# Patient Record
Sex: Male | Born: 1935 | Race: White | Hispanic: No | Smoking: Never smoker
Health system: Southern US, Community
[De-identification: ages and names within clinical notes are randomized; demographics above are authoritative.]

## PROBLEM LIST (undated history)

## (undated) ENCOUNTER — Emergency Department (HOSPITAL_COMMUNITY): Discharge status: Against Medical Advice | Payer: Self-pay

## (undated) DIAGNOSIS — I10 Essential (primary) hypertension: Secondary | ICD-10-CM

## (undated) DIAGNOSIS — Z8619 Personal history of other infectious and parasitic diseases: Secondary | ICD-10-CM

## (undated) DIAGNOSIS — Z953 Presence of xenogenic heart valve: Secondary | ICD-10-CM

## (undated) DIAGNOSIS — K649 Unspecified hemorrhoids: Secondary | ICD-10-CM

## (undated) DIAGNOSIS — K219 Gastro-esophageal reflux disease without esophagitis: Secondary | ICD-10-CM

## (undated) DIAGNOSIS — N400 Enlarged prostate without lower urinary tract symptoms: Secondary | ICD-10-CM

## (undated) DIAGNOSIS — N183 Chronic kidney disease, stage 3 unspecified: Secondary | ICD-10-CM

## (undated) DIAGNOSIS — D649 Anemia, unspecified: Secondary | ICD-10-CM

## (undated) DIAGNOSIS — I371 Nonrheumatic pulmonary valve insufficiency: Secondary | ICD-10-CM

## (undated) DIAGNOSIS — I351 Nonrheumatic aortic (valve) insufficiency: Secondary | ICD-10-CM

## (undated) DIAGNOSIS — E785 Hyperlipidemia, unspecified: Secondary | ICD-10-CM

## (undated) DIAGNOSIS — I712 Thoracic aortic aneurysm, without rupture: Secondary | ICD-10-CM

## (undated) DIAGNOSIS — I7121 Aneurysm of the ascending aorta, without rupture: Secondary | ICD-10-CM

## (undated) DIAGNOSIS — I701 Atherosclerosis of renal artery: Secondary | ICD-10-CM

## (undated) DIAGNOSIS — Z951 Presence of aortocoronary bypass graft: Secondary | ICD-10-CM

## (undated) DIAGNOSIS — M129 Arthropathy, unspecified: Secondary | ICD-10-CM

## (undated) DIAGNOSIS — R600 Localized edema: Secondary | ICD-10-CM

## (undated) DIAGNOSIS — I340 Nonrheumatic mitral (valve) insufficiency: Secondary | ICD-10-CM

## (undated) DIAGNOSIS — K5792 Diverticulitis of intestine, part unspecified, without perforation or abscess without bleeding: Secondary | ICD-10-CM

## (undated) HISTORY — PX: SHOULDER SURGERY: SHX246

## (undated) HISTORY — PX: SHOULDER ARTHROSCOPY WITH ROTATOR CUFF REPAIR: SHX5685

## (undated) HISTORY — DX: Atherosclerosis of renal artery: I70.1

## (undated) HISTORY — DX: Aneurysm of the ascending aorta, without rupture: I71.21

## (undated) HISTORY — DX: Nonrheumatic mitral (valve) insufficiency: I34.0

## (undated) HISTORY — DX: Diverticulitis of intestine, part unspecified, without perforation or abscess without bleeding: K57.92

## (undated) HISTORY — DX: Chronic kidney disease, stage 3 (moderate): N18.3

## (undated) HISTORY — DX: Essential (primary) hypertension: I10

## (undated) HISTORY — DX: Chronic kidney disease, stage 3 unspecified: N18.30

## (undated) HISTORY — DX: Nonrheumatic aortic (valve) insufficiency: I35.1

## (undated) HISTORY — PX: ELBOW SURGERY: SHX618

## (undated) HISTORY — PX: OTHER SURGICAL HISTORY: SHX169

## (undated) HISTORY — DX: Personal history of other infectious and parasitic diseases: Z86.19

## (undated) HISTORY — DX: Gastro-esophageal reflux disease without esophagitis: K21.9

## (undated) HISTORY — DX: Localized edema: R60.0

## (undated) HISTORY — DX: Hyperlipidemia, unspecified: E78.5

## (undated) HISTORY — PX: CARDIAC CATHETERIZATION: SHX172

## (undated) HISTORY — DX: Anemia, unspecified: D64.9

## (undated) HISTORY — DX: Arthropathy, unspecified: M12.9

## (undated) HISTORY — DX: Thoracic aortic aneurysm, without rupture: I71.2

## (undated) HISTORY — DX: Nonrheumatic pulmonary valve insufficiency: I37.1

## (undated) HISTORY — DX: Benign prostatic hyperplasia without lower urinary tract symptoms: N40.0

## (undated) HISTORY — DX: Unspecified hemorrhoids: K64.9

---

## 2003-04-03 ENCOUNTER — Encounter: Admission: RE | Admit: 2003-04-03 | Discharge: 2003-04-03 | Payer: Self-pay | Admitting: Family Medicine

## 2003-08-28 ENCOUNTER — Encounter: Admission: RE | Admit: 2003-08-28 | Discharge: 2003-08-28 | Payer: Self-pay | Admitting: Family Medicine

## 2003-09-25 ENCOUNTER — Encounter: Admission: RE | Admit: 2003-09-25 | Discharge: 2003-09-25 | Payer: Self-pay | Admitting: Family Medicine

## 2004-08-26 ENCOUNTER — Ambulatory Visit: Payer: Self-pay | Admitting: Family Medicine

## 2005-08-23 ENCOUNTER — Ambulatory Visit: Payer: Self-pay | Admitting: Sports Medicine

## 2005-11-24 ENCOUNTER — Ambulatory Visit: Payer: Self-pay | Admitting: Family Medicine

## 2006-02-15 ENCOUNTER — Ambulatory Visit: Payer: Self-pay | Admitting: Family Medicine

## 2006-03-02 ENCOUNTER — Ambulatory Visit: Payer: Self-pay | Admitting: Sports Medicine

## 2006-06-06 ENCOUNTER — Ambulatory Visit: Payer: Self-pay | Admitting: Family Medicine

## 2006-10-25 ENCOUNTER — Ambulatory Visit: Payer: Self-pay | Admitting: Family Medicine

## 2006-11-01 ENCOUNTER — Ambulatory Visit: Payer: Self-pay | Admitting: Family Medicine

## 2006-12-30 ENCOUNTER — Ambulatory Visit: Payer: Self-pay | Admitting: Family Medicine

## 2007-02-02 DIAGNOSIS — I10 Essential (primary) hypertension: Secondary | ICD-10-CM | POA: Insufficient documentation

## 2007-02-07 ENCOUNTER — Telehealth: Payer: Self-pay | Admitting: *Deleted

## 2007-02-08 ENCOUNTER — Ambulatory Visit: Payer: Self-pay | Admitting: Family Medicine

## 2007-02-08 DIAGNOSIS — I10 Essential (primary) hypertension: Secondary | ICD-10-CM | POA: Insufficient documentation

## 2007-06-06 DIAGNOSIS — Z8619 Personal history of other infectious and parasitic diseases: Secondary | ICD-10-CM

## 2007-06-06 HISTORY — DX: Personal history of other infectious and parasitic diseases: Z86.19

## 2007-07-05 ENCOUNTER — Ambulatory Visit: Payer: Self-pay | Admitting: Gastroenterology

## 2008-09-20 ENCOUNTER — Encounter (INDEPENDENT_AMBULATORY_CARE_PROVIDER_SITE_OTHER): Payer: Self-pay | Admitting: *Deleted

## 2008-09-20 ENCOUNTER — Ambulatory Visit: Payer: Self-pay

## 2009-04-17 ENCOUNTER — Ambulatory Visit: Payer: Self-pay | Admitting: Unknown Physician Specialty

## 2009-07-18 ENCOUNTER — Ambulatory Visit: Payer: Self-pay | Admitting: Urology

## 2009-12-06 HISTORY — PX: COLONOSCOPY: SHX174

## 2010-06-17 ENCOUNTER — Ambulatory Visit: Payer: Self-pay | Admitting: Gastroenterology

## 2011-01-05 NOTE — Progress Notes (Signed)
Summary: RETURN CALL  Phone Note Call from Patient Call back at Work Phone [phone]   Reason for Call: Talk to Nurse Summary of Call: RETURNING [facility]'S CALL Initial call taken by: ERIN LEVAN,  February 07, 2007 4:08 PM

## 2011-01-05 NOTE — Assessment & Plan Note (Signed)
Summary: cough several weeks/ls   Vital Signs:  Patient Profile:   75 Years Old Male Weight:      167 pounds Temp:     96.7 degrees F Pulse rate:   67 / minute BP sitting:   170 / 86  Pt. in pain?   no  Vitals Entered By: [place] (February 08, 2007 10:36 AM)              Is Patient Diabetic? No   Acute Visit History:      The patient complains of cough, fever, musculoskeletal symptoms, nasal discharge, sinus problems, and sore throat.  These symptoms began 2 weeks ago.  He denies chest pain.  Other comments include: tried tylenol, rx cough med. unsure of name.        The cough interferes with his sleep.  The character of the cough is described as productive.        'Cold' or URI symptoms have been present with the sore throat.  There is no history of dysphagia, drooling, or recent exposure to strep.        He complains of ears being blocked and nasal congestion.           Past Medical History:    Reviewed history from 02/02/2007 and no changes required:       ? Supracondylar fracture as a child, hallux valgus, Right Rotator Cuff Repair-2003       Hypertension    Risk Factors:  Tobacco use:  never    Physical Exam  General:     Well-developed,well-nourished,in no acute distress; alert,appropriate and cooperative throughout examination Head:     Normocephalic and atraumatic without obvious abnormalities. No apparent alopecia or balding. Ears:     External ear exam shows no significant lesions or deformities.  Otoscopic examination reveals clear canals, tympanic membranes are intact bilaterally without bulging, retraction, inflammation or discharge. Hearing is grossly normal bilaterally. Nose:     no external deformity, no mucosal pallor, and nasal dischargemucosal pallor.   Mouth:     no exudates and pharyngeal erythema.   Neck:     No deformities, masses, or tenderness noted. Lungs:     Normal respiratory effort, chest expands symmetrically. Lungs  are clear to auscultation, no crackles or wheezes. Heart:     Normal rate and regular rhythm. S1 and S2 normal without gallop, murmur, click, rub or other extra sounds.    Impression & Recommendations:  Problem # 1:  INFLUENZA (ICD-487.1) pt likely with flu now haing lingering effects. since greater than 2 weeks will give supportive therapy. Abx for continued productive cough and cough suppressant Orders: [place]- Est Level  3 [id number])   Problem # 2:  HYPERTENSION (ICD-401.9) Assessment: Deteriorated poorly controlled last few visits.  Will add Norvasc 5mg .     Patient Instructions: 1)  Check your Blood Pressure regularly. If it is above: you should make an appointment. 2)  Get plenty of rest, drink lots of clear liquids, and use tylenol or Ibuprophen for fever and comfort. return in 7-10 days if you're not better:sooner if you're feeliong worse.

## 2011-01-05 NOTE — Progress Notes (Signed)
Summary: [employer] REQUEST  Phone Note Call from Patient Call back at Home Phone [phone] Call back at Work Phone [phone]   Reason for Call: Talk to Nurse Summary of Call: PT NEEDS [facility] Initial call taken by: ERIN LEVAN,  February 07, 2007 3:13 PM  Follow-up for Phone Call        Appt Scheduled 02/08/07 at 10:30

## 2011-01-05 NOTE — Miscellaneous (Signed)
Summary: Orders Update   Clinical Lists Changes  Orders: Added new Service order of No Charge Patient Arrived (NCPA0) (NCPA0) - Signed     Arrived the wrong pt, no charge

## 2011-09-30 ENCOUNTER — Ambulatory Visit: Payer: Self-pay | Admitting: Orthopedic Surgery

## 2014-06-20 DIAGNOSIS — I359 Nonrheumatic aortic valve disorder, unspecified: Secondary | ICD-10-CM | POA: Insufficient documentation

## 2016-10-26 ENCOUNTER — Ambulatory Visit (INDEPENDENT_AMBULATORY_CARE_PROVIDER_SITE_OTHER): Payer: Medicare Other | Admitting: Internal Medicine

## 2016-10-26 ENCOUNTER — Encounter: Payer: Self-pay | Admitting: Internal Medicine

## 2016-10-26 ENCOUNTER — Encounter (INDEPENDENT_AMBULATORY_CARE_PROVIDER_SITE_OTHER): Payer: Self-pay

## 2016-10-26 VITALS — BP 170/82 | HR 62 | Ht 67.0 in | Wt 182.8 lb

## 2016-10-26 DIAGNOSIS — I351 Nonrheumatic aortic (valve) insufficiency: Secondary | ICD-10-CM

## 2016-10-26 DIAGNOSIS — I38 Endocarditis, valve unspecified: Secondary | ICD-10-CM | POA: Diagnosis not present

## 2016-10-26 DIAGNOSIS — Z79899 Other long term (current) drug therapy: Secondary | ICD-10-CM | POA: Diagnosis not present

## 2016-10-26 DIAGNOSIS — I340 Nonrheumatic mitral (valve) insufficiency: Secondary | ICD-10-CM

## 2016-10-26 DIAGNOSIS — I10 Essential (primary) hypertension: Secondary | ICD-10-CM | POA: Diagnosis not present

## 2016-10-26 MED ORDER — ENALAPRIL MALEATE 20 MG PO TABS: 20.0000 mg | ORAL_TABLET | Freq: Two times a day (BID) | ORAL | 3 refills | Status: DC

## 2016-10-26 NOTE — Patient Instructions (Signed)
Medication Instructions:  Your physician has recommended you make the following change in your medication:  1- START taking your Enalapril 20mg  by mouth twice a day.   Labwork: Your physician recommends that you return for lab work in: [place] 1 WEEK ([place].)   Testing/Procedures: We will request echo results from your primary care physician.   Follow-Up: Your physician recommends that you schedule a follow-up appointment in: 3 MONTHS WITH DR END.   If you need a refill on your cardiac medications before your next appointment, please call your pharmacy.

## 2016-10-26 NOTE — Progress Notes (Signed)
New Outpatient Visit Date: 10/26/2016  Referring Provider: Vista Mink, FNP [facility]  Chief Complaint: Abnormal echocardiogram  HPI:  Rodney Wiley is a 80 y.o. year-old male with history of hypertension and aortic regurgitation followed by Dr. Ubaldo Glassing, who has been referred by Ms. Lavena Bullion for evaluation of aortic regurgitation.  The patient reports feeling well without chest pain, shortness of breath, palpitations, lightheadedness, orthopnea, and PND. The patient remain very active in and around his house.  He is able to mow his yard with a push mower without any difficulty, though he notes that he has slowed down gradually over the last 2-3 years. He has intermittent swelling of the left ankle when he has been on his feet for several hours.  He notes that he injured the left ankle many years ago and had issues with swelling for over a year after the injury.  He denies a history of DVT.  The patient was evaluated by Ms. Lavena Bullion this fall, at which time echo and carotid Dopplers were performed.  Echocardiogram was notable for LVH with LVEF 62%.  Some degree of mitral regurgitation was well as moderate to severe aortic insufficiency were noted (full report and images are not available).  He does not check his blood pressure regularly at home but reports being compliant with his antihypertensive regimen. He does not regularly check his blood pressure at home.  --------------------------------------------------------------------------------------------------  Cardiovascular History & Procedures: Cardiovascular Problems:  Aortic regurgitation  Mitral regurgitation  Risk Factors:  Hypertension, male gender, and age > 44  Cath/PCI:  None  CV Surgery:  None  EP Procedures and Devices:  None  Non-Invasive Evaluation(s):  Transthoracic echo (09/2016, [facility]): Full report not available.  Reportedly showed LVH with LVEF 41% and diastolic  dysfunction.  MR and moderate to severe AI present with sclerotic aortic valve.  Carotid Doppler (10/201&): Full report not available.  Reportedly showed 1-39% stenosis bilaterally.  Transthoracic echo (02/18/16, [facility]): Normal LV and RV size and function.  LVEF > 55%.  Moderate AI; mild MR and TR.  Recent CV Pertinent Labs: No results found for: CHOL, HDL, LDLCALC, LDLDIRECT, TRIG, CHOLHDL, INR, BNP, K, MG, BUN, CREATININE  --------------------------------------------------------------------------------------------------  Past Medical History:  Diagnosis Date  . Arthropathy   . Cardiac murmur   . Cardiomegaly   . Essential hypertension   . GERD (gastroesophageal reflux disease)   . Hemorrhoids   . Nonrheumatic aortic (valve) insufficiency   . Nonrheumatic mitral (valve) insufficiency   . Nonrheumatic pulmonary valve insufficiency     Past Surgical History:  Procedure Laterality Date  . ELBOW SURGERY    . hemorhoidectomy    . SHOULDER SURGERY      Outpatient Encounter Prescriptions as of 10/26/2016  Medication Sig  . amLODipine (NORVASC) 10 MG tablet Take 10 mg by mouth daily.   . Cholecalciferol (VITAMIN D3) 2000 units capsule Take 2,000 Units by mouth daily.   . enalapril (VASOTEC) 20 MG tablet Take 20 mg by mouth daily.  . ferrous sulfate 325 (65 FE) MG EC tablet Take 325 mg by mouth daily with breakfast.  . metoprolol succinate (TOPROL-XL) 100 MG 24 hr tablet Take 100 mg by mouth daily.   . naproxen (NAPRELAN) 500 MG 24 hr tablet Take 500 mg by mouth daily with breakfast.   . Omega-3 Fatty Acids (FISH OIL) 1200 MG CAPS Take by mouth daily.  . [DISCONTINUED] enalapril (VASOTEC) 20 MG tablet Take by mouth.   No facility-administered encounter  medications on file as of 10/26/2016.     Allergies: Patient has no known allergies.  Social History   Social History  . Marital status: Single    Spouse name: N/A  . Number of children: N/A  . Years of  education: N/A   Occupational History  . Not on file.   Social History Main Topics  . Smoking status: Never Smoker  . Smokeless tobacco: Never Used  . Alcohol use No  . Drug use: No  . Sexual activity: Not on file   Other Topics Concern  . Not on file   Social History Narrative  . No narrative on file    Family History  Problem Relation Age of Onset  . Hypertension Mother   . Heart attack Paternal Grandmother   . Stroke Paternal Grandfather   . Heart Problems Son   . Heart Problems Son     Review of Systems: A 12-system review of systems was performed and was negative except as noted in the HPI.  --------------------------------------------------------------------------------------------------  Physical Exam: BP (!) 146/74 (BP Location: Right Arm, Patient Position: Sitting, Cuff Size: Normal)   Pulse 62   Ht 5\' 7"  (1.702 m)   Wt 182 lb 12 oz (82.9 kg)   BMI 28.62 kg/m   Repeat BP 170/82  General:  Well-developed, well-nourished man, seated comfortably in the exam room. HEENT: No conjunctival pallor or scleral icterus.  Moist mucous membranes.  OP clear. Neck: Supple without lymphadenopathy, thyromegaly, JVD, or HJR.  No carotid bruit. Lungs: Normal work of breathing.  Clear to auscultation bilaterally without wheezes or crackles. Heart: Regular rate and rhythm with 2/6 holosystolic murmur loudest at the base and 2/6 early diastolic murmur loudest at the LUSB.  No rubs or gallops.  Non-displaced PMI. Abd: Bowel sounds present.  Soft, NT/ND without hepatosplenomegaly Ext: No lower extremity edema.  Radial, PT, and DP pulses are 2+ bilaterally Skin: warm and dry without rash Neuro: CNIII-XII intact.  Strength and fine-touch sensation intact in upper and lower extremities bilaterally. Psych: Normal mood and affect.  EKG:  Normal sinus rhythm with 1st degree AV block, left axis deviation, and poor R-wave progression.  Outside labs (09/24/16): TSH: 3.2  Lipid  panel: Total cholesterol 192, HDL 33, triglycerides 305, LDL 118  CMP: Sodium 140, potassium 4.1, chloride 106, CO2 23, BUN 23, creatinine 1.55, glucose 102, calcium 9.2, AST 32, ALT 38, alkaline phosphatase 44, total bilirubin 0.7, total protein 7.1, albumin 4.1  CBC White blood cell count 4.8, hemoglobin 13.4, hematocrit 39.0, platelets 157  --------------------------------------------------------------------------------------------------  ASSESSMENT AND PLAN: Valvular heart disease Patient has a history of mitral (mild) and aortic regurgitation (at least moderate), that has been followed for several years by Dr. Ubaldo Glassing.  Most recent echocardiogram last month performed at [facility] suggests potential progression of aortic regurgitation. However, formal report and images are not available for direct comparison. Mr. Boody appears euvolemic and well compensated at this time. We discussed the natural history of aortic and mitral regurgitation and the need for blood pressure control and periodic monitoring to evaluate for signs of developing left ventricular dysfunction/dilation. We will escalate his blood pressure regimen, as below, and request a copy of the report as well as the images from his recent echocardiogram at [facility]. If we are unable to obtain these, we may need to repeat an echocardiogram for the patient returns for follow-up.  Hypertension Blood pressure is mildly to moderately elevated today. In the setting of at  least moderate AI, it is important to maintain adequate blood pressure control. We will therefore increase enalapril from 20 mg daily to 20 mg twice a day. Recent outside labs were notable for some mild renal insufficiency; we will recheck a BMP today and in one week to ensure that his renal function and potassium have not worsened.  Follow-up: Return to clinic in 3 months.  Nelva Bush, MD 10/26/2016 10:58 AM

## 2016-10-27 ENCOUNTER — Other Ambulatory Visit: Payer: Self-pay | Admitting: *Deleted

## 2016-10-27 DIAGNOSIS — I340 Nonrheumatic mitral (valve) insufficiency: Secondary | ICD-10-CM | POA: Insufficient documentation

## 2016-10-27 DIAGNOSIS — I351 Nonrheumatic aortic (valve) insufficiency: Secondary | ICD-10-CM | POA: Insufficient documentation

## 2016-10-27 DIAGNOSIS — Z79899 Other long term (current) drug therapy: Secondary | ICD-10-CM

## 2016-10-27 DIAGNOSIS — R7989 Other specified abnormal findings of blood chemistry: Secondary | ICD-10-CM

## 2016-10-27 LAB — BASIC METABOLIC PANEL
BUN/Creatinine Ratio: 17 (ref 10–24)
BUN: 27 mg/dL (ref 8–27)
CO2: 21 mmol/L (ref 18–29)
Calcium: 9.2 mg/dL (ref 8.6–10.2)
Chloride: 102 mmol/L (ref 96–106)
Creatinine, Ser: 1.55 mg/dL — ABNORMAL HIGH (ref 0.76–1.27)
GFR calc Af Amer: 49 mL/min/{1.73_m2} — ABNORMAL LOW (ref >59)
GFR calc non Af Amer: 42 mL/min/{1.73_m2} — ABNORMAL LOW (ref >59)
Glucose: 108 mg/dL — ABNORMAL HIGH (ref 65–99)
Potassium: 4.1 mmol/L (ref 3.5–5.2)
Sodium: 140 mmol/L (ref 134–144)

## 2016-11-02 ENCOUNTER — Other Ambulatory Visit (INDEPENDENT_AMBULATORY_CARE_PROVIDER_SITE_OTHER): Payer: Medicare Other

## 2016-11-02 DIAGNOSIS — I10 Essential (primary) hypertension: Secondary | ICD-10-CM

## 2016-11-02 DIAGNOSIS — I351 Nonrheumatic aortic (valve) insufficiency: Secondary | ICD-10-CM | POA: Diagnosis not present

## 2016-11-03 LAB — BASIC METABOLIC PANEL
BUN/Creatinine Ratio: 21 (ref 10–24)
BUN: 33 mg/dL — ABNORMAL HIGH (ref 8–27)
CO2: 20 mmol/L (ref 18–29)
Calcium: 9 mg/dL (ref 8.6–10.2)
Chloride: 102 mmol/L (ref 96–106)
Creatinine, Ser: 1.54 mg/dL — ABNORMAL HIGH (ref 0.76–1.27)
GFR calc Af Amer: 49 mL/min/{1.73_m2} — ABNORMAL LOW (ref >59)
GFR calc non Af Amer: 43 mL/min/{1.73_m2} — ABNORMAL LOW (ref >59)
Glucose: 104 mg/dL — ABNORMAL HIGH (ref 65–99)
Potassium: 4.1 mmol/L (ref 3.5–5.2)
Sodium: 139 mmol/L (ref 134–144)

## 2016-11-15 ENCOUNTER — Encounter: Payer: Self-pay | Admitting: Internal Medicine

## 2016-11-16 ENCOUNTER — Telehealth: Payer: Self-pay | Admitting: *Deleted

## 2016-11-16 DIAGNOSIS — I351 Nonrheumatic aortic (valve) insufficiency: Secondary | ICD-10-CM

## 2016-11-16 DIAGNOSIS — I340 Nonrheumatic mitral (valve) insufficiency: Secondary | ICD-10-CM

## 2016-11-16 NOTE — Telephone Encounter (Signed)
Dr End reviewed records from Dr Malachy Mood Lindley's office.  We had requested CD copy of images of echocardiogram which their office was not able to provide or did not have.  Dr End would like patient to have a repeat echo at patient's earliest convenience.  Patient notified of this and agreeable to repeat echo. Call transferred to scheduler. Schedule for 12/13/16.

## 2016-12-14 ENCOUNTER — Other Ambulatory Visit: Payer: Self-pay

## 2016-12-14 ENCOUNTER — Ambulatory Visit (INDEPENDENT_AMBULATORY_CARE_PROVIDER_SITE_OTHER): Payer: Medicare HMO

## 2016-12-14 DIAGNOSIS — I351 Nonrheumatic aortic (valve) insufficiency: Secondary | ICD-10-CM

## 2016-12-14 DIAGNOSIS — I340 Nonrheumatic mitral (valve) insufficiency: Secondary | ICD-10-CM

## 2016-12-16 ENCOUNTER — Telehealth: Payer: Self-pay | Admitting: Internal Medicine

## 2016-12-16 DIAGNOSIS — I10 Essential (primary) hypertension: Secondary | ICD-10-CM

## 2016-12-16 DIAGNOSIS — N289 Disorder of kidney and ureter, unspecified: Secondary | ICD-10-CM

## 2016-12-16 NOTE — Telephone Encounter (Signed)
Echo shows mild to moderate AI but significant dilation of the ascending aorta (up to 4.9 cm above the sinotubular junction).  Patient reports feeling well.  We discussed further testing options and will obtain an MRA of the chest to further evaluate the aorta.  I prefer this over CTA due to his CKD.  I asked the patient to monitor his BP at home and to alert [place] if it is consistently above 140/90.  We will follow-up as planned in the office next month.  Nelva Bush, MD [facility] Pager: [phone]

## 2016-12-17 ENCOUNTER — Other Ambulatory Visit: Payer: Self-pay

## 2016-12-17 DIAGNOSIS — I712 Thoracic aortic aneurysm, without rupture, unspecified: Secondary | ICD-10-CM

## 2016-12-17 NOTE — Telephone Encounter (Signed)
Pt calling back to report his readings.  930 pm  150/85  830 am 165/80 Would also like to know if he needs to keep a record for over the weekend Please advise

## 2016-12-17 NOTE — Telephone Encounter (Signed)
S/w pt. He reports BPs listed below were taken 5 minutes after taking BP meds. Advised pt to check pressure 1 - 1.5 hours after medication to evaluate effectiveness. He will call Monday with today through Sunday readings. Pt agreeable to MRA of the chest January 22, arrival 1:45pm at [facility], Entrance A. He has someone that can go with him and understands to avoid a heavy meal that day.

## 2016-12-20 NOTE — Telephone Encounter (Signed)
Called to check on patient to ask about BP readings. Patient gave readings as follows: Friday 1/12 - 147/80  1/13 - 11 am- 139/69, 11 pm  160/85  1/14 - 10:15 am 149/89, 10 pm 200/90  1/15 - 182/91.  Patient denies h/a, blurred vision, chest pain, palpations, or dizziness. Patient stated "I really feel pretty good believe it or not." Patient stated he takes his metoprolol and amlodipine around 0730 daily. Takes his enalapril around 0730 and then at 6pm.  Will route to Dr End for advice.

## 2016-12-21 MED ORDER — CARVEDILOL 12.5 MG PO TABS: 12.5000 mg | ORAL_TABLET | Freq: Two times a day (BID) | ORAL | 3 refills | Status: DC

## 2016-12-21 NOTE — Telephone Encounter (Signed)
S/w patient. Gave him Dr Darnelle Bos advice. Patient verbalized understanding to stop metoprolol and start carvedilol 12.5 mg by mouth twice a day. He is aware someone from scheduling will call to schedule a renal ultrasound.  He will continue to monitor BP and call us if needed. Rx sent to patient's verified pharmacy.  Rx entered into EPIC x3 attempts and printed each time. [place] and they did not receive. Rx called in verbally to pharmacist.

## 2016-12-21 NOTE — Telephone Encounter (Signed)
Blood pressures are variable but overall still quite high. He is on maximum doses of amlodipine and enalapril already. Can we switch him from metoprolol to carvedilol 12.5 mg BID and also set him up for a renal artery Doppler to exclude renal artery stenosis (given his difficult to control BP and chronic kidney disease)? Thanks.  Gerald Stabs

## 2016-12-26 ENCOUNTER — Encounter: Payer: Self-pay | Admitting: Internal Medicine

## 2016-12-27 ENCOUNTER — Ambulatory Visit (HOSPITAL_COMMUNITY)
Admission: RE | Admit: 2016-12-27 | Discharge: 2016-12-27 | Disposition: A | Discharge status: Home / Self Care | Payer: Medicare HMO | Source: Ambulatory Visit | Attending: Internal Medicine | Admitting: Internal Medicine

## 2016-12-27 DIAGNOSIS — I712 Thoracic aortic aneurysm, without rupture, unspecified: Secondary | ICD-10-CM

## 2016-12-27 DIAGNOSIS — N189 Chronic kidney disease, unspecified: Secondary | ICD-10-CM | POA: Diagnosis not present

## 2016-12-28 ENCOUNTER — Telehealth: Payer: Self-pay | Admitting: Internal Medicine

## 2016-12-28 DIAGNOSIS — Z13220 Encounter for screening for lipoid disorders: Secondary | ICD-10-CM

## 2016-12-28 DIAGNOSIS — Z79899 Other long term (current) drug therapy: Secondary | ICD-10-CM

## 2016-12-28 NOTE — Telephone Encounter (Signed)
I spoke with Mr. Villalpando regarding the results of his thoracic MRA, which demonstrated moderate enlargement of the thoracic aorta (up to 4.9 cm above the sinotubular junction). He remains asymptomatic. He notes that his blood pressure over the last few days has been much improved ranging from 123-135/75-80. We have agreed to continue his current medications. He is scheduled to return for renal artery Doppler ultrasound on 01/13/17. We will also check a fasting lipid panel and a LT at that time in anticipation of adding statin therapy to hopefully help prevent progression of his TAA.  Nelva Bush, MD [facility] Pager: [phone]

## 2016-12-29 NOTE — Telephone Encounter (Signed)
S/w patient. He is aware to be fasting for blood work which we will draw here in the office immediately following his renal dopplers.

## 2017-01-13 ENCOUNTER — Telehealth: Payer: Self-pay | Admitting: *Deleted

## 2017-01-13 ENCOUNTER — Other Ambulatory Visit (INDEPENDENT_AMBULATORY_CARE_PROVIDER_SITE_OTHER): Payer: Medicare HMO | Admitting: *Deleted

## 2017-01-13 ENCOUNTER — Ambulatory Visit: Payer: Medicare HMO

## 2017-01-13 DIAGNOSIS — Z13220 Encounter for screening for lipoid disorders: Secondary | ICD-10-CM | POA: Diagnosis not present

## 2017-01-13 DIAGNOSIS — N289 Disorder of kidney and ureter, unspecified: Secondary | ICD-10-CM

## 2017-01-13 DIAGNOSIS — Z79899 Other long term (current) drug therapy: Secondary | ICD-10-CM | POA: Diagnosis not present

## 2017-01-13 DIAGNOSIS — I10 Essential (primary) hypertension: Secondary | ICD-10-CM

## 2017-01-13 NOTE — Telephone Encounter (Signed)
Patient was in the office this morning for lab work and renal ultrasound and brought by BP readings he recorded at home. Readings are as follows: 12/27/16 123/80 at 1030   135/75 at 8:30pm 12/29/16 146/76 at 1030am   142/76 at 11pm 12/30/16 126/70 at 0930am   162/72 at 8:30pm 12/31/16 121/78 at 11am   131/66 at 10pm 01/01/17 135/75 at 11am 01/02/17 125/65 at 10am   135/75 at 2030pm 01/03/17 138/78 at 10am 01/04/17 128/75 at 8:30pm 01/05/17 133/68 at 9:30am 01/06/17 127/72 at 10:30am 01/07/17 135/60 at 9:45am 01/08/17 136/76 at 10am 01/09/17 138/78 at 12pm 01/10/17 138/78 at 10am 01/11/17 128/60 at 07:30 am   S/w patient on the phone this afternoon. Advised that his readings were good readings. He does not have any complaints at this time to report. Let him know we will be in touch with results from testing today. Patient aware of next appt with Dr End on 01/26/17.

## 2017-01-14 LAB — LIPID PANEL
Chol/HDL Ratio: 6.2 ratio units — ABNORMAL HIGH (ref 0.0–5.0)
Cholesterol, Total: 186 mg/dL (ref 100–199)
HDL: 30 mg/dL — ABNORMAL LOW (ref >39)
LDL Calculated: 95 mg/dL (ref 0–99)
Triglycerides: 305 mg/dL — ABNORMAL HIGH (ref 0–149)
VLDL Cholesterol Cal: 61 mg/dL — ABNORMAL HIGH (ref 5–40)

## 2017-01-14 LAB — HEPATIC FUNCTION PANEL
ALT: 25 IU/L (ref 0–44)
AST: 19 IU/L (ref 0–40)
Albumin: 4.1 g/dL (ref 3.5–4.8)
Alkaline Phosphatase: 54 IU/L (ref 39–117)
Bilirubin Total: 0.7 mg/dL (ref 0.0–1.2)
Bilirubin, Direct: 0.19 mg/dL (ref 0.00–0.40)
Total Protein: 6.9 g/dL (ref 6.0–8.5)

## 2017-01-17 ENCOUNTER — Other Ambulatory Visit: Payer: Self-pay | Admitting: *Deleted

## 2017-01-17 MED ORDER — ATORVASTATIN CALCIUM 20 MG PO TABS: 20.0000 mg | ORAL_TABLET | Freq: Every day | ORAL | 3 refills | Status: DC

## 2017-01-26 ENCOUNTER — Encounter: Payer: Self-pay | Admitting: Internal Medicine

## 2017-01-26 ENCOUNTER — Ambulatory Visit (INDEPENDENT_AMBULATORY_CARE_PROVIDER_SITE_OTHER): Payer: Medicare HMO | Admitting: Internal Medicine

## 2017-01-26 VITALS — BP 130/62 | HR 58 | Ht 67.0 in | Wt 183.2 lb

## 2017-01-26 DIAGNOSIS — I701 Atherosclerosis of renal artery: Secondary | ICD-10-CM | POA: Diagnosis not present

## 2017-01-26 DIAGNOSIS — E785 Hyperlipidemia, unspecified: Secondary | ICD-10-CM | POA: Diagnosis not present

## 2017-01-26 DIAGNOSIS — I712 Thoracic aortic aneurysm, without rupture, unspecified: Secondary | ICD-10-CM

## 2017-01-26 DIAGNOSIS — Z79899 Other long term (current) drug therapy: Secondary | ICD-10-CM | POA: Diagnosis not present

## 2017-01-26 DIAGNOSIS — Z13220 Encounter for screening for lipoid disorders: Secondary | ICD-10-CM | POA: Diagnosis not present

## 2017-01-26 DIAGNOSIS — I10 Essential (primary) hypertension: Secondary | ICD-10-CM | POA: Diagnosis not present

## 2017-01-26 DIAGNOSIS — I351 Nonrheumatic aortic (valve) insufficiency: Secondary | ICD-10-CM

## 2017-01-26 MED ORDER — ASPIRIN EC 81 MG PO TBEC: 81.0000 mg | DELAYED_RELEASE_TABLET | Freq: Every day | ORAL | 3 refills | Status: DC

## 2017-01-26 MED ORDER — CARVEDILOL 25 MG PO TABS: 25.0000 mg | ORAL_TABLET | Freq: Two times a day (BID) | ORAL | 3 refills | Status: DC

## 2017-01-26 NOTE — Patient Instructions (Addendum)
Medication Instructions:  Your physician has recommended you make the following change in your medication:  1- STOP taking Metoprolol. 2- INCREASE Carvedilol to 25 mg by mouth twice a day. 3- START Aspirin 81 mg by mouth once a day.   Labwork: Your physician recommends that you return for lab work in: [place] ([place], [place]). - On or around March 09, 2017. - [facility] to the [facility]. You will check in at the front desk to the right as you walk into the atrium. Valet Parking is offered if needed. - YOU WILL NEED TO BE FASTING, DO NOT EAT OR DRINK AFTER MIDNIGHT THE MORNING OF THE LABS.   Testing/Procedures: none  Follow-Up: Your physician wants you to follow-up in: 4 MONTHS WITH DR END.  You will receive a reminder letter in the mail two months in advance. If you don't receive a letter, please call our office to schedule the follow-up appointment.   If you need a refill on your cardiac medications before your next appointment, please call your pharmacy.

## 2017-01-26 NOTE — Progress Notes (Signed)
Follow-up Outpatient Visit Date: 01/26/2017  Primary Care Provider: Vista Mink, FNP [place] [id number]  Chief Complaint: Follow-up TAA, aortic regurgitation, and HTN  HPI:  Rodney Wiley is a 81 y.o. year-old male with history of HTN, aortic regurgitation, thoracic aortic aneurysm, and chronic kidney disease who presents for follow-up of TAA, HTN, and AI. The patient reports feeling well, denying chest pain, shortness of breath, lightheadedness, and orthopnea. He noted mild dependent leg swelling, which has been stable. It has always been worse on the left due to a remote ankle injury. He was recently started on atorvastatin, which he is tolerating well. Review of his medications is notable for Rodney Wiley being on both carvedilol and metoprolol.  --------------------------------------------------------------------------------------------------  Cardiovascular History & Procedures: Cardiovascular Problems:  Thoracic aortic aneurysm  Aortic regurgitation  Mitral regurgitation  Renal artery stenosis  Risk Factors:  Hypertension, hyperlipidemia, male gender, and age > 71  Cath/PCI:  None  CV Surgery:  None  EP Procedures and Devices:  None  Non-Invasive Evaluation(s):  Renal artery Doppler (01/13/17): Aortic atherosclerosis without stenosis or aneurysm. Normal right renal artery. Mild to moderate left renal artery stenosis (1-59%). Normal/symmetric kidney size.  MRA chest (12/27/16): Aneurysmal disease of the ascending aorta, measuring 4.3 cm at the sinus of Valsalva and 4.9 cm above the sinotubular junction.  TTE (12/14/16): Mildly dilated LV with mild LVH. Normal contraction with LVEF 55-60%. Grade 1 diastolic dysfunction. Mild to moderate aortic regurgitation. Dilated aorta, measuring up to 4.9 cm above the sinotubular junction. Mild to moderate mitral regurgitation. Mild left atrial enlargement. Normal RV size and function.  Transthoracic echo  (09/2016, [place]): Full report not available.  Reportedly showed LVH with LVEF 65% and diastolic dysfunction.  MR and moderate to severe AI present with sclerotic aortic valve.  Carotid Doppler (10/201&): Full report not available.  Reportedly showed 1-39% stenosis bilaterally.  Transthoracic echo (02/18/16, [facility]): Normal LV and RV size and function.  LVEF > 55%.  Moderate AI; mild MR and TR.  Recent CV Pertinent Labs: Lab Results  Component Value Date   CHOL 186 01/13/2017   HDL 30 (L) 01/13/2017   LDLCALC 95 01/13/2017   TRIG 305 (H) 01/13/2017   CHOLHDL 6.2 (H) 01/13/2017   K 4.1 11/02/2016   BUN 33 (H) 11/02/2016   CREATININE 1.54 (H) 11/02/2016    Past medical and surgical history were reviewed and updated in EPIC.  Outpatient Encounter Prescriptions as of 01/26/2017  Medication Sig  . amLODipine (NORVASC) 10 MG tablet Take 10 mg by mouth daily.   [name] Kitchen atorvastatin (LIPITOR) 20 MG tablet Take 1 tablet (20 mg total) by mouth daily.  . carvedilol (COREG) 12.5 MG tablet Take 1 tablet (12.5 mg total) by mouth 2 (two) times daily.  . Cholecalciferol (VITAMIN D3) 2000 units capsule Take 2,000 Units by mouth daily.   . enalapril (VASOTEC) 20 MG tablet Take 1 tablet (20 mg total) by mouth 2 (two) times daily.  . ferrous sulfate 325 (65 FE) MG EC tablet Take 325 mg by mouth daily with breakfast.  . metoprolol succinate (TOPROL-XL) 100 MG 24 hr tablet Take 100 mg by mouth daily. Take with or immediately following a meal.  . naproxen (NAPRELAN) 500 MG 24 hr tablet Take 500 mg by mouth daily with breakfast.   . Omega-3 Fatty Acids (FISH OIL) 1200 MG CAPS Take by mouth daily.   No facility-administered encounter medications on file as of 01/26/2017.  Allergies: Patient has no known allergies.  Social History   Social History  . Marital status: Single    Spouse name: N/A  . Number of children: N/A  . Years of education: N/A   Occupational History  .  Not on file.   Social History Main Topics  . Smoking status: Never Smoker  . Smokeless tobacco: Never Used  . Alcohol use No  . Drug use: No  . Sexual activity: Not on file   Other Topics Concern  . Not on file   Social History Narrative  . No narrative on file    Family History  Problem Relation Age of Onset  . Hypertension Mother   . Leukemia Father   . Heart attack Paternal Grandmother   . Stroke Paternal Grandfather   . Heart Problems Son 59  . Heart Problems Son 58    Review of Systems: A 12-system review of systems was performed and was negative except as noted in the HPI.  --------------------------------------------------------------------------------------------------  Physical Exam: BP 130/62 (BP Location: Left Arm, Patient Position: Sitting, Cuff Size: Normal)   Pulse (!) 58   Ht 5\' 7"  (1.702 m)   Wt 183 lb 4 oz (83.1 kg)   BMI 28.70 kg/m   General:  Overweight, elderly man seated comfortably in the exam room. HEENT: No conjunctival pallor or scleral icterus.  Moist mucous membranes.  OP clear. Neck: Supple without lymphadenopathy, thyromegaly, JVD, or HJR.  No carotid bruit. Lungs: Normal work of breathing.  Clear to auscultation bilaterally without wheezes or crackles. Heart: Regular rate and rhythm with 2/6 holosystolic and 1/6 early diastolic murmurs. No rubs or gallops.  Non-displaced PMI. Abd: Bowel sounds present.  Soft, NT/ND without hepatosplenomegaly Ext: No lower extremity edema.  Radial, PT, and DP pulses are 2+ bilaterally. Skin: warm and dry without rash  No results found for: WBC, HGB, HCT, MCV, PLT  Lab Results  Component Value Date   NA 139 11/02/2016   K 4.1 11/02/2016   CL 102 11/02/2016   CO2 20 11/02/2016   BUN 33 (H) 11/02/2016   CREATININE 1.54 (H) 11/02/2016   GLUCOSE 104 (H) 11/02/2016   ALT 25 01/13/2017    Lab Results  Component Value Date   CHOL 186 01/13/2017   HDL 30 (L) 01/13/2017   LDLCALC 95 01/13/2017    TRIG 305 (H) 01/13/2017   CHOLHDL 6.2 (H) 01/13/2017    --------------------------------------------------------------------------------------------------  ASSESSMENT AND PLAN: Thoracic aortic aneurysm Thoracic aortic aneurysm noted by echo and confirmed by MRI, with ascending aorta measuring up to 4.9 cm. Patient is asymptomatic. We have agreed to close surveillance with aggressive blood pressure and lipid control. We will plan to repeat an MRI in late 06/2017 (6 months after previous study). If there is evidence of further enlargement, we will consider surgical referral to discuss repair.  Aortic regurgitation Mild to moderate AI noted by most recent echo. Exam notable for soft early diastolic murmur. The patient has chronic leg edema, which is stable. He otherwise appears euvolemic and well-compensated. We will continue with blood pressure control, as below.  Renal artery stenosis Mild to moderate stenosis of the left renal artery noted on recent Doppler. We will continue with medical management, as we would like to avoid IV contrast given history of CKD. I do not believe the his unilateral RAS would account for chronically elevated creatinine.  Hypertension Blood pressure under better control today. Review of medications today is notable for the patient being on two  beta-blockers. We discussed treatment options and have agreed to discontinue metoprolol and increase carvedilol to 25 mg BID.  Hyperlipidemia Recent lipid panel notable for LDL of 95. Given RAS, aortic atherosclerosis, and TAA, patient was recently started on moderate-intensity statin, which he is tolerating well. We will continue with atorvastatin 20 mg daily have repeat a lipid panel and ALT in about 6 weeks.  Follow-up: Return to clinic in 4 months  Nelva Bush, MD 01/26/2017 10:46 AM

## 2017-01-27 DIAGNOSIS — I701 Atherosclerosis of renal artery: Secondary | ICD-10-CM | POA: Insufficient documentation

## 2017-01-27 DIAGNOSIS — E785 Hyperlipidemia, unspecified: Secondary | ICD-10-CM | POA: Insufficient documentation

## 2017-01-27 DIAGNOSIS — I712 Thoracic aortic aneurysm, without rupture, unspecified: Secondary | ICD-10-CM | POA: Insufficient documentation

## 2017-02-21 ENCOUNTER — Encounter: Payer: Self-pay | Admitting: Internal Medicine

## 2017-02-22 ENCOUNTER — Telehealth: Payer: Self-pay | Admitting: *Deleted

## 2017-02-22 NOTE — Telephone Encounter (Signed)
Rodney Wiley  to [employer], MD       02/21/17 11:22 PM  I have had significant swelling in my feet, especially my left ankle and foot. I have never had this kind of swelling before. Elevation provided little or no relief. My blood pressure reading was 194/90. It has me concerned because these are new symptons. Do you think I need an office visit? I will take my readings again in the am. Thank you.   [name]

## 2017-02-22 NOTE — Telephone Encounter (Signed)
S/w patient.  We discussed avoiding salt intake. Patient stated he's been watching his salt intake for 20 years now and is very conscientious about avoiding salt as much as possible. He will continue to monitor BP and swelling and let us know if BP is consistently above 140/90 or leg swelling is worse than usual.

## 2017-02-22 NOTE — Telephone Encounter (Signed)
Received incoming call from patient. Last evening, patient took evening BP medications around 8 pm as usual.  Around 10:30pm his BP was 194/90.  He says the swelling in his feet and ankles was the most he had even had. Denies SOB, Chest pain, palpitations, dizziness last night or at all. This morning patient says his lower extremities are "almost normal" but still "puffy".  Routinely, his lower extremities will swell throughout the day and then go down at night. He elevates them in the evening and finds improvement. This morning BP 138/74 about 20 min after taking morning BP medications as listed on his med list. Advised for patient to monitor BP, HR, swelling, and additional symptoms and if he continues to see an increase in either to give [place] a call. Patient verbalized understanding.  Routing to Dr End for advice.

## 2017-02-22 NOTE — Telephone Encounter (Signed)
Given elevated blood pressure and increased swelling, I would be most concerned about increased salt intake. Please have [name] continue to check his BP and edema. If the BP is consistently above 140/90 or his leg swelling is worse than usual, he should let us know so that we can discuss medication adjustments. In the meantime, he should avoid salt as much as possible. Thanks.

## 2017-02-22 NOTE — Telephone Encounter (Signed)
Attempted to call patient. No answer. Left message for patient to call back to discuss email.

## 2017-03-03 ENCOUNTER — Ambulatory Visit (INDEPENDENT_AMBULATORY_CARE_PROVIDER_SITE_OTHER): Payer: Medicare HMO | Admitting: Internal Medicine

## 2017-03-03 ENCOUNTER — Encounter: Payer: Self-pay | Admitting: Internal Medicine

## 2017-03-03 VITALS — BP 144/72 | HR 56 | Ht 67.0 in | Wt 184.0 lb

## 2017-03-03 DIAGNOSIS — I712 Thoracic aortic aneurysm, without rupture, unspecified: Secondary | ICD-10-CM

## 2017-03-03 DIAGNOSIS — I340 Nonrheumatic mitral (valve) insufficiency: Secondary | ICD-10-CM

## 2017-03-03 DIAGNOSIS — M7989 Other specified soft tissue disorders: Secondary | ICD-10-CM | POA: Diagnosis not present

## 2017-03-03 DIAGNOSIS — I10 Essential (primary) hypertension: Secondary | ICD-10-CM | POA: Diagnosis not present

## 2017-03-03 DIAGNOSIS — I351 Nonrheumatic aortic (valve) insufficiency: Secondary | ICD-10-CM | POA: Diagnosis not present

## 2017-03-03 DIAGNOSIS — I701 Atherosclerosis of renal artery: Secondary | ICD-10-CM

## 2017-03-03 DIAGNOSIS — N183 Chronic kidney disease, stage 3 unspecified: Secondary | ICD-10-CM

## 2017-03-03 MED ORDER — FUROSEMIDE 40 MG PO TABS: 40.0000 mg | ORAL_TABLET | Freq: Every day | ORAL | 3 refills | Status: DC

## 2017-03-03 NOTE — Patient Instructions (Signed)
Medication Instructions:  Your physician has recommended you make the following change in your medication:  1- START Furosemide 40 mg (1 tablet) by mouth once a day.   Labwork: Your physician recommends that you return for lab work in: TODAY (BMP).   Testing/Procedures: none  Follow-Up: Your physician recommends that you schedule a follow-up appointment in: 2 WEEKS WITH DR END.   If you need a refill on your cardiac medications before your next appointment, please call your pharmacy.

## 2017-03-03 NOTE — Progress Notes (Addendum)
Follow-up Outpatient Visit Date: 03/03/2017  Primary Care Provider: Vista Mink, [place] [address]  Chief Complaint: Leg swelling  HPI:  Rodney Wiley is a 81 y.o. year-old male with history of HTN, aortic regurgitation, thoracic aortic aneurysm, and chronic kidney disease who presents for evaluation of leg swelling. I last saw him just over a month ago, at which time Rodney Wiley was doing well. However, about 3 weeks ago Rodney Wiley developed leg swelling while at the beach with his family. Rodney Wiley had been spending a lot of time working and was on his feet. Rodney Wiley also ate out several times, eating [employer] and fried seafood. Rodney Wiley typically follows a low-salt diet. Rodney Wiley has not changed any of his medications since that time. Rodney Wiley notes a gradual weight increase of about 7 pounds over the last several months. Rodney Wiley denies shortness of breath, orthopnea, PND, chest pain, palpitations, and lightheadedness. Rodney Wiley has been taking his medications as prescribed, including amlodipine. Home blood pressures have been upper normal to elevated ranging from 131-161/76-80. --------------------------------------------------------------------------------------------------  Cardiovascular History & Procedures: Cardiovascular Problems:  Thoracic aortic aneurysm  Aortic regurgitation  Mitral regurgitation  Renal artery stenosis  Risk Factors:  Hypertension, hyperlipidemia, male gender, and age > 51  Cath/PCI:  None  CV Surgery:  None  EP Procedures and Devices:  None  Non-Invasive Evaluation(s):  Renal artery Doppler (01/13/17): Aortic atherosclerosis without stenosis or aneurysm. Normal right renal artery. Mild to moderate left renal artery stenosis (1-59%). Normal/symmetric kidney size.  MRA chest (12/27/16): Aneurysmal disease of the ascending aorta, measuring 4.3 cm at the sinus of Valsalva and 4.9 cm above the sinotubular junction.  TTE (12/14/16): Mildly dilated LV with mild LVH.  Normal contraction with LVEF 55-60%. Grade 1 diastolic dysfunction. Mild to moderate aortic regurgitation. Dilated aorta, measuring up to 4.9 cm above the sinotubular junction. Mild to moderate mitral regurgitation. Mild left atrial enlargement. Normal RV size and function.  Transthoracic echo (09/2016, [place]): Full report not available.  Reportedly showed LVH with LVEF 66% and diastolic dysfunction.  MR and moderate to severe AI present with sclerotic aortic valve.  Carotid Doppler (10/201&): Full report not available.  Reportedly showed 1-39% stenosis bilaterally.  Transthoracic echo (02/18/16, [facility]): Normal LV and RV size and function.  LVEF > 55%.  Moderate AI; mild MR and TR.  Recent CV Pertinent Labs: Lab Results  Component Value Date   CHOL 186 01/13/2017   HDL 30 (L) 01/13/2017   LDLCALC 95 01/13/2017   TRIG 305 (H) 01/13/2017   CHOLHDL 6.2 (H) 01/13/2017   K 4.1 11/02/2016   BUN 33 (H) 11/02/2016   CREATININE 1.54 (H) 11/02/2016    Past medical and surgical history were reviewed and updated in EPIC.  Outpatient Encounter Prescriptions as of 03/03/2017  Medication Sig  . amLODipine (NORVASC) 10 MG tablet Take 10 mg by mouth daily.   [name] Kitchen aspirin EC 81 MG tablet Take 1 tablet (81 mg total) by mouth daily.  [name] Kitchen atorvastatin (LIPITOR) 20 MG tablet Take 1 tablet (20 mg total) by mouth daily.  . carvedilol (COREG) 25 MG tablet Take 1 tablet (25 mg total) by mouth 2 (two) times daily.  . Cholecalciferol (VITAMIN D3) 2000 units capsule Take 2,000 Units by mouth daily.   . enalapril (VASOTEC) 20 MG tablet Take 1 tablet (20 mg total) by mouth 2 (two) times daily.  . ferrous sulfate 325 (65 FE) MG EC tablet Take 325 mg by mouth daily with breakfast.  .  naproxen (NAPRELAN) 500 MG 24 hr tablet Take 500 mg by mouth daily with breakfast.   . Omega-3 Fatty Acids (FISH OIL) 1200 MG CAPS Take by mouth daily.   No facility-administered encounter medications on file  as of 03/03/2017.     Allergies: Patient has no known allergies.  Social History   Social History  . Marital status: Single    Spouse name: N/A  . Number of children: N/A  . Years of education: N/A   Occupational History  . Not on file.   Social History Main Topics  . Smoking status: Never Smoker  . Smokeless tobacco: Never Used  . Alcohol use No  . Drug use: No  . Sexual activity: Not on file   Other Topics Concern  . Not on file   Social History Narrative  . No narrative on file    Family History  Problem Relation Age of Onset  . Hypertension Mother   . Leukemia Father   . Heart attack Paternal Grandmother   . Stroke Paternal Grandfather   . Heart Problems Son 75  . Heart Problems Son 44    Review of Systems: A 12-system review of systems was performed and was negative except as noted in the HPI.  --------------------------------------------------------------------------------------------------  Physical Exam: BP (!) 144/72 (BP Location: Left Arm, Patient Position: Sitting, Cuff Size: Normal)   Pulse (!) 56   Ht 5\' 7"  (1.702 m)   Wt 184 lb (83.5 kg)   BMI 28.82 kg/m   General:  Overweight, elderly man seated comfortably in the exam room. Rodney Wiley is accompanied by his son. HEENT: No conjunctival pallor or scleral icterus.  Moist mucous membranes.  OP clear. Neck: Supple without lymphadenopathy, thyromegaly, JVD, or HJR. Lungs: Normal work of breathing.  Clear to auscultation bilaterally without wheezes or crackles. Heart: Regular rate and rhythm with 2/6 holosystolic and 1/6 early diastolic murmurs. No rubs or gallops.  Non-displaced PMI. Abd: Bowel sounds present.  Soft, NT/ND without hepatosplenomegaly Ext: 2+ pretibial edema to the mid calves.  Radial, PT, and DP pulses are 2+ bilaterally. Skin: warm and dry without rash  EKG: Sinus bradycardia with first-degree AV block (PR interval 214 ms). Poor R-wave progression in V1 through V3. Slight decrease in  heart rate since 10/26/16. Otherwise, no significant interval change (I have personally reviewed the tracings).  No results found for: WBC, HGB, HCT, MCV, PLT  Lab Results  Component Value Date   NA 139 11/02/2016   K 4.1 11/02/2016   CL 102 11/02/2016   CO2 20 11/02/2016   BUN 33 (H) 11/02/2016   CREATININE 1.54 (H) 11/02/2016   GLUCOSE 104 (H) 11/02/2016   ALT 25 01/13/2017    Lab Results  Component Value Date   CHOL 186 01/13/2017   HDL 30 (L) 01/13/2017   LDLCALC 95 01/13/2017   TRIG 305 (H) 01/13/2017   CHOLHDL 6.2 (H) 01/13/2017    --------------------------------------------------------------------------------------------------  ASSESSMENT AND PLAN: Lower extremity edema This has developed over the last 3 weeks. I suspect it is a combination of suboptimally controlled hypertension with hypertensive heart disease and aortic regurgitation, chronic kidney disease, increased salt intake, and amlodipine use. His weight is up 1 pound since our last visit. We have discussed further treatment options and have agreed to start furosemide 40 mg daily. Hopefully, we can avoid stopping amlodipine, as the patient's blood pressure has been somewhat difficult to control. We will check a basic metabolic panel today to ensure stable renal function.  Hypertension Blood pressure suboptimally controlled. As above, we will add furosemide 40 mg daily. Patient should continue the remainder of his home medications.  Renal artery stenosis and chronic kidney disease At least mild to moderate unilateral RAS was noted previously. If his blood pressure remains difficulty to control, we may need to evaluate this further, ideally with angiography. We will need to be careful, however, given his chronic kidney disease.  Aortic and mitral valve valve disease Given mild to moderate AI and MR, we need to be aggressive about controlling the patient's blood pressure and volume status. We will add furosemide,  as above.  Thoracic aortic aneurysm No symptoms. Plan for routine surveillance in a few months. We will continue with blood pressure control and statin therapy.  Follow-up: Return to clinic in 2 weeks.  Nelva Bush, MD 03/03/2017 2:20 PM

## 2017-03-04 LAB — BASIC METABOLIC PANEL
BUN/Creatinine Ratio: 20 (ref 10–24)
BUN: 28 mg/dL — ABNORMAL HIGH (ref 8–27)
CO2: 18 mmol/L (ref 18–29)
Calcium: 9.2 mg/dL (ref 8.6–10.2)
Chloride: 102 mmol/L (ref 96–106)
Creatinine, Ser: 1.42 mg/dL — ABNORMAL HIGH (ref 0.76–1.27)
GFR calc Af Amer: 54 mL/min/{1.73_m2} — ABNORMAL LOW (ref >59)
GFR calc non Af Amer: 47 mL/min/{1.73_m2} — ABNORMAL LOW (ref >59)
Glucose: 97 mg/dL (ref 65–99)
Potassium: 4.5 mmol/L (ref 3.5–5.2)
Sodium: 139 mmol/L (ref 134–144)

## 2017-03-11 ENCOUNTER — Telehealth: Payer: Self-pay | Admitting: Internal Medicine

## 2017-03-11 ENCOUNTER — Other Ambulatory Visit
Admission: RE | Admit: 2017-03-11 | Discharge: 2017-03-11 | Disposition: A | Discharge status: Home / Self Care | Payer: Medicare HMO | Source: Ambulatory Visit | Attending: Internal Medicine | Admitting: Internal Medicine

## 2017-03-11 DIAGNOSIS — I701 Atherosclerosis of renal artery: Secondary | ICD-10-CM | POA: Insufficient documentation

## 2017-03-11 DIAGNOSIS — E785 Hyperlipidemia, unspecified: Secondary | ICD-10-CM | POA: Diagnosis not present

## 2017-03-11 DIAGNOSIS — I10 Essential (primary) hypertension: Secondary | ICD-10-CM | POA: Insufficient documentation

## 2017-03-11 DIAGNOSIS — Z79899 Other long term (current) drug therapy: Secondary | ICD-10-CM

## 2017-03-11 LAB — COMPREHENSIVE METABOLIC PANEL
ALT: 19 U/L (ref 17–63)
AST: 24 U/L (ref 15–41)
Albumin: 4 g/dL (ref 3.5–5.0)
Alkaline Phosphatase: 56 U/L (ref 38–126)
Anion gap: 9 (ref 5–15)
BUN: 33 mg/dL — ABNORMAL HIGH (ref 6–20)
CO2: 26 mmol/L (ref 22–32)
Calcium: 8.8 mg/dL — ABNORMAL LOW (ref 8.9–10.3)
Chloride: 101 mmol/L (ref 101–111)
Creatinine, Ser: 1.84 mg/dL — ABNORMAL HIGH (ref 0.61–1.24)
GFR calc Af Amer: 39 mL/min — ABNORMAL LOW (ref >60)
GFR calc non Af Amer: 33 mL/min — ABNORMAL LOW (ref >60)
Glucose, Bld: 117 mg/dL — ABNORMAL HIGH (ref 65–99)
Potassium: 3.9 mmol/L (ref 3.5–5.1)
Sodium: 136 mmol/L (ref 135–145)
Total Bilirubin: 1.4 mg/dL — ABNORMAL HIGH (ref 0.3–1.2)
Total Protein: 7.3 g/dL (ref 6.5–8.1)

## 2017-03-11 LAB — LIPID PANEL
Cholesterol: 106 mg/dL (ref 0–200)
HDL: 28 mg/dL — ABNORMAL LOW (ref >40)
LDL Cholesterol: 39 mg/dL (ref 0–99)
Total CHOL/HDL Ratio: 3.8 RATIO
Triglycerides: 194 mg/dL — ABNORMAL HIGH (ref <150)
VLDL: 39 mg/dL (ref 0–40)

## 2017-03-11 NOTE — Telephone Encounter (Signed)
I spoke with Rodney Wiley regarding his labs from this morning. Creatinine noted to be up to 1.8 from 1.4 (baseline ~1.5). Leg swelling improved (almost gone this morning). Will cut furosemide back to 40 mg every other day and have patient increase water intake. He should continue to avoid salt. Will f/u as planned on 4/17.  Nelva Bush, MD [facility] Pager: [phone]

## 2017-03-22 ENCOUNTER — Ambulatory Visit (INDEPENDENT_AMBULATORY_CARE_PROVIDER_SITE_OTHER): Payer: Medicare HMO | Admitting: Internal Medicine

## 2017-03-22 ENCOUNTER — Encounter: Payer: Self-pay | Admitting: Internal Medicine

## 2017-03-22 VITALS — BP 152/68 | HR 59 | Ht 67.0 in | Wt 180.5 lb

## 2017-03-22 DIAGNOSIS — N1832 Chronic kidney disease, stage 3b: Secondary | ICD-10-CM | POA: Insufficient documentation

## 2017-03-22 DIAGNOSIS — I712 Thoracic aortic aneurysm, without rupture, unspecified: Secondary | ICD-10-CM

## 2017-03-22 DIAGNOSIS — N183 Chronic kidney disease, stage 3 unspecified: Secondary | ICD-10-CM

## 2017-03-22 DIAGNOSIS — I351 Nonrheumatic aortic (valve) insufficiency: Secondary | ICD-10-CM | POA: Diagnosis not present

## 2017-03-22 DIAGNOSIS — R600 Localized edema: Secondary | ICD-10-CM

## 2017-03-22 DIAGNOSIS — I10 Essential (primary) hypertension: Secondary | ICD-10-CM | POA: Diagnosis not present

## 2017-03-22 DIAGNOSIS — I701 Atherosclerosis of renal artery: Secondary | ICD-10-CM | POA: Diagnosis not present

## 2017-03-22 DIAGNOSIS — N189 Chronic kidney disease, unspecified: Secondary | ICD-10-CM

## 2017-03-22 DIAGNOSIS — I340 Nonrheumatic mitral (valve) insufficiency: Secondary | ICD-10-CM | POA: Diagnosis not present

## 2017-03-22 MED ORDER — AMLODIPINE BESYLATE 5 MG PO TABS: 5.0000 mg | ORAL_TABLET | Freq: Every day | ORAL | 3 refills | Status: DC

## 2017-03-22 MED ORDER — DOXAZOSIN MESYLATE 1 MG PO TABS: 1.0000 mg | ORAL_TABLET | Freq: Every day | ORAL | 3 refills | Status: DC

## 2017-03-22 NOTE — Progress Notes (Signed)
Follow-up Outpatient Visit Date: 03/22/2017  Primary Care Provider: Remi Haggard, [place] [address]  Chief Complaint: Leg swelling  HPI:  Rodney Wiley is a 81 y.o. year-old male with history of hypertension, aortic regurgitation, thoracic aortic aneurysm, and chronic kidney disease stage 3, who presents for evaluation of leg swelling. I last saw him on 03/03/17, at which time he noted considerable leg swelling. He had just returned from the beach, where he had eaten more salt than usual. We started furosemide, which the patient initially took daily and then began using every other day after a bump in his creatinine. He is now using it as needed, most recently 4 days ago. He notes that the swelling had completely resolved, though today there is mild puffiness about the ankles. He has not had chest pain, shortness of breath, palpitations, lightheadedness, or orthopnea. He remains on the remainder of his medications, including amlodipine, enalapril, and carvedilol. He reports his home blood pressures are typically 130s over 70s. He notes one brief episode of orthostatic lightheadedness while washing his car. Otherwise, he has not felt dizzy.  --------------------------------------------------------------------------------------------------  Cardiovascular History & Procedures: Cardiovascular Problems:  Thoracic aortic aneurysm  Aortic regurgitation  Mitral regurgitation  Renal artery stenosis  Risk Factors:  Hypertension, hyperlipidemia, male gender, and age > 80  Cath/PCI:  None  CV Surgery:  None  EP Procedures and Devices:  None  Non-Invasive Evaluation(s):  Renal artery Doppler (01/13/17): Aortic atherosclerosis without stenosis or aneurysm. Normal right renal artery. Mild to moderate left renal artery stenosis (1-59%). Normal/symmetric kidney size.  MRA chest (12/27/16): Aneurysmal disease of the ascending aorta, measuring 4.3 cm at the sinus  of Valsalva and 4.9 cm above the sinotubular junction.  TTE (12/14/16): Mildly dilated LV with mild LVH. Normal contraction with LVEF 55-60%. Grade 1 diastolic dysfunction. Mild to moderate aortic regurgitation. Dilated aorta, measuring up to 4.9 cm above the sinotubular junction. Mild to moderate mitral regurgitation. Mild left atrial enlargement. Normal RV size and function.  Transthoracic echo (09/2016, [facility]): Full report not available.  Reportedly showed LVH with LVEF 93% and diastolic dysfunction.  MR and moderate to severe AI present with sclerotic aortic valve.  Carotid Doppler (10/201&): Full report not available.  Reportedly showed 1-39% stenosis bilaterally.  Transthoracic echo (02/18/16, [facility]): Normal LV and RV size and function.  LVEF > 55%.  Moderate AI; mild MR and TR.  Recent CV Pertinent Labs: Lab Results  Component Value Date   CHOL 106 03/11/2017   CHOL 186 01/13/2017   HDL 28 (L) 03/11/2017   HDL 30 (L) 01/13/2017   LDLCALC 39 03/11/2017   LDLCALC 95 01/13/2017   TRIG 194 (H) 03/11/2017   CHOLHDL 3.8 03/11/2017   K 3.9 03/11/2017   BUN 33 (H) 03/11/2017   BUN 28 (H) 03/03/2017   CREATININE 1.84 (H) 03/11/2017    Past medical and surgical history were reviewed and updated in EPIC.  Outpatient Encounter Prescriptions as of 03/22/2017  Medication Sig  . amLODipine (NORVASC) 10 MG tablet Take 10 mg by mouth daily.   [name] Kitchen aspirin EC 81 MG tablet Take 1 tablet (81 mg total) by mouth daily.  [name] Kitchen atorvastatin (LIPITOR) 20 MG tablet Take 1 tablet (20 mg total) by mouth daily.  . carvedilol (COREG) 25 MG tablet Take 1 tablet (25 mg total) by mouth 2 (two) times daily.  . Cholecalciferol (VITAMIN D3) 2000 units capsule Take 2,000 Units by mouth daily.   [name] Kitchen  enalapril (VASOTEC) 20 MG tablet Take 1 tablet (20 mg total) by mouth 2 (two) times daily.  . ferrous sulfate 325 (65 FE) MG EC tablet Take 325 mg by mouth daily with breakfast.  . Omega-3  Fatty Acids (FISH OIL) 1200 MG CAPS Take by mouth daily.  . furosemide (LASIX) 40 MG tablet Take 1 tablet (40 mg total) by mouth daily. (Patient not taking: Reported on 03/22/2017)   No facility-administered encounter medications on file as of 03/22/2017.     Allergies: Patient has no known allergies.  Social History   Social History  . Marital status: Single    Spouse name: N/A  . Number of children: N/A  . Years of education: N/A   Occupational History  . Not on file.   Social History Main Topics  . Smoking status: Never Smoker  . Smokeless tobacco: Never Used  . Alcohol use No  . Drug use: No  . Sexual activity: Not on file   Other Topics Concern  . Not on file   Social History Narrative  . No narrative on file    Family History  Problem Relation Age of Onset  . Hypertension Mother   . Leukemia Father   . Heart attack Paternal Grandmother   . Stroke Paternal Grandfather   . Heart Problems Son 79  . Heart Problems Son 47    Review of Systems: A 12-system review of systems was performed and was negative except as noted in the HPI.  --------------------------------------------------------------------------------------------------  Physical Exam: BP (!) 152/68 (BP Location: Left Arm, Patient Position: Sitting, Cuff Size: Normal)   Pulse (!) 59   Ht 5\' 7"  (1.702 m)   Wt 180 lb 8 oz (81.9 kg)   BMI 28.27 kg/m   General:  Overweight, elderly man seated comfortably in the exam room. HEENT: No conjunctival pallor or scleral icterus.  Moist mucous membranes.  OP clear. Neck: Supple without lymphadenopathy, thyromegaly, JVD, or HJR. Lungs: Normal work of breathing.  Clear to auscultation bilaterally without wheezes or crackles. Heart: Regular rate and rhythm with 2/6 holosystolic Murmur loudest at the left lower sternal border. No rubs or gallops.  Non-displaced PMI. Abd: Bowel sounds present.  Soft, NT/ND without hepatosplenomegaly Ext: 1+ Ankle edema bilaterally,  improved from her last visit.  Radial, PT, and DP pulses are 2+ bilaterally. Skin: warm and dry without rash  No results found for: WBC, HGB, HCT, MCV, PLT  Lab Results  Component Value Date   NA 136 03/11/2017   K 3.9 03/11/2017   CL 101 03/11/2017   CO2 26 03/11/2017   BUN 33 (H) 03/11/2017   CREATININE 1.84 (H) 03/11/2017   GLUCOSE 117 (H) 03/11/2017   ALT 19 03/11/2017    Lab Results  Component Value Date   CHOL 106 03/11/2017   HDL 28 (L) 03/11/2017   LDLCALC 39 03/11/2017   TRIG 194 (H) 03/11/2017   CHOLHDL 3.8 03/11/2017    --------------------------------------------------------------------------------------------------  ASSESSMENT AND PLAN: Lower extremity edema Improved from our last visit. I suspect this is likely a combination of increased salt intake prior to our last visit and effects from amlodipine. We have agreed to decrease amlodipine to 5 mg daily. In its place, I will also add doxazosin 1 mg daily at bedtime (though edema may still be possible with this agent). If his renal function has improved to baseline, we could also consider spironolactone with close monitoring in the future. Mr. Ziomek can continue to use furosemide as needed for  weight gain or swelling. We will recheck a basic metabolic panel today due to acute on chronic kidney disease noted on our last check about 2 weeks ago.  Hypertension Blood pressure is modestly elevated today, though his home readings have been reasonable (130s over 70s). As above, we will decrease amlodipine and add doxazosin.  Renal artery stenosis and chronic kidney disease This was noted on renal artery Doppler, with mild to moderate disease involving the left renal artery. We discussed the potential influence of this on hypertension. We have agreed to continue with medical therapy and will defer further investigation with angiography at this time. Mr. Cappiello may benefit from nephrology consultation in the  future.  Aortic and mitral valve valve disease The patient appears euvolemic and well compensated with exception of mild ankle edema, improved from our last visit. We will continue with blood pressure control, as outlined above.  Thoracic aortic aneurysm The patient is asymptomatic today. We will plan for repeat cross-sectional imaging in about 3 months.  Follow-up: Return to clinic in 6 weeks.  Nelva Bush, MD 03/22/2017 8:32 PM

## 2017-03-22 NOTE — Patient Instructions (Addendum)
Medication Instructions:  Your physician has recommended you make the following change in your medication:   1- DECREASE Amlodipine to 5 mg by mouth once a day.  You may take 1/2 tablet of your Amlodipine 10 mg tablet to equal 5 mg until you pick up your new prescription.  2- START Doxazosin 1mg   (1 tablet) by mouth once a day at bedtime.   Labwork: Your physician recommends that you return for lab work in: TODAY (BMP).   Testing/Procedures: none  Follow-Up: Your physician recommends that you schedule a follow-up appointment in: [place].   If you need a refill on your cardiac medications before your next appointment, please call your pharmacy.

## 2017-03-23 LAB — BASIC METABOLIC PANEL
BUN/Creatinine Ratio: 23 (ref 10–24)
BUN: 27 mg/dL (ref 8–27)
CO2: 18 mmol/L (ref 18–29)
Calcium: 9.4 mg/dL (ref 8.6–10.2)
Chloride: 101 mmol/L (ref 96–106)
Creatinine, Ser: 1.19 mg/dL (ref 0.76–1.27)
GFR calc Af Amer: 67 mL/min/{1.73_m2} (ref >59)
GFR calc non Af Amer: 58 mL/min/{1.73_m2} — ABNORMAL LOW (ref >59)
Glucose: 89 mg/dL (ref 65–99)
Potassium: 4.7 mmol/L (ref 3.5–5.2)
Sodium: 139 mmol/L (ref 134–144)

## 2017-04-01 DIAGNOSIS — L570 Actinic keratosis: Secondary | ICD-10-CM | POA: Diagnosis not present

## 2017-04-01 DIAGNOSIS — L821 Other seborrheic keratosis: Secondary | ICD-10-CM | POA: Diagnosis not present

## 2017-05-05 ENCOUNTER — Ambulatory Visit: Payer: Medicare HMO | Admitting: Internal Medicine

## 2017-05-11 ENCOUNTER — Ambulatory Visit (INDEPENDENT_AMBULATORY_CARE_PROVIDER_SITE_OTHER): Payer: Medicare HMO | Admitting: Nurse Practitioner

## 2017-05-11 ENCOUNTER — Encounter: Payer: Self-pay | Admitting: Nurse Practitioner

## 2017-05-11 VITALS — BP 118/64 | HR 49 | Ht 62.0 in | Wt 179.5 lb

## 2017-05-11 DIAGNOSIS — I712 Thoracic aortic aneurysm, without rupture, unspecified: Secondary | ICD-10-CM

## 2017-05-11 DIAGNOSIS — I10 Essential (primary) hypertension: Secondary | ICD-10-CM

## 2017-05-11 DIAGNOSIS — I5032 Chronic diastolic (congestive) heart failure: Secondary | ICD-10-CM | POA: Diagnosis not present

## 2017-05-11 DIAGNOSIS — I38 Endocarditis, valve unspecified: Secondary | ICD-10-CM

## 2017-05-11 NOTE — Progress Notes (Signed)
Office Visit    Patient Name: Rodney Wiley Date of Encounter: 05/11/2017  Primary Care Provider:  Remi Haggard, FNP Primary Cardiologist:  Andree Coss, MD   Chief Complaint    81 year old male with a prior history of diastolic CHF, chronic lower extremity swelling, ascending aortic aneurysm, aortic insufficiency, mitral regurgitation, hypertension, GERD, and stage III chronic kidney disease, who presents for follow-up.  Past Medical History    Past Medical History:  Diagnosis Date  . Arthropathy   . Ascending aortic aneurysm ([place])    a. 12/2016 MRA: 4.3cm @ sinus of valsalva, 4.9cm above sinotubular jxn.  . Cardiac murmur   . Cardiomegaly   . CKD (chronic kidney disease), stage III   . Essential hypertension   . GERD (gastroesophageal reflux disease)   . Hemorrhoids   . Left renal artery stenosis ([facility])    a. 01/2017 RA u/s: moderate L RAS.  [name] Kitchen Lower extremity edema   . Nonrheumatic aortic (valve) insufficiency    a. 12/2016 Echo: mild to mod AI.  [name] Kitchen Nonrheumatic mitral (valve) insufficiency    a. 12/2016 Echo: mild to mod MR.  [name] Kitchen Nonrheumatic pulmonary valve insufficiency    Past Surgical History:  Procedure Laterality Date  . ELBOW SURGERY Left   . hemorhoidectomy    . SHOULDER SURGERY Right     Allergies  No Known Allergies  History of Present Illness    81 year old male with the above complex past medical history including  diastolic CHF, chronic lower extremity swelling, ascending aortic aneurysm, aortic insufficiency, mitral regurgitation, hypertension, GERD, and stage III chronic kidney disease. He was last seen in clinic in April at which point, he was taking Lasix as needed for lower extremity swelling. He says since then, he has required a few doses of Lasix each week. He doesn't think there is any rhyme or reason to when he might require dose. He doesn't necessarily wake himself on regular basis. He does avoid salt but also goes to a deli for lunch 3-4 times  per week. He did take 2 doses of Lasix this past weekend and also one dose this morning. He has not been having any chest pain. He occasionally notes dyspnea on exertion but then also says that he is able to push mow his yard for 30-40 minutes without stopping. He denies PND, orthopnea, dizziness, syncope, palpitations, or early satiety.  Home Medications    Prior to Admission medications   Medication Sig Start Date End Date Taking? Authorizing Provider  amLODipine (NORVASC) 5 MG tablet Take 1 tablet (5 mg total) by mouth daily. 03/22/17 06/20/17 Yes End, Harrell Gave, MD  aspirin EC 81 MG tablet Take 1 tablet (81 mg total) by mouth daily. 01/26/17  Yes End, Harrell Gave, MD  atorvastatin (LIPITOR) 20 MG tablet Take 1 tablet (20 mg total) by mouth daily. 01/17/17 05/11/17 Yes End, Harrell Gave, MD  carvedilol (COREG) 25 MG tablet Take 1 tablet (25 mg total) by mouth 2 (two) times daily. 01/26/17 05/11/17 Yes End, Harrell Gave, MD  Cholecalciferol (VITAMIN D3) 2000 units capsule Take 2,000 Units by mouth daily.    Yes [provider]  doxazosin (CARDURA) 1 MG tablet Take 1 tablet (1 mg total) by mouth at bedtime. 03/22/17  Yes End, Harrell Gave, MD  enalapril (VASOTEC) 20 MG tablet Take 1 tablet (20 mg total) by mouth 2 (two) times daily. 10/26/16  Yes End, Harrell Gave, MD  ferrous sulfate 325 (65 FE) MG EC tablet Take 325 mg by mouth daily  with breakfast.   Yes [provider]  furosemide (LASIX) 40 MG tablet Take 1 tablet (40 mg total) by mouth daily. 03/03/17 06/01/17 Yes End, Harrell Gave, MD    Review of Systems    As above, he does have some dyspnea on exertion but has pretty good exercise tolerance, push-mowing his yard for 30 minutes without stopping.  He has intermittent lower extremity swelling. He denies chest pain, palpitations, dyspnea, PND, orthopnea, dizziness, syncope, or early satiety. All other systems reviewed and are otherwise negative except as noted above.  Physical Exam      VS:  BP 118/64 (BP Location: Left Arm, Patient Position: Sitting, Cuff Size: Normal)   Pulse (!) 49   Ht 5\' 2"  (1.575 m)   Wt 179 lb 8 oz (81.4 kg)   BMI 32.83 kg/m  , BMI Body mass index is 32.83 kg/m. GEN: Well nourished, well developed, in no acute distress.  HEENT: normal.  Neck: Supple, no JVD, carotid bruits, or masses. Cardiac: RRR, 2/6 holosystolic murmur heard throughout but loudest at the bilateral upper sternal borders. I do not appreciate a diastolic murmur. No rubs, or gallops. No clubbing, cyanosis, 1+ bilateral lower extremity edema to the lower calf.  Radials/DP/PT 2+ and equal bilaterally.  Respiratory:  Respirations regular and unlabored, clear to auscultation bilaterally. GI: Soft, nontender, nondistended, BS + x 4. MS: no deformity or atrophy. Skin: warm and dry, no rash. Neuro:  Strength and sensation are intact. Psych: Normal affect.  Accessory Clinical Findings    ECG - Sinus bradycardia, 49, first-degree AV block, left axis deviation, poor R-wave progression noted acute changes.  Assessment & Plan    1.  Chronic diastolic congestive heart failure with lower extremity edema: Patient has been taking Lasix as needed. He took 2 doses over this past weekend and a dose this morning. He has 1+ bilateral lower extremity edema today. He is otherwise euvolemic. He has not been having any significant exercise intolerance, PND, orthopnea. Given prior history of stage III chronic kidney disease, I'm reluctant to escalate his home Lasix dose. I recommended instead that he wear thromboembolic stockings and effort to decompress his lower legs and hopefully improve edema. He also talked about avoidance of salt as he is going out and eating deli food about 3-4 days per week. His heart rate and blood pressure are stable today. He remains on beta blocker, amlodipine, and ACE inhibitor therapy.  2. Essential hypertension: Stable today.  3. Ascending aortic aneurysm: He will be due  for repeat MRA in July. I will arrange for this.  4. Valvular heart disease: This includes mild to moderate mitral regurgitation as well as mild to moderate aortic insufficiency. He has not been having any significant dyspnea or volume overload. Plan follow-up echo in 2019.  5. Stage III chronic kidney disease: Creatinine was 1.19 on April 17.  6. Disposition: Patient will begin wearing TED hose. Follow-up MRA in July. Follow-up in clinic in 3 months or sooner if necessary.   Murray Hodgkins, NP 05/11/2017, 4:36 PM

## 2017-05-11 NOTE — Patient Instructions (Addendum)
Medication Instructions:  Your physician recommends that you continue on your current medications as directed. Please refer to the Current Medication list given to you today.   Labwork: BMET at the [facility] outpatient lab. No appt  Necessary Please have this before your scheduled MRA.   Testing/Procedures: MRA chest without contrast. [facility] [place]. [place] [phone]  Wednesday, June 20 Arrival time 8:30am   Follow-Up: Your physician recommends that you schedule a follow-up appointment in: 3 months with Dr. Saunders Revel.    Any Other Special Instructions Will Be Listed Below (If Applicable).     If you need a refill on your cardiac medications before your next appointment, please call your pharmacy.

## 2017-05-18 ENCOUNTER — Ambulatory Visit: Payer: Medicare HMO

## 2017-05-19 ENCOUNTER — Other Ambulatory Visit
Admission: RE | Admit: 2017-05-19 | Discharge: 2017-05-19 | Disposition: A | Discharge status: Home / Self Care | Payer: Medicare HMO | Source: Ambulatory Visit | Attending: Nurse Practitioner | Admitting: Nurse Practitioner

## 2017-05-19 ENCOUNTER — Other Ambulatory Visit: Payer: Self-pay | Admitting: *Deleted

## 2017-05-19 DIAGNOSIS — I10 Essential (primary) hypertension: Secondary | ICD-10-CM

## 2017-05-19 DIAGNOSIS — Z5181 Encounter for therapeutic drug level monitoring: Secondary | ICD-10-CM

## 2017-05-19 DIAGNOSIS — Z79899 Other long term (current) drug therapy: Secondary | ICD-10-CM

## 2017-05-19 LAB — BASIC METABOLIC PANEL
Anion gap: 7 (ref 5–15)
BUN: 31 mg/dL — ABNORMAL HIGH (ref 6–20)
CO2: 24 mmol/L (ref 22–32)
Calcium: 9 mg/dL (ref 8.9–10.3)
Chloride: 106 mmol/L (ref 101–111)
Creatinine, Ser: 1.86 mg/dL — ABNORMAL HIGH (ref 0.61–1.24)
GFR calc Af Amer: 38 mL/min — ABNORMAL LOW (ref >60)
GFR calc non Af Amer: 33 mL/min — ABNORMAL LOW (ref >60)
Glucose, Bld: 110 mg/dL — ABNORMAL HIGH (ref 65–99)
Potassium: 4.1 mmol/L (ref 3.5–5.1)
Sodium: 137 mmol/L (ref 135–145)

## 2017-05-25 ENCOUNTER — Ambulatory Visit (HOSPITAL_COMMUNITY): Payer: Medicare HMO

## 2017-06-02 ENCOUNTER — Other Ambulatory Visit
Admission: RE | Admit: 2017-06-02 | Discharge: 2017-06-02 | Disposition: A | Discharge status: Home / Self Care | Payer: Medicare HMO | Source: Ambulatory Visit | Attending: Nurse Practitioner | Admitting: Nurse Practitioner

## 2017-06-02 DIAGNOSIS — I10 Essential (primary) hypertension: Secondary | ICD-10-CM | POA: Diagnosis not present

## 2017-06-02 DIAGNOSIS — Z79899 Other long term (current) drug therapy: Secondary | ICD-10-CM | POA: Diagnosis not present

## 2017-06-02 DIAGNOSIS — Z5181 Encounter for therapeutic drug level monitoring: Secondary | ICD-10-CM | POA: Diagnosis not present

## 2017-06-02 LAB — BASIC METABOLIC PANEL
Anion gap: 6 (ref 5–15)
BUN: 33 mg/dL — ABNORMAL HIGH (ref 6–20)
CO2: 22 mmol/L (ref 22–32)
Calcium: 8.8 mg/dL — ABNORMAL LOW (ref 8.9–10.3)
Chloride: 108 mmol/L (ref 101–111)
Creatinine, Ser: 1.68 mg/dL — ABNORMAL HIGH (ref 0.61–1.24)
GFR calc Af Amer: 43 mL/min — ABNORMAL LOW (ref >60)
GFR calc non Af Amer: 37 mL/min — ABNORMAL LOW (ref >60)
Glucose, Bld: 120 mg/dL — ABNORMAL HIGH (ref 65–99)
Potassium: 4 mmol/L (ref 3.5–5.1)
Sodium: 136 mmol/L (ref 135–145)

## 2017-06-06 ENCOUNTER — Ambulatory Visit (HOSPITAL_COMMUNITY)
Admission: RE | Admit: 2017-06-06 | Discharge: 2017-06-06 | Disposition: A | Discharge status: Home / Self Care | Payer: Medicare HMO | Source: Ambulatory Visit | Attending: Nurse Practitioner | Admitting: Nurse Practitioner

## 2017-06-06 DIAGNOSIS — I712 Thoracic aortic aneurysm, without rupture, unspecified: Secondary | ICD-10-CM

## 2017-08-14 NOTE — Progress Notes (Signed)
Follow-up Outpatient Visit Date: 08/16/2017  Primary Care Provider: Remi Haggard, [place] [address]  Chief Complaint: Elevated blood pressure  HPI:  [name] is a 81 y.o. year-old male with history of hypertension, aortic regurgitation, thoracic aortic aneurysm, and chronic kidney disease stage 3, who presents for follow-up of hypertension and thoracic aortic aneurysm. He was last seen by Ignacia Bayley, NP, in early June. At that time, [name] was doing well. He was using furosemide as needed a few days a week. Blood pressure was well-controlled.  Today, [name] reports that his blood pressure has not been very well-controlled over the last 2-3 weeks. It has ranged anywhere from 165-195/47-84. He has not had any changes in his medication or diet (including salt consumption). He notes a single episode of orthostatic lightheadedness last week. He has otherwise been asymptomatic, denying chest pain, shortness of breath, palpitations, orthopnea, PND, and edema. He has only been using furosemide sporadically for leg swelling, having last taken this 7-10 days ago. He has tolerated low-dose doxazosin that was started last visit.  --------------------------------------------------------------------------------------------------  Cardiovascular History & Procedures: Cardiovascular Problems:  Thoracic aortic aneurysm  Aortic regurgitation  Mitral regurgitation  Renal artery stenosis  Risk Factors:  Hypertension, hyperlipidemia, male gender, and age > 42  Cath/PCI:  None  CV Surgery:  None  EP Procedures and Devices:  None  Non-Invasive Evaluation(s):  MRA chest (06/06/17): Stable ascending aortic aneurysm, measuring 5.0 cm at the sinotubular junction and 4.8 cm at the level of the main pulmonary artery.  Renal artery Doppler (01/13/17): Aortic atherosclerosis without stenosis or aneurysm. Normal right renal artery. Mild to moderate left  renal artery stenosis (1-59%). Normal/symmetric kidney size.  MRA chest (12/27/16): Aneurysmal disease of the ascending aorta, measuring 4.3 cm at the sinus of Valsalva and 4.9 cm above the sinotubular junction.  TTE (12/14/16): Mildly dilated LV with mild LVH. Normal contraction with LVEF 55-60%. Grade 1 diastolic dysfunction. Mild to moderate aortic regurgitation. Dilated aorta, measuring up to 4.9 cm above the sinotubular junction. Mild to moderate mitral regurgitation. Mild left atrial enlargement. Normal RV size and function.  Transthoracic echo (09/2016, [place]): Full report not available.  Reportedly showed LVH with LVEF 90% and diastolic dysfunction.  MR and moderate to severe AI present with sclerotic aortic valve.  Carotid Doppler (10/201&): Full report not available.  Reportedly showed 1-39% stenosis bilaterally.  Transthoracic echo (02/18/16, [facility]): Normal LV and RV size and function.  LVEF > 55%.  Moderate AI; mild MR and TR.  Recent CV Pertinent Labs: Lab Results  Component Value Date   CHOL 106 03/11/2017   CHOL 186 01/13/2017   HDL 28 (L) 03/11/2017   HDL 30 (L) 01/13/2017   LDLCALC 39 03/11/2017   LDLCALC 95 01/13/2017   TRIG 194 (H) 03/11/2017   CHOLHDL 3.8 03/11/2017   K 4.0 06/02/2017   BUN 33 (H) 06/02/2017   BUN 27 03/22/2017   CREATININE 1.68 (H) 06/02/2017   Past medical and surgical history were reviewed and updated in EPIC.  Outpatient Encounter Prescriptions as of 08/16/2017  Medication Sig  . amLODipine (NORVASC) 5 MG tablet Take 1 tablet (5 mg total) by mouth daily.  [name] Kitchen aspirin EC 81 MG tablet Take 1 tablet (81 mg total) by mouth daily.  [name] Kitchen atorvastatin (LIPITOR) 20 MG tablet Take 1 tablet (20 mg total) by mouth daily.  . carvedilol (COREG) 25 MG tablet Take 1 tablet (25 mg total) by  mouth 2 (two) times daily.  . Cholecalciferol (VITAMIN D3) 2000 units capsule Take 2,000 Units by mouth daily.   [name] Kitchen doxazosin (CARDURA) 1 MG  tablet Take 1 tablet (1 mg total) by mouth at bedtime.  . enalapril (VASOTEC) 20 MG tablet Take 1 tablet (20 mg total) by mouth 2 (two) times daily.  . ferrous sulfate 325 (65 FE) MG EC tablet Take 325 mg by mouth daily with breakfast.  . furosemide (LASIX) 40 MG tablet Take 1 tablet (40 mg total) by mouth daily.   No facility-administered encounter medications on file as of 08/16/2017.     Allergies: Patient has no known allergies.  Social History   Social History  . Marital status: Single    Spouse name: N/A  . Number of children: N/A  . Years of education: N/A   Occupational History  . Not on file.   Social History Main Topics  . Smoking status: Never Smoker  . Smokeless tobacco: Never Used  . Alcohol use No  . Drug use: No  . Sexual activity: Not on file   Other Topics Concern  . Not on file   Social History Narrative  . No narrative on file    Family History  Problem Relation Age of Onset  . Hypertension Mother   . Leukemia Father   . Heart attack Paternal Grandmother   . Stroke Paternal Grandfather   . Heart Problems Son 74  . Heart Problems Son 8    Review of Systems: Review of Systems  Constitutional: Negative.   HENT: Negative.   Eyes: Negative.   Respiratory: Negative.   Cardiovascular: Positive for leg swelling (well-controlled with prn furosemide). Negative for chest pain, palpitations and orthopnea.  Gastrointestinal: Negative.   Genitourinary: Negative.   Musculoskeletal: Negative.   Skin: Negative.   Neurological: Positive for dizziness (Single episode of lightheadedness last week).  Endo/Heme/Allergies: Negative.   Psychiatric/Behavioral: Negative.    --------------------------------------------------------------------------------------------------  Physical Exam: BP (!) 190/82 (BP Location: Left Arm, Patient Position: Sitting, Cuff Size: Normal)   Pulse (!) 53   Ht 5\' 7"  (1.702 m)   Wt 175 lb 12 oz (79.7 kg)   BMI 27.53 kg/m    General:  Overweight elderly man, seated comfortably in the exam room. HEENT: No conjunctival pallor or scleral icterus.  Moist mucous membranes.  OP clear. Neck: Supple without lymphadenopathy, thyromegaly, JVD, or HJR. Lungs: Normal work of breathing.  Clear to auscultation bilaterally without wheezes or crackles. Heart: Bradycardic but regular rhythm with 2/6 systolic and early diastolic murmur loudest at the right upper sternal border. No rubs or gallops. Abd: Bowel sounds present.  Soft, NT/ND without hepatosplenomegaly Ext: No lower extremity edema.  Radial, PT, and DP pulses are 2+ bilaterally. Skin: Warm and dry without rash.  No results found for: WBC, HGB, HCT, MCV, PLT  Lab Results  Component Value Date   NA 136 06/02/2017   K 4.0 06/02/2017   CL 108 06/02/2017   CO2 22 06/02/2017   BUN 33 (H) 06/02/2017   CREATININE 1.68 (H) 06/02/2017   GLUCOSE 120 (H) 06/02/2017   ALT 19 03/11/2017    Lab Results  Component Value Date   CHOL 106 03/11/2017   HDL 28 (L) 03/11/2017   LDLCALC 39 03/11/2017   TRIG 194 (H) 03/11/2017   CHOLHDL 3.8 03/11/2017    --------------------------------------------------------------------------------------------------  ASSESSMENT AND PLAN: Hypertension Pressure is poorly controlled today, consistent with what [name] has experienced over the last 2 weeks. Interestingly,  his blood pressure was great at his last visit in June. I am not sure what precipitated this change. We reviewed the importance of sodium restriction. We will retry escalation of amlodipine to 10 mg daily (some lower extremity edema noted in the past, though this developed after he had been on amlodipine for several months). I have also asked [name] to increase doxazosin to 2 mg daily at bedtime. We will have him return for a blood pressure check in 2 weeks. Depending on his blood pressure response, we may also need to consider sleep study and reevaluation of his renal  artery stenosis as well as assess serum aldosterone and plasma renin activity.  Renal artery stenosis Mild to moderate left renal artery stenosis noted in February by Doppler study. If blood pressure remains difficult to control, we may need to repeat this study to evaluate for progression or consider angiography. However, I would like to minimize/avoid contrast exposure in the setting of chronic kidney disease.  Thoracic aortic aneurysm MRA in July shows relatively stable enlargement of the ascending aortic aneurysm. Given that it measures 5.0 cm just above the sinotubular junction, I will refer [name] to cardiac surgery for further management. We will continue with blood pressure control, as outlined above, and statin therapy.  Aortic and mitral regurgitation Systolic and diastolic murmurs appreciated on exam today consistent with mild to moderate aortic and mitral regurgitation seen on last years ago. Poorly controlled blood pressure is likely exacerbating regurgitation. We will increase antihypertensive regimen, as above. [name] is not have any symptoms of heart failure at this time and should continue to use furosemide as needed. We will repeat an echocardiogram in January.  Follow-up: Return to clinic for blood pressure check in 2 weeks. Return to see me in one month.  Nelva Bush, MD 08/16/2017 2:25 PM

## 2017-08-16 ENCOUNTER — Encounter: Payer: Self-pay | Admitting: Internal Medicine

## 2017-08-16 ENCOUNTER — Ambulatory Visit (INDEPENDENT_AMBULATORY_CARE_PROVIDER_SITE_OTHER): Payer: Medicare HMO | Admitting: Internal Medicine

## 2017-08-16 VITALS — BP 190/82 | HR 53 | Ht 67.0 in | Wt 175.8 lb

## 2017-08-16 DIAGNOSIS — I340 Nonrheumatic mitral (valve) insufficiency: Secondary | ICD-10-CM | POA: Diagnosis not present

## 2017-08-16 DIAGNOSIS — I712 Thoracic aortic aneurysm, without rupture, unspecified: Secondary | ICD-10-CM

## 2017-08-16 DIAGNOSIS — I701 Atherosclerosis of renal artery: Secondary | ICD-10-CM | POA: Diagnosis not present

## 2017-08-16 DIAGNOSIS — I351 Nonrheumatic aortic (valve) insufficiency: Secondary | ICD-10-CM

## 2017-08-16 DIAGNOSIS — I10 Essential (primary) hypertension: Secondary | ICD-10-CM | POA: Diagnosis not present

## 2017-08-16 MED ORDER — DOXAZOSIN MESYLATE 2 MG PO TABS: 2.0000 mg | ORAL_TABLET | Freq: Every day | ORAL | 3 refills | Status: DC

## 2017-08-16 MED ORDER — AMLODIPINE BESYLATE 10 MG PO TABS: 10.0000 mg | ORAL_TABLET | Freq: Every day | ORAL | 3 refills | Status: DC

## 2017-08-16 NOTE — Patient Instructions (Signed)
Medication Instructions:  Your physician has recommended you make the following change in your medication:  1- INCREASE Amlodipine 10 mg by mouth once a day. 2- INCREASE Doxazosin 2 mg by mouth every night.   Labwork: none  Testing/Procedures: none  Follow-Up: You have been referred to [facility] in [place]. Someone will call you to schedule an appointment.  Your physician recommends that you schedule a follow-up appointment in: 1-2 WEEKS FOR BP CHECK WITH NURSE.  Your physician recommends that you schedule a follow-up appointment in: [facility].    If you need a refill on your cardiac medications before your next appointment, please call your pharmacy.

## 2017-08-23 ENCOUNTER — Ambulatory Visit: Payer: Medicare HMO

## 2017-09-05 ENCOUNTER — Institutional Professional Consult (permissible substitution) (INDEPENDENT_AMBULATORY_CARE_PROVIDER_SITE_OTHER): Payer: Medicare HMO | Admitting: Thoracic Surgery (Cardiothoracic Vascular Surgery)

## 2017-09-05 ENCOUNTER — Encounter: Payer: Self-pay | Admitting: Thoracic Surgery (Cardiothoracic Vascular Surgery)

## 2017-09-05 VITALS — BP 152/70 | HR 50 | Resp 16 | Ht 67.0 in | Wt 180.0 lb

## 2017-09-05 DIAGNOSIS — I712 Thoracic aortic aneurysm, without rupture, unspecified: Secondary | ICD-10-CM

## 2017-09-05 DIAGNOSIS — I701 Atherosclerosis of renal artery: Secondary | ICD-10-CM

## 2017-09-05 DIAGNOSIS — I340 Nonrheumatic mitral (valve) insufficiency: Secondary | ICD-10-CM

## 2017-09-05 DIAGNOSIS — I351 Nonrheumatic aortic (valve) insufficiency: Secondary | ICD-10-CM | POA: Diagnosis not present

## 2017-09-05 DIAGNOSIS — N189 Chronic kidney disease, unspecified: Secondary | ICD-10-CM

## 2017-09-05 NOTE — Progress Notes (Signed)
[address]       [place] [address]             [place] REPORT  Referring Provider is End, Harrell Gave, MD PCP is Remi Haggard, FNP  Chief Complaint  Patient presents with  . TAA    MRA CHEST 12/27/16, 06/06/17, ECHO 12/14/16  . Aortic Insuffiency    HPI:  Patient is a 81 year old male with history of aortic insufficiency, ascending thoracic aortic aneurysm, hypertension, and stage III chronic kidney disease referred for surgical consultation regarding management of ascending thoracic aortic aneurysm with aortic insufficiency. The patient had been followed in the past by Dr. Ubaldo Glassing that for the past 2 years has been followed by Dr. Saunders Revel.  He has remained fairly active physically and without any significant limitations, although he complains of exertional fatigue and shortness of breath with bilateral lower extremity edema. He is on multiple medications for management of hypertension which recently has become more difficult to control. Most recent transthoracic echocardiogram performed 12/14/2016 revealed normal left ventricular systolic function with ejection fraction estimated 55-60%. There was felt to be "mild to moderate" aortic insufficiency but the diastolic pressure half-time was reported only 191 ms.  Left ventricular size was normal.  Non-gated MRA of the thoracic aorta performed 12/27/2016 revealed moderate sized ascending thoracic aorta with maximum transverse diameter reported 4.9 cm. There was moderate aneurysmal enlargement of the aortic root with diameter 4.3 cm at the sinuses of Valsalva.  Follow-up MRA performed 06/06/2017 revealed essentially stable findings with maximum transverse diameter reported 5.0 cm.  The patient was referred for elective surgical consultation.  The patient is single and lives alone in [place]. 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 He remains reasonably active  physically and entirely functionally independent. He continues to work in his yard and take care of all associated chores around the house. He complains of decreased energy over the past 2-3 years. He reports some mild symptoms of exertional shortness of breath that occur only with fairly strenuous exertion. He denies any resting shortness of breath, PND, orthopnea, or chronic cough. He does have mild chronic bilateral lower extremity edema. He reports occasional dizzy spells which are not positional in nature. He denies any history of syncope. He reports no history of chest pain or chest tightness either with activity or at rest. He has some degree of chronic arthritis in his hands and back but his mobility is normal in his functional status quite good for his age. His blood pressure has been somewhat elevated recently, requiring addition of another agent to bring it under better control. He checks his blood pressure regularly.  Past Medical History:  Diagnosis Date  . Arthropathy   . Ascending aortic aneurysm ([facility])    a. 12/2016 MRA: 4.3cm @ sinus of valsalva, 4.9cm above sinotubular jxn.  . Cardiac murmur   . Cardiomegaly   . CKD (chronic kidney disease), stage III ([facility])   . Essential hypertension   . GERD (gastroesophageal reflux disease)   . Hemorrhoids   . Left renal artery stenosis ([facility])    a. 01/2017 RA u/s: moderate L RAS.  [name] Kitchen Lower extremity edema   . Nonrheumatic aortic (valve) insufficiency    a. 12/2016 Echo: mild to mod AI.  [name] Kitchen Nonrheumatic mitral (valve) insufficiency    a. 12/2016 Echo: mild to mod MR.  [name] Kitchen Nonrheumatic pulmonary valve insufficiency  Past Surgical History:  Procedure Laterality Date  . ELBOW SURGERY Left   . hemorhoidectomy    . SHOULDER SURGERY Right     Family History  Problem Relation Age of Onset  . Hypertension Mother   . Leukemia Father   . Heart attack Paternal Grandmother   . Stroke Paternal Grandfather   . Heart Problems Son 12  . Heart  Problems Son 75    Social History   Social History  . Marital status: Single    Spouse name: N/A  . Number of children: N/A  . Years of education: N/A   Occupational History  . Not on file.   Social History Main Topics  . Smoking status: Never Smoker  . Smokeless tobacco: Never Used  . Alcohol use No  . Drug use: No  . Sexual activity: Not on file   Other Topics Concern  . Not on file   Social History Narrative  . No narrative on file    Current Outpatient Prescriptions  Medication Sig Dispense Refill  . amLODipine (NORVASC) 10 MG tablet Take 1 tablet (10 mg total) by mouth daily. 90 tablet 3  . aspirin EC 81 MG tablet Take 1 tablet (81 mg total) by mouth daily. 90 tablet 3  . atorvastatin (LIPITOR) 20 MG tablet Take 1 tablet (20 mg total) by mouth daily. 90 tablet 3  . carvedilol (COREG) 25 MG tablet Take 1 tablet (25 mg total) by mouth 2 (two) times daily. 180 tablet 3  . Cholecalciferol (VITAMIN D3) 2000 units capsule Take 2,000 Units by mouth daily.     [name] Kitchen doxazosin (CARDURA) 2 MG tablet Take 1 tablet (2 mg total) by mouth daily. 90 tablet 3  . enalapril (VASOTEC) 20 MG tablet Take 1 tablet (20 mg total) by mouth 2 (two) times daily. 180 tablet 3  . ferrous sulfate 325 (65 FE) MG EC tablet Take 325 mg by mouth daily with breakfast.    . furosemide (LASIX) 40 MG tablet Take 1 tablet (40 mg total) by mouth daily. 90 tablet 3  . Omega-3 Fatty Acids (FISH OIL) 1200 MG CAPS Take 1 capsule by mouth daily.     No current facility-administered medications for this visit.     No Known Allergies    Review of Systems:   General:  normal appetite, decreased energy, no weight gain, no weight loss, no fever  Cardiac:  no chest pain with exertion, no chest pain at rest, + SOB with more strenuous exertion, no resting SOB, no PND, no orthopnea, no palpitations, no arrhythmia, no atrial fibrillation, + LE edema, occasional dizzy spells, no syncope  Respiratory:  no shortness of  breath, no home oxygen, no productive cough, no dry cough, no bronchitis, no wheezing, no hemoptysis, no asthma, no pain with inspiration or cough, no sleep apnea, no CPAP at night  GI:   no difficulty swallowing, no reflux, no frequent heartburn, no hiatal hernia, no abdominal pain, no constipation, no diarrhea, no hematochezia, no hematemesis, no melena  GU:   no dysuria,  no frequency, no urinary tract infection, no hematuria, no enlarged prostate, no kidney stones, mild chronic kidney disease  Vascular:  no pain suggestive of claudication, no pain in feet, no leg cramps, no varicose veins, no DVT, no non-healing foot ulcer  Neuro:   no stroke, no TIA's, no seizures, no headaches, no temporary blindness one eye,  no slurred speech, no peripheral neuropathy, no chronic pain, no instability of gait, no memory/cognitive  dysfunction  Musculoskeletal: + arthritis, no joint swelling, no myalgias, no difficulty walking, normal mobility   Skin:   no rash, no itching, no skin infections, no pressure sores or ulcerations  Psych:   no anxiety, no depression, no nervousness, no unusual recent stress  Eyes:   no blurry vision, no floaters, no recent vision changes, + wears glasses or contacts  ENT:   no hearing loss, no loose or painful teeth, no dentures, last saw dentist within the past 6 months  Hematologic:  no easy bruising, no abnormal bleeding, no clotting disorder, no frequent epistaxis  Endocrine:  no diabetes, does not check CBG's at home     Physical Exam:   BP (!) 152/70 (BP Location: Right Arm, Patient Position: Sitting, Cuff Size: Large)   Pulse (!) 50   Resp 16   Ht 5\' 7"  (1.702 m)   Wt 180 lb (81.6 kg)   SpO2 97% Comment: ON RA  BMI 28.19 kg/m   General:    well-appearing  HEENT:  Unremarkable   Neck:   no JVD, no bruits, no adenopathy   Chest:   clear to auscultation, symmetrical breath sounds, no wheezes, no rhonchi   CV:   RRR, grade III/VI systolic and diastolic murmur best  RUSB  Abdomen:  soft, non-tender, no masses   Extremities:  warm, well-perfused, pulses palpable, no LE edema  Rectal/GU  Deferred  Neuro:   Grossly non-focal and symmetrical throughout  Skin:   Clean and dry, no rashes, no breakdown   Diagnostic Tests:  Transthoracic Echocardiography  Patient:    Baker, Moronta MR #:       [id number] Study Date: 12/14/2016 Gender:     M Age:        46 Height:     170.2 cm Weight:     82.9 kg BSA:        2 m^2 Pt. Status: Room:   ATTENDING    Default, Provider [phone]  Owsley End, MD  REFERRING    Nelva Bush, MD  PERFORMING   [place], [place]  SONOGRAPHER  Pilar Jarvis, RVT, RDCS, RDMS  cc:  ------------------------------------------------------------------- LV EF: 55% -   60%  ------------------------------------------------------------------- History:   PMH:   Murmur.  Aortic valve disease.  Mitral valve disease.  Risk factors:  Hypertension.  ------------------------------------------------------------------- Study Conclusions  - Left ventricle: The cavity size was mildly dilated. There was   mild concentric hypertrophy. Systolic function was normal. The   estimated ejection fraction was in the range of 55% to 60%.   Images were inadequate for LV wall motion assessment. Doppler   parameters are consistent with abnormal left ventricular   relaxation (grade 1 diastolic dysfunction). - Aortic valve: There was mild to moderate regurgitation. - Aorta: Aortic root dimension: 37 mm (ED). Ascending aortic   diameter: 49 mm (S). Moderately dilated ascending aorta. - Mitral valve: There was mild to moderate regurgitation. - Left atrium: The atrium was mildly dilated.  ------------------------------------------------------------------- Labs, prior tests, procedures, and surgery: ECG.     Abnormal.  ------------------------------------------------------------------- Study data:  The previous study was  not available, so comparison was made to the report of 10/20/2016.  Study status:  Routine. Procedure:  Transthoracic echocardiography. Image quality was fair.  Study completion:  There were no complications. Transthoracic echocardiography.  M-mode, complete 2D, spectral Doppler, and color Doppler.  Birthdate:  Patient birthdate: 05-Dec-1936.  Age:  Patient is 81 yr old.  Sex:  Gender:  male. BMI: 28.6 kg/m^2.  Blood pressure:     148/80  Patient status: Outpatient.  Study date:  Study date: 12/14/2016. Study time: 10:10 AM.  -------------------------------------------------------------------  ------------------------------------------------------------------- Left ventricle:  The cavity size was mildly dilated. There was mild concentric hypertrophy. Systolic function was normal. The estimated ejection fraction was in the range of 55% to 60%. Images were inadequate for LV wall motion assessment. Doppler parameters are consistent with abnormal left ventricular relaxation (grade 1 diastolic dysfunction).  ------------------------------------------------------------------- Aortic valve:  Poorly visualized.  Probably trileaflet; normal thickness, mildly calcified leaflets. Mobility was not restricted. Doppler:  Transvalvular velocity was within the normal range. There was no stenosis. There was mild to moderate regurgitation.    VTI ratio of LVOT to aortic valve: 1.17. Valve area (VTI): 3.68 cm^2. Indexed valve area (VTI): 1.84 cm^2/m^2. Peak velocity ratio of LVOT to aortic valve: 0.96. Valve area (Vmax): 3.02 cm^2. Indexed valve area (Vmax): 1.51 cm^2/m^2. Mean velocity ratio of LVOT to aortic valve: 0.93. Valve area (Vmean): 2.92 cm^2. Indexed valve area (Vmean): 1.46 cm^2/m^2.    Mean gradient (S): 6 mm Hg. Peak gradient (S): 12 mm Hg.  ------------------------------------------------------------------- Aorta:  Aortic root: The aortic root was normal in  size.  ------------------------------------------------------------------- Mitral valve:   Structurally normal valve.   Mobility was not restricted.  Doppler:  Transvalvular velocity was within the normal range. There was no evidence for stenosis. There was mild to moderate regurgitation.    Peak gradient (D): 3 mm Hg.  ------------------------------------------------------------------- Left atrium:  The atrium was mildly dilated.  ------------------------------------------------------------------- Right ventricle:  The cavity size was normal. Wall thickness was normal. Systolic function was normal.  ------------------------------------------------------------------- Pulmonic valve:    Doppler:  Transvalvular velocity was within the normal range. There was no evidence for stenosis. There was mild regurgitation.  ------------------------------------------------------------------- Tricuspid valve:   Structurally normal valve.    Doppler: Transvalvular velocity was within the normal range. There was trivial regurgitation.  ------------------------------------------------------------------- Pulmonary artery:   The main pulmonary artery was normal-sized. Systolic pressure could not be accurately estimated.  ------------------------------------------------------------------- Right atrium:  The atrium was normal in size.  ------------------------------------------------------------------- Pericardium:  There was no pericardial effusion.  ------------------------------------------------------------------- Systemic veins: Inferior vena cava: The vessel was normal in size.  ------------------------------------------------------------------- Measurements   Left ventricle                           Value          Reference  LV ID, ED, PLAX chordal            (L)   12.4  mm       43 - 52  LV ID, ES, PLAX chordal                  36.5  mm       23 - 38  LV fx shortening, PLAX  chordal     (L)   -194  %        >=29  LV PW thickness, ED                      53.2  mm       ----------  Stroke volume, 2D                        124   ml       ----------  Stroke volume/bsa,  2D                    62    ml/m^2   ----------  LV ejection fraction, 1-p A4C            69    %        ----------  LV end-diastolic volume, 2-p             131   ml       ----------  LV end-systolic volume, 2-p              42    ml       ----------  LV ejection fraction, 2-p                68    %        ----------  Stroke volume, 2-p                       90    ml       ----------  LV end-diastolic volume/bsa, 2-p         65    ml/m^2   ----------  LV end-systolic volume/bsa, 2-p          21    ml/m^2   ----------  Stroke volume/bsa, 2-p                   44.7  ml/m^2   ----------  LV e&', lateral                           8.38  cm/s     ----------  LV E/e&', lateral                         9.61           ----------  LV e&', medial                            5.44  cm/s     ----------  LV E/e&', medial                          14.8           ----------  LV e&', average                           6.91  cm/s     ----------  LV E/e&', average                         11.65          ----------    LVOT                                     Value          Reference  LVOT ID, S                               20    mm       ----------  LVOT area  3.14  cm^2     ----------  LVOT ID                                  20    mm       ----------  LVOT peak velocity, S                    169   cm/s     ----------  LVOT mean velocity, S                    104   cm/s     ----------  LVOT VTI, S                              39.5  cm       ----------  LVOT peak gradient, S                    11    mm Hg    ----------  Stroke volume (SV), LVOT DP              124.1 ml       ----------  Stroke index (SV/bsa), LVOT DP           62    ml/m^2   ----------    Aortic valve                              Value          Reference  Aortic valve peak velocity, S            176   cm/s     ----------  Aortic valve mean velocity, S            112   cm/s     ----------  Aortic valve VTI, S                      33.7  cm       ----------  Aortic mean gradient, S                  6     mm Hg    ----------  Aortic peak gradient, S                  12    mm Hg    ----------  VTI ratio, LVOT/AV                       1.17           ----------  Aortic valve area, VTI                   3.68  cm^2     ----------  Aortic valve area/bsa, VTI               1.84  cm^2/m^2 ----------  Velocity ratio, peak, LVOT/AV            0.96           ----------  Aortic valve area, peak velocity         3.02  cm^2     ----------  Aortic valve area/bsa, peak  1.51  cm^2/m^2 ----------  velocity  Velocity ratio, mean, LVOT/AV            0.93           ----------  Aortic valve area, mean velocity         2.92  cm^2     ----------  Aortic valve area/bsa, mean              1.46  cm^2/m^2 ----------  velocity  Aortic regurg pressure half-time         191   ms       ----------    Aorta                                    Value          Reference  Aortic root ID, ED                       37    mm       ----------  Ascending aorta ID, A-P, S               49    mm       ----------    Left atrium                              Value          Reference  LA ID, A-P, ES                           39    mm       ----------  LA ID/bsa, A-P                           1.95  cm/m^2   <=2.2  LA volume, S                             75.6  ml       ----------  LA volume/bsa, S                         37.8  ml/m^2   ----------  LA volume, ES, 1-p A4C                   60.4  ml       ----------  LA volume/bsa, ES, 1-p A4C               30.2  ml/m^2   ----------  LA volume, ES, 1-p A2C                   81.8  ml       ----------  LA volume/bsa, ES, 1-p A2C               40.9  ml/m^2   ----------    Mitral valve                              Value          Reference  Mitral E-wave peak velocity  80.5  cm/s     ----------  Mitral A-wave peak velocity              129   cm/s     ----------  Mitral deceleration time           (H)   458   ms       150 - 230  Mitral peak gradient, D                  3     mm Hg    ----------  Mitral E/A ratio, peak                   0.6            ----------    Tricuspid valve                          Value          Reference  Tricuspid regurg peak velocity           303   cm/s     ----------  Tricuspid peak RV-RA gradient            37    mm Hg    ----------    Right atrium                             Value          Reference  RA ID, S-I, ES, A4C                (H)   57.7  mm       34 - 49  RA area, ES, A4C                         14.8  cm^2     8.3 - 19.5  RA volume, ES, A/L                       30.8  ml       ----------  RA volume/bsa, ES, A/L                   15.4  ml/m^2   ----------    Right ventricle                          Value          Reference  TAPSE                                    23    mm       ----------  RV s&', lateral, S                        19.8  cm/s     ----------    Pulmonic valve                           Value          Reference  Pulmonic valve peak velocity, S          88.8  cm/s     ----------  Legend: (L)  and  (H)  mark values outside specified reference range.  ------------------------------------------------------------------- Prepared and Electronically Authenticated by  Kathlyn Sacramento, MD 2018-01-10T12:33:47   MRA CHEST WITH OR WITHOUT CONTRAST  TECHNIQUE: Angiographic images of the chest were obtained using MRA technique without intravenous contrast.  CONTRAST:  Contrast was not administered due to underlying chronic kidney disease.  COMPARISON:  None.  FINDINGS: VASCULAR  Aorta: There is aneurysmal disease of the ascending thoracic aorta. At the level of the sinuses of Valsalva, the aorta measures up  to 4.3 cm in greatest measured diameter. The aortic valve is trileaflet. Above the sinuses of Valsalva, the aorta reaches maximal measured diameter of approximately 4.9 cm. The proximal arch measures 3.8 cm. The distal arch measures 2.9 cm. The descending thoracic aorta measures 2.6 cm. No evidence of aortic dissection. Proximal great vessels show normal branching anatomy.  Heart: The heart appears mildly enlarged no significant pericardial fluid identified.  Pulmonary Arteries: Central pulmonary arteries appear to be normal in caliber.  NON-VASCULAR  No significant nonvascular findings. No incidental lymphadenopathy or masses. Visualized bony structures are unremarkable.  IMPRESSION: Aneurysmal disease of the ascending thoracic aorta. At the level of the sinuses of Valsalva, mild dilatation is present with the aorta measuring 4.3 cm up to 4.3 cm. The ascending thoracic aorta reaches a maximal caliber of 4.9 cm. There is no evidence of aortic dissection.   Electronically Signed   By: Aletta Edouard M.D.   On: 12/27/2016 16:03   MRA CHEST WITH OR WITHOUT CONTRAST  TECHNIQUE: Angiographic images of the chest were obtained using MRA technique without intravenous contrast.  CONTRAST:  None  COMPARISON:  MR 12/27/2016  FINDINGS: VASCULAR  Aorta: Thoracic aortic aneurysm again demonstrated, with MR performed today without gadolinium contrast given chronic renal insufficiency.  The overall configuration and size of the thoracic abdominal aorta appears unchanged since the MR dated 12/27/2016. There are no 2 sequences performed on today's study on which a reproducible diameter can be measured at the annulus, sino-tubular junction, or ascending aorta.  The annulus estimated approximately 3.8 cm  The sino-tubular junction estimated 5.0 cm.  Greatest diameter of the ascending aorta measured at the level of main pulmonary artery is approximately 4.8 cm.  Proximal descending thoracic aorta measures 3.0 cm.  Diameter at the aortic hiatus measures approximately 2.7 cm  Three vessel arch.  No periaortic fluid or dissection flap.  Unremarkable diameter of the main pulmonary artery.  Unchanged appearance of far with no pericardial fluid.  NON-VASCULAR  Unremarkable appearance of the musculoskeletal structures.  Unremarkable appearance of the lungs.  Unremarkable appearance of mediastinal structures with no adenopathy.  IMPRESSION: Re- demonstration of ascending aortic aneurysm, which, within the limits of the study, is relatively unchanged from the MR dated 12/27/2016. Although no 2 sequences performed on the current study allow a reproducible diameter of the aortic root or the ascending aorta, estimated diameter at the sino-tubular junction is 5.0 cm and at the level of the main pulmonary artery 4.8 cm.  Signed,  Dulcy Fanny. Earleen Newport, DO  [facility]  [facility]   Electronically Signed   By: Corrie Mckusick D.O.   On: 06/06/2017 12:17   Impression:  Patient has long-standing hypertension with an ascending thoracic aortic aneurysm and at least moderate aortic insufficiency. He describes stable symptoms of decreased energy with mild exertional shortness of breath and chronic bilateral lower extremity edema consistent with chronic diastolic congestive heart failure, [place] Heart  Association functional class I-II.  I have personally reviewed the patient's most recent transthoracic echocardiogram and his last 2 MR angiograms of the thoracic aorta. Echocardiogram performed last January reveals normal left ventricular systolic function. The aortic valve appears trileaflet. The aortic annulus is dilated. There is at least moderate aortic insufficiency. Apical views suggest that the aortic insufficiency may be more severe, and the diastolic pressure half-time was reported  quite low at 191 ms. Left ventricular size and systolic function appears normal. MR angiogram confirms the presence of aneurysmal enlargement of the aortic root and entire ascending thoracic aorta. The maximum transverse diameter approaches 5 cm but appears to remain essentially stable between the 2 exams performed in January and July of this year.  At present there are no clear indications for surgical intervention, but the patient clearly should be followed closely and I suspect he will likely need aortic valve repair or replacement with resection and grafting of his ascending thoracic aortic aneurysm at some point in the not-too-distant future.   Plan:  I recommend transesophageal echocardiogram to further characterize the functional anatomy and severity of the patient's underlying aortic insufficiency. Presuming that he does not have severe aortic insufficiency at this time, I favor continued watchful waiting. Measurement of the aortic root and proximal ascending thoracic aorta can be fraught with inaccuracy in the setting of a non-gated scan, and it might make sense to obtain a cardiac gated CT angiogram as a next follow-up scan to reevaluate the size of his aortic aneurysm. We will plan to do this next January presuming that his renal function remained stable. Because of mild chronic kidney disease he will need IV hydration prior to administration of IV contrast for CT angiogram.  He will return to our office next January after his CT angiogram has been performed. In the mild patient has been reminded how important it will remain for him to continue to monitor his blood pressure closely and seek assistance from Dr. Saunders Revel as needed to adjust his blood pressure medications accordingly. All of his questions have been addressed.   I spent in excess of 90 minutes during the conduct of this office consultation and >50% of this time involved direct face-to-face encounter with the patient for counseling and/or  coordination of their care.    Valentina Gu. Roxy Manns, MD 09/05/2017 11:52 AM

## 2017-09-05 NOTE — Patient Instructions (Signed)
Continue all previous medications without any changes at this time  Continue to monitor your blood pressure regularly and contact Dr Darnelle Bos office as needed to further adjust your blood pressure medications

## 2017-09-20 ENCOUNTER — Ambulatory Visit (INDEPENDENT_AMBULATORY_CARE_PROVIDER_SITE_OTHER): Payer: Medicare HMO | Admitting: Internal Medicine

## 2017-09-20 ENCOUNTER — Encounter: Payer: Self-pay | Admitting: Internal Medicine

## 2017-09-20 ENCOUNTER — Other Ambulatory Visit: Payer: Self-pay | Admitting: *Deleted

## 2017-09-20 VITALS — BP 130/70 | HR 54 | Ht 67.0 in | Wt 178.5 lb

## 2017-09-20 DIAGNOSIS — I712 Thoracic aortic aneurysm, without rupture, unspecified: Secondary | ICD-10-CM

## 2017-09-20 DIAGNOSIS — I10 Essential (primary) hypertension: Secondary | ICD-10-CM

## 2017-09-20 DIAGNOSIS — I351 Nonrheumatic aortic (valve) insufficiency: Secondary | ICD-10-CM | POA: Diagnosis not present

## 2017-09-20 DIAGNOSIS — I701 Atherosclerosis of renal artery: Secondary | ICD-10-CM | POA: Diagnosis not present

## 2017-09-20 MED ORDER — DOXAZOSIN MESYLATE 2 MG PO TABS: 2.0000 mg | ORAL_TABLET | Freq: Every day | ORAL | 3 refills | Status: DC

## 2017-09-20 MED ORDER — ENALAPRIL MALEATE 20 MG PO TABS: 20.0000 mg | ORAL_TABLET | Freq: Two times a day (BID) | ORAL | 1 refills | Status: DC

## 2017-09-20 NOTE — Patient Instructions (Signed)
Medication Instructions:  Your physician recommends that you continue on your current medications as directed. Please refer to the Current Medication list given to you today.   Labwork: Your physician recommends that you return for lab work in: [place] September 26, 2017. - Please go to the [facility]. You will check in at the front desk to the right as you walk into the atrium. Valet Parking is offered if needed.     Testing/Procedures: Your physician has requested that you have a TEE. During a TEE, sound waves are used to create images of your heart. It provides your doctor with information about the size and shape of your heart and how well your heart's chambers and valves are working. In this test, a transducer is attached to the end of a flexible tube that's guided down your throat and into your esophagus (the tube leading from you mouth to your stomach) to get a more detailed image of your heart. You are not awake for the procedure. Please see the instruction sheet given to you today. For further information please visit [email or web address].   You are scheduled for a TEE on 10/07/17 with Dr. Ena Dawley.  1. Please arrive at the [facility] (Main Entrance A) at [facility]: [address] [place], [place] [address] at 07:00 am (two hours before your procedure to ensure your preparation). Free valet parking service is available.   Special note: Every effort is made to have your procedure done on time. Please understand that emergencies sometimes delay scheduled procedures.  2. Diet: Do not eat or drink anything after midnight prior to your procedure except sips of water to take medications.  3. Labs: CBC, BMP, PT/INR   4. Medication instructions in preparation for your procedure:   On the morning of your procedure, take your ALL YOUR MEDICATIONS THE MORNING.  You may use sips of water.  5. Plan for one night stay--bring personal belongings.  6. Bring  a current list of your medications and current insurance cards.  7. You MUST have a responsible person to drive you home.  8. Someone MUST be with you the first 24 hours after you arrive home or your discharge will be delayed.  9. Please wear clothes that are easy to get on and off and wear slip-on shoes.  Thank you for allowing [place] to care for you!   -- [facility]   Follow-Up: Your physician recommends that you schedule a follow-up appointment in: 3 MONTHS WITH DR END.   If you need a refill on your cardiac medications before your next appointment, please call your pharmacy.     Transesophageal Echocardiogram Transesophageal echocardiography (TEE) is a picture test of your heart using sound waves. The pictures taken can give very detailed pictures of your heart. This can help your doctor see if there are problems with your heart. TEE can check:  If your heart has blood clots in it.  How well your heart valves are working.  If you have an infection on the inside of your heart.  Some of the major arteries of your heart.  If your heart valve is working after a Office manager.  Your heart before a procedure that uses a shock to your heart to get the rhythm back to normal.  What happens before the procedure?  Do not eat or drink for 6 hours before the procedure or as told by your doctor.  Make plans to have someone drive  you home after the procedure. Do not drive yourself home.  An IV tube will be put in your arm. What happens during the procedure?  You will be given a medicine to help you relax (sedative). It will be given through the IV tube.  A numbing medicine will be sprayed or gargled in the back of your throat to help numb it.  The tip of the probe is placed into the back of your mouth. You will be asked to swallow. This helps to pass the probe into your esophagus.  Once the tip of the probe is in the right place, your doctor can take  pictures of your heart.  You may feel pressure at the back of your throat. What happens after the procedure?  You will be taken to a recovery area so the sedative can wear off.  Your throat may be sore and scratchy. This will go away slowly over time.  You will go home when you are fully awake and able to swallow liquids.  You should have someone stay with you for the next 24 hours.  Do not drive or operate machinery for the next 24 hours. This information is not intended to replace advice given to you by your health care provider. Make sure you discuss any questions you have with your health care provider. Document Released: 09/19/2009 Document Revised: 04/29/2016 Document Reviewed: 05/24/2013 Elsevier Interactive Patient Education  [employer].

## 2017-09-20 NOTE — Progress Notes (Signed)
Follow-up Outpatient Visit Date: 09/20/2017  Primary Care Provider: Remi Haggard, [place] [address]  Chief Complaint: Follow-up hypertension, TAA, and aortic regurgitation  HPI:  Rodney Wiley is a 81 y.o. year-old male with history of  hypertension, aortic regurgitation, thoracic aortic aneurysm, and chronic kidney disease stage 3 with left renal artery stenosis, who presents for follow-up of hypertension, TAA, and aortic regurgitation. Today, Rodney Wiley reports doing well. He has not had any chest pain or shortness of breath. He notes mild leg edema but has had a stable weight and has not needed to use any of his as needed furosemide. Blood pressures have been improved and are typically less than 140/90. He denies lightheadedness and palpitations. He is tolerating his current medication regimen well.  After our last visit, Rodney Wiley was evaluated by Dr. Roxy Manns of cardiac surgery. Dr. Roxy Manns recommended TEE to better characterize severity of aortic regurgitation. If AI is not severe, Dr. Roxy Manns would prefer surveillance of the ascending aortic aneurysm with gated CTA of the chest in January. TEE has yet to be scheduled.  --------------------------------------------------------------------------------------------------  Cardiovascular History & Procedures: Cardiovascular Problems:  Thoracic aortic aneurysm  Aortic regurgitation  Mitral regurgitation  Renal artery stenosis  Risk Factors:  Hypertension, hyperlipidemia, male gender, and age > 73  Cath/PCI:  None  CV Surgery:  None  EP Procedures and Devices:  None  Non-Invasive Evaluation(s):  MRA chest (06/06/17): Stable ascending aortic aneurysm, measuring 5.0 cm at the sinotubular junction and 4.8 cm at the level of the main pulmonary artery.  Renal artery Doppler (01/13/17): Aortic atherosclerosis without stenosis or aneurysm. Normal right renal artery. Mild to moderate left renal artery  stenosis (1-59%). Normal/symmetric kidney size.  MRA chest (12/27/16): Aneurysmal disease of the ascending aorta, measuring 4.3 cm at the sinus of Valsalva and 4.9 cm above the sinotubular junction.  TTE (12/14/16): Mildly dilated LV with mild LVH. Normal contraction with LVEF 55-60%. Grade 1 diastolic dysfunction. Mild to moderate aortic regurgitation. Dilated aorta, measuring up to 4.9 cm above the sinotubular junction. Mild to moderate mitral regurgitation. Mild left atrial enlargement. Normal RV size and function.  Transthoracic echo (09/2016, [place]): Full report not available. Reportedly showed LVH with LVEF 78% and diastolic dysfunction. MR and moderate to severe AI present with sclerotic aortic valve.  Carotid Doppler (10/201&): Full report not available. Reportedly showed 1-39% stenosis bilaterally.  Transthoracic echo (02/18/16, [facility]): Normal LV and RV size and function. LVEF >55%. Moderate AI; mild MR and TR.  Recent CV Pertinent Labs: Lab Results  Component Value Date   CHOL 106 03/11/2017   CHOL 186 01/13/2017   HDL 28 (L) 03/11/2017   HDL 30 (L) 01/13/2017   LDLCALC 39 03/11/2017   LDLCALC 95 01/13/2017   TRIG 194 (H) 03/11/2017   CHOLHDL 3.8 03/11/2017   K 4.0 06/02/2017   BUN 33 (H) 06/02/2017   BUN 27 03/22/2017   CREATININE 1.68 (H) 06/02/2017    Past medical and surgical history were reviewed and updated in EPIC.  Current Meds  Medication Sig  . amLODipine (NORVASC) 10 MG tablet Take 1 tablet (10 mg total) by mouth daily.  [name] Kitchen aspirin EC 81 MG tablet Take 1 tablet (81 mg total) by mouth daily.  [name] Kitchen atorvastatin (LIPITOR) 20 MG tablet Take 1 tablet (20 mg total) by mouth daily.  . carvedilol (COREG) 25 MG tablet Take 1 tablet (25 mg total) by mouth 2 (two) times daily.  [name] Kitchen  Cholecalciferol (VITAMIN D3) 2000 units capsule Take 2,000 Units by mouth daily.   [name] Kitchen doxazosin (CARDURA) 2 MG tablet Take 1 tablet (2 mg total) by mouth daily.    . enalapril (VASOTEC) 20 MG tablet Take 1 tablet (20 mg total) by mouth 2 (two) times daily.  . ferrous sulfate 325 (65 FE) MG EC tablet Take 325 mg by mouth daily with breakfast.  . furosemide (LASIX) 40 MG tablet Take 1 tablet (40 mg total) by mouth daily.  . Omega-3 Fatty Acids (FISH OIL) 1200 MG CAPS Take 1 capsule by mouth daily.    Allergies: Patient has no known allergies.  Social History   Social History  . Marital status: Single    Spouse name: N/A  . Number of children: N/A  . Years of education: N/A   Occupational History  . Not on file.   Social History Main Topics  . Smoking status: Never Smoker  . Smokeless tobacco: Never Used  . Alcohol use No  . Drug use: No  . Sexual activity: Not on file   Other Topics Concern  . Not on file   Social History Narrative  . No narrative on file    Family History  Problem Relation Age of Onset  . Hypertension Mother   . Leukemia Father   . Heart attack Paternal Grandmother   . Stroke Paternal Grandfather   . Heart Problems Son 83  . Heart Problems Son 51    Review of Systems: A 12-system review of systems was performed and was negative except as noted in the HPI.  --------------------------------------------------------------------------------------------------  Physical Exam: BP 130/70 (BP Location: Left Arm, Patient Position: Sitting, Cuff Size: Normal)   Pulse (!) 54   Ht 5\' 7"  (1.702 m)   Wt 178 lb 8 oz (81 kg)   BMI 27.96 kg/m   General:  Well-developed, well-nourished man, seated comfortably in the exam room. HEENT: No conjunctival pallor or scleral icterus. Moist mucous membranes.  OP clear. Neck: Supple without lymphadenopathy, thyromegaly, JVD, or HJR. No carotid bruit. Lungs: Normal work of breathing. Clear to auscultation bilaterally without wheezes or crackles. Heart: Regular rate and rhythm with 1/6 systolic and early diastolic murmurs. No rubs or gallops. Non-displaced PMI. Abd: Bowel sounds  present. Soft, NT/ND without hepatosplenomegaly Ext: Trace pretibial edema. Radial, PT, and DP pulses are 2+ bilaterally. Skin: Warm and dry without rash.  EKG:  Sinus bradycardia (heart rate 52 bpm) with first-degree AV block. Otherwise, no significant abnormalities.  No results found for: WBC, HGB, HCT, MCV, PLT  Lab Results  Component Value Date   NA 136 06/02/2017   K 4.0 06/02/2017   CL 108 06/02/2017   CO2 22 06/02/2017   BUN 33 (H) 06/02/2017   CREATININE 1.68 (H) 06/02/2017   GLUCOSE 120 (H) 06/02/2017   ALT 19 03/11/2017    Lab Results  Component Value Date   CHOL 106 03/11/2017   HDL 28 (L) 03/11/2017   LDLCALC 39 03/11/2017   TRIG 194 (H) 03/11/2017   CHOLHDL 3.8 03/11/2017   --------------------------------------------------------------------------------------------------  ASSESSMENT AND PLAN: Aortic regurgitation No symptoms of heart failure. Echo in January was notable for mild to moderate aortic regurgitation, though other outside studies have suggested more significant AI. I agree with Dr. Roxy Manns that further characterization with TEE may be helpful. We will make arrangements for this to be done in [facility] at the patient's earliest convenience.  Thoracic aortic aneurysm Rodney Wiley remains asymptomatic. His blood pressure is  better controlled today. We will continue with blood pressure control and surveillance, as recommended by Dr. Roxy Manns. We will also continue with low-dose aspirin and atorvastatin.  Hypertension Blood pressure is reasonably well controlled today. Home readings have also been better since our last visit. We will continue our current medication regimen of amlodipine, carvedilol, enalapril, and doxazosin.  Renal artery stenosis Continue medical therapy. We will discuss repeating renal artery Doppler at our next visit to evaluate for progression of disease.  Follow-up: Return to clinic in 3 months.  Nelva Bush,  MD 09/20/2017 1:48 PM

## 2017-09-23 ENCOUNTER — Telehealth: Payer: Self-pay | Admitting: *Deleted

## 2017-09-23 NOTE — Telephone Encounter (Signed)
No answer. Left message to call back.   

## 2017-09-23 NOTE — Telephone Encounter (Signed)
Patient agreeable to having TEE rescheduled to 09/27/17.  Called scheduling and it was moved to 09/27/17 at 0900 am.  Patient verbalized understanding of date change and to still arrive at 0700 am to [facility].  Patient will get lab work Saturday morning.

## 2017-09-23 NOTE — Telephone Encounter (Signed)
Thanks for your help.  Nelva Bush, MD [facility] Pager: [phone]

## 2017-09-23 NOTE — Telephone Encounter (Signed)
-----   Message from Nelva Bush, MD sent at 09/21/2017  4:12 PM EDT ----- Regarding: FW: TEE Hi Kerri Perches,  It sounds like Dr. Roxy Manns would like to expedite Mr. Welge's TEE. Do you mind seeing if we can move [place] his TEE to either of the dates listed in Dr. Francesca Oman message listed below? Thanks.  Gerald Stabs  ----- Message ----- From: Dorothy Spark, MD Sent: 09/20/2017   3:17 PM To: Nelva Bush, MD, Rexene Alberts, MD, # Subject: RE: TEE                                        I am also doing TEEs on 10/22 and 10/23, did you want to reschedule for those dates?  ----- Message ----- From: Nelva Bush, MD Sent: 09/20/2017   3:07 PM To: Dorothy Spark, MD, Rexene Alberts, MD Subject: RE: TEE                                        We were able to schedule him with [place] [facility] for 11/2. I hope that works. Thanks.  Gerald Stabs  ----- Message ----- From: Rexene Alberts, MD Sent: 09/20/2017   2:38 PM To: Nelva Bush, MD, Dorothy Spark, MD Subject: TEE                                            [place] Si[facility] - can you get this patient in for TEE soon?  He has an ascending aortic aneurysm and AI.  The question is how severe is it the AI?  He doesn't need surgery now unless severe AI etc - Thx  Cub  ----- Message ----- From: Nelva Bush, MD Sent: 09/20/2017   1:58 PM To: Rexene Alberts, MD  Hi Dr. Roxy Manns,  I saw Mr. Ballantine for follow-up today. He is doing well but has not heard anything about the TEE you had mentioned during his recent visit. I will try to arrange for Ena Dawley to do the study in [place] in the next few weeks, unless you prefer something different. Please let me know if you have any questions or concerns.  Gerald Stabs

## 2017-09-24 ENCOUNTER — Other Ambulatory Visit
Admission: RE | Admit: 2017-09-24 | Discharge: 2017-09-24 | Disposition: A | Discharge status: Home / Self Care | Payer: Medicare HMO | Source: Ambulatory Visit | Attending: Internal Medicine | Admitting: Internal Medicine

## 2017-09-24 DIAGNOSIS — I10 Essential (primary) hypertension: Secondary | ICD-10-CM | POA: Insufficient documentation

## 2017-09-24 DIAGNOSIS — I701 Atherosclerosis of renal artery: Secondary | ICD-10-CM | POA: Insufficient documentation

## 2017-09-24 DIAGNOSIS — I351 Nonrheumatic aortic (valve) insufficiency: Secondary | ICD-10-CM

## 2017-09-24 DIAGNOSIS — I712 Thoracic aortic aneurysm, without rupture, unspecified: Secondary | ICD-10-CM

## 2017-09-24 LAB — CBC WITH DIFFERENTIAL/PLATELET
Basophils Absolute: 0 10*3/uL (ref 0–0.1)
Basophils Relative: 1 %
Eosinophils Absolute: 0.2 10*3/uL (ref 0–0.7)
Eosinophils Relative: 5 %
HCT: 34.9 % — ABNORMAL LOW (ref 40.0–52.0)
Hemoglobin: 12 g/dL — ABNORMAL LOW (ref 13.0–18.0)
Lymphocytes Relative: 21 %
Lymphs Abs: 1 10*3/uL (ref 1.0–3.6)
MCH: 32.7 pg (ref 26.0–34.0)
MCHC: 34.5 g/dL (ref 32.0–36.0)
MCV: 94.6 fL (ref 80.0–100.0)
Monocytes Absolute: 0.4 10*3/uL (ref 0.2–1.0)
Monocytes Relative: 9 %
Neutro Abs: 3 10*3/uL (ref 1.4–6.5)
Neutrophils Relative %: 64 %
Platelets: 140 10*3/uL — ABNORMAL LOW (ref 150–440)
RBC: 3.68 MIL/uL — ABNORMAL LOW (ref 4.40–5.90)
RDW: 13.4 % (ref 11.5–14.5)
WBC: 4.6 10*3/uL (ref 3.8–10.6)

## 2017-09-24 LAB — BASIC METABOLIC PANEL
Anion gap: 7 (ref 5–15)
BUN: 25 mg/dL — ABNORMAL HIGH (ref 6–20)
CO2: 22 mmol/L (ref 22–32)
Calcium: 8.8 mg/dL — ABNORMAL LOW (ref 8.9–10.3)
Chloride: 107 mmol/L (ref 101–111)
Creatinine, Ser: 1.74 mg/dL — ABNORMAL HIGH (ref 0.61–1.24)
GFR calc Af Amer: 41 mL/min — ABNORMAL LOW (ref >60)
GFR calc non Af Amer: 36 mL/min — ABNORMAL LOW (ref >60)
Glucose, Bld: 110 mg/dL — ABNORMAL HIGH (ref 65–99)
Potassium: 4 mmol/L (ref 3.5–5.1)
Sodium: 136 mmol/L (ref 135–145)

## 2017-09-24 LAB — PROTIME-INR
INR: 1.16
Prothrombin Time: 14.7 seconds (ref 11.4–15.2)

## 2017-09-26 ENCOUNTER — Other Ambulatory Visit: Payer: Self-pay | Admitting: Internal Medicine

## 2017-09-26 DIAGNOSIS — I351 Nonrheumatic aortic (valve) insufficiency: Secondary | ICD-10-CM

## 2017-09-27 ENCOUNTER — Encounter (HOSPITAL_COMMUNITY): Payer: Self-pay | Admitting: *Deleted

## 2017-09-27 ENCOUNTER — Ambulatory Visit (HOSPITAL_COMMUNITY)
Admission: RE | Admit: 2017-09-27 | Discharge: 2017-09-27 | Disposition: A | Discharge status: Home / Self Care | Payer: Medicare HMO | Source: Ambulatory Visit | Attending: Cardiology | Admitting: Cardiology

## 2017-09-27 ENCOUNTER — Encounter (HOSPITAL_COMMUNITY)
Admission: RE | Disposition: A | Discharge status: Home / Self Care | Payer: Self-pay | Source: Ambulatory Visit | Attending: Cardiology

## 2017-09-27 ENCOUNTER — Ambulatory Visit (HOSPITAL_BASED_OUTPATIENT_CLINIC_OR_DEPARTMENT_OTHER): Payer: Medicare HMO

## 2017-09-27 DIAGNOSIS — I351 Nonrheumatic aortic (valve) insufficiency: Secondary | ICD-10-CM

## 2017-09-27 DIAGNOSIS — N183 Chronic kidney disease, stage 3 (moderate): Secondary | ICD-10-CM | POA: Diagnosis not present

## 2017-09-27 DIAGNOSIS — Z79899 Other long term (current) drug therapy: Secondary | ICD-10-CM | POA: Diagnosis not present

## 2017-09-27 DIAGNOSIS — I700 Atherosclerosis of aorta: Secondary | ICD-10-CM | POA: Diagnosis not present

## 2017-09-27 DIAGNOSIS — I712 Thoracic aortic aneurysm, without rupture: Secondary | ICD-10-CM | POA: Insufficient documentation

## 2017-09-27 DIAGNOSIS — E785 Hyperlipidemia, unspecified: Secondary | ICD-10-CM | POA: Diagnosis not present

## 2017-09-27 DIAGNOSIS — I083 Combined rheumatic disorders of mitral, aortic and tricuspid valves: Secondary | ICD-10-CM | POA: Diagnosis not present

## 2017-09-27 DIAGNOSIS — Z8249 Family history of ischemic heart disease and other diseases of the circulatory system: Secondary | ICD-10-CM | POA: Diagnosis not present

## 2017-09-27 DIAGNOSIS — I080 Rheumatic disorders of both mitral and aortic valves: Secondary | ICD-10-CM | POA: Diagnosis present

## 2017-09-27 DIAGNOSIS — Z7982 Long term (current) use of aspirin: Secondary | ICD-10-CM | POA: Diagnosis not present

## 2017-09-27 DIAGNOSIS — I129 Hypertensive chronic kidney disease with stage 1 through stage 4 chronic kidney disease, or unspecified chronic kidney disease: Secondary | ICD-10-CM | POA: Insufficient documentation

## 2017-09-27 DIAGNOSIS — I701 Atherosclerosis of renal artery: Secondary | ICD-10-CM | POA: Insufficient documentation

## 2017-09-27 HISTORY — PX: TEE WITHOUT CARDIOVERSION: SHX5443

## 2017-09-27 LAB — ECHO TEE
LVOT area: 2.54 cm2
LVOT diameter: 18 mm
P 1/2 time: 374 ms

## 2017-09-27 SURGERY — ECHOCARDIOGRAM, TRANSESOPHAGEAL
Anesthesia: Moderate Sedation

## 2017-09-27 MED ORDER — MIDAZOLAM HCL 5 MG/ML IJ SOLN
INTRAMUSCULAR | Status: AC
  Filled 2017-09-27: qty 2

## 2017-09-27 MED ORDER — MIDAZOLAM HCL 10 MG/2ML IJ SOLN
INTRAMUSCULAR | Status: DC | PRN
  Administered 2017-09-27 (×2): 2 mg via INTRAVENOUS
  Administered 2017-09-27: 1 mg via INTRAVENOUS

## 2017-09-27 MED ORDER — FENTANYL CITRATE (PF) 100 MCG/2ML IJ SOLN
INTRAMUSCULAR | Status: DC | PRN
  Administered 2017-09-27 (×2): 25 ug via INTRAVENOUS

## 2017-09-27 MED ORDER — FENTANYL CITRATE (PF) 100 MCG/2ML IJ SOLN
INTRAMUSCULAR | Status: AC
  Filled 2017-09-27: qty 2

## 2017-09-27 MED ORDER — SODIUM CHLORIDE 0.9 % IV SOLN: INTRAVENOUS | Status: DC

## 2017-09-27 MED ORDER — BUTAMBEN-TETRACAINE-BENZOCAINE 2-2-14 % EX AERO
INHALATION_SPRAY | CUTANEOUS | Status: DC | PRN
  Administered 2017-09-27: 2 via TOPICAL

## 2017-09-27 MED ORDER — SODIUM CHLORIDE 0.9 % IV SOLN
INTRAVENOUS | Status: AC | PRN
  Administered 2017-09-27: 500 mL via INTRAVENOUS

## 2017-09-27 NOTE — Interval H&P Note (Signed)
History and Physical Interval Note:  09/27/2017 7:58 AM  [name]  has presented today for surgery, with the diagnosis of AORTIC REGURGITATION  The various methods of treatment have been discussed with the patient and family. After consideration of risks, benefits and other options for treatment, the patient has consented to  Procedure(s): TRANSESOPHAGEAL ECHOCARDIOGRAM (TEE) (N/A) as a surgical intervention .  The patient's history has been reviewed, patient examined, no change in status, stable for surgery.  I have reviewed the patient's chart and labs.  Questions were answered to the patient's satisfaction.     Rodney Wiley

## 2017-09-27 NOTE — H&P (View-Only) (Signed)
Follow-up Outpatient Visit Date: 09/20/2017  Primary Care Provider: Remi Haggard, [place] [address]  Chief Complaint: Follow-up hypertension, TAA, and aortic regurgitation  HPI:  Rodney Wiley is a 81 y.o. year-old male with history of  hypertension, aortic regurgitation, thoracic aortic aneurysm, and chronic kidney disease stage 3 with left renal artery stenosis, who presents for follow-up of hypertension, TAA, and aortic regurgitation. Today, Rodney Wiley reports doing well. He has not had any chest pain or shortness of breath. He notes mild leg edema but has had a stable weight and has not needed to use any of his as needed furosemide. Blood pressures have been improved and are typically less than 140/90. He denies lightheadedness and palpitations. He is tolerating his current medication regimen well.  After our last visit, Rodney Wiley was evaluated by Dr. Roxy Manns of cardiac surgery. Dr. Roxy Manns recommended TEE to better characterize severity of aortic regurgitation. If AI is not severe, Dr. Roxy Manns would prefer surveillance of the ascending aortic aneurysm with gated CTA of the chest in January. TEE has yet to be scheduled.  --------------------------------------------------------------------------------------------------  Cardiovascular History & Procedures: Cardiovascular Problems:  Thoracic aortic aneurysm  Aortic regurgitation  Mitral regurgitation  Renal artery stenosis  Risk Factors:  Hypertension, hyperlipidemia, male gender, and age > 5  Cath/PCI:  None  CV Surgery:  None  EP Procedures and Devices:  None  Non-Invasive Evaluation(s):  MRA chest (06/06/17): Stable ascending aortic aneurysm, measuring 5.0 cm at the sinotubular junction and 4.8 cm at the level of the main pulmonary artery.  Renal artery Doppler (01/13/17): Aortic atherosclerosis without stenosis or aneurysm. Normal right renal artery. Mild to moderate left renal artery  stenosis (1-59%). Normal/symmetric kidney size.  MRA chest (12/27/16): Aneurysmal disease of the ascending aorta, measuring 4.3 cm at the sinus of Valsalva and 4.9 cm above the sinotubular junction.  TTE (12/14/16): Mildly dilated LV with mild LVH. Normal contraction with LVEF 55-60%. Grade 1 diastolic dysfunction. Mild to moderate aortic regurgitation. Dilated aorta, measuring up to 4.9 cm above the sinotubular junction. Mild to moderate mitral regurgitation. Mild left atrial enlargement. Normal RV size and function.  Transthoracic echo (09/2016, [place]): Full report not available. Reportedly showed LVH with LVEF 53% and diastolic dysfunction. MR and moderate to severe AI present with sclerotic aortic valve.  Carotid Doppler (10/201&): Full report not available. Reportedly showed 1-39% stenosis bilaterally.  Transthoracic echo (02/18/16, [facility]): Normal LV and RV size and function. LVEF >55%. Moderate AI; mild MR and TR.  Recent CV Pertinent Labs: Lab Results  Component Value Date   CHOL 106 03/11/2017   CHOL 186 01/13/2017   HDL 28 (L) 03/11/2017   HDL 30 (L) 01/13/2017   LDLCALC 39 03/11/2017   LDLCALC 95 01/13/2017   TRIG 194 (H) 03/11/2017   CHOLHDL 3.8 03/11/2017   K 4.0 06/02/2017   BUN 33 (H) 06/02/2017   BUN 27 03/22/2017   CREATININE 1.68 (H) 06/02/2017    Past medical and surgical history were reviewed and updated in EPIC.  Current Meds  Medication Sig  . amLODipine (NORVASC) 10 MG tablet Take 1 tablet (10 mg total) by mouth daily.  [name] Kitchen aspirin EC 81 MG tablet Take 1 tablet (81 mg total) by mouth daily.  [name] Kitchen atorvastatin (LIPITOR) 20 MG tablet Take 1 tablet (20 mg total) by mouth daily.  . carvedilol (COREG) 25 MG tablet Take 1 tablet (25 mg total) by mouth 2 (two) times daily.  [name] Kitchen  Cholecalciferol (VITAMIN D3) 2000 units capsule Take 2,000 Units by mouth daily.   [name] Kitchen doxazosin (CARDURA) 2 MG tablet Take 1 tablet (2 mg total) by mouth daily.    . enalapril (VASOTEC) 20 MG tablet Take 1 tablet (20 mg total) by mouth 2 (two) times daily.  . ferrous sulfate 325 (65 FE) MG EC tablet Take 325 mg by mouth daily with breakfast.  . furosemide (LASIX) 40 MG tablet Take 1 tablet (40 mg total) by mouth daily.  . Omega-3 Fatty Acids (FISH OIL) 1200 MG CAPS Take 1 capsule by mouth daily.    Allergies: Patient has no known allergies.  Social History   Social History  . Marital status: Single    Spouse name: N/A  . Number of children: N/A  . Years of education: N/A   Occupational History  . Not on file.   Social History Main Topics  . Smoking status: Never Smoker  . Smokeless tobacco: Never Used  . Alcohol use No  . Drug use: No  . Sexual activity: Not on file   Other Topics Concern  . Not on file   Social History Narrative  . No narrative on file    Family History  Problem Relation Age of Onset  . Hypertension Mother   . Leukemia Father   . Heart attack Paternal Grandmother   . Stroke Paternal Grandfather   . Heart Problems Son 32  . Heart Problems Son 31    Review of Systems: A 12-system review of systems was performed and was negative except as noted in the HPI.  --------------------------------------------------------------------------------------------------  Physical Exam: BP 130/70 (BP Location: Left Arm, Patient Position: Sitting, Cuff Size: Normal)   Pulse (!) 54   Ht 5\' 7"  (1.702 m)   Wt 178 lb 8 oz (81 kg)   BMI 27.96 kg/m   General:  Well-developed, well-nourished man, seated comfortably in the exam room. HEENT: No conjunctival pallor or scleral icterus. Moist mucous membranes.  OP clear. Neck: Supple without lymphadenopathy, thyromegaly, JVD, or HJR. No carotid bruit. Lungs: Normal work of breathing. Clear to auscultation bilaterally without wheezes or crackles. Heart: Regular rate and rhythm with 1/6 systolic and early diastolic murmurs. No rubs or gallops. Non-displaced PMI. Abd: Bowel sounds  present. Soft, NT/ND without hepatosplenomegaly Ext: Trace pretibial edema. Radial, PT, and DP pulses are 2+ bilaterally. Skin: Warm and dry without rash.  EKG:  Sinus bradycardia (heart rate 52 bpm) with first-degree AV block. Otherwise, no significant abnormalities.  No results found for: WBC, HGB, HCT, MCV, PLT  Lab Results  Component Value Date   NA 136 06/02/2017   K 4.0 06/02/2017   CL 108 06/02/2017   CO2 22 06/02/2017   BUN 33 (H) 06/02/2017   CREATININE 1.68 (H) 06/02/2017   GLUCOSE 120 (H) 06/02/2017   ALT 19 03/11/2017    Lab Results  Component Value Date   CHOL 106 03/11/2017   HDL 28 (L) 03/11/2017   LDLCALC 39 03/11/2017   TRIG 194 (H) 03/11/2017   CHOLHDL 3.8 03/11/2017   --------------------------------------------------------------------------------------------------  ASSESSMENT AND PLAN: Aortic regurgitation No symptoms of heart failure. Echo in January was notable for mild to moderate aortic regurgitation, though other outside studies have suggested more significant AI. I agree with Dr. Roxy Manns that further characterization with TEE may be helpful. We will make arrangements for this to be done in [facility] at the patient's earliest convenience.  Thoracic aortic aneurysm Rodney Wiley remains asymptomatic. His blood pressure is  better controlled today. We will continue with blood pressure control and surveillance, as recommended by Dr. Roxy Manns. We will also continue with low-dose aspirin and atorvastatin.  Hypertension Blood pressure is reasonably well controlled today. Home readings have also been better since our last visit. We will continue our current medication regimen of amlodipine, carvedilol, enalapril, and doxazosin.  Renal artery stenosis Continue medical therapy. We will discuss repeating renal artery Doppler at our next visit to evaluate for progression of disease.  Follow-up: Return to clinic in 3 months.  Nelva Bush,  MD 09/20/2017 1:48 PM

## 2017-09-27 NOTE — Discharge Instructions (Signed)

## 2017-09-27 NOTE — Progress Notes (Signed)
  Echocardiogram Echocardiogram Transesophageal has been performed.  Rodney Wiley 09/27/2017, 9:02 AM

## 2017-09-27 NOTE — CV Procedure (Signed)
    Transesophageal Echocardiogram Note  Rodney Wiley [id number] 07-04-1936  Procedure: Transesophageal Echocardiogram Indications: Aortic regurgitation, ascending aortic aneurysm  Procedure Details Consent: Obtained Time Out: Verified patient identification, verified procedure, site/side was marked, verified correct patient position, special equipment/implants available, Radiology Safety Procedures followed,  medications/allergies/relevent history reviewed, required imaging and test results available.  Performed  Medications: During this procedure the patient is administered a total of Versed 4 mg and Fentanyl 50 mcg to achieve and maintain moderate conscious sedation.  The patient's heart rate, blood pressure, and oxygen saturation are monitored continuously during the procedure. The period of conscious sedation is 30 minutes, of which I was present face-to-face 100% of this time.  See TEE report for full dictation.  Complications: No apparent complications Patient did tolerate procedure well.  Ena Dawley, MD, [facility] 09/27/2017, 8:57 AM

## 2017-09-30 ENCOUNTER — Telehealth: Payer: Self-pay | Admitting: Internal Medicine

## 2017-09-30 NOTE — Telephone Encounter (Signed)
Recent TEE reviewed with Dr. Meda Coffee and Mr. Deady. Study showed high moderate aortic regurgitation. We will f/u as planned and defer further monitoring of ascending aortic aneurysm to Dr. Roxy Manns (likely gated CTA in January for reevaluation).  Nelva Bush, MD [facility] Pager: [phone]

## 2017-10-06 DIAGNOSIS — R69 Illness, unspecified: Secondary | ICD-10-CM | POA: Diagnosis not present

## 2017-12-21 ENCOUNTER — Encounter: Payer: Self-pay | Admitting: Internal Medicine

## 2017-12-21 ENCOUNTER — Ambulatory Visit: Payer: Medicare HMO | Admitting: Internal Medicine

## 2017-12-21 VITALS — BP 130/70 | HR 50 | Ht 67.0 in | Wt 178.0 lb

## 2017-12-21 DIAGNOSIS — I712 Thoracic aortic aneurysm, without rupture, unspecified: Secondary | ICD-10-CM

## 2017-12-21 DIAGNOSIS — I351 Nonrheumatic aortic (valve) insufficiency: Secondary | ICD-10-CM | POA: Diagnosis not present

## 2017-12-21 DIAGNOSIS — I10 Essential (primary) hypertension: Secondary | ICD-10-CM | POA: Diagnosis not present

## 2017-12-21 DIAGNOSIS — I701 Atherosclerosis of renal artery: Secondary | ICD-10-CM | POA: Diagnosis not present

## 2017-12-21 DIAGNOSIS — I440 Atrioventricular block, first degree: Secondary | ICD-10-CM

## 2017-12-21 MED ORDER — CARVEDILOL 25 MG PO TABS: 12.5000 mg | ORAL_TABLET | Freq: Two times a day (BID) | ORAL | Status: DC

## 2017-12-21 NOTE — Patient Instructions (Signed)
Medication Instructions:  Your physician recommends that you continue on your current medications as directed. Please refer to the Current Medication list given to you today.   Labwork: Your physician recommends that you return for lab work in: [place]  (BM[facility]). - Please go to the [facility]. You will check in at the front desk to the right as you walk into the atrium. Valet Parking is offered if needed.    Testing/Procedures: none  Follow-Up: Your physician wants you to follow-up in: 6 MONTHS WITH DR END. You will receive a reminder letter in the mail two months in advance. If you don't receive a letter, please call our office to schedule the follow-up appointment.   If you need a refill on your cardiac medications before your next appointment, please call your pharmacy.

## 2017-12-21 NOTE — Progress Notes (Signed)
Follow-up Outpatient Visit Date: 12/21/2017  Primary Care Provider: Remi Haggard, [place] [address]  Chief Complaint: Follow-up aortic regurgitation and thoracic aortic aneurysm  HPI:  Rodney Wiley is a 82 y.o. year-old male with history of hypertension, aortic regurgitation, thoracic aortic aneurysm, and chronic kidney disease stage 3 with left renal artery stenosis, who presents for follow-up of aortic regurgitation and TAA. I last saw him in 09/2017, at which time he was doing well. He subsequently underwent TEE, which confirmed moderate (though almost severe) aortic regurgitation. He has not follow-up with Dr. Roxy Manns since then.  Today, Rodney Wiley reports feeling well, denying chest pain, shortness of breath, palpitations, and lightheadedness.  He notes occasional dependent edema with stable weight.  Home blood pressures are variable, ranging from 527-782 mmHg systolic.  He denies medication side effects.  --------------------------------------------------------------------------------------------------  Cardiovascular History & Procedures: Cardiovascular Problems:  Thoracic aortic aneurysm  Aortic regurgitation  Mitral regurgitation  Renal artery stenosis  Risk Factors:  Hypertension, hyperlipidemia, male gender, and age > 32  Cath/PCI:  None  CV Surgery:  None  EP Procedures and Devices:  None  Non-Invasive Evaluation(s):  TEE (09/27/17): Mildly dilated LV with LVEF 60-65%. Moderate aortic regurgitation. Mild to moderate mitral regurgitation. Mild right atrial enlargement. Moderate aortic dilation, with root measuring 5.0 cm and ascending aorta 4.7 cm  MRA chest (06/06/17): Stable ascending aortic aneurysm, measuring 5.0 cm atthe sinotubular junction and 4.8 cm at the level of the main pulmonary artery.  Renal artery Doppler (01/13/17): Aortic atherosclerosis without stenosis or aneurysm. Normal right renal artery. Mild to  moderate left renal artery stenosis (1-59%). Normal/symmetric kidney size.  MRA chest (12/27/16): Aneurysmal disease of the ascending aorta, measuring 4.3 cm at the sinus of Valsalva and 4.9 cm above the sinotubular junction.  TTE (12/14/16): Mildly dilated LV with mild LVH. Normal contraction with LVEF 55-60%. Grade 1 diastolic dysfunction. Mild to moderate aortic regurgitation. Dilated aorta, measuring up to 4.9 cm above the sinotubular junction. Mild to moderate mitral regurgitation. Mild left atrial enlargement. Normal RV size and function.  Transthoracic echo (09/2016, [place]): Full report not available. Reportedly showed LVH with LVEF 42% and diastolic dysfunction. MR and moderate to severe AI present with sclerotic aortic valve.  Carotid Doppler (10/201&): Full report not available. Reportedly showed 1-39% stenosis bilaterally.  Transthoracic echo (02/18/16, [facility]): Normal LV and RV size and function. LVEF >55%. Moderate AI; mild MR and TR.  Recent CV Pertinent Labs: Lab Results  Component Value Date   CHOL 106 03/11/2017   CHOL 186 01/13/2017   HDL 28 (L) 03/11/2017   HDL 30 (L) 01/13/2017   LDLCALC 39 03/11/2017   LDLCALC 95 01/13/2017   TRIG 194 (H) 03/11/2017   CHOLHDL 3.8 03/11/2017   INR 1.16 09/24/2017   K 4.0 09/24/2017   BUN 25 (H) 09/24/2017   BUN 27 03/22/2017   CREATININE 1.74 (H) 09/24/2017    Past medical and surgical history were reviewed and updated in EPIC.  Current Meds  Medication Sig  . amLODipine (NORVASC) 10 MG tablet Take 10 mg by mouth daily.  [name] Kitchen aspirin EC 81 MG tablet Take 1 tablet (81 mg total) by mouth daily.  . Cholecalciferol (VITAMIN D3) 2000 units capsule Take 2,000 Units by mouth daily.   [name] Kitchen doxazosin (CARDURA) 2 MG tablet Take 1 tablet (2 mg total) by mouth daily.  . enalapril (VASOTEC) 20 MG tablet Take 1 tablet (20 mg total) by mouth  2 (two) times daily.  . ferrous sulfate 325 (65 FE) MG EC tablet Take  325 mg by mouth daily with breakfast.  . Omega-3 Fatty Acids (FISH OIL) 1200 MG CAPS Take 1 capsule by mouth daily.    Allergies: Patient has no known allergies.  Social History   Socioeconomic History  . Marital status: Single    Spouse name: Not on file  . Number of children: Not on file  . Years of education: Not on file  . Highest education level: Not on file  Social Needs  . Financial resource strain: Not on file  . Food insecurity - worry: Not on file  . Food insecurity - inability: Not on file  . Transportation needs - medical: Not on file  . Transportation needs - non-medical: Not on file  Occupational History  . Not on file  Tobacco Use  . Smoking status: Never Smoker  . Smokeless tobacco: Never Used  Substance and Sexual Activity  . Alcohol use: No  . Drug use: No  . Sexual activity: Not on file  Other Topics Concern  . Not on file  Social History Narrative  . Not on file    Family History  Problem Relation Age of Onset  . Hypertension Mother   . Leukemia Father   . Heart attack Paternal Grandmother   . Stroke Paternal Grandfather   . Heart Problems Son 62  . Heart Problems Son 55    Review of Systems: A 12-system review of systems was performed and was negative except as noted in the HPI.  --------------------------------------------------------------------------------------------------  Physical Exam: BP 130/70 (BP Location: Left Arm, Patient Position: Sitting, Cuff Size: Normal)   Pulse (!) 50   Ht 5\' 7"  (1.702 m)   Wt 178 lb (80.7 kg)   BMI 27.88 kg/m   General:  Well-developed, well-nourished man, seated comfortably in the exam room. HEENT: No conjunctival pallor or scleral icterus. Moist mucous membranes.  OP clear. Neck: Supple without lymphadenopathy, thyromegaly, JVD, or HJR. No carotid bruit. Lungs: Normal work of breathing. Clear to auscultation bilaterally without wheezes or crackles. Heart: Bradycardic but regular with 2/6  holosystolic and 1/6 diastolic murmurs. No rubs or gallops. Non-displaced PMI. Abd: Bowel sounds present. Soft, NT/ND without hepatosplenomegaly Ext: 1+ pretibial edema. Radial, PT, and DP pulses are 2+ bilaterally. Skin: Warm and dry without rash.  EKG:  Sinus bradycardia (HR 50 bpm) with 1st degree AV block and inferior Q-waves.  Lab Results  Component Value Date   WBC 4.6 09/24/2017   HGB 12.0 (L) 09/24/2017   HCT 34.9 (L) 09/24/2017   MCV 94.6 09/24/2017   PLT 140 (L) 09/24/2017    Lab Results  Component Value Date   NA 136 09/24/2017   K 4.0 09/24/2017   CL 107 09/24/2017   CO2 22 09/24/2017   BUN 25 (H) 09/24/2017   CREATININE 1.74 (H) 09/24/2017   GLUCOSE 110 (H) 09/24/2017   ALT 19 03/11/2017    Lab Results  Component Value Date   CHOL 106 03/11/2017   HDL 28 (L) 03/11/2017   LDLCALC 39 03/11/2017   TRIG 194 (H) 03/11/2017   CHOLHDL 3.8 03/11/2017    --------------------------------------------------------------------------------------------------  ASSESSMENT AND PLAN: Aortic regurgitation No symptoms of heart failure, though mild edema noted on exam today. Certainly some degree of leg swelling could be related to amlodipine. Mr. Jarchow remains bradycardic and also has lengthening PR interval. We will decrease carvedilol to 12.5 mg BID to shorten diastole.  Thoracic  aortic aneurysm Stable at 5.0 cm. However, in the setting of moderate, bordering on severe aortic regurgitation, we may need to proceed with surgical intervention in the near future. I will reach out to Dr. Roxy Manns about repeat imaging with thoracic MRA versus CTA, given CKD. We will continue with blood pressure and lipid control.  Hypertension and renal artery stenosis Blood pressure upper normal today. We will cut back on carvedilol to 12.5 mg BID, as above. If Dr. Roxy Manns wishes to obtain a CTA to evaluate TAA, we will included the abdomen to reevaluate his RAS.  1st degree AV block PR interval has  lengthened considerable since 10/218. I will cut back on carvedilol to 12.5 mg BID. However, I worry that worsening AV conduction could be a manifestation of his aortic valve disease.  Follow-up: Return to clinic in 6 months.  Nelva Bush, MD 12/21/2017 3:05 PM

## 2017-12-22 ENCOUNTER — Other Ambulatory Visit
Admission: RE | Admit: 2017-12-22 | Discharge: 2017-12-22 | Disposition: A | Discharge status: Home / Self Care | Payer: Medicare HMO | Source: Ambulatory Visit | Attending: Internal Medicine | Admitting: Internal Medicine

## 2017-12-22 ENCOUNTER — Other Ambulatory Visit: Payer: Self-pay | Admitting: *Deleted

## 2017-12-22 DIAGNOSIS — I712 Thoracic aortic aneurysm, without rupture, unspecified: Secondary | ICD-10-CM

## 2017-12-22 DIAGNOSIS — E7800 Pure hypercholesterolemia, unspecified: Secondary | ICD-10-CM | POA: Diagnosis not present

## 2017-12-22 DIAGNOSIS — I10 Essential (primary) hypertension: Secondary | ICD-10-CM | POA: Diagnosis not present

## 2017-12-22 DIAGNOSIS — E1065 Type 1 diabetes mellitus with hyperglycemia: Secondary | ICD-10-CM | POA: Diagnosis not present

## 2017-12-22 DIAGNOSIS — R5383 Other fatigue: Secondary | ICD-10-CM | POA: Diagnosis not present

## 2017-12-22 DIAGNOSIS — N390 Urinary tract infection, site not specified: Secondary | ICD-10-CM | POA: Diagnosis not present

## 2017-12-22 DIAGNOSIS — E559 Vitamin D deficiency, unspecified: Secondary | ICD-10-CM | POA: Diagnosis not present

## 2017-12-22 DIAGNOSIS — Z118 Encounter for screening for other infectious and parasitic diseases: Secondary | ICD-10-CM | POA: Diagnosis not present

## 2017-12-22 DIAGNOSIS — Z0000 Encounter for general adult medical examination without abnormal findings: Secondary | ICD-10-CM | POA: Diagnosis not present

## 2017-12-22 DIAGNOSIS — I38 Endocarditis, valve unspecified: Secondary | ICD-10-CM | POA: Diagnosis not present

## 2017-12-22 DIAGNOSIS — N400 Enlarged prostate without lower urinary tract symptoms: Secondary | ICD-10-CM | POA: Diagnosis not present

## 2017-12-22 DIAGNOSIS — D649 Anemia, unspecified: Secondary | ICD-10-CM | POA: Diagnosis not present

## 2017-12-22 DIAGNOSIS — K219 Gastro-esophageal reflux disease without esophagitis: Secondary | ICD-10-CM | POA: Diagnosis not present

## 2017-12-22 LAB — BASIC METABOLIC PANEL
Anion gap: 9 (ref 5–15)
BUN: 35 mg/dL — ABNORMAL HIGH (ref 6–20)
CO2: 22 mmol/L (ref 22–32)
Calcium: 9.1 mg/dL (ref 8.9–10.3)
Chloride: 106 mmol/L (ref 101–111)
Creatinine, Ser: 1.87 mg/dL — ABNORMAL HIGH (ref 0.61–1.24)
GFR calc Af Amer: 38 mL/min — ABNORMAL LOW (ref >60)
GFR calc non Af Amer: 33 mL/min — ABNORMAL LOW (ref >60)
Glucose, Bld: 105 mg/dL — ABNORMAL HIGH (ref 65–99)
Potassium: 4.1 mmol/L (ref 3.5–5.1)
Sodium: 137 mmol/L (ref 135–145)

## 2017-12-23 NOTE — Addendum Note (Signed)
Addended by: Anselm Pancoast on: 12/23/2017 09:35 AM   Modules accepted: Orders

## 2017-12-26 ENCOUNTER — Telehealth: Payer: Self-pay | Admitting: Internal Medicine

## 2017-12-26 NOTE — Telephone Encounter (Signed)
I spoke with [name] regarding results of his BMP and my correspondence with Dr. Roxy Manns, though symptoms are stable, [name] has noted some leg swelling. He does not wish to delay intervention on his TAA and aortic valve too long. In light of aortic root of at least 5.0 cm and borderline severe aortic regurgitation, we have agreed to have [name] follow-up with Dr. Roxy Manns in the office in the next few weeks in anticipation of proceeding with surgical intervention.  Nelva Bush, MD [facility] Pager: [phone]

## 2017-12-27 DIAGNOSIS — M4687 Other specified inflammatory spondylopathies, lumbosacral region: Secondary | ICD-10-CM | POA: Diagnosis not present

## 2017-12-27 DIAGNOSIS — M4316 Spondylolisthesis, lumbar region: Secondary | ICD-10-CM | POA: Diagnosis not present

## 2017-12-27 DIAGNOSIS — M5126 Other intervertebral disc displacement, lumbar region: Secondary | ICD-10-CM | POA: Diagnosis not present

## 2017-12-27 DIAGNOSIS — M4827 Kissing spine, lumbosacral region: Secondary | ICD-10-CM | POA: Diagnosis not present

## 2017-12-27 DIAGNOSIS — M47816 Spondylosis without myelopathy or radiculopathy, lumbar region: Secondary | ICD-10-CM | POA: Diagnosis not present

## 2017-12-27 DIAGNOSIS — M1612 Unilateral primary osteoarthritis, left hip: Secondary | ICD-10-CM | POA: Diagnosis not present

## 2017-12-27 NOTE — Telephone Encounter (Signed)
Patient has appointment on 01/11/18 with Dr Roxy Manns.

## 2018-01-03 DIAGNOSIS — I38 Endocarditis, valve unspecified: Secondary | ICD-10-CM | POA: Diagnosis not present

## 2018-01-03 DIAGNOSIS — R972 Elevated prostate specific antigen [PSA]: Secondary | ICD-10-CM | POA: Diagnosis not present

## 2018-01-03 DIAGNOSIS — D696 Thrombocytopenia, unspecified: Secondary | ICD-10-CM | POA: Diagnosis not present

## 2018-01-03 DIAGNOSIS — D649 Anemia, unspecified: Secondary | ICD-10-CM | POA: Diagnosis not present

## 2018-01-04 DIAGNOSIS — G608 Other hereditary and idiopathic neuropathies: Secondary | ICD-10-CM | POA: Diagnosis not present

## 2018-01-04 DIAGNOSIS — G603 Idiopathic progressive neuropathy: Secondary | ICD-10-CM | POA: Diagnosis not present

## 2018-01-04 DIAGNOSIS — M79661 Pain in right lower leg: Secondary | ICD-10-CM | POA: Diagnosis not present

## 2018-01-05 IMAGING — MR MR MRA CHEST W/ OR W/O CM
8 series · 16 of 16 positions shown · IV contrast (agent unspecified)
Comparison: MR 12/27/2016

CLINICAL DATA: 80-year-old male with a history of thoracic aortic
aneurysm.

EXAM:
MRA CHEST WITH OR WITHOUT CONTRAST
TECHNIQUE: Angiographic images of the chest were obtained using MRA technique
without intravenous contrast.
CONTRAST:  None

[Series 3: bSSFP · sagittal · 8.0mm · 1.37mm/px · 1 of 15 slices shown (1 of 4)]
[im 1/15]
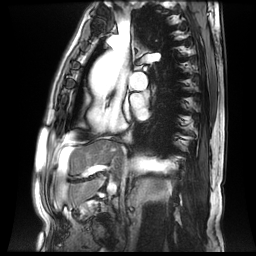

[Series 4: T1 · axial · 8.0mm · 0.74mm/px · 1 of 22 slices shown]
[im 1/22]
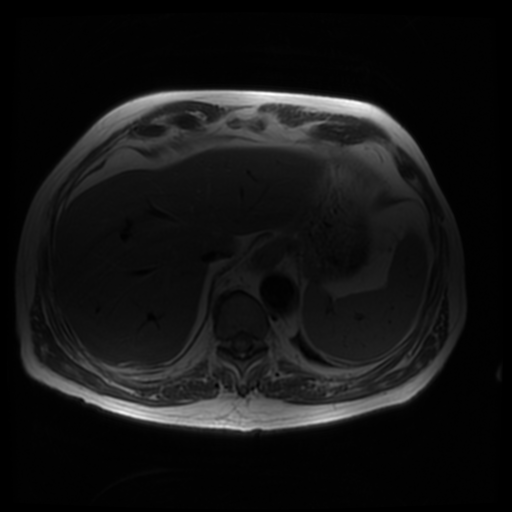

[Series 5: bSSFP · sagittal · 8.0mm · 1.41mm/px · 3 of 180 slices shown (2 of 4)]
[im 1/180]
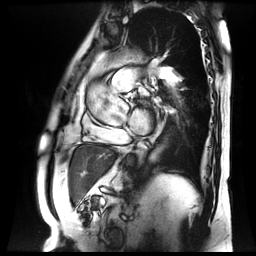
[im 90/180]
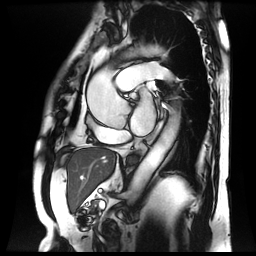
[im 180/180]
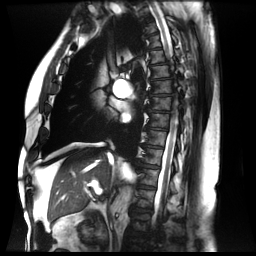

[Series 11: T1 dynamic · axial · non-contrast · 5.0mm · 0.78mm/px · z∈[-104,+194]mm · 3 of 120 slices shown (1 of 2)]
[im 1/120]
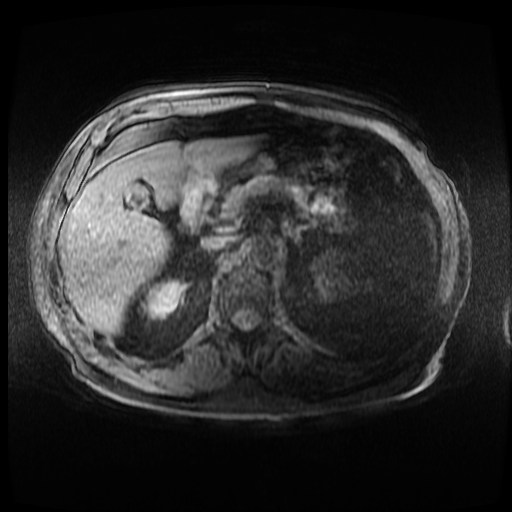
[im 60/120]
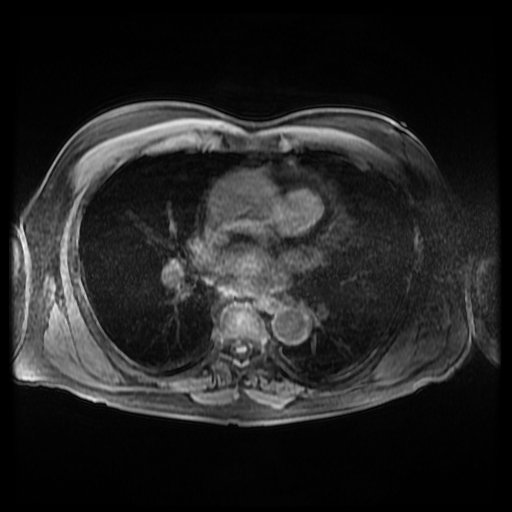
[im 120/120]
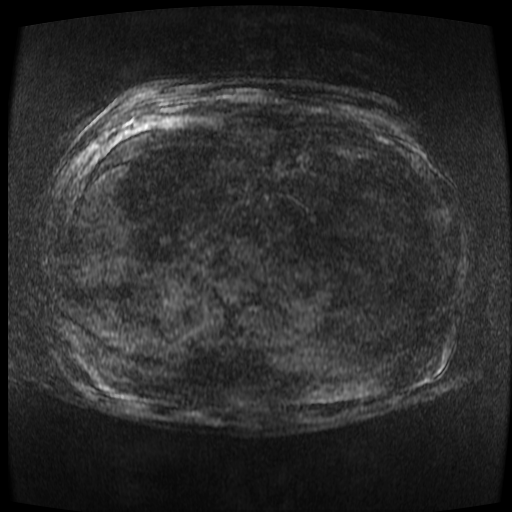

[Series 12: T1 dynamic · coronal · non-contrast · 5.0mm · 0.86mm/px · 2 of 80 slices shown (2 of 2)]
[im 1/80]
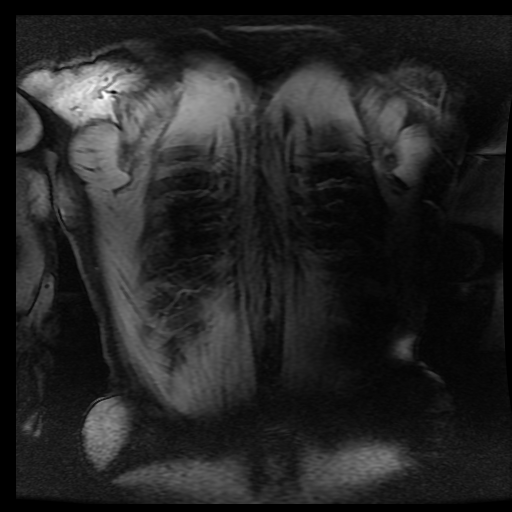
[im 80/80]
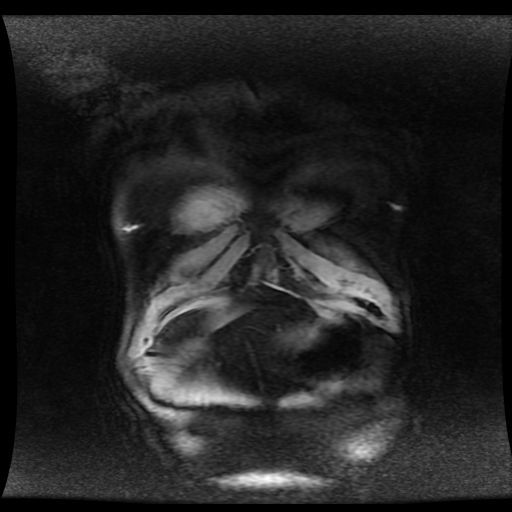

[Series 13: bSSFP · coronal · 8.0mm · 1.37mm/px · 1 of 11 slices shown (3 of 4)]
[im 1/11]
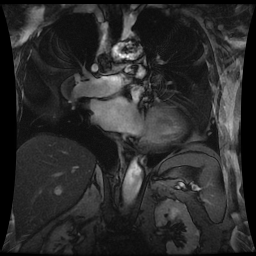

[Series 14: bSSFP · sagittal · 6.0mm · 1.09mm/px · 4 of 180 slices shown (4 of 4)]
[im 1/180]
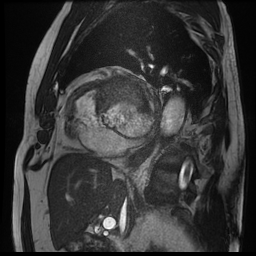
[im 60/180]
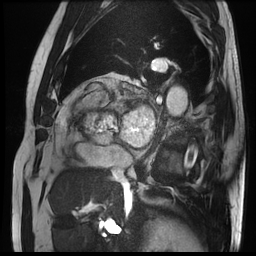
[im 120/180]
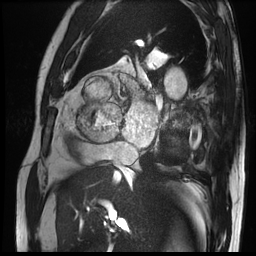
[im 180/180]
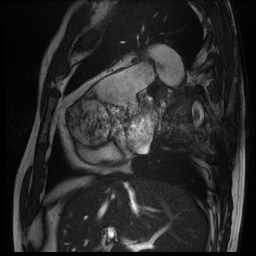

[Series 1500: MRA · sagittal · 3.0mm · 0.66mm/px · 1 of 52 slices shown]
[im 1/52]
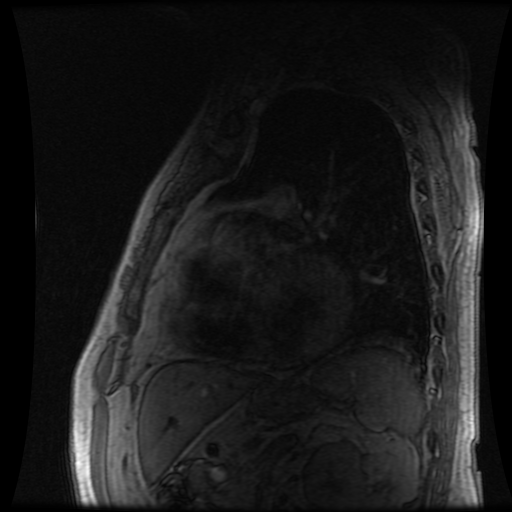

[16 of 16 positions shown; findings below may reference images not displayed]

FINDINGS: VASCULAR

Aorta: Thoracic aortic aneurysm again demonstrated, with MR
performed today without gadolinium contrast given chronic renal
insufficiency.

The overall configuration and size of the thoracic abdominal aorta
appears unchanged since the MR dated 12/27/2016. There are no 2
sequences performed on today's study on which a reproducible
diameter can be measured at the annulus, Auad junction, or
ascending aorta.

The annulus estimated approximately 3.8 cm

The Auad junction estimated 5.0 cm.

Greatest diameter of the ascending aorta measured at the level of
main pulmonary artery is approximately 4.8 cm. Proximal descending
thoracic aorta measures 3.0 cm.

Diameter at the aortic hiatus measures approximately 2.7 cm

Three vessel arch.

No periaortic fluid or dissection flap.

Unremarkable diameter of the main pulmonary artery.

Unchanged appearance of far with no pericardial fluid.

NON-VASCULAR

Unremarkable appearance of the musculoskeletal structures.

Unremarkable appearance of the lungs.

Unremarkable appearance of mediastinal structures with no
adenopathy.
IMPRESSION: Re- demonstration of ascending aortic aneurysm, which, within the
limits of the study, is relatively unchanged from the MR dated
12/27/2016. Although no 2 sequences performed on the current study
allow a reproducible diameter of the aortic root or the ascending
aorta, estimated diameter at the Auad junction is 5.0 cm and
at the level of the main pulmonary artery 4.8 cm.

## 2018-01-09 ENCOUNTER — Ambulatory Visit (HOSPITAL_COMMUNITY)
Admission: RE | Admit: 2018-01-09 | Discharge: 2018-01-09 | Disposition: A | Discharge status: Home / Self Care | Payer: Medicare HMO | Source: Ambulatory Visit | Attending: Thoracic Surgery (Cardiothoracic Vascular Surgery) | Admitting: Thoracic Surgery (Cardiothoracic Vascular Surgery)

## 2018-01-09 ENCOUNTER — Other Ambulatory Visit: Payer: Self-pay | Admitting: *Deleted

## 2018-01-09 DIAGNOSIS — I712 Thoracic aortic aneurysm, without rupture, unspecified: Secondary | ICD-10-CM

## 2018-01-09 DIAGNOSIS — N289 Disorder of kidney and ureter, unspecified: Secondary | ICD-10-CM

## 2018-01-11 ENCOUNTER — Encounter: Payer: Self-pay | Admitting: Thoracic Surgery (Cardiothoracic Vascular Surgery)

## 2018-01-11 ENCOUNTER — Ambulatory Visit: Payer: Medicare HMO | Admitting: Thoracic Surgery (Cardiothoracic Vascular Surgery)

## 2018-01-11 ENCOUNTER — Ambulatory Visit (HOSPITAL_COMMUNITY): Payer: Medicare HMO

## 2018-01-11 ENCOUNTER — Ambulatory Visit (HOSPITAL_COMMUNITY)
Admission: RE | Admit: 2018-01-11 | Discharge: 2018-01-11 | Disposition: A | Discharge status: Home / Self Care | Payer: Medicare HMO | Source: Ambulatory Visit | Attending: Thoracic Surgery (Cardiothoracic Vascular Surgery) | Admitting: Thoracic Surgery (Cardiothoracic Vascular Surgery)

## 2018-01-11 VITALS — BP 150/80 | HR 60 | Resp 20 | Ht 67.0 in | Wt 181.0 lb

## 2018-01-11 DIAGNOSIS — I712 Thoracic aortic aneurysm, without rupture, unspecified: Secondary | ICD-10-CM

## 2018-01-11 DIAGNOSIS — I351 Nonrheumatic aortic (valve) insufficiency: Secondary | ICD-10-CM | POA: Diagnosis not present

## 2018-01-11 DIAGNOSIS — I700 Atherosclerosis of aorta: Secondary | ICD-10-CM | POA: Insufficient documentation

## 2018-01-11 DIAGNOSIS — I2510 Atherosclerotic heart disease of native coronary artery without angina pectoris: Secondary | ICD-10-CM | POA: Diagnosis not present

## 2018-01-11 DIAGNOSIS — I340 Nonrheumatic mitral (valve) insufficiency: Secondary | ICD-10-CM | POA: Diagnosis not present

## 2018-01-11 DIAGNOSIS — N289 Disorder of kidney and ureter, unspecified: Secondary | ICD-10-CM

## 2018-01-11 LAB — BASIC METABOLIC PANEL
Anion gap: 9 (ref 5–15)
BUN: 22 mg/dL — ABNORMAL HIGH (ref 6–20)
CO2: 21 mmol/L — ABNORMAL LOW (ref 22–32)
Calcium: 9.3 mg/dL (ref 8.9–10.3)
Chloride: 108 mmol/L (ref 101–111)
Creatinine, Ser: 1.57 mg/dL — ABNORMAL HIGH (ref 0.61–1.24)
GFR calc Af Amer: 47 mL/min — ABNORMAL LOW (ref >60)
GFR calc non Af Amer: 40 mL/min — ABNORMAL LOW (ref >60)
Glucose, Bld: 75 mg/dL (ref 65–99)
Potassium: 4.5 mmol/L (ref 3.5–5.1)
Sodium: 138 mmol/L (ref 135–145)

## 2018-01-11 MED ORDER — NITROGLYCERIN 0.4 MG SL SUBL
SUBLINGUAL_TABLET | SUBLINGUAL | Status: AC
  Administered 2018-01-11: 0.8 mg
  Filled 2018-01-11: qty 2

## 2018-01-11 MED ORDER — SODIUM BICARBONATE 8.4 % IV SOLN
INTRAVENOUS | Status: DC
  Filled 2018-01-11: qty 500

## 2018-01-11 MED ORDER — SODIUM BICARBONATE BOLUS VIA INFUSION
INTRAVENOUS | Status: AC
  Administered 2018-01-11: 75 meq via INTRAVENOUS
  Filled 2018-01-11: qty 1

## 2018-01-11 MED ORDER — IOPAMIDOL (ISOVUE-370) INJECTION 76%
100.0000 mL | Freq: Once | INTRAVENOUS | Status: AC | PRN
  Administered 2018-01-11: 80 mL via INTRAVENOUS

## 2018-01-11 NOTE — Progress Notes (Addendum)
[address]       [place] [address]             [phone]     CARDIOTHORACIC SURGERY OFFICE NOTE  Referring Provider is End, Harrell Gave, MD PCP is Remi Haggard, FNP   HPI:  Patient is a 82 year old male with history of difficult to control hypertension and stage III chronic kidney disease who returns to the office today for follow-up of aortic insufficiency and ascending thoracic aortic aneurysm.  He was originally seen in consultation on September 05, 2017.  Previous echocardiograms revealed at least moderate if not severe aortic insufficiency with normal left ventricular systolic function.  MR angiograms of the thoracic aorta revealed annual aortic ectasia with fusiform aneurysmal enlargement of the aortic root and ascending thoracic aorta with maximum transverse diameter approaching 5 cm.  At that time the patient reported stable symptoms of mild exertional shortness of breath and chronic bilateral lower extremity edema consistent with chronic diastolic congestive heart failure, New York Heart Association functional class 1-2.  We discussed absolute indications for surgical intervention at that time and decided to follow the patient carefully.   Patient underwent transesophageal echocardiogram to better assess severity of aortic insufficiency on September 27, 2017.  Left ventricular function appeared normal with ejection fraction estimated 60-65%, although the left ventricle was "mildly dilated".  End-diastolic and end-systolic diameters of the left ventricle were not reported.  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  The aortic valve was trileaflet with incomplete coaptation and at least moderate if not severe aortic insufficiency.  The diastolic pressure halftime was reported 360 ms and vena contracta width 6.9 mm.  The patient was recently seen in follow-up by Dr. Saunders Revel and treatment options further  discussed regarding management of both the patient's aortic insufficiency and his aneurysmal aortic root.  He returns to our office today for follow-up.  He underwent cardiac gated CT angiogram of the heart and aorta earlier today for follow-up, although this test has not yet been formally read by a cardiologist.  The patient states that he essentially feels the same as he did last fall.  He states that he has slowed down over the last several years but he remains entirely functionally active.  He gets short of breath and tired more easily than he used to, but this does not seem to limit his physical activities to any significant degree.  He has not had any chest pain or chest tightness.  He has not had any PND, orthopnea, or lower extremity edema.  His blood pressure has remained somewhat challenging to control.  Current Outpatient Medications  Medication Sig Dispense Refill  . amLODipine (NORVASC) 10 MG tablet Take 10 mg by mouth daily.    [name] Kitchen aspirin EC 81 MG tablet Take 1 tablet (81 mg total) by mouth daily. 90 tablet 3  . atorvastatin (LIPITOR) 20 MG tablet Take 20 mg by mouth daily.    . carvedilol (COREG) 25 MG tablet Take 0.5 tablets (12.5 mg total) by mouth 2 (two) times daily.    . Cholecalciferol (VITAMIN D3) 2000 units capsule Take 2,000 Units by mouth daily.     [name] Kitchen doxazosin (CARDURA) 2 MG tablet Take 1 tablet (2 mg total) by mouth daily. 90 tablet 3  . enalapril (VASOTEC) 20 MG tablet Take 1 tablet (20 mg total) by mouth 2 (two) times daily. 180 tablet 1  .  ferrous sulfate 325 (65 FE) MG EC tablet Take 325 mg by mouth daily with breakfast.    . Omega-3 Fatty Acids (FISH OIL) 1200 MG CAPS Take 1 capsule by mouth daily.    . furosemide (LASIX) 40 MG tablet Take 1 tablet (40 mg total) by mouth daily. (Patient taking differently: Take 40 mg by mouth daily as needed. ) 90 tablet 3   No current facility-administered medications for this visit.    Facility-Administered Medications Ordered in  Other Visits  Medication Dose Route Frequency Provider Last Rate Last Dose  . sodium bicarbonate 75 mEq in dextrose 5 % 500 mL infusion   Intravenous to Phillips Grout, MD 85 mL/hr at 01/11/18 1300        Physical Exam:   BP (!) 150/80   Pulse 60   Resp 20   Ht 5\' 7"  (1.702 m)   Wt 181 lb (82.1 kg)   SpO2 95% Comment: RA  BMI 28.35 kg/m   General:  Well-appearing  Chest:   Clear to auscultation  CV:   Regular rate and rhythm with grade 2/6 soft systolic murmur and 2/6 diastolic murmur  Incisions:  n/a  Abdomen:  Soft nontender  Extremities:  Warm and well perfused, no edema  Diagnostic Tests:  Transesophageal Echocardiography  (Report amended )  Patient:    Rodney Wiley, Rodney Wiley MR #:       [id number] Study Date: 09/27/2017 Gender:     M Age:        32 Height:     170.2 cm Weight:     80.7 kg BSA:        1.97 m^2 Pt. Status: Room:   ORDERING     Nelva Bush, MD  REFERRING    Nelva Bush, MD  ADMITTING    Ena Dawley, M.D.  ATTENDING    Ena Dawley, M.D.  PERFORMING   Ena Dawley, M.D.  SONOGRAPHER  Roseanna Rainbow  cc:  ------------------------------------------------------------------- LV EF: 60% -   65%  ------------------------------------------------------------------- Indications:      Aortic insufficiency 424.1.  ------------------------------------------------------------------- History:   Risk factors:  Hypertension. Dyslipidemia.  ------------------------------------------------------------------- Study Conclusions  - Left ventricle: The cavity size was mildly dilated. Systolic   function was normal. The estimated ejection fraction was in the   range of 60% to 65%. Wall motion was normal; there were no   regional wall motion abnormalities. - Descending aorta: The descending aorta was normal in size. - Mitral valve: There was mild to moderate regurgitation. - Left atrium: No evidence of thrombus in the atrial cavity  or   appendage. No evidence of thrombus in the atrial cavity or   appendage. No evidence of thrombus in the appendage. - Right atrium: The atrium was dilated. No evidence of thrombus in   the atrial cavity or appendage.  Impressions:  - Aortic annulus is dilated. The maximum diameter at the sinuses   was 50 mm, at the sinotubular junction 47 mm, at the ascending   aorta 47 mm.   Aortic vale leaflets don&'t coapt. There is central jet of aortic   regurgitation at the upper level of moderate aortic   regurgitation. Vena contracta 6.9 mm. PHT 360 ms.  ------------------------------------------------------------------- Study data:   Study status:  Routine.  Consent:  The risks, benefits, and alternatives to the procedure were explained to the patient and informed consent was obtained.  Procedure:  Initial setup. The patient was brought to the laboratory. Surface ECG leads were  monitored. Sedation. Conscious sedation was administered by cardiology staff. Transesophageal echocardiography. An adult multiplane transesophageal probe was inserted by the attending cardiologist. Image quality was adequate.  Study completion:  The patient tolerated the procedure well. There were no complications. Administered medications:   Fentanyl, 33mcg, IV.  Midazolam, 5mg , IV.          Diagnostic transesophageal echocardiography.  2D and color Doppler.  Birthdate:  Patient birthdate: [birth year].  Age: Patient is 82 yr old.  Sex:  Gender: male.    BMI: 27.9 kg/m^2. Blood pressure:     171/75  Patient status:  Inpatient.  Study date:  Study date: 09/27/2017. Study time: 08:01 AM.  Location: Endoscopy.  -------------------------------------------------------------------  ------------------------------------------------------------------- Left ventricle:  The cavity size was mildly dilated. Systolic function was normal. The estimated ejection fraction was in the range of 60% to 65%. Wall motion was  normal; there were no regional wall motion abnormalities.  ------------------------------------------------------------------- Aortic valve:   Structurally normal valve. Trileaflet; normal thickness leaflets. Cusp separation was normal.  Doppler:  There was no significant regurgitation.  ------------------------------------------------------------------- Aorta:  There was severe non-mobile atheroma. There was no evidence for dissection. Descending aorta: The descending aorta was normal in size.  ------------------------------------------------------------------- Mitral valve:   Structurally normal valve.   Leaflet separation was normal.  Doppler:  There was mild to moderate regurgitation.  ------------------------------------------------------------------- Left atrium:  The atrium was normal in size.  No evidence of thrombus in the atrial cavity or appendage.  No evidence of thrombus in the atrial cavity or appendage.  No evidence of thrombus in the appendage. The appendage was morphologically a left appendage, multilobulated, and of normal size. Emptying velocity was normal.  ------------------------------------------------------------------- Right ventricle:  The cavity size was normal. Wall thickness was normal. Systolic function was normal.  ------------------------------------------------------------------- Pulmonic valve:    Structurally normal valve.  ------------------------------------------------------------------- Tricuspid valve:   Structurally normal valve.   Leaflet separation was normal.  Doppler:  There was mild regurgitation.  ------------------------------------------------------------------- Pulmonary artery:   The main pulmonary artery was normal-sized.  ------------------------------------------------------------------- Right atrium:  The atrium was dilated.  No evidence of thrombus in the atrial cavity or appendage. The appendage was  morphologically a right appendage.  ------------------------------------------------------------------- Pericardium:  There was no pericardial effusion.  ------------------------------------------------------------------- Measurements   LVOT                                  Value  LVOT ID, S                            18    mm  LVOT area                             2.54  cm^2    Aortic valve                          Value  Aortic regurg pressure half-time      374   ms  Legend: (L)  and  (H)  mark values outside specified reference range.  ------------------------------------------------------------------- Lance Morin, M.D. 2018-10-23T10:34:48   Impression:  Patient has long-standing hypertension with an ascending thoracic aortic aneurysm and probably severe aortic insufficiency. He describes stable symptoms of decreased energy with mild exertional shortness of breath and chronic bilateral  lower extremity edema consistent with chronic diastolic congestive heart failure, New York Heart Association functional class I-II.  I have personally reviewed the patient's TEE and the cardiac gated CT angiogram performed earlier today.  TEE reveals annuloaortic ectasia with trileaflet aortic valve and moderate - severe aortic insufficiency.  The left ventricle is somewhat dilated but left ventricular systolic function appears normal.  Cardiac gated CT angiogram of the heart has not yet been formally read by a cardiologist, but there is moderate annuloaortic ectasia with aneurysmal enlargement of the entire aortic root and ascending thoracic aorta.  At the level of the sinuses of Valsalva the transverse diameter approaches 5.0 cm.  It measures approximately 4.7-4.8 cm in the mid a sending aorta and tapers to approximately 3.5 cm immediately prior to takeoff of the innominate artery.  There is no aortic dissection.  Options include continued medical therapy with close follow-up  versus elective aortic valve repair with resection and grafting of the aneurysm involving the aortic root and ascending thoracic aorta.  Based upon findings of the patient's TEE I feel there is a high likelihood that the patient's native aortic valve can be repaired successfully and spared, although there is also a possibility that valve replacement would be necessary.  Risks associated with surgery will be slightly elevated because of the patient's age and chronic kidney disease.    Plan:  I discussed matters at length with the patient and his son in the office today.  Alternative treatment options were discussed.  The patient is interested in proceeding with surgery in the near future.  He will need to undergo coronary angiography to rule out the presence of significant coronary artery disease prior to her surgery.  We tentatively plan to proceed with surgery on February 22, 2018.  Patient will return to our office for follow-up on February 06, 2018.  All questions answered.  I spent in excess of 15 minutes during the conduct of this office consultation and >50% of this time involved direct face-to-face encounter with the patient for counseling and/or coordination of their care.    Valentina Gu. Roxy Manns, MD 01/11/2018 3:44 PM

## 2018-01-11 NOTE — Patient Instructions (Signed)
Continue all previous medications without any changes at this time  

## 2018-01-12 ENCOUNTER — Other Ambulatory Visit: Payer: Self-pay | Admitting: *Deleted

## 2018-01-12 ENCOUNTER — Other Ambulatory Visit
Admission: RE | Admit: 2018-01-12 | Discharge: 2018-01-12 | Disposition: A | Discharge status: Home / Self Care | Payer: Medicare HMO | Source: Ambulatory Visit | Attending: Internal Medicine | Admitting: Internal Medicine

## 2018-01-12 ENCOUNTER — Telehealth: Payer: Self-pay | Admitting: Internal Medicine

## 2018-01-12 DIAGNOSIS — I701 Atherosclerosis of renal artery: Secondary | ICD-10-CM | POA: Insufficient documentation

## 2018-01-12 DIAGNOSIS — N183 Chronic kidney disease, stage 3 unspecified: Secondary | ICD-10-CM

## 2018-01-12 DIAGNOSIS — I712 Thoracic aortic aneurysm, without rupture, unspecified: Secondary | ICD-10-CM

## 2018-01-12 DIAGNOSIS — I10 Essential (primary) hypertension: Secondary | ICD-10-CM | POA: Diagnosis not present

## 2018-01-12 DIAGNOSIS — I351 Nonrheumatic aortic (valve) insufficiency: Secondary | ICD-10-CM | POA: Diagnosis not present

## 2018-01-12 DIAGNOSIS — Z01810 Encounter for preprocedural cardiovascular examination: Secondary | ICD-10-CM | POA: Diagnosis not present

## 2018-01-12 DIAGNOSIS — I7121 Aneurysm of the ascending aorta, without rupture: Secondary | ICD-10-CM

## 2018-01-12 LAB — CBC WITH DIFFERENTIAL/PLATELET
Basophils Absolute: 0 10*3/uL (ref 0–0.1)
Basophils Relative: 1 %
Eosinophils Absolute: 0.2 10*3/uL (ref 0–0.7)
Eosinophils Relative: 4 %
HCT: 34.3 % — ABNORMAL LOW (ref 40.0–52.0)
Hemoglobin: 11.6 g/dL — ABNORMAL LOW (ref 13.0–18.0)
Lymphocytes Relative: 22 %
Lymphs Abs: 1.1 10*3/uL (ref 1.0–3.6)
MCH: 32 pg (ref 26.0–34.0)
MCHC: 33.8 g/dL (ref 32.0–36.0)
MCV: 94.5 fL (ref 80.0–100.0)
Monocytes Absolute: 0.5 10*3/uL (ref 0.2–1.0)
Monocytes Relative: 10 %
Neutro Abs: 3.3 10*3/uL (ref 1.4–6.5)
Neutrophils Relative %: 65 %
Platelets: 138 10*3/uL — ABNORMAL LOW (ref 150–440)
RBC: 3.62 MIL/uL — ABNORMAL LOW (ref 4.40–5.90)
RDW: 13.1 % (ref 11.5–14.5)
WBC: 5.1 10*3/uL (ref 3.8–10.6)

## 2018-01-12 LAB — PROTIME-INR
INR: 1.1
Prothrombin Time: 14.1 seconds (ref 11.4–15.2)

## 2018-01-12 NOTE — Addendum Note (Signed)
Addended by: Vanessa Ralphs on: 01/12/2018 01:31 PM   Modules accepted: Orders

## 2018-01-12 NOTE — Telephone Encounter (Signed)
Called cath lab and got patient schedule for January 16, 2018 at 12 noon, arrival of 0530 to short stay for prehydration.  S/w patient. He is going to check with his family to make sure this day will work and give me a call back.

## 2018-01-12 NOTE — Telephone Encounter (Signed)
I spoke with Rodney Wiley regarding recommendation for catheterization by Dr. Roxy Manns in anticipation of aortic root and aortic valve repair next month.  I discussed left and right heart catheterization with Rodney Wiley as well as possible renal angiography, given history of at least moderate renal artery stenosis noted last year on renal artery Doppler. I have reviewed the risks, indications, and alternatives to cardiac catheterization, possible angioplasty, and stenting with the patient. Risks include but are not limited to bleeding, infection, vascular injury, stroke, myocardial infection, arrhythmia, kidney injury, radiation-related injury in the case of prolonged fluoroscopy use, emergency cardiac surgery, and death. The patient understands the risks of serious complication is 1-2 in 0277 with diagnostic cardiac cath and 1-2% or less with angioplasty/stenting.  Rodney Wiley is agreeable to proceeding.  We will schedule this to be done at his convenience sometime this month.  Due to his baseline chronic kidney disease, I will have him come in at least 6 hours early for prehydration prior to the procedure.  Nelva Bush, MD [facility] Pager: [phone]

## 2018-01-12 NOTE — Telephone Encounter (Signed)
Patient called back. He is able to do 01/16/18. He verbalized understanding of the following pre-procedural instructions. He will go to the [facility] today or first thing tomorrow for the lab work.   You are scheduled for a Left and right Cardiac Catheterization  And Renal angiogram on Monday, February 11 with Dr. Harrell Gave End.  1. Please arrive at the [facility] (Main Entrance A) at [facility]: [place], [place] [address] at 5:30 AM (two hours before your procedure to ensure your preparation). Free valet parking service is available.   Special note: Every effort is made to have your procedure done on time. Please understand that emergencies sometimes delay scheduled procedures.  2. Diet: Do not eat or drink anything after midnight prior to your procedure except sips of water to take medications.  3. Labs: Today or tomorrow.  4. Medication instructions in preparation for your procedure:  On the morning of your procedure, take your Aspirin and any morning medicines NOT listed above.  You may use sips of water.  5. Plan for one night stay--bring personal belongings. 6. Bring a current list of your medications and current insurance cards. 7. You MUST have a responsible person to drive you home. 8. Someone MUST be with you the first 24 hours after you arrive home or your discharge will be delayed. 9. Please wear clothes that are easy to get on and off and wear slip-on shoes.  Thank you for allowing [place] to care for you!   -- [facility]

## 2018-01-16 ENCOUNTER — Ambulatory Visit (HOSPITAL_COMMUNITY)
Admission: RE | Disposition: A | Discharge status: Home / Self Care | Payer: Self-pay | Source: Ambulatory Visit | Attending: Internal Medicine

## 2018-01-16 ENCOUNTER — Ambulatory Visit (HOSPITAL_COMMUNITY)
Admission: RE | Admit: 2018-01-16 | Discharge: 2018-01-16 | Disposition: A | Discharge status: Home / Self Care | Payer: Medicare HMO | Source: Ambulatory Visit | Attending: Internal Medicine | Admitting: Internal Medicine

## 2018-01-16 DIAGNOSIS — K219 Gastro-esophageal reflux disease without esophagitis: Secondary | ICD-10-CM | POA: Insufficient documentation

## 2018-01-16 DIAGNOSIS — Z7982 Long term (current) use of aspirin: Secondary | ICD-10-CM | POA: Diagnosis not present

## 2018-01-16 DIAGNOSIS — I712 Thoracic aortic aneurysm, without rupture, unspecified: Secondary | ICD-10-CM | POA: Diagnosis present

## 2018-01-16 DIAGNOSIS — I2584 Coronary atherosclerosis due to calcified coronary lesion: Secondary | ICD-10-CM | POA: Diagnosis not present

## 2018-01-16 DIAGNOSIS — N183 Chronic kidney disease, stage 3 (moderate): Secondary | ICD-10-CM | POA: Diagnosis not present

## 2018-01-16 DIAGNOSIS — I2582 Chronic total occlusion of coronary artery: Secondary | ICD-10-CM | POA: Diagnosis not present

## 2018-01-16 DIAGNOSIS — I351 Nonrheumatic aortic (valve) insufficiency: Secondary | ICD-10-CM | POA: Diagnosis present

## 2018-01-16 DIAGNOSIS — I701 Atherosclerosis of renal artery: Secondary | ICD-10-CM | POA: Diagnosis present

## 2018-01-16 DIAGNOSIS — I2510 Atherosclerotic heart disease of native coronary artery without angina pectoris: Secondary | ICD-10-CM | POA: Insufficient documentation

## 2018-01-16 DIAGNOSIS — I129 Hypertensive chronic kidney disease with stage 1 through stage 4 chronic kidney disease, or unspecified chronic kidney disease: Secondary | ICD-10-CM | POA: Diagnosis not present

## 2018-01-16 DIAGNOSIS — Z8249 Family history of ischemic heart disease and other diseases of the circulatory system: Secondary | ICD-10-CM | POA: Insufficient documentation

## 2018-01-16 HISTORY — PX: RIGHT HEART CATH: CATH118263

## 2018-01-16 HISTORY — PX: CORONARY/GRAFT ANGIOGRAPHY: CATH118237

## 2018-01-16 LAB — POCT I-STAT 3, VENOUS BLOOD GAS (G3P V)
Acid-base deficit: 3 mmol/L — ABNORMAL HIGH (ref 0.0–2.0)
Bicarbonate: 21.4 mmol/L (ref 20.0–28.0)
O2 Saturation: 64 %
TCO2: 23 mmol/L (ref 22–32)
pCO2, Ven: 37 mmHg — ABNORMAL LOW (ref 44.0–60.0)
pH, Ven: 7.371 (ref 7.250–7.430)
pO2, Ven: 34 mmHg (ref 32.0–45.0)

## 2018-01-16 LAB — BASIC METABOLIC PANEL
Anion gap: 11 (ref 5–15)
BUN: 27 mg/dL — ABNORMAL HIGH (ref 6–20)
CO2: 21 mmol/L — ABNORMAL LOW (ref 22–32)
Calcium: 9.1 mg/dL (ref 8.9–10.3)
Chloride: 106 mmol/L (ref 101–111)
Creatinine, Ser: 1.76 mg/dL — ABNORMAL HIGH (ref 0.61–1.24)
GFR calc Af Amer: 41 mL/min — ABNORMAL LOW (ref >60)
GFR calc non Af Amer: 35 mL/min — ABNORMAL LOW (ref >60)
Glucose, Bld: 95 mg/dL (ref 65–99)
Potassium: 4.3 mmol/L (ref 3.5–5.1)
Sodium: 138 mmol/L (ref 135–145)

## 2018-01-16 LAB — POCT I-STAT 3, ART BLOOD GAS (G3+)
Acid-base deficit: 4 mmol/L — ABNORMAL HIGH (ref 0.0–2.0)
Bicarbonate: 20.7 mmol/L (ref 20.0–28.0)
O2 Saturation: 96 %
TCO2: 22 mmol/L (ref 22–32)
pCO2 arterial: 34.5 mmHg (ref 32.0–48.0)
pH, Arterial: 7.386 (ref 7.350–7.450)
pO2, Arterial: 83 mmHg (ref 83.0–108.0)

## 2018-01-16 SURGERY — RIGHT HEART CATH

## 2018-01-16 MED ORDER — SODIUM CHLORIDE 0.9% FLUSH: 3.0000 mL | INTRAVENOUS | Status: DC | PRN

## 2018-01-16 MED ORDER — SODIUM CHLORIDE 0.9 % IV SOLN: INTRAVENOUS | Status: DC

## 2018-01-16 MED ORDER — IOPAMIDOL (ISOVUE-370) INJECTION 76%
INTRAVENOUS | Status: AC
  Filled 2018-01-16: qty 100

## 2018-01-16 MED ORDER — VERAPAMIL HCL 2.5 MG/ML IV SOLN
INTRAVENOUS | Status: AC
  Filled 2018-01-16: qty 2

## 2018-01-16 MED ORDER — VERAPAMIL HCL 2.5 MG/ML IV SOLN
INTRAVENOUS | Status: DC | PRN
  Administered 2018-01-16: 10 mL via INTRA_ARTERIAL

## 2018-01-16 MED ORDER — IOPAMIDOL (ISOVUE-370) INJECTION 76%
INTRAVENOUS | Status: DC | PRN
  Administered 2018-01-16: 35 mL via INTRA_ARTERIAL

## 2018-01-16 MED ORDER — ASPIRIN 81 MG PO CHEW: 81.0000 mg | CHEWABLE_TABLET | ORAL | Status: DC

## 2018-01-16 MED ORDER — HEPARIN SODIUM (PORCINE) 1000 UNIT/ML IJ SOLN
INTRAMUSCULAR | Status: AC
  Filled 2018-01-16: qty 1

## 2018-01-16 MED ORDER — HEPARIN (PORCINE) IN NACL 2-0.9 UNIT/ML-% IJ SOLN
INTRAMUSCULAR | Status: AC | PRN
  Administered 2018-01-16: 1000 mL

## 2018-01-16 MED ORDER — SODIUM CHLORIDE 0.9 % IV SOLN: 250.0000 mL | INTRAVENOUS | Status: DC | PRN

## 2018-01-16 MED ORDER — ACETAMINOPHEN 325 MG PO TABS
650.0000 mg | ORAL_TABLET | Freq: Once | ORAL | Status: AC
  Administered 2018-01-16: 650 mg via ORAL

## 2018-01-16 MED ORDER — SODIUM CHLORIDE 0.9 % IV SOLN
INTRAVENOUS | Status: AC
  Administered 2018-01-16 (×2): via INTRAVENOUS

## 2018-01-16 MED ORDER — MIDAZOLAM HCL 2 MG/2ML IJ SOLN
INTRAMUSCULAR | Status: DC | PRN
  Administered 2018-01-16: 1 mg via INTRAVENOUS

## 2018-01-16 MED ORDER — HEPARIN SODIUM (PORCINE) 1000 UNIT/ML IJ SOLN
INTRAMUSCULAR | Status: DC | PRN
  Administered 2018-01-16: 4000 [IU] via INTRAVENOUS

## 2018-01-16 MED ORDER — HEPARIN (PORCINE) IN NACL 2-0.9 UNIT/ML-% IJ SOLN
INTRAMUSCULAR | Status: AC
  Filled 2018-01-16: qty 1000

## 2018-01-16 MED ORDER — LIDOCAINE HCL 1 % IJ SOLN
INTRAMUSCULAR | Status: AC
  Filled 2018-01-16: qty 20

## 2018-01-16 MED ORDER — FENTANYL CITRATE (PF) 100 MCG/2ML IJ SOLN
INTRAMUSCULAR | Status: AC
  Filled 2018-01-16: qty 2

## 2018-01-16 MED ORDER — ACETAMINOPHEN 325 MG PO TABS
ORAL_TABLET | ORAL | Status: AC
  Filled 2018-01-16: qty 2

## 2018-01-16 MED ORDER — MIDAZOLAM HCL 2 MG/2ML IJ SOLN
INTRAMUSCULAR | Status: AC
  Filled 2018-01-16: qty 2

## 2018-01-16 MED ORDER — FENTANYL CITRATE (PF) 100 MCG/2ML IJ SOLN
INTRAMUSCULAR | Status: DC | PRN
  Administered 2018-01-16: 25 ug via INTRAVENOUS

## 2018-01-16 MED ORDER — SODIUM CHLORIDE 0.9% FLUSH: 3.0000 mL | Freq: Two times a day (BID) | INTRAVENOUS | Status: DC

## 2018-01-16 SURGICAL SUPPLY — 15 items
CATH BALLN WEDGE 5F 110CM (CATHETERS) ×1 IMPLANT
CATH INFINITI 5 FR AL2 (CATHETERS) ×1 IMPLANT
CATH INFINITI 5 FR MPA2 (CATHETERS) ×1 IMPLANT
CATH INFINITI 5FR MULTPACK ANG (CATHETERS) ×1 IMPLANT
DEVICE RAD COMP TR BAND LRG (VASCULAR PRODUCTS) ×1 IMPLANT
GLIDESHEATH SLEND SS 6F .021 (SHEATH) ×1 IMPLANT
GUIDEWIRE .025 260CM (WIRE) ×1 IMPLANT
GUIDEWIRE INQWIRE 1.5J.035X260 (WIRE) IMPLANT
INQWIRE 1.5J .035X260CM (WIRE) ×2
KIT PREMIUM HAND CONTROLLER (KITS) ×1 IMPLANT
KIT SINGLE USE MANIFOLD (KITS) ×1 IMPLANT
PACK CARDIAC CATHETERIZATION (CUSTOM PROCEDURE TRAY) ×2 IMPLANT
PROTECTION STATION PRESSURIZED (MISCELLANEOUS) ×2
SHEATH GLIDE SLENDER 4/5FR (SHEATH) ×1 IMPLANT
STATION PROTECTION PRESSURIZED (MISCELLANEOUS) IMPLANT

## 2018-01-16 NOTE — H&P (Signed)
Cardiac Catheterization History and Physical:   Patient ID: Rodney Wiley; MRN: [id number]; DOB: 10-Dec-1935   Admission date: 01/16/2018  Primary Care Provider: Remi Haggard, FNP Primary Cardiologist: Nelva Bush, MD Primary Electrophysiologist:  None  Chief Complaint:  Preop evaluation  Patient Profile:   Rodney Wiley is a 82 y.o. male with a history of hypertension, aortic regurgitation, thoracic aortic aneurysm, coronary artery disease by cardiac CTA, and chronic kidney disease stage 3with left renal artery stenosis, presenting for preoperative evaluation in anticipation of aortic valve and thoracic aortic aneurysm repair.  History of Present Illness:   Rodney Wiley reports intermittent leg swelling as well as occassional fatigue over the last several months. His blood pressure has remained somewhat labile despite being on 4 blood pressure agents. He was seen by Dr. Roxy Manns last week for reevaluation of his aortic and aortic valve disease. Given borderline severe AI and stage D symptoms, decision was made to proceed with aortic root and valve repair. Of note, coronary CTA showed multivessel CAD, in some areas > 70. Rodney Wiley had not had any chest pain.   Past Medical History:  Diagnosis Date  . Arthropathy   . Ascending aortic aneurysm ([place])    a. 12/2016 MRA: 4.3cm @ sinus of valsalva, 4.9cm above sinotubular jxn.  . Cardiac murmur   . Cardiomegaly   . CKD (chronic kidney disease), stage III ([place])   . Essential hypertension   . GERD (gastroesophageal reflux disease)   . Hemorrhoids   . Left renal artery stenosis ([facility])    a. 01/2017 RA u/s: moderate L RAS.  [name] Kitchen Lower extremity edema   . Nonrheumatic aortic (valve) insufficiency    a. 12/2016 Echo: mild to mod AI.  [name] Kitchen Nonrheumatic mitral (valve) insufficiency    a. 12/2016 Echo: mild to mod MR.  [name] Kitchen Nonrheumatic pulmonary valve insufficiency     Past Surgical History:  Procedure Laterality Date  . ELBOW SURGERY Left     . hemorhoidectomy    . SHOULDER SURGERY Right   . TEE WITHOUT CARDIOVERSION N/A 09/27/2017   Procedure: TRANSESOPHAGEAL ECHOCARDIOGRAM (TEE);  Surgeon: Dorothy Spark, MD;  [facility];  Service: Cardiovascular;  Laterality: N/A;     Medications Prior to Admission: Prior to Admission medications   Medication Sig Start Date Elwood Bazinet Date Taking? Authorizing Provider  amLODipine (NORVASC) 10 MG tablet Take 10 mg by mouth daily.   Yes [provider]  aspirin EC 81 MG tablet Take 1 tablet (81 mg total) by mouth daily. 01/26/17  Yes Amel Gianino, Harrell Gave, MD  atorvastatin (LIPITOR) 20 MG tablet Take 20 mg by mouth daily.   Yes [provider]  carvedilol (COREG) 25 MG tablet Take 0.5 tablets (12.5 mg total) by mouth 2 (two) times daily. Patient taking differently: Take 12.5-25 mg by mouth 2 (two) times daily. If blood pressure is over 170/150 take 25 mg if under take 12.5 12/21/17  Yes Lamona Eimer, Harrell Gave, MD  Cholecalciferol (VITAMIN D3) 2000 units capsule Take 2,000 Units by mouth daily.    Yes [provider]  doxazosin (CARDURA) 2 MG tablet Take 1 tablet (2 mg total) by mouth daily. 09/20/17  Yes Cruzito Standre, Harrell Gave, MD  enalapril (VASOTEC) 20 MG tablet Take 1 tablet (20 mg total) by mouth 2 (two) times daily. 09/20/17  Yes Rakeya Glab, Harrell Gave, MD  ferrous sulfate 325 (65 FE) MG EC tablet Take 325 mg by mouth daily with breakfast.   Yes [provider]  furosemide (LASIX) 40 MG  tablet Take 1 tablet (40 mg total) by mouth daily. Patient taking differently: Take 40 mg by mouth daily as needed for fluid or edema.  03/03/17 01/12/18 Yes Shivam Mestas, Harrell Gave, MD     Allergies:   No Known Allergies  Social History:   Social History   Socioeconomic History  . Marital status: Single    Spouse name: Not on file  . Number of children: Not on file  . Years of education: Not on file  . Highest education level: Not on file  Social Needs  . Financial resource strain: Not  on file  . Food insecurity - worry: Not on file  . Food insecurity - inability: Not on file  . Transportation needs - medical: Not on file  . Transportation needs - non-medical: Not on file  Occupational History  . Not on file  Tobacco Use  . Smoking status: Never Smoker  . Smokeless tobacco: Never Used  Substance and Sexual Activity  . Alcohol use: No  . Drug use: No  . Sexual activity: Not on file  Other Topics Concern  . Not on file  Social History Narrative  . Not on file    Family History:   The patient's family history includes Heart Problems (age of onset: 80) in his son and son; Heart attack in his paternal grandmother; Hypertension in his mother; Leukemia in his father; Stroke in his paternal grandfather.    ROS:  Please see the history of present illness. All other ROS reviewed and negative.     Physical Exam/Data:   Vitals:   01/16/18 0707  BP: (!) 146/64  Pulse: (!) 57  Temp: (!) 97.5 F (36.4 C)  TempSrc: Oral  SpO2: 96%  Weight: 180 lb (81.6 kg)  Height: 5\' 7"  (1.702 m)   No intake or output data in the 24 hours ending 01/16/18 1326 Filed Weights   01/16/18 0707  Weight: 180 lb (81.6 kg)   Body mass index is 28.19 kg/m.  General:  Well nourished, well developed, in no acute distress. He is accompanied by his son. HEENT: normal Lymph: no adenopathy Neck: no  JVD Endocrine:  No thryomegaly Vascular: No carotid bruits; FA pulses 2+ bilaterally without bruits  Cardiac:  normal S1, S2; RRR with 2/6 systolic and 1/6 diastolic murmurs. Lungs:  clear to auscultation bilaterally, no wheezing, rhonchi or rales  Abd: soft, nontender, no hepatomegaly  Ext: Trace pretibial edema Musculoskeletal:  No deformities, BUE and BLE strength normal and equal Skin: warm and dry  Neuro:  CNs 2-12 intact, no focal abnormalities noted Psych:  Normal affect    EKG:  The ECG that was done 12/21/17 was personally reviewed and demonstrates sinus bradycardia with 1st degree  AV block and inferior Q-waves.  Laboratory Data:  Chemistry Recent Labs  Lab 01/11/18 1104 01/16/18 0730  NA 138 138  K 4.5 4.3  CL 108 106  CO2 21* 21*  GLUCOSE 75 95  BUN 22* 27*  CREATININE 1.57* 1.76*  CALCIUM 9.3 9.1  GFRNONAA 40* 35*  GFRAA 47* 41*  ANIONGAP 9 11    No results for input(s): PROT, ALBUMIN, AST, ALT, ALKPHOS, BILITOT in the last 168 hours. Hematology Recent Labs  Lab 01/12/18 1413  WBC 5.1  RBC 3.62*  HGB 11.6*  HCT 34.3*  MCV 94.5  MCH 32.0  MCHC 33.8  RDW 13.1  PLT 138*   Cardiac EnzymesNo results for input(s): TROPONINI in the last 168 hours. No results for input(s): TROPIPOC  in the last 168 hours.  BNPNo results for input(s): BNP, PROBNP in the last 168 hours.  DDimer No results for input(s): DDIMER in the last 168 hours.  Radiology/Studies:  No results found.  Assessment and Plan:   Aortic valve and thoracic aorta disease Plan for surgical intervention by Dr. Roxy Manns next month. Left and right heart catheterization has been requested for operative planning. I have reviewed the risks, indications, and alternatives to cardiac catheterization, possible angioplasty, and stenting with the patient. Risks include but are not limited to bleeding, infection, vascular injury, stroke, myocardial infection, arrhythmia, kidney injury, radiation-related injury in the case of prolonged fluoroscopy use, emergency cardiac surgery, and death. The patient understands the risks of serious complication is 1-2 in 9574 with diagnostic cardiac cath and 1-2% or less with angioplasty/stenting. Risk of CIN is increased due to CKD, which has been discussed with the patient. He has received 6 hours of prehydration in anticipation of today's procedure.  Renal artery stenosis Renal artery Doppler last year showed at least moderate left renal artery stenosis. Renal function has been stable, albeit with continued labile blood pressure at times. We have discussed renal  angiography and will proceed with this if total contrast needed for cardiac catheterization allows.  Cath Lab Visit (complete for each Cath Lab visit)  Clinical Evaluation Leading to the Procedure:   ACS: No.  Non-ACS:    Anginal Classification: CCS II  Anti-ischemic medical therapy: Minimal Therapy (1 class of medications)  Non-Invasive Test Results: Intermediate to high-risk cardiac CT findings: cardiac mortality >3%/year  Prior CABG: No previous CABG  For questions or updates, please contact [facility] Please consult [email or web address] for contact info under Cardiology/STEMI.    Signed, Nelva Bush, MD  01/16/2018 1:26 PM

## 2018-01-16 NOTE — Brief Op Note (Signed)
BRIEF CARDIAC CATHETERIZATION NOTE  DATE: 01/16/2018 TIME: 2:55 PM  PATIENT:  Rodney Wiley  82 y.o. male  PRE-OPERATIVE DIAGNOSIS:  Renal art stenosis; Aortic valve disease  POST-OPERATIVE DIAGNOSIS:  Multivessel CAD  PROCEDURE:  Procedure(s): RIGHT/LEFT HEART CATH AND CORONARY ANGIOGRAPHY (N/A)  SURGEON:  Surgeon(s) and Role:    * Ambrielle Kington, Harrell Gave, MD - Primary  FINDINGS: 1. Significant 3-vessel coronary artery disease, including diffuse mid LAD disease of up to 70%, 50% proximal LCx stenosis, 80-90% proximal OM3 disease, and chronic total occlusion of proximal/mid RCA. 2. Mildly elevated left and right heart filling pressures. 3. Normal PA pressure. 4. Normal Fick CO/CI.  RECOMMENDATIONS: 1. Proceed with aorta/aortic valve intervention, per Dr. Roxy Manns. CABG at the time of surgery will need to be considered, given multivessel CAD. 2. Aggressive secondary prevention.  Nelva Bush, MD [facility] Pager: [phone]

## 2018-01-16 NOTE — Progress Notes (Signed)
Per Dr End may stop IV fluids and discharge at 1830

## 2018-01-16 NOTE — Discharge Instructions (Signed)

## 2018-01-17 ENCOUNTER — Telehealth: Payer: Self-pay | Admitting: *Deleted

## 2018-01-17 ENCOUNTER — Encounter (HOSPITAL_COMMUNITY): Payer: Self-pay | Admitting: Internal Medicine

## 2018-01-17 ENCOUNTER — Other Ambulatory Visit: Payer: Self-pay | Admitting: *Deleted

## 2018-01-17 DIAGNOSIS — I351 Nonrheumatic aortic (valve) insufficiency: Secondary | ICD-10-CM

## 2018-01-17 DIAGNOSIS — I712 Thoracic aortic aneurysm, without rupture: Secondary | ICD-10-CM

## 2018-01-17 DIAGNOSIS — I7121 Aneurysm of the ascending aorta, without rupture: Secondary | ICD-10-CM

## 2018-01-17 DIAGNOSIS — N183 Chronic kidney disease, stage 3 unspecified: Secondary | ICD-10-CM

## 2018-01-17 DIAGNOSIS — I10 Essential (primary) hypertension: Secondary | ICD-10-CM

## 2018-01-17 MED FILL — Heparin Sodium (Porcine) 2 Unit/ML in Sodium Chloride 0.9%: INTRAMUSCULAR | Qty: 1000 | Status: AC

## 2018-01-17 MED FILL — Lidocaine HCl Local Inj 1%: INTRAMUSCULAR | Qty: 20 | Status: AC

## 2018-01-17 NOTE — Telephone Encounter (Signed)
-----   Message from Nelva Bush, MD sent at 01/17/2018  9:20 AM EST ----- Regarding: Post-cath labs Hi Anderson Malta,  Can you have Mr. Paff come in for a BMP in on Friday or early next week to make sure that his kidney function is stable? Thanks.  Gerald Stabs

## 2018-01-17 NOTE — Telephone Encounter (Signed)
S/w patient and he verbalized understanding to go to the [facility] on Friday or Monday for the lab work. Order entered.

## 2018-01-23 ENCOUNTER — Other Ambulatory Visit
Admission: RE | Admit: 2018-01-23 | Discharge: 2018-01-23 | Disposition: A | Discharge status: Home / Self Care | Payer: Medicare HMO | Source: Ambulatory Visit | Attending: Internal Medicine | Admitting: Internal Medicine

## 2018-01-23 DIAGNOSIS — I10 Essential (primary) hypertension: Secondary | ICD-10-CM | POA: Insufficient documentation

## 2018-01-23 DIAGNOSIS — N183 Chronic kidney disease, stage 3 unspecified: Secondary | ICD-10-CM

## 2018-01-23 LAB — BASIC METABOLIC PANEL
Anion gap: 9 (ref 5–15)
BUN: 27 mg/dL — ABNORMAL HIGH (ref 6–20)
CO2: 19 mmol/L — ABNORMAL LOW (ref 22–32)
Calcium: 8.5 mg/dL — ABNORMAL LOW (ref 8.9–10.3)
Chloride: 110 mmol/L (ref 101–111)
Creatinine, Ser: 1.53 mg/dL — ABNORMAL HIGH (ref 0.61–1.24)
GFR calc Af Amer: 48 mL/min — ABNORMAL LOW (ref >60)
GFR calc non Af Amer: 42 mL/min — ABNORMAL LOW (ref >60)
Glucose, Bld: 145 mg/dL — ABNORMAL HIGH (ref 65–99)
Potassium: 4.2 mmol/L (ref 3.5–5.1)
Sodium: 138 mmol/L (ref 135–145)

## 2018-02-06 ENCOUNTER — Other Ambulatory Visit: Payer: Self-pay

## 2018-02-06 ENCOUNTER — Encounter: Payer: Self-pay | Admitting: Thoracic Surgery (Cardiothoracic Vascular Surgery)

## 2018-02-06 ENCOUNTER — Ambulatory Visit: Payer: Medicare HMO | Admitting: Thoracic Surgery (Cardiothoracic Vascular Surgery)

## 2018-02-06 VITALS — BP 133/68 | HR 59 | Resp 18 | Ht 67.0 in | Wt 180.2 lb

## 2018-02-06 DIAGNOSIS — I712 Thoracic aortic aneurysm, without rupture, unspecified: Secondary | ICD-10-CM

## 2018-02-06 DIAGNOSIS — I2510 Atherosclerotic heart disease of native coronary artery without angina pectoris: Secondary | ICD-10-CM | POA: Diagnosis not present

## 2018-02-06 DIAGNOSIS — I351 Nonrheumatic aortic (valve) insufficiency: Secondary | ICD-10-CM

## 2018-02-06 DIAGNOSIS — I25119 Atherosclerotic heart disease of native coronary artery with unspecified angina pectoris: Secondary | ICD-10-CM | POA: Insufficient documentation

## 2018-02-06 DIAGNOSIS — I25118 Atherosclerotic heart disease of native coronary artery with other forms of angina pectoris: Secondary | ICD-10-CM | POA: Insufficient documentation

## 2018-02-06 NOTE — Patient Instructions (Signed)
   Continue taking all current medications without change through the day before surgery.  Have nothing to eat or drink after midnight the night before surgery.  On the morning of surgery take only carvedilol and amlodipine with a sip of water.

## 2018-02-06 NOTE — Progress Notes (Signed)
[address]       [place] [address]             [phone]     CARDIOTHORACIC SURGERY OFFICE NOTE  Referring Provider is End, Harrell Gave, MD PCP is Remi Haggard, FNP   HPI:  Patient is a 82 year old male with history of aortic insufficiency and ascending thoracic aortic aneurysm who returns to the office today with tentative plans to proceed with aortic valve repair or replacement and resection and grafting of a descending thoracic aortic aneurysm on February 22, 2018.  He was originally seen in consultation on September 05, 2017.  Previous echocardiograms revealed at least moderate if not severe aortic insufficiency with normal left ventricular systolic function.  MR angiograms of the thoracic aorta revealed annual aortic ectasia with fusiform aneurysmal enlargement of the aortic root and ascending thoracic aorta with maximum transverse diameter approaching 5 cm.  At that time the patient reported stable symptoms of mild exertional shortness of breath and chronic bilateral lower extremity edema consistent with chronic diastolic congestive heart failure, New York Heart Association functional class 1-2.   He underwent transesophageal echocardiogram on September 27, 2017.  Left ventricular function appeared normal with ejection fraction estimated 60-65%, although the left ventricle was "mildly dilated".  End-diastolic and end-systolic diameters of the left ventricle were not reported.  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  The aortic valve was trileaflet with incomplete coaptation and at least moderate if not severe aortic insufficiency.  The diastolic pressure halftime was reported 360 ms and vena contracta width 6.9 mm.  He states that over the past 6 months he has developed worsening symptoms of exertional shortness of breath.  He denies any resting shortness of breath, PND, orthopnea, or lower extremity edema.   He gets tired more easily than he used to.  He has never had any chest pain or chest tightness either with activity or at rest.  I saw him on follow-up on January 11, 2018 and we made tentative plans to proceed with surgery later this month.  Since then he underwent diagnostic cardiac catheterization by Dr. Saunders Revel on January 16, 2018.  He was found to have severe three-vessel coronary artery disease with mildly elevated left and right heart filling pressures.  He returns to our office today with tentative plans to proceed with surgery on February 22, 2018.  The remainder of his review of systems is unrevealing.   Current Outpatient Medications  Medication Sig Dispense Refill  . amLODipine (NORVASC) 10 MG tablet Take 10 mg by mouth daily.    [name] Kitchen aspirin EC 81 MG tablet Take 1 tablet (81 mg total) by mouth daily. 90 tablet 3  . atorvastatin (LIPITOR) 20 MG tablet Take 20 mg by mouth daily.    . carvedilol (COREG) 25 MG tablet Take 0.5 tablets (12.5 mg total) by mouth 2 (two) times daily. (Patient taking differently: Take 12.5-25 mg by mouth 2 (two) times daily. If blood pressure is over 170/150 take 25 mg if under take 12.5)    . Cholecalciferol (VITAMIN D3) 2000 units capsule Take 2,000 Units by mouth daily.     [name] Kitchen doxazosin (CARDURA) 2 MG tablet Take 1 tablet (2 mg total) by mouth daily. 90 tablet 3  . enalapril (VASOTEC) 20 MG tablet Take 1 tablet (20 mg total) by mouth 2 (two) times daily. 180 tablet 1  . ferrous sulfate  325 (65 FE) MG EC tablet Take 325 mg by mouth daily with breakfast.    . furosemide (LASIX) 40 MG tablet Take 1 tablet (40 mg total) by mouth daily. (Patient taking differently: Take 40 mg by mouth daily as needed for fluid or edema. ) 90 tablet 3  . gabapentin (NEURONTIN) 100 MG capsule      No current facility-administered medications for this visit.       Physical Exam:   BP 133/68 (BP Location: Left Arm, Patient Position: Sitting, Cuff Size: Large)   Pulse (!) 59   Resp 18    Ht 5\' 7"  (1.702 m)   Wt 180 lb 3.2 oz (81.7 kg)   SpO2 97% Comment: RA  BMI 28.22 kg/m   General:  Well-appearing  Chest:   Clear to auscultation  CV:   Regular rate and rhythm with grade III/VI diastolic murmur  Incisions:  n/a  Abdomen:  Soft nontender  Extremities:  Warm and well perfused  Diagnostic Tests:  RIGHT HEART CATH  CORONARY/GRAFT ANGIOGRAPHY  Conclusion   Conclusions: 1. Significant 3-vessel coronary artery disease, as detailed below. 2. Mildly elevated left and right heart filling pressures. 3. Upper normal pulmonary artery pressure. 4. Normal Fick cardiac output/index. 5. Renal angiography not performed due to radiation/contrast needed for coronary angiography.  Recommendations: 1. Proceed with aorta/aortic valve intervention, per Dr. Roxy Manns. CABG at the time of surgery will need to be considered, given multivessel CAD. 2. Aggressive secondary prevention. 3. We will discuss further non-invasive follow-up of renal artery stenosis when Mr. Sweda returns for follow-up.  Nelva Bush, MD [facility] Pager: [phone]    Indications   Thoracic aortic aneurysm without rupture ([place]) [I71.2 (ICD-10-CM)]  Nonrheumatic aortic valve insufficiency [I35.1 (ICD-10-CM)]  Procedural Details/Technique   Technical Details Indication: 82 y.o. year-old man with history of hypertension, aortic regurgitation, thoracic aortic aneurysm, coronary artery disease by cardiac CTA, and chronic kidney disease stage 3 with left renal artery stenosis, presenting for preoperative evaluation in anticipation of aortic valve and thoracic aortic aneurysm repair.  GFR: 35 ml/min  Procedure: The risks, benefits, complications, treatment options, and expected outcomes were discussed with the patient. The patient and/or family concurred with the proposed plan, giving informed consent. The patient was brought to the cath lab after IV hydration was begun and oral premedication was  given. The patient was further sedated with Versed and Fentanyl. The right wrist was assessed with a modified Allens test which was normal. The right wrist and elbow were prepped and draped in a sterile fashion. 1% lidocaine was used for local anesthesia. A previously placed antecubital vein IV was exchanged for a 27F slender Glidesheath using modified Seldinger technique. Right heart catheterization was performed by advancing a 27F balloon-tipped catheter through the right heart chambers into the pulmonary capillary wedge position. Pressure measurements and oxygen saturations were obtained.  Using the modified Seldinger access technique, a 73F slender Glidesheath was placed in the right radial artery. 3 mg Verapamil was given through the sheath. Heparin 4,000 units were administered. Selective coronary angiography was performed using 27F JL4 and MPA2 catheters to engage the left and right coronary arteries, respectively. Left heart catheterization was attempted with multiple catheters (angled pigtail, JR4, and MPA2) without success.  At the end of the procedure, the radial artery sheath was removed and a TR band applied to achieve patent hemostasis. There were no immediate complications. The patient was taken to the recovery area in stable condition.  Contrast used: 35  mL Isovue Fluoroscopy time: 16.1 min Radiation dose: 303 mGy   Estimated blood loss <50 mL.  During this procedure the patient was administered the following to achieve and maintain moderate conscious sedation: Versed 1 mg, Fentanyl 25 mcg, while the patient's heart rate, blood pressure, and oxygen saturation were continuously monitored. The period of conscious sedation was 34 minutes, of which I was present face-to-face 100% of this time.  Complications   Complications documented before study signed (01/16/2018 5:13 PM EST)    No complications were associated with this study.  Documented by Nelva Bush, MD - 01/16/2018 5:07 PM  EST    Coronary Findings   Diagnostic  Dominance: Right  Left Anterior Descending  Ost LAD lesion 20% stenosed  Ost LAD lesion is 20% stenosed. The lesion is mildly calcified.  Prox LAD lesion 40% stenosed  Prox LAD lesion is 40% stenosed.  Prox LAD to Mid LAD lesion 70% stenosed  Prox LAD to Mid LAD lesion is 70% stenosed. The lesion is irregular.  First Diagonal Branch  Vessel is small in size.  First Septal Branch  Vessel is moderate in size.  Second Diagonal Branch  Vessel is small in size.  Left Circumflex  Prox Cx lesion 60% stenosed  Prox Cx lesion is 60% stenosed.  First Obtuse Marginal Branch  Vessel is small in size.  Second Obtuse Marginal Branch  Vessel is moderate in size.  2nd Mrg lesion 60% stenosed  2nd Mrg lesion is 60% stenosed.  Third Obtuse Marginal Branch  Vessel is moderate in size.  Ost 3rd Mrg to 3rd Mrg lesion 85% stenosed  Ost 3rd Mrg to 3rd Mrg lesion is 85% stenosed.  Right Coronary Artery  Vessel is moderate in size.  Prox RCA to Mid RCA lesion 100% stenosed  Prox RCA to Mid RCA lesion is 100% stenosed. The lesion is chronically occluded with left-to-right and right-to-right collateral flow.  Right Posterior Descending Artery  Collaterals  RPDA filled by collaterals from 2nd Sept.    Intervention   No interventions have been documented.  Right Heart   Right Heart Pressures RA (mean): 9 mmHg RV (S/EDP): 43/10 mmHg PA (S/D, mean): 37/15 (22) mmHg PCWP (mean): 18 mmHg  Ao sat: 96% PA sat: 64%  Fick CO: 5.1 L/min Fick CI: 2.6 L/min/m^2  Coronary Diagrams   Diagnostic Diagram       Implants     No implant documentation for this case.  MERGE Images   Show images for CARDIAC CATHETERIZATION   Link to Procedure Log   Procedure Log    Hemo Data    Most Recent Value  Fick Cardiac Output 5.08 L/min  Fick Cardiac Output Index 2.63 (L/min)/BSA  RA A Wave 10 mmHg  RA V Wave 8 mmHg  RA Mean 7 mmHg  RV Systolic Pressure 36  mmHg  RV Diastolic Pressure 1 mmHg  RV EDP 6 mmHg  PA Systolic Pressure 33 mmHg  PA Diastolic Pressure 0 mmHg  PA Mean 22 mmHg  PW A Wave 19 mmHg  PW V Wave 28 mmHg  PW Mean 17 mmHg  AO Systolic Pressure 656 mmHg  AO Diastolic Pressure 60 mmHg  AO Mean 85 mmHg  QP/QS 1  TPVR Index 8.35 HRUI  TSVR Index 32.26 HRUI  PVR SVR Ratio 0.06  TPVR/TSVR Ratio 0.26      Impression:  Patient has an ascending thoracic aortic aneurysmwith severe aortic insufficiency and severe three-vessel coronary artery disease. He describes progressive symptoms of  exertional shortness of breath, fatigue and chronic bilateral lower extremity edema consistent with chronic diastolic congestive heart failure, New York Heart Association functional class II.  He denies any symptoms of exertional chest pain or chest tightness.I have personally reviewed the patient's TEE, cardiac gated CT angiogram, and diagnostic cardiac catheterization.  TEE reveals annuloaortic ectasia with trileaflet aortic valve and moderate - severe aortic insufficiency.  The left ventricle is somewhat dilated but left ventricular systolic function appears normal.  Cardiac gated CT angiogram of the heart has not yet been formally read by a cardiologist, but there is moderate annuloaortic ectasia with aneurysmal enlargement of the entire aortic root and ascending thoracic aorta.  At the level of the sinuses of Valsalva the transverse diameter approaches 5.0 cm.  It measures approximately 4.7-4.8 cm in the mid a sending aorta and tapers to approximately 3.5 cm immediately prior to takeoff of the innominate artery.  There is no aortic dissection.  Catheterization revealed severe three-vessel coronary artery disease with long segment 70% stenosis of mid left anterior descending coronary artery, 60% stenosis of the left circumflex with 85% stenosis of the second obtuse marginal branch, and 100% chronic occlusion of the right coronary artery with  left-to-right collaterals filling small terminal branches.  Left ventricular end-diastolic pressure and pulmonary artery pressures were mildly elevated.     Plan:  I have again reviewed the indications, risks, and potential benefits of aortic valve repair or replacement and surgical repair of the patient's ascending thoracic aorta with the patient in the office today.  The significance of the patient's newly discovered coronary artery disease has been discussed, as well as need for surgical revascularization.  The natural history of aortic insufficiency and aortic aneurysm were reviewed, as was long term prognosis with medical therapy alone.  Surgical options were discussed at length including surgical repair of the aortic aneurysm with conventional surgical aortic valve replacement versus an attempt at aortic valve repair.  The patient understands and accepts all potential associated risks of surgery including but not limited to risk of death, stroke, myocardial infarction, congestive heart failure, respiratory failure, renal failure, pneumonia, bleeding requiring blood transfusion and or reexploration, arrhythmia, heart block or bradycardia requiring permanent pacemaker, aortic dissection or other major vascular complication, pleural effusions or other delayed complications related to continued congestive heart failure, and other late complications related to valve replacement including structural valve deterioration and failure, thrombosis, endocarditis, or paravalvular leak, late recurrence of severe aortic insufficiency following an attempt of valve repair, or late recurrence of symptomatic coronary artery disease following coronary artery bypass grafting.  Expectations for the patient's postoperative convalescence have been discussed in detail.  Given his living arrangements at home he understands that he will likely need short-term placement in a skilled nursing facility at the time of hospital  discharge, and he has already identified to facilities that he would prefer if possible.  All of his questions have been addressed.  We plan to proceed with surgery on February 22, 2018.   I spent in excess of 15 minutes during the conduct of this office consultation and >50% of this time involved direct face-to-face encounter with the patient for counseling and/or coordination of their care.    Valentina Gu. Roxy Manns, MD 02/06/2018 3:13 PM

## 2018-02-17 NOTE — Pre-Procedure Instructions (Signed)
Rodney Wiley  02/17/2018      [place], [place] - [address] [address] Phone: [phone] Fax: [place], [place] [address] [place] [address] Phone: [phone] Fax: [phone]    Your procedure is scheduled on Wednesday, March 20th   Report to [facility] Admitting at 6:30 AM             (posted surgery time 8:30a - 6:30p)   Call this number if you have problems the morning of surgery:  [phone]   Remember:               4-5 days prior to surgery, STOP TAKING any Vitamins, Herbal Supplements, Anti-inflammatories.   Do not eat food or drink liquids after midnight, Tuesday.   Take these medicines the morning of surgery with A SIP OF WATER: Amlodipine, Coreg.   Do not wear jewelry - no rings or watches.  Do not wear lotions, colognes or deodorant.             Men may shave face and neck.  Do not bring valuables to the hospital.  [facility] is not responsible for any belongings or valuables.  Contacts, dentures or bridgework may not be worn into surgery.  Leave your suitcase in the car.  After surgery it may be brought to your room.  For patients admitted to the hospital, discharge time will be determined by your treatment team.  Please read over the following fact sheets that you were given. Pain Booklet, MRSA Information and Surgical Site Infection Prevention       [facility]- Preparing For Surgery  Before surgery, you can play an important role. Because skin is not sterile, your skin needs to be as free of germs as possible. You can reduce the number of germs on your skin by washing with CHG (chlorahexidine gluconate) Soap before surgery.  CHG is an antiseptic cleaner which kills germs and bonds with the skin to continue killing germs even after washing.  Please do not use if you have an  allergy to CHG or antibacterial soaps. If your skin becomes reddened/irritated stop using the CHG.  Do not shave (including legs and underarms) for at least 48 hours prior to first CHG shower. It is OK to shave your face.  Please follow these instructions carefully.   1. Shower the NIGHT BEFORE SURGERY and the MORNING OF SURGERY with CHG.   2. If you chose to wash your hair, wash your hair first as usual with your normal shampoo.  3. After you shampoo, rinse your hair and body thoroughly to remove the shampoo.  4. Use CHG as you would any other liquid soap. You can apply CHG directly to the skin and wash gently with a scrungie or a clean washcloth.   5. Apply the CHG Soap to your body ONLY FROM THE NECK DOWN.  Do not use on open wounds or open sores. Avoid contact with your eyes, ears, mouth and genitals (private parts). Wash Face and genitals (private parts)  with your normal soap.  6. Wash thoroughly, paying special attention to the area where your surgery will be performed.  7. Thoroughly rinse your body with warm water from the neck down.  8. DO NOT shower/wash with your normal soap after using and rinsing off the CHG Soap.  9. Pat yourself  dry with a CLEAN TOWEL.  10. Wear CLEAN PAJAMAS to bed the night before surgery, wear comfortable clothes the morning of surgery  11. Place CLEAN SHEETS on your bed the night of your first shower and DO NOT SLEEP WITH PETS.    Day of Surgery: Do not apply any deodorants/lotions. Please wear clean clothes to the hospital/surgery center.

## 2018-02-20 ENCOUNTER — Other Ambulatory Visit: Payer: Self-pay

## 2018-02-20 ENCOUNTER — Encounter (HOSPITAL_COMMUNITY)
Admission: RE | Admit: 2018-02-20 | Discharge: 2018-02-20 | Disposition: A | Discharge status: Home / Self Care | Payer: Medicare HMO | Source: Ambulatory Visit | Attending: Thoracic Surgery (Cardiothoracic Vascular Surgery) | Admitting: Thoracic Surgery (Cardiothoracic Vascular Surgery)

## 2018-02-20 ENCOUNTER — Encounter (HOSPITAL_COMMUNITY): Payer: Self-pay

## 2018-02-20 ENCOUNTER — Other Ambulatory Visit: Payer: Self-pay | Admitting: *Deleted

## 2018-02-20 ENCOUNTER — Ambulatory Visit (HOSPITAL_COMMUNITY)
Admission: RE | Admit: 2018-02-20 | Discharge: 2018-02-20 | Disposition: A | Discharge status: Home / Self Care | Payer: Medicare HMO | Source: Ambulatory Visit | Attending: Thoracic Surgery (Cardiothoracic Vascular Surgery) | Admitting: Thoracic Surgery (Cardiothoracic Vascular Surgery)

## 2018-02-20 ENCOUNTER — Ambulatory Visit (HOSPITAL_BASED_OUTPATIENT_CLINIC_OR_DEPARTMENT_OTHER)
Admission: RE | Admit: 2018-02-20 | Discharge: 2018-02-20 | Disposition: A | Discharge status: Home / Self Care | Payer: Medicare HMO | Source: Ambulatory Visit | Attending: Thoracic Surgery (Cardiothoracic Vascular Surgery) | Admitting: Thoracic Surgery (Cardiothoracic Vascular Surgery)

## 2018-02-20 DIAGNOSIS — I6523 Occlusion and stenosis of bilateral carotid arteries: Secondary | ICD-10-CM

## 2018-02-20 DIAGNOSIS — I2510 Atherosclerotic heart disease of native coronary artery without angina pectoris: Secondary | ICD-10-CM | POA: Diagnosis not present

## 2018-02-20 DIAGNOSIS — I712 Thoracic aortic aneurysm, without rupture: Secondary | ICD-10-CM

## 2018-02-20 DIAGNOSIS — I351 Nonrheumatic aortic (valve) insufficiency: Secondary | ICD-10-CM

## 2018-02-20 DIAGNOSIS — D62 Acute posthemorrhagic anemia: Secondary | ICD-10-CM | POA: Diagnosis not present

## 2018-02-20 DIAGNOSIS — Z01810 Encounter for preprocedural cardiovascular examination: Secondary | ICD-10-CM | POA: Insufficient documentation

## 2018-02-20 DIAGNOSIS — N183 Chronic kidney disease, stage 3 (moderate): Secondary | ICD-10-CM | POA: Diagnosis not present

## 2018-02-20 DIAGNOSIS — N170 Acute kidney failure with tubular necrosis: Secondary | ICD-10-CM | POA: Diagnosis not present

## 2018-02-20 DIAGNOSIS — I7121 Aneurysm of the ascending aorta, without rupture: Secondary | ICD-10-CM

## 2018-02-20 DIAGNOSIS — Q2543 Congenital aneurysm of aorta: Secondary | ICD-10-CM | POA: Diagnosis not present

## 2018-02-20 DIAGNOSIS — I080 Rheumatic disorders of both mitral and aortic valves: Secondary | ICD-10-CM | POA: Diagnosis not present

## 2018-02-20 DIAGNOSIS — I130 Hypertensive heart and chronic kidney disease with heart failure and stage 1 through stage 4 chronic kidney disease, or unspecified chronic kidney disease: Secondary | ICD-10-CM | POA: Diagnosis not present

## 2018-02-20 DIAGNOSIS — J9811 Atelectasis: Secondary | ICD-10-CM | POA: Diagnosis not present

## 2018-02-20 DIAGNOSIS — Z01812 Encounter for preprocedural laboratory examination: Secondary | ICD-10-CM

## 2018-02-20 DIAGNOSIS — Z01818 Encounter for other preprocedural examination: Secondary | ICD-10-CM | POA: Insufficient documentation

## 2018-02-20 DIAGNOSIS — I5033 Acute on chronic diastolic (congestive) heart failure: Secondary | ICD-10-CM | POA: Diagnosis not present

## 2018-02-20 LAB — COMPREHENSIVE METABOLIC PANEL
ALT: 29 U/L (ref 17–63)
AST: 25 U/L (ref 15–41)
Albumin: 3.9 g/dL (ref 3.5–5.0)
Alkaline Phosphatase: 54 U/L (ref 38–126)
Anion gap: 10 (ref 5–15)
BUN: 29 mg/dL — ABNORMAL HIGH (ref 6–20)
CO2: 18 mmol/L — ABNORMAL LOW (ref 22–32)
Calcium: 9.2 mg/dL (ref 8.9–10.3)
Chloride: 112 mmol/L — ABNORMAL HIGH (ref 101–111)
Creatinine, Ser: 1.68 mg/dL — ABNORMAL HIGH (ref 0.61–1.24)
GFR calc Af Amer: 43 mL/min — ABNORMAL LOW (ref >60)
GFR calc non Af Amer: 37 mL/min — ABNORMAL LOW (ref >60)
Glucose, Bld: 98 mg/dL (ref 65–99)
Potassium: 4.9 mmol/L (ref 3.5–5.1)
Sodium: 140 mmol/L (ref 135–145)
Total Bilirubin: 1 mg/dL (ref 0.3–1.2)
Total Protein: 7.4 g/dL (ref 6.5–8.1)

## 2018-02-20 LAB — URINALYSIS, ROUTINE W REFLEX MICROSCOPIC
Bacteria, UA: NONE SEEN
Bilirubin Urine: NEGATIVE
Glucose, UA: NEGATIVE mg/dL
Hgb urine dipstick: NEGATIVE
Ketones, ur: NEGATIVE mg/dL
Leukocytes, UA: NEGATIVE
Nitrite: NEGATIVE
Protein, ur: 30 mg/dL — AB
Specific Gravity, Urine: 1.02 (ref 1.005–1.030)
Squamous Epithelial / LPF: NONE SEEN
pH: 5 (ref 5.0–8.0)

## 2018-02-20 LAB — BLOOD GAS, ARTERIAL
Acid-base deficit: 1.8 mmol/L (ref 0.0–2.0)
Bicarbonate: 22.2 mmol/L (ref 20.0–28.0)
Drawn by: 470591
FIO2: 21
O2 Saturation: 96.6 %
Patient temperature: 98.6
pCO2 arterial: 36.3 mmHg (ref 32.0–48.0)
pH, Arterial: 7.403 (ref 7.350–7.450)
pO2, Arterial: 96.2 mmHg (ref 83.0–108.0)

## 2018-02-20 LAB — PULMONARY FUNCTION TEST
DL/VA % pred: 79 %
DL/VA: 3.47 ml/min/mmHg/L
DLCO unc % pred: 55 %
DLCO unc: 15.8 ml/min/mmHg
FEF 25-75 Post: 3.15 L/sec
FEF 25-75 Pre: 3.09 L/sec
FEF2575-%Change-Post: 2 %
FEF2575-%Pred-Post: 183 %
FEF2575-%Pred-Pre: 179 %
FEV1-%Change-Post: 3 %
FEV1-%Pred-Post: 95 %
FEV1-%Pred-Pre: 92 %
FEV1-Post: 2.4 L
FEV1-Pre: 2.31 L
FEV1FVC-%Change-Post: 4 %
FEV1FVC-%Pred-Pre: 115 %
FEV6-%Change-Post: 0 %
FEV6-%Pred-Post: 84 %
FEV6-%Pred-Pre: 84 %
FEV6-Post: 2.77 L
FEV6-Pre: 2.78 L
FEV6FVC-%Pred-Post: 107 %
FEV6FVC-%Pred-Pre: 107 %
FVC-%Change-Post: 0 %
FVC-%Pred-Post: 77 %
FVC-%Pred-Pre: 78 %
FVC-Post: 2.77 L
FVC-Pre: 2.78 L
Post FEV1/FVC ratio: 87 %
Post FEV6/FVC ratio: 100 %
Pre FEV1/FVC ratio: 83 %
Pre FEV6/FVC Ratio: 100 %

## 2018-02-20 LAB — CBC
HCT: 35.8 % — ABNORMAL LOW (ref 39.0–52.0)
Hemoglobin: 12.2 g/dL — ABNORMAL LOW (ref 13.0–17.0)
MCH: 32.4 pg (ref 26.0–34.0)
MCHC: 34.1 g/dL (ref 30.0–36.0)
MCV: 95.2 fL (ref 78.0–100.0)
Platelets: 138 10*3/uL — ABNORMAL LOW (ref 150–400)
RBC: 3.76 MIL/uL — ABNORMAL LOW (ref 4.22–5.81)
RDW: 13.3 % (ref 11.5–15.5)
WBC: 5.9 10*3/uL (ref 4.0–10.5)

## 2018-02-20 LAB — SURGICAL PCR SCREEN
MRSA, PCR: NEGATIVE
Staphylococcus aureus: NEGATIVE

## 2018-02-20 LAB — APTT: aPTT: 30 seconds (ref 24–36)

## 2018-02-20 LAB — ABO/RH: ABO/RH(D): O NEG

## 2018-02-20 LAB — HEMOGLOBIN A1C
Hgb A1c MFr Bld: 5.2 % (ref 4.8–5.6)
Mean Plasma Glucose: 102.54 mg/dL

## 2018-02-20 LAB — PROTIME-INR
INR: 1.12
Prothrombin Time: 14.3 seconds (ref 11.4–15.2)

## 2018-02-20 MED ORDER — CARVEDILOL 25 MG PO TABS: 12.5000 mg | ORAL_TABLET | Freq: Two times a day (BID) | ORAL | 3 refills | Status: DC

## 2018-02-20 MED ORDER — ALBUTEROL SULFATE (2.5 MG/3ML) 0.083% IN NEBU
2.5000 mg | INHALATION_SOLUTION | Freq: Once | RESPIRATORY_TRACT | Status: AC
  Administered 2018-02-20: 2.5 mg via RESPIRATORY_TRACT

## 2018-02-20 NOTE — Progress Notes (Signed)
Dopplers / PFT'S done today. PCP is Threasa Alpha, PNP Cardio is Dr. Harrell Gave End He states that one of his kidneys "is on the weak side", but hasn't seen a nephrologist. Denies murmur, cp, sob. Echo 2018, Heart cath 01/2018

## 2018-02-20 NOTE — Progress Notes (Signed)
Bilateral Pre CABG Dopplers completed. There is a 1% to 39% ICA stenosis bilaterally. Bilateral vertebral artery flow is antegrade. ABIs and Doppler waveforms indicate normal arterial flow at rest bilaterally. Palmar arch is normal bilaterally. [facility], [place]  02/20/2018 11:36 AM

## 2018-02-20 NOTE — H&P (Signed)
[address]       [place] [address]             [phone]          CARDIOTHORACIC SURGERY HISTORY AND PHYSICAL EXAM  Patient is a 82 year old male with history of aortic insufficiency and ascending thoracic aortic aneurysm who returns to the office today with tentative plans to proceed with aortic valve repair or replacement and resection and grafting of a descending thoracic aortic aneurysm on February 22, 2018.  He was originally seen in consultation on September 05, 2017.  Previous echocardiograms revealed at least moderate if not severe aortic insufficiency with normal left ventricular systolic function. MR angiograms of the thoracic aorta revealed annual aortic ectasia with fusiform aneurysmal enlargement of the aortic root and ascending thoracic aorta with maximum transverse diameter approaching 5 cm. At that time the patient reported stable symptoms of mild exertional shortness of breath and chronic bilateral lower extremity edema consistent with chronic diastolic congestive heart failure, New York Heart Association functional class 1-2.  He underwent transesophageal echocardiogram on September 27, 2017.  Left ventricular function appeared normal with ejection fraction estimated 60-65%, although the left ventricle was "mildly dilated".End-diastolic and end-systolic diameters of the left ventricle were not reported. 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 The aortic valve was trileaflet with incomplete coaptation and at least moderate if not severe aortic insufficiency. The diastolic pressure halftime was reported 360 msand vena contractawidth 6.9 mm.  He states that over the past 6 months he has developed worsening symptoms of exertional shortness of breath.  He denies any resting shortness of breath, PND, orthopnea, or lower extremity edema.  He gets tired more easily than he used to.  He has never had any  chest pain or chest tightness either with activity or at rest.  I saw him on follow-up on January 11, 2018 and we made tentative plans to proceed with surgery later this month.  Since then he underwent diagnostic cardiac catheterization by Dr. Saunders Revel on January 16, 2018.  He was found to have severe three-vessel coronary artery disease with mildly elevated left and right heart filling pressures.  He returns to our office today with tentative plans to proceed with surgery on February 22, 2018.  The remainder of his review of systems is unrevealing.   Past Medical History:  Diagnosis Date  . Arthropathy   . Ascending aortic aneurysm ([place])    a. 12/2016 MRA: 4.3cm @ sinus of valsalva, 4.9cm above sinotubular jxn.  . Cardiac murmur   . Cardiomegaly   . CKD (chronic kidney disease), stage III ([place])    one of his kidneys  is on the 'weak" side  . Coronary artery disease involving native heart without angina pectoris   . Essential hypertension   . GERD (gastroesophageal reflux disease)   . Hemorrhoids   . Left renal artery stenosis ([facility])    a. 01/2017 RA u/s: moderate L RAS.  [name] Kitchen Lower extremity edema   . Nonrheumatic aortic (valve) insufficiency    a. 12/2016 Echo: mild to mod AI.  [name] Kitchen Nonrheumatic mitral (valve) insufficiency    a. 12/2016 Echo: mild to mod MR.  [name] Kitchen Nonrheumatic pulmonary valve insufficiency     Past Surgical History:  Procedure Laterality Date  . CARDIAC CATHETERIZATION     01/16/2018  . CORONARY/GRAFT ANGIOGRAPHY N/A 01/16/2018   Procedure: CORONARY/GRAFT ANGIOGRAPHY;  Surgeon:  End, Harrell Gave, MD;  Location: [facility];  Service: Cardiovascular;  Laterality: N/A;  . ELBOW SURGERY Left   . hemorhoidectomy    . RIGHT HEART CATH N/A 01/16/2018   Procedure: RIGHT HEART CATH;  Surgeon: Nelva Bush, MD;  Location: [facility];  Service: Cardiovascular;  Laterality: N/A;  . SHOULDER SURGERY Right   . TEE WITHOUT CARDIOVERSION N/A 09/27/2017   Procedure:  TRANSESOPHAGEAL ECHOCARDIOGRAM (TEE);  Surgeon: Dorothy Spark, MD;  Location: [facility];  Service: Cardiovascular;  Laterality: N/A;    Family History  Problem Relation Age of Onset  . Hypertension Mother   . Leukemia Father   . Heart attack Paternal Grandmother   . Stroke Paternal Grandfather   . Heart Problems Son 55  . Heart Problems Son 4    Social History Social History   Tobacco Use  . Smoking status: Never Smoker  . Smokeless tobacco: Never Used  Substance Use Topics  . Alcohol use: No  . Drug use: No    Prior to Admission medications   Medication Sig Start Date End Date Taking? Authorizing Provider  acetaminophen (TYLENOL) 500 MG tablet Take 500 mg by mouth daily as needed for moderate pain or headache.   Yes [provider]  amLODipine (NORVASC) 10 MG tablet Take 10 mg by mouth daily.   Yes [provider]  aspirin EC 81 MG tablet Take 1 tablet (81 mg total) by mouth daily. 01/26/17  Yes End, Harrell Gave, MD  atorvastatin (LIPITOR) 20 MG tablet Take 20 mg by mouth daily.   Yes [provider]  Cholecalciferol (VITAMIN D3) 2000 units capsule Take 2,000 Units by mouth daily.    Yes [provider]  doxazosin (CARDURA) 2 MG tablet Take 1 tablet (2 mg total) by mouth daily. Patient taking differently: Take 2 mg by mouth at bedtime.  09/20/17  Yes End, Harrell Gave, MD  enalapril (VASOTEC) 20 MG tablet Take 1 tablet (20 mg total) by mouth 2 (two) times daily. 09/20/17  Yes End, Harrell Gave, MD  ferrous sulfate 325 (65 FE) MG EC tablet Take 325 mg by mouth daily with breakfast.   Yes [provider]  furosemide (LASIX) 40 MG tablet Take 1 tablet (40 mg total) by mouth daily. Patient taking differently: Take 40 mg by mouth daily as needed for fluid or edema.  03/03/17 02/13/18 Yes End, Harrell Gave, MD  gabapentin (NEURONTIN) 100 MG capsule Take 100 mg by mouth 2 (two) times daily.  01/17/18  Yes [provider]    carvedilol (COREG) 25 MG tablet Take 0.5 tablets (12.5 mg total) by mouth 2 (two) times daily. 02/20/18   End, Harrell Gave, MD    No Known Allergies     Review of Systems:              General:                      normal appetite, decreased energy, no weight gain, no weight loss, no fever             Cardiac:                       no chest pain with exertion, no chest pain at rest, + SOB with more strenuous exertion, no resting SOB, no PND, no orthopnea, no palpitations, no arrhythmia, no atrial fibrillation, + LE edema, occasional dizzy spells, no syncope  Respiratory:                 no shortness of breath, no home oxygen, no productive cough, no dry cough, no bronchitis, no wheezing, no hemoptysis, no asthma, no pain with inspiration or cough, no sleep apnea, no CPAP at night             GI:                               no difficulty swallowing, no reflux, no frequent heartburn, no hiatal hernia, no abdominal pain, no constipation, no diarrhea, no hematochezia, no hematemesis, no melena             GU:                              no dysuria,  no frequency, no urinary tract infection, no hematuria, no enlarged prostate, no kidney stones, mild chronic kidney disease             Vascular:                     no pain suggestive of claudication, no pain in feet, no leg cramps, no varicose veins, no DVT, no non-healing foot ulcer             Neuro:                         no stroke, no TIA's, no seizures, no headaches, no temporary blindness one eye,  no slurred speech, no peripheral neuropathy, no chronic pain, no instability of gait, no memory/cognitive dysfunction             Musculoskeletal:         + arthritis, no joint swelling, no myalgias, no difficulty walking, normal mobility              Skin:                            no rash, no itching, no skin infections, no pressure sores or ulcerations             Psych:                         no anxiety, no depression, no  nervousness, no unusual recent stress             Eyes:                           no blurry vision, no floaters, no recent vision changes, + wears glasses or contacts             ENT:                            no hearing loss, no loose or painful teeth, no dentures, last saw dentist within the past 6 months             Hematologic:               no easy bruising, no abnormal bleeding, no clotting disorder, no frequent epistaxis             Endocrine:  no diabetes, does not check CBG's at home                           Physical Exam:              BP (!) 152/70 (BP Location: Right Arm, Patient Position: Sitting, Cuff Size: Large)   Pulse (!) 50   Resp 16   Ht 5\' 7"  (1.702 m)   Wt 180 lb (81.6 kg)   SpO2 97% Comment: ON RA  BMI 28.19 kg/m              General:                        well-appearing             HEENT:                       Unremarkable              Neck:                           no JVD, no bruits, no adenopathy              Chest:                          clear to auscultation, symmetrical breath sounds, no wheezes, no rhonchi              CV:                              RRR, grade III/VI systolic and diastolic murmur best RUSB             Abdomen:                    soft, non-tender, no masses              Extremities:                 warm, well-perfused, pulses palpable, no LE edema             Rectal/GU                   Deferred             Neuro:                         Grossly non-focal and symmetrical throughout             Skin:                            Clean and dry, no rashes, no breakdown   Diagnostic Tests:  Transthoracic Echocardiography  Patient: Prophet, Renwick MR #: [id number] Study Date: 12/14/2016 Gender: M Age: 71 Height: 170.2 cm Weight: 82.9 kg BSA: 2 m^2 Pt. Status: Room:  ATTENDING Default, Provider [phone] [place] End, MD REFERRING Nelva Bush,  MD PERFORMING [place], [place] SONOGRAPHER Pilar Jarvis, RVT, RDCS, RDMS  cc:  ------------------------------------------------------------------- LV EF: 55% - 60%  ------------------------------------------------------------------- History: PMH: Murmur. Aortic valve disease. Mitral valve disease. Risk factors: Hypertension.  ------------------------------------------------------------------- Study Conclusions  - Left ventricle: The  cavity size was mildly dilated. There was mild concentric hypertrophy. Systolic function was normal. The estimated ejection fraction was in the range of 55% to 60%. Images were inadequate for LV wall motion assessment. Doppler parameters are consistent with abnormal left ventricular relaxation (grade 1 diastolic dysfunction). - Aortic valve: There was mild to moderate regurgitation. - Aorta: Aortic root dimension: 37 mm (ED). Ascending aortic diameter: 49 mm (S). Moderately dilated ascending aorta. - Mitral valve: There was mild to moderate regurgitation. - Left atrium: The atrium was mildly dilated.  ------------------------------------------------------------------- Labs, prior tests, procedures, and surgery: ECG. Abnormal.  ------------------------------------------------------------------- Study data: The previous study was not available, so comparison was made to the report of 10/20/2016. Study status: Routine. Procedure: Transthoracic echocardiography. Image quality was fair. Study completion: There were no complications. Transthoracic echocardiography. M-mode, complete 2D, spectral Doppler, and color Doppler. Birthdate: Patient birthdate: 1936-09-19. Age: Patient is 82 yr old. Sex: Gender: male. BMI: 28.6 kg/m^2. Blood pressure: 148/80 Patient status: Outpatient. Study date: Study date: 12/14/2016. Study time:  10:10 AM.  -------------------------------------------------------------------  ------------------------------------------------------------------- Left ventricle: The cavity size was mildly dilated. There was mild concentric hypertrophy. Systolic function was normal. The estimated ejection fraction was in the range of 55% to 60%. Images were inadequate for LV wall motion assessment. Doppler parameters are consistent with abnormal left ventricular relaxation (grade 1 diastolic dysfunction).  ------------------------------------------------------------------- Aortic valve: Poorly visualized. Probably trileaflet; normal thickness, mildly calcified leaflets. Mobility was not restricted. Doppler: Transvalvular velocity was within the normal range. There was no stenosis. There was mild to moderate regurgitation. VTI ratio of LVOT to aortic valve: 1.17. Valve area (VTI): 3.68 cm^2. Indexed valve area (VTI): 1.84 cm^2/m^2. Peak velocity ratio of LVOT to aortic valve: 0.96. Valve area (Vmax): 3.02 cm^2. Indexed valve area (Vmax): 1.51 cm^2/m^2. Mean velocity ratio of LVOT to aortic valve: 0.93. Valve area (Vmean): 2.92 cm^2. Indexed valve area (Vmean): 1.46 cm^2/m^2. Mean gradient (S): 6 mm Hg. Peak gradient (S): 12 mm Hg.  ------------------------------------------------------------------- Aorta: Aortic root: The aortic root was normal in size.  ------------------------------------------------------------------- Mitral valve: Structurally normal valve. Mobility was not restricted. Doppler: Transvalvular velocity was within the normal range. There was no evidence for stenosis. There was mild to moderate regurgitation. Peak gradient (D): 3 mm Hg.  ------------------------------------------------------------------- Left atrium: The atrium was mildly dilated.  ------------------------------------------------------------------- Right ventricle: The cavity size  was normal. Wall thickness was normal. Systolic function was normal.  ------------------------------------------------------------------- Pulmonic valve: Doppler: Transvalvular velocity was within the normal range. There was no evidence for stenosis. There was mild regurgitation.  ------------------------------------------------------------------- Tricuspid valve: Structurally normal valve. Doppler: Transvalvular velocity was within the normal range. There was trivial regurgitation.  ------------------------------------------------------------------- Pulmonary artery: The main pulmonary artery was normal-sized. Systolic pressure could not be accurately estimated.  ------------------------------------------------------------------- Right atrium: The atrium was normal in size.  ------------------------------------------------------------------- Pericardium: There was no pericardial effusion.  ------------------------------------------------------------------- Systemic veins: Inferior vena cava: The vessel was normal in size.  ------------------------------------------------------------------- Measurements  Left ventricle Value Reference LV ID, ED, PLAX chordal (L) 12.4 mm 43 - 52 LV ID, ES, PLAX chordal 36.5 mm 23 - 38 LV fx shortening, PLAX chordal (L) -194 % >=29 LV PW thickness, ED 53.2 mm ---------- Stroke volume, 2D 124 ml ---------- Stroke volume/bsa, 2D 62 ml/m^2 ---------- LV ejection fraction, 1-p A4C 69 % ---------- LV end-diastolic volume, 2-p 916 ml ---------- LV end-systolic volume, 2-p 42 ml ---------- LV ejection fraction, 2-p 68 % ---------- Stroke volume, 2-p  90 ml ---------- LV end-diastolic volume/bsa, 2-p  65 ml/m^2 ---------- LV end-systolic volume/bsa, 2-p 21 ml/m^2 ---------- Stroke volume/bsa, 2-p 44.7 ml/m^2 ---------- LV e&', lateral 8.38 cm/s ---------- LV E/e&', lateral 9.61 ---------- LV e&', medial 5.44 cm/s ---------- LV E/e&', medial 14.8 ---------- LV e&', average 6.91 cm/s ---------- LV E/e&', average 11.65 ----------  LVOT Value Reference LVOT ID, S 20 mm ---------- LVOT area 3.14 cm^2 ---------- LVOT ID 20 mm ---------- LVOT peak velocity, S 169 cm/s ---------- LVOT mean velocity, S 104 cm/s ---------- LVOT VTI, S 39.5 cm ---------- LVOT peak gradient, S 11 mm Hg ---------- Stroke volume (SV), LVOT DP 124.1 ml ---------- Stroke index (SV/bsa), LVOT DP 62 ml/m^2 ----------  Aortic valve Value Reference Aortic valve peak velocity, S 176 cm/s ---------- Aortic valve mean velocity, S 112 cm/s ---------- Aortic valve VTI, S 33.7 cm ---------- Aortic mean gradient, S 6 mm Hg ---------- Aortic peak gradient, S 12 mm Hg ---------- VTI ratio, LVOT/AV 1.17 ---------- Aortic valve area, VTI 3.68 cm^2 ---------- Aortic valve area/bsa, VTI  1.84 cm^2/m^2 ---------- Velocity ratio, peak, LVOT/AV 0.96 ---------- Aortic valve area, peak velocity 3.02 cm^2 ---------- Aortic valve area/bsa, peak 1.51 cm^2/m^2 ---------- velocity Velocity ratio, mean, LVOT/AV 0.93 ---------- Aortic valve area, mean velocity 2.92 cm^2 ---------- Aortic valve area/bsa, mean 1.46 cm^2/m^2 ---------- velocity Aortic regurg pressure half-time 191 ms ----------  Aorta Value Reference Aortic root ID, ED 37 mm ---------- Ascending aorta ID, A-P, S 49 mm ----------  Left atrium Value Reference LA ID, A-P, ES 39 mm ---------- LA ID/bsa, A-P 1.95 cm/m^2 <=2.2 LA volume, S 75.6 ml ---------- LA volume/bsa, S 37.8 ml/m^2 ---------- LA volume, ES, 1-p A4C 60.4 ml ---------- LA volume/bsa, ES, 1-p A4C 30.2 ml/m^2 ---------- LA volume, ES, 1-p A2C 81.8 ml ---------- LA volume/bsa, ES, 1-p A2C 40.9 ml/m^2 ----------  Mitral valve Value Reference Mitral E-wave peak velocity 80.5 cm/s ---------- Mitral A-wave peak velocity 129 cm/s ---------- Mitral deceleration time (H) 458 ms 150 - 230 Mitral peak gradient, D 3 mm Hg ---------- Mitral E/A ratio, peak 0.6 ----------  Tricuspid valve Value Reference Tricuspid regurg peak velocity 303 cm/s ---------- Tricuspid peak RV-RA  gradient 37 mm Hg ----------  Right atrium Value Reference RA ID, S-I, ES, A4C (H) 57.7 mm 34 - 49 RA area, ES, A4C 14.8 cm^2 8.3 - 19.5 RA volume, ES, A/L 30.8 ml ---------- RA volume/bsa, ES, A/L 15.4 ml/m^2 ----------  Right ventricle Value Reference TAPSE 23 mm ---------- RV s&', lateral, S 19.8 cm/s ----------  Pulmonic valve Value Reference Pulmonic valve peak velocity, S 88.8 cm/s ----------  Legend: (L) and (H) mark values outside specified reference range.  ------------------------------------------------------------------- Prepared and Electronically Authenticated by  Kathlyn Sacramento, MD 2018-01-10T12:33:47   MRA CHEST WITH OR WITHOUT CONTRAST  TECHNIQUE: Angiographic images of the chest were obtained using MRA technique without intravenous contrast.  CONTRAST: Contrast was not administered due to underlying chronic kidney disease.  COMPARISON: None.  FINDINGS: VASCULAR  Aorta: There is aneurysmal disease of the ascending thoracic aorta. At the level of the sinuses of Valsalva, the aorta measures up to 4.3 cm in greatest measured diameter. The aortic valve is trileaflet. Above the sinuses of Valsalva, the aorta reaches maximal measured diameter of approximately 4.9 cm. The proximal arch measures 3.8 cm. The distal arch measures 2.9 cm. The descending thoracic aorta measures 2.6 cm. No evidence of aortic dissection. Proximal great vessels show normal branching anatomy.  Heart: The heart appears mildly enlarged no significant pericardial fluid identified.  Pulmonary Arteries:  Central pulmonary arteries appear to be  normal in caliber.  NON-VASCULAR  No significant nonvascular findings. No incidental lymphadenopathy or masses. Visualized bony structures are unremarkable.  IMPRESSION: Aneurysmal disease of the ascending thoracic aorta. At the level of the sinuses of Valsalva, mild dilatation is present with the aorta measuring 4.3 cm up to 4.3 cm. The ascending thoracic aorta reaches a maximal caliber of 4.9 cm. There is no evidence of aortic dissection.   Electronically Signed By: Aletta Edouard M.D. On: 12/27/2016 16:03   MRA CHEST WITH OR WITHOUT CONTRAST  TECHNIQUE: Angiographic images of the chest were obtained using MRA technique without intravenous contrast.  CONTRAST: None  COMPARISON: MR 12/27/2016  FINDINGS: VASCULAR  Aorta: Thoracic aortic aneurysm again demonstrated, with MR performed today without gadolinium contrast given chronic renal insufficiency.  The overall configuration and size of the thoracic abdominal aorta appears unchanged since the MR dated 12/27/2016. There are no 2 sequences performed on today's study on which a reproducible diameter can be measured at the annulus, sino-tubular junction, or ascending aorta.  The annulus estimated approximately 3.8 cm  The sino-tubular junction estimated 5.0 cm.  Greatest diameter of the ascending aorta measured at the level of main pulmonary artery is approximately 4.8 cm. Proximal descending thoracic aorta measures 3.0 cm.  Diameter at the aortic hiatus measures approximately 2.7 cm  Three vessel arch.  No periaortic fluid or dissection flap.  Unremarkable diameter of the main pulmonary artery.  Unchanged appearance of far with no pericardial fluid.  NON-VASCULAR  Unremarkable appearance of the musculoskeletal structures.  Unremarkable appearance of the lungs.  Unremarkable appearance of mediastinal structures with no adenopathy.  IMPRESSION: Re- demonstration of  ascending aortic aneurysm, which, within the limits of the study, is relatively unchanged from the MR dated 12/27/2016. Although no 2 sequences performed on the current study allow a reproducible diameter of the aortic root or the ascending aorta, estimated diameter at the sino-tubular junction is 5.0 cm and at the level of the main pulmonary artery 4.8 cm.  Signed,  Dulcy Fanny. Earleen Newport, DO  [facility]  [facility]   Electronically Signed By: Corrie Mckusick D.O. On: 06/06/2017 12:17    Transesophageal Echocardiography  (Report amended )  Patient: Kao, Conry MR #: [id number] Study Date: 09/27/2017 Gender: M Age: 37 Height: 170.2 cm Weight: 80.7 kg BSA: 1.97 m^2 Pt. Status: Room:  ORDERING Nelva Bush, MD REFERRING Nelva Bush, MD ADMITTING Ena Dawley, M.D. ATTENDING Ena Dawley, M.D. PERFORMING Ena Dawley, M.D. SONOGRAPHER Roseanna Rainbow  cc:  ------------------------------------------------------------------- LV EF: 60% - 65%  ------------------------------------------------------------------- Indications: Aortic insufficiency 424.1.  ------------------------------------------------------------------- History: Risk factors: Hypertension. Dyslipidemia.  ------------------------------------------------------------------- Study Conclusions  - Left ventricle: The cavity size was mildly dilated. Systolic function was normal. The estimated ejection fraction was in the range of 60% to 65%. Wall motion was normal; there were no regional wall motion abnormalities. - Descending aorta: The descending aorta was normal in size. - Mitral valve: There was mild to moderate regurgitation. - Left atrium: No evidence of thrombus in the atrial cavity or appendage. No evidence of thrombus in the atrial cavity  or appendage. No evidence of thrombus in the appendage. - Right atrium: The atrium was dilated. No evidence of thrombus in the atrial cavity or appendage.  Impressions:  - Aortic annulus is dilated. The maximum diameter at the sinuses was 50 mm, at the sinotubular junction 47 mm, at the ascending aorta 47 mm. Aortic vale leaflets don&'t coapt. There  is central jet of aortic regurgitation at the upper level of moderate aortic regurgitation. Vena contracta 6.9 mm. PHT 360 ms.  ------------------------------------------------------------------- Study data: Study status: Routine. Consent: The risks, benefits, and alternatives to the procedure were explained to the patient and informed consent was obtained. Procedure: Initial setup. The patient was brought to the laboratory. Surface ECG leads were monitored. Sedation. Conscious sedation was administered by cardiology staff. Transesophageal echocardiography. An adult multiplane transesophageal probe was inserted by the attending cardiologist. Image quality was adequate. Study completion: The patient tolerated the procedure well. There were no complications. Administered medications: Fentanyl, 34mcg, IV. Midazolam, 5mg , IV. Diagnostic transesophageal echocardiography. 2D and color Doppler. Birthdate: Patient birthdate: 1936-10-22. Age: Patient is 82 yr old. Sex: Gender: male. BMI: 27.9 kg/m^2. Blood pressure: 171/75 Patient status: Inpatient. Study date: Study date: 09/27/2017. Study time: 08:01 AM. Location: Endoscopy.  -------------------------------------------------------------------  ------------------------------------------------------------------- Left ventricle: The cavity size was mildly dilated. Systolic function was normal. The estimated ejection fraction was in the range of 60% to 65%. Wall motion was normal; there were no regional wall motion  abnormalities.  ------------------------------------------------------------------- Aortic valve: Structurally normal valve. Trileaflet; normal thickness leaflets. Cusp separation was normal. Doppler: There was no significant regurgitation.  ------------------------------------------------------------------- Aorta: There was severe non-mobile atheroma. There was no evidence for dissection. Descending aorta: The descending aorta was normal in size.  ------------------------------------------------------------------- Mitral valve: Structurally normal valve. Leaflet separation was normal. Doppler: There was mild to moderate regurgitation.  ------------------------------------------------------------------- Left atrium: The atrium was normal in size. No evidence of thrombus in the atrial cavity or appendage. No evidence of thrombus in the atrial cavity or appendage. No evidence of thrombus in the appendage. The appendage was morphologically a left appendage, multilobulated, and of normal size. Emptying velocity was normal.  ------------------------------------------------------------------- Right ventricle: The cavity size was normal. Wall thickness was normal. Systolic function was normal.  ------------------------------------------------------------------- Pulmonic valve: Structurally normal valve.  ------------------------------------------------------------------- Tricuspid valve: Structurally normal valve. Leaflet separation was normal. Doppler: There was mild regurgitation.  ------------------------------------------------------------------- Pulmonary artery: The main pulmonary artery was normal-sized.  ------------------------------------------------------------------- Right atrium: The atrium was dilated. No evidence of thrombus in the atrial cavity or appendage. The appendage was morphologically a right  appendage.  ------------------------------------------------------------------- Pericardium: There was no pericardial effusion.  ------------------------------------------------------------------- Measurements  LVOT Value LVOT ID, S 18 mm LVOT area 2.54 cm^2  Aortic valve Value Aortic regurg pressure half-time 374 ms  Legend: (L) and (H) mark values outside specified reference range.  ------------------------------------------------------------------- Lance Morin, M.D. 2018-10-23T10:34:48    Cardiac/Chest CTA  TECHNIQUE: The patient was scanned on a [employer]. A 120 kV retrospective scan was triggered in the descending thoracic aorta at 111 HU's. Gantry rotation speed was 250 msecs and collimation was .6 mm. No beta blockade or nitro were given. The 3D data set was reconstructed in 5% intervals of the R-R cycle. Systolic and diastolic phases were analyzed on a dedicated work station using MPR, MIP and VRT modes. The patient received 80 cc of contrast.  FINDINGS: A 120 kV prospective scan was triggered in the descending thoracic aorta at 111 HU's. Axial non-contrast 3 mm slices were carried out through the heart. The data set was analyzed on a dedicated work station and scored using the [place]. Gantry rotation speed was 250 msecs and collimation was .6 mm. 5 mg of iv Metoprolol and 0.8 mg of sl NTG was given. The 3D data set was reconstructed in 5% intervals of the 67-82 % of the R-R  cycle. Diastolic phases were analyzed on a dedicated work station using MPR, MIP and VRT modes. The patient received 80 cc of contrast.  Aortic Valve: Trileaflet, minimally thickened with no calcifications and central non-coaptation secondary to dilated sinuses with maximum diameter at the sinus level 45 mm.  Valvular annulus and LVOT are rather small.  Aorta: There is annulo-aortic ectasia and aneurysmal dilatation of the sinuses, sinotubular junction and ascending aorta. There is no dissection. There are no calcifications in the ascending aorta and moderate diffuse calcifications and atherosclerotic plaque in the aortic arch and descending thoracic aorta.  Sinotubular Junction: 46 x 44 mm  Ascending Thoracic Aorta: 46 x 45 mm  Aortic Arch: 30 x 30 mm  Descending Thoracic Aorta: 24 x 24 mm  Sinus of Valsalva Measurements:  Non-coronary: 40 mm  Right -coronary: 41 mm  Left -coronary: 41 mm  Coronary Artery Height above Annulus:  Left Main: 17 mm  Right Coronary: 18 mm  Virtual Basal Annulus Measurements:  Maximum/Minimum Diameter: 21.4 x 16.0 mm  Perimeter: 62.5 mm  Area: 267 mm2  Coronary Arteries: Normal coronary origin. Right dominance. There is suboptimal coronary vasodilation.  RCA is a medium size dominant artery that gives rise to PDA and PLVB. There is severe diffuse mixed plaque suspicious for at least 50-69% but possibly > 70% stenosis in the mid and distal RCA. PDA and PLA are poorly visualized.  Left main is a large and long artery that gives rise to LAD and LCX arteries. Left main has no plaque.  LAD is a large vessel that gives rise to one diagonal branch. There is diffuse almost circumferential calcified plaque in the proximal LAD with associated stenosis 25-50%, there is a severe mixed plaque in the mid LAD with stenosis > 70%. Mid to distal LAD has mild diffuse plaque.  D1 has an ostial calcified plaque with 60-69% stenosis.  LCX is a non-dominant artery that gives rise to one large OM1 branch. There is moderate mixed plaque at the takeoff of OM1 with at least 50-69% stenosis.  OM1 is a large branch that has moderate ostial/proximal plaque with 50-69% but possibly > 70% stenosis.  Other findings:  Normal pulmonary  vein drainage into the left atrium.  Normal let atrial appendage without a thrombus.  Normal size of the pulmonary artery.  IMPRESSION: 1. Trileaflet aortic valve with minimally thickened leaflets with no calcifications and central non-coaptation secondary to dilated sinuses with maximum diameter at the sinus level of 45 mm. Valvular annulus and LVOT are rather small.  2. 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 There is no dissection. There are no calcifications in the ascending aorta and moderate diffuse calcifications and atherosclerotic plaque in the aortic arch and descending thoracic aorta.  3. Normal coronary origin with right dominance.  4. Coronary calcium score of 1074. This was 68 percentile for age and sex matched control.  5. Coronary arteries are suboptimally vasodilated. However, there is moderate to severe diffuse plaque with suspicion for a three vessel disease. A cardiac catheterization is recommended prior to surgery.   Electronically Signed   By: Ena Dawley   On: 01/12/2018 11:47     RIGHT HEART CATH  CORONARY/GRAFT ANGIOGRAPHY  Conclusion   Conclusions: 1. Significant 3-vessel coronary artery disease, as detailed below. 2. Mildly elevated left and right heart filling pressures. 3. Upper normal pulmonary artery pressure. 4. Normal Fick cardiac output/index. 5. Renal  angiography not performed due to radiation/contrast needed for coronary angiography.  Recommendations: 1. Proceed with aorta/aortic valve intervention, per Dr. Roxy Manns. CABG at the time of surgery will need to be considered, given multivessel CAD. 2. Aggressive secondary prevention. 3. We will discuss further non-invasive follow-up of renal artery stenosis when Mr. Navarez returns for follow-up.  Nelva Bush, MD [facility] Pager: [phone]     Indications   Thoracic aortic aneurysm without rupture ([place]) [I71.2 (ICD-10-CM)]  Nonrheumatic aortic valve insufficiency [I35.1 (ICD-10-CM)]  Procedural Details/Technique   Technical Details Indication: 82 y.o. year-old man with history of hypertension, aortic regurgitation, thoracic aortic aneurysm, coronary artery disease by cardiac CTA, and chronic kidney disease stage 3 with left renal artery stenosis, presenting for preoperative evaluation in anticipation of aortic valve and thoracic aortic aneurysm repair.  GFR: 35 ml/min  Procedure: The risks, benefits, complications, treatment options, and expected outcomes were discussed with the patient. The patient and/or family concurred with the proposed plan, giving informed consent. The patient was brought to the cath lab after IV hydration was begun and oral premedication was given. The patient was further sedated with Versed and Fentanyl. The right wrist was assessed with a modified Allens test which was normal. The right wrist and elbow were prepped and draped in a sterile fashion. 1% lidocaine was used for local anesthesia. A previously placed antecubital vein IV was exchanged for a 469F slender Glidesheath using modified Seldinger technique. Right heart catheterization was performed by advancing a 469F balloon-tipped catheter through the right heart chambers into the pulmonary capillary wedge position. Pressure measurements and oxygen saturations were obtained.  Using the modified Seldinger access technique, a 69F slender Glidesheath was placed in the right radial artery. 3 mg Verapamil was given through the sheath. Heparin 4,000 units were administered. Selective coronary angiography was performed using 469F JL4 and MPA2 catheters to engage the left and right coronary arteries, respectively. Left heart catheterization was attempted with multiple catheters (angled pigtail, JR4, and MPA2) without success.  At the end of the procedure, the radial artery  sheath was removed and a TR band applied to achieve patent hemostasis. There were no immediate complications. The patient was taken to the recovery area in stable condition.  Contrast used: 35 mL Isovue Fluoroscopy time: 16.1 min Radiation dose: 303 mGy   Estimated blood loss <50 mL.  During this procedure the patient was administered the following to achieve and maintain moderate conscious sedation: Versed 1 mg, Fentanyl 25 mcg, while the patient's heart rate, blood pressure, and oxygen saturation were continuously monitored. The period of conscious sedation was 34 minutes, of which I was present face-to-face 100% of this time.  Complications   Complications documented before study signed (01/16/2018 5:13 PM EST)    No complications were associated with this study.  Documented by Nelva Bush, MD - 01/16/2018 5:07 PM EST    Coronary Findings   Diagnostic  Dominance: Right  Left Anterior Descending  Ost LAD lesion 20% stenosed  Ost LAD lesion is 20% stenosed. The lesion is mildly calcified.  Prox LAD lesion 40% stenosed  Prox LAD lesion is 40% stenosed.  Prox LAD to Mid LAD lesion 70% stenosed  Prox LAD to Mid LAD lesion is 70% stenosed. The lesion is irregular.  First Diagonal Branch  Vessel is small in size.  First Septal Branch  Vessel is moderate in size.  Second Diagonal Branch  Vessel is small in size.  Left Circumflex  Prox Cx lesion 60% stenosed  Prox Cx lesion is 60% stenosed.  First Obtuse Marginal Branch  Vessel is small in size.  Second Obtuse Marginal Branch  Vessel is moderate in size.  2nd Mrg lesion 60% stenosed  2nd Mrg lesion is 60% stenosed.  Third Obtuse Marginal Branch  Vessel is moderate in size.  Ost 3rd Mrg to 3rd Mrg lesion 85% stenosed  Ost 3rd Mrg to 3rd Mrg lesion is 85% stenosed.  Right Coronary Artery  Vessel is moderate in size.  Prox RCA to Mid RCA lesion 100% stenosed  Prox RCA to Mid RCA lesion is 100% stenosed. The lesion is  chronically occluded with left-to-right and right-to-right collateral flow.  Right Posterior Descending Artery  Collaterals  RPDA filled by collaterals from 2nd Sept.    Intervention   No interventions have been documented.  Right Heart   Right Heart Pressures RA (mean): 9 mmHg RV (S/EDP): 43/10 mmHg PA (S/D, mean): 37/15 (22) mmHg PCWP (mean): 18 mmHg  Ao sat: 96% PA sat: 64%  Fick CO: 5.1 L/min Fick CI: 2.6 L/min/m^2  Coronary Diagrams   Diagnostic Diagram       Implants        No implant documentation for this case.  MERGE Images   Show images for CARDIAC CATHETERIZATION   Link to Procedure Log   Procedure Log    Hemo Data    Most Recent Value  Fick Cardiac Output 5.08 L/min  Fick Cardiac Output Index 2.63 (L/min)/BSA  RA A Wave 10 mmHg  RA V Wave 8 mmHg  RA Mean 7 mmHg  RV Systolic Pressure 36 mmHg  RV Diastolic Pressure 1 mmHg  RV EDP 6 mmHg  PA Systolic Pressure 33 mmHg  PA Diastolic Pressure 0 mmHg  PA Mean 22 mmHg  PW A Wave 19 mmHg  PW V Wave 28 mmHg  PW Mean 17 mmHg  AO Systolic Pressure 371 mmHg  AO Diastolic Pressure 60 mmHg  AO Mean 85 mmHg  QP/QS 1  TPVR Index 8.35 HRUI  TSVR Index 32.26 HRUI  PVR SVR Ratio 0.06  TPVR/TSVR Ratio 0.26      Impression:  Patient has an ascending thoracic aortic aneurysmwith severeaortic insufficiency and severe three-vessel coronary artery disease. He describes progressive symptoms of exertional shortness of breath, fatigue and chronic bilateral lower extremity edema consistent with chronic diastolic congestive heart failure, [place] Heart Association functional class II.  He denies any symptoms of exertional chest pain or chest tightness.I have personally reviewed the patient'sTEE, cardiac gated CT angiogram, and diagnostic cardiac catheterization. TEE reveals annuloaortic ectasia with trileaflet aortic valve and moderate -severe aortic insufficiency. The left ventricle is  somewhat dilated but left ventricular systolic function appears normal. Cardiac gated CT angiogram of the heart has not yet been formally read by a cardiologist, but there is moderate annuloaortic ectasia with aneurysmal enlargement of the entire aortic root and ascending thoracic aorta. At the level of the sinuses of Valsalva the transverse diameter approaches 5.0 cm. It measures approximately 4.7-4.8 cm in the mid a sending aorta and tapers to approximately 3.5 cm immediately prior to takeoff of the innominate artery. There is no aortic dissection.  Catheterization revealed severe three-vessel coronary artery disease with long segment 70% stenosis of mid left anterior descending coronary artery, 60% stenosis of the left circumflex with 85% stenosis of the second obtuse marginal branch, and 100% chronic occlusion of the right coronary artery with left-to-right collaterals filling small terminal branches.  Left ventricular end-diastolic pressure and pulmonary  artery pressures were mildly elevated.     Plan:  I have again reviewed the indications, risks, and potential benefits of aortic valve repair or replacement and surgical repair of the patient's ascending thoracic aorta with the patient in the office today.  The significance of the patient's newly discovered coronary artery disease has been discussed, as well as need for surgical revascularization.  The natural history of aortic insufficiency and aortic aneurysm were reviewed, as was long term prognosis with medical therapy alone.  Surgical options were discussed at length including surgical repair of the aortic aneurysm with conventional surgical aortic valve replacement versus an attempt at aortic valve repair.  The patient understands and accepts all potential associated risks of surgery including but not limited to risk of death, stroke, myocardial infarction, congestive heart failure, respiratory failure, renal failure, pneumonia, bleeding  requiring blood transfusion and or reexploration, arrhythmia, heart block or bradycardia requiring permanent pacemaker, aortic dissection or other major vascular complication, pleural effusions or other delayed complications related to continued congestive heart failure, and other late complications related to valve replacement including structural valve deterioration and failure, thrombosis, endocarditis, or paravalvular leak, late recurrence of severe aortic insufficiency following an attempt of valve repair, or late recurrence of symptomatic coronary artery disease following coronary artery bypass grafting.  Expectations for the patient's postoperative convalescence have been discussed in detail.  Given his living arrangements at home he understands that he will likely need short-term placement in a skilled nursing facility at the time of hospital discharge, and he has already identified to facilities that he would prefer if possible.  All of his questions have been addressed.  We plan to proceed with surgery on February 22, 2018.    Valentina Gu. Roxy Manns, MD 02/06/2018 3:13 PM

## 2018-02-21 MED ORDER — TRANEXAMIC ACID (OHS) BOLUS VIA INFUSION
15.0000 mg/kg | INTRAVENOUS | Status: AC
  Administered 2018-02-22: 1207.5 mg via INTRAVENOUS
  Filled 2018-02-21: qty 1208

## 2018-02-21 MED ORDER — KENNESTONE BLOOD CARDIOPLEGIA (KBC) MANNITOL SYRINGE (20%, 32ML)
32.0000 mL | Freq: Once | INTRAVENOUS | Status: DC
  Filled 2018-02-21: qty 32

## 2018-02-21 MED ORDER — SODIUM CHLORIDE 0.9 % IV SOLN
INTRAVENOUS | Status: DC
  Filled 2018-02-21: qty 30

## 2018-02-21 MED ORDER — VANCOMYCIN HCL 1000 MG IV SOLR
INTRAVENOUS | Status: AC
  Administered 2018-02-22: 1000 mL
  Filled 2018-02-21 (×2): qty 1000

## 2018-02-21 MED ORDER — DEXMEDETOMIDINE HCL IN NACL 400 MCG/100ML IV SOLN
0.1000 ug/kg/h | INTRAVENOUS | Status: DC
  Filled 2018-02-21: qty 100

## 2018-02-21 MED ORDER — KENNESTONE BLOOD CARDIOPLEGIA VIAL
13.0000 mL | Freq: Once | Status: DC
  Filled 2018-02-21: qty 13

## 2018-02-21 MED ORDER — TRANEXAMIC ACID (OHS) PUMP PRIME SOLUTION
2.0000 mg/kg | INTRAVENOUS | Status: DC
  Filled 2018-02-21: qty 1.61

## 2018-02-21 MED ORDER — TRANEXAMIC ACID 1000 MG/10ML IV SOLN
1.5000 mg/kg/h | INTRAVENOUS | Status: AC
  Administered 2018-02-22: 1.5 mg/kg/h via INTRAVENOUS
  Filled 2018-02-21 (×2): qty 25

## 2018-02-21 MED ORDER — CEFUROXIME SODIUM 750 MG IJ SOLR
750.0000 mg | INTRAMUSCULAR | Status: DC
  Filled 2018-02-21: qty 750

## 2018-02-21 MED ORDER — POTASSIUM CHLORIDE 2 MEQ/ML IV SOLN
80.0000 meq | INTRAVENOUS | Status: DC
Start: 2018-02-22 — End: 2018-02-22
  Filled 2018-02-21: qty 40

## 2018-02-21 MED ORDER — INSULIN REGULAR HUMAN 100 UNIT/ML IJ SOLN
INTRAMUSCULAR | Status: DC
  Filled 2018-02-21: qty 1

## 2018-02-21 MED ORDER — SODIUM CHLORIDE 0.9 % IV SOLN
1.5000 g | INTRAVENOUS | Status: AC
  Administered 2018-02-22: 1.5 g via INTRAVENOUS
  Filled 2018-02-21: qty 1.5

## 2018-02-21 MED ORDER — EPINEPHRINE PF 1 MG/ML IJ SOLN
0.0000 ug/min | INTRAVENOUS | Status: DC
  Filled 2018-02-21: qty 4

## 2018-02-21 MED ORDER — VANCOMYCIN HCL 10 G IV SOLR
1250.0000 mg | INTRAVENOUS | Status: AC
  Administered 2018-02-22: 1250 mg via INTRAVENOUS
  Filled 2018-02-21: qty 1250

## 2018-02-21 MED ORDER — MAGNESIUM SULFATE 50 % IJ SOLN
40.0000 meq | INTRAMUSCULAR | Status: DC
Start: 2018-02-22 — End: 2018-02-22
  Filled 2018-02-21: qty 9.85

## 2018-02-21 MED ORDER — DOPAMINE-DEXTROSE 3.2-5 MG/ML-% IV SOLN
0.0000 ug/kg/min | INTRAVENOUS | Status: AC
  Administered 2018-02-22: 3 ug/kg/min via INTRAVENOUS
  Filled 2018-02-21: qty 250

## 2018-02-21 MED ORDER — SODIUM CHLORIDE 0.9 % IV SOLN
30.0000 ug/min | INTRAVENOUS | Status: AC
  Administered 2018-02-22: 50 ug/min via INTRAVENOUS
  Filled 2018-02-21: qty 20

## 2018-02-21 MED ORDER — METOPROLOL TARTRATE 12.5 MG HALF TABLET
12.5000 mg | ORAL_TABLET | Freq: Once | ORAL | Status: DC
  Filled 2018-02-21: qty 1

## 2018-02-21 MED ORDER — NITROGLYCERIN IN D5W 200-5 MCG/ML-% IV SOLN
2.0000 ug/min | INTRAVENOUS | Status: DC
  Filled 2018-02-21: qty 250

## 2018-02-21 MED ORDER — PLASMA-LYTE 148 IV SOLN
INTRAVENOUS | Status: AC
  Administered 2018-02-22: 500 mL
  Filled 2018-02-21: qty 2.5

## 2018-02-21 MED ORDER — MILRINONE LACTATE IN DEXTROSE 20-5 MG/100ML-% IV SOLN
0.1250 ug/kg/min | INTRAVENOUS | Status: DC
  Filled 2018-02-21: qty 100

## 2018-02-21 MED ORDER — CHLORHEXIDINE GLUCONATE 0.12 % MT SOLN
15.0000 mL | Freq: Once | OROMUCOSAL | Status: AC
  Administered 2018-02-22: 15 mL via OROMUCOSAL
  Filled 2018-02-21: qty 15

## 2018-02-22 ENCOUNTER — Inpatient Hospital Stay (HOSPITAL_COMMUNITY)
Admission: RE | Admit: 2018-02-22 | Discharge: 2018-02-22 | Disposition: A | Discharge status: Home / Self Care | Payer: Medicare HMO | Source: Ambulatory Visit | Attending: Thoracic Surgery (Cardiothoracic Vascular Surgery) | Admitting: Thoracic Surgery (Cardiothoracic Vascular Surgery)

## 2018-02-22 ENCOUNTER — Encounter (HOSPITAL_COMMUNITY)
Admission: RE | Disposition: A | Discharge status: Skilled Nursing Facility | Payer: Self-pay | Source: Ambulatory Visit | Attending: Thoracic Surgery (Cardiothoracic Vascular Surgery)

## 2018-02-22 ENCOUNTER — Encounter (HOSPITAL_COMMUNITY): Payer: Self-pay

## 2018-02-22 ENCOUNTER — Inpatient Hospital Stay (HOSPITAL_COMMUNITY): Payer: Medicare HMO | Admitting: Anesthesiology

## 2018-02-22 ENCOUNTER — Inpatient Hospital Stay (HOSPITAL_COMMUNITY): Payer: Medicare HMO

## 2018-02-22 ENCOUNTER — Inpatient Hospital Stay (HOSPITAL_COMMUNITY)
Admission: RE | Admit: 2018-02-22 | Discharge: 2018-03-03 | DRG: 219 | Disposition: A | Discharge status: Skilled Nursing Facility | Payer: Medicare HMO | Source: Ambulatory Visit | Attending: Thoracic Surgery (Cardiothoracic Vascular Surgery) | Admitting: Thoracic Surgery (Cardiothoracic Vascular Surgery)

## 2018-02-22 DIAGNOSIS — D62 Acute posthemorrhagic anemia: Secondary | ICD-10-CM | POA: Diagnosis not present

## 2018-02-22 DIAGNOSIS — D696 Thrombocytopenia, unspecified: Secondary | ICD-10-CM | POA: Diagnosis not present

## 2018-02-22 DIAGNOSIS — I358 Other nonrheumatic aortic valve disorders: Secondary | ICD-10-CM | POA: Diagnosis not present

## 2018-02-22 DIAGNOSIS — M6281 Muscle weakness (generalized): Secondary | ICD-10-CM | POA: Diagnosis not present

## 2018-02-22 DIAGNOSIS — I130 Hypertensive heart and chronic kidney disease with heart failure and stage 1 through stage 4 chronic kidney disease, or unspecified chronic kidney disease: Secondary | ICD-10-CM | POA: Diagnosis not present

## 2018-02-22 DIAGNOSIS — R262 Difficulty in walking, not elsewhere classified: Secondary | ICD-10-CM | POA: Diagnosis not present

## 2018-02-22 DIAGNOSIS — E876 Hypokalemia: Secondary | ICD-10-CM | POA: Diagnosis not present

## 2018-02-22 DIAGNOSIS — I080 Rheumatic disorders of both mitral and aortic valves: Secondary | ICD-10-CM | POA: Diagnosis not present

## 2018-02-22 DIAGNOSIS — Q2543 Congenital aneurysm of aorta: Secondary | ICD-10-CM

## 2018-02-22 DIAGNOSIS — Z952 Presence of prosthetic heart valve: Secondary | ICD-10-CM

## 2018-02-22 DIAGNOSIS — I25118 Atherosclerotic heart disease of native coronary artery with other forms of angina pectoris: Secondary | ICD-10-CM | POA: Diagnosis present

## 2018-02-22 DIAGNOSIS — K219 Gastro-esophageal reflux disease without esophagitis: Secondary | ICD-10-CM | POA: Diagnosis present

## 2018-02-22 DIAGNOSIS — N1832 Chronic kidney disease, stage 3b: Secondary | ICD-10-CM | POA: Diagnosis present

## 2018-02-22 DIAGNOSIS — J181 Lobar pneumonia, unspecified organism: Secondary | ICD-10-CM | POA: Diagnosis not present

## 2018-02-22 DIAGNOSIS — Z8249 Family history of ischemic heart disease and other diseases of the circulatory system: Secondary | ICD-10-CM

## 2018-02-22 DIAGNOSIS — I5033 Acute on chronic diastolic (congestive) heart failure: Secondary | ICD-10-CM | POA: Diagnosis not present

## 2018-02-22 DIAGNOSIS — Z951 Presence of aortocoronary bypass graft: Secondary | ICD-10-CM

## 2018-02-22 DIAGNOSIS — I509 Heart failure, unspecified: Secondary | ICD-10-CM | POA: Diagnosis not present

## 2018-02-22 DIAGNOSIS — Z8679 Personal history of other diseases of the circulatory system: Secondary | ICD-10-CM

## 2018-02-22 DIAGNOSIS — J90 Pleural effusion, not elsewhere classified: Secondary | ICD-10-CM | POA: Diagnosis not present

## 2018-02-22 DIAGNOSIS — E785 Hyperlipidemia, unspecified: Secondary | ICD-10-CM | POA: Diagnosis present

## 2018-02-22 DIAGNOSIS — Z7982 Long term (current) use of aspirin: Secondary | ICD-10-CM | POA: Diagnosis not present

## 2018-02-22 DIAGNOSIS — I440 Atrioventricular block, first degree: Secondary | ICD-10-CM | POA: Diagnosis present

## 2018-02-22 DIAGNOSIS — I2510 Atherosclerotic heart disease of native coronary artery without angina pectoris: Secondary | ICD-10-CM | POA: Diagnosis present

## 2018-02-22 DIAGNOSIS — I350 Nonrheumatic aortic (valve) stenosis: Secondary | ICD-10-CM | POA: Diagnosis not present

## 2018-02-22 DIAGNOSIS — N183 Chronic kidney disease, stage 3 unspecified: Secondary | ICD-10-CM | POA: Diagnosis present

## 2018-02-22 DIAGNOSIS — J9801 Acute bronchospasm: Secondary | ICD-10-CM | POA: Diagnosis not present

## 2018-02-22 DIAGNOSIS — J9811 Atelectasis: Secondary | ICD-10-CM | POA: Diagnosis not present

## 2018-02-22 DIAGNOSIS — Z806 Family history of leukemia: Secondary | ICD-10-CM | POA: Diagnosis not present

## 2018-02-22 DIAGNOSIS — I340 Nonrheumatic mitral (valve) insufficiency: Secondary | ICD-10-CM | POA: Diagnosis present

## 2018-02-22 DIAGNOSIS — I7121 Aneurysm of the ascending aorta, without rupture: Secondary | ICD-10-CM

## 2018-02-22 DIAGNOSIS — I351 Nonrheumatic aortic (valve) insufficiency: Secondary | ICD-10-CM

## 2018-02-22 DIAGNOSIS — I2582 Chronic total occlusion of coronary artery: Secondary | ICD-10-CM | POA: Diagnosis present

## 2018-02-22 DIAGNOSIS — I701 Atherosclerosis of renal artery: Secondary | ICD-10-CM | POA: Diagnosis present

## 2018-02-22 DIAGNOSIS — N170 Acute kidney failure with tubular necrosis: Secondary | ICD-10-CM | POA: Diagnosis not present

## 2018-02-22 DIAGNOSIS — Z953 Presence of xenogenic heart valve: Secondary | ICD-10-CM | POA: Diagnosis not present

## 2018-02-22 DIAGNOSIS — I10 Essential (primary) hypertension: Secondary | ICD-10-CM | POA: Diagnosis not present

## 2018-02-22 DIAGNOSIS — I712 Thoracic aortic aneurysm, without rupture, unspecified: Secondary | ICD-10-CM | POA: Diagnosis present

## 2018-02-22 DIAGNOSIS — D649 Anemia, unspecified: Secondary | ICD-10-CM | POA: Diagnosis not present

## 2018-02-22 DIAGNOSIS — R0902 Hypoxemia: Secondary | ICD-10-CM | POA: Diagnosis not present

## 2018-02-22 DIAGNOSIS — I083 Combined rheumatic disorders of mitral, aortic and tricuspid valves: Secondary | ICD-10-CM | POA: Diagnosis not present

## 2018-02-22 DIAGNOSIS — R079 Chest pain, unspecified: Secondary | ICD-10-CM | POA: Diagnosis not present

## 2018-02-22 DIAGNOSIS — I4891 Unspecified atrial fibrillation: Secondary | ICD-10-CM | POA: Diagnosis not present

## 2018-02-22 DIAGNOSIS — I129 Hypertensive chronic kidney disease with stage 1 through stage 4 chronic kidney disease, or unspecified chronic kidney disease: Secondary | ICD-10-CM | POA: Diagnosis not present

## 2018-02-22 DIAGNOSIS — Z09 Encounter for follow-up examination after completed treatment for conditions other than malignant neoplasm: Secondary | ICD-10-CM

## 2018-02-22 DIAGNOSIS — Z954 Presence of other heart-valve replacement: Secondary | ICD-10-CM | POA: Diagnosis not present

## 2018-02-22 DIAGNOSIS — Z959 Presence of cardiac and vascular implant and graft, unspecified: Secondary | ICD-10-CM | POA: Diagnosis not present

## 2018-02-22 DIAGNOSIS — Z48812 Encounter for surgical aftercare following surgery on the circulatory system: Secondary | ICD-10-CM | POA: Diagnosis not present

## 2018-02-22 DIAGNOSIS — I25119 Atherosclerotic heart disease of native coronary artery with unspecified angina pectoris: Secondary | ICD-10-CM | POA: Diagnosis present

## 2018-02-22 DIAGNOSIS — Z9889 Other specified postprocedural states: Secondary | ICD-10-CM

## 2018-02-22 HISTORY — PX: TEE WITHOUT CARDIOVERSION: SHX5443

## 2018-02-22 HISTORY — PX: CORONARY ARTERY BYPASS GRAFT: SHX141

## 2018-02-22 HISTORY — DX: Presence of xenogenic heart valve: Z95.3

## 2018-02-22 HISTORY — PX: AORTIC VALVE REPAIR: SHX6306

## 2018-02-22 HISTORY — PX: THORACIC AORTIC ANEURYSM REPAIR: SHX799

## 2018-02-22 HISTORY — DX: Presence of aortocoronary bypass graft: Z95.1

## 2018-02-22 LAB — POCT I-STAT, CHEM 8
BUN: 31 mg/dL — ABNORMAL HIGH (ref 6–20)
BUN: 27 mg/dL — ABNORMAL HIGH (ref 6–20)
BUN: 27 mg/dL — ABNORMAL HIGH (ref 6–20)
BUN: 28 mg/dL — ABNORMAL HIGH (ref 6–20)
BUN: 29 mg/dL — ABNORMAL HIGH (ref 6–20)
BUN: 30 mg/dL — ABNORMAL HIGH (ref 6–20)
BUN: 28 mg/dL — ABNORMAL HIGH (ref 6–20)
BUN: 25 mg/dL — ABNORMAL HIGH (ref 6–20)
Calcium, Ion: 1.22 mmol/L (ref 1.15–1.40)
Calcium, Ion: 1.13 mmol/L — ABNORMAL LOW (ref 1.15–1.40)
Calcium, Ion: 1.02 mmol/L — ABNORMAL LOW (ref 1.15–1.40)
Calcium, Ion: 1.07 mmol/L — ABNORMAL LOW (ref 1.15–1.40)
Calcium, Ion: 1.1 mmol/L — ABNORMAL LOW (ref 1.15–1.40)
Calcium, Ion: 1.06 mmol/L — ABNORMAL LOW (ref 1.15–1.40)
Calcium, Ion: 1.14 mmol/L — ABNORMAL LOW (ref 1.15–1.40)
Calcium, Ion: 1.17 mmol/L (ref 1.15–1.40)
Chloride: 106 mmol/L (ref 101–111)
Chloride: 110 mmol/L (ref 101–111)
Chloride: 106 mmol/L (ref 101–111)
Chloride: 103 mmol/L (ref 101–111)
Chloride: 104 mmol/L (ref 101–111)
Chloride: 105 mmol/L (ref 101–111)
Chloride: 107 mmol/L (ref 101–111)
Chloride: 108 mmol/L (ref 101–111)
Creatinine, Ser: 1.7 mg/dL — ABNORMAL HIGH (ref 0.61–1.24)
Creatinine, Ser: 1.4 mg/dL — ABNORMAL HIGH (ref 0.61–1.24)
Creatinine, Ser: 1.4 mg/dL — ABNORMAL HIGH (ref 0.61–1.24)
Creatinine, Ser: 1.3 mg/dL — ABNORMAL HIGH (ref 0.61–1.24)
Creatinine, Ser: 1.3 mg/dL — ABNORMAL HIGH (ref 0.61–1.24)
Creatinine, Ser: 1.4 mg/dL — ABNORMAL HIGH (ref 0.61–1.24)
Creatinine, Ser: 1.3 mg/dL — ABNORMAL HIGH (ref 0.61–1.24)
Creatinine, Ser: 1.3 mg/dL — ABNORMAL HIGH (ref 0.61–1.24)
Glucose, Bld: 104 mg/dL — ABNORMAL HIGH (ref 65–99)
Glucose, Bld: 86 mg/dL (ref 65–99)
Glucose, Bld: 108 mg/dL — ABNORMAL HIGH (ref 65–99)
Glucose, Bld: 129 mg/dL — ABNORMAL HIGH (ref 65–99)
Glucose, Bld: 130 mg/dL — ABNORMAL HIGH (ref 65–99)
Glucose, Bld: 158 mg/dL — ABNORMAL HIGH (ref 65–99)
Glucose, Bld: 151 mg/dL — ABNORMAL HIGH (ref 65–99)
Glucose, Bld: 178 mg/dL — ABNORMAL HIGH (ref 65–99)
HCT: 32 % — ABNORMAL LOW (ref 39.0–52.0)
HCT: 25 % — ABNORMAL LOW (ref 39.0–52.0)
HCT: 30 % — ABNORMAL LOW (ref 39.0–52.0)
HCT: 24 % — ABNORMAL LOW (ref 39.0–52.0)
HCT: 24 % — ABNORMAL LOW (ref 39.0–52.0)
HCT: 25 % — ABNORMAL LOW (ref 39.0–52.0)
HCT: 20 % — ABNORMAL LOW (ref 39.0–52.0)
HCT: 24 % — ABNORMAL LOW (ref 39.0–52.0)
Hemoglobin: 10.9 g/dL — ABNORMAL LOW (ref 13.0–17.0)
Hemoglobin: 8.5 g/dL — ABNORMAL LOW (ref 13.0–17.0)
Hemoglobin: 10.2 g/dL — ABNORMAL LOW (ref 13.0–17.0)
Hemoglobin: 8.2 g/dL — ABNORMAL LOW (ref 13.0–17.0)
Hemoglobin: 8.2 g/dL — ABNORMAL LOW (ref 13.0–17.0)
Hemoglobin: 8.5 g/dL — ABNORMAL LOW (ref 13.0–17.0)
Hemoglobin: 6.8 g/dL — CL (ref 13.0–17.0)
Hemoglobin: 8.2 g/dL — ABNORMAL LOW (ref 13.0–17.0)
Potassium: 4.5 mmol/L (ref 3.5–5.1)
Potassium: 4.2 mmol/L (ref 3.5–5.1)
Potassium: 4.5 mmol/L (ref 3.5–5.1)
Potassium: 4.7 mmol/L (ref 3.5–5.1)
Potassium: 4.7 mmol/L (ref 3.5–5.1)
Potassium: 5.6 mmol/L — ABNORMAL HIGH (ref 3.5–5.1)
Potassium: 4.5 mmol/L (ref 3.5–5.1)
Potassium: 4.5 mmol/L (ref 3.5–5.1)
Sodium: 142 mmol/L (ref 135–145)
Sodium: 143 mmol/L (ref 135–145)
Sodium: 142 mmol/L (ref 135–145)
Sodium: 138 mmol/L (ref 135–145)
Sodium: 140 mmol/L (ref 135–145)
Sodium: 137 mmol/L (ref 135–145)
Sodium: 139 mmol/L (ref 135–145)
Sodium: 141 mmol/L (ref 135–145)
TCO2: 22 mmol/L (ref 22–32)
TCO2: 21 mmol/L — ABNORMAL LOW (ref 22–32)
TCO2: 24 mmol/L (ref 22–32)
TCO2: 25 mmol/L (ref 22–32)
TCO2: 25 mmol/L (ref 22–32)
TCO2: 24 mmol/L (ref 22–32)
TCO2: 22 mmol/L (ref 22–32)
TCO2: 20 mmol/L — ABNORMAL LOW (ref 22–32)

## 2018-02-22 LAB — POCT I-STAT 3, ART BLOOD GAS (G3+)
Acid-base deficit: 2 mmol/L (ref 0.0–2.0)
Acid-base deficit: 5 mmol/L — ABNORMAL HIGH (ref 0.0–2.0)
Acid-base deficit: 7 mmol/L — ABNORMAL HIGH (ref 0.0–2.0)
Acid-base deficit: 6 mmol/L — ABNORMAL HIGH (ref 0.0–2.0)
Bicarbonate: 22.7 mmol/L (ref 20.0–28.0)
Bicarbonate: 19.8 mmol/L — ABNORMAL LOW (ref 20.0–28.0)
Bicarbonate: 18.3 mmol/L — ABNORMAL LOW (ref 20.0–28.0)
Bicarbonate: 19.7 mmol/L — ABNORMAL LOW (ref 20.0–28.0)
O2 Saturation: 100 %
O2 Saturation: 100 %
O2 Saturation: 97 %
O2 Saturation: 89 %
Patient temperature: 34.1
Patient temperature: 36.5
TCO2: 24 mmol/L (ref 22–32)
TCO2: 21 mmol/L — ABNORMAL LOW (ref 22–32)
TCO2: 19 mmol/L — ABNORMAL LOW (ref 22–32)
TCO2: 21 mmol/L — ABNORMAL LOW (ref 22–32)
pCO2 arterial: 38.7 mmHg (ref 32.0–48.0)
pCO2 arterial: 33.9 mmHg (ref 32.0–48.0)
pCO2 arterial: 32.5 mmHg (ref 32.0–48.0)
pCO2 arterial: 36.2 mmHg (ref 32.0–48.0)
pH, Arterial: 7.376 (ref 7.350–7.450)
pH, Arterial: 7.374 (ref 7.350–7.450)
pH, Arterial: 7.343 — ABNORMAL LOW (ref 7.350–7.450)
pH, Arterial: 7.34 — ABNORMAL LOW (ref 7.350–7.450)
pO2, Arterial: 413 mmHg — ABNORMAL HIGH (ref 83.0–108.0)
pO2, Arterial: 225 mmHg — ABNORMAL HIGH (ref 83.0–108.0)
pO2, Arterial: 84 mmHg (ref 83.0–108.0)
pO2, Arterial: 57 mmHg — ABNORMAL LOW (ref 83.0–108.0)

## 2018-02-22 LAB — CBC
HCT: 29.8 % — ABNORMAL LOW (ref 39.0–52.0)
HCT: 26.5 % — ABNORMAL LOW (ref 39.0–52.0)
Hemoglobin: 10.2 g/dL — ABNORMAL LOW (ref 13.0–17.0)
Hemoglobin: 9.4 g/dL — ABNORMAL LOW (ref 13.0–17.0)
MCH: 31.8 pg (ref 26.0–34.0)
MCH: 32.8 pg (ref 26.0–34.0)
MCHC: 34.2 g/dL (ref 30.0–36.0)
MCHC: 35.5 g/dL (ref 30.0–36.0)
MCV: 92.8 fL (ref 78.0–100.0)
MCV: 92.3 fL (ref 78.0–100.0)
Platelets: 146 10*3/uL — ABNORMAL LOW (ref 150–400)
Platelets: 131 10*3/uL — ABNORMAL LOW (ref 150–400)
RBC: 3.21 MIL/uL — ABNORMAL LOW (ref 4.22–5.81)
RBC: 2.87 MIL/uL — ABNORMAL LOW (ref 4.22–5.81)
RDW: 13 % (ref 11.5–15.5)
RDW: 13.8 % (ref 11.5–15.5)
WBC: 8.7 10*3/uL (ref 4.0–10.5)
WBC: 8.6 10*3/uL (ref 4.0–10.5)

## 2018-02-22 LAB — PLATELET COUNT: Platelets: 72 10*3/uL — ABNORMAL LOW (ref 150–400)

## 2018-02-22 LAB — GLUCOSE, CAPILLARY
Glucose-Capillary: 113 mg/dL — ABNORMAL HIGH (ref 65–99)
Glucose-Capillary: 106 mg/dL — ABNORMAL HIGH (ref 65–99)
Glucose-Capillary: 107 mg/dL — ABNORMAL HIGH (ref 65–99)

## 2018-02-22 LAB — POCT I-STAT 4, (NA,K, GLUC, HGB,HCT)
Glucose, Bld: 107 mg/dL — ABNORMAL HIGH (ref 65–99)
HCT: 28 % — ABNORMAL LOW (ref 39.0–52.0)
Hemoglobin: 9.5 g/dL — ABNORMAL LOW (ref 13.0–17.0)
Potassium: 4 mmol/L (ref 3.5–5.1)
Sodium: 143 mmol/L (ref 135–145)

## 2018-02-22 LAB — HEMOGLOBIN AND HEMATOCRIT, BLOOD
HCT: 22.7 % — ABNORMAL LOW (ref 39.0–52.0)
Hemoglobin: 7.8 g/dL — ABNORMAL LOW (ref 13.0–17.0)

## 2018-02-22 LAB — PREPARE RBC (CROSSMATCH)

## 2018-02-22 LAB — APTT: aPTT: 40 seconds — ABNORMAL HIGH (ref 24–36)

## 2018-02-22 LAB — PROTIME-INR
INR: 1.51
Prothrombin Time: 18.1 seconds — ABNORMAL HIGH (ref 11.4–15.2)

## 2018-02-22 SURGERY — REPAIR, AORTIC VALVE
Anesthesia: General | Site: Chest

## 2018-02-22 MED ORDER — PANTOPRAZOLE SODIUM 40 MG PO TBEC
40.0000 mg | DELAYED_RELEASE_TABLET | Freq: Every day | ORAL | Status: DC
  Administered 2018-02-24 – 2018-03-03 (×8): 40 mg via ORAL
  Filled 2018-02-22 (×8): qty 1

## 2018-02-22 MED ORDER — LACTATED RINGERS IV SOLN
INTRAVENOUS | Status: DC | PRN
  Administered 2018-02-22 (×2): via INTRAVENOUS

## 2018-02-22 MED ORDER — DEXTROSE 50 % IV SOLN
INTRAVENOUS | Status: DC | PRN
  Administered 2018-02-22: 14 mL via INTRAVENOUS

## 2018-02-22 MED ORDER — SODIUM CHLORIDE 0.9 % IJ SOLN
OROMUCOSAL | Status: DC | PRN
  Administered 2018-02-22 (×3): 4 mL via TOPICAL

## 2018-02-22 MED ORDER — CHLORHEXIDINE GLUCONATE 4 % EX LIQD: 30.0000 mL | CUTANEOUS | Status: DC

## 2018-02-22 MED ORDER — OXYCODONE HCL 5 MG PO TABS
5.0000 mg | ORAL_TABLET | ORAL | Status: DC | PRN
  Administered 2018-02-24 (×2): 10 mg via ORAL
  Filled 2018-02-22 (×2): qty 2

## 2018-02-22 MED ORDER — ASPIRIN EC 325 MG PO TBEC
325.0000 mg | DELAYED_RELEASE_TABLET | Freq: Every day | ORAL | Status: DC
  Administered 2018-02-24 – 2018-02-25 (×2): 325 mg via ORAL
  Filled 2018-02-22 (×2): qty 1

## 2018-02-22 MED ORDER — VANCOMYCIN HCL 1000 MG IV SOLR
INTRAVENOUS | Status: DC | PRN
  Administered 2018-02-22: 1000 mL

## 2018-02-22 MED ORDER — DOPAMINE-DEXTROSE 3.2-5 MG/ML-% IV SOLN: 0.0000 ug/kg/min | INTRAVENOUS | Status: DC

## 2018-02-22 MED ORDER — ACETAMINOPHEN 500 MG PO TABS
1000.0000 mg | ORAL_TABLET | Freq: Four times a day (QID) | ORAL | Status: AC
  Administered 2018-02-23 – 2018-02-27 (×15): 1000 mg via ORAL
  Filled 2018-02-22 (×16): qty 2

## 2018-02-22 MED ORDER — HEMOSTATIC AGENTS (NO CHARGE) OPTIME
TOPICAL | Status: DC | PRN
  Administered 2018-02-22: 1 via TOPICAL

## 2018-02-22 MED ORDER — ROCURONIUM BROMIDE 10 MG/ML (PF) SYRINGE
PREFILLED_SYRINGE | INTRAVENOUS | Status: AC
  Filled 2018-02-22: qty 25

## 2018-02-22 MED ORDER — METOPROLOL TARTRATE 5 MG/5ML IV SOLN
2.5000 mg | INTRAVENOUS | Status: DC | PRN
  Administered 2018-02-28: 2.5 mg via INTRAVENOUS
  Filled 2018-02-22 (×2): qty 5

## 2018-02-22 MED ORDER — NITROGLYCERIN IN D5W 200-5 MCG/ML-% IV SOLN: 0.0000 ug/min | INTRAVENOUS | Status: DC

## 2018-02-22 MED ORDER — ALBUMIN HUMAN 5 % IV SOLN
250.0000 mL | INTRAVENOUS | Status: AC | PRN
  Administered 2018-02-22 – 2018-02-23 (×4): 250 mL via INTRAVENOUS
  Filled 2018-02-22 (×2): qty 250

## 2018-02-22 MED ORDER — SODIUM CHLORIDE 0.9 % IV SOLN
INTRAVENOUS | Status: DC
  Administered 2018-02-22: 17:00:00 via INTRAVENOUS

## 2018-02-22 MED ORDER — MIDAZOLAM HCL 10 MG/2ML IJ SOLN
INTRAMUSCULAR | Status: AC
  Filled 2018-02-22: qty 2

## 2018-02-22 MED ORDER — BISACODYL 10 MG RE SUPP
10.0000 mg | Freq: Every day | RECTAL | Status: DC
  Filled 2018-02-22: qty 1

## 2018-02-22 MED ORDER — SODIUM CHLORIDE 0.9% FLUSH
3.0000 mL | Freq: Two times a day (BID) | INTRAVENOUS | Status: DC
  Administered 2018-02-23 – 2018-03-03 (×14): 3 mL via INTRAVENOUS

## 2018-02-22 MED ORDER — PROTAMINE SULFATE 10 MG/ML IV SOLN
INTRAVENOUS | Status: AC
  Filled 2018-02-22: qty 25

## 2018-02-22 MED ORDER — PROTAMINE SULFATE 10 MG/ML IV SOLN
INTRAVENOUS | Status: AC
  Filled 2018-02-22: qty 5

## 2018-02-22 MED ORDER — PROPOFOL 10 MG/ML IV BOLUS
INTRAVENOUS | Status: DC | PRN
  Administered 2018-02-22: 100 mg via INTRAVENOUS

## 2018-02-22 MED ORDER — HEPARIN SODIUM (PORCINE) 1000 UNIT/ML IJ SOLN
INTRAMUSCULAR | Status: AC
  Filled 2018-02-22: qty 1

## 2018-02-22 MED ORDER — BISACODYL 5 MG PO TBEC
10.0000 mg | DELAYED_RELEASE_TABLET | Freq: Every day | ORAL | Status: DC
  Administered 2018-02-24 – 2018-02-26 (×3): 10 mg via ORAL
  Filled 2018-02-22 (×3): qty 2

## 2018-02-22 MED ORDER — SODIUM CHLORIDE 0.9 % IV SOLN
INTRAVENOUS | Status: DC | PRN
  Administered 2018-02-22: .9 [IU]/h via INTRAVENOUS

## 2018-02-22 MED ORDER — MIDAZOLAM HCL 5 MG/5ML IJ SOLN
INTRAMUSCULAR | Status: DC | PRN
  Administered 2018-02-22: 2 mg via INTRAVENOUS
  Administered 2018-02-22 (×2): 1 mg via INTRAVENOUS

## 2018-02-22 MED ORDER — ORAL CARE MOUTH RINSE
15.0000 mL | Freq: Four times a day (QID) | OROMUCOSAL | Status: DC
  Administered 2018-02-22 – 2018-02-23 (×3): 15 mL via OROMUCOSAL

## 2018-02-22 MED ORDER — TRAMADOL HCL 50 MG PO TABS
50.0000 mg | ORAL_TABLET | ORAL | Status: DC | PRN
  Administered 2018-02-23: 50 mg via ORAL
  Administered 2018-02-24 (×2): 100 mg via ORAL
  Filled 2018-02-22: qty 2
  Filled 2018-02-22: qty 1
  Filled 2018-02-22: qty 2

## 2018-02-22 MED ORDER — CEFUROXIME SODIUM 750 MG IJ SOLR
INTRAMUSCULAR | Status: DC | PRN
  Administered 2018-02-22: 750 mg via INTRAVENOUS

## 2018-02-22 MED ORDER — MIDAZOLAM HCL 2 MG/2ML IJ SOLN
2.0000 mg | INTRAMUSCULAR | Status: DC | PRN
  Administered 2018-02-22 – 2018-02-23 (×3): 2 mg via INTRAVENOUS
  Filled 2018-02-22 (×3): qty 2

## 2018-02-22 MED ORDER — ALBUMIN HUMAN 5 % IV SOLN
INTRAVENOUS | Status: DC | PRN
  Administered 2018-02-22: 15:00:00 via INTRAVENOUS

## 2018-02-22 MED ORDER — CHLORHEXIDINE GLUCONATE 0.12% ORAL RINSE (MEDLINE KIT)
15.0000 mL | Freq: Two times a day (BID) | OROMUCOSAL | Status: DC
  Administered 2018-02-22 – 2018-02-23 (×2): 15 mL via OROMUCOSAL

## 2018-02-22 MED ORDER — PHENYLEPHRINE HCL 10 MG/ML IJ SOLN
INTRAVENOUS | Status: DC | PRN
  Administered 2018-02-22: 15 ug/min via INTRAVENOUS

## 2018-02-22 MED ORDER — LACTATED RINGERS IV SOLN: INTRAVENOUS | Status: DC

## 2018-02-22 MED ORDER — FAMOTIDINE IN NACL 20-0.9 MG/50ML-% IV SOLN
20.0000 mg | Freq: Two times a day (BID) | INTRAVENOUS | Status: AC
  Administered 2018-02-22 – 2018-02-23 (×2): 20 mg via INTRAVENOUS
  Filled 2018-02-22 (×2): qty 50

## 2018-02-22 MED ORDER — ROCURONIUM BROMIDE 10 MG/ML (PF) SYRINGE
PREFILLED_SYRINGE | INTRAVENOUS | Status: AC
  Filled 2018-02-22: qty 5

## 2018-02-22 MED ORDER — PHENYLEPHRINE 40 MCG/ML (10ML) SYRINGE FOR IV PUSH (FOR BLOOD PRESSURE SUPPORT)
PREFILLED_SYRINGE | INTRAVENOUS | Status: AC
  Filled 2018-02-22: qty 10

## 2018-02-22 MED ORDER — ASPIRIN 81 MG PO CHEW: 324.0000 mg | CHEWABLE_TABLET | Freq: Every day | ORAL | Status: DC

## 2018-02-22 MED ORDER — ONDANSETRON HCL 4 MG/2ML IJ SOLN
4.0000 mg | Freq: Four times a day (QID) | INTRAMUSCULAR | Status: DC | PRN
  Administered 2018-02-23: 4 mg via INTRAVENOUS
  Filled 2018-02-22: qty 2

## 2018-02-22 MED ORDER — INSULIN REGULAR BOLUS VIA INFUSION
0.0000 [IU] | Freq: Three times a day (TID) | INTRAVENOUS | Status: DC
  Filled 2018-02-22: qty 10

## 2018-02-22 MED ORDER — ARTIFICIAL TEARS OPHTHALMIC OINT
TOPICAL_OINTMENT | OPHTHALMIC | Status: DC | PRN
  Administered 2018-02-22: 1 via OPHTHALMIC

## 2018-02-22 MED ORDER — SODIUM CHLORIDE 0.9 % IV SOLN: Freq: Once | INTRAVENOUS | Status: DC

## 2018-02-22 MED ORDER — PHENYLEPHRINE HCL 10 MG/ML IJ SOLN
INTRAMUSCULAR | Status: DC | PRN
  Administered 2018-02-22 (×4): 200 ug via INTRAVENOUS

## 2018-02-22 MED ORDER — PROPOFOL 10 MG/ML IV BOLUS
INTRAVENOUS | Status: AC
  Filled 2018-02-22: qty 20

## 2018-02-22 MED ORDER — CALCIUM CHLORIDE 10 % IV SOLN
INTRAVENOUS | Status: DC | PRN
  Administered 2018-02-22 (×2): 300 mg via INTRAVENOUS
  Administered 2018-02-22 (×2): 200 mg via INTRAVENOUS

## 2018-02-22 MED ORDER — SODIUM CHLORIDE 0.45 % IV SOLN
INTRAVENOUS | Status: DC | PRN
  Administered 2018-02-22: 17:00:00 via INTRAVENOUS

## 2018-02-22 MED ORDER — SODIUM CHLORIDE 0.9% FLUSH: 3.0000 mL | INTRAVENOUS | Status: DC | PRN

## 2018-02-22 MED ORDER — LACTATED RINGERS IV SOLN
INTRAVENOUS | Status: DC | PRN
  Administered 2018-02-22: 08:00:00 via INTRAVENOUS

## 2018-02-22 MED ORDER — ACETAMINOPHEN 160 MG/5ML PO SOLN
1000.0000 mg | Freq: Four times a day (QID) | ORAL | Status: DC
  Administered 2018-02-22 – 2018-02-23 (×2): 1000 mg
  Filled 2018-02-22 (×2): qty 40.6

## 2018-02-22 MED ORDER — HEPARIN SODIUM (PORCINE) 1000 UNIT/ML IJ SOLN
INTRAMUSCULAR | Status: DC | PRN
  Administered 2018-02-22: 29 mL via INTRAVENOUS

## 2018-02-22 MED ORDER — SODIUM CHLORIDE 0.9 % IV SOLN
INTRAVENOUS | Status: DC | PRN
  Administered 2018-02-22: .5 ug/kg/h via INTRAVENOUS

## 2018-02-22 MED ORDER — SODIUM CHLORIDE 0.9 % IV SOLN
250.0000 mL | INTRAVENOUS | Status: DC
  Administered 2018-02-25: 250 mL via INTRAVENOUS

## 2018-02-22 MED ORDER — ROCURONIUM BROMIDE 100 MG/10ML IV SOLN
INTRAVENOUS | Status: DC | PRN
  Administered 2018-02-22 (×3): 50 mg via INTRAVENOUS
  Administered 2018-02-22: 100 mg via INTRAVENOUS

## 2018-02-22 MED ORDER — CALCIUM CHLORIDE 10 % IV SOLN
INTRAVENOUS | Status: AC
  Filled 2018-02-22: qty 10

## 2018-02-22 MED ORDER — FENTANYL CITRATE (PF) 250 MCG/5ML IJ SOLN
INTRAMUSCULAR | Status: AC
  Filled 2018-02-22: qty 20

## 2018-02-22 MED ORDER — SODIUM CHLORIDE 0.9 % IV SOLN
0.0000 ug/min | INTRAVENOUS | Status: DC
  Administered 2018-02-22: 40 ug/min via INTRAVENOUS
  Filled 2018-02-22: qty 20
  Filled 2018-02-22: qty 2

## 2018-02-22 MED ORDER — SODIUM CHLORIDE 0.9 % IV SOLN
0.0000 ug/kg/h | INTRAVENOUS | Status: DC
  Administered 2018-02-22 – 2018-02-23 (×2): 0.4 ug/kg/h via INTRAVENOUS
  Administered 2018-02-23: 0.1 ug/kg/h via INTRAVENOUS
  Filled 2018-02-22 (×3): qty 2

## 2018-02-22 MED ORDER — DOCUSATE SODIUM 100 MG PO CAPS
200.0000 mg | ORAL_CAPSULE | Freq: Every day | ORAL | Status: DC
  Administered 2018-02-24 – 2018-02-26 (×3): 200 mg via ORAL
  Filled 2018-02-22 (×5): qty 2

## 2018-02-22 MED ORDER — ACETAMINOPHEN 650 MG RE SUPP
650.0000 mg | Freq: Once | RECTAL | Status: AC
  Administered 2018-02-22: 650 mg via RECTAL

## 2018-02-22 MED ORDER — INSULIN ASPART 100 UNIT/ML ~~LOC~~ SOLN
0.0000 [IU] | SUBCUTANEOUS | Status: DC
  Administered 2018-02-23 (×2): 2 [IU] via SUBCUTANEOUS

## 2018-02-22 MED ORDER — SODIUM CHLORIDE 0.9 % IV SOLN: INTRAVENOUS | Status: DC

## 2018-02-22 MED ORDER — MORPHINE SULFATE (PF) 2 MG/ML IV SOLN
1.0000 mg | INTRAVENOUS | Status: DC | PRN
  Administered 2018-02-23: 1 mg via INTRAVENOUS
  Administered 2018-02-23 – 2018-02-24 (×7): 2 mg via INTRAVENOUS
  Filled 2018-02-22 (×7): qty 1

## 2018-02-22 MED ORDER — MORPHINE SULFATE (PF) 2 MG/ML IV SOLN
1.0000 mg | INTRAVENOUS | Status: DC | PRN
  Administered 2018-02-22: 2 mg via INTRAVENOUS
  Filled 2018-02-22: qty 2

## 2018-02-22 MED ORDER — POTASSIUM CHLORIDE 10 MEQ/50ML IV SOLN: 10.0000 meq | INTRAVENOUS | Status: AC

## 2018-02-22 MED ORDER — SODIUM CHLORIDE 0.9 % IR SOLN
Status: DC | PRN
  Administered 2018-02-22: 6000 mL

## 2018-02-22 MED ORDER — PROTAMINE SULFATE 10 MG/ML IV SOLN
INTRAVENOUS | Status: DC | PRN
  Administered 2018-02-22: 50 mg via INTRAVENOUS
  Administered 2018-02-22: 30 mg via INTRAVENOUS
  Administered 2018-02-22 (×2): 50 mg via INTRAVENOUS
  Administered 2018-02-22: 25 mg via INTRAVENOUS
  Administered 2018-02-22: 50 mg via INTRAVENOUS
  Administered 2018-02-22: 35 mg via INTRAVENOUS

## 2018-02-22 MED ORDER — SODIUM CHLORIDE 0.9 % IV SOLN
INTRAVENOUS | Status: DC | PRN
  Administered 2018-02-22 (×3): via INTRAVENOUS

## 2018-02-22 MED ORDER — SODIUM CHLORIDE 0.9 % IV SOLN
INTRAVENOUS | Status: DC
  Filled 2018-02-22: qty 1

## 2018-02-22 MED ORDER — LACTATED RINGERS IV SOLN: 500.0000 mL | Freq: Once | INTRAVENOUS | Status: DC | PRN

## 2018-02-22 MED ORDER — VANCOMYCIN HCL IN DEXTROSE 1-5 GM/200ML-% IV SOLN
1000.0000 mg | Freq: Once | INTRAVENOUS | Status: AC
  Administered 2018-02-22: 1000 mg via INTRAVENOUS
  Filled 2018-02-22: qty 200

## 2018-02-22 MED ORDER — MAGNESIUM SULFATE 4 GM/100ML IV SOLN
4.0000 g | Freq: Once | INTRAVENOUS | Status: AC
  Administered 2018-02-22: 4 g via INTRAVENOUS
  Filled 2018-02-22: qty 100

## 2018-02-22 MED ORDER — ARTIFICIAL TEARS OPHTHALMIC OINT
TOPICAL_OINTMENT | OPHTHALMIC | Status: AC
Start: 2018-02-22 — End: ?
  Filled 2018-02-22: qty 3.5

## 2018-02-22 MED ORDER — SODIUM BICARBONATE 8.4 % IV SOLN
50.0000 meq | Freq: Once | INTRAVENOUS | Status: AC
  Administered 2018-02-22: 50 meq via INTRAVENOUS

## 2018-02-22 MED ORDER — CEFAZOLIN SODIUM-DEXTROSE 2-4 GM/100ML-% IV SOLN
2.0000 g | Freq: Three times a day (TID) | INTRAVENOUS | Status: AC
  Administered 2018-02-22 – 2018-02-24 (×6): 2 g via INTRAVENOUS
  Filled 2018-02-22 (×6): qty 100

## 2018-02-22 MED ORDER — ACETAMINOPHEN 160 MG/5ML PO SOLN: 650.0000 mg | Freq: Once | ORAL | Status: AC

## 2018-02-22 MED ORDER — EPHEDRINE 5 MG/ML INJ
INTRAVENOUS | Status: AC
  Filled 2018-02-22: qty 10

## 2018-02-22 MED ORDER — FENTANYL CITRATE (PF) 250 MCG/5ML IJ SOLN
INTRAMUSCULAR | Status: DC | PRN
  Administered 2018-02-22: 750 ug via INTRAVENOUS
  Administered 2018-02-22: 100 ug via INTRAVENOUS
  Administered 2018-02-22: 50 ug via INTRAVENOUS
  Administered 2018-02-22: 100 ug via INTRAVENOUS

## 2018-02-22 MED ORDER — CHLORHEXIDINE GLUCONATE 0.12 % MT SOLN
15.0000 mL | OROMUCOSAL | Status: AC
  Administered 2018-02-22: 15 mL via OROMUCOSAL

## 2018-02-22 MED ORDER — LIDOCAINE HCL (CARDIAC) 20 MG/ML IV SOLN
INTRAVENOUS | Status: AC
  Filled 2018-02-22: qty 5

## 2018-02-22 SURGICAL SUPPLY — 182 items
ADAPTER CARDIO PERF ANTE/RETRO (ADAPTER) ×5 IMPLANT
ADH SKN CLS APL DERMABOND .7 (GAUZE/BANDAGES/DRESSINGS) ×4
ADH SRG 12 PREFL SYR 3 SPRDR (MISCELLANEOUS) ×2
ADPR PRFSN 84XANTGRD RTRGD (ADAPTER) ×2
APL SRG 7X2 LUM MLBL SLNT (VASCULAR PRODUCTS) ×2
APPLICATOR TIP COSEAL (VASCULAR PRODUCTS) ×1 IMPLANT
BAG DECANTER FOR FLEXI CONT (MISCELLANEOUS) ×8 IMPLANT
BANDAGE ACE 4X5 VEL STRL LF (GAUZE/BANDAGES/DRESSINGS) ×3 IMPLANT
BANDAGE ACE 6X5 VEL STRL LF (GAUZE/BANDAGES/DRESSINGS) ×3 IMPLANT
BASKET HEART (ORDER IN 25'S) (MISCELLANEOUS) ×1
BASKET HEART (ORDER IN 25S) (MISCELLANEOUS) ×2 IMPLANT
BLADE CLIPPER SURG (BLADE) IMPLANT
BLADE STERNUM SYSTEM 6 (BLADE) ×6 IMPLANT
BLADE SURG 11 STRL SS (BLADE) ×6 IMPLANT
BLADE SURG 15 STRL LF DISP TIS (BLADE) IMPLANT
BLADE SURG 15 STRL SS (BLADE)
BNDG GAUZE ELAST 4 BULKY (GAUZE/BANDAGES/DRESSINGS) ×3 IMPLANT
CANISTER SUCT 3000ML PPV (MISCELLANEOUS) ×6 IMPLANT
CANN PRFSN .5XCNCT 15X34-48 (MISCELLANEOUS)
CANNULA ARTERIAL 007325 (MISCELLANEOUS) IMPLANT
CANNULA ARTERIAL 14F 007324 (MISCELLANEOUS) IMPLANT
CANNULA ARTERIAL 18F 007308 (MISCELLANEOUS) IMPLANT
CANNULA ARTERIAL 20F L7309 (MISCELLANEOUS) IMPLANT
CANNULA ARTERIAL 22F 007310 (MISCELLANEOUS) IMPLANT
CANNULA ARTERIAL 24F 007311 (MISCELLANEOUS) IMPLANT
CANNULA EZ GLIDE AORTIC 21FR (CANNULA) ×5 IMPLANT
CANNULA GRAFT 8MMX50CM (Graft) ×1 IMPLANT
CANNULA GUNDRY RCSP 15FR (MISCELLANEOUS) ×6 IMPLANT
CANNULA PRFSN .5XCNCT 15X34-48 (MISCELLANEOUS) IMPLANT
CANNULA SOFTFLOW AORTIC 7M21FR (CANNULA) ×3 IMPLANT
CANNULA VEN 2 STAGE (MISCELLANEOUS)
CATH CPB KIT OWEN (MISCELLANEOUS) ×3 IMPLANT
CATH HEART VENT LEFT (CATHETERS) ×4 IMPLANT
CATH THORACIC 28FR (CATHETERS) IMPLANT
CATH THORACIC 36FR (CATHETERS) ×3 IMPLANT
CATH THORACIC 36FR RT ANG (CATHETERS) ×2 IMPLANT
CAUTERY SURG HI TEMP FINE TIP (MISCELLANEOUS) ×1 IMPLANT
CLIP RETRACTION 3.0MM CORONARY (MISCELLANEOUS) ×1 IMPLANT
CLIP VESOCCLUDE MED 24/CT (CLIP) IMPLANT
CLIP VESOCCLUDE MED 6/CT (CLIP) IMPLANT
CLIP VESOCCLUDE SM WIDE 24/CT (CLIP) IMPLANT
CLIP VESOCCLUDE SM WIDE 6/CT (CLIP) IMPLANT
CONN 1/2X1/2X1/2  BEN (MISCELLANEOUS)
CONN 1/2X1/2X1/2 BEN (MISCELLANEOUS) IMPLANT
CONN 3/8X3/8 GISH STERILE (MISCELLANEOUS) IMPLANT
CONN ST 1/4X3/8  BEN (MISCELLANEOUS) ×1
CONN ST 1/4X3/8 BEN (MISCELLANEOUS) IMPLANT
CONN Y 3/8X3/8X3/8  BEN (MISCELLANEOUS)
CONN Y 3/8X3/8X3/8 BEN (MISCELLANEOUS) IMPLANT
CONT SPEC 4OZ CLIKSEAL STRL BL (MISCELLANEOUS) ×3 IMPLANT
COVER SURGICAL LIGHT HANDLE (MISCELLANEOUS) ×3 IMPLANT
CRADLE DONUT ADULT HEAD (MISCELLANEOUS) ×3 IMPLANT
DERMABOND ADVANCED (GAUZE/BANDAGES/DRESSINGS) ×2
DERMABOND ADVANCED .7 DNX12 (GAUZE/BANDAGES/DRESSINGS) IMPLANT
DRAIN CHANNEL 32F RND 10.7 FF (WOUND CARE) ×6 IMPLANT
DRAPE BILATERAL SPLIT (DRAPES) IMPLANT
DRAPE CARDIOVASCULAR INCISE (DRAPES) ×3
DRAPE CV SPLIT W-CLR ANES SCRN (DRAPES) IMPLANT
DRAPE INCISE IOBAN 66X45 STRL (DRAPES) ×6 IMPLANT
DRAPE SLUSH MACHINE 52X66 (DRAPES) ×1 IMPLANT
DRAPE SLUSH/WARMER DISC (DRAPES) ×3 IMPLANT
DRAPE SRG 135X102X78XABS (DRAPES) ×2 IMPLANT
DRSG AQUACEL AG ADV 3.5X14 (GAUZE/BANDAGES/DRESSINGS) ×1 IMPLANT
DRSG COVADERM 4X14 (GAUZE/BANDAGES/DRESSINGS) ×6 IMPLANT
ELECT BLADE 4.0 EZ CLEAN MEGAD (MISCELLANEOUS) ×3
ELECT REM PT RETURN 9FT ADLT (ELECTROSURGICAL) ×6
ELECTRODE BLDE 4.0 EZ CLN MEGD (MISCELLANEOUS) ×2 IMPLANT
ELECTRODE REM PT RTRN 9FT ADLT (ELECTROSURGICAL) ×8 IMPLANT
FELT TEFLON 1X6 (MISCELLANEOUS) ×6 IMPLANT
FELT TEFLON 6X6 (MISCELLANEOUS) IMPLANT
GAUZE SPONGE 4X4 12PLY STRL (GAUZE/BANDAGES/DRESSINGS) ×10 IMPLANT
GLOVE BIO SURGEON STRL SZ 6 (GLOVE) IMPLANT
GLOVE BIO SURGEON STRL SZ 6.5 (GLOVE) ×1 IMPLANT
GLOVE BIO SURGEON STRL SZ7 (GLOVE) IMPLANT
GLOVE BIO SURGEON STRL SZ7.5 (GLOVE) ×2 IMPLANT
GLOVE BIOGEL PI IND STRL 6 (GLOVE) IMPLANT
GLOVE BIOGEL PI INDICATOR 6 (GLOVE) ×1
GLOVE ECLIPSE 8.0 STRL XLNG CF (GLOVE) ×3 IMPLANT
GLOVE ORTHO TXT STRL SZ7.5 (GLOVE) ×9 IMPLANT
GOWN STRL REUS W/ TWL LRG LVL3 (GOWN DISPOSABLE) ×16 IMPLANT
GOWN STRL REUS W/TWL LRG LVL3 (GOWN DISPOSABLE) ×39
GRAFT GELWEAVE VALSALVA 24 (Prosthesis & Implant Heart) IMPLANT
GRAFT GELWEAVE VALSALVA 24CM (Prosthesis & Implant Heart) ×3 IMPLANT
HEMOSTAT POWDER SURGIFOAM 1G (HEMOSTASIS) ×13 IMPLANT
INSERT FOGARTY SM (MISCELLANEOUS) IMPLANT
INSERT FOGARTY XLG (MISCELLANEOUS) ×6 IMPLANT
KIT BASIN OR (CUSTOM PROCEDURE TRAY) ×6 IMPLANT
KIT DRAINAGE VACCUM ASSIST (KITS) ×1 IMPLANT
KIT ROOM TURNOVER OR (KITS) ×5 IMPLANT
KIT SUCTION CATH 14FR (SUCTIONS) ×12 IMPLANT
KIT VASOVIEW HEMOPRO VH 3000 (KITS) ×3 IMPLANT
LEAD PACING MYOCARDI (MISCELLANEOUS) ×6 IMPLANT
LINE VENT (MISCELLANEOUS) ×1 IMPLANT
LOOP VESSEL SUPERMAXI WHITE (MISCELLANEOUS) ×2 IMPLANT
MARKER GRAFT CORONARY BYPASS (MISCELLANEOUS) ×9 IMPLANT
NEEDLE AORTIC AIR ASPIRATING (NEEDLE) IMPLANT
NS IRRIG 1000ML POUR BTL (IV SOLUTION) ×26 IMPLANT
PACK E OPEN HEART (SUTURE) ×3 IMPLANT
PACK OPEN HEART (CUSTOM PROCEDURE TRAY) ×5 IMPLANT
PAD ARMBOARD 7.5X6 YLW CONV (MISCELLANEOUS) ×10 IMPLANT
PAD CARDIAC INSULATION (MISCELLANEOUS) ×1 IMPLANT
PAD ELECT DEFIB RADIOL ZOLL (MISCELLANEOUS) ×3 IMPLANT
PENCIL BUTTON HOLSTER BLD 10FT (ELECTRODE) ×3 IMPLANT
POWDER SURGICEL 3.0 GRAM (HEMOSTASIS) ×1 IMPLANT
PUNCH AORTIC ROTATE  4.5MM 8IN (MISCELLANEOUS) ×1 IMPLANT
PUNCH AORTIC ROTATE 4.0MM (MISCELLANEOUS) IMPLANT
PUNCH AORTIC ROTATE 4.5MM 8IN (MISCELLANEOUS) IMPLANT
PUNCH AORTIC ROTATE 5MM 8IN (MISCELLANEOUS) IMPLANT
RELOAD TRI 2.0 30 VAS MED SUL (STAPLE) ×1 IMPLANT
SEALANT SURG COSEAL 8ML (VASCULAR PRODUCTS) ×1 IMPLANT
SET CARDIOPLEGIA MPS 5001102 (MISCELLANEOUS) ×1 IMPLANT
SET IRRIG TUBING LAPAROSCOPIC (IRRIGATION / IRRIGATOR) ×3 IMPLANT
SOLUTION ANTI FOG 6CC (MISCELLANEOUS) ×1 IMPLANT
SPONGE LAP 18X18 X RAY DECT (DISPOSABLE) ×2 IMPLANT
SPONGE LAP 4X18 X RAY DECT (DISPOSABLE) ×1 IMPLANT
STAPLER ENDO GIA 12 SHRT THIN (STAPLE) IMPLANT
STAPLER ENDO GIA 12MM SHORT (STAPLE) ×3 IMPLANT
STOPCOCK 4 WAY LG BORE MALE ST (IV SETS) IMPLANT
SUT BONE WAX W31G (SUTURE) ×3 IMPLANT
SUT ETHIBON 2 0 V 52N 30 (SUTURE) ×9 IMPLANT
SUT ETHIBON EXCEL 2-0 V-5 (SUTURE) IMPLANT
SUT ETHIBOND 2 0 SH (SUTURE)
SUT ETHIBOND 2 0 SH 36X2 (SUTURE) IMPLANT
SUT ETHIBOND 2 0 V4 (SUTURE) IMPLANT
SUT ETHIBOND 2 0V4 GREEN (SUTURE) IMPLANT
SUT ETHIBOND 4 0 RB 1 (SUTURE) IMPLANT
SUT ETHIBOND V-5 VALVE (SUTURE) IMPLANT
SUT ETHIBOND X763 2 0 SH 1 (SUTURE) ×9 IMPLANT
SUT MNCRL AB 3-0 PS2 18 (SUTURE) ×11 IMPLANT
SUT MNCRL AB 4-0 PS2 18 (SUTURE) IMPLANT
SUT PDS AB 1 CTX 36 (SUTURE) ×12 IMPLANT
SUT PROLENE 2 0 SH DA (SUTURE) IMPLANT
SUT PROLENE 3 0 RB 1 (SUTURE) IMPLANT
SUT PROLENE 3 0 SH DA (SUTURE) ×4 IMPLANT
SUT PROLENE 3 0 SH1 36 (SUTURE) ×3 IMPLANT
SUT PROLENE 4 0 RB 1 (SUTURE) ×69
SUT PROLENE 4 0 SH DA (SUTURE) ×5 IMPLANT
SUT PROLENE 4-0 RB1 .5 CRCL 36 (SUTURE) ×34 IMPLANT
SUT PROLENE 5 0 C 1 36 (SUTURE) ×4 IMPLANT
SUT PROLENE 6 0 C 1 30 (SUTURE) ×1 IMPLANT
SUT PROLENE 7.0 RB 3 (SUTURE) ×10 IMPLANT
SUT PROLENE 8 0 BV175 6 (SUTURE) ×1 IMPLANT
SUT PROLENE BLUE 7 0 (SUTURE) ×4 IMPLANT
SUT PROLENE POLY MONO (SUTURE) IMPLANT
SUT SILK  1 MH (SUTURE) ×2
SUT SILK 1 MH (SUTURE) ×2 IMPLANT
SUT SILK 2 0 SH CR/8 (SUTURE) IMPLANT
SUT SILK 3 0 SH CR/8 (SUTURE) IMPLANT
SUT STEEL 6MS V (SUTURE) IMPLANT
SUT STEEL STERNAL CCS#1 18IN (SUTURE) IMPLANT
SUT STEEL SZ 6 DBL 3X14 BALL (SUTURE) IMPLANT
SUT VIC AB 1 CT1 18XCR BRD 8 (SUTURE) IMPLANT
SUT VIC AB 1 CT1 8-18 (SUTURE)
SUT VIC AB 1 CTX 27 (SUTURE) IMPLANT
SUT VIC AB 1 CTX 36 (SUTURE)
SUT VIC AB 1 CTX36XBRD ANBCTR (SUTURE) IMPLANT
SUT VIC AB 2-0 CT1 27 (SUTURE) ×6
SUT VIC AB 2-0 CT1 TAPERPNT 27 (SUTURE) IMPLANT
SUT VIC AB 2-0 CTX 27 (SUTURE) IMPLANT
SUT VIC AB 2-0 CTX 36 (SUTURE) ×6 IMPLANT
SUT VIC AB 3-0 SH 27 (SUTURE)
SUT VIC AB 3-0 SH 27X BRD (SUTURE) IMPLANT
SUT VIC AB 3-0 SH 8-18 (SUTURE) ×2 IMPLANT
SUT VIC AB 3-0 X1 27 (SUTURE) IMPLANT
SUT VICRYL 4-0 PS2 18IN ABS (SUTURE) IMPLANT
SYR 10ML KIT SKIN ADHESIVE (MISCELLANEOUS) ×3 IMPLANT
SYR 20CC LL (SYRINGE) ×3 IMPLANT
SYR BULB IRRIGATION 50ML (SYRINGE) ×3 IMPLANT
SYSTEM SAHARA CHEST DRAIN ATS (WOUND CARE) ×5 IMPLANT
TAPE CLOTH SURG 4X10 WHT LF (GAUZE/BANDAGES/DRESSINGS) ×1 IMPLANT
TAPE PAPER 2X10 WHT MICROPORE (GAUZE/BANDAGES/DRESSINGS) ×1 IMPLANT
TOWEL GREEN STERILE (TOWEL DISPOSABLE) ×6 IMPLANT
TOWEL GREEN STERILE FF (TOWEL DISPOSABLE) ×6 IMPLANT
TRAY CATH LUMEN 1 20CM STRL (SET/KITS/TRAYS/PACK) IMPLANT
TRAY FOLEY SILVER 16FR TEMP (SET/KITS/TRAYS/PACK) ×3 IMPLANT
TUBE CONNECTING 12X1/4 (SUCTIONS) ×1 IMPLANT
TUBING INSUFFLATION (TUBING) ×3 IMPLANT
UNDERPAD 30X30 (UNDERPADS AND DIAPERS) ×3 IMPLANT
VALVE AORTIC SZ21 INSP/RESIL (Valve) ×1 IMPLANT
VENT LEFT HEART 12002 (CATHETERS) ×6
WATER STERILE IRR 1000ML POUR (IV SOLUTION) ×12 IMPLANT
YANKAUER SUCT BULB TIP NO VENT (SUCTIONS) ×1 IMPLANT

## 2018-02-22 NOTE — Anesthesia Procedure Notes (Signed)
Arterial Line Insertion Start/End3/20/2019 9:20 AM, 02/22/2018 9:30 AM Performed by: Roberts Gaudy, MD, anesthesiologist  Patient location: OR. Preanesthetic checklist: patient identified, IV checked, site marked, risks and benefits discussed, surgical consent, monitors and equipment checked, pre-op evaluation, timeout performed and anesthesia consent Patient sedated Right, brachial was placed Catheter size: 20 G Hand hygiene performed  and maximum sterile barriers used  Allen's test indicative of satisfactory collateral circulation Attempts: 2 (R radial attempt before brachial) Procedure performed without using ultrasound guided technique. Following insertion, Biopatch, line sutured and dressing applied. Post procedure assessment: normal  Patient tolerated the procedure well with no immediate complications.

## 2018-02-22 NOTE — Op Note (Signed)
CARDIOTHORACIC SURGERY OPERATIVE NOTE  Date of Procedure:  02/22/2018  Preoperative Diagnosis:   Aortic Root Aneurysm  Ascending Thoracic Aortic Aneurysm  Moderate-Severe Aortic Insufficiency  Severe 3-vessel Coronary Artery Disease  Postoperative Diagnosis: Same  Procedure:    Biological Bentall Aortic Root Replacement  Edwards Inspiris Resilia Stented Bovine Pericardial Tissue Valve (size 21 mm, model # 11500A, serial # [id number])  Vascutek Gelweave Valsalva synthetic root graft (size 24 mm, ref # [id number], serial #[id number])   Repair Ascending Thoracic Aortic Aneurysm  Right axillary artery cannulation  Straight graft replacement of ascending thoracic aorta   Coronary Artery Bypass Grafting x 3  Left Internal Mammary Artery to Distal Left Anterior Descending Coronary Artery  Saphenous Vein Graft to Posterior Descending Coronary Artery  Saphenous Vein Graft to Second Obtuse Marginal Branch of Left Circumflex Coronary Artery  Endoscopic Vein Harvest from Right Thigh and Lower Leg  Surgeon: Valentina Gu. Roxy Manns, MD  Assistant: Gaye Pollack, MD and John Giovanni, PA-C  Anesthesia: Roberts Gaudy, MD  Operative Findings:  Aortic root aneurysm  Ascending thoracic aortic aneurysm  Moderate-severe (3+) aortic insufficiency  Mild inferior LV hypokinesis with otherwise normal LV systolic function  Good quality LIMA conduit for grafting  Good quality SVG conduit for grafting  Good quality target vessels for grafting     BRIEF CLINICAL NOTE AND INDICATIONS FOR SURGERY  Patient is a 82 year old male with history of aortic insufficiency and ascending thoracic aortic aneurysmwho returns to the office today with tentative plans to proceed with aortic valve repair or replacement and resection and grafting of a descending thoracic aortic aneurysm on February 22, 2018. He was originally seen in consultation on September 05, 2017. Previous echocardiograms revealed at least  moderate if not severe aortic insufficiency with normal left ventricular systolic function. MR angiograms of the thoracic aorta revealed annual aortic ectasia with fusiform aneurysmal enlargement of the aortic root and ascending thoracic aorta with maximum transverse diameter approaching 5 cm. At that time the patient reported stable symptoms of mild exertional shortness of breath and chronic bilateral lower extremity edema consistent with chronic diastolic congestive heart failure, [place] Heart Association functional class 1-2.He underwent transesophageal echocardiogram on September 27, 2017. Left ventricular function appeared normal with ejection fraction estimated 60-65%, although the left ventricle was "mildly dilated".End-diastolic and end-systolic diameters of the left ventricle were not reported. 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 The aortic valve was trileaflet with incomplete coaptation and at least moderate if not severe aortic insufficiency. The diastolic pressure halftime was reported 360 msand vena contractawidth 6.9 mm.He states that over the past 6 months he has developed worsening symptoms of exertional shortness of breath. He denies any resting shortness of breath, PND, orthopnea, or lower extremity edema. He gets tired more easily than he used to. He has never had any chest pain or chest tightness either with activity or at rest. I saw him on follow-up on January 11, 2018 and we made tentative plans to proceed with surgery later this month. Since then he underwent diagnostic cardiac catheterization by Dr. Saunders Revel on January 16, 2018. He was found to have severe three-vessel coronary artery disease with mildly elevated left and right heart filling pressures. He returns to our office today with tentative plans to proceed with surgery on February 22, 2018. The patient has been seen in consultation and counseled at  length regarding the indications, risks and potential benefits of surgery.  All questions have been answered, and the patient provides full informed consent for the operation as described.     DETAILS OF THE OPERATIVE PROCEDURE  Preparation:  The patient is brought to the operating room on the above mentioned date and central monitoring was established by the anesthesia team including placement of Swan-Ganz catheter and bilateral radial arterial lines. The patient is placed in the supine position on the operating table.  Intravenous antibiotics are administered. General endotracheal anesthesia is induced uneventfully. A Foley catheter is placed.  Baseline transesophageal echocardiogram was performed.  Findings were notable for mild left ventricular systolic dysfunction with inferior wall hypokinesis.  The left ventricle otherwise appeared normal.  There was moderate to severe central aortic insufficiency.  The aortic valve is trileaflet.  The sinuses of Valsalva were completely effaced by a large aneurysm involving the aortic root.  Right ventricular size and function was normal.  There was mild central mitral regurgitation.  The patient's chest, abdomen, both groins, and both lower extremities are prepared and draped in a sterile manner. A time out procedure is performed.   Surgical Approach and Conduit Harvest:  A median sternotomy incision was performed and the left internal mammary artery is dissected from the chest wall and prepared for bypass grafting. The left internal mammary artery is notably good quality conduit. Simultaneously, saphenous vein is obtained from the patient's right thigh and leg using endoscopic vein harvest technique. The saphenous vein is notably good quality conduit. After removal of the saphenous vein, the small surgical incisions in the lower extremity are closed with absorbable suture. Following systemic heparinization, the left internal mammary artery was transected  distally noted to have excellent flow.   Extracorporeal Cardiopulmonary Bypass and Myocardial Protection:  The pericardium is opened. The ascending aorta is dilated in appearance.  The aorta tapers to approximately 3 cm diameter at the level of the innominate artery.  A small incision is made in the right deltopectoral groove and completed through the subcutaneous tissues with electrocautery.  The pectoralis major muscles are split longitudinally.  The deep pectoralis fascia was incised.  The pectoralis minor muscle is retracted laterally.  The right axillary artery was exposed using sharp dissection.  The patient is heparinized for cardiopulmonary bypass.  An 8 mm Gore-Tex graft with pre-attached distal connector for cardiopulmonary bypass circuit is sewn to the right axillary artery in end-to-side fashion.  The right atrium is cannulated for cardioplegia bypass.  Adequate heparinization is verified.    A retrograde cardioplegia cannula is placed through the right atrium into the coronary sinus.  The operative field was continuously flooded with carbon dioxide gas.  The entire pre-bypass portion of the operation was notable for stable hemodynamics.  Cardiopulmonary bypass was begun and a left ventricular vent placed through the right superior pulmonary vein.  The surface of the heart inspected. Distal target vessels are selected for coronary artery bypass grafting. A cardioplegia cannula is placed in the ascending aorta.  A temperature probe was placed in the interventricular septum.    The patient is cooled to 28C systemic temperature.  The aortic cross clamp is applied immediately below the takeoff of the innominate artery and cardioplegia is delivered initially in an antegrade fashion through the aortic root using modified [place] cold blood cardioplegia ([place] blood cardioplegia protocol).   The initial cardioplegic arrest is rapid with early diastolic arrest, and 2/3 of the initial arresting  dose is administered retrograde through the coronary sinus once the heart was arrested.  Repeat doses of cardioplegia are administered at 90 minutes and every 30 minutes thereafter through the coronary sinus catheter and through subsequently placed vein grafts in order to maintain completely flat electrocardiogram.  Myocardial protection was felt to be excellent.   Coronary Artery Bypass Grafting:   The posterior descending branch of the right coronary artery was grafted using a reversed saphenous vein graft in an end-to-side fashion.  At the site of distal anastomosis the target vessel was good quality and measured approximately 1.7 mm in diameter.  The second obtuse marginal branch of the left circumflex coronary artery was grafted using a reversed saphenous vein graft in an end-to-side fashion.  At the site of distal anastomosis the target vessel was good quality and measured approximately 2.0 mm in diameter.  The distal left anterior coronary artery was grafted with the left internal mammary artery in an end-to-side fashion.  At the site of distal anastomosis the target vessel was good quality and measured approximately 2.0 mm in diameter.   Repair of Ascending Thoracic Aortic Aneurysm and Biological Bentall Aortic Root Replacement:  The aorta is transected immediately below the cross-clamp.  The aortic root aneurysm is also transected just above the origin of the right coronary artery.  The entire ascending thoracic aortic aneurysm is resected.  The aortic valve is inspected.  The aortic valve is trileaflet.  The sinuses of Valsalva are completely effaced by a large aneurysm involving the aortic root.  The aortic annulus itself is not dilated.  Each of the 3 aortic valve leaflets are severely thickened and retracted at the nodulus of [place], suggestive of possible rheumatic disease.  Aortic valve repair would require decalcification of the leaflets be associated with the potential for unknown  long-term durability.  A decision is made to proceed with aortic root replacement using a bioprosthetic tissue valve and synthetic root conduit.  3 leaflets of the aortic valve are excised.  The chronically occluded right coronary artery is oversewn at its origin.  The left main coronary artery is mobilized on a separate button of aortic tissue.  The remainder of the sinuses of Valsalva are excised.  The aortic annulus was sized to accept a 21 mm stented bioprosthetic tissue valve.  Biological Bentall aortic root replacement is performed using an Edwards Inspiris Resilia Stented Bovine Pericardial Tissue Valve (size 21 mm, model # 11500A, serial # [id number]) implanted inside the proximal end of a Vascutek Gelweave Valsalva synthetic root graft (size 24 mm, ref # [id number], serial #[id number]).  The proximal suture line is performed using interrupted 2-0 Ethibond horizontal mattress pledgeted sutures with pledgets in the supra annular position.  The left main coronary artery is reimplanted into the corresponding segment of the sinus of Valsalva graft after creating a circular hole in the graft using thermal cautery.  A separate portion of the distal end of the 24 mm vascular graft is sewn directly to the distal descending thoracic aorta immediately beneath the aortic cross-clamp using running 4-0 Prolene suture with Teflon felt strip to buttress the suture line.  The distal aortic graft is sewn to the distal end of the aortic root graft using running 4-0 Prolene suture after trimming and beveling each graft segment to an appropriate length.  The septal myocardial temperature rose rapidly after reperfusion of the left internal mammary artery graft.  All air was evacuated through the aortic graft.  One final dose of warm retrograde "reanimation dose" cardioplegia was administered through the coronary sinus catheter while all air was  evacuated through the aortic root.  The aortic cross clamp was removed after a  total cross clamp time of 157 minutes.   Procedure Completion:  Both proximal vein graft anastomoses were placed directly to the ascending aortic graft using a partial occlusion clamp.  All proximal and distal coronary anastomoses were inspected for hemostasis and appropriate graft orientation. Epicardial pacing wires are fixed to the right ventricular outflow tract and to the right atrial appendage. The patient is rewarmed to 37C temperature. The left ventricular vent was removed.  The patient is weaned and disconnected from cardiopulmonary bypass.  The patient's rhythm at separation from bypass was AV paced.  The patient was weaned from cardiopulmonary bypass on low dose dopamine for renal perfusion. Total cardiopulmonary bypass time for the operation was 203 minutes.  Followup transesophageal echocardiogram performed after separation from bypass revealed a well-seated bioprosthetic tissue valve in the aortic position that was functioning normally.  There was no aortic insufficiency.  Mean transvalvular gradient across the aortic valve was estimated between 4 and 7 mmHg.  Left ventricular function appeared normal.  There were otherwise no changes from the preoperative exam.  The venous cannula was removed uneventfully. Protamine was administered to reverse the anticoagulation.  The axillary graft was transected with a vascular stapler..  The mediastinum and pleural space were inspected for hemostasis and irrigated with saline solution. The mediastinum and left pleural space were drained using 3 chest tubes placed through separate stab incisions inferiorly.  The soft tissues anterior to the aorta were reapproximated loosely. The sternum is closed with double strength sternal wire. The soft tissues anterior to the sternum were closed in multiple layers and the skin is closed with a running subcuticular skin closure.  The right axillary incision was irrigated with saline solution and inspected for  hemostasis.  The right axillary incision was closed in multiple layers the post-bypass portion of the operation was notable for stable rhythm and hemodynamics.  The patient received a total of 2 packs adult platelets and 2 units fresh frozen plasma due to coagulopathy and thrombocytopenia after separation from cardiopulmonary bypass and reversal of heparin with protamine.  The patient received 2 units packed red blood cells during the procedure due to anemia which was present preoperatively and exacerbated by acute blood loss and hemodilution during cardiopulmonary bypass.   Disposition:  The patient tolerated the procedure well and is transported to the surgical intensive care in stable condition. There are no intraoperative complications. All sponge instrument and needle counts are verified correct at completion of the operation.   Valentina Gu. Roxy Manns MD 02/22/2018 4:39 PM

## 2018-02-22 NOTE — OR Nursing (Signed)
10:30am - Per Dr. Roxy Manns, called volunteer desk to notify patient's family of delayed start of case

## 2018-02-22 NOTE — Progress Notes (Signed)
  Echocardiogram Echocardiogram Transesophageal has been performed.  Rodney Wiley 02/22/2018, 10:36 AM

## 2018-02-22 NOTE — Brief Op Note (Signed)
02/22/2018  4:33 PM  PATIENT:  Rodney Wiley  82 y.o. male  PRE-OPERATIVE DIAGNOSIS:  AI TAA  POST-OPERATIVE DIAGNOSIS:  AI TAA  PROCEDURE:  Procedure(s): AORTIC VALVE REPLACEMENT -Biological Bentall aortic root replacement, (N/A) THORACIC ASCENDING ANEURYSM REPAIR (AAA) Resection of ascending aorta aneurysm (N/A) TRANSESOPHAGEAL ECHOCARDIOGRAM (TEE) (N/A) CORONARY ARTERY BYPASS GRAFTING (CABG) x three, using left internal mammary artery and right leg greater saphenous vein harvested endoscopically (N/A) LIMA-LAD SVG-OM2 SVG-PDA  SURGEON:  Surgeon(s) and Role:    * Rexene Alberts, MD - Primary  PHYSICIAN ASSISTANT: WAYNE GOLD PA-C  ASSISTANTS: Gilford Raid MD   ANESTHESIA:   general  EBL:  1550 mL   BLOOD ADMINISTERED: 2u PRBCs, 2u FFP, 2 packs platelets  DRAINS: PLEURAL AND PERICARDIAL CHEST TUBES   LOCAL MEDICATIONS USED:  NONE  SPECIMEN:  Source of Specimen:  AORTA/AORTIC VALVE LEAFLETS  DISPOSITION OF SPECIMEN:  PATHOLOGY  COUNTS:  YES  TOURNIQUET:  * No tourniquets in log *  DICTATION: .Dragon Dictation  PLAN OF CARE: Admit to inpatient   PATIENT DISPOSITION:  ICU - intubated and hemodynamically stable.   Delay start of Pharmacological VTE agent (>24hrs) due to surgical blood loss or risk of bleeding: yes

## 2018-02-22 NOTE — Anesthesia Procedure Notes (Addendum)
Central Venous Catheter Insertion Performed by: Oleta Mouse, MD, anesthesiologist Start/End3/20/2019 7:50 AM, 02/22/2018 7:55 AM Patient location: Pre-op. Preanesthetic checklist: patient identified, IV checked, site marked, risks and benefits discussed, surgical consent, monitors and equipment checked, pre-op evaluation, timeout performed and anesthesia consent Position: Trendelenburg Lidocaine 1% used for infiltration and patient sedated Hand hygiene performed , maximum sterile barriers used  and Seldinger technique used Catheter size: 9 Fr Total catheter length 10. Central line was placed.MAC introducer Swan type:thermodilution Procedure performed using ultrasound guided technique. Ultrasound Notes:anatomy identified, needle tip was noted to be adjacent to the nerve/plexus identified, no ultrasound evidence of intravascular and/or intraneural injection and image(s) printed for medical record Attempts: 1 Following insertion, line sutured, dressing applied and Biopatch. Post procedure assessment: blood return through all ports, free fluid flow and no air  Patient tolerated the procedure well with no immediate complications.

## 2018-02-22 NOTE — Progress Notes (Signed)
CT surgery p.m. Rounds  Patient under warming blanket after combined Bentall-CABG Hemodynamics stable with minimal chest tube output 1 amp of bicarb postop for -7 base deficit Continue postoperative care protocols

## 2018-02-22 NOTE — Anesthesia Procedure Notes (Signed)
Central Venous Catheter Insertion Performed by: Oleta Mouse, MD, anesthesiologist Start/End3/20/2019 7:50 AM, 02/22/2018 7:55 AM Patient location: Pre-op. Preanesthetic checklist: patient identified, IV checked, site marked, risks and benefits discussed, surgical consent, monitors and equipment checked, pre-op evaluation, timeout performed and anesthesia consent Hand hygiene performed  and maximum sterile barriers used  PA cath was placed.Swan type:thermodilution Procedure performed without using ultrasound guided technique. Attempts: 1 Patient tolerated the procedure well with no immediate complications.

## 2018-02-22 NOTE — Anesthesia Procedure Notes (Signed)
Procedure Name: Intubation Date/Time: 02/22/2018 9:16 AM Performed by: Leonor Liv, CRNA Pre-anesthesia Checklist: Patient identified, Emergency Drugs available, Suction available and Patient being monitored Patient Re-evaluated:Patient Re-evaluated prior to induction Oxygen Delivery Method: Circle System Utilized Preoxygenation: Pre-oxygenation with 100% oxygen Induction Type: IV induction Ventilation: Mask ventilation without difficulty Laryngoscope Size: Mac and 4 Grade View: Grade I Tube type: Subglottic suction tube Tube size: 8.0 mm Number of attempts: 1 Airway Equipment and Method: Stylet and Oral airway Placement Confirmation: ETT inserted through vocal cords under direct vision,  positive ETCO2 and breath sounds checked- equal and bilateral Secured at: 23 cm Tube secured with: Tape Dental Injury: Teeth and Oropharynx as per pre-operative assessment

## 2018-02-22 NOTE — Interval H&P Note (Signed)
History and Physical Interval Note:  02/22/2018 6:40 AM  [place]  has presented today for surgery, with the diagnosis of AI TAA  The various methods of treatment have been discussed with the patient and family. After consideration of risks, benefits and other options for treatment, the patient has consented to  Procedure(s): AORTIC VALVE REPAIR OR REPLACEMENT (N/A) THORACIC ASCENDING ANEURYSM REPAIR (AAA) (N/A) TRANSESOPHAGEAL ECHOCARDIOGRAM (TEE) (N/A) CORONARY ARTERY BYPASS GRAFTING (CABG) (N/A) as a surgical intervention .  The patient's history has been reviewed, patient examined, no change in status, stable for surgery.  I have reviewed the patient's chart and labs.  Questions were answered to the patient's satisfaction.     Rexene Alberts

## 2018-02-22 NOTE — Transfer of Care (Signed)
Immediate Anesthesia Transfer of Care Note  Patient: [place]  Procedure(s) Performed: AORTIC VALVE REPLACEMENT -Herbalist aortic root replacement, (N/A Chest) THORACIC ASCENDING ANEURYSM REPAIR (AAA) Resection of ascending aorta aneurysm (N/A Chest) TRANSESOPHAGEAL ECHOCARDIOGRAM (TEE) (N/A ) CORONARY ARTERY BYPASS GRAFTING (CABG) x three, using left internal mammary artery and right leg greater saphenous vein harvested endoscopically (N/A Chest)  Patient Location: ICU  Anesthesia Type:General  Level of Consciousness: Patient remains intubated per anesthesia plan  Airway & Oxygen Therapy: Patient remains intubated per anesthesia plan and Patient placed on Ventilator (see vital sign flow sheet for setting)  Post-op Assessment: Report given to RN  Post vital signs: Reviewed and stable  Last Vitals:  Vitals:   02/22/18 0645  BP: (!) 163/68  Pulse: (!) 57  Resp: 16  Temp: (!) 36.3 C  SpO2: 97%    Last Pain: There were no vitals filed for this visit.       Complications: No apparent anesthesia complications

## 2018-02-22 NOTE — Progress Notes (Signed)
Patient did not tolerate weaning with o2 sats dropping 89-91%.   NIF: -15 VC: 300 RSBI: 70  Patient was switched back to SIMV/PRVC with a RR of 10 and FIO2 of 60%. Will continue to monitor for the ability to wean.

## 2018-02-22 NOTE — Anesthesia Preprocedure Evaluation (Addendum)
Anesthesia Evaluation  Patient identified by MRN, date of birth, ID band Patient awake    Reviewed: Allergy & Precautions, NPO status , Patient's Chart, lab work & pertinent test results  Airway Mallampati: II  TM Distance: >3 FB Neck ROM: Full    Dental  (+) Edentulous Upper, Dental Advisory Given   Pulmonary    Pulmonary exam normal        Cardiovascular hypertension,  Rhythm:Irregular Rate:Abnormal     Neuro/Psych    GI/Hepatic   Endo/Other    Renal/GU      Musculoskeletal   Abdominal Normal abdominal exam  (+)   Peds  Hematology   Anesthesia Other Findings   Reproductive/Obstetrics                            Anesthesia Physical Anesthesia Plan  ASA: III  Anesthesia Plan: General   Post-op Pain Management:    Induction: Intravenous  PONV Risk Score and Plan: Ondansetron and Dexamethasone  Airway Management Planned: Oral ETT  Additional Equipment: Arterial line, CVP, PA Cath, 3D TEE and Ultrasound Guidance Line Placement  Intra-op Plan:   Post-operative Plan: Post-operative intubation/ventilation  Informed Consent: I have reviewed the patients History and Physical, chart, labs and discussed the procedure including the risks, benefits and alternatives for the proposed anesthesia with the patient or authorized representative who has indicated his/her understanding and acceptance.     Plan Discussed with: CRNA and Anesthesiologist  Anesthesia Plan Comments:         Anesthesia Quick Evaluation

## 2018-02-22 NOTE — Anesthesia Procedure Notes (Signed)
Arterial Line Insertion Start/End3/20/2019 8:00 AM, 02/22/2018 8:10 AM Performed by: Leonor Liv, CRNA, CRNA  Patient location: Pre-op. Preanesthetic checklist: patient identified, IV checked, site marked, risks and benefits discussed, surgical consent, monitors and equipment checked, pre-op evaluation, timeout performed and anesthesia consent Lidocaine 1% used for infiltration and patient sedated Left, radial was placed Catheter size: 20 G Hand hygiene performed , maximum sterile barriers used  and Seldinger technique used Allen's test indicative of satisfactory collateral circulation Attempts: 1 Procedure performed without using ultrasound guided technique. Following insertion, Biopatch and dressing applied. Post procedure assessment: normal  Patient tolerated the procedure well with no immediate complications.

## 2018-02-23 ENCOUNTER — Inpatient Hospital Stay (HOSPITAL_COMMUNITY): Payer: Medicare HMO

## 2018-02-23 ENCOUNTER — Other Ambulatory Visit: Payer: Self-pay

## 2018-02-23 ENCOUNTER — Encounter (HOSPITAL_COMMUNITY): Payer: Self-pay | Admitting: Thoracic Surgery (Cardiothoracic Vascular Surgery)

## 2018-02-23 LAB — BASIC METABOLIC PANEL
Anion gap: 7 (ref 5–15)
BUN: 25 mg/dL — ABNORMAL HIGH (ref 6–20)
CO2: 22 mmol/L (ref 22–32)
Calcium: 7.7 mg/dL — ABNORMAL LOW (ref 8.9–10.3)
Chloride: 111 mmol/L (ref 101–111)
Creatinine, Ser: 1.45 mg/dL — ABNORMAL HIGH (ref 0.61–1.24)
GFR calc Af Amer: 51 mL/min — ABNORMAL LOW (ref >60)
GFR calc non Af Amer: 44 mL/min — ABNORMAL LOW (ref >60)
Glucose, Bld: 142 mg/dL — ABNORMAL HIGH (ref 65–99)
Potassium: 4.3 mmol/L (ref 3.5–5.1)
Sodium: 140 mmol/L (ref 135–145)

## 2018-02-23 LAB — PREPARE PLATELET PHERESIS
Unit division: 0
Unit division: 0

## 2018-02-23 LAB — POCT I-STAT 3, ART BLOOD GAS (G3+)
Acid-base deficit: 6 mmol/L — ABNORMAL HIGH (ref 0.0–2.0)
Acid-base deficit: 5 mmol/L — ABNORMAL HIGH (ref 0.0–2.0)
Acid-base deficit: 4 mmol/L — ABNORMAL HIGH (ref 0.0–2.0)
Acid-base deficit: 6 mmol/L — ABNORMAL HIGH (ref 0.0–2.0)
Bicarbonate: 19.4 mmol/L — ABNORMAL LOW (ref 20.0–28.0)
Bicarbonate: 19.7 mmol/L — ABNORMAL LOW (ref 20.0–28.0)
Bicarbonate: 20.3 mmol/L (ref 20.0–28.0)
Bicarbonate: 18.8 mmol/L — ABNORMAL LOW (ref 20.0–28.0)
O2 Saturation: 88 %
O2 Saturation: 96 %
O2 Saturation: 94 %
O2 Saturation: 91 %
Patient temperature: 36.7
Patient temperature: 37.4
Patient temperature: 37.6
Patient temperature: 37.5
TCO2: 20 mmol/L — ABNORMAL LOW (ref 22–32)
TCO2: 21 mmol/L — ABNORMAL LOW (ref 22–32)
TCO2: 21 mmol/L — ABNORMAL LOW (ref 22–32)
TCO2: 20 mmol/L — ABNORMAL LOW (ref 22–32)
pCO2 arterial: 35.3 mmHg (ref 32.0–48.0)
pCO2 arterial: 35.4 mmHg (ref 32.0–48.0)
pCO2 arterial: 33.1 mmHg (ref 32.0–48.0)
pCO2 arterial: 32.6 mmHg (ref 32.0–48.0)
pH, Arterial: 7.345 — ABNORMAL LOW (ref 7.350–7.450)
pH, Arterial: 7.355 (ref 7.350–7.450)
pH, Arterial: 7.4 (ref 7.350–7.450)
pH, Arterial: 7.37 (ref 7.350–7.450)
pO2, Arterial: 57 mmHg — ABNORMAL LOW (ref 83.0–108.0)
pO2, Arterial: 85 mmHg (ref 83.0–108.0)
pO2, Arterial: 71 mmHg — ABNORMAL LOW (ref 83.0–108.0)
pO2, Arterial: 64 mmHg — ABNORMAL LOW (ref 83.0–108.0)

## 2018-02-23 LAB — CREATININE, SERUM
Creatinine, Ser: 1.45 mg/dL — ABNORMAL HIGH (ref 0.61–1.24)
Creatinine, Ser: 1.92 mg/dL — ABNORMAL HIGH (ref 0.61–1.24)
GFR calc Af Amer: 51 mL/min — ABNORMAL LOW (ref >60)
GFR calc Af Amer: 37 mL/min — ABNORMAL LOW (ref >60)
GFR calc non Af Amer: 44 mL/min — ABNORMAL LOW (ref >60)
GFR calc non Af Amer: 32 mL/min — ABNORMAL LOW (ref >60)

## 2018-02-23 LAB — BPAM PLATELET PHERESIS
Blood Product Expiration Date: 201903202359
Blood Product Expiration Date: 201903212359
ISSUE DATE / TIME: 201903201511
ISSUE DATE / TIME: 201903201511
Unit Type and Rh: 5100
Unit Type and Rh: 6200

## 2018-02-23 LAB — POCT I-STAT, CHEM 8
BUN: 28 mg/dL — ABNORMAL HIGH (ref 6–20)
Calcium, Ion: 1.14 mmol/L — ABNORMAL LOW (ref 1.15–1.40)
Chloride: 107 mmol/L (ref 101–111)
Creatinine, Ser: 1.7 mg/dL — ABNORMAL HIGH (ref 0.61–1.24)
Glucose, Bld: 149 mg/dL — ABNORMAL HIGH (ref 65–99)
HCT: 25 % — ABNORMAL LOW (ref 39.0–52.0)
Hemoglobin: 8.5 g/dL — ABNORMAL LOW (ref 13.0–17.0)
Potassium: 4.1 mmol/L (ref 3.5–5.1)
Sodium: 143 mmol/L (ref 135–145)
TCO2: 21 mmol/L — ABNORMAL LOW (ref 22–32)

## 2018-02-23 LAB — CBC
HCT: 26.5 % — ABNORMAL LOW (ref 39.0–52.0)
HCT: 24.3 % — ABNORMAL LOW (ref 39.0–52.0)
Hemoglobin: 8.9 g/dL — ABNORMAL LOW (ref 13.0–17.0)
Hemoglobin: 8.4 g/dL — ABNORMAL LOW (ref 13.0–17.0)
MCH: 30.7 pg (ref 26.0–34.0)
MCH: 32.1 pg (ref 26.0–34.0)
MCHC: 33.6 g/dL (ref 30.0–36.0)
MCHC: 34.6 g/dL (ref 30.0–36.0)
MCV: 91.4 fL (ref 78.0–100.0)
MCV: 92.7 fL (ref 78.0–100.0)
Platelets: 140 10*3/uL — ABNORMAL LOW (ref 150–400)
Platelets: 115 10*3/uL — ABNORMAL LOW (ref 150–400)
RBC: 2.9 MIL/uL — ABNORMAL LOW (ref 4.22–5.81)
RBC: 2.62 MIL/uL — ABNORMAL LOW (ref 4.22–5.81)
RDW: 13.8 % (ref 11.5–15.5)
RDW: 14.3 % (ref 11.5–15.5)
WBC: 9.1 10*3/uL (ref 4.0–10.5)
WBC: 9.7 10*3/uL (ref 4.0–10.5)

## 2018-02-23 LAB — GLUCOSE, CAPILLARY
Glucose-Capillary: 114 mg/dL — ABNORMAL HIGH (ref 65–99)
Glucose-Capillary: 126 mg/dL — ABNORMAL HIGH (ref 65–99)
Glucose-Capillary: 132 mg/dL — ABNORMAL HIGH (ref 65–99)
Glucose-Capillary: 146 mg/dL — ABNORMAL HIGH (ref 65–99)
Glucose-Capillary: 148 mg/dL — ABNORMAL HIGH (ref 65–99)
Glucose-Capillary: 148 mg/dL — ABNORMAL HIGH (ref 65–99)
Glucose-Capillary: 159 mg/dL — ABNORMAL HIGH (ref 65–99)

## 2018-02-23 LAB — PREPARE FRESH FROZEN PLASMA
Unit division: 0
Unit division: 0

## 2018-02-23 LAB — BPAM FFP
Blood Product Expiration Date: 201903212359
Blood Product Expiration Date: 201903212359
ISSUE DATE / TIME: 201903201507
ISSUE DATE / TIME: 201903201507
Unit Type and Rh: 6200
Unit Type and Rh: 6200

## 2018-02-23 LAB — MAGNESIUM
Magnesium: 3.1 mg/dL — ABNORMAL HIGH (ref 1.7–2.4)
Magnesium: 2.8 mg/dL — ABNORMAL HIGH (ref 1.7–2.4)
Magnesium: 2.7 mg/dL — ABNORMAL HIGH (ref 1.7–2.4)

## 2018-02-23 MED ORDER — FUROSEMIDE 10 MG/ML IJ SOLN
INTRAMUSCULAR | Status: AC
  Administered 2018-02-23: 40 mg via INTRAVENOUS
  Filled 2018-02-23: qty 4

## 2018-02-23 MED ORDER — INSULIN ASPART 100 UNIT/ML ~~LOC~~ SOLN
0.0000 [IU] | SUBCUTANEOUS | Status: DC
  Administered 2018-02-23 – 2018-02-25 (×9): 2 [IU] via SUBCUTANEOUS

## 2018-02-23 MED ORDER — CHLORHEXIDINE GLUCONATE CLOTH 2 % EX PADS
6.0000 | MEDICATED_PAD | Freq: Every day | CUTANEOUS | Status: DC
  Administered 2018-02-23 – 2018-03-01 (×7): 6 via TOPICAL

## 2018-02-23 MED ORDER — METOCLOPRAMIDE HCL 5 MG/ML IJ SOLN
10.0000 mg | Freq: Four times a day (QID) | INTRAMUSCULAR | Status: AC
  Administered 2018-02-23 – 2018-02-24 (×4): 10 mg via INTRAVENOUS
  Filled 2018-02-23 (×4): qty 2

## 2018-02-23 MED ORDER — SODIUM CHLORIDE 0.9% FLUSH
10.0000 mL | Freq: Two times a day (BID) | INTRAVENOUS | Status: DC
  Administered 2018-02-23 – 2018-02-25 (×3): 10 mL
  Administered 2018-02-25: 20 mL
  Administered 2018-02-26 – 2018-02-28 (×4): 10 mL

## 2018-02-23 MED ORDER — FUROSEMIDE 10 MG/ML IJ SOLN
40.0000 mg | Freq: Once | INTRAMUSCULAR | Status: AC
  Administered 2018-02-23: 40 mg via INTRAVENOUS

## 2018-02-23 MED ORDER — SODIUM CHLORIDE 0.9% FLUSH: 10.0000 mL | INTRAVENOUS | Status: DC | PRN

## 2018-02-23 MED ORDER — FUROSEMIDE 10 MG/ML IJ SOLN
40.0000 mg | Freq: Once | INTRAMUSCULAR | Status: AC
  Administered 2018-02-23: 40 mg via INTRAVENOUS
  Filled 2018-02-23: qty 4

## 2018-02-23 NOTE — Progress Notes (Signed)
Patient ID: [name], male   DOB: [birth year], 82 y.o.   MRN: [id number] TCTS Evening Rounds:  Hemodynamically stable today. Only on dop 2 for renal perfusion.  Rhythm is sinus with PVC's 80's. Backup VVI pacer.  Sats 92% on HFNC.  Diuresing.  BMET    Component Value Date/Time   NA 143 02/23/2018 1620   NA 139 03/22/2017 1506   K 4.1 02/23/2018 1620   CL 107 02/23/2018 1620   CO2 22 02/23/2018 0343   GLUCOSE 149 (H) 02/23/2018 1620   BUN 28 (H) 02/23/2018 1620   BUN 27 03/22/2017 1506   CREATININE 1.70 (H) 02/23/2018 1620   CALCIUM 7.7 (L) 02/23/2018 0343   GFRNONAA 32 (L) 02/23/2018 1614   GFRAA 37 (L) 02/23/2018 1614   CBC    Component Value Date/Time   WBC 9.7 02/23/2018 1614   RBC 2.62 (L) 02/23/2018 1614   HGB 8.5 (L) 02/23/2018 1620   HCT 25.0 (L) 02/23/2018 1620   PLT 115 (L) 02/23/2018 1614   MCV 92.7 02/23/2018 1614   MCH 32.1 02/23/2018 1614   MCHC 34.6 02/23/2018 1614   RDW 14.3 02/23/2018 1614   LYMPHSABS 1.1 01/12/2018 1413   MONOABS 0.5 01/12/2018 1413   EOSABS 0.2 01/12/2018 1413   BASOSABS 0.0 01/12/2018 1413

## 2018-02-23 NOTE — Procedures (Signed)
Extubation Procedure Note  Patient Details:   Name: Rodney Wiley DOB: 1936/05/24 MRN: [id number]   Airway Documentation:     Evaluation  O2 sats: stable throughout Complications: No apparent complications Patient did tolerate procedure well. Bilateral Breath Sounds: Clear, Diminished   Yes pt extubated to 4L [place] titrated to 6L for sats greater than 93. RN at bedside. NARD VSS, No stridor noted. Pt able to speak. Will cont to monitor.    Lottie Rater 02/23/2018, 9:22 AM

## 2018-02-23 NOTE — Progress Notes (Addendum)
TCTS DAILY ICU PROGRESS NOTE                   [address]            [place] [address]          [phone]   1 Day Post-Op Procedure(s) (LRB): AORTIC VALVE REPLACEMENT -Biological Bentall aortic root replacement, (N/A) THORACIC ASCENDING ANEURYSM REPAIR (AAA) Resection of ascending aorta aneurysm (N/A) TRANSESOPHAGEAL ECHOCARDIOGRAM (TEE) (N/A) CORONARY ARTERY BYPASS GRAFTING (CABG) x three, using left internal mammary artery and right leg greater saphenous vein harvested endoscopically (N/A)  Total Length of Stay:  LOS: 1 day   Subjective: Feels some soreness but pain control is good  Objective: Vital signs in last 24 hours: Temp:  [93.4 F (34.1 C)-99 F (37.2 C)] 99 F (37.2 C) (03/21 0700) Pulse Rate:  [44-91] 88 (03/21 0700) Cardiac Rhythm: A-V Sequential paced (03/21 0400) Resp:  [0-43] 15 (03/21 0700) BP: (98-119)/(51-86) 110/55 (03/21 0700) SpO2:  [90 %-99 %] 97 % (03/21 0700) Arterial Line BP: (110-149)/(39-60) 146/48 (03/21 0700) FiO2 (%):  [40 %-100 %] 60 % (03/21 0405) Weight:  [197 lb 15.6 oz (89.8 kg)] 197 lb 15.6 oz (89.8 kg) (03/21 0400)  Filed Weights   02/22/18 0704 02/23/18 0400  Weight: 177 lb 8 oz (80.5 kg) 197 lb 15.6 oz (89.8 kg)    Weight change:    Hemodynamic parameters for last 24 hours: PAP: (19-36)/(11-23) 30/19 CO:  [3.2 L/min-4.6 L/min] 4.6 L/min CI:  [1.7 L/min/m2-2.4 L/min/m2] 2.4 L/min/m2  Intake/Output from previous day: 03/20 0701 - 03/21 0700 In: 10337.2 [I.V.:6428.2; JJOAC:1660; NG/GT:40; IV Piggyback:1900] Out: 4950 [Urine:2600; Emesis/NG output:20; Blood:1550; Chest Tube:780]  Vent Mode: SIMV;PRVC FiO2 (%):  [40 %-100 %] 60 % Set Rate:  [4 bmp-12 bmp] 10 bmp Vt Set:  [660 mL] 660 mL PEEP:  [5 cmH20] 5 cmH20 Pressure Support:  [10 cmH20] 10 cmH20 Plateau Pressure:  [12 cmH20-22 cmH20] 15 cmH20   Intake/Output this shift: No intake/output data recorded.  Current Meds: Scheduled Meds: .  acetaminophen  1,000 mg Oral Q6H   Or  . acetaminophen (TYLENOL) oral liquid 160 mg/5 mL  1,000 mg Per Tube Q6H  . aspirin EC  325 mg Oral Daily   Or  . aspirin  324 mg Per Tube Daily  . bisacodyl  10 mg Oral Daily   Or  . bisacodyl  10 mg Rectal Daily  . chlorhexidine gluconate (MEDLINE KIT)  15 mL Mouth Rinse BID  . docusate sodium  200 mg Oral Daily  . insulin aspart  0-24 Units Subcutaneous Q4H  . mouth rinse  15 mL Mouth Rinse QID  . [START ON 02/24/2018] pantoprazole  40 mg Oral Daily  . sodium chloride flush  3 mL Intravenous Q12H   Continuous Infusions: . sodium chloride 20 mL/hr at 02/22/18 2000  . sodium chloride    . sodium chloride 20 mL/hr at 02/22/18 1720  .  ceFAZolin (ANCEF) IV Stopped (02/23/18 0721)  . dexmedetomidine (PRECEDEX) IV infusion 0.4 mcg/kg/hr (02/23/18 6301)  . DOPamine 3 mcg/kg/min (02/23/18 0400)  . famotidine (PEPCID) IV Stopped (02/22/18 2220)  . lactated ringers    . lactated ringers    . lactated ringers 20 mL/hr at 02/22/18 2000  . nitroGLYCERIN    . phenylephrine (NEO-SYNEPHRINE) Adult infusion 40 mcg/min (02/23/18 0700)   PRN Meds:.sodium chloride, lactated ringers, metoprolol tartrate, midazolam, morphine injection, ondansetron (ZOFRAN) IV, oxyCODONE, sodium chloride flush, traMADol  General  appearance: alert, cooperative and no distress Neurologic: intact and follows simple commands, nods head, MAEx4, opens eyes Heart: regular rate and rhythm, no rub and no murmur Lungs: clear anteriorly Abdomen: soft, non-tender; bowel sounds normal; no masses,  no organomegaly Extremities: + edema Wound: dressings CDI  Lab Results: CBC: Recent Labs    02/22/18 2315 02/23/18 0343  WBC 8.6 9.1  HGB 9.4* 8.9*  HCT 26.5* 26.5*  PLT 131* 140*   BMET:  Recent Labs    02/20/18 1143  02/22/18 2300 02/22/18 2315 02/23/18 0343  NA 140   < > 141  --  140  K 4.9   < > 4.5  --  4.3  CL 112*   < > 108  --  111  CO2 18*  --   --   --  22    GLUCOSE 98   < > 178*  --  142*  BUN 29*   < > 25*  --  25*  CREATININE 1.68*   < > 1.30* 1.45* 1.45*  CALCIUM 9.2  --   --   --  7.7*   < > = values in this interval not displayed.    CMET: Lab Results  Component Value Date   WBC 9.1 02/23/2018   HGB 8.9 (L) 02/23/2018   HCT 26.5 (L) 02/23/2018   PLT 140 (L) 02/23/2018   GLUCOSE 142 (H) 02/23/2018   CHOL 106 03/11/2017   TRIG 194 (H) 03/11/2017   HDL 28 (L) 03/11/2017   LDLCALC 39 03/11/2017   ALT 29 02/20/2018   AST 25 02/20/2018   NA 140 02/23/2018   K 4.3 02/23/2018   CL 111 02/23/2018   CREATININE 1.45 (H) 02/23/2018   BUN 25 (H) 02/23/2018   CO2 22 02/23/2018   INR 1.51 02/22/2018   HGBA1C 5.2 02/20/2018    ABG    Component Value Date/Time   PHART 7.345 (L) 02/23/2018 0355   PCO2ART 35.3 02/23/2018 0355   PO2ART 57.0 (L) 02/23/2018 0355   HCO3 19.4 (L) 02/23/2018 0355   TCO2 20 (L) 02/23/2018 0355   ACIDBASEDEF 6.0 (H) 02/23/2018 0355   O2SAT 88.0 02/23/2018 0355    PT/INR:  Recent Labs    02/22/18 1714  LABPROT 18.1*  INR 1.51   Radiology: Dg Chest Port 1 View  Result Date: 02/22/2018 CLINICAL DATA:  Post CABG EXAM: PORTABLE CHEST 1 VIEW COMPARISON:  Portable exam 1728 hours compared to 02/20/2018 FINDINGS: Tip of endotracheal tube projects 3.1 cm above carina. Nasogastric tube extends into stomach. RIGHT jugular Swan-Ganz catheter with tip projecting over proximal RIGHT pulmonary artery. Mediastinal drains and LEFT thoracostomy tube present. Enlargement of cardiac silhouette post CABG and AVR. Mediastinal contours appear slightly prominent likely related to surgery and technique. Mild LEFT perihilar infiltrate versus atelectasis. No gross pleural effusion or pneumothorax. IMPRESSION: Expected postoperative changes as above. Electronically Signed   By: Lavonia Dana M.D.   On: 02/22/2018 17:46     Assessment/Plan: S/P Procedure(s) (LRB): AORTIC VALVE REPLACEMENT -Biological Bentall aortic root  replacement, (N/A) THORACIC ASCENDING ANEURYSM REPAIR (AAA) Resection of ascending aorta aneurysm (N/A) TRANSESOPHAGEAL ECHOCARDIOGRAM (TEE) (N/A) CORONARY ARTERY BYPASS GRAFTING (CABG) x three, using left internal mammary artery and right leg greater saphenous vein harvested endoscopically (N/A)  1 doing well overall 2 AV paced, junct with PVC's(couplet) on EKG 3 hemodyn stable with MAP in 70's, currently on renal dose dopamineand 40 mcg neo. If weight is accurate is 17 pounds up from preop. UO  is pretty good and creat is up a little to 1.45. Wean neo as able. CI is 2.4, PA 30/19. Will need diuresis- will give 40 IV now 4 wean precidex , cont vent care, wean as able. Plateau pressure 15 on SIMV/PRVC. Hypoxemia on ABG with slight acidosis, gas being repeated- extubate soon hopefully 5 expected acute blood loss anemia- cont to monitor, currently stable, not at transfusion threshhold. 6 thrombocytopenia- mild, improving- cont to monitor 7 CXR shows some mild vascular fullness, atx 8 Blood sugars adeq controlled 10 leave CT's for now- moderate drainage, no air leak  John Giovanni 02/23/2018 7:24 AM   I have seen and examined the patient and agree with the assessment and plan as outlined.  Doing well POD1.  Awake and alert on vent.  Lungs sound clear and CXR looks good.  Looks ready for extubation.  Mobilize after extubation.  Diuresis.    Rexene Alberts, MD 02/23/2018 8:08 AM

## 2018-02-23 NOTE — Progress Notes (Signed)
1 hour post extubation ABG Pao2 of 64 and Spo2 90-92% on 6L [place]. Placed patient on HFNC of 10L and during transition, pt Spo2 fell to 85%. Encouraged deep breathing and patient came up to 92%. RT placed on 15L and patient came up to 95%. RT and RN will titrate as Spo2 will allow.

## 2018-02-24 ENCOUNTER — Inpatient Hospital Stay (HOSPITAL_COMMUNITY): Payer: Medicare HMO

## 2018-02-24 LAB — CBC
HCT: 26.2 % — ABNORMAL LOW (ref 39.0–52.0)
Hemoglobin: 9 g/dL — ABNORMAL LOW (ref 13.0–17.0)
MCH: 32.3 pg (ref 26.0–34.0)
MCHC: 34.4 g/dL (ref 30.0–36.0)
MCV: 93.9 fL (ref 78.0–100.0)
Platelets: 133 10*3/uL — ABNORMAL LOW (ref 150–400)
RBC: 2.79 MIL/uL — ABNORMAL LOW (ref 4.22–5.81)
RDW: 14.4 % (ref 11.5–15.5)
WBC: 13.9 10*3/uL — ABNORMAL HIGH (ref 4.0–10.5)

## 2018-02-24 LAB — BASIC METABOLIC PANEL
Anion gap: 11 (ref 5–15)
Anion gap: 10 (ref 5–15)
BUN: 36 mg/dL — ABNORMAL HIGH (ref 6–20)
BUN: 45 mg/dL — ABNORMAL HIGH (ref 6–20)
CO2: 22 mmol/L (ref 22–32)
CO2: 22 mmol/L (ref 22–32)
Calcium: 7.9 mg/dL — ABNORMAL LOW (ref 8.9–10.3)
Calcium: 7.7 mg/dL — ABNORMAL LOW (ref 8.9–10.3)
Chloride: 106 mmol/L (ref 101–111)
Chloride: 104 mmol/L (ref 101–111)
Creatinine, Ser: 2.12 mg/dL — ABNORMAL HIGH (ref 0.61–1.24)
Creatinine, Ser: 2.42 mg/dL — ABNORMAL HIGH (ref 0.61–1.24)
GFR calc Af Amer: 32 mL/min — ABNORMAL LOW (ref >60)
GFR calc Af Amer: 28 mL/min — ABNORMAL LOW (ref >60)
GFR calc non Af Amer: 28 mL/min — ABNORMAL LOW (ref >60)
GFR calc non Af Amer: 24 mL/min — ABNORMAL LOW (ref >60)
Glucose, Bld: 142 mg/dL — ABNORMAL HIGH (ref 65–99)
Glucose, Bld: 146 mg/dL — ABNORMAL HIGH (ref 65–99)
Potassium: 4.1 mmol/L (ref 3.5–5.1)
Potassium: 3.8 mmol/L (ref 3.5–5.1)
Sodium: 139 mmol/L (ref 135–145)
Sodium: 136 mmol/L (ref 135–145)

## 2018-02-24 LAB — GLUCOSE, CAPILLARY
Glucose-Capillary: 116 mg/dL — ABNORMAL HIGH (ref 65–99)
Glucose-Capillary: 118 mg/dL — ABNORMAL HIGH (ref 65–99)
Glucose-Capillary: 131 mg/dL — ABNORMAL HIGH (ref 65–99)
Glucose-Capillary: 135 mg/dL — ABNORMAL HIGH (ref 65–99)
Glucose-Capillary: 137 mg/dL — ABNORMAL HIGH (ref 65–99)
Glucose-Capillary: 139 mg/dL — ABNORMAL HIGH (ref 65–99)
Glucose-Capillary: 159 mg/dL — ABNORMAL HIGH (ref 65–99)

## 2018-02-24 LAB — MAGNESIUM: Magnesium: 2.8 mg/dL — ABNORMAL HIGH (ref 1.7–2.4)

## 2018-02-24 MED ORDER — AMIODARONE LOAD VIA INFUSION
150.0000 mg | Freq: Once | INTRAVENOUS | Status: AC
  Administered 2018-02-24: 150 mg via INTRAVENOUS

## 2018-02-24 MED ORDER — FUROSEMIDE 10 MG/ML IJ SOLN
40.0000 mg | Freq: Two times a day (BID) | INTRAMUSCULAR | Status: AC
  Administered 2018-02-24 (×2): 40 mg via INTRAVENOUS
  Filled 2018-02-24 (×2): qty 4

## 2018-02-24 MED ORDER — AMIODARONE HCL IN DEXTROSE 360-4.14 MG/200ML-% IV SOLN
60.0000 mg/h | INTRAVENOUS | Status: AC
  Administered 2018-02-24 (×2): 60 mg/h via INTRAVENOUS
  Filled 2018-02-24: qty 200

## 2018-02-24 MED ORDER — AMIODARONE HCL IN DEXTROSE 360-4.14 MG/200ML-% IV SOLN
INTRAVENOUS | Status: AC
  Administered 2018-02-24: 60 mg/h via INTRAVENOUS
  Filled 2018-02-24: qty 200

## 2018-02-24 MED ORDER — AMIODARONE HCL IN DEXTROSE 360-4.14 MG/200ML-% IV SOLN
30.0000 mg/h | INTRAVENOUS | Status: AC
  Administered 2018-02-25 – 2018-02-26 (×4): 30 mg/h via INTRAVENOUS
  Filled 2018-02-24 (×4): qty 200

## 2018-02-24 NOTE — Progress Notes (Addendum)
   02/24/18 1000  Clinical Encounter Type  Visited With Patient  Visit Type Spiritual support  Spiritual Encounters  Spiritual Needs Prayer  Stress Factors  Patient Stress Factors Health changes   Visited with Pt while making rounds on 2H.  Pt was awake and alert, sitting in chair with a smile on his face. Pt states that he is feeling much better. He also, states that his grandson came to visit him last night. Pt expressed his faith as being strong in the [place]. He is a Psychologist, forensic. Pt requested prayer. Chaplain provided emotion and spiritual support through listening and prayer. Matthew Folks

## 2018-02-24 NOTE — Progress Notes (Signed)
Pt now in AF RVR. Dr. Roxy Manns made aware. Verbal orders from Amio gtt with bolus.

## 2018-02-24 NOTE — Progress Notes (Signed)
TCTS BRIEF SICU PROGRESS NOTE  2 Days Post-Op  S/P Procedure(s) (LRB): AORTIC VALVE REPLACEMENT -Biological Bentall aortic root replacement, (N/A) THORACIC ASCENDING ANEURYSM REPAIR (AAA) Resection of ascending aorta aneurysm (N/A) TRANSESOPHAGEAL ECHOCARDIOGRAM (TEE) (N/A) CORONARY ARTERY BYPASS GRAFTING (CABG) x three, using left internal mammary artery and right leg greater saphenous vein harvested endoscopically (N/A)   Episode rapid Afib earlier today, now in NSR on IV amiodarone BP stable  O2 sats 91% on HFNC UOP adequate Minimal chest tube output  Plan: Continue current plan.  D/C tubes  Rexene Alberts, MD 02/24/2018 6:38 PM

## 2018-02-24 NOTE — Progress Notes (Addendum)
TCTS DAILY ICU PROGRESS NOTE                   [address]            [place] [address]          [phone]   2 Days Post-Op Procedure(s) (LRB): AORTIC VALVE REPLACEMENT -Biological Bentall aortic root replacement, (N/A) THORACIC ASCENDING ANEURYSM REPAIR (AAA) Resection of ascending aorta aneurysm (N/A) TRANSESOPHAGEAL ECHOCARDIOGRAM (TEE) (N/A) CORONARY ARTERY BYPASS GRAFTING (CABG) x three, using left internal mammary artery and right leg greater saphenous vein harvested endoscopically (N/A)  Total Length of Stay:  LOS: 2 days   Subjective: Feels ok, some nausea, + SOB  Objective: Vital signs in last 24 hours: Temp:  [98.2 F (36.8 C)-99.9 F (37.7 C)] 99.1 F (37.3 C) (03/22 0404) Pulse Rate:  [44-100] 83 (03/22 0600) Cardiac Rhythm: Normal sinus rhythm;Heart block (03/21 2200) Resp:  [13-37] 22 (03/22 0600) BP: (95-148)/(53-78) 148/78 (03/22 0600) SpO2:  [90 %-98 %] 95 % (03/22 0600) Arterial Line BP: (115-139)/(44-62) 127/54 (03/21 1615) FiO2 (%):  [40 %-60 %] 40 % (03/21 0800) Weight:  [179 lb 7.3 oz (81.4 kg)] 179 lb 7.3 oz (81.4 kg) (03/22 0630)  Filed Weights   02/22/18 0704 02/23/18 0400 02/24/18 0630  Weight: 177 lb 8 oz (80.5 kg) 197 lb 15.6 oz (89.8 kg) 179 lb 7.3 oz (81.4 kg)    Weight change: 1 lb 15.3 oz (0.887 kg)   Hemodynamic parameters for last 24 hours: PAP: (26-51)/(12-18) 51/12 CO:  [4.9 L/min] 4.9 L/min CI:  [2.5 L/min/m2-2.6 L/min/m2] 2.5 L/min/m2  Intake/Output from previous day: 03/21 0701 - 03/22 0700 In: 610.2 [I.V.:410.2; IV Piggyback:200] Out: 2715 [Urine:1995; Emesis/NG output:100; Chest Tube:620]  Intake/Output this shift: No intake/output data recorded.  Current Meds: Scheduled Meds: . acetaminophen  1,000 mg Oral Q6H  . aspirin EC  325 mg Oral Daily  . bisacodyl  10 mg Oral Daily   Or  . bisacodyl  10 mg Rectal Daily  . Chlorhexidine Gluconate Cloth  6 each Topical Daily  . docusate sodium  200 mg  Oral Daily  . insulin aspart  0-24 Units Subcutaneous Q4H  . metoCLOPramide (REGLAN) injection  10 mg Intravenous Q6H  . pantoprazole  40 mg Oral Daily  . sodium chloride flush  10-40 mL Intracatheter Q12H  . sodium chloride flush  3 mL Intravenous Q12H   Continuous Infusions: . sodium chloride Stopped (02/23/18 0800)  .  ceFAZolin (ANCEF) IV 2 g (02/24/18 4742)  . DOPamine 1.988 mcg/kg/min (02/23/18 1700)  . lactated ringers 10 mL/hr at 02/23/18 1700  . phenylephrine (NEO-SYNEPHRINE) Adult infusion Stopped (02/23/18 1100)   PRN Meds:.metoprolol tartrate, morphine injection, ondansetron (ZOFRAN) IV, oxyCODONE, sodium chloride flush, sodium chloride flush, traMADol  General appearance: alert, cooperative and no distress Heart: regular rate and rhythm and + frequent extrasystoles Lungs: fair air exchange Abdomen: soft, nontender Extremities: + edema Wound: dressings CDI  Lab Results: CBC: Recent Labs    02/23/18 1614 02/23/18 1620 02/24/18 0324  WBC 9.7  --  13.9*  HGB 8.4* 8.5* 9.0*  HCT 24.3* 25.0* 26.2*  PLT 115*  --  133*   BMET:  Recent Labs    02/23/18 0343  02/23/18 1620 02/24/18 0324  NA 140  --  143 139  K 4.3  --  4.1 4.1  CL 111  --  107 106  CO2 22  --   --  22  GLUCOSE 142*  --  149* 142*  BUN 25*  --  28* 36*  CREATININE 1.45*   < > 1.70* 2.12*  CALCIUM 7.7*  --   --  7.9*   < > = values in this interval not displayed.    CMET: Lab Results  Component Value Date   WBC 13.9 (H) 02/24/2018   HGB 9.0 (L) 02/24/2018   HCT 26.2 (L) 02/24/2018   PLT 133 (L) 02/24/2018   GLUCOSE 142 (H) 02/24/2018   CHOL 106 03/11/2017   TRIG 194 (H) 03/11/2017   HDL 28 (L) 03/11/2017   LDLCALC 39 03/11/2017   ALT 29 02/20/2018   AST 25 02/20/2018   NA 139 02/24/2018   K 4.1 02/24/2018   CL 106 02/24/2018   CREATININE 2.12 (H) 02/24/2018   BUN 36 (H) 02/24/2018   CO2 22 02/24/2018   INR 1.51 02/22/2018   HGBA1C 5.2 02/20/2018      PT/INR:  Recent Labs      02/22/18 1714  LABPROT 18.1*  INR 1.51   Radiology: No results found. Dg Chest Port 1 View  Result Date: 02/23/2018 CLINICAL DATA:  Aortic valve replacement.  Aortic aneurysm. EXAM: PORTABLE CHEST 1 VIEW COMPARISON:  02/22/2018 FINDINGS: Endotracheal tube remains in good position. Swan-Ganz catheter tip in the main pulmonary artery unchanged. Two chest tubes on the left unchanged. Mediastinal drain unchanged. Negative for pneumothorax. Mild bibasilar atelectasis and small left effusion unchanged. Negative for edema. IMPRESSION: Stable support apparatus. Negative for pneumothorax. Bibasilar atelectasis left greater than right unchanged. Electronically Signed   By: Franchot Gallo M.D.   On: 02/23/2018 11:07   Dg Chest Port 1 View  Result Date: 02/22/2018 CLINICAL DATA:  Post CABG EXAM: PORTABLE CHEST 1 VIEW COMPARISON:  Portable exam 1728 hours compared to 02/20/2018 FINDINGS: Tip of endotracheal tube projects 3.1 cm above carina. Nasogastric tube extends into stomach. RIGHT jugular Swan-Ganz catheter with tip projecting over proximal RIGHT pulmonary artery. Mediastinal drains and LEFT thoracostomy tube present. Enlargement of cardiac silhouette post CABG and AVR. Mediastinal contours appear slightly prominent likely related to surgery and technique. Mild LEFT perihilar infiltrate versus atelectasis. No gross pleural effusion or pneumothorax. IMPRESSION: Expected postoperative changes as above. Electronically Signed   By: Lavonia Dana M.D.   On: 02/22/2018 17:46   Assessment/Plan: S/P Procedure(s) (LRB): AORTIC VALVE REPLACEMENT -Biological Bentall aortic root replacement, (N/A) THORACIC ASCENDING ANEURYSM REPAIR (AAA) Resection of ascending aorta aneurysm (N/A) TRANSESOPHAGEAL ECHOCARDIOGRAM (TEE) (N/A) CORONARY ARTERY BYPASS GRAFTING (CABG) x three, using left internal mammary artery and right leg greater saphenous vein harvested endoscopically (N/A)  1 extubated yesterday, sats ok but  requiring 15 L HFNC 2 CXR - fair insp effort, may be some mild vasc congestion, atx . Small effus- push pulm toilet/rehab as able 3 AKI- creat now 2.12, 2 liters diuresed yesterday, will require further lasix, poss gtt short term. Cont renal dose dop 4 sinus rhythm with 1 deg AVB, PVC's including couplets. K+, Mg++ - ok. Consider adding Beta blocker soon 5 CT- mod drainage- 760 yesterday, 300 last shift- keep in place for now 6 H/H stable - some increase leukocytosis- tmax 99.9- push pulm toilet, monitor closely 7 thrombocytopenia improving 8 CBG's adeq control   John Giovanni 02/24/2018 7:31 AM   I have seen and examined the patient and agree with the assessment and plan as outlined.  Post op elevated serum creatinine - acute exacerbation of baseline CKD, likely due to prerenal azotemia +/- acute kidney injury caused by ATN,  UOP adequate.  Mobilize.  Diuresis.  Watch renal function.  D/C tubes later today or tomorrow, depending on output   Rexene Alberts, MD 02/24/2018 8:14 AM

## 2018-02-24 NOTE — Progress Notes (Signed)
Anesthesiology Followup:  Awake and alert, sitting in chair, complaining of mild shortness of breath  VS: T 37.3 BP 147/84 HR- 104 (SR with 1st degree AVB) RR- 22 O2 Sat 92 on high flow nasal cannula.  K-4.1 glucose- 103 BUN/Cr- 36/2.12 H/H- 9.0/26.2 Platelets- 133,000 WBC- 13,900  CXR: low inspiratory effort with bibasilar atelectasis  Extubated at 09:22 on POD #56  82 year old male two days S/P Bentall procedure with AVR with bioprosthetic valve. Patient has exacerbation of pre-exiting renal dysfunction (baseline Cr. 1.75 range). Still requiring high flow nasal O2. Plan diuresis as tolerated, pulm toilet. Follow renal function closely.  Roberts Gaudy

## 2018-02-25 ENCOUNTER — Inpatient Hospital Stay (HOSPITAL_COMMUNITY): Payer: Medicare HMO

## 2018-02-25 LAB — GLUCOSE, CAPILLARY
Glucose-Capillary: 105 mg/dL — ABNORMAL HIGH (ref 65–99)
Glucose-Capillary: 113 mg/dL — ABNORMAL HIGH (ref 65–99)
Glucose-Capillary: 118 mg/dL — ABNORMAL HIGH (ref 65–99)
Glucose-Capillary: 126 mg/dL — ABNORMAL HIGH (ref 65–99)
Glucose-Capillary: 134 mg/dL — ABNORMAL HIGH (ref 65–99)
Glucose-Capillary: 99 mg/dL (ref 65–99)

## 2018-02-25 LAB — BASIC METABOLIC PANEL
Anion gap: 12 (ref 5–15)
BUN: 52 mg/dL — ABNORMAL HIGH (ref 6–20)
CO2: 23 mmol/L (ref 22–32)
Calcium: 7.6 mg/dL — ABNORMAL LOW (ref 8.9–10.3)
Chloride: 101 mmol/L (ref 101–111)
Creatinine, Ser: 2.82 mg/dL — ABNORMAL HIGH (ref 0.61–1.24)
GFR calc Af Amer: 23 mL/min — ABNORMAL LOW (ref >60)
GFR calc non Af Amer: 20 mL/min — ABNORMAL LOW (ref >60)
Glucose, Bld: 113 mg/dL — ABNORMAL HIGH (ref 65–99)
Potassium: 3.9 mmol/L (ref 3.5–5.1)
Sodium: 136 mmol/L (ref 135–145)

## 2018-02-25 LAB — PREPARE RBC (CROSSMATCH)

## 2018-02-25 LAB — CBC
HCT: 24.8 % — ABNORMAL LOW (ref 39.0–52.0)
Hemoglobin: 8 g/dL — ABNORMAL LOW (ref 13.0–17.0)
MCH: 30.8 pg (ref 26.0–34.0)
MCHC: 32.3 g/dL (ref 30.0–36.0)
MCV: 95.4 fL (ref 78.0–100.0)
Platelets: 125 10*3/uL — ABNORMAL LOW (ref 150–400)
RBC: 2.6 MIL/uL — ABNORMAL LOW (ref 4.22–5.81)
RDW: 14.6 % (ref 11.5–15.5)
WBC: 12.6 10*3/uL — ABNORMAL HIGH (ref 4.0–10.5)

## 2018-02-25 MED ORDER — FUROSEMIDE 10 MG/ML IJ SOLN: 80.0000 mg | Freq: Once | INTRAMUSCULAR | Status: DC

## 2018-02-25 MED ORDER — GABAPENTIN 100 MG PO CAPS
100.0000 mg | ORAL_CAPSULE | Freq: Two times a day (BID) | ORAL | Status: DC
  Administered 2018-02-25 – 2018-03-03 (×12): 100 mg via ORAL
  Filled 2018-02-25 (×12): qty 1

## 2018-02-25 MED ORDER — ATORVASTATIN CALCIUM 20 MG PO TABS
20.0000 mg | ORAL_TABLET | Freq: Every day | ORAL | Status: DC
  Administered 2018-02-25 – 2018-03-03 (×7): 20 mg via ORAL
  Filled 2018-02-25 (×7): qty 1

## 2018-02-25 MED ORDER — AMIODARONE IV BOLUS ONLY 150 MG/100ML
150.0000 mg | Freq: Once | INTRAVENOUS | Status: AC
  Administered 2018-02-25: 150 mg via INTRAVENOUS

## 2018-02-25 MED ORDER — ENOXAPARIN SODIUM 30 MG/0.3ML ~~LOC~~ SOLN
30.0000 mg | SUBCUTANEOUS | Status: DC
  Administered 2018-02-26: 30 mg via SUBCUTANEOUS
  Filled 2018-02-25: qty 0.3

## 2018-02-25 MED ORDER — FA-PYRIDOXINE-CYANOCOBALAMIN 2.5-25-2 MG PO TABS
1.0000 | ORAL_TABLET | Freq: Every day | ORAL | Status: DC
Start: 2018-02-25 — End: 2018-03-03
  Administered 2018-02-25 – 2018-03-03 (×7): 1 via ORAL
  Filled 2018-02-25 (×7): qty 1

## 2018-02-25 MED ORDER — FUROSEMIDE 10 MG/ML IJ SOLN
80.0000 mg | Freq: Three times a day (TID) | INTRAMUSCULAR | Status: DC
  Administered 2018-02-25 – 2018-02-26 (×4): 80 mg via INTRAVENOUS
  Filled 2018-02-25 (×4): qty 8

## 2018-02-25 MED ORDER — ASPIRIN EC 81 MG PO TBEC: 81.0000 mg | DELAYED_RELEASE_TABLET | Freq: Every day | ORAL | Status: DC

## 2018-02-25 MED ORDER — ASPIRIN EC 81 MG PO TBEC
81.0000 mg | DELAYED_RELEASE_TABLET | Freq: Every day | ORAL | Status: DC
  Administered 2018-02-25 – 2018-03-03 (×7): 81 mg via ORAL
  Filled 2018-02-25 (×7): qty 1

## 2018-02-25 MED ORDER — SODIUM CHLORIDE 0.9 % IV SOLN: Freq: Once | INTRAVENOUS | Status: DC

## 2018-02-25 MED ORDER — WARFARIN - PHYSICIAN DOSING INPATIENT
Freq: Every day | Status: DC
  Administered 2018-02-26: 1
  Administered 2018-02-27 – 2018-03-01 (×2)

## 2018-02-25 MED ORDER — WARFARIN SODIUM 2.5 MG PO TABS
2.5000 mg | ORAL_TABLET | Freq: Every day | ORAL | Status: DC
  Administered 2018-02-25 – 2018-02-26 (×2): 2.5 mg via ORAL
  Filled 2018-02-25 (×2): qty 1

## 2018-02-25 MED ORDER — POLYSACCHARIDE IRON COMPLEX 150 MG PO CAPS
150.0000 mg | ORAL_CAPSULE | Freq: Every day | ORAL | Status: DC
  Filled 2018-02-25: qty 1

## 2018-02-25 MED ORDER — DOXAZOSIN MESYLATE 2 MG PO TABS
2.0000 mg | ORAL_TABLET | Freq: Every day | ORAL | Status: DC
  Administered 2018-02-25 – 2018-03-02 (×6): 2 mg via ORAL
  Filled 2018-02-25 (×6): qty 1

## 2018-02-25 NOTE — Progress Notes (Signed)
TCTS BRIEF SICU PROGRESS NOTE  3 Days Post-Op  S/P Procedure(s) (LRB): AORTIC VALVE REPLACEMENT -Biological Bentall aortic root replacement, (N/A) THORACIC ASCENDING ANEURYSM REPAIR (AAA) Resection of ascending aorta aneurysm (N/A) TRANSESOPHAGEAL ECHOCARDIOGRAM (TEE) (N/A) CORONARY ARTERY BYPASS GRAFTING (CABG) x three, using left internal mammary artery and right leg greater saphenous vein harvested endoscopically (N/A)   Stable day but back into rate-controlled Afib BP stable  O2 sats stable UOP improved  Plan: Will start warfarin.  Extra bolus amiodarone  Rexene Alberts, MD 02/25/2018 5:48 PM

## 2018-02-25 NOTE — Progress Notes (Addendum)
[address]       [place] [address]             [phone]        CARDIOTHORACIC SURGERY PROGRESS NOTE   R3 Days Post-Op Procedure(s) (LRB): AORTIC VALVE REPLACEMENT -Biological Bentall aortic root replacement, (N/A) THORACIC ASCENDING ANEURYSM REPAIR (AAA) Resection of ascending aorta aneurysm (N/A) TRANSESOPHAGEAL ECHOCARDIOGRAM (TEE) (N/A) CORONARY ARTERY BYPASS GRAFTING (CABG) x three, using left internal mammary artery and right leg greater saphenous vein harvested endoscopically (N/A)  Subjective: Feels better.  Denies SOB.  Soreness in chest improved since chest tubes removed.  Dyspneic with walking.  Needs PT to assist w/ mobility  Objective: Vital signs: BP Readings from Last 1 Encounters:  02/25/18 (!) 141/79   Pulse Readings from Last 1 Encounters:  02/25/18 66   Resp Readings from Last 1 Encounters:  02/25/18 11   Temp Readings from Last 1 Encounters:  02/25/18 98.3 F (36.8 C) (Oral)    Hemodynamics:    Physical Exam:  Rhythm:   sinus  Breath sounds: clear  Heart sounds:  RRR  Incisions:  Clean and dry  Abdomen:  Soft, non-distended, non-tender  Extremities:  Warm, well-perfused, swollen   Intake/Output from previous day: 03/22 0701 - 03/23 0700 In: 1389.2 [P.O.:360; I.V.:829.2; IV Piggyback:200] Out: 1140 [Urine:920; Chest Tube:220] Intake/Output this shift: Total I/O In: 36.7 [I.V.:36.7] Out: 20 [Urine:20]  Lab Results:  CBC: Recent Labs    02/24/18 0324 02/25/18 0330  WBC 13.9* 12.6*  HGB 9.0* 8.0*  HCT 26.2* 24.8*  PLT 133* 125*    BMET:  Recent Labs    02/24/18 1629 02/25/18 0330  NA 136 136  K 3.8 3.9  CL 104 101  CO2 22 23  GLUCOSE 146* 113*  BUN 45* 52*  CREATININE 2.42* 2.82*  CALCIUM 7.7* 7.6*     PT/INR:   Recent Labs    02/22/18 1714  LABPROT 18.1*  INR 1.51    CBG (last 3)  Recent Labs    02/24/18 2257 02/25/18 0332 02/25/18 0747  GLUCAP 116* 113* 118*    ABG      Component Value Date/Time   PHART 7.370 02/23/2018 1014   PCO2ART 32.6 02/23/2018 1014   PO2ART 64.0 (L) 02/23/2018 1014   HCO3 18.8 (L) 02/23/2018 1014   TCO2 21 (L) 02/23/2018 1620   ACIDBASEDEF 6.0 (H) 02/23/2018 1014   O2SAT 91.0 02/23/2018 1014    CXR: PORTABLE CHEST 1 VIEW  COMPARISON:  Yesterday.  FINDINGS: The right jugular catheter sheath is unchanged. Stable post CABG changes and prosthetic cardiac valve. Subsegmental atelectasis in the left perihilar region and lower lung zone have not changed significantly. Minimal right basilar atelectasis with improvement. The interstitial markings remain mildly prominent. Thoracic spine degenerative changes.  IMPRESSION: 1. Improved right basilar atelectasis. 2. Stable atelectasis on the left. 3. Stable cardiomegaly and mild chronic interstitial lung disease.   Electronically Signed   By: Claudie Revering M.D.   On: 02/25/2018 07:45  Assessment/Plan: S/P Procedure(s) (LRB): AORTIC VALVE REPLACEMENT -Biological Bentall aortic root replacement, (N/A) THORACIC ASCENDING ANEURYSM REPAIR (AAA) Resection of ascending aorta aneurysm (N/A) TRANSESOPHAGEAL ECHOCARDIOGRAM (TEE) (N/A) CORONARY ARTERY BYPASS GRAFTING (CABG) x three, using left internal mammary artery and right leg greater saphenous vein harvested endoscopically (N/A)  Overall stable POD3 Episode post-op Afib yesterday, maintaining NSR on IV amiodarone BP stable off all drips Increased O2 requirement persists but breathing comfortably on HFNC  Expected post  op atelectasis, low lung volumes on CXR - needs increased mobility Post op elevated serum creatinine and acute non-oliguric renal failure - acute exacerbation of baseline CKD, likely due to prerenal azotemia +/- acute kidney injury caused by ATN, UOP adequate but creatinine up further today Expected post op acute blood loss anemia, Hgb down further 8.0 this mornin   Mobilize  Transfuse 1 unit  PRBCs  Increase lasix to stimulate UOP  Leave Foley in place to monitor UOP closely  Continue IV amiodarone for now  PT consult to assist w/ mobility, will ultimately need short term SNF placement   Rexene Alberts, MD 02/25/2018 8:28 AM

## 2018-02-26 LAB — BPAM RBC
Blood Product Expiration Date: 201904022359
Blood Product Expiration Date: 201904072359
Blood Product Expiration Date: 201904082359
Blood Product Expiration Date: 201904122359
Blood Product Expiration Date: 201904142359
ISSUE DATE / TIME: 201903190205
ISSUE DATE / TIME: 201903201124
ISSUE DATE / TIME: 201903201124
ISSUE DATE / TIME: 201903230855
Unit Type and Rh: 9500
Unit Type and Rh: 9500
Unit Type and Rh: 9500
Unit Type and Rh: 9500
Unit Type and Rh: 9500

## 2018-02-26 LAB — TYPE AND SCREEN
ABO/RH(D): O NEG
Antibody Screen: NEGATIVE
Unit division: 0
Unit division: 0
Unit division: 0
Unit division: 0
Unit division: 0

## 2018-02-26 LAB — GLUCOSE, CAPILLARY
Glucose-Capillary: 97 mg/dL (ref 65–99)
Glucose-Capillary: 115 mg/dL — ABNORMAL HIGH (ref 65–99)
Glucose-Capillary: 113 mg/dL — ABNORMAL HIGH (ref 65–99)
Glucose-Capillary: 124 mg/dL — ABNORMAL HIGH (ref 65–99)
Glucose-Capillary: 108 mg/dL — ABNORMAL HIGH (ref 65–99)

## 2018-02-26 LAB — PROTIME-INR
INR: 1.32
Prothrombin Time: 16.3 seconds — ABNORMAL HIGH (ref 11.4–15.2)

## 2018-02-26 LAB — COMPREHENSIVE METABOLIC PANEL
ALT: 7 U/L — ABNORMAL LOW (ref 17–63)
AST: 30 U/L (ref 15–41)
Albumin: 2.7 g/dL — ABNORMAL LOW (ref 3.5–5.0)
Alkaline Phosphatase: 38 U/L (ref 38–126)
Anion gap: 12 (ref 5–15)
BUN: 65 mg/dL — ABNORMAL HIGH (ref 6–20)
CO2: 23 mmol/L (ref 22–32)
Calcium: 7.7 mg/dL — ABNORMAL LOW (ref 8.9–10.3)
Chloride: 104 mmol/L (ref 101–111)
Creatinine, Ser: 3.31 mg/dL — ABNORMAL HIGH (ref 0.61–1.24)
GFR calc Af Amer: 19 mL/min — ABNORMAL LOW (ref >60)
GFR calc non Af Amer: 16 mL/min — ABNORMAL LOW (ref >60)
Glucose, Bld: 111 mg/dL — ABNORMAL HIGH (ref 65–99)
Potassium: 3.1 mmol/L — ABNORMAL LOW (ref 3.5–5.1)
Sodium: 139 mmol/L (ref 135–145)
Total Bilirubin: 0.9 mg/dL (ref 0.3–1.2)
Total Protein: 5.4 g/dL — ABNORMAL LOW (ref 6.5–8.1)

## 2018-02-26 LAB — CBC
HCT: 25.9 % — ABNORMAL LOW (ref 39.0–52.0)
Hemoglobin: 8.9 g/dL — ABNORMAL LOW (ref 13.0–17.0)
MCH: 31.6 pg (ref 26.0–34.0)
MCHC: 34.4 g/dL (ref 30.0–36.0)
MCV: 91.8 fL (ref 78.0–100.0)
Platelets: 120 10*3/uL — ABNORMAL LOW (ref 150–400)
RBC: 2.82 MIL/uL — ABNORMAL LOW (ref 4.22–5.81)
RDW: 15.5 % (ref 11.5–15.5)
WBC: 9.1 10*3/uL (ref 4.0–10.5)

## 2018-02-26 MED ORDER — POTASSIUM CHLORIDE 10 MEQ/50ML IV SOLN
INTRAVENOUS | Status: AC
  Filled 2018-02-26: qty 50

## 2018-02-26 MED ORDER — POTASSIUM CHLORIDE 10 MEQ/50ML IV SOLN
10.0000 meq | INTRAVENOUS | Status: AC
  Administered 2018-02-26 (×3): 10 meq via INTRAVENOUS
  Filled 2018-02-26 (×2): qty 50

## 2018-02-26 MED ORDER — AMIODARONE HCL 200 MG PO TABS
200.0000 mg | ORAL_TABLET | Freq: Two times a day (BID) | ORAL | Status: DC
  Administered 2018-02-26 – 2018-03-03 (×11): 200 mg via ORAL
  Filled 2018-02-26 (×11): qty 1

## 2018-02-26 MED ORDER — FUROSEMIDE 10 MG/ML IJ SOLN
80.0000 mg | Freq: Two times a day (BID) | INTRAMUSCULAR | Status: DC
  Administered 2018-02-26: 80 mg via INTRAVENOUS
  Filled 2018-02-26: qty 8

## 2018-02-26 MED ORDER — FERROUS SULFATE 325 (65 FE) MG PO TABS
325.0000 mg | ORAL_TABLET | Freq: Every day | ORAL | Status: DC
  Administered 2018-02-27 – 2018-03-03 (×5): 325 mg via ORAL
  Filled 2018-02-26 (×5): qty 1

## 2018-02-26 MED ORDER — INSULIN ASPART 100 UNIT/ML ~~LOC~~ SOLN
0.0000 [IU] | Freq: Three times a day (TID) | SUBCUTANEOUS | Status: DC
  Administered 2018-02-26 – 2018-02-27 (×2): 2 [IU] via SUBCUTANEOUS

## 2018-02-26 MED ORDER — MOVING RIGHT ALONG BOOK
Freq: Once | Status: AC
  Administered 2018-02-26: 14:00:00
  Filled 2018-02-26: qty 1

## 2018-02-26 MED ORDER — POTASSIUM CHLORIDE 10 MEQ/50ML IV SOLN
10.0000 meq | INTRAVENOUS | Status: AC
  Administered 2018-02-26 (×3): 10 meq via INTRAVENOUS
  Filled 2018-02-26 (×3): qty 50

## 2018-02-26 NOTE — Evaluation (Signed)
Physical Therapy Evaluation Patient Details Name: Rodney Wiley MRN: [id number] DOB: [birth year] Today's Date: 02/26/2018   History of Present Illness  Admitted for THORACIC ASCENDING ANEURYSM REPAIR (AAA) Resection of ascending aorta aneurysm (N/A); TEE, CABGx3;  has a past medical history of Arthropathy, Ascending aortic aneurysm ([place]), Cardiac murmur, Cardiomegaly, CKD (chronic kidney disease), stage III ([place]), Coronary artery disease involving native heart without angina pectoris, Essential hypertension;  has a past surgical history that includes Shoulder surgery (Right); Elbow surgery (Left)   Clinical Impression   Patient is s/p above surgery resulting in functional limitations due to the deficits listed below (see PT Problem List). Independent PTA, lives alone; Presents with decr functional capacity, decr functional mobility; Plas to go to SNF near his home for post-acute rehab -- PT heartily agrees; I anticipate good progress;  Patient will benefit from skilled PT to increase their independence and safety with mobility to allow discharge to the venue listed below.       Follow Up Recommendations SNF    Equipment Recommendations  Rolling walker with 5" wheels;3in1 (PT)    Recommendations for Other Services       Precautions / Restrictions Precautions Precautions: Sternal Restrictions Weight Bearing Restrictions: Yes      Mobility  Bed Mobility                  Transfers Overall transfer level: Needs assistance Equipment used: Ambulation equipment used(pushed wheelchair) Transfers: Sit to/from Stand Sit to Stand: Min assist         General transfer comment: Min assist to steady; good rise and good control of descent to sit  Ambulation/Gait Ambulation/Gait assistance: Min guard;+2 safety/equipment Ambulation Distance (Feet): (Hallway ambulation) Assistive device: Pushed wheelchair Gait Pattern/deviations: Step-through pattern;Decreased step length -  right;Decreased step length - left;Decreased stride length Gait velocity: slowed   General Gait Details: Cues to self-monitor for activity tolerance  Stairs            Wheelchair Mobility    Modified Rankin (Stroke Patients Only)       Balance Overall balance assessment: Mild deficits observed, not formally tested                                           Pertinent Vitals/Pain Pain Assessment: 0-10 Pain Score: 5  Pain Location: sternum with coughing Pain Descriptors / Indicators: Grimacing Pain Intervention(s): Limited activity within patient's tolerance    Home Living Family/patient expects to be discharged to:: Private residence Living Arrangements: Alone               Additional Comments: Pt tells me he plans to go to SNF    Prior Function Level of Independence: Independent               Hand Dominance        Extremity/Trunk Assessment   Upper Extremity Assessment Upper Extremity Assessment: Generalized weakness    Lower Extremity Assessment Lower Extremity Assessment: Generalized weakness       Communication   Communication: No difficulties  Cognition Arousal/Alertness: Awake/alert Behavior During Therapy: WFL for tasks assessed/performed Overall Cognitive Status: Within Functional Limits for tasks assessed  General Comments General comments (skin integrity, edema, etc.): Session conducted on 15 liters O2 via high flow [place]; O2 sats remained greater than or equal to 94%    Exercises     Assessment/Plan    PT Assessment Patient needs continued PT services  PT Problem List Decreased strength;Decreased range of motion;Decreased activity tolerance;Decreased balance;Decreased mobility;Decreased knowledge of use of DME;Decreased knowledge of precautions;Cardiopulmonary status limiting activity;Pain       PT Treatment Interventions DME instruction;Gait  training;Stair training;Functional mobility training;Therapeutic activities;Therapeutic exercise;Balance training;Neuromuscular re-education;Cognitive remediation;Patient/family education    PT Goals (Current goals can be found in the Care Plan section)  Acute Rehab PT Goals Patient Stated Goal: heal up, back to bowling when MD says he can PT Goal Formulation: With patient Time For Goal Achievement: 03/19/18 Potential to Achieve Goals: Good    Frequency Min 2X/week   Barriers to discharge        Co-evaluation               AM-PAC PT "6 Clicks" Daily Activity  Outcome Measure Difficulty turning over in bed (including adjusting bedclothes, sheets and blankets)?: A Lot Difficulty moving from lying on back to sitting on the side of the bed? : A Lot Difficulty sitting down on and standing up from a chair with arms (e.g., wheelchair, bedside commode, etc,.)?: A Lot Help needed moving to and from a bed to chair (including a wheelchair)?: A Little Help needed walking in hospital room?: A Little Help needed climbing 3-5 steps with a railing? : A Lot 6 Click Score: 14    End of Session Equipment Utilized During Treatment: Oxygen Activity Tolerance: Patient tolerated treatment well Patient left: in chair;with call bell/phone within reach Nurse Communication: Mobility status PT Visit Diagnosis: Unsteadiness on feet (R26.81);Difficulty in walking, not elsewhere classified (R26.2)    Time: 8563-1497 PT Time Calculation (min) (ACUTE ONLY): 20 min   Charges:   PT Evaluation $PT Eval Moderate Complexity: 1 Mod     PT G Codes:        Roney Marion, PT  Acute Rehabilitation Services Pager [phone] Office [phone]   [place]letta [place] 02/26/2018, 11:00 AM

## 2018-02-26 NOTE — Progress Notes (Signed)
Patient's epicardial pacing wires were pulled following review of coagulation labs and cleansing of epicardial wire insertion sites.  Patient supine in bed during removal and no ectopy noted when wires were pulled.  Will continue to monitor and assess patient on bed rest for the next couple of hours.  Rodney Wiley

## 2018-02-26 NOTE — Progress Notes (Signed)
[address]       [place] [address]             [phone]        CARDIOTHORACIC SURGERY PROGRESS NOTE   R4 Days Post-Op Procedure(s) (LRB): AORTIC VALVE REPLACEMENT -Biological Bentall aortic root replacement, (N/A) THORACIC ASCENDING ANEURYSM REPAIR (AAA) Resection of ascending aorta aneurysm (N/A) TRANSESOPHAGEAL ECHOCARDIOGRAM (TEE) (N/A) CORONARY ARTERY BYPASS GRAFTING (CABG) x three, using left internal mammary artery and right leg greater saphenous vein harvested endoscopically (N/A)  Subjective: Feels well.  Had a good night.   Just ambulated w/ PT.  Less dyspnea.  Still not much of an appetite  Objective: Vital signs: BP Readings from Last 1 Encounters:  02/26/18 125/75   Pulse Readings from Last 1 Encounters:  02/26/18 67   Resp Readings from Last 1 Encounters:  02/26/18 14   Temp Readings from Last 1 Encounters:  02/26/18 98.7 F (37.1 C) (Oral)    Hemodynamics:    Physical Exam:  Rhythm:   sinus  Breath sounds: clear  Heart sounds:  RRR  Incisions:  Clean and dry  Abdomen:  Soft, non-distended, non-tender  Extremities:  Warm, well-perfused, mild edema   Intake/Output from previous day: 03/23 0701 - 03/24 0700 In: 2598.8 [P.O.:700; I.V.:1248.8; Blood:350; IV Piggyback:50] Out: 2495 [Urine:2495] Intake/Output this shift: Total I/O In: 16.7 [I.V.:16.7] Out: -   Lab Results:  CBC: Recent Labs    02/25/18 0330 02/26/18 0435  WBC 12.6* 9.1  HGB 8.0* 8.9*  HCT 24.8* 25.9*  PLT 125* 120*    BMET:  Recent Labs    02/25/18 0330 02/26/18 0435  NA 136 139  K 3.9 3.1*  CL 101 104  CO2 23 23  GLUCOSE 113* 111*  BUN 52* 65*  CREATININE 2.82* 3.31*  CALCIUM 7.6* 7.7*     PT/INR:   Recent Labs    02/26/18 0435  LABPROT 16.3*  INR 1.32    CBG (last 3)  Recent Labs    02/25/18 2334 02/26/18 0347 02/26/18 0813  GLUCAP 105* 97 115*    ABG    Component Value Date/Time   PHART 7.370 02/23/2018 1014     PCO2ART 32.6 02/23/2018 1014   PO2ART 64.0 (L) 02/23/2018 1014   HCO3 18.8 (L) 02/23/2018 1014   TCO2 21 (L) 02/23/2018 1620   ACIDBASEDEF 6.0 (H) 02/23/2018 1014   O2SAT 91.0 02/23/2018 1014    CXR: n/a  Assessment/Plan: S/P Procedure(s) (LRB): AORTIC VALVE REPLACEMENT -Biological Bentall aortic root replacement, (N/A) THORACIC ASCENDING ANEURYSM REPAIR (AAA) Resection of ascending aorta aneurysm (N/A) TRANSESOPHAGEAL ECHOCARDIOGRAM (TEE) (N/A) CORONARY ARTERY BYPASS GRAFTING (CABG) x three, using left internal mammary artery and right leg greater saphenous vein harvested endoscopically (N/A)  Overall stable POD4 Another episode post-op Afib yesterday, maintaining NSR on IV amiodarone BP stable  Increased O2 requirement persists but breathing comfortably on HFNC  Expected post op atelectasis, low lung volumes on CXR - needs increased mobility Post op elevated serum creatinine and acute non-oliguric renal failure - acute exacerbation of baseline CKD, likely due to prerenal azotemia +/- acute kidney injury caused by ATN, UOP excellent but creatinine up further today Expected post op acute blood loss anemia, Hgb up 8.9 this morning Hypokalemia, induced by loop diuretics   Mobilize  Continue lasix but decrease to bid  Convert amiodarone to oral  Coumadin, low dose ASA  Continue PT  Supplement potassium   Rexene Alberts, MD 02/26/2018 9:36  AM

## 2018-02-27 LAB — PROTIME-INR
INR: 1.95
Prothrombin Time: 22.1 seconds — ABNORMAL HIGH (ref 11.4–15.2)

## 2018-02-27 LAB — GLUCOSE, CAPILLARY
Glucose-Capillary: 120 mg/dL — ABNORMAL HIGH (ref 65–99)
Glucose-Capillary: 132 mg/dL — ABNORMAL HIGH (ref 65–99)
Glucose-Capillary: 83 mg/dL (ref 65–99)
Glucose-Capillary: 108 mg/dL — ABNORMAL HIGH (ref 65–99)

## 2018-02-27 LAB — CBC
HCT: 26.2 % — ABNORMAL LOW (ref 39.0–52.0)
Hemoglobin: 8.9 g/dL — ABNORMAL LOW (ref 13.0–17.0)
MCH: 31.1 pg (ref 26.0–34.0)
MCHC: 34 g/dL (ref 30.0–36.0)
MCV: 91.6 fL (ref 78.0–100.0)
Platelets: 131 10*3/uL — ABNORMAL LOW (ref 150–400)
RBC: 2.86 MIL/uL — ABNORMAL LOW (ref 4.22–5.81)
RDW: 15 % (ref 11.5–15.5)
WBC: 7.1 10*3/uL (ref 4.0–10.5)

## 2018-02-27 LAB — BASIC METABOLIC PANEL
Anion gap: 12 (ref 5–15)
BUN: 72 mg/dL — ABNORMAL HIGH (ref 6–20)
CO2: 25 mmol/L (ref 22–32)
Calcium: 7.6 mg/dL — ABNORMAL LOW (ref 8.9–10.3)
Chloride: 101 mmol/L (ref 101–111)
Creatinine, Ser: 3.47 mg/dL — ABNORMAL HIGH (ref 0.61–1.24)
GFR calc Af Amer: 18 mL/min — ABNORMAL LOW (ref >60)
GFR calc non Af Amer: 15 mL/min — ABNORMAL LOW (ref >60)
Glucose, Bld: 105 mg/dL — ABNORMAL HIGH (ref 65–99)
Potassium: 3 mmol/L — ABNORMAL LOW (ref 3.5–5.1)
Sodium: 138 mmol/L (ref 135–145)

## 2018-02-27 MED ORDER — FUROSEMIDE 40 MG PO TABS
40.0000 mg | ORAL_TABLET | Freq: Every day | ORAL | Status: DC
  Administered 2018-02-27 – 2018-02-28 (×2): 40 mg via ORAL
  Filled 2018-02-27 (×2): qty 1

## 2018-02-27 MED ORDER — WARFARIN SODIUM 1 MG PO TABS
1.0000 mg | ORAL_TABLET | Freq: Every day | ORAL | Status: DC
  Administered 2018-02-27 – 2018-02-28 (×2): 1 mg via ORAL
  Filled 2018-02-27 (×2): qty 1

## 2018-02-27 MED ORDER — CARVEDILOL 3.125 MG PO TABS
3.1250 mg | ORAL_TABLET | Freq: Two times a day (BID) | ORAL | Status: DC
  Administered 2018-02-27 – 2018-02-28 (×4): 3.125 mg via ORAL
  Filled 2018-02-27 (×4): qty 1

## 2018-02-27 MED ORDER — FUROSEMIDE 40 MG PO TABS: 40.0000 mg | ORAL_TABLET | Freq: Every day | ORAL | Status: DC

## 2018-02-27 MED ORDER — POTASSIUM CHLORIDE 10 MEQ/50ML IV SOLN: 10.0000 meq | INTRAVENOUS | Status: DC

## 2018-02-27 MED ORDER — POTASSIUM CHLORIDE CRYS ER 20 MEQ PO TBCR
40.0000 meq | EXTENDED_RELEASE_TABLET | Freq: Three times a day (TID) | ORAL | Status: DC
  Administered 2018-02-27 (×3): 40 meq via ORAL
  Filled 2018-02-27 (×3): qty 2

## 2018-02-27 MED FILL — Magnesium Sulfate Inj 50%: INTRAMUSCULAR | Qty: 10 | Status: AC

## 2018-02-27 MED FILL — Dexmedetomidine HCl in NaCl 0.9% IV Soln 400 MCG/100ML: INTRAVENOUS | Qty: 100 | Status: AC

## 2018-02-27 MED FILL — Heparin Sodium (Porcine) Inj 1000 Unit/ML: INTRAMUSCULAR | Qty: 30 | Status: AC

## 2018-02-27 MED FILL — Potassium Chloride Inj 2 mEq/ML: INTRAVENOUS | Qty: 40 | Status: AC

## 2018-02-27 NOTE — NC FL2 (Signed)
[place] MEDICAID FL2 LEVEL OF CARE SCREENING TOOL     IDENTIFICATION  Patient Name: Rodney Wiley Birthdate: 1936-01-19 Sex: male Admission Date (Current Location): 02/22/2018  [facility] and [place] Number:  Engineering geologist and Address:  [facility] [place], [place] [address] [place], [place] [address]      Provider Number: [id number]  Attending Physician Name and Address:  Rexene Alberts, MD  Relative Name and Phone Number:       Current Level of Care: Hospital Recommended Level of Care: [place] Prior Approval Number:    Date Approved/Denied:   PASRR Number: [id number]  Discharge Plan: SNF    Current Diagnoses: Patient Active Problem List   Diagnosis Date Noted  . 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 S/P CABG x 3 02/22/2018  . S/P ascending aortic aneurysm repair 02/22/2018  . Coronary artery disease involving native heart without angina pectoris   . 1st degree AV block 12/21/2017  . Stage 3 chronic kidney disease ([place]) 03/22/2017  . Thoracic aortic aneurysm ([place]) 01/27/2017  . Renal artery stenosis ([place]) 01/27/2017  . Hyperlipidemia LDL goal <70 01/27/2017  . Aortic regurgitation 10/27/2016  . Mitral regurgitation 10/27/2016  . Essential hypertension 02/08/2007  . HYPERTENSION, BENIGN SYSTEMIC 02/02/2007    Orientation RESPIRATION BLADDER Height & Weight     Self, Time, Situation, Place  O2(see DC summary) Incontinent, Indwelling catheter Weight: 182 lb 5.1 oz (82.7 kg) Height:  5\' 7"  (170.2 cm)  BEHAVIORAL SYMPTOMS/MOOD NEUROLOGICAL BOWEL NUTRITION STATUS      Incontinent Diet(cardiac; carb modified)  AMBULATORY STATUS COMMUNICATION OF NEEDS Skin   Extensive Assist Verbally Surgical wounds                       Personal Care Assistance Level of Assistance  Bathing, Dressing Bathing Assistance: Maximum assistance   Dressing  Assistance: Maximum assistance     Functional Limitations Info             SPECIAL CARE FACTORS FREQUENCY  PT (By licensed PT), OT (By licensed OT)     PT Frequency: 5/wk OT Frequency: 5/wk            Contractures      Additional Factors Info  Code Status, Allergies, Insulin Sliding Scale Code Status Info: FULL Allergies Info: NKA   Insulin Sliding Scale Info: 3/day       Current Medications (02/27/2018):  This is the current hospital active medication list Current Facility-Administered Medications  Medication Dose Route Frequency Provider Last Rate Last Dose  . acetaminophen (TYLENOL) tablet 1,000 mg  1,000 mg Oral Q6H Rexene Alberts, MD   1,000 mg at 02/27/18 1148  . amiodarone (PACERONE) tablet 200 mg  200 mg Oral BID PC Rexene Alberts, MD   200 mg at 02/27/18 9833  . aspirin EC tablet 81 mg  81 mg Oral Daily Rexene Alberts, MD   81 mg at 02/27/18 8250  . atorvastatin (LIPITOR) tablet 20 mg  20 mg Oral Daily Rexene Alberts, MD   20 mg at 02/27/18 5397  . bisacodyl (DULCOLAX) EC tablet 10 mg  10 mg Oral Daily Rexene Alberts, MD   10 mg at 02/26/18 1049   Or  . bisacodyl (DULCOLAX) suppository 10 mg  10 mg Rectal Daily Rexene Alberts, MD      . carvedilol (COREG)  tablet 3.125 mg  3.125 mg Oral BID Rexene Alberts, MD   3.125 mg at 02/27/18 1308  . Chlorhexidine Gluconate Cloth 2 % PADS 6 each  6 each Topical Daily Rexene Alberts, MD   6 each at 02/27/18 [phone]  . docusate sodium (COLACE) capsule 200 mg  200 mg Oral Daily Rexene Alberts, MD   200 mg at 02/26/18 1048  . doxazosin (CARDURA) tablet 2 mg  2 mg Oral QHS Rexene Alberts, MD   2 mg at 02/26/18 2152  . ferrous sulfate tablet 325 mg  325 mg Oral Q breakfast Rexene Alberts, MD   325 mg at 02/27/18 4696  . folic acid-pyridoxine-cyancobalamin (FOLTX) 2.5-25-2 MG per tablet 1 tablet  1 tablet Oral Daily Rexene Alberts, MD   1 tablet at 02/27/18 2952  . furosemide (LASIX) tablet 40 mg  40 mg Oral  Daily Rexene Alberts, MD   40 mg at 02/27/18 8413  . gabapentin (NEURONTIN) capsule 100 mg  100 mg Oral BID Rexene Alberts, MD   100 mg at 02/27/18 2440  . insulin aspart (novoLOG) injection 0-24 Units  0-24 Units Subcutaneous TID AC & HS Rexene Alberts, MD   2 Units at 02/27/18 1148  . lactated ringers infusion   Intravenous Continuous Rexene Alberts, MD   Stopped at 02/26/18 0000  . metoprolol tartrate (LOPRESSOR) injection 2.5-5 mg  2.5-5 mg Intravenous Q2H PRN Rexene Alberts, MD      . morphine 2 MG/ML injection 1-2 mg  1-2 mg Intravenous Q1H PRN Rexene Alberts, MD   2 mg at 02/24/18 1845  . ondansetron (ZOFRAN) injection 4 mg  4 mg Intravenous Q6H PRN Rexene Alberts, MD   4 mg at 02/23/18 1111  . oxyCODONE (Oxy IR/ROXICODONE) immediate release tablet 5-10 mg  5-10 mg Oral Q3H PRN Rexene Alberts, MD   10 mg at 02/24/18 2253  . pantoprazole (PROTONIX) EC tablet 40 mg  40 mg Oral Daily Rexene Alberts, MD   40 mg at 02/27/18 1027  . potassium chloride SA (K-DUR,KLOR-CON) CR tablet 40 mEq  40 mEq Oral TID WC Rexene Alberts, MD   40 mEq at 02/27/18 1148  . sodium chloride flush (NS) 0.9 % injection 10-40 mL  10-40 mL Intracatheter Q12H Rexene Alberts, MD   10 mL at 02/27/18 0925  . sodium chloride flush (NS) 0.9 % injection 10-40 mL  10-40 mL Intracatheter PRN Rexene Alberts, MD      . sodium chloride flush (NS) 0.9 % injection 3 mL  3 mL Intravenous Q12H Rexene Alberts, MD   3 mL at 02/27/18 0925  . sodium chloride flush (NS) 0.9 % injection 3 mL  3 mL Intravenous PRN Rexene Alberts, MD      . traMADol Veatrice Bourbon) tablet 50-100 mg  50-100 mg Oral Q4H PRN Rexene Alberts, MD   100 mg at 02/24/18 1624  . warfarin (COUMADIN) tablet 1 mg  1 mg Oral q1800 Rexene Alberts, MD      . Warfarin - Physician Dosing Inpatient   Does not apply [id number] Rexene Alberts, MD   1 each at 02/26/18 1720     Discharge Medications: Please see discharge summary for a list of discharge  medications.  Relevant Imaging Results:  Relevant Lab Results:   Additional Information SS#: [id number]  Jorge Ny, LCSW

## 2018-02-27 NOTE — Progress Notes (Signed)
[address]       [place] [address]             [phone]        CARDIOTHORACIC SURGERY PROGRESS NOTE   R5 Days Post-Op Procedure(s) (LRB): AORTIC VALVE REPLACEMENT -Biological Bentall aortic root replacement, (N/A) THORACIC ASCENDING ANEURYSM REPAIR (AAA) Resection of ascending aorta aneurysm (N/A) TRANSESOPHAGEAL ECHOCARDIOGRAM (TEE) (N/A) CORONARY ARTERY BYPASS GRAFTING (CABG) x three, using left internal mammary artery and right leg greater saphenous vein harvested endoscopically (N/A)  Subjective: Looks good and feels much better.  Slept well.  Moving well with much less dyspnea and O2 sats improved.  Appetite improved.  + BM yesterday  Objective: Vital signs: BP Readings from Last 1 Encounters:  02/27/18 136/68   Pulse Readings from Last 1 Encounters:  02/27/18 73   Resp Readings from Last 1 Encounters:  02/27/18 (!) 23   Temp Readings from Last 1 Encounters:  02/27/18 98.4 F (36.9 C) (Oral)    Hemodynamics:    Physical Exam:  Rhythm:   sinus  Breath sounds: clear  Heart sounds:  RRR  Incisions:  Clean and dry  Abdomen:  Soft, non-distended, non-tender  Extremities:  Warm, well-perfused    Intake/Output from previous day: 03/24 0701 - 03/25 0700 In: 236.5 [I.V.:86.5; IV Piggyback:150] Out: 425 [Urine:425] Intake/Output this shift: No intake/output data recorded.  Lab Results:  CBC: Recent Labs    02/26/18 0435 02/27/18 0244  WBC 9.1 7.1  HGB 8.9* 8.9*  HCT 25.9* 26.2*  PLT 120* 131*    BMET:  Recent Labs    02/26/18 0435 02/27/18 0244  NA 139 138  K 3.1* 3.0*  CL 104 101  CO2 23 25  GLUCOSE 111* 105*  BUN 65* 72*  CREATININE 3.31* 3.47*  CALCIUM 7.7* 7.6*     PT/INR:   Recent Labs    02/27/18 0244  LABPROT 22.1*  INR 1.95    CBG (last 3)  Recent Labs    02/26/18 1201 02/26/18 1650 02/26/18 2109  GLUCAP 113* 124* 108*    ABG    Component Value Date/Time   PHART 7.370 02/23/2018 1014     PCO2ART 32.6 02/23/2018 1014   PO2ART 64.0 (L) 02/23/2018 1014   HCO3 18.8 (L) 02/23/2018 1014   TCO2 21 (L) 02/23/2018 1620   ACIDBASEDEF 6.0 (H) 02/23/2018 1014   O2SAT 91.0 02/23/2018 1014    CXR: n/a  Assessment/Plan: S/P Procedure(s) (LRB): AORTIC VALVE REPLACEMENT -Biological Bentall aortic root replacement, (N/A) THORACIC ASCENDING ANEURYSM REPAIR (AAA) Resection of ascending aorta aneurysm (N/A) TRANSESOPHAGEAL ECHOCARDIOGRAM (TEE) (N/A) CORONARY ARTERY BYPASS GRAFTING (CABG) x three, using left internal mammary artery and right leg greater saphenous vein harvested endoscopically (N/A)  Overall stable POD5 Maintaining NSR on oral amiodarone BP stable  O2 requirement improving Expected post op atelectasis,low lung volumes on previous CXR - needs increased mobility Post op elevated serum creatinineand acute non-oliguric renal failure- acute exacerbation of baseline CKD, likely due to prerenal azotemia +/- acute kidney injury caused by ATN, UOPexcellent and creatinine stable Acute on chronic diastolic CHF with expected post-op volume excess, diuresing well and weight down 6 lbs yesterday but still 5 lbs above baseline Expected post op acute blood loss anemia,Hgb stable 8.9 this morning Hypokalemia, induced by loop diuretics   Mobilize  Stop IV lasix today and resume low dose oral  Continue oral amiodarone   Coumadin, low dose ASA  Continue PT  Supplement potassium  Restart carvedilol at reduced dose  Transfer step down once oxygenation improved further   Rexene Alberts, MD 02/27/2018 7:30 AM

## 2018-02-27 NOTE — Progress Notes (Signed)
TC Surgery PM Rounds  Excellent day O2 weaned to 4L [ from 8L] with sat 99% nsr No complaints

## 2018-02-28 ENCOUNTER — Encounter
Admission: RE | Admit: 2018-02-28 | Discharge: 2018-02-28 | Disposition: A | Discharge status: Home / Self Care | Payer: Medicare HMO | Source: Ambulatory Visit | Attending: Internal Medicine | Admitting: Internal Medicine

## 2018-02-28 LAB — CBC
HCT: 25.8 % — ABNORMAL LOW (ref 39.0–52.0)
Hemoglobin: 8.6 g/dL — ABNORMAL LOW (ref 13.0–17.0)
MCH: 30.9 pg (ref 26.0–34.0)
MCHC: 33.3 g/dL (ref 30.0–36.0)
MCV: 92.8 fL (ref 78.0–100.0)
Platelets: 124 10*3/uL — ABNORMAL LOW (ref 150–400)
RBC: 2.78 MIL/uL — ABNORMAL LOW (ref 4.22–5.81)
RDW: 14.8 % (ref 11.5–15.5)
WBC: 7 10*3/uL (ref 4.0–10.5)

## 2018-02-28 LAB — PROTIME-INR
INR: 2.15
Prothrombin Time: 23.8 seconds — ABNORMAL HIGH (ref 11.4–15.2)

## 2018-02-28 LAB — RENAL FUNCTION PANEL
Albumin: 2.5 g/dL — ABNORMAL LOW (ref 3.5–5.0)
Anion gap: 13 (ref 5–15)
BUN: 67 mg/dL — ABNORMAL HIGH (ref 6–20)
CO2: 23 mmol/L (ref 22–32)
Calcium: 7.7 mg/dL — ABNORMAL LOW (ref 8.9–10.3)
Chloride: 105 mmol/L (ref 101–111)
Creatinine, Ser: 2.89 mg/dL — ABNORMAL HIGH (ref 0.61–1.24)
GFR calc Af Amer: 22 mL/min — ABNORMAL LOW (ref >60)
GFR calc non Af Amer: 19 mL/min — ABNORMAL LOW (ref >60)
Glucose, Bld: 91 mg/dL (ref 65–99)
Phosphorus: 3.8 mg/dL (ref 2.5–4.6)
Potassium: 4.1 mmol/L (ref 3.5–5.1)
Sodium: 141 mmol/L (ref 135–145)

## 2018-02-28 LAB — GLUCOSE, CAPILLARY
Glucose-Capillary: 117 mg/dL — ABNORMAL HIGH (ref 65–99)
Glucose-Capillary: 103 mg/dL — ABNORMAL HIGH (ref 65–99)

## 2018-02-28 MED ORDER — ORAL CARE MOUTH RINSE
15.0000 mL | Freq: Two times a day (BID) | OROMUCOSAL | Status: DC
  Administered 2018-02-28 – 2018-03-02 (×4): 15 mL via OROMUCOSAL

## 2018-02-28 MED ORDER — POTASSIUM CHLORIDE CRYS ER 20 MEQ PO TBCR
20.0000 meq | EXTENDED_RELEASE_TABLET | Freq: Every day | ORAL | Status: DC
  Administered 2018-02-28 – 2018-03-03 (×4): 20 meq via ORAL
  Filled 2018-02-28 (×4): qty 1

## 2018-02-28 MED FILL — Sodium Bicarbonate IV Soln 8.4%: INTRAVENOUS | Qty: 50 | Status: AC

## 2018-02-28 MED FILL — Heparin Sodium (Porcine) Inj 1000 Unit/ML: INTRAMUSCULAR | Qty: 10 | Status: AC

## 2018-02-28 MED FILL — Mannitol IV Soln 20%: INTRAVENOUS | Qty: 1000 | Status: AC

## 2018-02-28 MED FILL — Electrolyte-R (PH 7.4) Solution: INTRAVENOUS | Qty: 4000 | Status: AC

## 2018-02-28 MED FILL — Lidocaine HCl IV Inj 20 MG/ML: INTRAVENOUS | Qty: 25 | Status: AC

## 2018-02-28 MED FILL — Sodium Chloride IV Soln 0.9%: INTRAVENOUS | Qty: 2000 | Status: AC

## 2018-02-28 NOTE — Plan of Care (Signed)
Pt uses the BSC and ambulated x 2 during day shift w/ plans of ambulating in the morning. Pt is currently off pressure support and weaning off HFNC. Pt is currently on 2L (Pt was on 4 at beginning of shift).

## 2018-02-28 NOTE — Progress Notes (Signed)
Physical Therapy Treatment Patient Details Name: Rodney Wiley MRN: [id number] DOB: Apr 11, 1936 Today's Date: 02/28/2018    History of Present Illness Admitted for THORACIC ASCENDING ANEURYSM REPAIR (AAA) Resection of ascending aorta aneurysm (N/A); TEE, CABGx3;  has a past medical history of Arthropathy, Ascending aortic aneurysm ([place]), Cardiac murmur, Cardiomegaly, CKD (chronic kidney disease), stage III ([place]), Coronary artery disease involving native heart without angina pectoris, Essential hypertension;  has a past surgical history that includes Shoulder surgery (Right); Elbow surgery (Left)     PT Comments    Pt is progressing well with his gait and mobility, however, he still requires min assist for balance during gait and assist for in and out of bed while complying with his sternal precautions.  He lives alone and does not have assistance at discharge.  He remains appropriate for SNF level rehab.  PT will continue to follow acutely.   Follow Up Recommendations  SNF     Equipment Recommendations  3in1 (PT)    Recommendations for Other Services   NA     Precautions / Restrictions Precautions Precautions: Sternal Precaution Comments: He was able to report "stay in the tube", but needed some cues functionally.  Restrictions Weight Bearing Restrictions: Yes(sternal precautions)    Mobility  Bed Mobility Overal bed mobility: Needs Assistance Bed Mobility: Supine to Sit;Sit to Supine     Supine to sit: Min assist Sit to supine: Min assist   General bed mobility comments: Min assist to support trunk as he wanted a hand to pull and I reminded him that he could not push or pull hard with his hands.  He struggled to get EOB without pushing or pulling.  Min assist to help lift both legs back into bed to retrun to supine.  Verbal cues for technique hugging pillow and going down on his side.   Transfers Overall transfer level: Needs assistance Equipment used: None Transfers: Sit  to/from Stand Sit to Stand: Min assist         General transfer comment: Min assist to steady truk for balance during transitions.  Pt hugging pillow during transitions.  Ambulation/Gait Ambulation/Gait assistance: Min assist;+2 safety/equipment Ambulation Distance (Feet): (hallway ambulation) Assistive device: 1 person hand held assist Gait Pattern/deviations: Step-through pattern;Staggering left;Staggering right Gait velocity: decreased Gait velocity interpretation: Below normal speed for age/gender General Gait Details: Pt with mildly staggering gait pattern and needed min assist for multiple small LOB during gait.  HR and O2 sats stable on RA.         Balance Overall balance assessment: Needs assistance Sitting-balance support: Feet supported;No upper extremity supported Sitting balance-Leahy Scale: Good     Standing balance support: No upper extremity supported Standing balance-Leahy Scale: Good                              Cognition Arousal/Alertness: Awake/alert Behavior During Therapy: WFL for tasks assessed/performed Overall Cognitive Status: Within Functional Limits for tasks assessed                                               Pertinent Vitals/Pain Pain Assessment: Faces Faces Pain Scale: Hurts little more Pain Location: sternum with coughing Pain Descriptors / Indicators: Grimacing Pain Intervention(s): Limited activity within patient's tolerance;Monitored during session;Repositioned;Other (comment)(reminded him to hug his pillow )  PT Goals (current goals can now be found in the care plan section) Acute Rehab PT Goals Patient Stated Goal: to go to rehab so he can go home alone safely Progress towards PT goals: Progressing toward goals    Frequency    Min 2X/week      PT Plan Current plan remains appropriate       AM-PAC PT "6 Clicks" Daily Activity  Outcome Measure  Difficulty turning over in bed  (including adjusting bedclothes, sheets and blankets)?: Unable Difficulty moving from lying on back to sitting on the side of the bed? : Unable Difficulty sitting down on and standing up from a chair with arms (e.g., wheelchair, bedside commode, etc,.)?: Unable Help needed moving to and from a bed to chair (including a wheelchair)?: A Little Help needed walking in hospital room?: A Little Help needed climbing 3-5 steps with a railing? : A Little 6 Click Score: 12    End of Session   Activity Tolerance: Patient tolerated treatment well Patient left: in bed;with call bell/phone within reach Nurse Communication: Mobility status PT Visit Diagnosis: Unsteadiness on feet (R26.81);Difficulty in walking, not elsewhere classified (R26.2)     Time: 8242-3536 PT Time Calculation (min) (ACUTE ONLY): 9 min  Charges:  $Gait Training: 8-22 mins          Shuan Statzer B. Selda Jalbert, PT, DPT [phone]            02/28/2018, 1:32 PM

## 2018-02-28 NOTE — Clinical Social Work Note (Signed)
Clinical Social Work Assessment  Patient Details  Name: Rodney Wiley MRN: [id number] Date of Birth: 01-Jul-1936  Date of referral:  02/28/18               Reason for consult:  Facility Placement                Permission sought to share information with:  Facility Sport and exercise psychologist, Family Supports Permission granted to share information::  Yes, Verbal Permission Granted  Name::     Games developer::  SNF  Relationship::  son  Contact Information:     Housing/Transportation Living arrangements for the past 2 months:  Single Family Home Source of Information:  Patient Patient Interpreter Needed:  None Criminal Activity/Legal Involvement Pertinent to Current Situation/Hospitalization:  No - Comment as needed Significant Relationships:  Adult Children Lives with:  Self Do you feel safe going back to the place where you live?  No Need for family participation in patient care:  No (Coment)  Care giving concerns:  Pt lives at home alone- currently with increased care needs following surgery and would not have 24 hours assistance at home.   Social Worker assessment / plan:  CSW met with pt and pt son to confirm desire for SNF at time of DC. CSW explained SNF, SNF referral process, and need for prior approval from insurance for SNF stay.  Employment status:  Retired Nurse, adult PT Recommendations:  [place] / Referral to community resources:  [place]  Patient/Family's Response to care:  Pt agreeable to SNF and prefers [facility] which is near his home.  Patient/Family's Understanding of and Emotional Response to Diagnosis, Current Treatment, and Prognosis:  Pt very pleasant and optimistic regarding rehab and recovery potential.  Emotional Assessment Appearance:  Appears stated age Attitude/Demeanor/Rapport:    Affect (typically observed):  Appropriate, Pleasant Orientation:  Oriented to Self, Oriented  to Place, Oriented to  Time, Oriented to Situation Alcohol / Substance use:  Not Applicable Psych involvement (Current and /or in the community):  No (Comment)  Discharge Needs  Concerns to be addressed:  Care Coordination Readmission within the last 30 days:  No Current discharge risk:  Physical Impairment Barriers to Discharge:  Continued Medical Work up   Jorge Ny, LCSW 02/28/2018, 1:57 PM

## 2018-02-28 NOTE — Progress Notes (Signed)
CSW met with pt and pt son Todd concerning SNF- they are agreeable and want [facility]- [facility] has accepted and will start auth today  PT last visit was 3/24 and does not appear to be scheduled today- CSW left message for PT department to request someone see today will need updated note for insurance auth  Jenna H. Uris, LCSW Clinical Social Worker [phone]  

## 2018-02-28 NOTE — Progress Notes (Addendum)
      [address]       [place] [address]             [phone]        CARDIOTHORACIC SURGERY PROGRESS NOTE   R6 Days Post-Op Procedure(s) (LRB): AORTIC VALVE REPLACEMENT -Biological [place] aortic root replacement, (N/A) THORACIC ASCENDING ANEURYSM REPAIR (AAA) Resection of ascending aorta aneurysm (N/A) TRANSESOPHAGEAL ECHOCARDIOGRAM (TEE) (N/A) CORONARY ARTERY BYPASS GRAFTING (CABG) x three, using left internal mammary artery and right leg greater saphenous vein harvested endoscopically (N/A)  Subjective: Feels well.  Objective: Vital signs: BP Readings from Last 1 Encounters:  02/28/18 (!) 141/73   Pulse Readings from Last 1 Encounters:  02/28/18 75   Resp Readings from Last 1 Encounters:  02/28/18 (!) 26   Temp Readings from Last 1 Encounters:  02/28/18 98.1 F (36.7 C) (Oral)    Hemodynamics:    Physical Exam:  Rhythm:   sinus  Breath sounds: clear  Heart sounds:  RRR  Incisions:  Clean and dry  Abdomen:  Soft, non-distended, non-tender  Extremities:  Warm, well-perfused    Intake/Output from previous day: 03/25 0701 - 03/26 0700 In: 360 [P.O.:360] Out: 3125 [Urine:3125] Intake/Output this shift: Total I/O In: 360 [P.O.:360] Out: -   Lab Results:  CBC: Recent Labs    02/27/18 0244 02/28/18 0242  WBC 7.1 7.0  HGB 8.9* 8.6*  HCT 26.2* 25.8*  PLT 131* 124*    BMET:  Recent Labs    02/27/18 0244 02/28/18 0235  NA 138 141  K 3.0* 4.1  CL 101 105  CO2 25 23  GLUCOSE 105* 91  BUN 72* 67*  CREATININE 3.47* 2.89*  CALCIUM 7.6* 7.7*     PT/INR:   Recent Labs    02/28/18 0235  LABPROT 23.8*  INR 2.15    CBG (last 3)  Recent Labs    02/27/18 2202 02/28/18 0750 02/28/18 1204  GLUCAP 108* 117* 103*    ABG    Component Value Date/Time   PHART 7.370 02/23/2018 1014   PCO2ART 32.6 02/23/2018 1014   PO2ART 64.0 (L) 02/23/2018 1014   HCO3 18.8 (L) 02/23/2018 1014   TCO2 21 (L) 02/23/2018 1620   ACIDBASEDEF 6.0 (H) 02/23/2018 1014   O2SAT 91.0 02/23/2018 1014    CXR: n/a  Assessment/Plan: S/P Procedure(s) (LRB): AORTIC VALVE REPLACEMENT -Biological Bentall aortic root replacement, (N/A) THORACIC ASCENDING ANEURYSM REPAIR (AAA) Resection of ascending aorta aneurysm (N/A) TRANSESOPHAGEAL ECHOCARDIOGRAM (TEE) (N/A) CORONARY ARTERY BYPASS GRAFTING (CABG) x three, using left internal mammary artery and right leg greater saphenous vein harvested endoscopically (N/A)  Doing well POD5 Maintaining NSR on oral amiodarone BP stable  Breathing comfortably w/ O2 sats 95% on room air Post op elevated serum creatinineand acute non-oliguric renal failure- acute exacerbation of baseline CKD, likely due to prerenal azotemia +/- acute kidney injury caused by ATN, UOPexcellentand creatinine trending down  Acute on chronic diastolic CHF with expected post-op volume excess, diuresing well and weight down another 2 lbs yesterday but still 3 lbs above baseline Expected post op acute blood loss anemia,Hgbstable8.6this morning Hypokalemia, induced by loop diuretics, improved   Mobilize  Continue oral amiodarone   Continue oral lasix  Continue Coumadin, low dose ASA  Continue PT  Awaiting bed on 4E for transfer  Ready for d/c to SNF soon   Rexene Alberts, MD 02/28/2018 2:03 PM

## 2018-02-28 NOTE — Discharge Instructions (Addendum)
Aortic Valve Replacement, Care After °Refer to this sheet in the next few weeks. These instructions provide you with information about caring for yourself after your procedure. Your health care provider may also give you more specific instructions. Your treatment has been planned according to current medical practices, but problems sometimes occur. Call your health care provider if you have any problems or questions after your procedure. °What can I expect after the procedure? °After the procedure, it is common to have: °· Pain around your incision area. °· A small amount of blood or clear fluid coming from your incision. ° °Follow these instructions at home: °Eating and drinking ° °· Follow instructions from your health care provider about eating or drinking restrictions. °? Limit alcohol intake to no more than 1 drink per day for nonpregnant women and 2 drinks per day for men. One drink equals 12 oz of beer, 5 oz of wine, or 1½ oz of hard liquor. °? Limit how much caffeine you drink. Caffeine can affect your heart's rate and rhythm. °· Drink enough fluid to keep your urine clear or pale yellow. °· Eat a heart-healthy diet. This should include plenty of fresh fruits and vegetables. If you eat meat, it should be lean cuts. Avoid foods that are: °? High in salt, saturated fat, or sugar. °? Canned or highly processed. °? Fried. °Activity °· Return to your normal activities as told by your health care provider. Ask your health care provider what activities are safe for you. °· Exercise regularly once you have recovered, as told by your health care provider. °· Avoid sitting for more than 2 hours at a time without moving. Get up and move around at least once every 1-2 hours. This helps to prevent blood clots in the legs. °· Do not lift anything that is heavier than 10 lb (4.5 kg) until your health care provider approves. °· Avoid pushing or pulling things with your arms until your health care provider approves. This  includes pulling on handrails to help you climb stairs. °Incision care ° °· Follow instructions from your health care provider about how to take care of your incision. Make sure you: °? Wash your hands with soap and water before you change your bandage (dressing). If soap and water are not available, use hand sanitizer. °? Change your dressing as told by your health care provider. °? Leave stitches (sutures), skin glue, or adhesive strips in place. These skin closures may need to stay in place for 2 weeks or longer. If adhesive strip edges start to loosen and curl up, you may trim the loose edges. Do not remove adhesive strips completely unless your health care provider tells you to do that. °· Check your incision area every day for signs of infection. Check for: °? More redness, swelling, or pain. °? More fluid or blood. °? Warmth. °? Pus or a bad smell. °Medicines °· Take over-the-counter and prescription medicines only as told by your health care provider. °· If you were prescribed an antibiotic medicine, take it as told by your health care provider. Do not stop taking the antibiotic even if you start to feel better. °Travel °· Avoid airplane travel for as long as told by your health care provider. °· When you travel, bring a list of your medicines and a record of your medical history with you. Carry your medicines with you. °Driving °· Ask your health care provider when it is safe for you to drive. Do not drive until your health   care provider approves.  Do not drive or operate heavy machinery while taking prescription pain medicine. Lifestyle   Do not use any tobacco products, such as cigarettes, chewing tobacco, or e-cigarettes. If you need help quitting, ask your health care provider.  Resume sexual activity as told by your health care provider. Do not use medicines for erectile dysfunction unless your health care provider approves, if this applies.  Work with your health care provider to keep your  blood pressure and cholesterol under control, and to manage any other heart conditions that you have.  Maintain a healthy weight. General instructions  Do not take baths, swim, or use a hot tub until your health care provider approves.  Do not strain to have a bowel movement.  Avoid crossing your legs while sitting down.  Check your temperature every day for a fever. A fever may be a sign of infection.  If you are a woman and you plan to become pregnant, talk with your health care provider before you become pregnant.  Wear compression stockings if your health care provider instructs you to do this. These stockings help to prevent blood clots and reduce swelling in your legs.  Tell all health care providers who care for you that you have an artificial (prosthetic) aortic valve. If you have or have had heart disease or endocarditis, tell all health care providers about these conditions as well.  Keep all follow-up visits as told by your health care provider. This is important. Contact a health care provider if:  You develop a skin rash.  You experience sudden, unexplained changes in your weight.  You have more redness, swelling, or pain around your incision.  You have more fluid or blood coming from your incision.  Your incision feels warm to the touch.  You have pus or a bad smell coming from your incision.  You have a fever. Get help right away if:  You develop chest pain that is different from the pain coming from your incision.  You develop shortness of breath or difficulty breathing.  You start to feel light-headed. These symptoms may represent a serious problem that is an emergency. Do not wait to see if the symptoms will go away. Get medical help right away. Call your local emergency services (911 in the [place] Do not drive yourself to the hospital. This information is not intended to replace advice given to you by your health care provider. Make sure you discuss any  questions you have with your health care provider. Document Released: 06/10/2005 Document Revised: 04/29/2016 Document Reviewed: 10/26/2015 Elsevier Interactive Patient Education  2017 [employer] Coronary Artery Bypass Grafting, Care After These instructions give you information on caring for yourself after your procedure. Your doctor may also give you more specific instructions. Call your doctor if you have any problems or questions after your procedure. Follow these instructions at home:  Only take medicine as told by your doctor. Take medicines exactly as told. Do not stop taking medicines or start any new medicines without talking to your doctor first.  Take your pulse as told by your doctor.  Do deep breathing as told by your doctor. Use your breathing device (incentive spirometer), if given, to practice deep breathing several times a day. Support your chest with a pillow or your arms when you take deep breaths or cough.  Keep the area clean, dry, and protected where the surgery cuts (incisions) were made. Remove bandages (dressings) only as told by your doctor. If strips  were applied to surgical area, do not take them off. They fall off on their own.  Check the surgery area daily for puffiness (swelling), redness, or leaking fluid.  If surgery cuts were made in your legs: ? Avoid crossing your legs. ? Avoid sitting for long periods of time. Change positions every 30 minutes. ? Raise your legs when you are sitting. Place them on pillows.  Wear stockings that help keep blood clots from forming in your legs (compression stockings).  Only take sponge baths until your doctor says it is okay to take showers. Pat the surgery area dry. Do not rub the surgery area with a washcloth or towel. Do not bathe, swim, or use a hot tub until your doctor says it is okay.  Eat foods that are high in fiber. These include raw fruits and vegetables, whole grains, beans, and nuts. Choose lean meats.  Avoid canned, processed, and fried foods.  Drink enough fluids to keep your pee (urine) clear or pale yellow.  Weigh yourself every day.  Rest and limit activity as told by your doctor. You may be told to: ? Stop any activity if you have chest pain, shortness of breath, changes in heartbeat, or dizziness. Get help right away if this happens. ? Move around often for short amounts of time or take short walks as told by your doctor. Gradually become more active. You may need help to strengthen your muscles and build endurance. ? Avoid lifting, pushing, or pulling anything heavier than 10 pounds (4.5 kg) for at least 6 weeks after surgery.  Do not drive until your doctor says it is okay.  Ask your doctor when you can go back to work.  Ask your doctor when you can begin sexual activity again.  Follow up with your doctor as told. Contact a doctor if:  You have puffiness, redness, more pain, or fluid draining from the incision site.  You have a fever.  You have puffiness in your ankles or legs.  You have pain in your legs.  You gain 2 or more pounds (0.9 kg) a day.  You feel sick to your stomach (nauseous) or throw up (vomit).  You have watery poop (diarrhea). Get help right away if:  You have chest pain that goes to your jaw or arms.  You have shortness of breath.  You have a fast or irregular heartbeat.  You notice a "clicking" in your breastbone when you move.  You have numbness or weakness in your arms or legs.  You feel dizzy or light-headed. This information is not intended to replace advice given to you by your health care provider. Make sure you discuss any questions you have with your health care provider. Document Released: 11/27/2013 Document Revised: 04/29/2016 Document Reviewed: 05/01/2013 Elsevier Interactive Patient Education  2017 [place] on my medicine - Coumadin   (Warfarin)  This medication education was reviewed with me or my  healthcare representative as part of my discharge preparation.  The pharmacist that spoke with me during my hospital stay was:  Dareen Piano, [facility]  Why was Coumadin prescribed for you? Coumadin was prescribed for you because you have a blood clot or a medical condition that can cause an increased risk of forming blood clots. Blood clots can cause serious health problems by blocking the flow of blood to the heart, lung, or brain. Coumadin can prevent harmful blood clots from forming. As a reminder your indication for Coumadin is:   Blood Clot Prevention  After Heart Valve Surgery  What test will check on my response to Coumadin? While on Coumadin (warfarin) you will need to have an INR test regularly to ensure that your dose is keeping you in the desired range. The INR (international normalized ratio) number is calculated from the result of the laboratory test called prothrombin time (PT).  If an INR APPOINTMENT HAS NOT ALREADY BEEN MADE FOR YOU please schedule an appointment to have this lab work done by your health care provider within 7 days. Your INR goal is usually a number between:  2 to 3 or your provider may give you a more narrow range like 2-2.5.  Ask your health care provider during an office visit what your goal INR is.  What  do you need to  know  About  COUMADIN? Take Coumadin (warfarin) exactly as prescribed by your healthcare provider about the same time each day.  DO NOT stop taking without talking to the doctor who prescribed the medication.  Stopping without other blood clot prevention medication to take the place of Coumadin may increase your risk of developing a new clot or stroke.  Get refills before you run out.  What do you do if you miss a dose? If you miss a dose, take it as soon as you remember on the same day then continue your regularly scheduled regimen the next day.  Do not take two doses of Coumadin at the same time.  Important Safety Information A possible side  effect of Coumadin (Warfarin) is an increased risk of bleeding. You should call your healthcare provider right away if you experience any of the following: ? Bleeding from an injury or your nose that does not stop. ? Unusual colored urine (red or dark brown) or unusual colored stools (red or black). ? Unusual bruising for unknown reasons. ? A serious fall or if you hit your head (even if there is no bleeding).  Some foods or medicines interact with Coumadin (warfarin) and might alter your response to warfarin. To help avoid this: ? Eat a balanced diet, maintaining a consistent amount of Vitamin K. ? Notify your provider about major diet changes you plan to make. ? Avoid alcohol or limit your intake to 1 drink for women and 2 drinks for men per day. (1 drink is 5 oz. wine, 12 oz. beer, or 1.5 oz. liquor.)  Make sure that ANY health care provider who prescribes medication for you knows that you are taking Coumadin (warfarin).  Also make sure the healthcare provider who is monitoring your Coumadin knows when you have started a new medication including herbals and non-prescription products.  Coumadin (Warfarin)  Major Drug Interactions  Increased Warfarin Effect Decreased Warfarin Effect  Alcohol (large quantities) Antibiotics (esp. Septra/Bactrim, Flagyl, Cipro) Amiodarone (Cordarone) Aspirin (ASA) Cimetidine (Tagamet) Megestrol (Megace) NSAIDs (ibuprofen, naproxen, etc.) Piroxicam (Feldene) Propafenone (Rythmol SR) Propranolol (Inderal) Isoniazid (INH) Posaconazole (Noxafil) Barbiturates (Phenobarbital) Carbamazepine (Tegretol) Chlordiazepoxide (Librium) Cholestyramine (Questran) Griseofulvin Oral Contraceptives Rifampin Sucralfate (Carafate) Vitamin K   Coumadin (Warfarin) Major Herbal Interactions  Increased Warfarin Effect Decreased Warfarin Effect  Garlic Ginseng Ginkgo biloba Coenzyme Q10 Green tea St. Johns wort    Coumadin (Warfarin) FOOD Interactions  Eat a  consistent number of servings per week of foods HIGH in Vitamin K (1 serving =  cup)  Collards (cooked, or boiled & drained) Kale (cooked, or boiled & drained) Mustard greens (cooked, or boiled & drained) Parsley *serving size only =  cup Spinach (cooked, or boiled &  drained) Swiss chard (cooked, or boiled & drained) Turnip greens (cooked, or boiled & drained)  Eat a consistent number of servings per week of foods MEDIUM-HIGH in Vitamin K (1 serving = 1 cup)  Asparagus (cooked, or boiled & drained) Broccoli (cooked, boiled & drained, or raw & chopped) Brussel sprouts (cooked, or boiled & drained) *serving size only =  cup Lettuce, raw (green leaf, endive, romaine) Spinach, raw Turnip greens, raw & chopped   These websites have more information on Coumadin (warfarin):  FailFactory.se; VeganReport.com.au;

## 2018-03-01 ENCOUNTER — Inpatient Hospital Stay (HOSPITAL_COMMUNITY): Payer: Medicare HMO

## 2018-03-01 LAB — RENAL FUNCTION PANEL
Albumin: 2.5 g/dL — ABNORMAL LOW (ref 3.5–5.0)
Anion gap: 14 (ref 5–15)
BUN: 52 mg/dL — ABNORMAL HIGH (ref 6–20)
CO2: 24 mmol/L (ref 22–32)
Calcium: 7.8 mg/dL — ABNORMAL LOW (ref 8.9–10.3)
Chloride: 101 mmol/L (ref 101–111)
Creatinine, Ser: 2.44 mg/dL — ABNORMAL HIGH (ref 0.61–1.24)
GFR calc Af Amer: 27 mL/min — ABNORMAL LOW (ref >60)
GFR calc non Af Amer: 24 mL/min — ABNORMAL LOW (ref >60)
Glucose, Bld: 113 mg/dL — ABNORMAL HIGH (ref 65–99)
Phosphorus: 3.4 mg/dL (ref 2.5–4.6)
Potassium: 3.7 mmol/L (ref 3.5–5.1)
Sodium: 139 mmol/L (ref 135–145)

## 2018-03-01 LAB — PROTIME-INR
INR: 1.91
Prothrombin Time: 21.7 seconds — ABNORMAL HIGH (ref 11.4–15.2)

## 2018-03-01 MED ORDER — WARFARIN SODIUM 2 MG PO TABS: 2.0000 mg | ORAL_TABLET | Freq: Every day | ORAL | Status: DC

## 2018-03-01 MED ORDER — AMLODIPINE BESYLATE 10 MG PO TABS
10.0000 mg | ORAL_TABLET | Freq: Every day | ORAL | Status: DC
  Administered 2018-03-01 – 2018-03-03 (×3): 10 mg via ORAL
  Filled 2018-03-01 (×3): qty 1

## 2018-03-01 MED ORDER — POTASSIUM CHLORIDE CRYS ER 20 MEQ PO TBCR
40.0000 meq | EXTENDED_RELEASE_TABLET | Freq: Once | ORAL | Status: AC
  Administered 2018-03-01: 40 meq via ORAL
  Filled 2018-03-01: qty 2

## 2018-03-01 MED ORDER — CARVEDILOL 6.25 MG PO TABS
6.2500 mg | ORAL_TABLET | Freq: Two times a day (BID) | ORAL | Status: DC
  Administered 2018-03-01 – 2018-03-03 (×5): 6.25 mg via ORAL
  Filled 2018-03-01 (×5): qty 1

## 2018-03-01 MED ORDER — FUROSEMIDE 40 MG PO TABS
40.0000 mg | ORAL_TABLET | Freq: Every day | ORAL | Status: DC
  Administered 2018-03-01 – 2018-03-03 (×3): 40 mg via ORAL
  Filled 2018-03-01 (×3): qty 1

## 2018-03-01 MED ORDER — LEVALBUTEROL HCL 0.63 MG/3ML IN NEBU
0.6300 mg | INHALATION_SOLUTION | Freq: Three times a day (TID) | RESPIRATORY_TRACT | Status: DC
  Administered 2018-03-01 – 2018-03-03 (×8): 0.63 mg via RESPIRATORY_TRACT
  Filled 2018-03-01 (×8): qty 3

## 2018-03-01 MED ORDER — ENALAPRIL MALEATE 10 MG PO TABS
10.0000 mg | ORAL_TABLET | Freq: Two times a day (BID) | ORAL | Status: DC
  Administered 2018-03-01 – 2018-03-03 (×5): 10 mg via ORAL
  Filled 2018-03-01 (×5): qty 1

## 2018-03-01 MED ORDER — WARFARIN SODIUM 4 MG PO TABS
4.0000 mg | ORAL_TABLET | Freq: Every day | ORAL | Status: DC
  Administered 2018-03-01 – 2018-03-02 (×2): 4 mg via ORAL
  Filled 2018-03-01 (×2): qty 1

## 2018-03-01 NOTE — Progress Notes (Addendum)
[address]       [place] [address]             [phone]      7 Days Post-Op Procedure(s) (LRB): AORTIC VALVE REPLACEMENT -Biological Bentall aortic root replacement, (N/A) THORACIC ASCENDING ANEURYSM REPAIR (AAA) Resection of ascending aorta aneurysm (N/A) TRANSESOPHAGEAL ECHOCARDIOGRAM (TEE) (N/A) CORONARY ARTERY BYPASS GRAFTING (CABG) x three, using left internal mammary artery and right leg greater saphenous vein harvested endoscopically (N/A) Subjective: Some DOE with productive cough Minor pain Easily fatigued  Objective: Vital signs in last 24 hours: Temp:  [98.3 F (36.8 C)-99.7 F (37.6 C)] 98.3 F (36.8 C) (03/27 0306) Pulse Rate:  [75-89] 76 (03/27 0400) Cardiac Rhythm: Normal sinus rhythm (03/26 2030) Resp:  [19-29] 19 (03/27 0400) BP: (132-161)/(73-95) 133/74 (03/27 0400) SpO2:  [91 %-93 %] 91 % (03/27 0400) Weight:  [177 lb 9.6 oz (80.6 kg)] 177 lb 9.6 oz (80.6 kg) (03/27 0306)  Hemodynamic parameters for last 24 hours:    Intake/Output from previous day: 03/26 0701 - 03/27 0700 In: 360 [P.O.:360] Out: 550 [Urine:550] Intake/Output this shift: No intake/output data recorded.  General appearance: alert, cooperative and no distress Heart: regular rate and rhythm Lungs: slightly coarse , some scatteredexp wheeze Abdomen: benign Extremities: minor edema Wound: incis healing well  Lab Results: Recent Labs    02/27/18 0244 02/28/18 0242  WBC 7.1 7.0  HGB 8.9* 8.6*  HCT 26.2* 25.8*  PLT 131* 124*   BMET:  Recent Labs    02/28/18 0235 03/01/18 0241  NA 141 139  K 4.1 3.7  CL 105 101  CO2 23 24  GLUCOSE 91 113*  BUN 67* 52*  CREATININE 2.89* 2.44*  CALCIUM 7.7* 7.8*    PT/INR:  Recent Labs    03/01/18 0241  LABPROT 21.7*  INR 1.91   ABG    Component Value Date/Time   PHART 7.370 02/23/2018 1014   HCO3 18.8 (L) 02/23/2018 1014   TCO2 21 (L) 02/23/2018 1620   ACIDBASEDEF 6.0 (H) 02/23/2018 1014   O2SAT 91.0 02/23/2018 1014   CBG (last 3)  Recent Labs    02/27/18 2202 02/28/18 0750 02/28/18 1204  GLUCAP 108* 117* 103*    Meds Scheduled Meds: . amiodarone  200 mg Oral BID PC  . aspirin EC  81 mg Oral Daily  . atorvastatin  20 mg Oral Daily  . bisacodyl  10 mg Oral Daily   Or  . bisacodyl  10 mg Rectal Daily  . carvedilol  3.125 mg Oral BID  . Chlorhexidine Gluconate Cloth  6 each Topical Daily  . docusate sodium  200 mg Oral Daily  . doxazosin  2 mg Oral QHS  . ferrous sulfate  325 mg Oral Q breakfast  . folic acid-pyridoxine-cyancobalamin  1 tablet Oral Daily  . furosemide  40 mg Oral Daily  . gabapentin  100 mg Oral BID  . mouth rinse  15 mL Mouth Rinse BID  . pantoprazole  40 mg Oral Daily  . potassium chloride  20 mEq Oral Daily  . potassium chloride  40 mEq Oral Once  . sodium chloride flush  10-40 mL Intracatheter Q12H  . sodium chloride flush  3 mL Intravenous Q12H  . warfarin  2 mg Oral q1800  . Warfarin - Physician Dosing Inpatient   Does not apply q1800   Continuous Infusions: PRN Meds:.metoprolol tartrate, morphine injection, ondansetron (ZOFRAN) IV, oxyCODONE, sodium chloride flush, sodium chloride flush,  traMADol  Xrays No results found.  Assessment/Plan: S/P Procedure(s) (LRB): AORTIC VALVE REPLACEMENT -Biological Bentall aortic root replacement, (N/A) THORACIC ASCENDING ANEURYSM REPAIR (AAA) Resection of ascending aorta aneurysm (N/A) TRANSESOPHAGEAL ECHOCARDIOGRAM (TEE) (N/A) CORONARY ARTERY BYPASS GRAFTING (CABG) x three, using left internal mammary artery and right leg greater saphenous vein harvested endoscopically (N/A)  1 conts to overall progress nicely 2 sinus with episodes of rate controlled Afib and some PVC;s including bigemminy- will cont K+ replacement= conts amio, QTc 453, will increase beta blocker a little 3 CXR is showing improving aeration, will add xopenex for bronchospasm and congestion 4 renal fxn conts to improve, cont  gentle diuresis 5 SW assisting with placement 6 increase coumadin dose a little 7 cont rehab/PT  LOS: 7 days    Rodney Wiley 03/01/2018  I have seen and examined the patient and agree with the assessment and plan as outlined.  Will restart Norvasc for hypertension.  Possibly ready for D/C to SNF 1-2 days  Rexene Alberts, MD 03/01/2018 8:26 AM

## 2018-03-01 NOTE — Plan of Care (Signed)
  Problem: Education: Goal: Knowledge of General Education information will improve Outcome: Progressing   Problem: Health Behavior/Discharge Planning: Goal: Ability to manage health-related needs will improve Outcome: Progressing   Problem: Clinical Measurements: Goal: Ability to maintain clinical measurements within normal limits will improve Outcome: Progressing Goal: Will remain free from infection Outcome: Progressing   

## 2018-03-01 NOTE — Care Management Important Message (Signed)
Important Message  Patient Details  Name: Rodney Wiley MRN: [id number] Date of Birth: 11-20-1936   Medicare Important Message Given:  Yes    [name] [name] 03/01/2018, 2:24 PM

## 2018-03-01 NOTE — Discharge Summary (Addendum)
Physician Discharge Summary  Patient ID: Rodney Wiley MRN: [id number] DOB/AGE: March 03, 1936 82 y.o.  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 S/P CABG x 3 02/22/2018  . S/P ascending aortic aneurysm repair 02/22/2018  . Coronary artery disease involving native heart without angina pectoris   . 1st degree AV block 12/21/2017  . Stage 3 chronic kidney disease ([place]) 03/22/2017  . Thoracic aortic aneurysm ([place]) 01/27/2017  . Renal artery stenosis ([place]) 01/27/2017  . Hyperlipidemia LDL goal <70 01/27/2017  . Aortic regurgitation 10/27/2016  . Mitral regurgitation 10/27/2016  . Essential hypertension 02/08/2007  . HYPERTENSION, BENIGN SYSTEMIC 02/02/2007  HPI Patient is a 82 year old male with history of aortic insufficiency and ascending thoracic aortic aneurysmwho returns to the office today with tentative plans to proceed with aortic valve repair or replacement and resection and grafting of a descending thoracic aortic aneurysm on February 22, 2018. He was originally seen in consultation on September 05, 2017. Previous echocardiograms revealed at least moderate if not severe aortic insufficiency with normal left  ventricular systolic function. MR angiograms of the thoracic aorta revealed annual aortic ectasia with fusiform aneurysmal enlargement of the aortic root and ascending thoracic aorta with maximum transverse diameter approaching 5 cm. At that time the patient reported stable symptoms of mild exertional shortness of breath and chronic bilateral lower extremity edema consistent with chronic diastolic congestive heart failure, New York Heart Association functional class 1-2.He underwent transesophageal echocardiogram on September 27, 2017. Left ventricular function appeared normal with ejection fraction estimated 60-65%, although the left ventricle was "mildly dilated".End-diastolic and end-systolic diameters of the left ventricle were not reported. 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 The aortic valve was trileaflet with incomplete coaptation and at least moderate if not severe aortic insufficiency. The diastolic pressure halftime was reported 360 msand vena contractawidth 6.9 mm.He states that over the past 6 months he has developed worsening symptoms of exertional shortness of breath. He denies any resting shortness of breath, PND, orthopnea, or lower extremity edema. He gets tired more easily than he used to. He has never had any chest pain or chest tightness either with activity or at rest. I saw him on follow-up on January 11, 2018 and we made tentative plans to proceed with surgery later this month. Since then he underwent diagnostic cardiac catheterization by Dr. Saunders Revel on January 16, 2018. He was found to have severe three-vessel coronary artery disease with mildly elevated left and right heart filling pressures. He returns to our office today with tentative plans to proceed with surgery on February 22, 2018. The remainder of his review of systems is unrevealing.  After full review of the patient's studies and examination  of the  patient the patient was felt to be a candidate for proceeding with surgery.  He was admitted this hospitalization for the procedure.    Discharged Condition: good  Hospital Course: Patient was admitted electively and taken to the operating room at which time he underwent the below described procedure.  He tolerated it well was taken to the surgical intensive care unit in stable condition.  Postoperative hospital course:  Patient is overall progressed nicely.  Initially did require some AV pacing with low-dose dopamine and Neo-Synephrine.  Were all weaned off without difficulty.  He did develop postoperative atrial fibrillation but subsequently been chemically cardioverted with sinus rhythm developing on amiodarone and beta-blocker.  He was weaned from the ventilator on postoperative day #1 without significant difficulty.  He did develop a postoperative acute blood loss anemia which is stabilized.  His most recent hemoglobin and hematocrit are 8.6 and 24.8.  He developed a postoperative thrombocytopenia which is stable with most recent platelet count 124,000.  He has been started on Coumadin.  He developed an acute renal insufficiency which is felt to be multifactorial but is also improving over time.  He does have volume overload but is responding to diuretics.  His most recent  BUN and creatinine are at 52 and 2.44 which are trending improvement over time after a peak creatinine of 3.47.  He does make excellent urine.  Blood sugars are under adequate control using standard measures.  His recent INR is 3.01 .He is showing a steady clinical improvement in his rehabilitation but is felt to benefit from short term skilled nursing facility stay for rehab and is being arranged through social work.  At the time of discharge the patient is felt to be quite stable.  He does have some bronchospasm with wheezing and has been placed on Xopenex nebs with good improvement.  He should continue this at the skilled  nursing facility for at least a couple more days depending on clinical course.  Consults: None  Significant Diagnostic Studies:Routine postoperative serial chest x-rays and labs  Treatments: surgery:  CARDIOTHORACIC SURGERY OPERATIVE NOTE  Date of Procedure:                02/22/2018  Preoperative Diagnosis:       ? Aortic Root Aneurysm ? Ascending Thoracic Aortic Aneurysm ? Moderate-Severe Aortic Insufficiency ? Severe 3-vessel Coronary Artery Disease  Postoperative Diagnosis:    Same  Procedure:        Biological Bentall Aortic Root Replacement             Edwards Inspiris Resilia Stented Bovine Pericardial Tissue Valve (size 21 mm, model # 11500A, serial # [id number])             Vascutek Gelweave Valsalva synthetic root graft (size 24 mm, ref # [id number], serial #[id number])   Repair Ascending Thoracic Aortic Aneurysm             Right axillary artery cannulation             Straight graft replacement of ascending thoracic aorta   Coronary Artery Bypass Grafting x 3             Left Internal Mammary Artery to Distal Left Anterior Descending Coronary Artery             Saphenous Vein Graft to Posterior Descending Coronary Artery             Saphenous Vein Graft to Second Obtuse Marginal Branch of Left  Circumflex Coronary Artery             Endoscopic Vein Harvest from Right Thigh and Lower Leg  Surgeon:        Valentina Gu. Roxy Manns, MD  Assistant:       Gaye Pollack, MD and John Giovanni, PA-C  Anesthesia:    Roberts Gaudy, MD  Operative Findings:  Aortic root aneurysm  Ascending thoracic aortic aneurysm  Moderate-severe (3+) aortic insufficiency  Mild inferior LV hypokinesis with otherwise normal LV systolic function  Good quality LIMA conduit for grafting  Good quality SVG conduit for grafting  Good quality target vessels for grafting    Discharge Exam: Blood pressure 123/75, pulse (!) 108, temperature 98.4 F (36.9 C), temperature source  Oral, resp. rate 18, height 5\' 7"  (1.702 m), weight 175 lb 6.4 oz (79.6 kg), SpO2 97 %.   General appearance: alert, cooperative and no distress Heart: regular rate and rhythm and soft syst murmur, occ extrasystole Lungs: scattered exp wheeze Abdomen: benign Extremities: + ankle edema Wound: incis healing well   Disposition: Discharge disposition: 03-Skilled Nursing Facility       Discharge Instructions    Amb Referral to Cardiac Rehabilitation   Complete by:  As directed    Diagnosis:   CABG Valve Replacement     Valve:  Aortic   CABG X ___:  3   Discharge patient   Complete by:  As directed    Discharge disposition:  03-Skilled [facility] [facility]   Discharge patient date:  03/03/2018     Allergies as of 03/03/2018   No Known Allergies     Medication List    STOP taking these medications   acetaminophen 500 MG tablet Commonly known as:  TYLENOL   amLODipine 10 MG tablet Commonly known as:  NORVASC   aspirin EC 81 MG tablet   furosemide 40 MG tablet Commonly known as:  LASIX     TAKE these medications   amiodarone 200 MG tablet Commonly known as:  PACERONE Take 1 tablet (200 mg total) by mouth 2 (two) times daily after a meal.   atorvastatin 20 MG tablet Commonly known as:  LIPITOR Take 20 mg by mouth daily.   carvedilol 6.25 MG tablet Commonly known as:  COREG Take 1 tablet (6.25 mg total) by mouth 2 (two) times daily with a meal. What changed:    medication strength  how much to take  when to take this   dextromethorphan-guaiFENesin 30-600 MG 12hr tablet Commonly known as:  MUCINEX DM Take 1 tablet by mouth 2 (two) times daily.   doxazosin 2 MG tablet Commonly known as:  CARDURA Take 1 tablet (2 mg total) by mouth daily. What changed:  when to take this   enalapril 10 MG tablet Commonly known as:  VASOTEC Take 1 tablet (10 mg total) by mouth 2 (two) times daily. What changed:    medication strength  how much to take   ferrous  sulfate 325 (65 FE) MG EC tablet Take 325 mg by mouth daily with breakfast.   folic acid-pyridoxine-cyancobalamin 2.5-25-2 MG Tabs tablet Commonly known as:  FOLTX Take 1 tablet by mouth daily. Start taking on:  03/04/2018   gabapentin 100 MG capsule Commonly known as:  NEURONTIN Take 100 mg by mouth 2 (two) times daily.   levalbuterol 0.63 MG/3ML nebulizer solution Commonly known as:  XOPENEX Take 3 mLs (0.63 mg total) by nebulization 3 (three) times daily.   oxyCODONE 5 MG immediate  release tablet Commonly known as:  Oxy IR/ROXICODONE Take 1-2 tablets (5-10 mg total) by mouth every 6 (six) hours as needed for severe pain.   Vitamin D3 2000 units capsule Take 2,000 Units by mouth daily.   warfarin 1 MG tablet Commonly known as:  COUMADIN Take 1 tablet (1 mg total) by mouth daily at 6 PM.       Contact information for follow-up providers    Rexene Alberts, MD Follow up.   Specialty:  Cardiothoracic Surgery Why:  Appointment to see Dr. Roxy Manns on 03/27/2018 at 12:30 PM.  Please obtain a chest x-ray at [facility] at 12 noon.  [facility] is located in the same office complex on the first floor. Contact information: [address] [address] [phone]        Nelva Bush, MD Follow up.   Specialty:  Cardiology Why:  See discharge instructions paperwork for follow-up appointment with cardiology. Contact information: [place] [address] [phone]        Nelva Bush, MD .   Specialty:  Cardiology Contact information: [place] [address] [phone]            Contact information for after-discharge care    [facility] .   Service:  Skilled Nursing Contact information: [address] [place] [phone]                 The patient has been discharged on:   1.Beta Blocker:  Yes  [ y  ]                              No   [   ]                              If No, reason:  2.Ace Inhibitor/ARB: Yes [ y ]                                     No  [    ]                                     If No, reason:  3.Statin:   Yes [  y ]                  No  [   ]                  If No, reason:  4.Ecasa:  Yes  Blue.Reese   ]                  No   [   ]                  if No, reason:   1. Please obtain vital signs at least one time daily 2.Please weigh the patient daily. If he or she continues to gain weight or develops lower extremity edema, contact the office at [phone]. 3. Ambulate patient at least three times daily and please use sternal precautions. Physical therapy if possible 4 The patient will  need INR/PT checked to obtain 2.0-2.5 range.  After discharge from the skilled nursing facility arrangements will need to be made for follow-up at [facility] Coumadin clinic. 5 encourage the patient to use incentive spirometry in the same manner as he used it in the hospital.   Signed: John Giovanni 03/03/2018, 12:06 PM

## 2018-03-01 NOTE — Progress Notes (Signed)
CARDIAC REHAB PHASE I   PRE:  Rate/Rhythm: 72 SR  BP:  Supine:   Sitting: 125/76  Standing:    SaO2: 95%RA  MODE:  Ambulation: 470 ft   POST:  Rate/Rhythm: 88 SR  BP:  Supine:   Sitting: 137/79  Standing:    SaO2: 95%RA 1010-1045 Pt walked 470 ft on RA with rolling walker and minimal asst with steady gait and tolerated well. Encouraged walks and IS as pt stated he has been wheezing. To recliner with call bell. Discussed CRP 2 and referring to [facility] program.   Graylon Good, RN BSN  03/01/2018 10:43 AM

## 2018-03-02 LAB — RENAL FUNCTION PANEL
Albumin: 2.5 g/dL — ABNORMAL LOW (ref 3.5–5.0)
Anion gap: 10 (ref 5–15)
BUN: 43 mg/dL — ABNORMAL HIGH (ref 6–20)
CO2: 25 mmol/L (ref 22–32)
Calcium: 7.8 mg/dL — ABNORMAL LOW (ref 8.9–10.3)
Chloride: 103 mmol/L (ref 101–111)
Creatinine, Ser: 2.33 mg/dL — ABNORMAL HIGH (ref 0.61–1.24)
GFR calc Af Amer: 29 mL/min — ABNORMAL LOW (ref >60)
GFR calc non Af Amer: 25 mL/min — ABNORMAL LOW (ref >60)
Glucose, Bld: 114 mg/dL — ABNORMAL HIGH (ref 65–99)
Phosphorus: 3.3 mg/dL (ref 2.5–4.6)
Potassium: 3.9 mmol/L (ref 3.5–5.1)
Sodium: 138 mmol/L (ref 135–145)

## 2018-03-02 LAB — PROTIME-INR
INR: 2.09
Prothrombin Time: 23.3 seconds — ABNORMAL HIGH (ref 11.4–15.2)

## 2018-03-02 NOTE — Progress Notes (Addendum)
[address]       [place] [address]             [phone]      8 Days Post-Op Procedure(s) (LRB): AORTIC VALVE REPLACEMENT -Biological Bentall aortic root replacement, (N/A) THORACIC ASCENDING ANEURYSM REPAIR (AAA) Resection of ascending aorta aneurysm (N/A) TRANSESOPHAGEAL ECHOCARDIOGRAM (TEE) (N/A) CORONARY ARTERY BYPASS GRAFTING (CABG) x three, using left internal mammary artery and right leg greater saphenous vein harvested endoscopically (N/A) Subjective: Feels better today, feels as though breathing treatments are helping  Objective: Vital signs in last 24 hours: Temp:  [97.9 F (36.6 C)-99.4 F (37.4 C)] 97.9 F (36.6 C) (03/28 0359) Pulse Rate:  [70-80] 80 (03/28 0359) Cardiac Rhythm: Normal sinus rhythm;Heart block (03/27 2000) Resp:  [25-27] 25 (03/28 0359) BP: (133-146)/(69-79) 133/69 (03/28 0359) SpO2:  [91 %-97 %] 96 % (03/28 0359) FiO2 (%):  [21 %] 21 % (03/27 1357) Weight:  [174 lb 9.6 oz (79.2 kg)] 174 lb 9.6 oz (79.2 kg) (03/28 0359)  Hemodynamic parameters for last 24 hours:    Intake/Output from previous day: 03/27 0701 - 03/28 0700 In: 253 [P.O.:250; I.V.:3] Out: 900 [Urine:900] Intake/Output this shift: No intake/output data recorded.  General appearance: alert, cooperative and no distress Heart: regular rate and rhythm and soft systolic murmur Lungs: + wheeze(exp) , ronchi- improves with cough Abdomen: benign Extremities: min edema Wound: incis healing well  Lab Results: Recent Labs    02/28/18 0242  WBC 7.0  HGB 8.6*  HCT 25.8*  PLT 124*   BMET:  Recent Labs    03/01/18 0241 03/02/18 0324  NA 139 138  K 3.7 3.9  CL 101 103  CO2 24 25  GLUCOSE 113* 114*  BUN 52* 43*  CREATININE 2.44* 2.33*  CALCIUM 7.8* 7.8*    PT/INR:  Recent Labs    03/02/18 0324  LABPROT 23.3*  INR 2.09   ABG    Component Value Date/Time   PHART 7.370 02/23/2018 1014   HCO3 18.8 (L) 02/23/2018 1014   TCO2 21 (L)  02/23/2018 1620   ACIDBASEDEF 6.0 (H) 02/23/2018 1014   O2SAT 91.0 02/23/2018 1014   CBG (last 3)  Recent Labs    02/27/18 2202 02/28/18 0750 02/28/18 1204  GLUCAP 108* 117* 103*    Meds Scheduled Meds: . amiodarone  200 mg Oral BID PC  . amLODipine  10 mg Oral Daily  . aspirin EC  81 mg Oral Daily  . atorvastatin  20 mg Oral Daily  . bisacodyl  10 mg Oral Daily   Or  . bisacodyl  10 mg Rectal Daily  . carvedilol  6.25 mg Oral BID WC  . Chlorhexidine Gluconate Cloth  6 each Topical Daily  . docusate sodium  200 mg Oral Daily  . doxazosin  2 mg Oral QHS  . enalapril  10 mg Oral BID  . ferrous sulfate  325 mg Oral Q breakfast  . folic acid-pyridoxine-cyancobalamin  1 tablet Oral Daily  . furosemide  40 mg Oral Daily  . gabapentin  100 mg Oral BID  . levalbuterol  0.63 mg Nebulization TID  . mouth rinse  15 mL Mouth Rinse BID  . pantoprazole  40 mg Oral Daily  . potassium chloride  20 mEq Oral Daily  . sodium chloride flush  3 mL Intravenous Q12H  . warfarin  4 mg Oral q1800  . Warfarin - Physician Dosing Inpatient   Does not apply [phone]  Continuous Infusions: PRN Meds:.metoprolol tartrate, morphine injection, ondansetron (ZOFRAN) IV, oxyCODONE, traMADol  Xrays Dg Chest 2 View  Result Date: 03/01/2018 CLINICAL DATA:  Recent aortic valve replacement.  Pleural effusion. EXAM: CHEST - 2 VIEW COMPARISON:  February 25, 2018 FINDINGS: There is a small pleural effusion on the left. There is patchy consolidation in the medial lung bases. Lungs elsewhere clear. No pneumothorax. Heart is borderline enlarged with pulmonary vascularity within normal limits. Patient is status post coronary artery bypass grafting and aortic valve replacement. No adenopathy. No bone lesions. IMPRESSION: Medial bibasilar consolidation with small left pleural effusion. Lungs elsewhere clear. No pneumothorax. Stable cardiac prominence with areas of postoperative change. Electronically Signed   By: Lowella Grip III M.D.   On: 03/01/2018 09:50    Assessment/Plan: S/P Procedure(s) (LRB): AORTIC VALVE REPLACEMENT -Biological Bentall aortic root replacement, (N/A) THORACIC ASCENDING ANEURYSM REPAIR (AAA) Resection of ascending aorta aneurysm (N/A) TRANSESOPHAGEAL ECHOCARDIOGRAM (TEE) (N/A) CORONARY ARTERY BYPASS GRAFTING (CABG) x three, using left internal mammary artery and right leg greater saphenous vein harvested endoscopically (N/A)  1 conts to progress 2 pulm status requires a bit more work- cont nebs, pulm toilet 3 hemodyn stable in sinus with PVC's, no further afib- cont current beta blocker/amio, BP improving with addition of norvasc and enalapril( less than home dose currently) Cont current dosing.  4 blood sugars ok 5 renal fxn conts to improve 6 cont current coumadin dose with rising INR 7 poss d/c to SNF in am   LOS: 8 days    Rodney Wiley 03/02/2018   Chart reviewed, patient examined, agree with above. He feels much better today. Breathing treatments have loosened up secretions. Lungs sound clear. He will probably be ready to go to SNF tomorrow.

## 2018-03-02 NOTE — Progress Notes (Signed)
CARDIAC REHAB PHASE I   PRE:  Rate/Rhythm: 77 SR  BP:  Supine:   Sitting: 135/70  Standing:    SaO2: 94%RA  MODE:  Ambulation: 620 ft   POST:  Rate/Rhythm: 88 SR  BP:  Supine:   Sitting: 132/68  Standing:    SaO2: 97%RA 1014-1042 Pt walked 620 ft on RA with rolling walker and minimal asst. Adheres to sternal precautions when getting up. Encouraged him to hold pillow when coughing. To recliner after walk. Pt stated breathing better today.   Graylon Good, RN BSN  03/02/2018 10:38 AM

## 2018-03-02 NOTE — Plan of Care (Signed)
  Problem: Education: Goal: Knowledge of General Education information will improve Outcome: Progressing   Problem: Health Behavior/Discharge Planning: Goal: Ability to manage health-related needs will improve Outcome: Progressing   

## 2018-03-02 NOTE — Progress Notes (Signed)
Clinical Social Worker following patient for disposition needs. CSW reached out to admission coordinator(Michelle) for [facility]. Sharyn Lull stated at this time authorization through [facility] has not yet been received. Sharyn Lull stated she will follow up with CSW once authorization has been attained.   Rhea Pink, MSW,  [place]

## 2018-03-03 DIAGNOSIS — J069 Acute upper respiratory infection, unspecified: Secondary | ICD-10-CM | POA: Diagnosis not present

## 2018-03-03 DIAGNOSIS — N183 Chronic kidney disease, stage 3 (moderate): Secondary | ICD-10-CM | POA: Diagnosis not present

## 2018-03-03 DIAGNOSIS — Z9889 Other specified postprocedural states: Secondary | ICD-10-CM | POA: Diagnosis not present

## 2018-03-03 DIAGNOSIS — E871 Hypo-osmolality and hyponatremia: Secondary | ICD-10-CM | POA: Diagnosis not present

## 2018-03-03 DIAGNOSIS — Z951 Presence of aortocoronary bypass graft: Secondary | ICD-10-CM | POA: Diagnosis not present

## 2018-03-03 DIAGNOSIS — J189 Pneumonia, unspecified organism: Secondary | ICD-10-CM | POA: Diagnosis not present

## 2018-03-03 DIAGNOSIS — R5383 Other fatigue: Secondary | ICD-10-CM | POA: Diagnosis not present

## 2018-03-03 DIAGNOSIS — E46 Unspecified protein-calorie malnutrition: Secondary | ICD-10-CM | POA: Diagnosis not present

## 2018-03-03 DIAGNOSIS — R109 Unspecified abdominal pain: Secondary | ICD-10-CM | POA: Diagnosis not present

## 2018-03-03 DIAGNOSIS — R609 Edema, unspecified: Secondary | ICD-10-CM | POA: Diagnosis not present

## 2018-03-03 DIAGNOSIS — R05 Cough: Secondary | ICD-10-CM | POA: Diagnosis not present

## 2018-03-03 DIAGNOSIS — Z48812 Encounter for surgical aftercare following surgery on the circulatory system: Secondary | ICD-10-CM | POA: Diagnosis not present

## 2018-03-03 DIAGNOSIS — I440 Atrioventricular block, first degree: Secondary | ICD-10-CM | POA: Diagnosis not present

## 2018-03-03 DIAGNOSIS — I359 Nonrheumatic aortic valve disorder, unspecified: Secondary | ICD-10-CM | POA: Diagnosis not present

## 2018-03-03 DIAGNOSIS — R262 Difficulty in walking, not elsewhere classified: Secondary | ICD-10-CM | POA: Diagnosis not present

## 2018-03-03 DIAGNOSIS — Z959 Presence of cardiac and vascular implant and graft, unspecified: Secondary | ICD-10-CM | POA: Diagnosis not present

## 2018-03-03 DIAGNOSIS — R079 Chest pain, unspecified: Secondary | ICD-10-CM | POA: Diagnosis not present

## 2018-03-03 DIAGNOSIS — Z5181 Encounter for therapeutic drug level monitoring: Secondary | ICD-10-CM | POA: Diagnosis not present

## 2018-03-03 DIAGNOSIS — I712 Thoracic aortic aneurysm, without rupture: Secondary | ICD-10-CM | POA: Diagnosis not present

## 2018-03-03 DIAGNOSIS — I129 Hypertensive chronic kidney disease with stage 1 through stage 4 chronic kidney disease, or unspecified chronic kidney disease: Secondary | ICD-10-CM | POA: Diagnosis not present

## 2018-03-03 DIAGNOSIS — I340 Nonrheumatic mitral (valve) insufficiency: Secondary | ICD-10-CM | POA: Diagnosis not present

## 2018-03-03 DIAGNOSIS — R062 Wheezing: Secondary | ICD-10-CM | POA: Diagnosis not present

## 2018-03-03 DIAGNOSIS — I351 Nonrheumatic aortic (valve) insufficiency: Secondary | ICD-10-CM | POA: Diagnosis not present

## 2018-03-03 DIAGNOSIS — Z953 Presence of xenogenic heart valve: Secondary | ICD-10-CM | POA: Diagnosis not present

## 2018-03-03 DIAGNOSIS — I080 Rheumatic disorders of both mitral and aortic valves: Secondary | ICD-10-CM | POA: Diagnosis not present

## 2018-03-03 DIAGNOSIS — I2510 Atherosclerotic heart disease of native coronary artery without angina pectoris: Secondary | ICD-10-CM | POA: Diagnosis not present

## 2018-03-03 DIAGNOSIS — I10 Essential (primary) hypertension: Secondary | ICD-10-CM | POA: Diagnosis not present

## 2018-03-03 DIAGNOSIS — D649 Anemia, unspecified: Secondary | ICD-10-CM | POA: Diagnosis not present

## 2018-03-03 DIAGNOSIS — Z8679 Personal history of other diseases of the circulatory system: Secondary | ICD-10-CM | POA: Diagnosis not present

## 2018-03-03 DIAGNOSIS — M6281 Muscle weakness (generalized): Secondary | ICD-10-CM | POA: Diagnosis not present

## 2018-03-03 LAB — PROTIME-INR
INR: 3.01
Prothrombin Time: 31 seconds — ABNORMAL HIGH (ref 11.4–15.2)

## 2018-03-03 MED ORDER — AMIODARONE HCL 200 MG PO TABS: 200.0000 mg | ORAL_TABLET | Freq: Two times a day (BID) | ORAL | 1 refills | Status: DC

## 2018-03-03 MED ORDER — WARFARIN SODIUM 1 MG PO TABS: 1.0000 mg | ORAL_TABLET | Freq: Every day | ORAL | Status: DC

## 2018-03-03 MED ORDER — ENALAPRIL MALEATE 10 MG PO TABS: 10.0000 mg | ORAL_TABLET | Freq: Two times a day (BID) | ORAL | Status: DC

## 2018-03-03 MED ORDER — OXYCODONE HCL 5 MG PO TABS: 5.0000 mg | ORAL_TABLET | Freq: Four times a day (QID) | ORAL | 0 refills | Status: DC | PRN

## 2018-03-03 MED ORDER — LEVALBUTEROL HCL 0.63 MG/3ML IN NEBU: 0.6300 mg | INHALATION_SOLUTION | Freq: Three times a day (TID) | RESPIRATORY_TRACT | Status: DC

## 2018-03-03 MED ORDER — DM-GUAIFENESIN ER 30-600 MG PO TB12
1.0000 | ORAL_TABLET | Freq: Two times a day (BID) | ORAL | Status: DC
  Administered 2018-03-03: 1 via ORAL
  Filled 2018-03-03: qty 1

## 2018-03-03 MED ORDER — DM-GUAIFENESIN ER 30-600 MG PO TB12: 1.0000 | ORAL_TABLET | Freq: Two times a day (BID) | ORAL | Status: DC

## 2018-03-03 MED ORDER — FA-PYRIDOXINE-CYANOCOBALAMIN 2.5-25-2 MG PO TABS: 1.0000 | ORAL_TABLET | Freq: Every day | ORAL | Status: DC

## 2018-03-03 MED ORDER — CARVEDILOL 6.25 MG PO TABS: 6.2500 mg | ORAL_TABLET | Freq: Two times a day (BID) | ORAL | Status: DC

## 2018-03-03 NOTE — Progress Notes (Signed)
Physical Therapy Treatment Patient Details Name: Rodney Wiley MRN: [id number] DOB: 09-Jan-1936 Today's Date: 03/03/2018    History of Present Illness Pt is a 82 y.o. male admitted 02/22/18 s/p AAA repair, AVR, and CABGx3. PMH includes cardiomegaly, CKD III, CAD, HTN, R shoulder sx.   PT Comments    Pt slowly progressing with mobility. Limited by c/o nausea after eating breakfast this morning. Pt requesting ambulation with RW this session; able to do so in hallway with intermittent minA for balance. Amb in room with HHA and minA. Further mobility limited secondary to stomach discomfort. Pt remains appropriate for SNF-level therapies at d/c to maximize functional mobility and independence prior to return home. Will follow acutely.   Follow Up Recommendations  SNF     Equipment Recommendations  3in1 (PT)    Recommendations for Other Services       Precautions / Restrictions Precautions Precautions: Sternal;Fall Precaution Comments: Verbally reviewed precautions (mainly needs cues with sit<>stand) Restrictions Other Position/Activity Restrictions: Sternal    Mobility  Bed Mobility               General bed mobility comments: Received sitting in recliner  Transfers Overall transfer level: Needs assistance Equipment used: Rolling walker (2 wheeled) Transfers: Sit to/from Stand Sit to Stand: Min assist         General transfer comment: MinA for balance; cues to maintain sternal precautions, as pt attempting to push from arm rests  Ambulation/Gait Ambulation/Gait assistance: Min assist Ambulation Distance (Feet): (hallway ambulation) Assistive device: Rolling walker (2 wheeled)   Gait velocity: Decreased Gait velocity interpretation: Below normal speed for age/gender General Gait Details: Pt declining amb without RW today; amb in hallway with RW and intermittent minA for balance (mainly turns). Amb in room with no DME and HHA, requiring minA for balance.  VSS   Stairs            Wheelchair Mobility    Modified Rankin (Stroke Patients Only)       Balance Overall balance assessment: Needs assistance Sitting-balance support: Feet supported;No upper extremity supported Sitting balance-Leahy Scale: Good     Standing balance support: No upper extremity supported Standing balance-Leahy Scale: Fair                              Cognition Arousal/Alertness: Awake/alert Behavior During Therapy: WFL for tasks assessed/performed Overall Cognitive Status: Within Functional Limits for tasks assessed                                        Exercises      General Comments        Pertinent Vitals/Pain Pain Assessment: Faces Pain Score: 5  Pain Location: Sternal incision, stomach/nausea Pain Descriptors / Indicators: Discomfort Pain Intervention(s): Monitored during session;Limited activity within patient's tolerance    Home Living                      Prior Function            PT Goals (current goals can now be found in the care plan section) Acute Rehab PT Goals Patient Stated Goal: to go to rehab so he can go home alone safely PT Goal Formulation: With patient Time For Goal Achievement: 03/19/18 Potential to Achieve Goals: Good Progress towards PT goals: Progressing toward goals  Frequency    Min 2X/week      PT Plan Current plan remains appropriate    Co-evaluation              AM-PAC PT "6 Clicks" Daily Activity  Outcome Measure  Difficulty turning over in bed (including adjusting bedclothes, sheets and blankets)?: Unable Difficulty moving from lying on back to sitting on the side of the bed? : Unable Difficulty sitting down on and standing up from a chair with arms (e.g., wheelchair, bedside commode, etc,.)?: Unable Help needed moving to and from a bed to chair (including a wheelchair)?: A Little Help needed walking in hospital room?: A Little Help needed  climbing 3-5 steps with a railing? : A Little 6 Click Score: 12    End of Session Equipment Utilized During Treatment: Gait belt Activity Tolerance: Patient tolerated treatment well Patient left: in chair;with call bell/phone within reach Nurse Communication: Mobility status PT Visit Diagnosis: Unsteadiness on feet (R26.81);Difficulty in walking, not elsewhere classified (R26.2)     Time: 5686-1683 PT Time Calculation (min) (ACUTE ONLY): 13 min  Charges:  $Gait Training: 8-22 mins                    G Codes:      Mabeline Caras, PT, DPT Acute Rehab Services  Pager: [place] 03/03/2018, 10:07 AM

## 2018-03-03 NOTE — Progress Notes (Signed)
Pt/family given discharge instructions, medication lists, follow up appointments, and when to call the doctor.  Pt/family verbalizes understanding. Pt given signs and symptoms of infection. Conna Terada McClintock, RN    

## 2018-03-03 NOTE — Progress Notes (Signed)
1010-1032 Pt just walked with PT so did not walk. Education completed with pt who voiced understanding. Reviewed sternal precautions and staying in the tube. Gave handout. Discussed IS, ex ed and gave heart healthy diet. Referring to [facility] Phase 2. Pt stated he is getting over 1000 ml on IS. Graylon Good RN BSN 03/03/2018 10:31 AM

## 2018-03-03 NOTE — Progress Notes (Signed)
Clinical Social Worker facilitated patient discharge including contacting patient family and facility to confirm patient discharge plans.  Clinical information faxed to facility and family agreeable with plan.  CSW arranged ambulance transport via [facility] to [facility] .  RN to call [phone] (pt will go in room [place]) for report prior to discharge.  Clinical Social Worker will sign off for now as social work intervention is no longer needed. Please consult [place] again if new need arises.  Rhea Pink, MSW, [place]

## 2018-03-03 NOTE — Progress Notes (Addendum)
[address]       [place] [address]             [phone]      9 Days Post-Op Procedure(s) (LRB): AORTIC VALVE REPLACEMENT -Biological Bentall aortic root replacement, (N/A) THORACIC ASCENDING ANEURYSM REPAIR (AAA) Resection of ascending aorta aneurysm (N/A) TRANSESOPHAGEAL ECHOCARDIOGRAM (TEE) (N/A) CORONARY ARTERY BYPASS GRAFTING (CABG) x three, using left internal mammary artery and right leg greater saphenous vein harvested endoscopically (N/A) Subjective: conts to feel better, breathing also more comfortable  Objective: Vital signs in last 24 hours: Temp:  [98.4 F (36.9 C)-98.6 F (37 C)] 98.4 F (36.9 C) (03/29 0552) Pulse Rate:  [72-90] 90 (03/29 0552) Cardiac Rhythm: Normal sinus rhythm;Heart block (03/28 2000) Resp:  [17-29] 17 (03/29 0552) BP: (105-132)/(60-75) 123/75 (03/29 0552) SpO2:  [92 %-100 %] 92 % (03/29 0552) Weight:  [175 lb 6.4 oz (79.6 kg)] 175 lb 6.4 oz (79.6 kg) (03/29 0441)  Hemodynamic parameters for last 24 hours:    Intake/Output from previous day: 03/28 0701 - 03/29 0700 In: 600 [P.O.:600] Out: 251 [Urine:250; Stool:1] Intake/Output this shift: No intake/output data recorded.  General appearance: alert, cooperative and no distress Heart: regular rate and rhythm and soft syst murmur, occ extrasystole Lungs: scattered exp wheeze Abdomen: benign Extremities: + ankle edema Wound: incis healing well  Lab Results: No results for input(s): WBC, HGB, HCT, PLT in the last 72 hours. BMET:  Recent Labs    03/01/18 0241 03/02/18 0324  NA 139 138  K 3.7 3.9  CL 101 103  CO2 24 25  GLUCOSE 113* 114*  BUN 52* 43*  CREATININE 2.44* 2.33*  CALCIUM 7.8* 7.8*    PT/INR:  Recent Labs    03/03/18 0248  LABPROT 31.0*  INR 3.01   ABG    Component Value Date/Time   PHART 7.370 02/23/2018 1014   HCO3 18.8 (L) 02/23/2018 1014   TCO2 21 (L) 02/23/2018 1620   ACIDBASEDEF 6.0 (H) 02/23/2018 1014   O2SAT 91.0  02/23/2018 1014   CBG (last 3)  Recent Labs    02/28/18 0750 02/28/18 1204  GLUCAP 117* 103*    Meds Scheduled Meds: . amiodarone  200 mg Oral BID PC  . amLODipine  10 mg Oral Daily  . aspirin EC  81 mg Oral Daily  . atorvastatin  20 mg Oral Daily  . bisacodyl  10 mg Oral Daily   Or  . bisacodyl  10 mg Rectal Daily  . carvedilol  6.25 mg Oral BID WC  . Chlorhexidine Gluconate Cloth  6 each Topical Daily  . docusate sodium  200 mg Oral Daily  . doxazosin  2 mg Oral QHS  . enalapril  10 mg Oral BID  . ferrous sulfate  325 mg Oral Q breakfast  . folic acid-pyridoxine-cyancobalamin  1 tablet Oral Daily  . furosemide  40 mg Oral Daily  . gabapentin  100 mg Oral BID  . levalbuterol  0.63 mg Nebulization TID  . mouth rinse  15 mL Mouth Rinse BID  . pantoprazole  40 mg Oral Daily  . potassium chloride  20 mEq Oral Daily  . sodium chloride flush  3 mL Intravenous Q12H  . warfarin  4 mg Oral q1800  . Warfarin - Physician Dosing Inpatient   Does not apply q1800   Continuous Infusions: PRN Meds:.metoprolol tartrate, morphine injection, ondansetron (ZOFRAN) IV, oxyCODONE, traMADol  Xrays No results found.  Assessment/Plan: S/P Procedure(s) (LRB):  AORTIC VALVE REPLACEMENT -Biological Bentall aortic root replacement, (N/A) THORACIC ASCENDING ANEURYSM REPAIR (AAA) Resection of ascending aorta aneurysm (N/A) TRANSESOPHAGEAL ECHOCARDIOGRAM (TEE) (N/A) CORONARY ARTERY BYPASS GRAFTING (CABG) x three, using left internal mammary artery and right leg greater saphenous vein harvested endoscopically (N/A)  1 doing well 2 cont pulm toilet/nebs/rehab 3 hemodyn stable, rhythm stable with freq PVC's 4 reduce coumadin dose to 1 mg with big bump in INR 5 SW assisting with SNF approval 6 BS ok, no other new labs   LOS: 9 days    Rodney Wiley 03/03/2018   Chart reviewed, patient examined, agree with above. He had some nausea this am after eating but better now. He continues to progress  and I think he can go to SNF when bed available.

## 2018-03-03 NOTE — Progress Notes (Signed)
Made three attempts to call facility at [phone] and was unable to get an answer.  Will await call from facility with call for report. Payton Emerald, RN

## 2018-03-03 NOTE — Progress Notes (Signed)
Removed CT sutures and applied benzoin and 1/2 " steri strips.  Pt tolerated procedure well. Pt given signs and symptoms of infection. Applied betadine to midsternal incision and CT sites. Payton Emerald, RN

## 2018-03-03 NOTE — Care Management Note (Signed)
Case Management Note Marvetta Gibbons RN, BSN Unit 4E-Case Manager [phone]  Patient Details  Name: Rodney Wiley MRN: [id number] Date of Birth: [birth year]  Subjective/Objective:  Pt admitted s/p Bentall and CABGx3                Action/Plan: PTA pt lived at home alone, recommendation for SNF- CSW following - auth pending for [facility]  Expected Discharge Date:  03/03/18               Expected Discharge Plan:  Skilled Nursing Facility  In-House Referral:  Clinical Social Work  Discharge planning Services  CM Consult  Post Acute Care Choice:  NA Choice offered to:  NA  DME Arranged:    DME Agency:     HH Arranged:    [place] Agency:     Status of Service:  Completed, signed off  If discussed at [employer] of Stay Meetings, dates discussed:    Discharge Disposition: skilled facility   Additional Comments:  03/03/18- 1300- Marvetta Gibbons RN, CM- per CSW pt has received auth for SNF-[facility]- stable for d/c today- CSW to follow for transition to SNF today.   Dawayne Patricia, RN 03/03/2018, 4:19 PM

## 2018-03-06 ENCOUNTER — Encounter
Admission: RE | Admit: 2018-03-06 | Discharge: 2018-03-06 | Disposition: A | Discharge status: Home / Self Care | Payer: Medicare HMO | Source: Ambulatory Visit | Attending: Internal Medicine | Admitting: Internal Medicine

## 2018-03-07 ENCOUNTER — Other Ambulatory Visit
Admission: RE | Admit: 2018-03-07 | Discharge: 2018-03-07 | Disposition: A | Discharge status: Home / Self Care | Payer: Medicare HMO | Source: Ambulatory Visit | Attending: Gerontology | Admitting: Gerontology

## 2018-03-07 DIAGNOSIS — I712 Thoracic aortic aneurysm, without rupture: Secondary | ICD-10-CM | POA: Diagnosis not present

## 2018-03-07 DIAGNOSIS — I359 Nonrheumatic aortic valve disorder, unspecified: Secondary | ICD-10-CM | POA: Diagnosis not present

## 2018-03-07 DIAGNOSIS — Z48812 Encounter for surgical aftercare following surgery on the circulatory system: Secondary | ICD-10-CM | POA: Insufficient documentation

## 2018-03-07 DIAGNOSIS — Z953 Presence of xenogenic heart valve: Secondary | ICD-10-CM | POA: Insufficient documentation

## 2018-03-07 DIAGNOSIS — I2510 Atherosclerotic heart disease of native coronary artery without angina pectoris: Secondary | ICD-10-CM | POA: Insufficient documentation

## 2018-03-07 DIAGNOSIS — J069 Acute upper respiratory infection, unspecified: Secondary | ICD-10-CM | POA: Diagnosis not present

## 2018-03-07 DIAGNOSIS — I351 Nonrheumatic aortic (valve) insufficiency: Secondary | ICD-10-CM | POA: Diagnosis not present

## 2018-03-07 DIAGNOSIS — R062 Wheezing: Secondary | ICD-10-CM | POA: Insufficient documentation

## 2018-03-07 DIAGNOSIS — R05 Cough: Secondary | ICD-10-CM | POA: Diagnosis not present

## 2018-03-07 DIAGNOSIS — I25118 Atherosclerotic heart disease of native coronary artery with other forms of angina pectoris: Secondary | ICD-10-CM | POA: Insufficient documentation

## 2018-03-07 DIAGNOSIS — R5383 Other fatigue: Secondary | ICD-10-CM | POA: Diagnosis not present

## 2018-03-07 LAB — CBC WITH DIFFERENTIAL/PLATELET
Basophils Absolute: 0 10*3/uL (ref 0–0.1)
Basophils Relative: 0 %
Eosinophils Absolute: 0.2 10*3/uL (ref 0–0.7)
Eosinophils Relative: 2 %
HCT: 26.2 % — ABNORMAL LOW (ref 40.0–52.0)
Hemoglobin: 9 g/dL — ABNORMAL LOW (ref 13.0–18.0)
Lymphocytes Relative: 4 %
Lymphs Abs: 0.5 10*3/uL — ABNORMAL LOW (ref 1.0–3.6)
MCH: 31.6 pg (ref 26.0–34.0)
MCHC: 34.3 g/dL (ref 32.0–36.0)
MCV: 92.2 fL (ref 80.0–100.0)
Monocytes Absolute: 0.7 10*3/uL (ref 0.2–1.0)
Monocytes Relative: 6 %
Neutro Abs: 10.7 10*3/uL — ABNORMAL HIGH (ref 1.4–6.5)
Neutrophils Relative %: 88 %
Platelets: 281 10*3/uL (ref 150–440)
RBC: 2.84 MIL/uL — ABNORMAL LOW (ref 4.40–5.90)
RDW: 13.9 % (ref 11.5–14.5)
WBC: 12.2 10*3/uL — ABNORMAL HIGH (ref 3.8–10.6)

## 2018-03-07 LAB — COMPREHENSIVE METABOLIC PANEL
ALT: 34 U/L (ref 17–63)
AST: 31 U/L (ref 15–41)
Albumin: 2.8 g/dL — ABNORMAL LOW (ref 3.5–5.0)
Alkaline Phosphatase: 50 U/L (ref 38–126)
Anion gap: 9 (ref 5–15)
BUN: 36 mg/dL — ABNORMAL HIGH (ref 6–20)
CO2: 23 mmol/L (ref 22–32)
Calcium: 7.8 mg/dL — ABNORMAL LOW (ref 8.9–10.3)
Chloride: 98 mmol/L — ABNORMAL LOW (ref 101–111)
Creatinine, Ser: 1.83 mg/dL — ABNORMAL HIGH (ref 0.61–1.24)
GFR calc Af Amer: 39 mL/min — ABNORMAL LOW (ref >60)
GFR calc non Af Amer: 33 mL/min — ABNORMAL LOW (ref >60)
Glucose, Bld: 127 mg/dL — ABNORMAL HIGH (ref 65–99)
Potassium: 4.2 mmol/L (ref 3.5–5.1)
Sodium: 130 mmol/L — ABNORMAL LOW (ref 135–145)
Total Bilirubin: 1.1 mg/dL (ref 0.3–1.2)
Total Protein: 6.4 g/dL — ABNORMAL LOW (ref 6.5–8.1)

## 2018-03-07 LAB — PROTIME-INR
INR: 3.38
Prothrombin Time: 33.9 seconds — ABNORMAL HIGH (ref 11.4–15.2)

## 2018-03-09 ENCOUNTER — Other Ambulatory Visit: Payer: Self-pay | Admitting: *Deleted

## 2018-03-09 ENCOUNTER — Encounter: Payer: Medicare HMO | Admitting: Oncology

## 2018-03-09 ENCOUNTER — Non-Acute Institutional Stay (SKILLED_NURSING_FACILITY): Payer: Medicare HMO | Admitting: Gerontology

## 2018-03-09 ENCOUNTER — Encounter: Payer: Self-pay | Admitting: Gerontology

## 2018-03-09 ENCOUNTER — Telehealth: Payer: Self-pay | Admitting: Oncology

## 2018-03-09 ENCOUNTER — Other Ambulatory Visit
Admission: RE | Admit: 2018-03-09 | Discharge: 2018-03-09 | Disposition: A | Discharge status: Home / Self Care | Payer: Medicare HMO | Source: Ambulatory Visit | Attending: Gerontology | Admitting: Gerontology

## 2018-03-09 DIAGNOSIS — Z5181 Encounter for therapeutic drug level monitoring: Secondary | ICD-10-CM | POA: Diagnosis not present

## 2018-03-09 DIAGNOSIS — E871 Hypo-osmolality and hyponatremia: Secondary | ICD-10-CM

## 2018-03-09 DIAGNOSIS — J189 Pneumonia, unspecified organism: Secondary | ICD-10-CM

## 2018-03-09 DIAGNOSIS — Z953 Presence of xenogenic heart valve: Secondary | ICD-10-CM | POA: Insufficient documentation

## 2018-03-09 DIAGNOSIS — E46 Unspecified protein-calorie malnutrition: Secondary | ICD-10-CM | POA: Diagnosis not present

## 2018-03-09 DIAGNOSIS — R609 Edema, unspecified: Secondary | ICD-10-CM | POA: Diagnosis not present

## 2018-03-09 LAB — COMPREHENSIVE METABOLIC PANEL
ALT: 22 U/L (ref 17–63)
AST: 25 U/L (ref 15–41)
Albumin: 2.7 g/dL — ABNORMAL LOW (ref 3.5–5.0)
Alkaline Phosphatase: 57 U/L (ref 38–126)
Anion gap: 8 (ref 5–15)
BUN: 40 mg/dL — ABNORMAL HIGH (ref 6–20)
CO2: 20 mmol/L — ABNORMAL LOW (ref 22–32)
Calcium: 7.8 mg/dL — ABNORMAL LOW (ref 8.9–10.3)
Chloride: 103 mmol/L (ref 101–111)
Creatinine, Ser: 2.08 mg/dL — ABNORMAL HIGH (ref 0.61–1.24)
GFR calc Af Amer: 33 mL/min — ABNORMAL LOW (ref >60)
GFR calc non Af Amer: 29 mL/min — ABNORMAL LOW (ref >60)
Glucose, Bld: 123 mg/dL — ABNORMAL HIGH (ref 65–99)
Potassium: 4 mmol/L (ref 3.5–5.1)
Sodium: 131 mmol/L — ABNORMAL LOW (ref 135–145)
Total Bilirubin: 0.9 mg/dL (ref 0.3–1.2)
Total Protein: 6.4 g/dL — ABNORMAL LOW (ref 6.5–8.1)

## 2018-03-09 LAB — CBC WITH DIFFERENTIAL/PLATELET
Basophils Absolute: 0 10*3/uL (ref 0–0.1)
Basophils Relative: 0 %
Eosinophils Absolute: 0 10*3/uL (ref 0–0.7)
Eosinophils Relative: 0 %
HCT: 26.3 % — ABNORMAL LOW (ref 40.0–52.0)
Hemoglobin: 8.6 g/dL — ABNORMAL LOW (ref 13.0–18.0)
Lymphocytes Relative: 4 %
Lymphs Abs: 0.6 10*3/uL — ABNORMAL LOW (ref 1.0–3.6)
MCH: 30.7 pg (ref 26.0–34.0)
MCHC: 32.7 g/dL (ref 32.0–36.0)
MCV: 93.6 fL (ref 80.0–100.0)
Monocytes Absolute: 1.1 10*3/uL — ABNORMAL HIGH (ref 0.2–1.0)
Monocytes Relative: 7 %
Neutro Abs: 13.9 10*3/uL — ABNORMAL HIGH (ref 1.4–6.5)
Neutrophils Relative %: 89 %
Platelets: 313 10*3/uL (ref 150–440)
RBC: 2.8 MIL/uL — ABNORMAL LOW (ref 4.40–5.90)
RDW: 14 % (ref 11.5–14.5)
WBC: 15.6 10*3/uL — ABNORMAL HIGH (ref 3.8–10.6)

## 2018-03-09 LAB — PROTIME-INR
INR: 2.12
Prothrombin Time: 23.6 seconds — ABNORMAL HIGH (ref 11.4–15.2)

## 2018-03-09 MED ORDER — ATORVASTATIN CALCIUM 20 MG PO TABS: 20.0000 mg | ORAL_TABLET | Freq: Every day | ORAL | 2 refills | Status: DC

## 2018-03-09 NOTE — Telephone Encounter (Signed)
Rschd per MD on PAL. New appt ltr mailed. L/M on patient's son v/m.  Unable to reach patient.

## 2018-03-09 NOTE — Anesthesia Postprocedure Evaluation (Signed)
Anesthesia Post Note  Patient: Rodney Wiley  Procedure(s) Performed: AORTIC VALVE REPLACEMENT -Herbalist aortic root replacement, (N/A Chest) THORACIC ASCENDING ANEURYSM REPAIR (AAA) Resection of ascending aorta aneurysm (N/A Chest) TRANSESOPHAGEAL ECHOCARDIOGRAM (TEE) (N/A ) CORONARY ARTERY BYPASS GRAFTING (CABG) x three, using left internal mammary artery and right leg greater saphenous vein harvested endoscopically (N/A Chest)     Patient location during evaluation: SICU Anesthesia Type: General Level of consciousness: sedated and patient remains intubated per anesthesia plan Pain management: pain level controlled Vital Signs Assessment: post-procedure vital signs reviewed and stable Respiratory status: patient remains intubated per anesthesia plan and patient on ventilator - see flowsheet for VS Cardiovascular status: stable and blood pressure returned to baseline Postop Assessment: no apparent nausea or vomiting Anesthetic complications: no    Last Vitals:  Vitals:   03/03/18 0908 03/03/18 1441  BP:    Pulse: (!) 108 (!) 108  Resp: 18 18  Temp:    SpO2: 97% 97%    Last Pain:  Vitals:   03/03/18 0900  TempSrc:   PainSc: 0-No pain                 Rodney Wiley

## 2018-03-09 NOTE — Progress Notes (Signed)
Location:   [facility] Room Number: 425Z Place of Service:  SNF [id number]) Provider:  Toni Arthurs, NP-C  Remi Haggard, FNP  Patient Care Team: Remi Haggard, FNP as PCP - General (Family Medicine)  Extended Emergency Contact Information Primary Emergency Contact: Angelillo,Todd Address: [address]          [place], [place] [address] [place] Phone: [phone] Relation: Son  Code Status:  FULL Goals of care: Advanced Directive information Advanced Directives 03/09/2018  Does Patient Have a Medical Advance Directive? No  Type of Advance Directive -  Does patient want to make changes to medical advance directive? -  Copy of [place] in Chart? -  Would patient like information on creating a medical advance directive? No - Patient declined     Chief Complaint  Patient presents with  . Acute Visit    Cough    HPI:  Pt is a 82 y.o. male seen today for an acute visit for cough. Pt reports cough kept him awake last night. Reports cough is dry, non-productive. He is having difficulty taking a deep breath d/t the deep breath elicits a cough. Denies chest pain. Denies n/v/d/f/c/cp/ha/abd pain/dizziness/congestion.He does have 3+ BLE pitting edema. PT is present to work with pt and put the TEDs on him. He reports his pain is well controlled on current regimen. Reports appetite is fair. Voiding well and having multiple, loose BMs. Will reduce stool softeners. Lung sounds diminished. No crackles heard. Faint rhonchi in the bases. Wheezing in LUL/LLL. Heart irreg. Otherwise, VSS. No other complaints.     Past Medical History:  Diagnosis Date  . Aortic valve disease   . Arthropathy   . Ascending aortic aneurysm ([place])    a. 12/2016 MRA: 4.3cm @ sinus of valsalva, 4.9cm above sinotubular jxn.  [name] Kitchen BPH (benign prostatic hyperplasia)   . Cardiac murmur   . Cardiomegaly   . CKD (chronic kidney disease), stage III ([place])    one of his kidneys  is on the 'weak" side  . Coronary artery disease involving native heart without angina pectoris   . Diverticulitis   . Essential hypertension   . GERD (gastroesophageal reflux disease)   . Hemorrhoids   . History of chicken pox   . History of Helicobacter pylori infection 06/2007  . Hyperlipidemia    unspecified  . Hypertension   . Left renal artery stenosis (H[facility])    a. 01/2017 RA u/s: moderate L RAS.  [name] Kitchen Lower extremity edema   . Nonrheumatic aortic (valve) insufficiency    a. 12/2016 Echo: mild to mod AI.  [name] Kitchen Nonrheumatic mitral (valve) insufficiency    a. 12/2016 Echo: mild to mod MR.  [name] Kitchen Nonrheumatic pulmonary valve insufficiency   . S/P ascending aortic aneurysm repair 02/22/2018  . 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 AORTIC VALVE REPAIR N/A 02/22/2018   Procedure: AORTIC VALVE REPLACEMENT -Biological Bentall aortic root replacement,;  Surgeon: Rexene Alberts, MD;  Location: [facility];  Service: Open Heart Surgery;  Laterality: N/A;  . CARDIAC CATHETERIZATION  01/16/2018  . COLONOSCOPY  2011   by Dr. Candace Cruise with findings of diverticulosis  . CORONARY ARTERY BYPASS GRAFT N/A 02/22/2018   Procedure: CORONARY ARTERY BYPASS GRAFTING (CABG) x three, using left internal mammary artery and right leg greater saphenous vein harvested endoscopically;  Surgeon: Rexene Alberts, MD;  Location: [facility];  Service: Open Heart Surgery;  Laterality: N/A;  . CORONARY/GRAFT ANGIOGRAPHY N/A 01/16/2018   Procedure: CORONARY/GRAFT ANGIOGRAPHY;  Surgeon: Nelva Bush, MD;  Location: [facility];   Service: Cardiovascular;  Laterality: N/A;  . ELBOW SURGERY Left   . hemorhoidectomy    . RIGHT HEART CATH N/A 01/16/2018   Procedure: RIGHT HEART CATH;  Surgeon: Nelva Bush, MD;  Location: [facility];  Service: Cardiovascular;  Laterality: N/A;  . SHOULDER ARTHROSCOPY WITH ROTATOR CUFF REPAIR Right   . SHOULDER SURGERY Right   . TEE WITHOUT CARDIOVERSION N/A 09/27/2017   Procedure: TRANSESOPHAGEAL ECHOCARDIOGRAM (TEE);  Surgeon: Dorothy Spark, MD;  Location[facility] ENDOSCOPY;  Service: Cardiovascular;  Laterality: N/A;  . TEE WITHOUT CARDIOVERSION N/A 02/22/2018   Procedure: TRANSESOPHAGEAL ECHOCARDIOGRAM (TEE);  Surgeon: Rexene Alberts, MD;  Location: [facility];  Service: Open Heart Surgery;  Laterality: N/A;  . THORACIC AORTIC ANEURYSM REPAIR N/A 02/22/2018   Procedure: THORACIC ASCENDING ANEURYSM REPAIR (AAA) Resection of ascending aorta aneurysm;  Surgeon: Rexene Alberts, MD;  Location: [facility] OR;  Service: Open Heart Surgery;  Laterality: N/A;    No Known Allergies  Allergies as of 03/09/2018   No Known Allergies     Medication List        Accurate as of 03/09/18 11:14 AM. Always use your most recent med list.          amiodarone 200 MG tablet Commonly known as:  PACERONE Take 1 tablet (200 mg total) by mouth 2 (two) times daily after a meal.   atorvastatin 20 MG tablet Commonly known as:  LIPITOR Take 1 tablet (20 mg total) by mouth daily.   carvedilol 6.25 MG tablet Commonly known as:  COREG Take 1 tablet (6.25 mg total) by mouth 2 (two) times daily with a meal.   dextromethorphan-guaiFENesin 30-600 MG 12hr tablet Commonly known as:  MUCINEX DM Take 1 tablet by mouth 2 (two) times daily.   doxazosin 2 MG tablet Commonly known as:  CARDURA Take 2 mg by mouth daily.   enalapril 10 MG tablet Commonly known as:  VASOTEC Take 1 tablet (10 mg total) by mouth 2 (two) times daily.   ENSURE ENLIVE PO Take 1 Bottle by mouth 2 (two) times daily between meals.     feeding supplement (PRO-STAT SUGAR FREE 64) Liqd Take 30 mLs by mouth 2 (two) times daily between meals. low protein, low albumin   ferrous sulfate 325 (65 FE) MG EC tablet Take 325 mg by mouth 2 (two) times daily.   Fluticasone-Salmeterol 250-50 MCG/DOSE Aepb Commonly known as:  ADVAIR Inhale 1 puff into the lungs 2 (two) times daily.   folic acid-pyridoxine-cyancobalamin 2.5-25-2 MG Tabs tablet Commonly known as:  FOLTX Take 1 tablet by mouth daily.   gabapentin 100 MG capsule Commonly known as:  NEURONTIN Take 100 mg by mouth 2 (two) times daily.   oxyCODONE 5 MG immediate release tablet Commonly known as:  Oxy IR/ROXICODONE Take 1-2 tablets (5-10 mg total) by mouth every 6 (six) hours as needed for severe pain.   potassium chloride 10 MEQ tablet Commonly known as:  K-DUR,KLOR-CON Take 10 mEq by  mouth daily.   predniSONE 50 MG tablet Commonly known as:  DELTASONE Take 50 mg by mouth daily with breakfast.   sennosides-docusate sodium 8.6-50 MG tablet Commonly known as:  SENOKOT-S Take 2 tablets by mouth 2 (two) times daily.   torsemide 20 MG tablet Commonly known as:  DEMADEX Take 20 mg by mouth daily.   vitamin C 500 MG tablet Commonly known as:  ASCORBIC ACID Take 500 mg by mouth 2 (two) times daily.   Vitamin D3 2000 units capsule Take 2,000 Units by mouth daily.   warfarin 1 MG tablet Commonly known as:  COUMADIN Take 1 tablet (1 mg total) by mouth daily at 6 PM.       Review of Systems  Constitutional: Positive for appetite change. Negative for activity change, chills, diaphoresis and fever.  HENT: Negative for congestion, facial swelling, mouth sores, nosebleeds, postnasal drip, rhinorrhea, sinus pressure, sinus pain, sneezing, sore throat, trouble swallowing and voice change.   Respiratory: Positive for cough, shortness of breath and wheezing. Negative for apnea, choking and chest tightness.   Cardiovascular: Positive for leg swelling. Negative for  chest pain and palpitations.  Gastrointestinal: Negative for abdominal distention, abdominal pain, constipation, diarrhea and nausea.  Genitourinary: Negative for difficulty urinating, dysuria, frequency and urgency.  Musculoskeletal: Positive for arthralgias (typical arthritis). Negative for back pain, gait problem and myalgias.  Skin: Positive for wound. Negative for color change, pallor and rash.  Neurological: Positive for weakness. Negative for dizziness, tremors, syncope, speech difficulty, numbness and headaches.  Psychiatric/Behavioral: Negative for agitation and behavioral problems.  All other systems reviewed and are negative.   Immunization History  Administered Date(s) Administered  . Influenza-Unspecified 09/13/2012  . Pneumococcal Polysaccharide-23 08/06/2005, 02/04/2011  . Td 04/06/2003   Pertinent  Health Maintenance Due  Topic Date Due  . PNA vac Low Risk Adult (2 of 2 - PCV13) 08/06/2006  . INFLUENZA VACCINE  07/06/2018   No flowsheet data found. Functional Status Survey:    Vitals:   03/09/18 1045  BP: (!) 154/68  Pulse: 78  Resp: 20  Temp: (!) 97.3 F (36.3 C)  TempSrc: Oral  SpO2: 96%  Weight: 182 lb 1.6 oz (82.6 kg)  Height: '5\' 7"'  (1.702 m)   Body mass index is 28.52 kg/m. Physical Exam  Constitutional: He is oriented to person, place, and time. Vital signs are normal. He appears well-developed and well-nourished. He is active and cooperative. He does not appear ill. No distress.  HENT:  Head: Normocephalic and atraumatic.  Mouth/Throat: Uvula is midline, oropharynx is clear and moist and mucous membranes are normal. Mucous membranes are not pale, not dry and not cyanotic.  Eyes: Pupils are equal, round, and reactive to light. Conjunctivae, EOM and lids are normal.  Neck: Trachea normal, normal range of motion and full passive range of motion without pain. Neck supple. No JVD present. No tracheal deviation, no edema and no erythema present. No  thyromegaly present.  Cardiovascular: Normal rate, normal heart sounds, intact distal pulses and normal pulses. An irregular rhythm present. Exam reveals no gallop, no distant heart sounds and no friction rub.  No murmur heard. Pulses:      Dorsalis pedis pulses are 2+ on the right side, and 2+ on the left side.  3+ BLE edema  Pulmonary/Chest: Accessory muscle usage present. No respiratory distress. He has decreased breath sounds in the right upper field and the right middle field. He has wheezes in the left upper field, the left middle field  and the left lower field. He has rhonchi (faint) in the right lower field and the left lower field. He has no rales (faint). He exhibits no tenderness.  Abdominal: Soft. Normal appearance and bowel sounds are normal. He exhibits no distension and no ascites. There is no tenderness.  Musculoskeletal: Normal range of motion. He exhibits no edema or tenderness.  Expected osteoarthritis, stiffness; Bilateral Calves soft, supple. Negative Homan's Sign. B- pedal pulses equal; generalized weakness  Neurological: He is alert and oriented to person, place, and time. He has normal strength.  Skin: Skin is warm and dry. Laceration (midsternal) noted. He is not diaphoretic. No cyanosis. No pallor. Nails show no clubbing.  Psychiatric: He has a normal mood and affect. His speech is normal and behavior is normal. Judgment and thought content normal. Cognition and memory are normal.  Nursing note and vitals reviewed.   Labs reviewed: Recent Labs    02/23/18 0343 02/23/18 1614  02/24/18 0324  02/28/18 0235 03/01/18 0241 03/02/18 0324 03/06/18 1812  NA 140  --    < > 139   < > 141 139 138 130*  K 4.3  --    < > 4.1   < > 4.1 3.7 3.9 4.2  CL 111  --    < > 106   < > 105 101 103 98*  CO2 22  --   --  22   < > '23 24 25 23  ' GLUCOSE 142*  --    < > 142*   < > 91 113* 114* 127*  BUN 25*  --    < > 36*   < > 67* 52* 43* 36*  CREATININE 1.45* 1.92*   < > 2.12*   < >  2.89* 2.44* 2.33* 1.83*  CALCIUM 7.7*  --   --  7.9*   < > 7.7* 7.8* 7.8* 7.8*  MG 2.8* 2.7*  --  2.8*  --   --   --   --   --   PHOS  --   --   --   --   --  3.8 3.4 3.3  --    < > = values in this interval not displayed.   Recent Labs    02/20/18 1143 02/26/18 0435  03/01/18 0241 03/02/18 0324 03/06/18 1812  AST 25 30  --   --   --  31  ALT 29 7*  --   --   --  34  ALKPHOS 54 38  --   --   --  50  BILITOT 1.0 0.9  --   --   --  1.1  PROT 7.4 5.4*  --   --   --  6.4*  ALBUMIN 3.9 2.7*   < > 2.5* 2.5* 2.8*   < > = values in this interval not displayed.   Recent Labs    09/24/17 0825 01/12/18 1413  02/27/18 0244 02/28/18 0242 03/06/18 1812  WBC 4.6 5.1   < > 7.1 7.0 12.2*  NEUTROABS 3.0 3.3  --   --   --  10.7*  HGB 12.0* 11.6*   < > 8.9* 8.6* 9.0*  HCT 34.9* 34.3*   < > 26.2* 25.8* 26.2*  MCV 94.6 94.5   < > 91.6 92.8 92.2  PLT 140* 138*   < > 131* 124* 281   < > = values in this interval not displayed.   No results found for: TSH Lab Results  Component Value  Date   HGBA1C 5.2 02/20/2018   Lab Results  Component Value Date   CHOL 106 03/11/2017   HDL 28 (L) 03/11/2017   LDLCALC 39 03/11/2017   TRIG 194 (H) 03/11/2017   CHOLHDL 3.8 03/11/2017    Significant Diagnostic Results in last 30 days:  Dg Chest 2 View  Result Date: 03/01/2018 CLINICAL DATA:  Recent aortic valve replacement.  Pleural effusion. EXAM: CHEST - 2 VIEW COMPARISON:  February 25, 2018 FINDINGS: There is a small pleural effusion on the left. There is patchy consolidation in the medial lung bases. Lungs elsewhere clear. No pneumothorax. Heart is borderline enlarged with pulmonary vascularity within normal limits. Patient is status post coronary artery bypass grafting and aortic valve replacement. No adenopathy. No bone lesions. IMPRESSION: Medial bibasilar consolidation with small left pleural effusion. Lungs elsewhere clear. No pneumothorax. Stable cardiac prominence with areas of postoperative change.  Electronically Signed   By: Lowella Grip III M.D.   On: 03/01/2018 09:50   Dg Chest 2 View  Result Date: 02/20/2018 CLINICAL DATA:  Aortic valve insufficiency. Thoracic ascending aortic aneurysm EXAM: CHEST - 2 VIEW COMPARISON:  CT chest 01/11/2018 FINDINGS: Heart size within normal limits. Mild atherosclerotic calcification in the aortic arch. Ascending aortic aneurysm best seen on CT. Coronary calcification. Lungs are clear without infiltrate effusion or mass. IMPRESSION: No active cardiopulmonary disease. Electronically Signed   By: Franchot Gallo M.D.   On: 02/20/2018 14:39   Dg Chest Port 1 View  Result Date: 02/25/2018 CLINICAL DATA:  Congestive heart failure. EXAM: PORTABLE CHEST 1 VIEW COMPARISON:  Yesterday. FINDINGS: The right jugular catheter sheath is unchanged. Stable post CABG changes and prosthetic cardiac valve. Subsegmental atelectasis in the left perihilar region and lower lung zone have not changed significantly. Minimal right basilar atelectasis with improvement. The interstitial markings remain mildly prominent. Thoracic spine degenerative changes. IMPRESSION: 1. Improved right basilar atelectasis. 2. Stable atelectasis on the left. 3. Stable cardiomegaly and mild chronic interstitial lung disease. Electronically Signed   By: Claudie Revering M.D.   On: 02/25/2018 07:45   Dg Chest Port 1 View  Result Date: 02/24/2018 CLINICAL DATA:  Status post aortic valve replacement EXAM: PORTABLE CHEST 1 VIEW COMPARISON:  02/23/2018 FINDINGS: Postsurgical changes are again seen. Mediastinal drain remains in place. The endotracheal tube and nasogastric catheter have been removed. Left thoracostomy catheter and pericardial drain are again noted and stable. The Swan-Ganz catheter has been removed as well. Right jugular sheath remains. The overall inspiratory effort is poor with bibasilar atelectatic changes. These are stable from the prior exam. No pneumothorax is noted. IMPRESSION: Stable  bibasilar changes. Tubes and lines as described. Electronically Signed   By: Inez Catalina M.D.   On: 02/24/2018 08:04   Dg Chest Port 1 View  Result Date: 02/23/2018 CLINICAL DATA:  Aortic valve replacement.  Aortic aneurysm. EXAM: PORTABLE CHEST 1 VIEW COMPARISON:  02/22/2018 FINDINGS: Endotracheal tube remains in good position. Swan-Ganz catheter tip in the main pulmonary artery unchanged. Two chest tubes on the left unchanged. Mediastinal drain unchanged. Negative for pneumothorax. Mild bibasilar atelectasis and small left effusion unchanged. Negative for edema. IMPRESSION: Stable support apparatus. Negative for pneumothorax. Bibasilar atelectasis left greater than right unchanged. Electronically Signed   By: Franchot Gallo M.D.   On: 02/23/2018 11:07   Dg Chest Port 1 View  Result Date: 02/22/2018 CLINICAL DATA:  Post CABG EXAM: PORTABLE CHEST 1 VIEW COMPARISON:  Portable exam 1728 hours compared to 02/20/2018 FINDINGS: Tip of endotracheal  tube projects 3.1 cm above carina. Nasogastric tube extends into stomach. RIGHT jugular Swan-Ganz catheter with tip projecting over proximal RIGHT pulmonary artery. Mediastinal drains and LEFT thoracostomy tube present. Enlargement of cardiac silhouette post CABG and AVR. Mediastinal contours appear slightly prominent likely related to surgery and technique. Mild LEFT perihilar infiltrate versus atelectasis. No gross pleural effusion or pneumothorax. IMPRESSION: Expected postoperative changes as above. Electronically Signed   By: Lavonia Dana M.D.   On: 02/22/2018 17:46     Assessment/Plan  Pneumonia of left lung due to infectious organism, unspecified part of lung  Peripheral edema  Protein-calorie malnutrition, unspecified severity ([place])  Hyponatremia  Encounter for therapeutic drug monitoring   NSL IVF  Increase Torsemide to 1 1/2 tablets (30 mg) po Q Day  TED Hose- on in the early am,off in the pm  Elevate legs when at rest  Rocephin 2 grams  IV x 1 now  Rocephin 1 gram IV Q Day x 6 more days  Xopenex 1.25 mg IH TID scheduled  Xopenex 1/25 mg IH Q 4 hours prn  Continue Prednisone 50 mg po Q Day x 5 days  Continue Advair 250-50- 1 puff BID  Regular diet- Not sodium restricted  Gatorade on lunch and supper trays  Decrease Enalapril to 5 mg po BID  Decrease Carvedilol to 3.125 mg po BID  Increase Warfarin to 2 mg po Q Day  Recheck CBC, Met C, INR in am  Continue Mucinex DM 30-600 1 tablet po BID  Pro-Stat 30 mL po BID  Ensure Enlive- 1 bottle po BID  Skin care per protocol  Encourage TCDB/IS 10 x/ hr while awake  Encourage pt to splint chest when coughing  Family/ staff Communication:   Total Time:  Documentation:  Face to Face:  Family/Phone:   Labs/tests ordered:  CXR, KUB, CBC, Met C; Repeat cbc, met c, INr in am  Medication list reviewed and assessed for continued appropriateness.  Vikki Ports, NP-C Geriatrics [facility] [phone], [place] [address] Cell Phone (Mon-Fri 8am-5pm):  [phone] On Call:  [phone] & follow prompts after 5pm & weekends Office Phone:  [phone] Office Fax:  [phone]

## 2018-03-10 ENCOUNTER — Other Ambulatory Visit
Admission: RE | Admit: 2018-03-10 | Discharge: 2018-03-10 | Disposition: A | Discharge status: Home / Self Care | Payer: Medicare HMO | Source: Ambulatory Visit | Attending: Internal Medicine | Admitting: Internal Medicine

## 2018-03-10 ENCOUNTER — Inpatient Hospital Stay: Admission: RE | Admit: 2018-03-10 | Payer: Self-pay | Source: Ambulatory Visit | Admitting: Internal Medicine

## 2018-03-10 DIAGNOSIS — Z953 Presence of xenogenic heart valve: Secondary | ICD-10-CM | POA: Insufficient documentation

## 2018-03-10 LAB — COMPREHENSIVE METABOLIC PANEL
ALT: 20 U/L (ref 17–63)
AST: 21 U/L (ref 15–41)
Albumin: 2.7 g/dL — ABNORMAL LOW (ref 3.5–5.0)
Alkaline Phosphatase: 51 U/L (ref 38–126)
Anion gap: 10 (ref 5–15)
BUN: 43 mg/dL — ABNORMAL HIGH (ref 6–20)
CO2: 21 mmol/L — ABNORMAL LOW (ref 22–32)
Calcium: 7.8 mg/dL — ABNORMAL LOW (ref 8.9–10.3)
Chloride: 103 mmol/L (ref 101–111)
Creatinine, Ser: 2.12 mg/dL — ABNORMAL HIGH (ref 0.61–1.24)
GFR calc Af Amer: 32 mL/min — ABNORMAL LOW (ref >60)
GFR calc non Af Amer: 28 mL/min — ABNORMAL LOW (ref >60)
Glucose, Bld: 90 mg/dL (ref 65–99)
Potassium: 4.1 mmol/L (ref 3.5–5.1)
Sodium: 134 mmol/L — ABNORMAL LOW (ref 135–145)
Total Bilirubin: 0.7 mg/dL (ref 0.3–1.2)
Total Protein: 6.1 g/dL — ABNORMAL LOW (ref 6.5–8.1)

## 2018-03-10 LAB — PROTIME-INR
INR: 1.98
Prothrombin Time: 22.3 seconds — ABNORMAL HIGH (ref 11.4–15.2)

## 2018-03-12 ENCOUNTER — Other Ambulatory Visit
Admission: RE | Admit: 2018-03-12 | Discharge: 2018-03-12 | Disposition: A | Discharge status: Home / Self Care | Payer: Medicare HMO | Source: Skilled Nursing Facility | Attending: Gerontology | Admitting: Gerontology

## 2018-03-12 DIAGNOSIS — I129 Hypertensive chronic kidney disease with stage 1 through stage 4 chronic kidney disease, or unspecified chronic kidney disease: Secondary | ICD-10-CM | POA: Insufficient documentation

## 2018-03-12 LAB — CBC WITH DIFFERENTIAL/PLATELET
Basophils Absolute: 0.1 10*3/uL (ref 0–0.1)
Basophils Relative: 1 %
Eosinophils Absolute: 0 10*3/uL (ref 0–0.7)
Eosinophils Relative: 0 %
HCT: 27.7 % — ABNORMAL LOW (ref 40.0–52.0)
Hemoglobin: 9.2 g/dL — ABNORMAL LOW (ref 13.0–18.0)
Lymphocytes Relative: 6 %
Lymphs Abs: 0.5 10*3/uL — ABNORMAL LOW (ref 1.0–3.6)
MCH: 30.9 pg (ref 26.0–34.0)
MCHC: 33.3 g/dL (ref 32.0–36.0)
MCV: 92.8 fL (ref 80.0–100.0)
Monocytes Absolute: 0.6 10*3/uL (ref 0.2–1.0)
Monocytes Relative: 7 %
Neutro Abs: 7.4 10*3/uL — ABNORMAL HIGH (ref 1.4–6.5)
Neutrophils Relative %: 86 %
Platelets: 274 10*3/uL (ref 150–440)
RBC: 2.99 MIL/uL — ABNORMAL LOW (ref 4.40–5.90)
RDW: 14.2 % (ref 11.5–14.5)
WBC: 8.6 10*3/uL (ref 3.8–10.6)

## 2018-03-12 LAB — BASIC METABOLIC PANEL
Anion gap: 9 (ref 5–15)
BUN: 49 mg/dL — ABNORMAL HIGH (ref 6–20)
CO2: 26 mmol/L (ref 22–32)
Calcium: 8.1 mg/dL — ABNORMAL LOW (ref 8.9–10.3)
Chloride: 98 mmol/L — ABNORMAL LOW (ref 101–111)
Creatinine, Ser: 2.32 mg/dL — ABNORMAL HIGH (ref 0.61–1.24)
GFR calc Af Amer: 29 mL/min — ABNORMAL LOW (ref >60)
GFR calc non Af Amer: 25 mL/min — ABNORMAL LOW (ref >60)
Glucose, Bld: 105 mg/dL — ABNORMAL HIGH (ref 65–99)
Potassium: 4 mmol/L (ref 3.5–5.1)
Sodium: 133 mmol/L — ABNORMAL LOW (ref 135–145)

## 2018-03-12 LAB — PROTIME-INR
INR: 1.56
Prothrombin Time: 18.5 seconds — ABNORMAL HIGH (ref 11.4–15.2)

## 2018-03-13 ENCOUNTER — Other Ambulatory Visit
Admission: RE | Admit: 2018-03-13 | Discharge: 2018-03-13 | Disposition: A | Discharge status: Home / Self Care | Payer: Medicare HMO | Source: Ambulatory Visit | Attending: Gerontology | Admitting: Gerontology

## 2018-03-13 DIAGNOSIS — I129 Hypertensive chronic kidney disease with stage 1 through stage 4 chronic kidney disease, or unspecified chronic kidney disease: Secondary | ICD-10-CM | POA: Insufficient documentation

## 2018-03-13 LAB — CBC WITH DIFFERENTIAL/PLATELET
Basophils Absolute: 0 10*3/uL (ref 0–0.1)
Basophils Relative: 0 %
Eosinophils Absolute: 0 10*3/uL (ref 0–0.7)
Eosinophils Relative: 0 %
HCT: 27.8 % — ABNORMAL LOW (ref 40.0–52.0)
Hemoglobin: 9.3 g/dL — ABNORMAL LOW (ref 13.0–18.0)
Lymphocytes Relative: 7 %
Lymphs Abs: 0.6 10*3/uL — ABNORMAL LOW (ref 1.0–3.6)
MCH: 30.9 pg (ref 26.0–34.0)
MCHC: 33.4 g/dL (ref 32.0–36.0)
MCV: 92.7 fL (ref 80.0–100.0)
Monocytes Absolute: 0.5 10*3/uL (ref 0.2–1.0)
Monocytes Relative: 6 %
Neutro Abs: 8.3 10*3/uL — ABNORMAL HIGH (ref 1.4–6.5)
Neutrophils Relative %: 87 %
Platelets: 275 10*3/uL (ref 150–440)
RBC: 3 MIL/uL — ABNORMAL LOW (ref 4.40–5.90)
RDW: 14.1 % (ref 11.5–14.5)
WBC: 9.5 10*3/uL (ref 3.8–10.6)

## 2018-03-13 LAB — BASIC METABOLIC PANEL
Anion gap: 10 (ref 5–15)
BUN: 56 mg/dL — ABNORMAL HIGH (ref 6–20)
CO2: 26 mmol/L (ref 22–32)
Calcium: 8 mg/dL — ABNORMAL LOW (ref 8.9–10.3)
Chloride: 97 mmol/L — ABNORMAL LOW (ref 101–111)
Creatinine, Ser: 2.31 mg/dL — ABNORMAL HIGH (ref 0.61–1.24)
GFR calc Af Amer: 29 mL/min — ABNORMAL LOW (ref >60)
GFR calc non Af Amer: 25 mL/min — ABNORMAL LOW (ref >60)
Glucose, Bld: 112 mg/dL — ABNORMAL HIGH (ref 65–99)
Potassium: 4 mmol/L (ref 3.5–5.1)
Sodium: 133 mmol/L — ABNORMAL LOW (ref 135–145)

## 2018-03-13 LAB — PROTIME-INR
INR: 1.46
Prothrombin Time: 17.6 seconds — ABNORMAL HIGH (ref 11.4–15.2)

## 2018-03-14 ENCOUNTER — Other Ambulatory Visit
Admission: RE | Admit: 2018-03-14 | Discharge: 2018-03-14 | Disposition: A | Discharge status: Home / Self Care | Payer: Medicare HMO | Source: Ambulatory Visit | Attending: Gerontology | Admitting: Gerontology

## 2018-03-14 DIAGNOSIS — I129 Hypertensive chronic kidney disease with stage 1 through stage 4 chronic kidney disease, or unspecified chronic kidney disease: Secondary | ICD-10-CM | POA: Insufficient documentation

## 2018-03-14 LAB — COMPREHENSIVE METABOLIC PANEL
ALT: 22 U/L (ref 17–63)
AST: 18 U/L (ref 15–41)
Albumin: 2.7 g/dL — ABNORMAL LOW (ref 3.5–5.0)
Alkaline Phosphatase: 57 U/L (ref 38–126)
Anion gap: 8 (ref 5–15)
BUN: 68 mg/dL — ABNORMAL HIGH (ref 6–20)
CO2: 30 mmol/L (ref 22–32)
Calcium: 8 mg/dL — ABNORMAL LOW (ref 8.9–10.3)
Chloride: 96 mmol/L — ABNORMAL LOW (ref 101–111)
Creatinine, Ser: 2.57 mg/dL — ABNORMAL HIGH (ref 0.61–1.24)
GFR calc Af Amer: 26 mL/min — ABNORMAL LOW (ref >60)
GFR calc non Af Amer: 22 mL/min — ABNORMAL LOW (ref >60)
Glucose, Bld: 95 mg/dL (ref 65–99)
Potassium: 3.7 mmol/L (ref 3.5–5.1)
Sodium: 134 mmol/L — ABNORMAL LOW (ref 135–145)
Total Bilirubin: 0.8 mg/dL (ref 0.3–1.2)
Total Protein: 6.2 g/dL — ABNORMAL LOW (ref 6.5–8.1)

## 2018-03-14 LAB — PROTIME-INR
INR: 1.56
Prothrombin Time: 18.5 seconds — ABNORMAL HIGH (ref 11.4–15.2)

## 2018-03-15 ENCOUNTER — Other Ambulatory Visit: Payer: Self-pay | Admitting: Internal Medicine

## 2018-03-15 ENCOUNTER — Encounter: Payer: Self-pay | Admitting: Gerontology

## 2018-03-15 ENCOUNTER — Other Ambulatory Visit
Admission: RE | Admit: 2018-03-15 | Discharge: 2018-03-15 | Disposition: A | Discharge status: Home / Self Care | Payer: Medicare HMO | Source: Ambulatory Visit | Attending: Gerontology | Admitting: Gerontology

## 2018-03-15 ENCOUNTER — Non-Acute Institutional Stay (SKILLED_NURSING_FACILITY): Payer: Medicare HMO | Admitting: Gerontology

## 2018-03-15 DIAGNOSIS — I129 Hypertensive chronic kidney disease with stage 1 through stage 4 chronic kidney disease, or unspecified chronic kidney disease: Secondary | ICD-10-CM | POA: Insufficient documentation

## 2018-03-15 DIAGNOSIS — I340 Nonrheumatic mitral (valve) insufficiency: Secondary | ICD-10-CM

## 2018-03-15 DIAGNOSIS — N183 Chronic kidney disease, stage 3 unspecified: Secondary | ICD-10-CM

## 2018-03-15 DIAGNOSIS — Z5181 Encounter for therapeutic drug level monitoring: Secondary | ICD-10-CM | POA: Diagnosis not present

## 2018-03-15 DIAGNOSIS — I712 Thoracic aortic aneurysm, without rupture, unspecified: Secondary | ICD-10-CM

## 2018-03-15 DIAGNOSIS — J189 Pneumonia, unspecified organism: Secondary | ICD-10-CM

## 2018-03-15 DIAGNOSIS — Z9889 Other specified postprocedural states: Secondary | ICD-10-CM

## 2018-03-15 DIAGNOSIS — I2510 Atherosclerotic heart disease of native coronary artery without angina pectoris: Secondary | ICD-10-CM

## 2018-03-15 DIAGNOSIS — Z8679 Personal history of other diseases of the circulatory system: Secondary | ICD-10-CM | POA: Diagnosis not present

## 2018-03-15 DIAGNOSIS — Z953 Presence of xenogenic heart valve: Secondary | ICD-10-CM | POA: Diagnosis not present

## 2018-03-15 DIAGNOSIS — Z951 Presence of aortocoronary bypass graft: Secondary | ICD-10-CM | POA: Diagnosis not present

## 2018-03-15 LAB — CBC WITH DIFFERENTIAL/PLATELET
Basophils Absolute: 0 10*3/uL (ref 0–0.1)
Basophils Relative: 0 %
Eosinophils Absolute: 0.3 10*3/uL (ref 0–0.7)
Eosinophils Relative: 4 %
HCT: 31.2 % — ABNORMAL LOW (ref 40.0–52.0)
Hemoglobin: 10.7 g/dL — ABNORMAL LOW (ref 13.0–18.0)
Lymphocytes Relative: 8 %
Lymphs Abs: 0.7 10*3/uL — ABNORMAL LOW (ref 1.0–3.6)
MCH: 32 pg (ref 26.0–34.0)
MCHC: 34.3 g/dL (ref 32.0–36.0)
MCV: 93.4 fL (ref 80.0–100.0)
Monocytes Absolute: 0.5 10*3/uL (ref 0.2–1.0)
Monocytes Relative: 6 %
Neutro Abs: 6.9 10*3/uL — ABNORMAL HIGH (ref 1.4–6.5)
Neutrophils Relative %: 82 %
Platelets: 199 10*3/uL (ref 150–440)
RBC: 3.34 MIL/uL — ABNORMAL LOW (ref 4.40–5.90)
RDW: 14.4 % (ref 11.5–14.5)
WBC: 8.4 10*3/uL (ref 3.8–10.6)

## 2018-03-15 LAB — COMPREHENSIVE METABOLIC PANEL
ALT: 19 U/L (ref 17–63)
AST: 21 U/L (ref 15–41)
Albumin: 2.9 g/dL — ABNORMAL LOW (ref 3.5–5.0)
Alkaline Phosphatase: 63 U/L (ref 38–126)
Anion gap: 9 (ref 5–15)
BUN: 68 mg/dL — ABNORMAL HIGH (ref 6–20)
CO2: 27 mmol/L (ref 22–32)
Calcium: 8 mg/dL — ABNORMAL LOW (ref 8.9–10.3)
Chloride: 97 mmol/L — ABNORMAL LOW (ref 101–111)
Creatinine, Ser: 2.54 mg/dL — ABNORMAL HIGH (ref 0.61–1.24)
GFR calc Af Amer: 26 mL/min — ABNORMAL LOW (ref >60)
GFR calc non Af Amer: 22 mL/min — ABNORMAL LOW (ref >60)
Glucose, Bld: 138 mg/dL — ABNORMAL HIGH (ref 65–99)
Potassium: 3.8 mmol/L (ref 3.5–5.1)
Sodium: 133 mmol/L — ABNORMAL LOW (ref 135–145)
Total Bilirubin: 0.9 mg/dL (ref 0.3–1.2)
Total Protein: 6.7 g/dL (ref 6.5–8.1)

## 2018-03-15 LAB — PROTIME-INR
INR: 1.4
Prothrombin Time: 17 seconds — ABNORMAL HIGH (ref 11.4–15.2)

## 2018-03-15 NOTE — Assessment & Plan Note (Signed)
Incision well approximated. No redness, no warmth, no drainage. Minimal pain. Minimal pain. Pt is accurately using splinting technique when coughing.

## 2018-03-15 NOTE — Progress Notes (Signed)
Opened in error; Disregard.

## 2018-03-15 NOTE — Assessment & Plan Note (Signed)
Incision well approximated. No redness, no warmth, no drainage. Minimal pain

## 2018-03-15 NOTE — Assessment & Plan Note (Signed)
Incision well approximated. No redness, no warmth, no drainage. Minimal pain. Pt is accurately using splinting technique when coughing. Coumadin is being actively managed with frequent adjustments.

## 2018-03-15 NOTE — Assessment & Plan Note (Signed)
Pt with persistently elevating BUN, Creatinine despite reduction or elimination of nephrotoxic medications and intermittent IV hydration. Pt continues to void well. Peripheral edema reduced.

## 2018-03-15 NOTE — Progress Notes (Signed)
Location:   [facility] Room Number: 681L Place of Service:  SNF [phone]) Provider:  Toni Arthurs, NP-C  Remi Haggard, FNP  Patient Care Team: Remi Haggard, FNP as PCP - General (Family Medicine)  Extended Emergency Contact Information Primary Emergency Contact: Reading,Todd Address: [address]          [place], [place] [address] [place] Phone: [phone] Relation: Son  Code Status:  FULL Goals of care: Advanced Directive information Advanced Directives 03/15/2018  Does Patient Have a Medical Advance Directive? No  Type of Advance Directive -  Does patient want to make changes to medical advance directive? -  Copy of [place] in Chart? -  Would patient like information on creating a medical advance directive? No - Patient declined     Chief Complaint  Patient presents with  . Medical Management of Chronic Issues    Routine Visit    HPI:  Pt is a 82 y.o. male seen today for medical management of chronic diseases.    Stage 3 chronic kidney disease ([place]) Pt with persistently elevating BUN, Creatinine despite reduction or elimination of nephrotoxic medications and intermittent IV hydration. Pt continues to void well. Peripheral edema reduced.  Thoracic aortic aneurysm without rupture (HCC) S/P ascending aortic aneurysm repair Incision well approximated. No redness, no warmth, no drainage. Minimal pain  Non-rheumatic mitral regurgitation S/P biological Bentall aortic root replacement with bioprosthetic valve and synthetic root conduit + CABG x3 Incision well approximated. No redness, no warmth, no drainage. Minimal pain. Pt is accurately using splinting technique when coughing. Coumadin is being actively managed with frequent adjustments.   Coronary artery disease involving native coronary artery of native heart without angina pectoris S/P CABG x 3 Incision well approximated. No redness, no warmth,  no drainage. Minimal pain. Minimal pain. Pt is accurately using splinting technique when coughing.   Pneumonia of left lung due to infectious organism, unspecified part of lung Persistent. Pt reports he is feeling much better. Has been on 7 days of IV Rocephin. No fever. Lungs clear. Denies chest pain and generalize shortness of breath. Reports he is now able to lay flat to sleep at night. Continues with persistent cough and some dyspnea when talking or taking deep breath. Was on short burst of Prednisone last week, has been receiving scheduled Xopenex with some improvement of symptoms. Overall coloring is better. Cough is non-productive.   Encounter for therapeutic drug monitoring INR continues to trend downward despite increasing Coumadin dose. No abnormal bleeding. Will continue to titrate dose upwards with frequent INRs.  Past Medical History:  Diagnosis Date  . Aortic valve disease   . Arthropathy   . Ascending aortic aneurysm ([place])    a. 12/2016 MRA: 4.3cm @ sinus of valsalva, 4.9cm above sinotubular jxn.  [name] Kitchen BPH (benign prostatic hyperplasia)   . Cardiac murmur   . Cardiomegaly   . CKD (chronic kidney disease), stage III ([place])    one of his kidneys  is on the 'weak" side  . Coronary artery disease involving native heart without angina pectoris   . Diverticulitis   . Essential hypertension   . GERD (gastroesophageal reflux disease)   . Hemorrhoids   . History of chicken pox   . History of Helicobacter pylori infection 06/2007  . Hyperlipidemia    unspecified  . Hypertension   . Left renal artery stenosis (HCC)    a. 01/2017 RA u/s: moderate L RAS.  [name] Kitchen  Lower extremity edema   . Nonrheumatic aortic (valve) insufficiency    a. 12/2016 Echo: mild to mod AI.  [name] Kitchen Nonrheumatic mitral (valve) insufficiency    a. 12/2016 Echo: mild to mod MR.  [name] Kitchen Nonrheumatic pulmonary valve insufficiency   . S/P ascending aortic aneurysm repair 02/22/2018  . S/P biological Bentall aortic root replacement  with bioprosthetic valve and synthetic root conduit 02/22/2018   21 mm Edwards Inspiris Resilia stented bovine pericardial tissue valve and 24 mm Gelweave Valsalva synthetic root conduit with reimplantation of left main coronary artery  . 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 AORTIC VALVE REPAIR N/A 02/22/2018   Procedure: AORTIC VALVE REPLACEMENT -Biological Bentall aortic root replacement,;  Surgeon: Rexene Alberts, MD;  Location: [facility];  Service: Open Heart Surgery;  Laterality: N/A;  . CARDIAC CATHETERIZATION     01/16/2018  . COLONOSCOPY  2011   by Dr. Candace Cruise with findings of diverticulosis  . CORONARY ARTERY BYPASS GRAFT N/A 02/22/2018   Procedure: CORONARY ARTERY BYPASS GRAFTING (CABG) x three, using left internal mammary artery and right leg greater saphenous vein harvested endoscopically;  Surgeon: Rexene Alberts, MD;  Location: [facility];  Service: Open Heart Surgery;  Laterality: N/A;  . CORONARY/GRAFT ANGIOGRAPHY N/A 01/16/2018   Procedure: CORONARY/GRAFT ANGIOGRAPHY;  Surgeon: Nelva Bush, MD;  Location: [facility] CV LAB;  Service: Cardiovascular;  Laterality: N/A;  . ELBOW SURGERY Left   . hemorhoidectomy    . RIGHT HEART CATH N/A 01/16/2018   Procedure: RIGHT HEART CATH;  Surgeon: Nelva Bush, MD;  Location: [facility] CV LAB;  Service: Cardiovascular;  Laterality: N/A;  . SHOULDER ARTHROSCOPY WITH ROTATOR CUFF REPAIR Right   . SHOULDER SURGERY Right   . TEE WITHOUT CARDIOVERSION N/A 09/27/2017   Procedure: TRANSESOPHAGEAL ECHOCARDIOGRAM (TEE);  Surgeon: Dorothy Spark, MD;  Location[facility] ENDOSCOPY;  Service: Cardiovascular;  Laterality: N/A;  . TEE WITHOUT CARDIOVERSION N/A 02/22/2018   Procedure: TRANSESOPHAGEAL ECHOCARDIOGRAM (TEE);  Surgeon: Rexene Alberts, MD;  Location: [facility];  Service: Open Heart Surgery;  Laterality: N/A;  . THORACIC AORTIC ANEURYSM REPAIR N/A  02/22/2018   Procedure: THORACIC ASCENDING ANEURYSM REPAIR (AAA) Resection of ascending aorta aneurysm;  Surgeon: Rexene Alberts, MD;  Location: [facility] OR;  Service: Open Heart Surgery;  Laterality: N/A;    No Known Allergies  Allergies as of 03/15/2018   No Known Allergies     Medication List        Accurate as of 03/15/18  2:58 PM. Always use your most recent med list.          amiodarone 200 MG tablet Commonly known as:  PACERONE Take 1 tablet (200 mg total) by mouth 2 (two) times daily after a meal.   atorvastatin 20 MG tablet Commonly known as:  LIPITOR Take 1 tablet (20 mg total) by mouth daily.   carvedilol 3.125 MG tablet Commonly known as:  COREG Take 3.125 mg by mouth 2 (two) times daily with a meal.   dextromethorphan-guaiFENesin 30-600 MG 12hr tablet Commonly known as:  MUCINEX DM Take 1 tablet by mouth 2 (two) times daily.   doxazosin 2 MG tablet Commonly known as:  CARDURA Take 2 mg by mouth daily.   ENSURE ENLIVE PO Take 1 Bottle by mouth 2 (two) times daily between meals.   feeding supplement (PRO-STAT SUGAR FREE  64) Liqd Take 30 mLs by mouth 2 (two) times daily between meals. low protein, low albumin   ferrous sulfate 325 (65 FE) MG EC tablet Take 325 mg by mouth 2 (two) times daily.   Fluticasone-Salmeterol 250-50 MCG/DOSE Aepb Commonly known as:  ADVAIR Inhale 1 puff into the lungs 2 (two) times daily.   fluticasone-salmeterol 115-21 MCG/ACT inhaler Commonly known as:  ADVAIR HFA Inhale 2 puffs into the lungs 2 (two) times daily. Use with spacer/ Aerochamber   folic acid-pyridoxine-cyancobalamin 2.5-25-2 MG Tabs tablet Commonly known as:  FOLTX Take 1 tablet by mouth daily.   levalbuterol 1.25 MG/3ML nebulizer solution Commonly known as:  XOPENEX Take 1.25 mg by nebulization every 4 (four) hours as needed.   levalbuterol 1.25 MG/3ML nebulizer solution Commonly known as:  XOPENEX Take 1.25 mg by nebulization 3 (three) times daily.     oxyCODONE 5 MG immediate release tablet Commonly known as:  Oxy IR/ROXICODONE Take 1-2 tablets (5-10 mg total) by mouth every 6 (six) hours as needed for severe pain.   potassium chloride 10 MEQ tablet Commonly known as:  K-DUR,KLOR-CON Take 10 mEq by mouth daily.   sennosides-docusate sodium 8.6-50 MG tablet Commonly known as:  SENOKOT-S Take 1 tablet by mouth 2 (two) times daily.   sodium chloride 0.9 % Inject 250 mLs into the vein. over 1 hour, then continuous at 75 mL/ hr x 48 hours for CKD   torsemide 20 MG tablet Commonly known as:  DEMADEX Take 20 mg by mouth daily.   vitamin C 500 MG tablet Commonly known as:  ASCORBIC ACID Take 500 mg by mouth 2 (two) times daily.   Vitamin D3 2000 units capsule Take 2,000 Units by mouth daily.   warfarin 5 MG tablet Commonly known as:  COUMADIN Take 5 mg by mouth daily.       Review of Systems  Constitutional: Negative for activity change, appetite change, chills, diaphoresis and fever.  HENT: Negative for congestion, mouth sores, nosebleeds, postnasal drip, sneezing, sore throat, trouble swallowing and voice change.   Respiratory: Positive for cough and shortness of breath. Negative for apnea, choking, chest tightness and wheezing.   Cardiovascular: Negative for chest pain, palpitations and leg swelling.  Gastrointestinal: Negative for abdominal distention, abdominal pain, constipation, diarrhea and nausea.  Genitourinary: Negative for difficulty urinating, dysuria, frequency and urgency.  Musculoskeletal: Positive for arthralgias (typical arthritis). Negative for back pain, gait problem and myalgias.  Skin: Positive for wound. Negative for color change, pallor and rash.  Neurological: Positive for weakness. Negative for dizziness, tremors, syncope, speech difficulty, numbness and headaches.  Psychiatric/Behavioral: Negative for agitation and behavioral problems.  All other systems reviewed and are negative.   Immunization  History  Administered Date(s) Administered  . Influenza-Unspecified 09/13/2012  . Pneumococcal Polysaccharide-23 08/06/2005, 02/04/2011  . Td 04/06/2003   Pertinent  Health Maintenance Due  Topic Date Due  . PNA vac Low Risk Adult (2 of 2 - PCV13) 02/04/2012  . INFLUENZA VACCINE  07/06/2018   No flowsheet data found. Functional Status Survey:    Vitals:   03/15/18 1419  BP: 120/67  Pulse: 62  Resp: 20  Temp: 97.9 F (36.6 C)  TempSrc: Oral  SpO2: 98%  Weight: 169 lb 11.2 oz (77 kg)  Height: '5\' 7"'  (1.702 m)   Body mass index is 26.58 kg/m. Physical Exam  Constitutional: He is oriented to person, place, and time. Vital signs are normal. He appears well-developed and well-nourished. He is active and cooperative.  He does not appear ill. No distress.  HENT:  Head: Normocephalic and atraumatic.  Mouth/Throat: Uvula is midline, oropharynx is clear and moist and mucous membranes are normal. Mucous membranes are not pale, not dry and not cyanotic.  Eyes: Pupils are equal, round, and reactive to light. Conjunctivae, EOM and lids are normal.  Neck: Trachea normal, normal range of motion and full passive range of motion without pain. Neck supple. No JVD present. No tracheal deviation, no edema and no erythema present. No thyromegaly present.  Cardiovascular: Normal rate, regular rhythm, intact distal pulses and normal pulses. Exam reveals no gallop, no distant heart sounds and no friction rub.  Murmur heard. Pulses:      Dorsalis pedis pulses are 2+ on the right side, and 2+ on the left side.  No edema  Pulmonary/Chest: Effort normal. No accessory muscle usage. No respiratory distress. He has decreased breath sounds in the right upper field and the left upper field. He has no wheezes. He has no rhonchi. He has no rales. He exhibits no tenderness.  Abdominal: Soft. Normal appearance and bowel sounds are normal. He exhibits no distension and no ascites. There is no tenderness.    Musculoskeletal: Normal range of motion. He exhibits no edema or tenderness.  Expected osteoarthritis, stiffness; Bilateral Calves soft, supple. Negative Homan's Sign. B- pedal pulses equal; generalized weakness  Neurological: He is alert and oriented to person, place, and time. He has normal strength.  Skin: Skin is warm and dry. Laceration (chest incisions) noted. He is not diaphoretic. No cyanosis. No pallor. Nails show no clubbing.  Psychiatric: He has a normal mood and affect. His speech is normal and behavior is normal. Judgment and thought content normal. Cognition and memory are normal.  Nursing note and vitals reviewed.   Labs reviewed: Recent Labs    02/23/18 0343 02/23/18 1614  02/24/18 0324  02/28/18 0235 03/01/18 0241 03/02/18 0324  03/13/18 0600 03/14/18 0530 03/15/18 1015  NA 140  --    < > 139   < > 141 139 138   < > 133* 134* 133*  K 4.3  --    < > 4.1   < > 4.1 3.7 3.9   < > 4.0 3.7 3.8  CL 111  --    < > 106   < > 105 101 103   < > 97* 96* 97*  CO2 22  --   --  22   < > '23 24 25   ' < > '26 30 27  ' GLUCOSE 142*  --    < > 142*   < > 91 113* 114*   < > 112* 95 138*  BUN 25*  --    < > 36*   < > 67* 52* 43*   < > 56* 68* 68*  CREATININE 1.45* 1.92*   < > 2.12*   < > 2.89* 2.44* 2.33*   < > 2.31* 2.57* 2.54*  CALCIUM 7.7*  --   --  7.9*   < > 7.7* 7.8* 7.8*   < > 8.0* 8.0* 8.0*  MG 2.8* 2.7*  --  2.8*  --   --   --   --   --   --   --   --   PHOS  --   --   --   --   --  3.8 3.4 3.3  --   --   --   --    < > =  values in this interval not displayed.   Recent Labs    03/10/18 0500 03/14/18 0530 03/15/18 1015  AST '21 18 21  ' ALT '20 22 19  ' ALKPHOS 51 57 63  BILITOT 0.7 0.8 0.9  PROT 6.1* 6.2* 6.7  ALBUMIN 2.7* 2.7* 2.9*   Recent Labs    03/12/18 0530 03/13/18 0600 03/15/18 1015  WBC 8.6 9.5 8.4  NEUTROABS 7.4* 8.3* 6.9*  HGB 9.2* 9.3* 10.7*  HCT 27.7* 27.8* 31.2*  MCV 92.8 92.7 93.4  PLT 274 275 199   No results found for: TSH Lab Results  Component  Value Date   HGBA1C 5.2 02/20/2018   Lab Results  Component Value Date   CHOL 106 03/11/2017   HDL 28 (L) 03/11/2017   LDLCALC 39 03/11/2017   TRIG 194 (H) 03/11/2017   CHOLHDL 3.8 03/11/2017    Significant Diagnostic Results in last 30 days:  Dg Chest 2 View  Result Date: 03/01/2018 CLINICAL DATA:  Recent aortic valve replacement.  Pleural effusion. EXAM: CHEST - 2 VIEW COMPARISON:  February 25, 2018 FINDINGS: There is a small pleural effusion on the left. There is patchy consolidation in the medial lung bases. Lungs elsewhere clear. No pneumothorax. Heart is borderline enlarged with pulmonary vascularity within normal limits. Patient is status post coronary artery bypass grafting and aortic valve replacement. No adenopathy. No bone lesions. IMPRESSION: Medial bibasilar consolidation with small left pleural effusion. Lungs elsewhere clear. No pneumothorax. Stable cardiac prominence with areas of postoperative change. Electronically Signed   By: Lowella Grip III M.D.   On: 03/01/2018 09:50   Dg Chest 2 View  Result Date: 02/20/2018 CLINICAL DATA:  Aortic valve insufficiency. Thoracic ascending aortic aneurysm EXAM: CHEST - 2 VIEW COMPARISON:  CT chest 01/11/2018 FINDINGS: Heart size within normal limits. Mild atherosclerotic calcification in the aortic arch. Ascending aortic aneurysm best seen on CT. Coronary calcification. Lungs are clear without infiltrate effusion or mass. IMPRESSION: No active cardiopulmonary disease. Electronically Signed   By: Franchot Gallo M.D.   On: 02/20/2018 14:39   Dg Chest Port 1 View  Result Date: 02/25/2018 CLINICAL DATA:  Congestive heart failure. EXAM: PORTABLE CHEST 1 VIEW COMPARISON:  Yesterday. FINDINGS: The right jugular catheter sheath is unchanged. Stable post CABG changes and prosthetic cardiac valve. Subsegmental atelectasis in the left perihilar region and lower lung zone have not changed significantly. Minimal right basilar atelectasis with  improvement. The interstitial markings remain mildly prominent. Thoracic spine degenerative changes. IMPRESSION: 1. Improved right basilar atelectasis. 2. Stable atelectasis on the left. 3. Stable cardiomegaly and mild chronic interstitial lung disease. Electronically Signed   By: Claudie Revering M.D.   On: 02/25/2018 07:45   Dg Chest Port 1 View  Result Date: 02/24/2018 CLINICAL DATA:  Status post aortic valve replacement EXAM: PORTABLE CHEST 1 VIEW COMPARISON:  02/23/2018 FINDINGS: Postsurgical changes are again seen. Mediastinal drain remains in place. The endotracheal tube and nasogastric catheter have been removed. Left thoracostomy catheter and pericardial drain are again noted and stable. The Swan-Ganz catheter has been removed as well. Right jugular sheath remains. The overall inspiratory effort is poor with bibasilar atelectatic changes. These are stable from the prior exam. No pneumothorax is noted. IMPRESSION: Stable bibasilar changes. Tubes and lines as described. Electronically Signed   By: Inez Catalina M.D.   On: 02/24/2018 08:04   Dg Chest Port 1 View  Result Date: 02/23/2018 CLINICAL DATA:  Aortic valve replacement.  Aortic aneurysm. EXAM: PORTABLE CHEST 1 VIEW COMPARISON:  02/22/2018 FINDINGS: Endotracheal tube remains in good position. Swan-Ganz catheter tip in the main pulmonary artery unchanged. Two chest tubes on the left unchanged. Mediastinal drain unchanged. Negative for pneumothorax. Mild bibasilar atelectasis and small left effusion unchanged. Negative for edema. IMPRESSION: Stable support apparatus. Negative for pneumothorax. Bibasilar atelectasis left greater than right unchanged. Electronically Signed   By: Franchot Gallo M.D.   On: 02/23/2018 11:07   Dg Chest Port 1 View  Result Date: 02/22/2018 CLINICAL DATA:  Post CABG EXAM: PORTABLE CHEST 1 VIEW COMPARISON:  Portable exam 1728 hours compared to 02/20/2018 FINDINGS: Tip of endotracheal tube projects 3.1 cm above carina.  Nasogastric tube extends into stomach. RIGHT jugular Swan-Ganz catheter with tip projecting over proximal RIGHT pulmonary artery. Mediastinal drains and LEFT thoracostomy tube present. Enlargement of cardiac silhouette post CABG and AVR. Mediastinal contours appear slightly prominent likely related to surgery and technique. Mild LEFT perihilar infiltrate versus atelectasis. No gross pleural effusion or pneumothorax. IMPRESSION: Expected postoperative changes as above. Electronically Signed   By: Lavonia Dana M.D.   On: 02/22/2018 17:46    Assessment/Plan  Stage 3 chronic kidney disease ([place])  Continue to renally adjust medications  Restart 0.9% NS at 75 mL/ hr x 48 hours  Refer to Nephrologist  Thoracic aortic aneurysm without rupture ([place]) S/P ascending aortic aneurysm repair  Stable   Non-rheumatic mitral regurgitation S/P biological Bentall aortic root replacement with bioprosthetic valve and synthetic root conduit + CABG x3  Stable   Coronary artery disease involving native coronary artery of native heart without angina pectoris S/P CABG x 3  Stable  Pneumonia of left lung due to infectious organism, unspecified part of lung  Azithromycin 500 mg po x 1, then 250 mg po Q Day x 4 days  Continue Xopenex 1.25 mg IH TID scheduled   Continue Xopenex 1.25 mg IH Q 4 hours prn  Continue Advair HFA 115-21- 2 puffs BID  Continue IS/TCDB 10 x/ hr while awake  Solumedrol 125 mg IM x 1  Start tomorrow- Prednisone taper 60 mg x 3, 40 mg x 3, 20 mg x 3, 10 mg x 2 days, then stop  Encounter for therapeutic drug monitoring  Coumadin 5 mg po Q Day  Recheck INR in am  Family/ staff Communication:   Total Time: 45 minutes  Documentation: 10 minutes  Face to Face: 35 minutes  Family/Phone:   Labs/tests ordered:  Cbc, met c, INR  Medication list reviewed and assessed for continued appropriateness. Monthly medication orders reviewed and signed.  Vikki Ports,  NP-C Geriatrics [facility] [phone], [place] [address] Cell Phone (Mon-Fri 8am-5pm):  [phone] On Call:  [phone] & follow prompts after 5pm & weekends Office Phone:  [phone] Office Fax:  [phone]

## 2018-03-16 ENCOUNTER — Other Ambulatory Visit
Admission: RE | Admit: 2018-03-16 | Discharge: 2018-03-16 | Disposition: A | Discharge status: Home / Self Care | Payer: Medicare HMO | Source: Ambulatory Visit | Attending: Gerontology | Admitting: Gerontology

## 2018-03-16 ENCOUNTER — Ambulatory Visit: Payer: Self-pay | Admitting: Urology

## 2018-03-16 DIAGNOSIS — I129 Hypertensive chronic kidney disease with stage 1 through stage 4 chronic kidney disease, or unspecified chronic kidney disease: Secondary | ICD-10-CM | POA: Insufficient documentation

## 2018-03-16 LAB — COMPREHENSIVE METABOLIC PANEL
ALT: 18 U/L (ref 17–63)
AST: 19 U/L (ref 15–41)
Albumin: 2.7 g/dL — ABNORMAL LOW (ref 3.5–5.0)
Alkaline Phosphatase: 55 U/L (ref 38–126)
Anion gap: 9 (ref 5–15)
BUN: 63 mg/dL — ABNORMAL HIGH (ref 6–20)
CO2: 25 mmol/L (ref 22–32)
Calcium: 7.8 mg/dL — ABNORMAL LOW (ref 8.9–10.3)
Chloride: 99 mmol/L — ABNORMAL LOW (ref 101–111)
Creatinine, Ser: 2.19 mg/dL — ABNORMAL HIGH (ref 0.61–1.24)
GFR calc Af Amer: 31 mL/min — ABNORMAL LOW (ref >60)
GFR calc non Af Amer: 27 mL/min — ABNORMAL LOW (ref >60)
Glucose, Bld: 156 mg/dL — ABNORMAL HIGH (ref 65–99)
Potassium: 4.2 mmol/L (ref 3.5–5.1)
Sodium: 133 mmol/L — ABNORMAL LOW (ref 135–145)
Total Bilirubin: 0.8 mg/dL (ref 0.3–1.2)
Total Protein: 6.1 g/dL — ABNORMAL LOW (ref 6.5–8.1)

## 2018-03-16 LAB — CBC WITH DIFFERENTIAL/PLATELET
Basophils Absolute: 0 10*3/uL (ref 0–0.1)
Basophils Relative: 0 %
Eosinophils Absolute: 0 10*3/uL (ref 0–0.7)
Eosinophils Relative: 0 %
HCT: 27.5 % — ABNORMAL LOW (ref 40.0–52.0)
Hemoglobin: 9.2 g/dL — ABNORMAL LOW (ref 13.0–18.0)
Lymphocytes Relative: 6 %
Lymphs Abs: 0.4 10*3/uL — ABNORMAL LOW (ref 1.0–3.6)
MCH: 31.3 pg (ref 26.0–34.0)
MCHC: 33.4 g/dL (ref 32.0–36.0)
MCV: 93.7 fL (ref 80.0–100.0)
Monocytes Absolute: 0.1 10*3/uL — ABNORMAL LOW (ref 0.2–1.0)
Monocytes Relative: 1 %
Neutro Abs: 5.9 10*3/uL (ref 1.4–6.5)
Neutrophils Relative %: 93 %
Platelets: 172 10*3/uL (ref 150–440)
RBC: 2.94 MIL/uL — ABNORMAL LOW (ref 4.40–5.90)
RDW: 14.2 % (ref 11.5–14.5)
WBC: 6.4 10*3/uL (ref 3.8–10.6)

## 2018-03-16 LAB — PROTIME-INR
INR: 1.41
Prothrombin Time: 17.1 seconds — ABNORMAL HIGH (ref 11.4–15.2)

## 2018-03-17 ENCOUNTER — Other Ambulatory Visit: Payer: Self-pay | Admitting: Internal Medicine

## 2018-03-17 NOTE — Telephone Encounter (Signed)
Please advise if ok to refill Doxazosin. 

## 2018-03-17 NOTE — Telephone Encounter (Signed)
Patient now on cardura 2 mg (1 tablet) by mouth once a day. Rx requested for refill was refused.

## 2018-03-18 ENCOUNTER — Other Ambulatory Visit
Admission: RE | Admit: 2018-03-18 | Discharge: 2018-03-18 | Disposition: A | Discharge status: Home / Self Care | Payer: Medicare HMO | Source: Skilled Nursing Facility | Attending: Family Medicine | Admitting: Family Medicine

## 2018-03-18 DIAGNOSIS — Z951 Presence of aortocoronary bypass graft: Secondary | ICD-10-CM | POA: Insufficient documentation

## 2018-03-18 DIAGNOSIS — Z48812 Encounter for surgical aftercare following surgery on the circulatory system: Secondary | ICD-10-CM | POA: Insufficient documentation

## 2018-03-18 DIAGNOSIS — Z953 Presence of xenogenic heart valve: Secondary | ICD-10-CM | POA: Insufficient documentation

## 2018-03-18 DIAGNOSIS — D649 Anemia, unspecified: Secondary | ICD-10-CM | POA: Insufficient documentation

## 2018-03-18 LAB — COMPREHENSIVE METABOLIC PANEL
ALT: 19 U/L (ref 17–63)
AST: 20 U/L (ref 15–41)
Albumin: 2.6 g/dL — ABNORMAL LOW (ref 3.5–5.0)
Alkaline Phosphatase: 52 U/L (ref 38–126)
Anion gap: 5 (ref 5–15)
BUN: 45 mg/dL — ABNORMAL HIGH (ref 6–20)
CO2: 26 mmol/L (ref 22–32)
Calcium: 7.7 mg/dL — ABNORMAL LOW (ref 8.9–10.3)
Chloride: 103 mmol/L (ref 101–111)
Creatinine, Ser: 1.54 mg/dL — ABNORMAL HIGH (ref 0.61–1.24)
GFR calc Af Amer: 48 mL/min — ABNORMAL LOW (ref >60)
GFR calc non Af Amer: 41 mL/min — ABNORMAL LOW (ref >60)
Glucose, Bld: 81 mg/dL (ref 65–99)
Potassium: 3.9 mmol/L (ref 3.5–5.1)
Sodium: 134 mmol/L — ABNORMAL LOW (ref 135–145)
Total Bilirubin: 0.8 mg/dL (ref 0.3–1.2)
Total Protein: 5.6 g/dL — ABNORMAL LOW (ref 6.5–8.1)

## 2018-03-18 LAB — CBC WITH DIFFERENTIAL/PLATELET
Basophils Absolute: 0 10*3/uL (ref 0–0.1)
Basophils Relative: 0 %
Eosinophils Absolute: 0 10*3/uL (ref 0–0.7)
Eosinophils Relative: 1 %
HCT: 26.9 % — ABNORMAL LOW (ref 40.0–52.0)
Hemoglobin: 9.2 g/dL — ABNORMAL LOW (ref 13.0–18.0)
Lymphocytes Relative: 17 %
Lymphs Abs: 1.1 10*3/uL (ref 1.0–3.6)
MCH: 31.9 pg (ref 26.0–34.0)
MCHC: 34.1 g/dL (ref 32.0–36.0)
MCV: 93.5 fL (ref 80.0–100.0)
Monocytes Absolute: 0.6 10*3/uL (ref 0.2–1.0)
Monocytes Relative: 9 %
Neutro Abs: 4.8 10*3/uL (ref 1.4–6.5)
Neutrophils Relative %: 73 %
Platelets: 157 10*3/uL (ref 150–440)
RBC: 2.88 MIL/uL — ABNORMAL LOW (ref 4.40–5.90)
RDW: 14.3 % (ref 11.5–14.5)
WBC: 6.5 10*3/uL (ref 3.8–10.6)

## 2018-03-18 LAB — PROTIME-INR
INR: 3.36
Prothrombin Time: 33.8 seconds — ABNORMAL HIGH (ref 11.4–15.2)

## 2018-03-20 ENCOUNTER — Other Ambulatory Visit
Admission: RE | Admit: 2018-03-20 | Discharge: 2018-03-20 | Disposition: A | Discharge status: Home / Self Care | Payer: Medicare HMO | Source: Ambulatory Visit | Attending: Gerontology | Admitting: Gerontology

## 2018-03-20 ENCOUNTER — Encounter: Payer: Self-pay | Admitting: Nurse Practitioner

## 2018-03-20 ENCOUNTER — Ambulatory Visit (INDEPENDENT_AMBULATORY_CARE_PROVIDER_SITE_OTHER): Payer: Medicare HMO | Admitting: Nurse Practitioner

## 2018-03-20 ENCOUNTER — Encounter: Payer: Self-pay | Admitting: Gerontology

## 2018-03-20 VITALS — BP 120/68 | HR 69 | Ht 62.0 in | Wt 161.8 lb

## 2018-03-20 DIAGNOSIS — Z953 Presence of xenogenic heart valve: Secondary | ICD-10-CM

## 2018-03-20 DIAGNOSIS — E782 Mixed hyperlipidemia: Secondary | ICD-10-CM

## 2018-03-20 DIAGNOSIS — Z951 Presence of aortocoronary bypass graft: Secondary | ICD-10-CM | POA: Diagnosis not present

## 2018-03-20 DIAGNOSIS — Z7901 Long term (current) use of anticoagulants: Secondary | ICD-10-CM | POA: Diagnosis not present

## 2018-03-20 DIAGNOSIS — I129 Hypertensive chronic kidney disease with stage 1 through stage 4 chronic kidney disease, or unspecified chronic kidney disease: Secondary | ICD-10-CM | POA: Diagnosis present

## 2018-03-20 DIAGNOSIS — I10 Essential (primary) hypertension: Secondary | ICD-10-CM

## 2018-03-20 DIAGNOSIS — D649 Anemia, unspecified: Secondary | ICD-10-CM

## 2018-03-20 DIAGNOSIS — I2510 Atherosclerotic heart disease of native coronary artery without angina pectoris: Secondary | ICD-10-CM

## 2018-03-20 DIAGNOSIS — N183 Chronic kidney disease, stage 3 unspecified: Secondary | ICD-10-CM

## 2018-03-20 LAB — COMPREHENSIVE METABOLIC PANEL
ALT: 20 U/L (ref 17–63)
AST: 22 U/L (ref 15–41)
Albumin: 2.8 g/dL — ABNORMAL LOW (ref 3.5–5.0)
Alkaline Phosphatase: 53 U/L (ref 38–126)
Anion gap: 8 (ref 5–15)
BUN: 61 mg/dL — ABNORMAL HIGH (ref 6–20)
CO2: 29 mmol/L (ref 22–32)
Calcium: 7.8 mg/dL — ABNORMAL LOW (ref 8.9–10.3)
Chloride: 97 mmol/L — ABNORMAL LOW (ref 101–111)
Creatinine, Ser: 2.21 mg/dL — ABNORMAL HIGH (ref 0.61–1.24)
GFR calc Af Amer: 31 mL/min — ABNORMAL LOW (ref >60)
GFR calc non Af Amer: 27 mL/min — ABNORMAL LOW (ref >60)
Glucose, Bld: 91 mg/dL (ref 65–99)
Potassium: 3.6 mmol/L (ref 3.5–5.1)
Sodium: 134 mmol/L — ABNORMAL LOW (ref 135–145)
Total Bilirubin: 0.7 mg/dL (ref 0.3–1.2)
Total Protein: 6 g/dL — ABNORMAL LOW (ref 6.5–8.1)

## 2018-03-20 LAB — CBC WITH DIFFERENTIAL/PLATELET
Basophils Absolute: 0 10*3/uL (ref 0–0.1)
Basophils Relative: 0 %
Eosinophils Absolute: 0 10*3/uL (ref 0–0.7)
Eosinophils Relative: 0 %
HCT: 29 % — ABNORMAL LOW (ref 40.0–52.0)
Hemoglobin: 9.8 g/dL — ABNORMAL LOW (ref 13.0–18.0)
Lymphocytes Relative: 16 %
Lymphs Abs: 1.2 10*3/uL (ref 1.0–3.6)
MCH: 31.4 pg (ref 26.0–34.0)
MCHC: 33.6 g/dL (ref 32.0–36.0)
MCV: 93.4 fL (ref 80.0–100.0)
Monocytes Absolute: 0.5 10*3/uL (ref 0.2–1.0)
Monocytes Relative: 7 %
Neutro Abs: 5.9 10*3/uL (ref 1.4–6.5)
Neutrophils Relative %: 77 %
Platelets: 141 10*3/uL — ABNORMAL LOW (ref 150–440)
RBC: 3.11 MIL/uL — ABNORMAL LOW (ref 4.40–5.90)
RDW: 14.4 % (ref 11.5–14.5)
WBC: 7.7 10*3/uL (ref 3.8–10.6)

## 2018-03-20 LAB — PROTIME-INR
INR: 7.01
Prothrombin Time: 60 seconds — ABNORMAL HIGH (ref 11.4–15.2)

## 2018-03-20 NOTE — Progress Notes (Signed)
Office Visit    Patient Name: Rodney Wiley Date of Encounter: 03/20/2018  Primary Care Provider:  Remi Haggard, FNP Primary Cardiologist:  Nelva Bush, MD  Chief Complaint    82 year old male with a history of aortic insufficiency, ascending aortic aneurysm, stage III chronic kidney disease, hypertension, hyperlipidemia, and left renal artery stenosis who is also recently status post biological Bentall aortic root replacement with bioprosthetic aortic valve replacement, resuspension of the left main, and CABG x3, who presents for follow-up.  Past Medical History    Past Medical History:  Diagnosis Date  . Aortic insufficiency    a. 02/2018 s/p bioprosthetic AVR.  [name] Kitchen Arthropathy   . Ascending aortic aneurysm ([place])    a. 12/2016 MRA: 4.3cm @ sinus of valsalva, 4.9cm above sinotubular jxn; b. 02/2018 s/p biological Bentall and AVR.  [name] Kitchen BPH (benign prostatic hyperplasia)   . 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 02/2018 Cath: LAD 20ost, 40p, 100m, LCX 60p, OM2 60, OM3 85, RCA 10p/m w/ L->R and R->R collats;  b. 02/2018 CABG x 3: LIMA to LAD, SVG to OM2, SVG to PDA, EVH via right thigh.  . CKD (chronic kidney disease), stage III ([place])   . Diverticulitis   . Essential hypertension   . GERD (gastroesophageal reflux disease)   . Hemorrhoids   . History of chicken pox   . History of Helicobacter pylori infection 06/2007  . Hyperlipidemia    unspecified  . Hypertension   . Left renal artery stenosis ([facility])    a. 01/2017 RA u/s: moderate L RAS.  [name] Kitchen Lower extremity edema   . Nonrheumatic mitral (valve) insufficiency    a. 12/2016 Echo: mild to mod MR; b. 09/2017 TEE: mild to mod MR.  [name] Kitchen Nonrheumatic pulmonary valve insufficiency   . 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 s/p 21 mm Edwards Inspiris Resilia stented bovine pericardial tissue valve and 24 mm Gelweave Valsalva synthetic root conduit with reimplantation  of left main coronary artery.   Past Surgical History:  Procedure Laterality Date  . AORTIC VALVE REPAIR N/A 02/22/2018   Procedure: AORTIC VALVE REPLACEMENT -Biological Bentall aortic root replacement,;  Surgeon: Rexene Alberts, MD;  Location: [facility];  Service: Open Heart Surgery;  Laterality: N/A;  . CARDIAC CATHETERIZATION     01/16/2018  . COLONOSCOPY  2011   by Dr. Candace Cruise with findings of diverticulosis  . CORONARY ARTERY BYPASS GRAFT N/A 02/22/2018   Procedure: CORONARY ARTERY BYPASS GRAFTING (CABG) x three, using left internal mammary artery and right leg greater saphenous vein harvested endoscopically;  Surgeon: Rexene Alberts, MD;  Location: [facility];  Service: Open Heart Surgery;  Laterality: N/A;  . CORONARY/GRAFT ANGIOGRAPHY N/A 01/16/2018   Procedure: CORONARY/GRAFT ANGIOGRAPHY;  Surgeon: Nelva Bush, MD;  Location: [facility] CV LAB;  Service: Cardiovascular;  Laterality: N/A;  . ELBOW SURGERY Left   . hemorhoidectomy    . RIGHT HEART CATH N/A 01/16/2018   Procedure: RIGHT HEART CATH;  Surgeon: Nelva Bush, MD;  Location: [facility] LAB;  Service: Cardiovascular;  Laterality: N/A;  . SHOULDER ARTHROSCOPY WITH ROTATOR CUFF REPAIR Right   . SHOULDER SURGERY Right   . TEE WITHOUT CARDIOVERSION N/A 09/27/2017   Procedure: TRANSESOPHAGEAL ECHOCARDIOGRAM (TEE);  Surgeon: Dorothy Spark, MD;  [facility] ENDOSCOPY;  Service: Cardiovascular;  Laterality: N/A;  . TEE WITHOUT CARDIOVERSION N/A 02/22/2018   Procedure: TRANSESOPHAGEAL ECHOCARDIOGRAM (TEE);  Surgeon: Rexene Alberts, MD;  Location: [facility];  Service: Open Heart Surgery;  Laterality: N/A;  . THORACIC AORTIC ANEURYSM REPAIR N/A 02/22/2018   Procedure: THORACIC ASCENDING ANEURYSM REPAIR (AAA) Resection of ascending aorta aneurysm;  Surgeon: Rexene Alberts, MD;  Location: [facility] OR;  Service: Open Heart Surgery;  Laterality: N/A;    Allergies  No Known Allergies  History of Present Illness    82 year old male  with the above complex past medical history including aortic insufficiency, ascending aortic aneurysm, stage III chronic kidney disease, hypertension, hyperlipidemia, and left renal artery stenosis.  He has been followed closely by Dr. Saunders Revel and was subsequently referred to CT surgery earlier this year.  Following review of recent TEE and cardiac CT angiography it was felt that the patient would benefit from surgical intervention.  He subsequently underwent diagnostic catheterization which revealed multivessel coronary artery disease.  On March 20, he underwent biological Bentall.  Aortic root replacement with bioprosthetic aortic valve, CABG x3, and reimplantation of the left main coronary artery.  Postoperative course was complicated by anemia requiring transfusion along with paroxysmal atrial fibrillation, and mild worsening of renal failure.  He was subsequently discharged to a rehab facility on March 29.  Patient and family say that there were a few days where he had excess volume and his diuretic dosing was adjusted but he has since done well and was discharged from rehab earlier this morning.  While there, he had CBCs, basic metabolic panels, and INRs checked almost every other day.  In that setting, he has been persistently anemic with hemoglobins trending in the 9/26-29 range.  His creatinines have remained variable but for the most part he is in the low to mid 2 range.  He says he has been healing well and has minimal chest wall discomfort related to his surgical incision.  He says with all the activity around being discharged and not sleeping well 2 nights ago, he is a little bit tired today but overall feels much better.  He has not had any angina, dyspnea, palpitations, PND, orthopnea, dizziness, syncope, or early satiety.  Of note, his INR earlier today was 7.01.  This was drawn at rehab and he was treated with an unknown dose of vitamin K while there.  He has not been having any dark stools, blood in  stools, hematuria, or other sources of bleeding.  Home Medications    Prior to Admission medications   Medication Sig Start Date End Date Taking? Authorizing Provider  Amino Acids-Protein Hydrolys (FEEDING SUPPLEMENT, PRO-STAT SUGAR FREE 64,) LIQD Take 30 mLs by mouth 2 (two) times daily between meals. low protein, low albumin   Yes [provider]  amiodarone (PACERONE) 200 MG tablet Take 1 tablet (200 mg total) by mouth 2 (two) times daily after a meal. 03/03/18  Yes Gold, Wayne E, PA-C  atorvastatin (LIPITOR) 20 MG tablet Take 1 tablet (20 mg total) by mouth daily. 03/09/18  Yes End, Harrell Gave, MD  carvedilol (COREG) 3.125 MG tablet Take 3.125 mg by mouth 2 (two) times daily with a meal.   Yes [provider]  Cholecalciferol (VITAMIN D3) 2000 units capsule Take 2,000 Units by mouth daily.    Yes [provider]  dextromethorphan-guaiFENesin (MUCINEX DM) 30-600 MG 12hr tablet Take 1 tablet by mouth 2 (two) times daily. 03/03/18  Yes Gold, Wayne E, PA-C  doxazosin (CARDURA) 2 MG tablet Take 2 mg by mouth daily.   Yes [provider]  ferrous sulfate  325 (65 FE) MG EC tablet Take 325 mg by mouth 2 (two) times daily.    Yes [provider]  fluticasone-salmeterol (ADVAIR HFA) 115-21 MCG/ACT inhaler Inhale 2 puffs into the lungs 2 (two) times daily. Use with spacer/ Aerochamber   Yes [provider]  Fluticasone-Salmeterol (ADVAIR) 250-50 MCG/DOSE AEPB Inhale 1 puff into the lungs 2 (two) times daily.   Yes [provider]  folic acid-pyridoxine-cyancobalamin (FOLTX) 2.5-25-2 MG TABS tablet Take 1 tablet by mouth daily. 03/04/18  Yes Gold, Wayne E, PA-C  levalbuterol (XOPENEX) 1.25 MG/3ML nebulizer solution Take 1.25 mg by nebulization every 4 (four) hours as needed.   Yes [provider]  Nutritional Supplements (ENSURE ENLIVE PO) Take 1 Bottle by mouth 2 (two) times daily between meals.   Yes [provider]  oxyCODONE  (OXY IR/ROXICODONE) 5 MG immediate release tablet Take 1-2 tablets (5-10 mg total) by mouth every 6 (six) hours as needed for severe pain. 03/03/18  Yes Gold, Wayne E, PA-C  potassium chloride (K-DUR,KLOR-CON) 10 MEQ tablet Take 10 mEq by mouth daily.   Yes [provider]  sennosides-docusate sodium (SENOKOT-S) 8.6-50 MG tablet Take 1 tablet by mouth 2 (two) times daily.    Yes [provider]  torsemide (DEMADEX) 20 MG tablet Take 20 mg by mouth daily.   Yes [provider]  vitamin C (ASCORBIC ACID) 500 MG tablet Take 500 mg by mouth 2 (two) times daily.   Yes [provider]  warfarin (COUMADIN) 5 MG tablet Take 5 mg by mouth daily.   Yes [provider]    Review of Systems    Overall doing well since surgery.  He has minimal chest wall discomfort.  He denies angina, palpitations, dyspnea, PND, orthopnea, dizziness, syncope, edema, or early satiety.  All other systems reviewed and are otherwise negative except as noted above.  Physical Exam    VS:  BP 120/68 (BP Location: Left Arm, Patient Position: Sitting, Cuff Size: Normal)   Pulse 69   Ht 5\' 2"  (1.575 m)   Wt 161 lb 12 oz (73.4 kg)   BMI 29.58 kg/m   , BMI Body mass index is 29.58 kg/m. GEN: Well nourished, well developed, in no acute distress.  HEENT: normal.  Neck: Supple, no JVD, carotid bruits, or masses. Cardiac: RRR, 2/6 systolic ejection murmur noted at the upper sternal borders, no rubs, or gallops. No clubbing, cyanosis, edema.  Radials/DP/PT 2+ and equal bilaterally.  Respiratory:  Respirations regular and unlabored, clear to auscultation bilaterally. GI: Soft, nontender, nondistended, BS + x 4. MS: no deformity or atrophy. Skin: warm and dry, no rash.  Sternal, abdominal, and right lower leg incisions are healing well without heat, erythema, or drainage. Neuro:  Strength and sensation are intact. Psych: Normal affect.  Accessory Clinical Findings    ECG -regular sinus  rhythm, 69, first-degree AV block, left axis, LVH, no acute changes.  Assessment & Plan    1.  Moderate to severe aortic insufficiency: Status post bioprosthetic aortic valve replacement with Bentall procedure and CABG x3.  Patient initially discharged to rehab and subsequently discharged home today.  Overall he has been doing well and surgical sites have been healing well.  He has not been having any angina or dyspnea.  He is euvolemic today.  I will arrange for a follow-up echo to establish new baseline postoperatively.  He is currently on a 76-month course of Coumadin following bioprosthetic valve.  In that setting, INR was  over 7 today. This was managed by the rehab facility and he was given an unknown dose of vitamin K.  We have arranged for follow-up in our Coumadin clinic on Wednesday and he has been advised to hold Coumadin until then.  2.  Thoracic aortic aneurysm: Status post biological Bentall.  As above, doing well.  3.  Coronary artery disease: Status post three-vessel CABG with resuspension of the left main coronary artery at the time of above outlined procedures.  He has not been having any angina or dyspnea.  He did not have any angina preoperatively.  He remains on statin, beta-blocker.  No aspirin in the setting of a short course of Coumadin.  Would plan to add aspirin 81  once off Coumadin.  4.  Postoperative atrial fibrillation: He is in sinus rhythm today.  He is not aware of any palpitations.  He is currently on amiodarone 200 mg daily.  If he has any recurrence of atrial fibrillation, we would likely plan to continue oral anticoagulation indefinitely.  5.  Essential hypertension: Stable on beta-blocker therapy.  6.  Hyperlipidemia: LDL was 39 on March 11, 2017.  Continue statin therapy.  7.  Stage III-IV chronic kidney disease: This has been followed closely while at rehab with creatinines typically trending in the low to mid 2 range.  Creatinine was 2.21 earlier this morning  with a BUN of 61.  He is on torsemide therapy at 20 mg daily and has follow-up with nephrology later this week.    8.  Normocytic anemia: Patient did require transfusion postoperatively but hemoglobins and hematocrits have been relatively stable since.  He has not been having any bleeding/melena/bright red blood per rectum.  Continue iron and Foltx.  9.  Coagulopathy:  See above - coumadin on hold until further notice w/ f/u in coumadin clinic on Wed.  10. Disposition: I will arrange for follow-up CBC in about 2 weeks.  Nephrology to follow renal function.  Coumadin clinic follow-up in 2 days.  Follow-up with Dr. Saunders Revel in 6 to 8 weeks or sooner if necessary.  Murray Hodgkins, NP 03/20/2018, 4:50 PM

## 2018-03-20 NOTE — Patient Instructions (Addendum)
.  Medication Instructions:  Your physician has recommended you make the following change in your medication:  1- HOLD Coumadin until you receive further instructions on Wednesday.   Labwork: Your physician recommends that you return for lab work in: [place]. (Can be on the same day as echo).   Testing/Procedures: Your physician has requested that you have an echocardiogram in 2 WEEKS ON SAME DAY AS ECHO IF POSSIBLE.  Echocardiography is a painless test that uses sound waves to create images of your heart. It provides your doctor with information about the size and shape of your heart and how well your heart's chambers and valves are working. This procedure takes approximately one hour. There are no restrictions for this procedure.    Follow-Up: Your physician recommends that you schedule a follow-up appointment WITH COUMADIN CLINIC ON Wednesday.  Your physician recommends that you schedule a follow-up appointment in: 6-8 [place].    If you need a refill on your cardiac medications before your next appointment, please call your pharmacy.

## 2018-03-21 DIAGNOSIS — R69 Illness, unspecified: Secondary | ICD-10-CM | POA: Diagnosis not present

## 2018-03-22 ENCOUNTER — Other Ambulatory Visit: Payer: Self-pay | Admitting: Thoracic Surgery (Cardiothoracic Vascular Surgery)

## 2018-03-22 ENCOUNTER — Ambulatory Visit (INDEPENDENT_AMBULATORY_CARE_PROVIDER_SITE_OTHER): Payer: Medicare HMO

## 2018-03-22 DIAGNOSIS — Z7901 Long term (current) use of anticoagulants: Secondary | ICD-10-CM | POA: Diagnosis not present

## 2018-03-22 DIAGNOSIS — Z953 Presence of xenogenic heart valve: Secondary | ICD-10-CM

## 2018-03-22 DIAGNOSIS — N189 Chronic kidney disease, unspecified: Secondary | ICD-10-CM | POA: Diagnosis not present

## 2018-03-22 DIAGNOSIS — D649 Anemia, unspecified: Secondary | ICD-10-CM | POA: Diagnosis not present

## 2018-03-22 DIAGNOSIS — Z951 Presence of aortocoronary bypass graft: Secondary | ICD-10-CM

## 2018-03-22 LAB — POCT INR: INR: 3.9

## 2018-03-22 NOTE — Progress Notes (Signed)
I had a 10 minute telephone conversation with pt's Daughter-In-law regarding pt's discharge plans. Family plans to pick up pt for discharge today after Cardiology appointment. I discussed my concerns over his d/c today as his INR was 7.01. He was just given a 1.25 mg Mephy[place] po dose. I advised it would be safer for pt to stay at least until the morning so we could recheck the INR to make sure it was trending down. I discussed my concern about his instability and persistent weakness r/t to the surgery: concerned about falls, especially hitting his head and causing intracerebral bleed, lacerations, etc. I discussed I would prefer he stay in the facility to allow staff to assist with ambulation to minimize risk for falls, etc. I assured her he could d/c in the am as long as INR trended down and we could have Cardiology continue to manage his INRs, medications, etc. Daughter in Law verbalized she and the son were concerned about the cost of pt staying without insurance coverage. She said she understood my concerns and would discuss this with the pt's son and let us know their decision. I emphasized the importance of discussing the INR results with the cardiologist and the families plan for discharge. Cardiology has access to the lab results in [place].

## 2018-03-22 NOTE — Patient Instructions (Signed)
Please skip coumadin tonight, then START NEW DOSAGE of 1 tablet every day except 1/2 pill on Mondays & Fridays.  Recheck INR on Monday.

## 2018-03-23 ENCOUNTER — Encounter: Payer: Medicare HMO | Admitting: Oncology

## 2018-03-23 DIAGNOSIS — E46 Unspecified protein-calorie malnutrition: Secondary | ICD-10-CM | POA: Diagnosis not present

## 2018-03-23 DIAGNOSIS — N183 Chronic kidney disease, stage 3 (moderate): Secondary | ICD-10-CM | POA: Diagnosis not present

## 2018-03-23 DIAGNOSIS — I712 Thoracic aortic aneurysm, without rupture: Secondary | ICD-10-CM | POA: Diagnosis not present

## 2018-03-23 DIAGNOSIS — I2510 Atherosclerotic heart disease of native coronary artery without angina pectoris: Secondary | ICD-10-CM | POA: Diagnosis not present

## 2018-03-23 DIAGNOSIS — N400 Enlarged prostate without lower urinary tract symptoms: Secondary | ICD-10-CM | POA: Diagnosis not present

## 2018-03-23 DIAGNOSIS — I340 Nonrheumatic mitral (valve) insufficiency: Secondary | ICD-10-CM | POA: Diagnosis not present

## 2018-03-23 DIAGNOSIS — Z48812 Encounter for surgical aftercare following surgery on the circulatory system: Secondary | ICD-10-CM | POA: Diagnosis not present

## 2018-03-23 DIAGNOSIS — I440 Atrioventricular block, first degree: Secondary | ICD-10-CM | POA: Diagnosis not present

## 2018-03-23 DIAGNOSIS — I371 Nonrheumatic pulmonary valve insufficiency: Secondary | ICD-10-CM | POA: Diagnosis not present

## 2018-03-23 DIAGNOSIS — I129 Hypertensive chronic kidney disease with stage 1 through stage 4 chronic kidney disease, or unspecified chronic kidney disease: Secondary | ICD-10-CM | POA: Diagnosis not present

## 2018-03-24 ENCOUNTER — Telehealth: Payer: Self-pay | Admitting: Nurse Practitioner

## 2018-03-24 ENCOUNTER — Inpatient Hospital Stay: Payer: Medicare HMO | Attending: Oncology | Admitting: Oncology

## 2018-03-24 ENCOUNTER — Inpatient Hospital Stay: Payer: Medicare HMO

## 2018-03-24 ENCOUNTER — Encounter: Payer: Self-pay | Admitting: Oncology

## 2018-03-24 VITALS — BP 107/77 | HR 89 | Temp 97.9°F | Resp 18 | Ht 62.0 in | Wt 159.3 lb

## 2018-03-24 DIAGNOSIS — I129 Hypertensive chronic kidney disease with stage 1 through stage 4 chronic kidney disease, or unspecified chronic kidney disease: Secondary | ICD-10-CM | POA: Diagnosis not present

## 2018-03-24 DIAGNOSIS — D696 Thrombocytopenia, unspecified: Secondary | ICD-10-CM | POA: Insufficient documentation

## 2018-03-24 DIAGNOSIS — N183 Chronic kidney disease, stage 3 (moderate): Secondary | ICD-10-CM | POA: Diagnosis not present

## 2018-03-24 DIAGNOSIS — D638 Anemia in other chronic diseases classified elsewhere: Secondary | ICD-10-CM

## 2018-03-24 DIAGNOSIS — D649 Anemia, unspecified: Secondary | ICD-10-CM | POA: Diagnosis not present

## 2018-03-24 LAB — COMPREHENSIVE METABOLIC PANEL
ALT: 28 U/L (ref 17–63)
AST: 27 U/L (ref 15–41)
Albumin: 3.1 g/dL — ABNORMAL LOW (ref 3.5–5.0)
Alkaline Phosphatase: 69 U/L (ref 38–126)
Anion gap: 10 (ref 5–15)
BUN: 46 mg/dL — ABNORMAL HIGH (ref 6–20)
CO2: 27 mmol/L (ref 22–32)
Calcium: 8.4 mg/dL — ABNORMAL LOW (ref 8.9–10.3)
Chloride: 95 mmol/L — ABNORMAL LOW (ref 101–111)
Creatinine, Ser: 2.44 mg/dL — ABNORMAL HIGH (ref 0.61–1.24)
GFR calc Af Amer: 27 mL/min — ABNORMAL LOW (ref >60)
GFR calc non Af Amer: 24 mL/min — ABNORMAL LOW (ref >60)
Glucose, Bld: 171 mg/dL — ABNORMAL HIGH (ref 65–99)
Potassium: 3.6 mmol/L (ref 3.5–5.1)
Sodium: 132 mmol/L — ABNORMAL LOW (ref 135–145)
Total Bilirubin: 1.2 mg/dL (ref 0.3–1.2)
Total Protein: 6.8 g/dL (ref 6.5–8.1)

## 2018-03-24 LAB — CBC WITH DIFFERENTIAL/PLATELET
Basophils Absolute: 0 10*3/uL (ref 0–0.1)
Basophils Relative: 1 %
Eosinophils Absolute: 0.2 10*3/uL (ref 0–0.7)
Eosinophils Relative: 2 %
HCT: 34.1 % — ABNORMAL LOW (ref 40.0–52.0)
Hemoglobin: 11.4 g/dL — ABNORMAL LOW (ref 13.0–18.0)
Lymphocytes Relative: 12 %
Lymphs Abs: 0.9 10*3/uL — ABNORMAL LOW (ref 1.0–3.6)
MCH: 31.4 pg (ref 26.0–34.0)
MCHC: 33.4 g/dL (ref 32.0–36.0)
MCV: 93.9 fL (ref 80.0–100.0)
Monocytes Absolute: 0.6 10*3/uL (ref 0.2–1.0)
Monocytes Relative: 7 %
Neutro Abs: 6 10*3/uL (ref 1.4–6.5)
Neutrophils Relative %: 78 %
Platelets: 134 10*3/uL — ABNORMAL LOW (ref 150–440)
RBC: 3.63 MIL/uL — ABNORMAL LOW (ref 4.40–5.90)
RDW: 14.9 % — ABNORMAL HIGH (ref 11.5–14.5)
WBC: 7.7 10*3/uL (ref 3.8–10.6)

## 2018-03-24 LAB — IRON AND TIBC
Iron: 51 ug/dL (ref 45–182)
Saturation Ratios: 19 % (ref 17.9–39.5)
TIBC: 263 ug/dL (ref 250–450)
UIBC: 212 ug/dL

## 2018-03-24 LAB — RETICULOCYTES
RBC.: 3.6 MIL/uL — ABNORMAL LOW (ref 4.40–5.90)
Retic Count, Absolute: 72 10*3/uL (ref 19.0–183.0)
Retic Ct Pct: 2 % (ref 0.4–3.1)

## 2018-03-24 LAB — TSH: TSH: 3.262 u[IU]/mL (ref 0.350–4.500)

## 2018-03-24 LAB — FOLATE: Folate: 74.8 ng/mL (ref >5.9)

## 2018-03-24 LAB — VITAMIN B12: Vitamin B-12: 2397 pg/mL — ABNORMAL HIGH (ref 180–914)

## 2018-03-24 LAB — TECHNOLOGIST SMEAR REVIEW

## 2018-03-24 LAB — FERRITIN: Ferritin: 303 ng/mL (ref 24–336)

## 2018-03-24 NOTE — Telephone Encounter (Signed)
Spoke with patients daughter per release form and advised that she should have patient hold carvedilol and torsemide until appointment on Monday 03/27/18 at 3:30PM here in our office with Ignacia Bayley NP. She verbalized understanding of instructions, was appreciative for the call, and and no further questions at this time.

## 2018-03-24 NOTE — Telephone Encounter (Signed)
Pt daughter in law states that pt BP was 88/67, this morning was 101/77. States he has not given his medication as of yet. States he was at the cancer center this am and he got "very hot" which she states is unusual for him. States his temp was 97.4

## 2018-03-24 NOTE — Telephone Encounter (Signed)
Please have Mr. Kozakiewicz hold his carvedilol and torsemide and see if he can be seen early next week.  If his symptoms worsen, he will need to go to the ED.  Nelva Bush, MD [facility] Pager: [phone]

## 2018-03-24 NOTE — Telephone Encounter (Signed)
She states that yesterday he took his medications and a nurse visit checked his blood pressure and it was 88/76 after his medications. This morning he was at hematology and he reported he was sweating and hot which is not like him. She states this is not like him at all and she is concerned. Today they gave meds except blood pressure pills and when he ate then he started throwing up. She reports that he has lost about 20 lbs and that his diet/appetite has not changed. Weight was 161 and today 158.3. The low blood pressures, throwing up, and his medications has her very concerned. Advised that I would send this to his providers and that if his symptoms persist or worsen to go to ED and that I would call her back if they have any suggestions. She was appreciative for the call and verbalized understanding of our conversation.

## 2018-03-25 LAB — HAPTOGLOBIN: Haptoglobin: 170 mg/dL (ref 34–200)

## 2018-03-27 ENCOUNTER — Ambulatory Visit
Admission: RE | Admit: 2018-03-27 | Discharge: 2018-03-27 | Disposition: A | Discharge status: Home / Self Care | Payer: Medicare HMO | Source: Ambulatory Visit | Attending: Thoracic Surgery (Cardiothoracic Vascular Surgery) | Admitting: Thoracic Surgery (Cardiothoracic Vascular Surgery)

## 2018-03-27 ENCOUNTER — Telehealth: Payer: Self-pay | Admitting: Cardiology

## 2018-03-27 ENCOUNTER — Ambulatory Visit: Payer: Medicare HMO | Admitting: Nurse Practitioner

## 2018-03-27 ENCOUNTER — Other Ambulatory Visit
Admission: RE | Admit: 2018-03-27 | Discharge: 2018-03-27 | Disposition: A | Discharge status: Home / Self Care | Payer: Medicare HMO | Source: Ambulatory Visit | Attending: Nurse Practitioner | Admitting: Nurse Practitioner

## 2018-03-27 ENCOUNTER — Ambulatory Visit (INDEPENDENT_AMBULATORY_CARE_PROVIDER_SITE_OTHER): Payer: Self-pay | Admitting: Surgical

## 2018-03-27 ENCOUNTER — Ambulatory Visit (INDEPENDENT_AMBULATORY_CARE_PROVIDER_SITE_OTHER): Payer: Medicare HMO

## 2018-03-27 ENCOUNTER — Encounter: Payer: Self-pay | Admitting: Oncology

## 2018-03-27 ENCOUNTER — Telehealth: Payer: Self-pay | Admitting: Internal Medicine

## 2018-03-27 ENCOUNTER — Encounter: Payer: Self-pay | Admitting: Nurse Practitioner

## 2018-03-27 ENCOUNTER — Other Ambulatory Visit: Payer: Self-pay

## 2018-03-27 VITALS — BP 120/68 | HR 93 | Ht 67.0 in | Wt 161.8 lb

## 2018-03-27 VITALS — BP 103/75 | HR 80 | Resp 16 | Ht 64.0 in | Wt 160.0 lb

## 2018-03-27 DIAGNOSIS — Z953 Presence of xenogenic heart valve: Secondary | ICD-10-CM

## 2018-03-27 DIAGNOSIS — I351 Nonrheumatic aortic (valve) insufficiency: Secondary | ICD-10-CM | POA: Diagnosis not present

## 2018-03-27 DIAGNOSIS — I480 Paroxysmal atrial fibrillation: Secondary | ICD-10-CM

## 2018-03-27 DIAGNOSIS — I10 Essential (primary) hypertension: Secondary | ICD-10-CM | POA: Diagnosis not present

## 2018-03-27 DIAGNOSIS — D649 Anemia, unspecified: Secondary | ICD-10-CM | POA: Diagnosis not present

## 2018-03-27 DIAGNOSIS — J90 Pleural effusion, not elsewhere classified: Secondary | ICD-10-CM | POA: Diagnosis not present

## 2018-03-27 DIAGNOSIS — D689 Coagulation defect, unspecified: Secondary | ICD-10-CM | POA: Diagnosis not present

## 2018-03-27 DIAGNOSIS — I9589 Other hypotension: Secondary | ICD-10-CM | POA: Diagnosis not present

## 2018-03-27 DIAGNOSIS — Z951 Presence of aortocoronary bypass graft: Secondary | ICD-10-CM

## 2018-03-27 DIAGNOSIS — Z7901 Long term (current) use of anticoagulants: Secondary | ICD-10-CM

## 2018-03-27 DIAGNOSIS — N183 Chronic kidney disease, stage 3 unspecified: Secondary | ICD-10-CM

## 2018-03-27 DIAGNOSIS — I5032 Chronic diastolic (congestive) heart failure: Secondary | ICD-10-CM | POA: Diagnosis not present

## 2018-03-27 DIAGNOSIS — E861 Hypovolemia: Secondary | ICD-10-CM | POA: Diagnosis not present

## 2018-03-27 DIAGNOSIS — I2510 Atherosclerotic heart disease of native coronary artery without angina pectoris: Secondary | ICD-10-CM | POA: Diagnosis not present

## 2018-03-27 LAB — BASIC METABOLIC PANEL
Anion gap: 8 (ref 5–15)
BUN: 25 mg/dL — ABNORMAL HIGH (ref 6–20)
CO2: 27 mmol/L (ref 22–32)
Calcium: 8.2 mg/dL — ABNORMAL LOW (ref 8.9–10.3)
Chloride: 101 mmol/L (ref 101–111)
Creatinine, Ser: 2.12 mg/dL — ABNORMAL HIGH (ref 0.61–1.24)
GFR calc Af Amer: 32 mL/min — ABNORMAL LOW (ref >60)
GFR calc non Af Amer: 28 mL/min — ABNORMAL LOW (ref >60)
Glucose, Bld: 118 mg/dL — ABNORMAL HIGH (ref 65–99)
Potassium: 4.2 mmol/L (ref 3.5–5.1)
Sodium: 136 mmol/L (ref 135–145)

## 2018-03-27 LAB — CBC WITH DIFFERENTIAL/PLATELET
Basophils Absolute: 0 10*3/uL (ref 0–0.1)
Basophils Relative: 1 %
Eosinophils Absolute: 0.1 10*3/uL (ref 0–0.7)
Eosinophils Relative: 2 %
HCT: 31.7 % — ABNORMAL LOW (ref 40.0–52.0)
Hemoglobin: 10.9 g/dL — ABNORMAL LOW (ref 13.0–18.0)
Lymphocytes Relative: 13 %
Lymphs Abs: 0.8 10*3/uL — ABNORMAL LOW (ref 1.0–3.6)
MCH: 32.2 pg (ref 26.0–34.0)
MCHC: 34.4 g/dL (ref 32.0–36.0)
MCV: 93.5 fL (ref 80.0–100.0)
Monocytes Absolute: 0.5 10*3/uL (ref 0.2–1.0)
Monocytes Relative: 8 %
Neutro Abs: 4.7 10*3/uL (ref 1.4–6.5)
Neutrophils Relative %: 76 %
Platelets: 135 10*3/uL — ABNORMAL LOW (ref 150–440)
RBC: 3.39 MIL/uL — ABNORMAL LOW (ref 4.40–5.90)
RDW: 15.7 % — ABNORMAL HIGH (ref 11.5–14.5)
WBC: 6.2 10*3/uL (ref 3.8–10.6)

## 2018-03-27 LAB — PROTIME-INR
INR: 7.53
Prothrombin Time: 63.4 seconds — ABNORMAL HIGH (ref 11.4–15.2)

## 2018-03-27 LAB — POCT INR: INR: >8

## 2018-03-27 NOTE — Patient Instructions (Addendum)
Please proceed to the [facility] for STAT PT/INR draw. Hold coumadin until recheck on Wednesday. HAVE A LARGE SERVING OF [place] TODAY.

## 2018-03-27 NOTE — Telephone Encounter (Signed)
Patient daughter calling Patient cannot make the rescheduled time on 4/22 to see C. [name] but needs to be seen this week - would also like to know if he should continue to stop taking medication  Please advise

## 2018-03-27 NOTE — Patient Instructions (Signed)
Activity and driving progression as discussed. Continue to increase daily ambulation.

## 2018-03-27 NOTE — Telephone Encounter (Signed)
S/w patient's daughter, ok per DPR. She said patient can still come at 3:30pm today. Ignacia Bayley, NP agreed that ok to keep that appointment time. I apologized for any inconvenience and she was very understanding.

## 2018-03-27 NOTE — Telephone Encounter (Signed)
Received an incoming message page regarding the pt's PT and INR which were drawn after his office visit today. It appears that his in office INR was checked and was elevated at greater than 8.0. Pt was instructed to stop his Coumadin at his appointment today. OP lab called with critical value of PT=7.53 and PT=63.4. He will follow at the Coumadin clinic again on Wednesday, 03/29/18. Further Coumdin instruction to be given at his follow up appointment.   Kathyrn Drown NP-C [place] Pager: [phone]

## 2018-03-27 NOTE — Progress Notes (Signed)
[address]       [place] [address]             [phone]      Rodney Wiley L[facility] Medical Record #[id number] Date of Birth: [birth year]  Referring: Nelva Bush, MD Primary Care: Remi Haggard, FNP Primary Cardiologist: Nelva Bush, MD   Chief Complaint:   POST OP FOLLOW UP  Date of Procedure:                02/22/2018  Preoperative Diagnosis:       ? Aortic Root Aneurysm ? Ascending Thoracic Aortic Aneurysm ? Moderate-Severe Aortic Insufficiency ? Severe 3-vessel Coronary Artery Disease  Postoperative Diagnosis:    Same  Procedure:        Biological Bentall Aortic Root Replacement             Edwards Inspiris Resilia Stented Bovine Pericardial Tissue Valve (size 21 mm, model # 11500A, serial # [id number])             Vascutek Gelweave Valsalva synthetic root graft (size 24 mm, ref # [id number], serial #[id number])   Repair Ascending Thoracic Aortic Aneurysm             Right axillary artery cannulation             Straight graft replacement of ascending thoracic aorta   Coronary Artery Bypass Grafting x 3             Left Internal Mammary Artery to Distal Left Anterior Descending Coronary Artery             Saphenous Vein Graft to Posterior Descending Coronary Artery             Saphenous Vein Graft to Second Obtuse Marginal Branch of Left Circumflex Coronary Artery             Endoscopic Vein Harvest from Right Thigh and Lower Leg  Surgeon:        Valentina Gu. Roxy Manns, MD  Assistant:       Gaye Pollack, MD and John Giovanni, PA-C  Anesthesia:    Roberts Gaudy, MD   History of Present Illness:    Patient is a 82 year old male status post the above described procedure seen in the office on today's date and routine postsurgical follow-up.  He is scheduled to see cardiology later this afternoon.  Following discharge the patient was sent to a skilled nursing facility but currently he is now at home.  He does have a mild  cough which is intermittent in nature.  He can go for many hours throughout the day without coughing at all but then having spells of coughing that last up to an hour.  It is nonproductive.  He is not having any associated shortness of breath.  He denies fevers, chills or other constitutional symptoms.  He is not requiring any pain medication at this time.  He is ambulating well and endurance is improving.  He recently discontinued Demadex and Cardura guided by his medical doctors instructions as he was having some hypotension with blood pressures into the 80s.  He feels much better now and systolic is now greater than 100.  Overall he is pleased with his progress.  He is on Coumadin.  An INR on 03/22/2012 was 3.9.       Past Medical History:  Diagnosis Date  . Aortic insufficiency    a. 02/2018 s/p bioprosthetic AVR.  [name] Kitchen  Arthropathy   . Ascending aortic aneurysm ([place])    a. 12/2016 MRA: 4.3cm @ sinus of valsalva, 4.9cm above sinotubular jxn; b. 02/2018 s/p biological Bentall and AVR.  [name] Kitchen BPH (benign prostatic hyperplasia)   . 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 02/2018 Cath: LAD 20ost, 40p, 41m, LCX 60p, OM2 60, OM3 85, RCA 10p/m w/ L->R and R->R collats;  b. 02/2018 CABG x 3: LIMA to LAD, SVG to OM2, SVG to PDA, EVH via right thigh.  . CKD (chronic kidney disease), stage III ([place])   . Diverticulitis   . Essential hypertension   . GERD (gastroesophageal reflux disease)   . Hemorrhoids   . History of chicken pox   . History of Helicobacter pylori infection 06/2007  . Hyperlipidemia    unspecified  . Hypertension   . Left renal artery stenosis ([facility])    a. 01/2017 RA u/s: moderate L RAS.  [name] Kitchen Lower extremity edema   . Nonrheumatic mitral (valve) insufficiency    a. 12/2016 Echo: mild to mod MR; b. 09/2017 TEE: mild to mod MR.  [name] Kitchen Nonrheumatic pulmonary valve insufficiency   . 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 s/p 21 mm  Edwards Inspiris Resilia stented bovine pericardial tissue valve and 24 mm Gelweave Valsalva synthetic root conduit with reimplantation of left main coronary artery.     Social History   Tobacco Use  Smoking Status Never Smoker  Smokeless Tobacco Never Used    Social History   Substance and Sexual Activity  Alcohol Use No     No Known Allergies  Current Outpatient Medications  Medication Sig Dispense Refill  . amiodarone (PACERONE) 200 MG tablet Take 1 tablet (200 mg total) by mouth 2 (two) times daily after a meal. 60 tablet 1  . atorvastatin (LIPITOR) 20 MG tablet Take 1 tablet (20 mg total) by mouth daily. 90 tablet 2  . Cholecalciferol (VITAMIN D3) 2000 units capsule Take 2,000 Units by mouth daily.     [name] Kitchen doxazosin (CARDURA) 2 MG tablet Take 2 mg by mouth daily.    . ferrous sulfate 325 (65 FE) MG EC tablet Take 325 mg by mouth 2 (two) times daily.     . fluticasone-salmeterol (ADVAIR HFA) 115-21 MCG/ACT inhaler Inhale 2 puffs into the lungs 2 (two) times daily. Use with spacer/ Aerochamber    . folic acid-pyridoxine-cyancobalamin (FOLTX) 2.5-25-2 MG TABS tablet Take 1 tablet by mouth daily. 30 each   . Nutritional Supplements (ENSURE ENLIVE PO) Take 1 Bottle by mouth 2 (two) times daily between meals.    . potassium chloride (K-DUR,KLOR-CON) 10 MEQ tablet Take 10 mEq by mouth daily.    . sennosides-docusate sodium (SENOKOT-S) 8.6-50 MG tablet Take 1 tablet by mouth 2 (two) times daily.     . vitamin C (ASCORBIC ACID) 500 MG tablet Take 500 mg by mouth 2 (two) times daily.    [name] Kitchen warfarin (COUMADIN) 5 MG tablet Take 5 mg by mouth daily.    . carvedilol (COREG) 3.125 MG tablet Take 3.125 mg by mouth 2 (two) times daily with a meal.    . dextromethorphan-guaiFENesin (MUCINEX DM) 30-600 MG 12hr tablet Take 1 tablet by mouth 2 (two) times daily. (Patient not taking: Reported on 03/24/2018)    . torsemide (DEMADEX) 20 MG tablet Take 20 mg by mouth daily.     No current  facility-administered medications for this visit.  Physical Exam: BP 103/75 (BP Location: Left Arm, Patient Position: Sitting, Cuff Size: Normal)   Pulse 80   Resp 16   Ht 5\' 4"  (1.626 m)   Wt 72.6 kg (160 lb)   SpO2 98% Comment: ON RA  BMI 27.46 kg/m   General appearance: alert, cooperative and no distress Heart: regular rate and rhythm and Soft systolic murmur Lungs: clear to auscultation bilaterally Abdomen: benign Extremities: No edema Wound: Incisions all well-healed without evidence of infection.   Diagnostic Studies & Laboratory data:     Recent Radiology Findings:   Dg Chest 2 View  Result Date: 03/27/2018 CLINICAL DATA:  Status post aortic valve repair and thoracic aortic aneurysm repair on 02/22/2018. Status post coronary artery bypass grafts on 02/22/2018. EXAM: CHEST - 2 VIEW COMPARISON:  Chest x-rays dated 03/01/2018 and 02/20/2018 FINDINGS: Almost complete resolution of the pleural effusions since the prior study with only minimal residual blunting of the posterior costophrenic angles. Heart size and vascularity are normal. Lungs are clear. No pneumothorax. Slight elevation of the right hemidiaphragm which is new since 02/20/2018. Aortic atherosclerosis.  No acute bone abnormality. IMPRESSION: 1. Tiny residual bilateral pleural effusions. 2. New slight elevation of the right hemidiaphragm. Electronically Signed   By: Lorriane Shire M.D.   On: 03/27/2018 13:45      Recent Lab Findings: Lab Results  Component Value Date   WBC 7.7 03/24/2018   HGB 11.4 (L) 03/24/2018   HCT 34.1 (L) 03/24/2018   PLT 134 (L) 03/24/2018   GLUCOSE 171 (H) 03/24/2018   CHOL 106 03/11/2017   TRIG 194 (H) 03/11/2017   HDL 28 (L) 03/11/2017   LDLCALC 39 03/11/2017   ALT 28 03/24/2018   AST 27 03/24/2018   NA 132 (L) 03/24/2018   K 3.6 03/24/2018   CL 95 (L) 03/24/2018   CREATININE 2.44 (H) 03/24/2018   BUN 46 (H) 03/24/2018   CO2 27 03/24/2018   TSH 3.262 03/24/2018    INR 3.9 03/22/2018   HGBA1C 5.2 02/20/2018      Assessment / Plan: The patient is doing quite well.  We will not make any medication changes at this time as he is scheduled to see cardiology this afternoon.  I do agree that Cardura and Demadex not appear to be required now and his blood pressure and overall general well-being seem to be improved since stopping them.  We discussed activity progression including ambulation and lifting.  We discussed driving progression and as he is on no current pain medications and overall doing well I encouraged him to start with short trips in the daylight hours.  Of note the patient is also seeing hematology who are working him up for chronic thrombocytopenia.  We will see him again in 2 months in routine follow-up and prior to that for any surgically related issues at their request.        John Giovanni, PA-C 03/27/2018 2:03 PM

## 2018-03-27 NOTE — Progress Notes (Signed)
Hematology/Oncology Consult note [facility] Telephone:([phone] [phone]  Patient Care Team: Remi Haggard, FNP as PCP - General (Family Medicine) End, Harrell Gave, MD as PCP - Cardiology (Cardiology)   Name of the patient: Rodney Wiley  [id number]  07-28-1936    Reason for referral- thrombocytopenia   Referring physician- Threasa Alpha NP  Date of visit: 03/27/18   History of presenting illness-patient is a 82 year old male with a past medical history significant for hypertension, hyperlipidemia, stage III CKD and history of aortic insufficiency as well as ascending aortic aneurysm.  He recently underwent bioprosthetic aortic valve replacement, CABG x3 and resuspension of the left atrium on 02/22/2018.  He has been referred to [place] for evaluation of thrombocytopenia.  He is recovering slowly from his recent procedure.  He reports fatigue and some good days and some not so good days when he is more fatigued.  He denies any chest pain or exertional shortness of breath.  Denies any bleeding or bruising.  He follows up with nephrology for his chronic kidney disease.  ECOG PS- 1  Pain scale- 0   Review of systems- Review of Systems  Constitutional: Positive for malaise/fatigue. Negative for chills, fever and weight loss.  HENT: Negative for congestion, ear discharge and nosebleeds.   Eyes: Negative for blurred vision.  Respiratory: Negative for cough, hemoptysis, sputum production, shortness of breath and wheezing.   Cardiovascular: Negative for chest pain, palpitations, orthopnea and claudication.  Gastrointestinal: Negative for abdominal pain, blood in stool, constipation, diarrhea, heartburn, melena, nausea and vomiting.  Genitourinary: Negative for dysuria, flank pain, frequency, hematuria and urgency.  Musculoskeletal: Negative for back pain, joint pain and myalgias.  Skin: Negative for rash.  Neurological: Negative for dizziness,  tingling, focal weakness, seizures, weakness and headaches.  Endo/Heme/Allergies: Does not bruise/bleed easily.  Psychiatric/Behavioral: Negative for depression and suicidal ideas. The patient does not have insomnia.     No Known Allergies  Patient Active Problem List   Diagnosis Date Noted  . 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 S/P CABG x 3 02/22/2018  . S/P ascending aortic aneurysm repair 02/22/2018  . Coronary artery disease involving native heart without angina pectoris   . 1st degree AV block 12/21/2017  . Stage 3 chronic kidney disease ([place]) 03/22/2017  . Thoracic aortic aneurysm ([place]) 01/27/2017  . Renal artery stenosis ([place]) 01/27/2017  . Hyperlipidemia LDL goal <70 01/27/2017  . Aortic regurgitation 10/27/2016  . Mitral regurgitation 10/27/2016  . Essential hypertension 02/08/2007  . HYPERTENSION, BENIGN SYSTEMIC 02/02/2007     Past Medical History:  Diagnosis Date  . Aortic insufficiency    a. 02/2018 s/p bioprosthetic AVR.  [name] Kitchen Arthropathy   . Ascending aortic aneurysm ([place])    a. 12/2016 MRA: 4.3cm @ sinus of valsalva, 4.9cm above sinotubular jxn; b. 02/2018 s/p biological Bentall and AVR.  [name] Kitchen BPH (benign prostatic hyperplasia)   . 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 02/2018 Cath: LAD 20ost, 40p, 39m, LCX 60p, OM2 60, OM3 85, RCA 10p/m w/ L->R and R->R collats;  b. 02/2018 CABG x 3: LIMA to LAD, SVG to OM2, SVG to PDA, EVH via right thigh.  . CKD (chronic kidney disease), stage III ([place])   . Diverticulitis   . Essential hypertension   . GERD (gastroesophageal reflux disease)   . Hemorrhoids   . History of chicken pox   . History of Helicobacter pylori  infection 06/2007  . Hyperlipidemia    unspecified  . Hypertension   . Left renal artery stenosis ([facility])    a. 01/2017 RA u/s: moderate L RAS.  [name] Kitchen Lower extremity edema   . Nonrheumatic mitral (valve) insufficiency    a. 12/2016 Echo:  mild to mod MR; b. 09/2017 TEE: mild to mod MR.  [name] Kitchen Nonrheumatic pulmonary valve insufficiency   . 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 s/p 21 mm Edwards Inspiris Resilia stented bovine pericardial tissue valve and 24 mm Gelweave Valsalva synthetic root conduit with reimplantation of left main coronary artery.     Past Surgical History:  Procedure Laterality Date  . AORTIC VALVE REPAIR N/A 02/22/2018   Procedure: AORTIC VALVE REPLACEMENT -Biological Bentall aortic root replacement,;  Surgeon: Rexene Alberts, MD;  Location: [place];  Service: Open Heart Surgery;  Laterality: N/A;  . bypass surgery    . CARDIAC CATHETERIZATION     01/16/2018  . COLONOSCOPY  2011   by Dr. Candace Cruise with findings of diverticulosis  . CORONARY ARTERY BYPASS GRAFT N/A 02/22/2018   Procedure: CORONARY ARTERY BYPASS GRAFTING (CABG) x three, using left internal mammary artery and right leg greater saphenous vein harvested endoscopically;  Surgeon: Rexene Alberts, MD;  Location: [place];  Service: Open Heart Surgery;  Laterality: N/A;  . CORONARY/GRAFT ANGIOGRAPHY N/A 01/16/2018   Procedure: CORONARY/GRAFT ANGIOGRAPHY;  Surgeon: Nelva Bush, MD;  Location: [place] [facility];  Service: Cardiovascular;  Laterality: N/A;  . ELBOW SURGERY Left   . hemorhoidectomy    . RIGHT HEART CATH N/A 01/16/2018   Procedure: RIGHT HEART CATH;  Surgeon: Nelva Bush, MD;  Location: [place] [facility];  Service: Cardiovascular;  Laterality: N/A;  . SHOULDER ARTHROSCOPY WITH ROTATOR CUFF REPAIR Right   . SHOULDER SURGERY Right   . TEE WITHOUT CARDIOVERSION N/A 09/27/2017   Procedure: TRANSESOPHAGEAL ECHOCARDIOGRAM (TEE);  Surgeon: Dorothy Spark, MD;  Location: [facility] ENDOSCOPY;  Service: Cardiovascular;  Laterality: N/A;  . TEE WITHOUT CARDIOVERSION N/A 02/22/2018   Procedure: TRANSESOPHAGEAL ECHOCARDIOGRAM (TEE);  Surgeon: Rexene Alberts, MD;  Location: [place];  Service: Open Heart Surgery;  Laterality: N/A;  . THORACIC AORTIC ANEURYSM REPAIR N/A 02/22/2018   Procedure: THORACIC ASCENDING ANEURYSM REPAIR (AAA) Resection of ascending aorta aneurysm;  Surgeon: Rexene Alberts, MD;  Location: [facility] OR;  Service: Open Heart Surgery;  Laterality: N/A;    Social History   Socioeconomic History  . Marital status: Unknown    Spouse name: Not on file  . Number of children: Not on file  . Years of education: Not on file  . Highest education level: Not on file  Occupational History  . Not on file  Social Needs  . Financial resource strain: Not on file  . Food insecurity:    Worry: Not on file    Inability: Not on file  . Transportation needs:    Medical: Not on file    Non-medical: Not on file  Tobacco Use  . Smoking status: Never Smoker  . Smokeless tobacco: Never Used  Substance and Sexual Activity  . Alcohol use: No  . Drug use: No  . Sexual activity: Not on file  Lifestyle  . Physical activity:    Days per week: Not on file    Minutes per session: Not on file  . Stress: Not on file  Relationships  . Social connections:    Talks  on phone: Not on file    Gets together: Not on file    Attends religious service: Not on file    Active member of club or organization: Not on file    Attends meetings of clubs or organizations: Not on file    Relationship status: Not on file  . Intimate partner violence:    Fear of current or ex partner: Not on file    Emotionally abused: Not on file    Physically abused: Not on file    Forced sexual activity: Not on file  Other Topics Concern  . Not on file  Social History Narrative  . Not on file     Family History  Problem Relation Age of Onset  . Hypertension Mother   . Stroke Mother   . Leukemia Father   . Heart attack Paternal Grandmother   . Stroke Paternal Grandfather   . Heart Problems Son 34  . Heart Problems Son 27  . Uterine cancer Sister   . Prostate cancer Brother   . Colon  cancer Brother   . Stroke Sister      Current Outpatient Medications:  .  amiodarone (PACERONE) 200 MG tablet, Take 1 tablet (200 mg total) by mouth 2 (two) times daily after a meal., Disp: 60 tablet, Rfl: 1 .  atorvastatin (LIPITOR) 20 MG tablet, Take 1 tablet (20 mg total) by mouth daily., Disp: 90 tablet, Rfl: 2 .  carvedilol (COREG) 3.125 MG tablet, Take 3.125 mg by mouth 2 (two) times daily with a meal., Disp: , Rfl:  .  Cholecalciferol (VITAMIN D3) 2000 units capsule, Take 2,000 Units by mouth daily. , Disp: , Rfl:  .  doxazosin (CARDURA) 2 MG tablet, Take 2 mg by mouth daily., Disp: , Rfl:  .  ferrous sulfate 325 (65 FE) MG EC tablet, Take 325 mg by mouth 2 (two) times daily. , Disp: , Rfl:  .  fluticasone-salmeterol (ADVAIR HFA) 115-21 MCG/ACT inhaler, Inhale 2 puffs into the lungs 2 (two) times daily. Use with spacer/ Aerochamber, Disp: , Rfl:  .  Fluticasone-Salmeterol (ADVAIR) 250-50 MCG/DOSE AEPB, Inhale 1 puff into the lungs 2 (two) times daily., Disp: , Rfl:  .  folic acid-pyridoxine-cyancobalamin (FOLTX) 2.5-25-2 MG TABS tablet, Take 1 tablet by mouth daily., Disp: 30 each, Rfl:  .  Nutritional Supplements (ENSURE ENLIVE PO), Take 1 Bottle by mouth 2 (two) times daily between meals., Disp: , Rfl:  .  potassium chloride (K-DUR,KLOR-CON) 10 MEQ tablet, Take 10 mEq by mouth daily., Disp: , Rfl:  .  sennosides-docusate sodium (SENOKOT-S) 8.6-50 MG tablet, Take 1 tablet by mouth 2 (two) times daily. , Disp: , Rfl:  .  torsemide (DEMADEX) 20 MG tablet, Take 20 mg by mouth daily., Disp: , Rfl:  .  vitamin C (ASCORBIC ACID) 500 MG tablet, Take 500 mg by mouth 2 (two) times daily., Disp: , Rfl:  .  warfarin (COUMADIN) 5 MG tablet, Take 5 mg by mouth daily., Disp: , Rfl:  .  Amino Acids-Protein Hydrolys (FEEDING SUPPLEMENT, PRO-STAT SUGAR FREE 64,) LIQD, Take 30 mLs by mouth 2 (two) times daily between meals. low protein, low albumin, Disp: , Rfl:  .  dextromethorphan-guaiFENesin (MUCINEX  DM) 30-600 MG 12hr tablet, Take 1 tablet by mouth 2 (two) times daily. (Patient not taking: Reported on 03/24/2018), Disp: , Rfl:  .  levalbuterol (XOPENEX) 1.25 MG/3ML nebulizer solution, Take 1.25 mg by nebulization every 4 (four) hours as needed., Disp: , Rfl:  .  oxyCODONE (  OXY IR/ROXICODONE) 5 MG immediate release tablet, Take 1-2 tablets (5-10 mg total) by mouth every 6 (six) hours as needed for severe pain., Disp: 30 tablet, Rfl: 0   Physical exam:  Vitals:   03/24/18 1003  BP: 107/77  Pulse: 89  Resp: 18  Temp: 97.9 F (36.6 C)  TempSrc: Tympanic  SpO2: 96%  Weight: 159 lb 4.8 oz (72.3 kg)  Height: 5\' 2"  (1.575 m)   Physical Exam  Constitutional: He is oriented to person, place, and time.  Elderly gentleman who appears fatigued.  No acute distress  HENT:  Head: Normocephalic and atraumatic.  Eyes: Pupils are equal, round, and reactive to light. EOM are normal.  Neck: Normal range of motion.  Cardiovascular: Normal rate and regular rhythm.  Systolic heart murmur  Pulmonary/Chest: Effort normal and breath sounds normal.  Abdominal: Soft. Bowel sounds are normal.  Neurological: He is alert and oriented to person, place, and time.  Skin: Skin is warm and dry.       CMP Latest Ref Rng & Units 03/24/2018  Glucose 65 - 99 mg/dL 171(H)  BUN 6 - 20 mg/dL 46(H)  Creatinine 0.61 - 1.24 mg/dL 2.44(H)  Sodium 135 - 145 mmol/L 132(L)  Potassium 3.5 - 5.1 mmol/L 3.6  Chloride 101 - 111 mmol/L 95(L)  CO2 22 - 32 mmol/L 27  Calcium 8.9 - 10.3 mg/dL 8.4(L)  Total Protein 6.5 - 8.1 g/dL 6.8  Total Bilirubin 0.3 - 1.2 mg/dL 1.2  Alkaline Phos 38 - 126 U/L 69  AST 15 - 41 U/L 27  ALT 17 - 63 U/L 28   CBC Latest Ref Rng & Units 03/24/2018  WBC 3.8 - 10.6 K/uL 7.7  Hemoglobin 13.0 - 18.0 g/dL 11.4(L)  Hematocrit 40.0 - 52.0 % 34.1(L)  Platelets 150 - 440 K/uL 134(L)    No images are attached to the encounter.  Dg Chest 2 View  Result Date: 03/01/2018 CLINICAL DATA:  Recent  aortic valve replacement.  Pleural effusion. EXAM: CHEST - 2 VIEW COMPARISON:  February 25, 2018 FINDINGS: There is a small pleural effusion on the left. There is patchy consolidation in the medial lung bases. Lungs elsewhere clear. No pneumothorax. Heart is borderline enlarged with pulmonary vascularity within normal limits. Patient is status post coronary artery bypass grafting and aortic valve replacement. No adenopathy. No bone lesions. IMPRESSION: Medial bibasilar consolidation with small left pleural effusion. Lungs elsewhere clear. No pneumothorax. Stable cardiac prominence with areas of postoperative change. Electronically Signed   By: Lowella Grip III M.D.   On: 03/01/2018 09:50    Assessment and plan- Patient is a 82 y.o. male referred for thrombocytopenia.  Patient has had mild thrombocytopenia with a platelet count between 120-140 since 01/2017.  His baseline hemoglobin is between 11-12 but more recently following his cardiac procedure his hemoglobin has been between 9-10.  With regards to thrombocytopenia: I will repeat a CBC with differential, smear review as well as B12 and folate.   Normocytic anemia: possibly secondary to anemia of chronic disease and chronic kidney disease.  Today I will check CBC with differential, ferritin and iron studies, B12, folate, TSH, reticulocyte count, myeloma panel  I will see him back in 2 weeks time to discuss results of blood work and further management   Thank you for this kind referral and the opportunity to participate in the care of this patient   Visit Diagnosis 1. Anemia of chronic disease   2. Thrombocytopenia ([place])  Dr. Randa Evens, MD, MPH [facility] [phone] 03/27/2018  1:50 PM

## 2018-03-27 NOTE — Progress Notes (Signed)
Office Visit    Patient Name: Rodney Wiley Date of Encounter: 03/27/2018  Primary Care Provider:  Remi Haggard, FNP Primary Cardiologist:  Rodney Bush, MD  Chief Complaint    82 year old male with a history of aortic insufficiency, ascending aortic aneurysm, stage III chronic kidney disease, hypertension, hyperlipidemia, and left renal artery stenosis status post biological Bentall aortic root replacement with bioprosthetic aortic valve replacement, resuspension of the left main, and CABG x3, who presents for follow-up.  Past Medical History    Past Medical History:  Diagnosis Date  . Aortic insufficiency    a. 02/2018 s/p bioprosthetic AVR.  [name] Kitchen Arthropathy   . Ascending aortic aneurysm ([place])    a. 12/2016 MRA: 4.3cm @ sinus of valsalva, 4.9cm above sinotubular jxn; b. 02/2018 s/p biological Bentall and AVR.  [name] Kitchen BPH (benign prostatic hyperplasia)   . 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 02/2018 Cath: LAD 20ost, 40p, 36m, LCX 60p, OM2 60, OM3 85, RCA 10p/m w/ L->R and R->R collats;  b. 02/2018 CABG x 3: LIMA to LAD, SVG to OM2, SVG to PDA, EVH via right thigh.  . CKD (chronic kidney disease), stage III ([place])   . Diverticulitis   . Essential hypertension   . GERD (gastroesophageal reflux disease)   . Hemorrhoids   . History of chicken pox   . History of Helicobacter pylori infection 06/2007  . Hyperlipidemia    unspecified  . Hypertension   . Left renal artery stenosis ([facility])    a. 01/2017 RA u/s: moderate L RAS.  [name] Kitchen Lower extremity edema   . Nonrheumatic mitral (valve) insufficiency    a. 12/2016 Echo: mild to mod MR; b. 09/2017 TEE: mild to mod MR.  [name] Kitchen Nonrheumatic pulmonary valve insufficiency   . 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 s/p 21 mm Edwards Inspiris Resilia stented bovine pericardial tissue valve and 24 mm Gelweave Valsalva synthetic root conduit with reimplantation of left main  coronary artery.   Past Surgical History:  Procedure Laterality Date  . AORTIC VALVE REPAIR N/A 02/22/2018   Procedure: AORTIC VALVE REPLACEMENT -Biological Bentall aortic root replacement,;  Surgeon: Rodney Alberts, MD;  Location: [facility];  Service: Open Heart Surgery;  Laterality: N/A;  . bypass surgery    . CARDIAC CATHETERIZATION     01/16/2018  . COLONOSCOPY  2011   by Dr. Candace Wiley with findings of diverticulosis  . CORONARY ARTERY BYPASS GRAFT N/A 02/22/2018   Procedure: CORONARY ARTERY BYPASS GRAFTING (CABG) x three, using left internal mammary artery and right leg greater saphenous vein harvested endoscopically;  Surgeon: Rodney Alberts, MD;  Location: [facility];  Service: Open Heart Surgery;  Laterality: N/A;  . CORONARY/GRAFT ANGIOGRAPHY N/A 01/16/2018   Procedure: CORONARY/GRAFT ANGIOGRAPHY;  Surgeon: Rodney Bush, MD;  Location: [facility];  Service: Cardiovascular;  Laterality: N/A;  . ELBOW SURGERY Left   . hemorhoidectomy    . RIGHT HEART CATH N/A 01/16/2018   Procedure: RIGHT HEART CATH;  Surgeon: Rodney Bush, MD;  Location: [facility];  Service: Cardiovascular;  Laterality: N/A;  . SHOULDER ARTHROSCOPY WITH ROTATOR CUFF REPAIR Right   . SHOULDER SURGERY Right   . TEE WITHOUT CARDIOVERSION N/A 09/27/2017   Procedure: TRANSESOPHAGEAL ECHOCARDIOGRAM (TEE);  Surgeon: Rodney Spark, MD;  Location: [facility] ENDOSCOPY;  Service: Cardiovascular;  Laterality: N/A;  . TEE WITHOUT CARDIOVERSION N/A 02/22/2018   Procedure: TRANSESOPHAGEAL  ECHOCARDIOGRAM (TEE);  Surgeon: Rodney Alberts, MD;  Location: [place];  Service: Open Heart Surgery;  Laterality: N/A;  . THORACIC AORTIC ANEURYSM REPAIR N/A 02/22/2018   Procedure: THORACIC ASCENDING ANEURYSM REPAIR (AAA) Resection of ascending aorta aneurysm;  Surgeon: Rodney Alberts, MD;  Location: [facility] OR;  Service: Open Heart Surgery;  Laterality: N/A;    Allergies  No Known Allergies  History of Present Illness    82 year old  male with the above complex past medical history including aortic insufficiency, ascending aortic aneurysm, stage III chronic kidney disease, hypertension, hyperlipidemia, and left renal artery stenosis.  He recently underwent diagnostic catheterization which revealed multivessel coronary artery disease and in the setting of aortic valve and aortic disease, on March 20, he underwent biological Bentall with aortic root replacement, bioprosthetic aortic valve, CABG x3, and reimplantation of the left main coronary artery.  Postop course was complicated by anemia requiring transfusion along with paroxysmal atrial fibrillation, and mild worsening of renal failure.  He was discharged to rehab and had relatively stable renal function and blood counts.    I saw [name] in clinic on April 15, at which time he was doing well.  His INR was elevated over 7 and he had received vitamin K earlier that morning from the rehab facility.  He has since followed up with our Coumadin clinic with an INR of 3.9 on April 17.  His Coumadin dose was reduced and resumed.  On April 19, he was feeling fatigued.  He went to a hematology appointment and while there became diaphoretic and very hot.  His blood pressure was in the low 100s.  He later developed nausea and vomiting.  Blood pressure dropped to the high 80s while at home.  His daughter was with him.  They called our office and we recommended holding carvedilol and torsemide.  He says that over the weekend, he felt significantly better-perhaps the best he is felt since surgery.  He has had no recurrence of nausea, vomiting, lightheadedness, or diaphoresis.  His blood pressure was 105 this morning.  He has not taken carvedilol or torsemide since Friday.  His weight is up slightly to 161, which is where he was at when he was here on the 15th.  He denies any chest pain, dyspnea, PND, orthopnea, dizziness, syncope, edema, or early satiety.  He was seen by thoracic surgery this morning  and felt to be doing well.  Home Medications    Prior to Admission medications   Medication Sig Start Date End Date Taking? Authorizing Provider  amiodarone (PACERONE) 200 MG tablet Take 1 tablet (200 mg total) by mouth 2 (two) times daily after a meal. 03/03/18  Yes Gold, Wayne E, PA-C  atorvastatin (LIPITOR) 20 MG tablet Take 1 tablet (20 mg total) by mouth daily. 03/09/18  Yes End, Harrell Gave, MD  Cholecalciferol (VITAMIN D3) 2000 units capsule Take 2,000 Units by mouth daily.    Yes [provider]  dextromethorphan-guaiFENesin (MUCINEX DM) 30-600 MG 12hr tablet Take 1 tablet by mouth 2 (two) times daily. 03/03/18  Yes Gold, Wayne E, PA-C  doxazosin (CARDURA) 2 MG tablet Take 2 mg by mouth daily.   Yes [provider]  ferrous sulfate 325 (65 FE) MG EC tablet Take 325 mg by mouth 2 (two) times daily.    Yes [provider]  fluticasone-salmeterol (ADVAIR HFA) 115-21 MCG/ACT inhaler Inhale 2 puffs into the lungs 2 (two) times daily. Use with spacer/ Aerochamber   Yes [provider]  folic acid-pyridoxine-cyancobalamin (FOLTX) 2.5-25-2 MG TABS tablet Take 1 tablet by mouth daily. 03/04/18  Yes Gold, Wayne E, PA-C  Nutritional Supplements (ENSURE ENLIVE PO) Take 1 Bottle by mouth 2 (two) times daily between meals.   Yes [provider]  potassium chloride (K-DUR,KLOR-CON) 10 MEQ tablet Take 10 mEq by mouth daily.   Yes [provider]  sennosides-docusate sodium (SENOKOT-S) 8.6-50 MG tablet Take 1 tablet by mouth 2 (two) times daily.    Yes [provider]  vitamin C (ASCORBIC ACID) 500 MG tablet Take 500 mg by mouth 2 (two) times daily.   Yes [provider]  warfarin (COUMADIN) 5 MG tablet Take 5 mg by mouth daily.   Yes [provider]  carvedilol (COREG) 3.125 MG tablet Take 3.125 mg by mouth 2 (two) times daily with a meal.    [provider]  torsemide (DEMADEX) 20 MG tablet Take 20 mg by mouth daily.     [provider]    Review of Systems    Felt hot, diaphoretic, nauseated, and vomited on 4/19.  He has since felt well without chest pain, dyspnea, PND, orthopnea, dizziness, syncope, edema, or early satiety.  All other systems reviewed and are otherwise negative except as noted above.  Physical Exam    VS:  BP 120/68 (BP Location: Left Arm, Patient Position: Sitting, Cuff Size: Normal)   Pulse 93   Ht 5\' 7"  (1.702 m)   Wt 161 lb 12 oz (73.4 kg)   BMI 25.33 kg/m  , BMI Body mass index is 25.33 kg/m. GEN: Well nourished, well developed, in no acute distress.  HEENT: normal.  Neck: Supple, no JVD, carotid bruits, or masses. Cardiac: RRR, 2/6 systolic ejection murmur at the upper sternal borders, no rubs, or gallops. No clubbing, cyanosis.  Trace bimalleolar edema.  Radials/DP/PT 2+ and equal bilaterally.  Respiratory:  Respirations regular and unlabored, clear to auscultation bilaterally. GI: Soft, nontender, nondistended, BS + x 4. MS: no deformity or atrophy. Skin: warm and dry, no rash. Neuro:  Strength and sensation are intact. Psych: Normal affect.  Accessory Clinical Findings    ECG -regular sinus rhythm, 93, first-degree AV block, left axis, prolonged QT.  Assessment & Plan    1.  Moderate to severe aortic insufficiency: Status post bioprosthetic aortic valve replacement with Bentall procedure and CABG x3.  I saw him last week and he was doing well at that time.  He had an episode of diaphoresis, nausea, and vomiting on April 19 which has since resolved.  He had an INR checked today and was elevated at greater than 8.  In that setting, we will send him to the lab for a formal INR.  He will hold his Coumadin and follow-up in Coumadin clinic again on Wednesday.  He is scheduled for a follow-up echo to establish new baseline.  2.  Thoracic aortic aneurysm: Status post biological Bentall.  3.  Nausea, vomiting, diaphoresis/relative hypotension: This occurred on April 19  in the setting of relative hypotension, and resolved by the next day.  He has had no recurrence and has been feeling well.  We have been holding his torsemide and carvedilol.  Blood pressure was 105 at home this morning.  He is 120/68 currently.  I will continue to hold beta-blocker and torsemide.  Follow-up lab work today.  4.  Postoperative atrial fibrillation: He has been maintaining sinus rhythm on amiodarone.  He will drop amiodarone to 200 mg daily.  We discussed possibly discontinuing it as is been in months since his surgery however since we are currently holding beta-blocker, I will continue amiodarone for now.  Hopefully will be able to resume beta-blocker at next follow-up and discontinue amiodarone.  Of note, QTC is listed as prolonged on ECG.  It is notable that he has a first-degree AV block and his P wave back into his T wave.  I think the ECG machine is miscalculating his QT.  5.  Essential hypertension: Stable and now off of beta-blocker.  6.  Hyperlipidemia: LDL was 39 on March 11, 2017.  Continue statin.  7.  Coronary artery disease: Status post CABG at the time of Bentall.  No chest pain or dyspnea.  He is currently not on aspirin in the setting of short course of Coumadin.  We will plan to initiate aspirin 81 mg after discontinuation of Coumadin.  Continue statin.  8.  Stage III-IV chronic kidney disease: Creatinine was up slightly on Friday-2.44.  Torsemide therapy has been on hold over the weekend.  I will follow-up today.  9.  Normocytic anemia: In the setting of elevated INR with nausea and vomiting last week, I will follow-up CBC today.  This has been stable.  He is following up with hematology.  10.  Coagulopathy: INR elevated.  Stat follow-up.  11.  Diastolic congestive heart failure: Torsemide on hold.  Heart rate and blood pressure stable.  He has trace bimalleolar edema.  His daughter is with him today.  We discussed that he may take half of the torsemide for weight gain  of 3 pounds in 24 hours or 5 pounds over the course of the week.  Following up creatinine today.  12.  Disposition: Lab work today.  Follow-up as planned in approximately 6 weeks or sooner if necessary.  He is already scheduled for repeat echo to establish new baseline following surgery.  Murray Hodgkins, NP 03/27/2018, 4:28 PM

## 2018-03-27 NOTE — Patient Instructions (Signed)
Medication Instructions:  Your physician has recommended you make the following change in your medication:  CONTINUE to remain off torsemide and carvedilol. If you have weight gain greater than 3 lbs, you may take 1/2 tablet of torsemide.   Labwork: Your physician recommends that you return for lab work in: TODAY (STAT PT/INR, CBC, BMET). - Please go to the [facility]. You will check in at the front desk to the right as you walk into the atrium. Valet Parking is offered if needed.    Testing/Procedures: none  Follow-Up: Your physician recommends that you schedule a follow-up appointment in: AS SCHEDULED.   If you need a refill on your cardiac medications before your next appointment, please call your pharmacy.

## 2018-03-28 ENCOUNTER — Telehealth: Payer: Self-pay | Admitting: Internal Medicine

## 2018-03-28 LAB — MULTIPLE MYELOMA PANEL, SERUM
Albumin SerPl Elph-Mcnc: 3 g/dL (ref 2.9–4.4)
Albumin/Glob SerPl: 1 (ref 0.7–1.7)
Alpha 1: 0.2 g/dL (ref 0.0–0.4)
Alpha2 Glob SerPl Elph-Mcnc: 0.8 g/dL (ref 0.4–1.0)
B-Globulin SerPl Elph-Mcnc: 0.9 g/dL (ref 0.7–1.3)
Gamma Glob SerPl Elph-Mcnc: 1.3 g/dL (ref 0.4–1.8)
Globulin, Total: 3.1 g/dL (ref 2.2–3.9)
IgA: 179 mg/dL (ref 61–437)
IgG (Immunoglobin G), Serum: 1407 mg/dL (ref 700–1600)
IgM (Immunoglobulin M), Srm: 120 mg/dL (ref 15–143)
Total Protein ELP: 6.1 g/dL (ref 6.0–8.5)

## 2018-03-28 NOTE — Telephone Encounter (Signed)
Sherine need a verbal order for pt to do occpational therapy with frequency for 1 time a week for 3 weeks. Please advise and leave message if necessary b/c vm is confidential

## 2018-03-28 NOTE — Telephone Encounter (Signed)
Called [facility] nurse and asked her to reach out to patient's PCP, Threasa Alpha, FNP for management of occupational therapies. She verbalized understanding and will let us know if any problems.

## 2018-03-29 ENCOUNTER — Other Ambulatory Visit: Payer: Self-pay | Admitting: Nurse Practitioner

## 2018-03-29 ENCOUNTER — Telehealth: Payer: Self-pay | Admitting: *Deleted

## 2018-03-29 ENCOUNTER — Ambulatory Visit (INDEPENDENT_AMBULATORY_CARE_PROVIDER_SITE_OTHER): Payer: Medicare HMO

## 2018-03-29 DIAGNOSIS — Z953 Presence of xenogenic heart valve: Secondary | ICD-10-CM | POA: Diagnosis not present

## 2018-03-29 DIAGNOSIS — D649 Anemia, unspecified: Secondary | ICD-10-CM

## 2018-03-29 DIAGNOSIS — Z7901 Long term (current) use of anticoagulants: Secondary | ICD-10-CM | POA: Diagnosis not present

## 2018-03-29 LAB — POCT INR: INR: 5.8

## 2018-03-29 NOTE — Telephone Encounter (Signed)
-----   Message from Nelva Bush, MD sent at 03/29/2018  3:02 PM EDT ----- Regarding: RE: BP & HR Thanks for the update.  Given recent cardiac surgery as well as postoperative anemia, I am not surprised that [name]'s heart rate is a little bit higher than his baseline.  Amiodarone has some beta-blockade effects, so dropping the dose may cause his resting heart rate to be a little higher than before.  I agree with continuing his current medications.  It sounds like he was doing fairly well when he was seen by Ignacia Bayley earlier this week.  Gerald Stabs  ----- Message ----- From: Vanessa Ralphs, RN Sent: 03/29/2018   2:37 PM To: Nelva Bush, MD Subject: FW: BP & HR                                      ----- Message ----- From: Stana Bunting, RN Sent: 03/29/2018   1:21 PM To: Vanessa Ralphs, RN Subject: BP & HR                                        Rodney Wiley, Rodney Wiley came in for his INR check and his daughter asked me to check his BP while he was here.  120/71, HR 91 She is concerned that his HR is been increasing since decreasing his amiodarone, as it has climbed ~10 pts per day since Monday. Advised her of normal ranges and to call if pt is SOB or HR is irregular.  Told her that I would make you aware in the event that Dr. Saunders Revel wants to make any changes to his meds. [facility]

## 2018-03-29 NOTE — Telephone Encounter (Signed)
S/w patient's daughter. She verbalized understanding of Dr. Darnelle Bos recommendations and plan of care and was very appreciative.

## 2018-03-29 NOTE — Patient Instructions (Signed)
Please skip coumadin tonight, tomorrow and Friday, then START NEW DOSAGE of 1/2 tablet daily.  Recheck on Monday.

## 2018-03-31 DIAGNOSIS — L821 Other seborrheic keratosis: Secondary | ICD-10-CM | POA: Diagnosis not present

## 2018-03-31 DIAGNOSIS — X32XXXA Exposure to sunlight, initial encounter: Secondary | ICD-10-CM | POA: Diagnosis not present

## 2018-03-31 DIAGNOSIS — L570 Actinic keratosis: Secondary | ICD-10-CM | POA: Diagnosis not present

## 2018-04-03 ENCOUNTER — Ambulatory Visit (INDEPENDENT_AMBULATORY_CARE_PROVIDER_SITE_OTHER): Payer: Medicare HMO

## 2018-04-03 DIAGNOSIS — D649 Anemia, unspecified: Secondary | ICD-10-CM | POA: Diagnosis not present

## 2018-04-03 DIAGNOSIS — Z953 Presence of xenogenic heart valve: Secondary | ICD-10-CM | POA: Diagnosis not present

## 2018-04-03 DIAGNOSIS — Z7901 Long term (current) use of anticoagulants: Secondary | ICD-10-CM | POA: Diagnosis not present

## 2018-04-03 LAB — POCT INR: INR: 1.8

## 2018-04-03 NOTE — Progress Notes (Signed)
Pt came to office for INR check & brought BP record:  Friday 4/26: 154/102 wake up 138/87 after meds  Sat 4/27: 149/101 wake up 138/68 bedtime  Sun 4/28: 143/65 wake up 135/79 pm  Mon 4/29: 170/101 wake up  He reports that his HR has remained consistently in the 90s and BP is normal when he checks it throughout the day, but is elevated on waking.  He takes carvedilol 3.125 mg BID. Advised him that I will make Dr. Saunders Revel aware and call him back w/ his recommendation.

## 2018-04-03 NOTE — Patient Instructions (Signed)
Please take 1 whole tablet tonight, then continue of 1/2 tablet daily.  Recheck in 1 week.

## 2018-04-04 ENCOUNTER — Telehealth: Payer: Self-pay | Admitting: *Deleted

## 2018-04-04 ENCOUNTER — Other Ambulatory Visit: Payer: Self-pay | Admitting: *Deleted

## 2018-04-04 MED ORDER — CARVEDILOL 3.125 MG PO TABS: 3.1250 mg | ORAL_TABLET | Freq: Two times a day (BID) | ORAL | 3 refills | Status: DC

## 2018-04-04 MED ORDER — CARVEDILOL 3.125 MG PO TABS: 6.2500 mg | ORAL_TABLET | Freq: Two times a day (BID) | ORAL | 3 refills | Status: DC

## 2018-04-04 NOTE — Progress Notes (Signed)
Let's have Mr. Hidalgo increase his carvedilol to 6.25 mg BID.  He should continue to monitor his heart rate and blood pressure, as he has been doing.  Thanks.  Nelva Bush, MD [facility] Pager: [phone]

## 2018-04-04 NOTE — Progress Notes (Signed)
S/w patient. He verbalized understanding to take carvedilol 6.25 mg (2 tablets) by mouth two times a day and to continue monitoring BP and HR. He thinks he has enough tablets at home right now and will call back if he needs a refill. He was very [employer] attorney.

## 2018-04-04 NOTE — Telephone Encounter (Signed)
S/w Sonya. She verbalized understanding to restart carvedilol 3.125mg  by mouth two times a day. She was appreciative.

## 2018-04-04 NOTE — Telephone Encounter (Signed)
Since carvedilol was stopped, let's have him restart carvedilol 3.125 mg BID with close BP and HR follow-up at home.  Nelva Bush, MD [facility] Pager: [phone]

## 2018-04-04 NOTE — Telephone Encounter (Signed)
Patient's daughter-in-law, Davy Pique, calling.  She states patient has not been taking the carvedilol 3.125 mg BID at this time as Ignacia Bayley, NP told him to remain off it at office visit from 03/27/18.  She wants to clarify what dose Dr End wants patient to take since he has been off the carvedilol since 03/24/18. Routing to Dr End for review.

## 2018-04-04 NOTE — Progress Notes (Signed)
Spoke w/ pt.  Advised him of Dr. Darnelle Bos recommendation.  He reports that at his last ov w/ Ignacia Bayley, NP, he was advised to hold carvedilol, but he is unsure how long he was to do this.  Advised pt to resume carvedilol 3.125 mg BID and continue to monitor BP, w/ option to increase further is readings remain elevated. He verbalizes understanding and will bring BP readings to his next INR check.

## 2018-04-05 DIAGNOSIS — Z48812 Encounter for surgical aftercare following surgery on the circulatory system: Secondary | ICD-10-CM | POA: Diagnosis not present

## 2018-04-05 DIAGNOSIS — I129 Hypertensive chronic kidney disease with stage 1 through stage 4 chronic kidney disease, or unspecified chronic kidney disease: Secondary | ICD-10-CM | POA: Diagnosis not present

## 2018-04-05 DIAGNOSIS — I340 Nonrheumatic mitral (valve) insufficiency: Secondary | ICD-10-CM | POA: Diagnosis not present

## 2018-04-05 DIAGNOSIS — I712 Thoracic aortic aneurysm, without rupture: Secondary | ICD-10-CM | POA: Diagnosis not present

## 2018-04-05 DIAGNOSIS — I371 Nonrheumatic pulmonary valve insufficiency: Secondary | ICD-10-CM | POA: Diagnosis not present

## 2018-04-05 DIAGNOSIS — N400 Enlarged prostate without lower urinary tract symptoms: Secondary | ICD-10-CM | POA: Diagnosis not present

## 2018-04-05 DIAGNOSIS — I440 Atrioventricular block, first degree: Secondary | ICD-10-CM | POA: Diagnosis not present

## 2018-04-05 DIAGNOSIS — E46 Unspecified protein-calorie malnutrition: Secondary | ICD-10-CM | POA: Diagnosis not present

## 2018-04-05 DIAGNOSIS — I2510 Atherosclerotic heart disease of native coronary artery without angina pectoris: Secondary | ICD-10-CM | POA: Diagnosis not present

## 2018-04-05 DIAGNOSIS — N183 Chronic kidney disease, stage 3 (moderate): Secondary | ICD-10-CM | POA: Diagnosis not present

## 2018-04-07 ENCOUNTER — Other Ambulatory Visit: Payer: Self-pay

## 2018-04-07 ENCOUNTER — Inpatient Hospital Stay: Payer: Medicare HMO | Attending: Oncology | Admitting: Oncology

## 2018-04-07 ENCOUNTER — Encounter: Payer: Self-pay | Admitting: Oncology

## 2018-04-07 ENCOUNTER — Other Ambulatory Visit: Payer: Medicare HMO

## 2018-04-07 ENCOUNTER — Ambulatory Visit (INDEPENDENT_AMBULATORY_CARE_PROVIDER_SITE_OTHER): Payer: Medicare HMO

## 2018-04-07 VITALS — BP 115/71 | HR 63 | Temp 97.4°F | Resp 18 | Ht 67.0 in | Wt 161.8 lb

## 2018-04-07 DIAGNOSIS — D631 Anemia in chronic kidney disease: Secondary | ICD-10-CM | POA: Insufficient documentation

## 2018-04-07 DIAGNOSIS — N183 Chronic kidney disease, stage 3 (moderate): Secondary | ICD-10-CM | POA: Diagnosis not present

## 2018-04-07 DIAGNOSIS — D696 Thrombocytopenia, unspecified: Secondary | ICD-10-CM | POA: Diagnosis not present

## 2018-04-07 DIAGNOSIS — Z953 Presence of xenogenic heart valve: Secondary | ICD-10-CM

## 2018-04-07 DIAGNOSIS — I129 Hypertensive chronic kidney disease with stage 1 through stage 4 chronic kidney disease, or unspecified chronic kidney disease: Secondary | ICD-10-CM | POA: Insufficient documentation

## 2018-04-07 DIAGNOSIS — N189 Chronic kidney disease, unspecified: Secondary | ICD-10-CM

## 2018-04-07 MED ORDER — PERFLUTREN LIPID MICROSPHERE
1.0000 mL | INTRAVENOUS | Status: AC | PRN
  Administered 2018-04-07: 2 mL via INTRAVENOUS

## 2018-04-07 NOTE — Progress Notes (Signed)
No new changes noted today 

## 2018-04-07 NOTE — Progress Notes (Signed)
Hematology/Oncology Consult note [facility]  [phone] [phone]  Patient Care Team: Remi Haggard, FNP as PCP - General (Family Medicine) End, Harrell Gave, MD as PCP - Cardiology (Cardiology)   Name of the patient: Rodney Wiley  [id number]  1936/09/01   Date of visit: 04/07/18  Diagnosis- 1. Anemia likely due to CKD 2. Mild thrombocytopenia- unclear etiology  Chief complaint/ Reason for visit- discuss results of bloodwork  Heme/Onc history: patient is a 82 year old male with a past medical history significant for hypertension, hyperlipidemia, stage III CKD and history of aortic insufficiency as well as ascending aortic aneurysm.  He recently underwent bioprosthetic aortic valve replacement, CABG x3 and resuspension of the left atrium on 02/22/2018.  He has been referred to [place] for evaluation of thrombocytopenia.  He is recovering slowly from his recent procedure.  He reports fatigue and some good days and some not so good days when he is more fatigued.  He denies any chest pain or exertional shortness of breath.  Denies any bleeding or bruising.  He follows up with nephrology for his chronic kidney disease  Results of blood work from 03/24/2018 were as follows: CBC showed white count of 7.7, H&H 11.4/34.1 with an MCV of 93.9 and a platelet count of 134.  CMP showed elevated creatinine of 2.4.  AST and ALT were normal.  Folate and B12 are normal.  Ferritin and iron studies were normal.  Haptoglobin and TSH was normal.  Multiple myeloma panel revealed no monoclonal protein.  Interval history- reports feeling significantly better in terms of his fatigue over the last 2 weeks. Still does not feel back to his baseline  ECOG PS- 1 Pain scale- 0  Review of systems- Review of Systems  Constitutional: Positive for malaise/fatigue. Negative for chills, fever and weight loss.  HENT: Negative for congestion, ear discharge and nosebleeds.     Eyes: Negative for blurred vision.  Respiratory: Negative for cough, hemoptysis, sputum production, shortness of breath and wheezing.   Cardiovascular: Negative for chest pain, palpitations, orthopnea and claudication.  Gastrointestinal: Negative for abdominal pain, blood in stool, constipation, diarrhea, heartburn, melena, nausea and vomiting.  Genitourinary: Negative for dysuria, flank pain, frequency, hematuria and urgency.  Musculoskeletal: Negative for back pain, joint pain and myalgias.  Skin: Negative for rash.  Neurological: Negative for dizziness, tingling, focal weakness, seizures, weakness and headaches.  Endo/Heme/Allergies: Does not bruise/bleed easily.  Psychiatric/Behavioral: Negative for depression and suicidal ideas. The patient does not have insomnia.      No Known Allergies   Past Medical History:  Diagnosis Date  . Aortic insufficiency    a. 02/2018 s/p bioprosthetic AVR.  [name] Kitchen Arthropathy   . Ascending aortic aneurysm ([place])    a. 12/2016 MRA: 4.3cm @ sinus of valsalva, 4.9cm above sinotubular jxn; b. 02/2018 s/p biological Bentall and AVR.  [name] Kitchen BPH (benign prostatic hyperplasia)   . 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 02/2018 Cath: LAD 20ost, 40p, 69m LCX 60p, OM2 60, OM3 85, RCA 10p/m w/ L->R and R->R collats;  b. 02/2018 CABG x 3: LIMA to LAD, SVG to OM2, SVG to PDA, EVH via right thigh.  . CKD (chronic kidney disease), stage III ([place]   . Diverticulitis   . Essential hypertension   . GERD (gastroesophageal reflux disease)   . Hemorrhoids   . History of chicken pox   . History of Helicobacter pylori infection 06/2007  . Hyperlipidemia    unspecified  .  Hypertension   . Left renal artery stenosis (HCC)    a. 01/2017 RA u/s: moderate L RAS.  [name] Kitchen Lower extremity edema   . Nonrheumatic mitral (valve) insufficiency    a. 12/2016 Echo: mild to mod MR; b. 09/2017 TEE: mild to mod MR.  [name] Kitchen Nonrheumatic pulmonary valve insufficiency   . 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 s/p 21 mm Edwards Inspiris Resilia stented bovine pericardial tissue valve and 24 mm Gelweave Valsalva synthetic root conduit with reimplantation of left main coronary artery.     Past Surgical History:  Procedure Laterality Date  . AORTIC VALVE REPAIR N/A 02/22/2018   Procedure: AORTIC VALVE REPLACEMENT -Biological Bentall aortic root replacement,;  Surgeon: Rexene Alberts, MD;  Location: [place];  Service: Open Heart Surgery;  Laterality: N/A;  . bypass surgery    . CARDIAC CATHETERIZATION     01/16/2018  . COLONOSCOPY  2011   by Dr. Candace Cruise with findings of diverticulosis  . CORONARY ARTERY BYPASS GRAFT N/A 02/22/2018   Procedure: CORONARY ARTERY BYPASS GRAFTING (CABG) x three, using left internal mammary artery and right leg greater saphenous vein harvested endoscopically;  Surgeon: Rexene Alberts, MD;  Location: [place];  Service: Open Heart Surgery;  Laterality: N/A;  . CORONARY/GRAFT ANGIOGRAPHY N/A 01/16/2018   Procedure: CORONARY/GRAFT ANGIOGRAPHY;  Surgeon: Nelva Bush, MD;  Location: [place] CV [facility];  Service: Cardiovascular;  Laterality: N/A;  . ELBOW SURGERY Left   . hemorhoidectomy    . RIGHT HEART CATH N/A 01/16/2018   Procedure: RIGHT HEART CATH;  Surgeon: Nelva Bush, MD;  Location: [place] [facility];  Service: Cardiovascular;  Laterality: N/A;  . SHOULDER ARTHROSCOPY WITH ROTATOR CUFF REPAIR Right   . SHOULDER SURGERY Right   . TEE WITHOUT CARDIOVERSION N/A 09/27/2017   Procedure: TRANSESOPHAGEAL ECHOCARDIOGRAM (TEE);  Surgeon: Dorothy Spark, MD;  Location[facility] END[facility];  Service: Cardiovascular;  Laterality: N/A;  . TEE WITHOUT CARDIOVERSION N/A 02/22/2018   Procedure: TRANSESOPHAGEAL ECHOCARDIOGRAM (TEE);  Surgeon: Rexene Alberts, MD;  Location: [place];  Service: Open Heart Surgery;  Laterality: N/A;  . THORACIC AORTIC ANEURYSM REPAIR N/A 02/22/2018   Procedure: THORACIC ASCENDING  ANEURYSM REPAIR (AAA) Resection of ascending aorta aneurysm;  Surgeon: Rexene Alberts, MD;  Location: [facility] OR;  Service: Open Heart Surgery;  Laterality: N/A;    Social History   Socioeconomic History  . Marital status: Unknown    Spouse name: Not on file  . Number of children: Not on file  . Years of education: Not on file  . Highest education level: Not on file  Occupational History  . Not on file  Social Needs  . Financial resource strain: Not on file  . Food insecurity:    Worry: Not on file    Inability: Not on file  . Transportation needs:    Medical: Not on file    Non-medical: Not on file  Tobacco Use  . Smoking status: Never Smoker  . Smokeless tobacco: Never Used  Substance and Sexual Activity  . Alcohol use: No  . Drug use: No  . Sexual activity: Not on file  Lifestyle  . Physical activity:    Days per week: Not on file    Minutes per session: Not on file  . Stress: Not on file  Relationships  . Social connections:    Talks on phone: Not on file    Gets together: Not  on file    Attends religious service: Not on file    Active member of club or organization: Not on file    Attends meetings of clubs or organizations: Not on file    Relationship status: Not on file  . Intimate partner violence:    Fear of current or ex partner: Not on file    Emotionally abused: Not on file    Physically abused: Not on file    Forced sexual activity: Not on file  Other Topics Concern  . Not on file  Social History Narrative  . Not on file    Family History  Problem Relation Age of Onset  . Hypertension Mother   . Stroke Mother   . Leukemia Father   . Heart attack Paternal Grandmother   . Stroke Paternal Grandfather   . Heart Problems Son 51  . Heart Problems Son 5  . Uterine cancer Sister   . Prostate cancer Brother   . Colon cancer Brother   . Stroke Sister      Current Outpatient Medications:  .  amiodarone (PACERONE) 200 MG tablet, Take 1 tablet (200 mg  total) by mouth 2 (two) times daily after a meal., Disp: 60 tablet, Rfl: 1 .  atorvastatin (LIPITOR) 20 MG tablet, Take 1 tablet (20 mg total) by mouth daily., Disp: 90 tablet, Rfl: 2 .  carvedilol (COREG) 3.125 MG tablet, Take 1 tablet (3.125 mg total) by mouth 2 (two) times daily., Disp: 180 tablet, Rfl: 3 .  Cholecalciferol (VITAMIN D3) 2000 units capsule, Take 2,000 Units by mouth daily. , Disp: , Rfl:  .  doxazosin (CARDURA) 2 MG tablet, Take 2 mg by mouth daily., Disp: , Rfl:  .  ferrous sulfate 325 (65 FE) MG EC tablet, Take 325 mg by mouth 2 (two) times daily. , Disp: , Rfl:  .  folic acid-pyridoxine-cyancobalamin (FOLTX) 2.5-25-2 MG TABS tablet, Take 1 tablet by mouth daily., Disp: 30 each, Rfl:  .  potassium chloride (K-DUR,KLOR-CON) 10 MEQ tablet, Take 10 mEq by mouth daily., Disp: , Rfl:  .  sennosides-docusate sodium (SENOKOT-S) 8.6-50 MG tablet, Take 1 tablet by mouth 2 (two) times daily. , Disp: , Rfl:  .  vitamin C (ASCORBIC ACID) 500 MG tablet, Take 500 mg by mouth 2 (two) times daily., Disp: , Rfl:  .  warfarin (COUMADIN) 5 MG tablet, Take 5 mg by mouth daily., Disp: , Rfl:  .  dextromethorphan-guaiFENesin (MUCINEX DM) 30-600 MG 12hr tablet, Take 1 tablet by mouth 2 (two) times daily. (Patient not taking: Reported on 04/07/2018), Disp: , Rfl:  .  fluticasone-salmeterol (ADVAIR HFA) 115-21 MCG/ACT inhaler, Inhale 2 puffs into the lungs 2 (two) times daily. Use with spacer/ Aerochamber, Disp: , Rfl:  .  Nutritional Supplements (ENSURE ENLIVE PO), Take 1 Bottle by mouth 2 (two) times daily between meals., Disp: , Rfl:   Physical exam:  Vitals:   04/07/18 1015  BP: 115/71  Pulse: 63  Resp: 18  Temp: (!) 97.4 F (36.3 C)  TempSrc: Tympanic  SpO2: 98%  Weight: 161 lb 12.8 oz (73.4 kg)  Height: '5\' 7"'  (1.702 m)   Physical Exam  Constitutional: He is oriented to person, place, and time. He appears well-developed and well-nourished.  HENT:  Head: Normocephalic and atraumatic.    Eyes: Pupils are equal, round, and reactive to light. EOM are normal.  Neck: Normal range of motion.  Cardiovascular: Normal rate and regular rhythm.  Systolic murmur +  Pulmonary/Chest: Effort normal  and breath sounds normal.  Abdominal: Soft. Bowel sounds are normal.  Neurological: He is alert and oriented to person, place, and time.  Skin: Skin is warm and dry.     CMP Latest Ref Rng & Units 03/27/2018  Glucose 65 - 99 mg/dL 118(H)  BUN 6 - 20 mg/dL 25(H)  Creatinine 0.61 - 1.24 mg/dL 2.12(H)  Sodium 135 - 145 mmol/L 136  Potassium 3.5 - 5.1 mmol/L 4.2  Chloride 101 - 111 mmol/L 101  CO2 22 - 32 mmol/L 27  Calcium 8.9 - 10.3 mg/dL 8.2(L)  Total Protein 6.5 - 8.1 g/dL -  Total Bilirubin 0.3 - 1.2 mg/dL -  Alkaline Phos 38 - 126 U/L -  AST 15 - 41 U/L -  ALT 17 - 63 U/L -   CBC Latest Ref Rng & Units 03/27/2018  WBC 3.8 - 10.6 K/uL 6.2  Hemoglobin 13.0 - 18.0 g/dL 10.9(L)  Hematocrit 40.0 - 52.0 % 31.7(L)  Platelets 150 - 440 K/uL 135(L)    No images are attached to the encounter.  Dg Chest 2 View  Result Date: 03/27/2018 CLINICAL DATA:  Status post aortic valve repair and thoracic aortic aneurysm repair on 02/22/2018. Status post coronary artery bypass grafts on 02/22/2018. EXAM: CHEST - 2 VIEW COMPARISON:  Chest x-rays dated 03/01/2018 and 02/20/2018 FINDINGS: Almost complete resolution of the pleural effusions since the prior study with only minimal residual blunting of the posterior costophrenic angles. Heart size and vascularity are normal. Lungs are clear. No pneumothorax. Slight elevation of the right hemidiaphragm which is new since 02/20/2018. Aortic atherosclerosis.  No acute bone abnormality. IMPRESSION: 1. Tiny residual bilateral pleural effusions. 2. New slight elevation of the right hemidiaphragm. Electronically Signed   By: Lorriane Shire M.D.   On: 03/27/2018 13:45     Assessment and plan- Patient is a 82 y.o. male with following issues:  1.  Thrombocytopenia- this is mild. plateelt count between 130-140. No bleeding/ bruising. B12, folate are normal. Continue to monitor  2. Normocytic anemia likely due to CKD- discussed results of blood work as above and there is no other etiology of anemia apparent at this time other than CKD. His hb is >10 and he does not need EPO at this time.   I will see him back in 6 months with cbc with diff   Visit Diagnosis 1. Anemia of chronic renal failure, unspecified CKD stage   2. Thrombocytopenia ([place])      Dr. Randa Evens, MD, MPH [facility] [phone] 04/07/2018 12:38 PM

## 2018-04-08 LAB — ECHOCARDIOGRAM COMPLETE
Height: 67 in
Weight: 2588.8 oz

## 2018-04-12 ENCOUNTER — Encounter: Payer: Self-pay | Admitting: Urology

## 2018-04-12 ENCOUNTER — Ambulatory Visit: Payer: Medicare HMO | Admitting: Urology

## 2018-04-12 ENCOUNTER — Ambulatory Visit (INDEPENDENT_AMBULATORY_CARE_PROVIDER_SITE_OTHER): Payer: Medicare HMO

## 2018-04-12 DIAGNOSIS — Z953 Presence of xenogenic heart valve: Secondary | ICD-10-CM

## 2018-04-12 DIAGNOSIS — Z7901 Long term (current) use of anticoagulants: Secondary | ICD-10-CM

## 2018-04-12 DIAGNOSIS — D649 Anemia, unspecified: Secondary | ICD-10-CM | POA: Diagnosis not present

## 2018-04-12 LAB — POCT INR: INR: 4.8

## 2018-04-12 NOTE — Patient Instructions (Signed)
Please hold coumadin tonight and tomorrow, then continue of 1/2 tablet daily.  Please pick a day of the week to have greens and have them every week on that day.  Recheck in 1 week.

## 2018-04-19 ENCOUNTER — Ambulatory Visit (INDEPENDENT_AMBULATORY_CARE_PROVIDER_SITE_OTHER): Payer: Medicare HMO

## 2018-04-19 DIAGNOSIS — Z953 Presence of xenogenic heart valve: Secondary | ICD-10-CM

## 2018-04-19 DIAGNOSIS — D649 Anemia, unspecified: Secondary | ICD-10-CM | POA: Diagnosis not present

## 2018-04-19 DIAGNOSIS — Z7901 Long term (current) use of anticoagulants: Secondary | ICD-10-CM

## 2018-04-19 LAB — POCT INR: INR: 2

## 2018-04-19 NOTE — Patient Instructions (Signed)
Please continue of 1/2 tablet daily.  Please pick a day of the week to have greens and have them every week on that day.  Recheck in 2 weeks.

## 2018-04-28 DIAGNOSIS — N183 Chronic kidney disease, stage 3 (moderate): Secondary | ICD-10-CM | POA: Diagnosis not present

## 2018-04-28 DIAGNOSIS — N281 Cyst of kidney, acquired: Secondary | ICD-10-CM | POA: Diagnosis not present

## 2018-05-02 ENCOUNTER — Telehealth: Payer: Self-pay | Admitting: Internal Medicine

## 2018-05-02 NOTE — Telephone Encounter (Signed)
Called and s/w patient's daughter in law, ok per DPR. Says patient's BP has been trending higher in the mornings: 162/82 and 152/91, HR between 67-71.  These are BP's prior to taking morning medications. This has been going on for about 1 week.  Patient has become more active and moving around.  She says in the evening the BP is not as high (does not have the readings). Yesterday, home health nurse took BP in the afternoon and it was 128/80.  Advised her to have patient take BP/HR about 2 hours after taking morning meds and to call us with the readings. She said she would call us or send a MyChart message with the readings.

## 2018-05-02 NOTE — Telephone Encounter (Signed)
Pt daughter in law states pt BP this am 162/82. Yesterday was 152/91, the rest of the weekend was the same readings. Please call.

## 2018-05-03 ENCOUNTER — Other Ambulatory Visit: Payer: Self-pay

## 2018-05-03 ENCOUNTER — Ambulatory Visit (INDEPENDENT_AMBULATORY_CARE_PROVIDER_SITE_OTHER): Payer: Medicare HMO

## 2018-05-03 DIAGNOSIS — D649 Anemia, unspecified: Secondary | ICD-10-CM

## 2018-05-03 DIAGNOSIS — Z7901 Long term (current) use of anticoagulants: Secondary | ICD-10-CM | POA: Diagnosis not present

## 2018-05-03 DIAGNOSIS — N281 Cyst of kidney, acquired: Secondary | ICD-10-CM | POA: Diagnosis not present

## 2018-05-03 DIAGNOSIS — Z953 Presence of xenogenic heart valve: Secondary | ICD-10-CM

## 2018-05-03 DIAGNOSIS — N183 Chronic kidney disease, stage 3 (moderate): Secondary | ICD-10-CM | POA: Diagnosis not present

## 2018-05-03 LAB — POCT INR: INR: 2.8 (ref 2.0–3.0)

## 2018-05-03 MED ORDER — CARVEDILOL 3.125 MG PO TABS: 3.1250 mg | ORAL_TABLET | Freq: Two times a day (BID) | ORAL | 3 refills | Status: DC

## 2018-05-03 MED ORDER — AMIODARONE HCL 200 MG PO TABS: 200.0000 mg | ORAL_TABLET | Freq: Every day | ORAL | 3 refills | Status: DC

## 2018-05-03 NOTE — Telephone Encounter (Signed)
I think it is reasonable to monitor BP at home.  If it is consistent above 130/80, I recommend that we restart amlodipine 5 mg daily and continue the remainder of his medications.  Nelva Bush, MD [facility] Pager: [phone]

## 2018-05-03 NOTE — Telephone Encounter (Signed)
Rodney Wiley called back. She verbalized understanding to monitor BP at home and let us know if runs consistently above 130/80 and to call us to add amlodipine if needed. Patient needed refills on amiodarone and carvedilol. Refills sent in.

## 2018-05-03 NOTE — Telephone Encounter (Signed)
No answer. Left message to call back.   

## 2018-05-03 NOTE — Telephone Encounter (Signed)
*  STAT* If patient is at the pharmacy, call can be transferred to refill team.   1. Which medications need to be refilled? (please list name of each medication and dose if known) Amiodarone, Carvedilol  2. Which pharmacy/location (including street and city if local pharmacy) is medication to be sent to? [facility] [place]  3. Do they need a 30 day or 90 day supply? [place]

## 2018-05-03 NOTE — Patient Instructions (Signed)
Please continue of 1/2 tablet daily.  Please pick a day of the week to have greens and have them every week on that day.  Recheck in 3 weeks.

## 2018-05-04 DIAGNOSIS — I10 Essential (primary) hypertension: Secondary | ICD-10-CM | POA: Diagnosis not present

## 2018-05-04 DIAGNOSIS — D696 Thrombocytopenia, unspecified: Secondary | ICD-10-CM | POA: Diagnosis not present

## 2018-05-04 DIAGNOSIS — Z0000 Encounter for general adult medical examination without abnormal findings: Secondary | ICD-10-CM | POA: Diagnosis not present

## 2018-05-04 DIAGNOSIS — E559 Vitamin D deficiency, unspecified: Secondary | ICD-10-CM | POA: Diagnosis not present

## 2018-05-04 DIAGNOSIS — E538 Deficiency of other specified B group vitamins: Secondary | ICD-10-CM | POA: Diagnosis not present

## 2018-05-04 DIAGNOSIS — R5382 Chronic fatigue, unspecified: Secondary | ICD-10-CM | POA: Diagnosis not present

## 2018-05-04 DIAGNOSIS — D649 Anemia, unspecified: Secondary | ICD-10-CM | POA: Diagnosis not present

## 2018-05-04 DIAGNOSIS — E7800 Pure hypercholesterolemia, unspecified: Secondary | ICD-10-CM | POA: Diagnosis not present

## 2018-05-04 DIAGNOSIS — R7989 Other specified abnormal findings of blood chemistry: Secondary | ICD-10-CM | POA: Diagnosis not present

## 2018-05-04 DIAGNOSIS — R5383 Other fatigue: Secondary | ICD-10-CM | POA: Diagnosis not present

## 2018-05-04 DIAGNOSIS — R7301 Impaired fasting glucose: Secondary | ICD-10-CM | POA: Diagnosis not present

## 2018-05-16 ENCOUNTER — Ambulatory Visit: Payer: Medicare HMO | Admitting: Internal Medicine

## 2018-05-16 ENCOUNTER — Encounter: Payer: Self-pay | Admitting: Internal Medicine

## 2018-05-16 VITALS — BP 128/70 | HR 64 | Ht 67.0 in | Wt 171.0 lb

## 2018-05-16 DIAGNOSIS — I480 Paroxysmal atrial fibrillation: Secondary | ICD-10-CM

## 2018-05-16 DIAGNOSIS — Z79899 Other long term (current) drug therapy: Secondary | ICD-10-CM

## 2018-05-16 DIAGNOSIS — I351 Nonrheumatic aortic (valve) insufficiency: Secondary | ICD-10-CM

## 2018-05-16 DIAGNOSIS — I2510 Atherosclerotic heart disease of native coronary artery without angina pectoris: Secondary | ICD-10-CM

## 2018-05-16 DIAGNOSIS — I10 Essential (primary) hypertension: Secondary | ICD-10-CM

## 2018-05-16 DIAGNOSIS — Z953 Presence of xenogenic heart valve: Secondary | ICD-10-CM

## 2018-05-16 DIAGNOSIS — I712 Thoracic aortic aneurysm, without rupture, unspecified: Secondary | ICD-10-CM

## 2018-05-16 DIAGNOSIS — I5032 Chronic diastolic (congestive) heart failure: Secondary | ICD-10-CM | POA: Diagnosis not present

## 2018-05-16 MED ORDER — TORSEMIDE 20 MG PO TABS: 20.0000 mg | ORAL_TABLET | Freq: Two times a day (BID) | ORAL | 3 refills | Status: DC

## 2018-05-16 NOTE — Progress Notes (Signed)
Follow-up Outpatient Visit Date: 05/16/2018  Primary Care Provider: Remi Haggard, [place] [address]  Chief Complaint: Follow-up CAD, AVR, and root repair  HPI:  Rodney Wiley is a 82 y.o. year-old male with history of hypertension, coronary artery disease status post CABG (3/19) aortic regurgitation and thoracic aortic aneurysm status post Bentall with bioprosthetic aortic valve replacement (3/19), and chronic kidney disease stage 3with left renal artery stenosis, who presents for follow-up of CAD and valvular heart disease.  He was last seen in our office on 03/27/2018 following preceding thoracic surgery.  Postoperative course was complicated by anemia, paroxysmal atrial fibrillation, and slight worsening of baseline chronic kidney disease.  Today, Rodney Wiley reports feeling well.  However, over the last 3 days, he has experienced increased leg swelling as well as a 5 pound weight gain.  He denies angina but continues to have vague discomfort on the left side of the chest wall.  He states that it feels as though it is "not attached right."  The discomfort abates if he grasps a pillow across his chest.  He has not had any significant shortness of breath nor palpitations or lightheadedness.  He has bruises easily but has not had any significant bleeding, remaining on warfarin.  He is scheduled for follow-up with Dr. Roxy Manns later this month and would like to begin cardiac rehab in the near future.  Rodney Wiley is following with [facility] nephrology in [place].  Creatinine has improved following cardiac surgery.  He is currently off all diuretics.  Home blood pressures have been gradually trending up, typically ranging from 121-180/73-92.  --------------------------------------------------------------------------------------------------  Cardiovascular History & Procedures: Cardiovascular Problems:  Thoracic aortic aneurysm  Aortic regurgitation  Mitral  regurgitation  Renal artery stenosis  Risk Factors:  Hypertension, hyperlipidemia, male gender, and age > 55  Cath/PCI:  Coronary angiography/RHC (01/16/2018): LMCA normal.  LAD with sequential 20% ostial, 40% proximal, and long 70% proximal to mid stenoses.  LCx with 60% proximal stenosis and 60 to 80-90% OM2 and OM3 lesions.  Chronic total occlusion of the proximal/mid RCA with left-to-right collaterals.  RA 9, RV 43/10, PA 37/15 (22), and PCWP 18.  Fick CO/CI 5.1/2.6.  CV Surgery:  CABG and Bentall with bioprosthetic AVR (02/22/2018): LIMA to LAD, SVG to OM2, and SVG to PDA.  24 mm synthetic aortic root graft and 21 mm Edwards Inspiris Roselia stented bovine pericardial tissue valve.  EP Procedures and Devices:  None  Non-Invasive Evaluation(s):  TTE (04/07/2018): Normal LV size with mild LVH.  LVEF 50 to 55% with anteroseptal and septal hypokinesis.  Grade 2 diastolic dysfunction.  Bioprosthetic aortic valve present with mean gradient of 17 mmHg.  Mild mitral regurgitation.  Normal RV size and function.  No pericardial effusion.  TEE (09/27/17): Mildly dilated LV with LVEF 60-65%. Moderate aortic regurgitation. Mild to moderate mitral regurgitation. Mild right atrial enlargement. Moderate aortic dilation, with root measuring 5.0 cm and ascending aorta 4.7 cm  MRA chest (06/06/17): Stable ascending aortic aneurysm, measuring 5.0 cm atthe sinotubular junction and 4.8 cm at the level of the main pulmonary artery.  Renal artery Doppler (01/13/17): Aortic atherosclerosis without stenosis or aneurysm. Normal right renal artery. Mild to moderate left renal artery stenosis (1-59%). Normal/symmetric kidney size.  MRA chest (12/27/16): Aneurysmal disease of the ascending aorta, measuring 4.3 cm at the sinus of Valsalva and 4.9 cm above the sinotubular junction.  TTE (12/14/16): Mildly dilated LV with mild LVH. Normal contraction with LVEF 55-60%. Grade  1 diastolic dysfunction. Mild to moderate  aortic regurgitation. Dilated aorta, measuring up to 4.9 cm above the sinotubular junction. Mild to moderate mitral regurgitation. Mild left atrial enlargement. Normal RV size and function.  Transthoracic echo (09/2016, [place]): Full report not available. Reportedly showed LVH with LVEF 67% and diastolic dysfunction. MR and moderate to severe AI present with sclerotic aortic valve.  Carotid Doppler (10/201&): Full report not available. Reportedly showed 1-39% stenosis bilaterally.  Transthoracic echo (02/18/16, [facility]): Normal LV and RV size and function. LVEF >55%. Moderate AI; mild MR and TR.   Recent CV Pertinent Labs: Lab Results  Component Value Date   CHOL 106 03/11/2017   CHOL 186 01/13/2017   HDL 28 (L) 03/11/2017   HDL 30 (L) 01/13/2017   LDLCALC 39 03/11/2017   LDLCALC 95 01/13/2017   TRIG 194 (H) 03/11/2017   CHOLHDL 3.8 03/11/2017   INR 2.8 05/03/2018   INR 7.53 (HH) 03/27/2018   K 4.2 03/27/2018   MG 2.8 (H) 02/24/2018   BUN 25 (H) 03/27/2018   BUN 27 03/22/2017   CREATININE 2.12 (H) 03/27/2018    Past medical and surgical history were reviewed and updated in EPIC.  Current Meds  Medication Sig  . amiodarone (PACERONE) 200 MG tablet Take 1 tablet (200 mg total) by mouth daily.  [name] Kitchen amLODipine (NORVASC) 10 MG tablet Take 10 mg by mouth daily.  [name] Kitchen atorvastatin (LIPITOR) 20 MG tablet Take 1 tablet (20 mg total) by mouth daily.  . carvedilol (COREG) 3.125 MG tablet Take 1 tablet (3.125 mg total) by mouth 2 (two) times daily.  . Cholecalciferol (VITAMIN D3) 2000 units capsule Take 2,000 Units by mouth daily.   [name] Kitchen doxazosin (CARDURA) 2 MG tablet Take 2 mg by mouth daily.  . ferrous sulfate 325 (65 FE) MG EC tablet Take 325 mg by mouth 2 (two) times daily.   . fluticasone-salmeterol (ADVAIR HFA) 115-21 MCG/ACT inhaler Inhale 2 puffs into the lungs 2 (two) times daily. Use with spacer/ Aerochamber  . folic acid-pyridoxine-cyancobalamin  (FOLTX) 2.5-25-2 MG TABS tablet Take 1 tablet by mouth daily.  . sennosides-docusate sodium (SENOKOT-S) 8.6-50 MG tablet Take 1 tablet by mouth 2 (two) times daily.   . vitamin C (ASCORBIC ACID) 500 MG tablet Take 500 mg by mouth 2 (two) times daily.  [name] Kitchen warfarin (COUMADIN) 5 MG tablet Take 5 mg by mouth daily.    Allergies: Patient has no known allergies.  Social History   Tobacco Use  . Smoking status: Never Smoker  . Smokeless tobacco: Never Used  Substance Use Topics  . Alcohol use: No  . Drug use: No    Family History  Problem Relation Age of Onset  . Hypertension Mother   . Stroke Mother   . Leukemia Father   . Heart attack Paternal Grandmother   . Stroke Paternal Grandfather   . Heart Problems Son 51  . Heart Problems Son 46  . Uterine cancer Sister   . Prostate cancer Brother   . Colon cancer Brother   . Stroke Sister     Review of Systems: Rodney Wiley has experienced numbness along the ulnar aspect of the right forearm since his cardiac surgery.  He also notes that it is difficult to get an accurate blood pressure reading on the left arm due to a remote fracture to the left upper arm.  Otherwise, a 12-system review of systems was performed and was negative except as noted in the HPI.  --------------------------------------------------------------------------------------------------  Physical Exam: BP 128/70 (BP Location: Right Arm, Patient Position: Sitting, Cuff Size: Normal)   Pulse 64   Ht 5\' 7"  (1.702 m)   Wt 171 lb (77.6 kg)   BMI 26.78 kg/m   Repeat BP 160/70  General: NAD. HEENT: No conjunctival pallor or scleral icterus. Moist mucous membranes.  OP clear. Neck: Supple without lymphadenopathy, thyromegaly, JVD, or HJR.  Lungs: Normal work of breathing. Clear to auscultation bilaterally without wheezes or crackles. Heart: Regular rate and rhythm without murmurs, rubs, or gallops. Non-displaced PMI. Abd: Bowel sounds present. Soft, NT/ND without  hepatosplenomegaly Ext: 1+ pretibial edema to the proximal calves bilaterally. Skin: Warm and dry without rash.  EKG: Normal sinus rhythm with first-degree AV block and occasional PVCs.  Left axis deviation and poor R wave progression.  PVC is new since 03/27/2018.  Otherwise, no significant interval change.  Lab Results  Component Value Date   WBC 6.2 03/27/2018   HGB 10.9 (L) 03/27/2018   HCT 31.7 (L) 03/27/2018   MCV 93.5 03/27/2018   PLT 135 (L) 03/27/2018    Lab Results  Component Value Date   NA 136 03/27/2018   K 4.2 03/27/2018   CL 101 03/27/2018   CO2 27 03/27/2018   BUN 25 (H) 03/27/2018   CREATININE 2.12 (H) 03/27/2018   GLUCOSE 118 (H) 03/27/2018   ALT 28 03/24/2018    Lab Results  Component Value Date   CHOL 106 03/11/2017   HDL 28 (L) 03/11/2017   LDLCALC 39 03/11/2017   TRIG 194 (H) 03/11/2017   CHOLHDL 3.8 03/11/2017    --------------------------------------------------------------------------------------------------  ASSESSMENT AND PLAN: Coronary artery disease without angina Rodney Wiley is recovering well from his CABG in March.  He continues to have chest wall pain, which will hopefully improve with time.  He is scheduled to follow-up with Dr. ON later this month.  In the meantime, we will continue carvedilol, atorvastatin, amlodipine, and warfarin (in lieu of aspirin).  Aortic regurgitation and thoracic aortic aneurysm status post repair Symptomatically doing well compared to before surgery.  Continue with warfarin given bioprosthetic AVR and postoperative atrial fibrillation.  Hopefully, we can transition him from warfarin to aspirin in the near future.  I will defer this to Dr. Roxy Manns.  Paroxysmal atrial fibrillation Postoperative course complicated by atrial fibrillation.  Rodney Wiley has been in sinus rhythm at his last several follow-up visits and does not have any symptoms of atrial fibrillation.  He remains on amiodarone and warfarin.  Hopefully,  amiodarone can be discontinued and warfarin transitioned to aspirin if he has no evidence of recurrence as he approaches the 29-month anniversary of his surgery.  Chronic diastolic heart failure Rodney Wiley reports increased leg swelling as well as weight gain over the last few days.  We will restart torsemide 20 mg daily.  I will check a basic metabolic panel today and again in 1 week to ensure her stable renal function and potassium.  Hypertension Blood pressure suboptimally controlled today.  Hopefully, with diuresis, his blood pressure will improve somewhat.  We will need to consider escalating his regimen again in the future if his blood pressure remains above goal.  Follow-up: Labs and blood pressure check in 1 week.  Return for clinic visit in 1 month.  Nelva Bush, MD 05/16/2018 3:41 PM

## 2018-05-16 NOTE — Patient Instructions (Signed)
Medication Instructions:  Your physician has recommended you make the following change in your medication:  1- START Torsemide 20 mg by mouth once a day.   Labwork: Your physician recommends that you return for lab work in: [place] (BMET).  Your physician recommends that you return for lab work in: about [place] OFFICE WITH BLOOD PRESSURE CHECK.   Testing/Procedures: none  Follow-Up: Your physician recommends that you schedule a follow-up appointment in: [place] BP CHECK AND LAB DRAW.  Your physician recommends that you schedule a follow-up appointment in: 1 MONTHS WITH DR END OR APP.   If you need a refill on your cardiac medications before your next appointment, please call your pharmacy.

## 2018-05-17 DIAGNOSIS — Z79899 Other long term (current) drug therapy: Secondary | ICD-10-CM | POA: Insufficient documentation

## 2018-05-17 DIAGNOSIS — I5032 Chronic diastolic (congestive) heart failure: Secondary | ICD-10-CM | POA: Insufficient documentation

## 2018-05-17 DIAGNOSIS — I480 Paroxysmal atrial fibrillation: Secondary | ICD-10-CM | POA: Insufficient documentation

## 2018-05-17 LAB — BASIC METABOLIC PANEL
BUN/Creatinine Ratio: 13 (ref 10–24)
BUN: 26 mg/dL (ref 8–27)
CO2: 19 mmol/L — ABNORMAL LOW (ref 20–29)
Calcium: 8.5 mg/dL — ABNORMAL LOW (ref 8.6–10.2)
Chloride: 109 mmol/L — ABNORMAL HIGH (ref 96–106)
Creatinine, Ser: 1.95 mg/dL — ABNORMAL HIGH (ref 0.76–1.27)
GFR calc Af Amer: 37 mL/min/{1.73_m2} — ABNORMAL LOW (ref >59)
GFR calc non Af Amer: 32 mL/min/{1.73_m2} — ABNORMAL LOW (ref >59)
Glucose: 107 mg/dL — ABNORMAL HIGH (ref 65–99)
Potassium: 4.3 mmol/L (ref 3.5–5.2)
Sodium: 142 mmol/L (ref 134–144)

## 2018-05-18 ENCOUNTER — Telehealth: Payer: Self-pay | Admitting: *Deleted

## 2018-05-18 MED ORDER — TORSEMIDE 20 MG PO TABS: 20.0000 mg | ORAL_TABLET | Freq: Every day | ORAL | 3 refills | Status: DC

## 2018-05-18 MED ORDER — TORSEMIDE 20 MG PO TABS: 20.0000 mg | ORAL_TABLET | Freq: Once | ORAL | 3 refills | Status: DC

## 2018-05-18 NOTE — Telephone Encounter (Signed)
-----   Message from Nelva Bush, MD sent at 05/17/2018  1:21 PM EDT ----- Claris Gladden,  Do you mind touching base with [name] to make sure that he is taking his torsemide 20 mg just once daily.  It is written as such in the AVS but it appears to be ordered for twice daily on his medication list.  Let me know if any issues come up.  Thanks.  Gerald Stabs

## 2018-05-18 NOTE — Telephone Encounter (Signed)
Called patient. He is only taking the torsemide 20 mg once a day. He stated, "I couldn't tolerate taking it twice a day because of going to the bathroom so much."  Medication listed updated.

## 2018-05-24 ENCOUNTER — Ambulatory Visit (INDEPENDENT_AMBULATORY_CARE_PROVIDER_SITE_OTHER): Payer: Medicare HMO | Admitting: *Deleted

## 2018-05-24 ENCOUNTER — Other Ambulatory Visit: Payer: Self-pay

## 2018-05-24 ENCOUNTER — Ambulatory Visit (INDEPENDENT_AMBULATORY_CARE_PROVIDER_SITE_OTHER): Payer: Medicare HMO

## 2018-05-24 ENCOUNTER — Other Ambulatory Visit (INDEPENDENT_AMBULATORY_CARE_PROVIDER_SITE_OTHER): Payer: Medicare HMO

## 2018-05-24 VITALS — BP 138/78 | HR 66 | Wt 165.8 lb

## 2018-05-24 DIAGNOSIS — D649 Anemia, unspecified: Secondary | ICD-10-CM

## 2018-05-24 DIAGNOSIS — Z7901 Long term (current) use of anticoagulants: Secondary | ICD-10-CM

## 2018-05-24 DIAGNOSIS — I5032 Chronic diastolic (congestive) heart failure: Secondary | ICD-10-CM

## 2018-05-24 DIAGNOSIS — I2510 Atherosclerotic heart disease of native coronary artery without angina pectoris: Secondary | ICD-10-CM

## 2018-05-24 DIAGNOSIS — I10 Essential (primary) hypertension: Secondary | ICD-10-CM | POA: Diagnosis not present

## 2018-05-24 DIAGNOSIS — Z953 Presence of xenogenic heart valve: Secondary | ICD-10-CM | POA: Diagnosis not present

## 2018-05-24 DIAGNOSIS — I440 Atrioventricular block, first degree: Secondary | ICD-10-CM

## 2018-05-24 DIAGNOSIS — Z79899 Other long term (current) drug therapy: Secondary | ICD-10-CM | POA: Diagnosis not present

## 2018-05-24 LAB — POCT INR: INR: 2.9 (ref 2.0–3.0)

## 2018-05-24 MED ORDER — WARFARIN SODIUM 5 MG PO TABS: 5.0000 mg | ORAL_TABLET | Freq: Every day | ORAL | 1 refills | Status: DC

## 2018-05-24 NOTE — Patient Instructions (Signed)
Per Dr. Saunders Revel, please continue of 1/2 tablet daily until you see Dr. Roxy Manns on 05/29/18. It's been a pleasure seeing you every few weeks - I'll miss you and "Lasagna"!

## 2018-05-24 NOTE — Progress Notes (Signed)
1.) Reason for visit: BP check/lab work  2.) Name of MD requesting visit: Dr End  3.) H&P: HTN, s/p CABG  4.) ROS related to problem: Patient here for BP check and lab work today.  Today's BP 138/78, HR 66. No complaints from patient today. Patient brought BP/HR readings from home as well: 6/11 145/83, 64 6/12 142/70  6/13 130/70, 71 08:15am  134/84  11:15pm 6/14 147/84, 70 08:00am  126/69  10 pm 6/15 155/82, 70 08:15am 6/16 138/84, 59 11 am  124/81  10pm 6/17 138/75  10pm 6/18 151/78, 75 08:20am  144/86  1 pm 6/19 141/66  08:15 am   5.) Assessment and plan per MD: Advised patient to continue medications as this time and we will call him with results of labs and any further recommendations from Dr End.

## 2018-05-25 LAB — BASIC METABOLIC PANEL
BUN/Creatinine Ratio: 17 (ref 10–24)
BUN: 43 mg/dL — ABNORMAL HIGH (ref 8–27)
CO2: 22 mmol/L (ref 20–29)
Calcium: 8.9 mg/dL (ref 8.6–10.2)
Chloride: 104 mmol/L (ref 96–106)
Creatinine, Ser: 2.5 mg/dL — ABNORMAL HIGH (ref 0.76–1.27)
GFR calc Af Amer: 27 mL/min/{1.73_m2} — ABNORMAL LOW (ref >59)
GFR calc non Af Amer: 24 mL/min/{1.73_m2} — ABNORMAL LOW (ref >59)
Glucose: 91 mg/dL (ref 65–99)
Potassium: 3.6 mmol/L (ref 3.5–5.2)
Sodium: 145 mmol/L — ABNORMAL HIGH (ref 134–144)

## 2018-05-25 NOTE — Progress Notes (Signed)
Improved BP.  Labs show bump in creatinine; recommend holding torsemide with repeat BMP in 1 week.

## 2018-05-26 ENCOUNTER — Other Ambulatory Visit: Payer: Self-pay | Admitting: Thoracic Surgery (Cardiothoracic Vascular Surgery)

## 2018-05-26 DIAGNOSIS — Z951 Presence of aortocoronary bypass graft: Secondary | ICD-10-CM

## 2018-05-29 ENCOUNTER — Other Ambulatory Visit: Payer: Self-pay | Admitting: *Deleted

## 2018-05-29 ENCOUNTER — Ambulatory Visit: Payer: Medicare HMO | Admitting: Thoracic Surgery (Cardiothoracic Vascular Surgery)

## 2018-05-29 DIAGNOSIS — I10 Essential (primary) hypertension: Secondary | ICD-10-CM

## 2018-05-29 DIAGNOSIS — Z79899 Other long term (current) drug therapy: Secondary | ICD-10-CM

## 2018-05-29 DIAGNOSIS — I5032 Chronic diastolic (congestive) heart failure: Secondary | ICD-10-CM

## 2018-06-01 ENCOUNTER — Other Ambulatory Visit
Admission: RE | Admit: 2018-06-01 | Discharge: 2018-06-01 | Disposition: A | Discharge status: Home / Self Care | Payer: Medicare HMO | Source: Ambulatory Visit | Attending: Internal Medicine | Admitting: Internal Medicine

## 2018-06-01 ENCOUNTER — Ambulatory Visit
Admission: RE | Admit: 2018-06-01 | Discharge: 2018-06-01 | Disposition: A | Discharge status: Home / Self Care | Payer: Medicare HMO | Source: Ambulatory Visit | Attending: Thoracic Surgery (Cardiothoracic Vascular Surgery) | Admitting: Thoracic Surgery (Cardiothoracic Vascular Surgery)

## 2018-06-01 ENCOUNTER — Other Ambulatory Visit: Payer: Self-pay | Admitting: *Deleted

## 2018-06-01 ENCOUNTER — Ambulatory Visit: Payer: Medicare HMO | Admitting: Thoracic Surgery (Cardiothoracic Vascular Surgery)

## 2018-06-01 ENCOUNTER — Encounter: Payer: Self-pay | Admitting: Thoracic Surgery (Cardiothoracic Vascular Surgery)

## 2018-06-01 VITALS — BP 130/62 | HR 65 | Resp 20 | Ht 67.0 in | Wt 173.0 lb

## 2018-06-01 DIAGNOSIS — Z953 Presence of xenogenic heart valve: Secondary | ICD-10-CM | POA: Diagnosis not present

## 2018-06-01 DIAGNOSIS — R079 Chest pain, unspecified: Secondary | ICD-10-CM | POA: Diagnosis not present

## 2018-06-01 DIAGNOSIS — Z79899 Other long term (current) drug therapy: Secondary | ICD-10-CM | POA: Insufficient documentation

## 2018-06-01 DIAGNOSIS — I5032 Chronic diastolic (congestive) heart failure: Secondary | ICD-10-CM | POA: Diagnosis not present

## 2018-06-01 DIAGNOSIS — Z951 Presence of aortocoronary bypass graft: Secondary | ICD-10-CM | POA: Diagnosis not present

## 2018-06-01 DIAGNOSIS — I10 Essential (primary) hypertension: Secondary | ICD-10-CM | POA: Insufficient documentation

## 2018-06-01 DIAGNOSIS — Z9889 Other specified postprocedural states: Secondary | ICD-10-CM | POA: Diagnosis not present

## 2018-06-01 DIAGNOSIS — Z8679 Personal history of other diseases of the circulatory system: Secondary | ICD-10-CM

## 2018-06-01 LAB — BASIC METABOLIC PANEL
Anion gap: 9 (ref 5–15)
BUN: 33 mg/dL — ABNORMAL HIGH (ref 8–23)
CO2: 23 mmol/L (ref 22–32)
Calcium: 8.5 mg/dL — ABNORMAL LOW (ref 8.9–10.3)
Chloride: 107 mmol/L (ref 98–111)
Creatinine, Ser: 2.14 mg/dL — ABNORMAL HIGH (ref 0.61–1.24)
GFR calc Af Amer: 32 mL/min — ABNORMAL LOW (ref >60)
GFR calc non Af Amer: 28 mL/min — ABNORMAL LOW (ref >60)
Glucose, Bld: 107 mg/dL — ABNORMAL HIGH (ref 70–99)
Potassium: 3.5 mmol/L (ref 3.5–5.1)
Sodium: 139 mmol/L (ref 135–145)

## 2018-06-01 MED ORDER — TORSEMIDE 20 MG PO TABS: 20.0000 mg | ORAL_TABLET | Freq: Every day | ORAL | 3 refills | Status: DC | PRN

## 2018-06-01 NOTE — Progress Notes (Signed)
[address]       [name] [address]             [phone]     CARDIOTHORACIC SURGERY OFFICE NOTE  Referring Provider is End, Harrell Gave, MD PCP is Remi Haggard, FNP   HPI:  Patient is a 82 year old male with history of aortic insufficiency and ascending thoracic aortic aneurysm, hypertension, and chronic kidney diseasewho returns to the office today for routine follow-up status post biological Bentall aortic root replacement using a stented bovine pericardial tissue valve with synthetic aortic root graft  with reimplantation of left main coronary artery, repair of ascending thoracic aortic aneurysm, and coronary artery bypass grafting x3 on February 22, 2018.  His postoperative recovery in the hospital was notable for the development of postoperative atrial fibrillation for which he was treated with amiodarone and anticoagulated using warfarin.  He was ultimately discharged to a local skilled nursing facility on the ninth postoperative day.  He was last seen here in our office on March 27, 2018 at which time he was doing well.  His renal function and thrombocytopenia have stabilized.  He has remained in sinus rhythm and without any signs or symptoms suggestive of recurrent atrial fibrillation since hospital discharge.  Follow-up echocardiogram performed Apr 07, 2018 revealed improved left ventricular systolic function with normal functioning bioprosthetic tissue valve in aortic position.  Left ventricular ejection fraction was estimated 50 to 55%.  He was seen in follow-up by Dr. Saunders Revel on May 16, 2018 and he returns to our office today for routine follow-up.  He reports that he is doing quite well.  He has not yet enrolled in cardiac rehab program.  He has been experiencing some mild bilateral lower extremity edema and tendency for fluid retention.  He has been going off and on diuretics, although he has been off all diuretics for the past week.  He denies any shortness  of breath or chest discomfort.  Activity level is good.  Overall he feels well and has no complaints.   Current Outpatient Medications  Medication Sig Dispense Refill  . amiodarone (PACERONE) 200 MG tablet Take 1 tablet (200 mg total) by mouth daily. 90 tablet 3  . amLODipine (NORVASC) 10 MG tablet Take 10 mg by mouth daily.    [name] Kitchen atorvastatin (LIPITOR) 20 MG tablet Take 1 tablet (20 mg total) by mouth daily. 90 tablet 2  . carvedilol (COREG) 3.125 MG tablet Take 1 tablet (3.125 mg total) by mouth 2 (two) times daily. 180 tablet 3  . Cholecalciferol (VITAMIN D3) 2000 units capsule Take 2,000 Units by mouth daily.     [name] Kitchen doxazosin (CARDURA) 2 MG tablet Take 2 mg by mouth daily.    . ferrous sulfate 325 (65 FE) MG EC tablet Take 325 mg by mouth 2 (two) times daily.     . fluticasone-salmeterol (ADVAIR HFA) 115-21 MCG/ACT inhaler Inhale 2 puffs into the lungs 2 (two) times daily. Use with spacer/ Aerochamber    . folic acid-pyridoxine-cyancobalamin (FOLTX) 2.5-25-2 MG TABS tablet Take 1 tablet by mouth daily. 30 each   . sennosides-docusate sodium (SENOKOT-S) 8.6-50 MG tablet Take 1 tablet by mouth 2 (two) times daily.     [name] Kitchen torsemide (DEMADEX) 20 MG tablet Take 1 tablet (20 mg total) by mouth daily as needed. 180 tablet 3  . vitamin C (ASCORBIC ACID) 500 MG tablet Take 500 mg by mouth 2 (two) times daily.    [name] Kitchen warfarin (COUMADIN)  5 MG tablet Take 1 tablet (5 mg total) by mouth daily. (Patient not taking: Reported on 06/01/2018) 5 tablet 1   No current facility-administered medications for this visit.       Physical Exam:   BP 130/62   Pulse 65   Resp 20   Ht 5\' 7"  (1.702 m)   Wt 173 lb (78.5 kg)   SpO2 98% Comment: RA  BMI 27.10 kg/m   General:  Well-appearing  Chest:   Clear to auscultation  CV:   Regular rate and rhythm with soft systolic murmur right sternal border  Incisions:  Completely healed, sternum is stable  Abdomen:  Soft nontender  Extremities:  Warm and well-perfused,  mild bilateral lower extremity edema, R>L  Diagnostic Tests:  Transthoracic Echocardiography  (Report amended )  Patient:    Rodney Wiley, Rodney Wiley MR #:       [id number] Study Date: 04/07/2018 Gender:     M Age:        63 Height:     170.2 cm Weight:     73.4 kg BSA:        1.87 m^2 Pt. Status: Room:   ATTENDING    Murray Hodgkins, MD  ORDERING     Murray Hodgkins, MD  Shelley, MD  PERFORMING   [place], [place]  SONOGRAPHER  Pilar Jarvis, RVT, RDCS, RDMS  cc:  ------------------------------------------------------------------- LV EF: 50% -   55%  ------------------------------------------------------------------- History:   PMH:  02/22/18: S/P biological Bentall aortic root replacement, synthetic root conduit, Edwards Inspiris Resilia stented bovine AVR and, CABG X 3.  Coronary artery disease. Congestive heart failure.  Mitral valve disease.  Risk factors: Hypertension. Dyslipidemia.  ------------------------------------------------------------------- Study Conclusions  - Left ventricle: The cavity size was normal. There was mild   concentric hypertrophy. Systolic function was normal. The   estimated ejection fraction was in the range of 50% to 55%.   Anteroseptal and Septal wall hypokinesis, possibly consistent   with post-operative state or conduction abnormality. Features are   consistent with a pseudonormal left ventricular filling pattern,   with concomitant abnormal relaxation and increased filling   pressure (grade 2 diastolic dysfunction). - Aortic valve: A bioprosthesis was present. Peak velocity (S): 271   cm/s. Mean gradient (S): 17 mm Hg. - Aortic root: The aortic root was normal in size. - Ascending aorta: The ascending aorta was normal in size. - Mitral valve: There was mild regurgitation. - Left atrium: The atrium was mildly dilated. - Right ventricle: Systolic function was normal. - Pulmonary arteries: Systolic pressure  was within the normal   range.  ------------------------------------------------------------------- Labs, prior tests, procedures, and surgery: ECG.     Abnormal.  ------------------------------------------------------------------- Study data:  The previous study was not available, so comparison was made to the report of January 2018.  Study status:  Routine. Procedure:  Transthoracic echocardiography. Image quality was fair. Intravenous contrast (Definity) was administered.  Study completion:  There were no complications.          Transthoracic echocardiography.  M-mode, complete 2D, spectral Doppler, and color Doppler.  Birthdate:  Patient birthdate: 02/23/1936.  Age:  Patient is 82 yr old.  Sex:  Gender: male.    BMI: 25.3 kg/m^2.  Blood pressure:     118/66  Patient status:  Outpatient.  Study date: Study date: 04/07/2018. Study time: 12:32 PM.  -------------------------------------------------------------------  ------------------------------------------------------------------- Left ventricle:  The cavity size was normal. There was mild concentric hypertrophy. Systolic function was  normal. The estimated ejection fraction was in the range of 50% to 55%. Anteroseptal and Septal wall hypokinesis, possibly consistent with post-operative state or conduction abnormality. Features are consistent with a pseudonormal left ventricular filling pattern, with concomitant abnormal relaxation and increased filling pressure (grade 2 diastolic dysfunction).  ------------------------------------------------------------------- Aortic valve:  Poorly visualized.  Normal thickness leaflets. A bioprosthesis was present. Mobility was not restricted.  Doppler: Transvalvular velocity was within the normal range. There was no stenosis. There was no regurgitation.    VTI ratio of LVOT to aortic valve: 0.36. Valve area (VTI): 1.03 cm^2. Indexed valve area (VTI): 0.55 cm^2/m^2. Mean velocity ratio  of LVOT to aortic valve: 0.36. Valve area (Vmean): 1.02 cm^2. Indexed valve area (Vmean): 0.55 cm^2/m^2.    Mean gradient (S): 17 mm Hg. Peak gradient (S): 29 mm Hg.  ------------------------------------------------------------------- Aorta:  Aortic root: The aortic root was normal in size. Ascending aorta: The ascending aorta was normal in size.  ------------------------------------------------------------------- Mitral valve:   Structurally normal valve.   Mobility was not restricted.  Doppler:  Transvalvular velocity was within the normal range. There was no evidence for stenosis. There was mild regurgitation.    Valve area by pressure half-time: 2.68 cm^2. Indexed valve area by pressure half-time: 1.43 cm^2/m^2. Valve area by continuity equation (using LVOT flow): 1.53 cm^2. Indexed valve area by continuity equation (using LVOT flow): 0.82 cm^2/m^2. Mean gradient (D): 3 mm Hg. Peak gradient (D): 4 mm Hg.  ------------------------------------------------------------------- Left atrium:  The atrium was mildly dilated.  ------------------------------------------------------------------- Right ventricle:  The cavity size was normal. Wall thickness was normal. Systolic function was normal.  ------------------------------------------------------------------- Pulmonic valve:    Structurally normal valve.   Cusp separation was normal.  Doppler:  Transvalvular velocity was within the normal range. There was no evidence for stenosis. There was no regurgitation.  ------------------------------------------------------------------- Tricuspid valve:   Structurally normal valve.    Doppler: Transvalvular velocity was within the normal range. There was mild regurgitation.  ------------------------------------------------------------------- Pulmonary artery:   The main pulmonary artery was normal-sized. Systolic pressure was within the normal  range.  ------------------------------------------------------------------- Right atrium:  The atrium was normal in size.  ------------------------------------------------------------------- Pericardium:  There was no pericardial effusion.  ------------------------------------------------------------------- Systemic veins: Inferior vena cava: The vessel was normal in size.  ------------------------------------------------------------------- Measurements   Left ventricle                           Value          Reference  LV ID, ED, PLAX chordal                  50    mm       43 - 52  LV ID, ES, PLAX chordal                  32    mm       23 - 38  LV fx shortening, PLAX chordal           36    %        >=29  LV PW thickness, ED                      11    mm       ----------  IVS/LV PW ratio, ED  1              <=1.3  Stroke volume, 2D                        60    ml       ----------  Stroke volume/bsa, 2D                    32    ml/m^2   ----------  LV ejection fraction, 1-p A4C            52    %        ----------  LV end-diastolic volume, 2-p             91    ml       ----------  LV end-systolic volume, 2-p              48    ml       ----------  LV ejection fraction, 2-p                48    %        ----------  Stroke volume, 2-p                       44    ml       ----------  LV end-diastolic volume/bsa, 2-p         49    ml/m^2   ----------  LV end-systolic volume/bsa, 2-p          25    ml/m^2   ----------  Stroke volume/bsa, 2-p                   23.3  ml/m^2   ----------  LV e&', lateral                           7.94  cm/s     ----------  LV E/e&', lateral                         12.09          ----------  LV e&', medial                            4.68  cm/s     ----------  LV E/e&', medial                          20.51          ----------  LV e&', average                           6.31  cm/s     ----------  LV E/e&', average                          15.21          ----------    Ventricular septum                       Value          Reference  IVS thickness, ED  11    mm       ----------    LVOT                                     Value          Reference  LVOT ID, S                               19    mm       ----------  LVOT area                                2.84  cm^2     ----------  LVOT ID                                  19    mm       ----------  LVOT mean velocity, S                    69.8  cm/s     ----------  LVOT VTI, S                              21.1  cm       ----------  Stroke volume (SV), LVOT DP              59.8  ml       ----------  Stroke index (SV/bsa), LVOT DP           31.9  ml/m^2   ----------    Aortic valve                             Value          Reference  Aortic valve peak velocity, S            271   cm/s     ----------  Aortic valve mean velocity, S            194   cm/s     ----------  Aortic valve VTI, S                      58    cm       ----------  Aortic mean gradient, S                  17    mm Hg    ----------  Aortic peak gradient, S                  29    mm Hg    ----------  VTI ratio, LVOT/AV                       0.36           ----------  Aortic valve area, VTI                   1.03  cm^2     ----------  Aortic valve area/bsa, VTI  0.55  cm^2/m^2 ----------  Velocity ratio, mean, LVOT/AV            0.36           ----------  Aortic valve area, mean velocity         1.02  cm^2     ----------  Aortic valve area/bsa, mean              0.55  cm^2/m^2 ----------  velocity    Aorta                                    Value          Reference  Aortic root ID, ED                       30    mm       ----------    Left atrium                              Value          Reference  LA ID, A-P, ES                           34    mm       ----------  LA ID/bsa, A-P                           1.81  cm/m^2   <=2.2  LA volume, S                              91.7  ml       ----------  LA volume/bsa, S                         49    ml/m^2   ----------  LA volume, ES, 1-p A4C                   68.8  ml       ----------  LA volume/bsa, ES, 1-p A4C               36.7  ml/m^2   ----------  LA volume, ES, 1-p A2C                   104   ml       ----------  LA volume/bsa, ES, 1-p A2C               55.5  ml/m^2   ----------    Mitral valve                             Value          Reference  Mitral E-wave peak velocity              96    cm/s     ----------  Mitral A-wave peak velocity              92.6  cm/s     ----------  Mitral mean velocity, D  73.5  cm/s     ----------  Mitral deceleration time         (H)     261   ms       150 - 230  Mitral pressure half-time                76    ms       ----------  Mitral mean gradient, D                  3     mm Hg    ----------  Mitral peak gradient, D                  4     mm Hg    ----------  Mitral E/A ratio, peak                   1              ----------  Mitral valve area, PHT, DP               2.68  cm^2     ----------  Mitral valve area/bsa, PHT, DP           1.43  cm^2/m^2 ----------  Mitral valve area, LVOT                  1.53  cm^2     ----------  continuity  Mitral valve area/bsa, LVOT              0.82  cm^2/m^2 ----------  continuity  Mitral annulus VTI, D                    39.1  cm       ----------    Tricuspid valve                          Value          Reference  Tricuspid regurg peak velocity           242   cm/s     ----------  Tricuspid peak RV-RA gradient            23    mm Hg    ----------    Right atrium                             Value          Reference  RA ID, S-I, ES, A4C              (H)     55.4  mm       34 - 49  RA area, ES, A4C                         13.5  cm^2     8.3 - 19.5  RA volume, ES, A/L                       26.1  ml       ----------  RA volume/bsa, ES, A/L                   13.9  ml/m^2   ----------    Right ventricle  Value          Reference  TAPSE                                    14.7  mm       ----------  RV s&', lateral, S                        6.09  cm/s     ----------    Pulmonic valve                           Value          Reference  Pulmonic valve peak velocity, S          88.3  cm/s     ----------  Legend: (L)  and  (H)  mark values outside specified reference range.  ------------------------------------------------------------------- Wyline Mood, MD, [facility] 2019-05-04T17:26:24       CHEST - 2 VIEW  COMPARISON:  Radiographs of March 27, 2018.  FINDINGS: The heart size and mediastinal contours are within normal limits. No pneumothorax or pleural effusion is noted. Status post coronary artery bypass graft and aortic valve replacement. Both lungs are clear. The visualized skeletal structures are unremarkable.  IMPRESSION: No active cardiopulmonary disease.   Electronically Signed   By: Marijo Conception, M.D.   On: 06/01/2018 14:58    Impression:  Patient is doing very well approximately 3 months status post biological Bentall aortic root replacement.  His activity level and exercise tolerance seems reasonable although I feel that he would benefit from participation in the cardiac rehab program.  He has been maintaining sinus rhythm but remains on both amiodarone and warfarin.  He has had some lower extremity edema and has been off and on diuretic therapy.  His renal function has stabilized although reportedly it goes up and down depending on whether or not he is taking diuretics.  His thrombocytopenia is stable.  Plan:  I have instructed the patient to stop taking amiodarone.  I think he could probably stop taking warfarin in the near future if he continues to maintain sinus rhythm.  We have not recommended any other changes to his current medications.  I have encouraged the patient to continue to increase his physical activity without any  particular limitations and I have specifically encouraged him to enroll and participate in the cardiac rehab program.  The patient has been reminded regarding the importance of dental hygiene and the lifelong need for antibiotic prophylaxis for all dental cleanings and other related invasive procedures.  The patient will continue to follow-up with Dr. Saunders Revel and return to our office for routine follow-up next March, approximately 1 year following his surgery.  He will call and return sooner should specific problems or questions arise.   I spent in excess of 15 minutes during the conduct of this office consultation and >50% of this time involved direct face-to-face encounter with the patient for counseling and/or coordination of their care.    Valentina Gu. Roxy Manns, MD 06/01/2018 3:24 PM

## 2018-06-01 NOTE — Patient Instructions (Signed)
Stop taking amiodarone.  Continue all other medications without any changes at this time.  Check your weight on a regular basis and keep a log for your records.  Look for signs of fluid overload such as worsening swelling of your lower legs, increased shortness of breath with activity, and/or a dry nonproductive cough.  Discussed these findings with your cardiologist including whether or not you should adjust your fluid pill dosage (diuretic).  You may resume unrestricted physical activity without any particular limitations at this time.  You are encouraged to enroll and participate in the outpatient cardiac rehab program beginning as soon as practical.  Endocarditis is a potentially serious infection of heart valves or inside lining of the heart.  It occurs more commonly in patients with diseased heart valves (such as patient's with aortic or mitral valve disease) and in patients who have undergone heart valve repair or replacement.  Certain surgical and dental procedures may put you at risk, such as dental cleaning, other dental procedures, or any surgery involving the respiratory, urinary, gastrointestinal tract, gallbladder or prostate gland.   To minimize your chances for develooping endocarditis, maintain good oral health and seek prompt medical attention for any infections involving the mouth, teeth, gums, skin or urinary tract.    Always notify your doctor or dentist about your underlying heart valve condition before having any invasive procedures. You will need to take antibiotics before certain procedures, including all routine dental cleanings or other dental procedures.  Your cardiologist or dentist should prescribe these antibiotics for you to be taken ahead of time.

## 2018-06-02 ENCOUNTER — Other Ambulatory Visit: Payer: Self-pay

## 2018-06-02 NOTE — Progress Notes (Signed)
ef

## 2018-06-09 ENCOUNTER — Telehealth: Payer: Self-pay | Admitting: Internal Medicine

## 2018-06-09 MED ORDER — ATORVASTATIN CALCIUM 20 MG PO TABS: 20.0000 mg | ORAL_TABLET | Freq: Every day | ORAL | 3 refills | Status: DC

## 2018-06-09 NOTE — Telephone Encounter (Signed)
°*  STAT* If patient is at the pharmacy, call can be transferred to refill team.   1. Which medications need to be refilled? (please list name of each medication and dose if known) atorvastatin 20 mg   2. Which pharmacy/location (including street and city if local pharmacy) is medication to be sent to? [place]   3. Do they need a 30 day or 90 day supply? 30 day

## 2018-06-21 ENCOUNTER — Ambulatory Visit: Payer: Medicare HMO | Admitting: Nurse Practitioner

## 2018-06-21 ENCOUNTER — Encounter: Payer: Self-pay | Admitting: Nurse Practitioner

## 2018-06-21 VITALS — BP 128/60 | HR 60 | Ht 67.0 in | Wt 170.5 lb

## 2018-06-21 DIAGNOSIS — N183 Chronic kidney disease, stage 3 unspecified: Secondary | ICD-10-CM

## 2018-06-21 DIAGNOSIS — Z9889 Other specified postprocedural states: Secondary | ICD-10-CM

## 2018-06-21 DIAGNOSIS — I10 Essential (primary) hypertension: Secondary | ICD-10-CM

## 2018-06-21 DIAGNOSIS — Z8679 Personal history of other diseases of the circulatory system: Secondary | ICD-10-CM

## 2018-06-21 DIAGNOSIS — I2510 Atherosclerotic heart disease of native coronary artery without angina pectoris: Secondary | ICD-10-CM | POA: Diagnosis not present

## 2018-06-21 DIAGNOSIS — E785 Hyperlipidemia, unspecified: Secondary | ICD-10-CM | POA: Diagnosis not present

## 2018-06-21 DIAGNOSIS — I351 Nonrheumatic aortic (valve) insufficiency: Secondary | ICD-10-CM

## 2018-06-21 DIAGNOSIS — I480 Paroxysmal atrial fibrillation: Secondary | ICD-10-CM | POA: Diagnosis not present

## 2018-06-21 DIAGNOSIS — I5032 Chronic diastolic (congestive) heart failure: Secondary | ICD-10-CM

## 2018-06-21 MED ORDER — CARVEDILOL 6.25 MG PO TABS: 6.2500 mg | ORAL_TABLET | Freq: Two times a day (BID) | ORAL | 3 refills | Status: DC

## 2018-06-21 NOTE — Patient Instructions (Signed)
Medication Instructions:  Your physician has recommended you make the following change in your medication:  1. INCREASE Carvedilol to 6.25 mg twice daily   Follow-Up: Your physician recommends that you schedule a follow-up appointment in: 3 months with Dr. Saunders Revel.  You have been referred to Cardiopulmonary rehab. They should call you in the next couple of weeks to update you. If not you can reach out to them at [phone]  It was a pleasure seeing you today here in the office. Please do not hesitate to give [place] a call back if you have any further questions. [place], BSN

## 2018-06-21 NOTE — Progress Notes (Signed)
Office Visit    Patient Name: LORRY ANASTASI Date of Encounter: 06/21/2018  Primary Care Provider:  Remi Haggard, FNP Primary Cardiologist:  Nelva Bush, MD  Chief Complaint    82 y/o ? with a history of aortic insufficiency, ascending aortic aneurysm, stage III chronic kidney disease, hypertension, hyperlipidemia, left renal artery stenosis, and coronary artery disease status post biological Bentall aortic root replacement with bioprosthetic aortic valve, resuspension of the left main, and CABG x3 in March 2019, who presents for follow-up.  Past Medical History    Past Medical History:  Diagnosis Date  . Aortic insufficiency    a. 02/2018 s/p bioprosthetic AVR; 04/2018 Echo: EF 50-55%, Gr2 DD, Ao bioprosthesis, mean grad 46mmHg, Ao root/Asc Ao nl in size, mild MR, mildly dil LA, nl RV fxn. Nl PASP.  [name] Kitchen Arthropathy   . Ascending aortic aneurysm ([place])    a. 12/2016 MRA: 4.3cm @ sinus of valsalva, 4.9cm above sinotubular jxn; b. 02/2018 s/p biological Bentall and AVR.  [name] Kitchen BPH (benign prostatic hyperplasia)   . 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 02/2018 Cath: LAD 20ost, 40p, 53m, LCX 60p, OM2 60, OM3 85, RCA 10p/m w/ L->R and R->R collats;  b. 02/2018 CABG x 3: LIMA to LAD, SVG to OM2, SVG to PDA, EVH via right thigh.  . CKD (chronic kidney disease), stage III ([place])   . Diverticulitis   . Essential hypertension   . GERD (gastroesophageal reflux disease)   . Hemorrhoids   . History of chicken pox   . History of Helicobacter pylori infection 06/2007  . Hyperlipidemia   . Hypertension   . Left renal artery stenosis ([facility])    a. 01/2017 RA u/s: moderate L RAS.  [name] Kitchen Lower extremity edema   . Nonrheumatic mitral (valve) insufficiency    a. 12/2016 Echo: mild to mod MR; b. 09/2017 TEE: mild to mod MR; c. 04/2018 Echo: Mild MR.  [name] Kitchen Nonrheumatic pulmonary valve insufficiency   . 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 s/p 21 mm Edwards Inspiris Resilia stented bovine pericardial tissue valve and 24 mm Gelweave Valsalva synthetic root conduit with reimplantation of left main coronary artery.   Past Surgical History:  Procedure Laterality Date  . AORTIC VALVE REPAIR N/A 02/22/2018   Procedure: AORTIC VALVE REPLACEMENT -Biological Bentall aortic root replacement,;  Surgeon: Rexene Alberts, MD;  Location: [facility];  Service: Open Heart Surgery;  Laterality: N/A;  . bypass surgery    . CARDIAC CATHETERIZATION     01/16/2018  . COLONOSCOPY  2011   by Dr. Candace Cruise with findings of diverticulosis  . CORONARY ARTERY BYPASS GRAFT N/A 02/22/2018   Procedure: CORONARY ARTERY BYPASS GRAFTING (CABG) x three, using left internal mammary artery and right leg greater saphenous vein harvested endoscopically;  Surgeon: Rexene Alberts, MD;  Location: [facility];  Service: Open Heart Surgery;  Laterality: N/A;  . CORONARY/GRAFT ANGIOGRAPHY N/A 01/16/2018   Procedure: CORONARY/GRAFT ANGIOGRAPHY;  Surgeon: Nelva Bush, MD;  Location: [facility];  Service: Cardiovascular;  Laterality: N/A;  . ELBOW SURGERY Left   . hemorhoidectomy    . RIGHT HEART CATH N/A 01/16/2018   Procedure: RIGHT HEART CATH;  Surgeon: Nelva Bush, MD;  Location: [facility];  Service: Cardiovascular;  Laterality: N/A;  . SHOULDER ARTHROSCOPY WITH ROTATOR CUFF REPAIR Right   . SHOULDER SURGERY Right   . TEE WITHOUT CARDIOVERSION N/A  09/27/2017   Procedure: TRANSESOPHAGEAL ECHOCARDIOGRAM (TEE);  Surgeon: Dorothy Spark, MD;  Location[facility] ENDOSCOPY;  Service: Cardiovascular;  Laterality: N/A;  . TEE WITHOUT CARDIOVERSION N/A 02/22/2018   Procedure: TRANSESOPHAGEAL ECHOCARDIOGRAM (TEE);  Surgeon: Rexene Alberts, MD;  Location: [facility];  Service: Open Heart Surgery;  Laterality: N/A;  . THORACIC AORTIC ANEURYSM REPAIR N/A 02/22/2018   Procedure: THORACIC ASCENDING ANEURYSM REPAIR (AAA) Resection of ascending aorta aneurysm;  Surgeon: Rexene Alberts, MD;  Location: [facility] OR;  Service: Open Heart Surgery;  Laterality: N/A;    Allergies  No Known Allergies  History of Present Illness    82 year old male with the above complex past medical history including aortic insufficiency, ascending aortic aneurysm, CAD, stage III chronic kidney disease, hypertension, hyperlipidemia, and left renal artery stenosis.  Earlier this year, he underwent diagnostic catheterization which revealed multivessel coronary disease in the setting of aortic valve and aortic disease, on March 20, he underwent biological Bentall with aortic root replacement, bioprosthetic aortic valve, CABG x3, and reimplantation of the left main coronary artery.  Postop course was comp gated by anemia requiring transfusion along with paroxysmal atrial fibrillation requiring amiodarone and Coumadin, and mild worsening of renal failure.  He required a short stay in rehab following hospital discharge.  He was seen in late April in the setting of low blood pressures.  Torsemide and beta-blocker therapy were held.  Blood pressures rose following that clinic visit and carvedilol was resumed.  When he was seen in clinic in June, he had noticed increased right greater than left lower extremity swelling with weight gain.  Torsemide 20 mg daily was started and subsequently cut back to as needed.  He has been using torsemide about once a week.  He recently followed up with CT surgery and amiodarone was discontinued.  It was felt that Coumadin could likely be discontinued in the near future and as result, patient discontinued shortly after that visit.  He had been having significant bruising.  He has done well since his last visit with CT surgery.  He still has mild right rather than left lower extremity swelling which is most noticeable later in the day.  He has not been wearing compression stockings and says it is not particularly bothersome.  His weight has been stable.  He denies chest pain,  dyspnea, palpitations, PND, orthopnea, dizziness, syncope, or early satiety.  He is eager to start cardiac rehab but has not heard from them recently.  Home Medications    Prior to Admission medications   Medication Sig Start Date End Date Taking? Authorizing Provider  amLODipine (NORVASC) 10 MG tablet Take 10 mg by mouth daily.    [provider]  atorvastatin (LIPITOR) 20 MG tablet Take 1 tablet (20 mg total) by mouth daily. 06/09/18   End, Harrell Gave, MD  carvedilol (COREG) 3.125 MG tablet Take 1 tablet (3.125 mg total) by mouth 2 (two) times daily. 05/03/18 08/01/18  End, Harrell Gave, MD  Cholecalciferol (VITAMIN D3) 2000 units capsule Take 2,000 Units by mouth daily.     [provider]  doxazosin (CARDURA) 2 MG tablet Take 2 mg by mouth daily.    [provider]  ferrous sulfate 325 (65 FE) MG EC tablet Take 325 mg by mouth 2 (two) times daily.     [provider]  fluticasone-salmeterol (ADVAIR HFA) 115-21 MCG/ACT inhaler Inhale 2 puffs into the lungs 2 (two) times daily. Use with spacer/ Aerochamber    [provider]  folic acid-pyridoxine-cyancobalamin (FOLTX) 2.5-25-2 MG TABS tablet Take 1 tablet by mouth daily. 03/04/18   Gold, Wayne E, PA-C  sennosides-docusate sodium (SENOKOT-S) 8.6-50 MG tablet Take 1 tablet by mouth 2 (two) times daily.     [provider]  torsemide (DEMADEX) 20 MG tablet Take 1 tablet (20 mg total) by mouth daily as needed. 06/01/18 08/30/18  End, Harrell Gave, MD  vitamin C (ASCORBIC ACID) 500 MG tablet Take 500 mg by mouth 2 (two) times daily.    [provider]  warfarin (COUMADIN) 5 MG tablet Take 1 tablet (5 mg total) by mouth daily. Patient not taking: Reported on 06/01/2018 05/24/18   End, Harrell Gave, MD    Review of Systems    As above, mild right ankle swelling since surgery.  He denies chest pain, palpitations, dyspnea, pnd, orthopnea, n, v, dizziness, syncope, weight gain, or early satiety.   All other systems reviewed and are otherwise negative except as noted above.  Physical Exam    VS:  BP 128/60 (BP Location: Left Arm, Patient Position: Sitting, Cuff Size: Normal)   Pulse 60   Ht 5\' 7"  (1.702 m)   Wt 170 lb 8 oz (77.3 kg)   BMI 26.70 kg/m  , BMI Body mass index is 26.7 kg/m. GEN: Well nourished, well developed, in no acute distress.  HEENT: normal.  Neck: Supple, no JVD, carotid bruits, or masses. Cardiac: RRR, 2/6 systolic ejection murmur at the upper sternal borders but heard throughout, no rubs, or gallops. No clubbing, cyanosis, 1+ right ankle edema.  Radials/DP/PT 2+ and equal bilaterally.  Respiratory:  Respirations regular and unlabored, clear to auscultation bilaterally. GI: Soft, nontender, nondistended, BS + x 4. MS: no deformity or atrophy. Skin: warm and dry, no rash. Neuro:  Strength and sensation are intact. Psych: Normal affect.  Accessory Clinical Findings    ECG -regular sinus rhythm, 60, first-degree AV block, left axis deviation, delayed R wave progression, no acute changes.  Assessment & Plan    1.  Coronary artery disease: Status post CABG x3 in March in the setting of Bentall.  He has been doing well without chest pain.  He is interested in cardiac rehab and we will resend a referral.  He remains on statin and beta-blocker.  Resume aspirin 81 mg daily now that he is off of Coumadin.  2.  Aortic insufficiency and thoracic aortic aneurysm status post repair: Now 4 months postoperative.  Recently seen by CT surgery.  Echo in May showed normally functioning bioprosthetic valve.  Cardiac rehab as above.  3.  Paroxysmal atrial fibrillation: This occurred postoperatively.  Amiodarone was recently discontinued and patient discontinued warfarin on his own just a few weeks ago.  He has not had any palpitations and is in sinus rhythm today.  Continue beta-blocker.  He knows to contact us if he has any palpitations.  4.  Chronic diastolic congestive heart  failure: Weight has been stable.  He has mild right lower extremity swelling.  This is where he had vein harvest.  Overall euvolemic on exam.  Blood pressure stable today however he brings a list of blood pressures from home showing pressures typically running in the 140s to 150s.  Titrating beta-blocker.  He has only been using torsemide about once a week.  5.  Essential hypertension: As above, blood pressures running in the 140s to 150s at home.  Heart rates typically in the mid 60s to 70s.  I am going to increase his carvedilol to 6.25 mg  twice daily.  I have asked him to continue to follow his blood pressure and heart rates at home and contact us if heart rates dropped below 50.  Renal function relatively stable June 27.  6.  Stage III-IV chronic kidney disease: Creatinine 2.14 on June 27.  As above, only using torsemide about once a week or less.  7.  HL:  Cont statin rx.  LDL 39 in April.  8.  Disposition: Follow-up in clinic in 3 months or sooner if necessary.  Sending referral to cardiac rehab today.     Murray Hodgkins, NP 06/21/2018, 12:30 PM

## 2018-06-28 ENCOUNTER — Ambulatory Visit: Payer: Medicare HMO | Admitting: Physician Assistant

## 2018-06-29 ENCOUNTER — Ambulatory Visit: Payer: Medicare HMO

## 2018-07-06 ENCOUNTER — Encounter: Payer: Self-pay | Admitting: *Deleted

## 2018-07-06 ENCOUNTER — Encounter: Payer: Medicare HMO | Attending: Nurse Practitioner | Admitting: *Deleted

## 2018-07-06 VITALS — Ht 66.5 in | Wt 172.8 lb

## 2018-07-06 DIAGNOSIS — Z951 Presence of aortocoronary bypass graft: Secondary | ICD-10-CM | POA: Insufficient documentation

## 2018-07-06 DIAGNOSIS — Z952 Presence of prosthetic heart valve: Secondary | ICD-10-CM | POA: Insufficient documentation

## 2018-07-06 NOTE — Progress Notes (Signed)
Cardiac Individual Treatment Plan  Patient Details  Name: Rodney Wiley MRN: [id number] Date of Birth: Oct 27, 1936 Referring Provider:     Cardiac Rehab from 07/06/2018 in [facility] Cardiac and Pulmonary Rehab  Referring Provider  [name]      Initial Encounter Date:    Cardiac Rehab from 07/06/2018 in [facility] Cardiac and Pulmonary Rehab  Date  07/06/18      Visit Diagnosis: S/P CABG x 3  Patient's Home Medications on Admission:  Current Outpatient Medications:  .  amLODipine (NORVASC) 10 MG tablet, Take 10 mg by mouth daily., Disp: , Rfl:  .  atorvastatin (LIPITOR) 20 MG tablet, Take 1 tablet (20 mg total) by mouth daily., Disp: 90 tablet, Rfl: 3 .  carvedilol (COREG) 6.25 MG tablet, Take 1 tablet (6.25 mg total) by mouth 2 (two) times daily., Disp: 180 tablet, Rfl: 3 .  Cholecalciferol (VITAMIN D3) 2000 units capsule, Take 2,000 Units by mouth daily. , Disp: , Rfl:  .  doxazosin (CARDURA) 2 MG tablet, Take 2 mg by mouth daily., Disp: , Rfl:  .  ferrous sulfate 325 (65 FE) MG EC tablet, Take 325 mg by mouth 2 (two) times daily. , Disp: , Rfl:  .  fluticasone-salmeterol (ADVAIR HFA) 115-21 MCG/ACT inhaler, Inhale 2 puffs into the lungs as needed. Use with spacer/ Aerochamber , Disp: , Rfl:  .  folic acid-pyridoxine-cyancobalamin (FOLTX) 2.5-25-2 MG TABS tablet, Take 1 tablet by mouth daily., Disp: 30 each, Rfl:  .  sennosides-docusate sodium (SENOKOT-S) 8.6-50 MG tablet, Take 1 tablet by mouth 2 (two) times daily. , Disp: , Rfl:  .  torsemide (DEMADEX) 20 MG tablet, Take 1 tablet (20 mg total) by mouth daily as needed., Disp: 180 tablet, Rfl: 3 .  vitamin C (ASCORBIC ACID) 500 MG tablet, Take 500 mg by mouth 2 (two) times daily., Disp: , Rfl:   Past Medical History: Past Medical History:  Diagnosis Date  . Aortic insufficiency    a. 02/2018 s/p bioprosthetic AVR; 04/2018 Echo: EF 50-55%, Gr2 DD, Ao bioprosthesis, mean grad 93mHg, Ao root/Asc Ao nl in size, mild MR, mildly dil LA, nl RV fxn. Nl  PASP.  .[name] KitchenArthropathy   . Ascending aortic aneurysm ([place]    a. 12/2016 MRA: 4.3cm @ sinus of valsalva, 4.9cm above sinotubular jxn; b. 02/2018 s/p biological Bentall and AVR.  .[name] KitchenBPH (benign prostatic hyperplasia)   . 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 02/2018 Cath: LAD 20ost, 40p, 722mLCX 60p, OM2 60, OM3 85, RCA 10p/m w/ L->R and R->R collats;  b. 02/2018 CABG x 3: LIMA to LAD, SVG to OM2, SVG to PDA, EVH via right thigh.  . CKD (chronic kidney disease), stage III ([facility]  . Diverticulitis   . Essential hypertension   . GERD (gastroesophageal reflux disease)   . Hemorrhoids   . History of chicken pox   . History of Helicobacter pylori infection 06/2007  . Hyperlipidemia   . Hypertension   . Left renal artery stenosis (HCC)    a. 01/2017 RA u/s: moderate L RAS.  . [name] Kitchenower extremity edema   . Nonrheumatic mitral (valve) insufficiency    a. 12/2016 Echo: mild to mod MR; b. 09/2017 TEE: mild to mod MR; c. 04/2018 Echo: Mild MR.  . [name] Kitchenonrheumatic pulmonary valve insufficiency   . 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 s/p 21 mm Edwards Inspiris Resilia stented bovine pericardial tissue valve and  24 mm Gelweave Valsalva synthetic root conduit with reimplantation of left main coronary artery.    Tobacco Use: Social History   Tobacco Use  Smoking Status Never Smoker  Smokeless Tobacco Never Used    Labs: Recent Review Flowsheet Data    Labs for ITP Cardiac and Pulmonary Rehab Latest Ref Rng & Units 02/23/2018 02/23/2018 02/23/2018 02/23/2018 02/23/2018   Cholestrol 0 - 200 mg/dL - - - - -   LDLCALC 0 - 99 mg/dL - - - - -   HDL >40 mg/dL - - - - -   Trlycerides <150 mg/dL - - - - -   Hemoglobin A1c 4.8 - 5.6 % - - - - -   PHART 7.350 - 7.450 7.345(L) 7.355 7.400 7.370 -   PCO2ART 32.0 - 48.0 mmHg 35.3 35.4 33.1 32.6 -   HCO3 20.0 - 28.0 mmol/L 19.4(L) 19.7(L) 20.3 18.8(L) -   TCO2 22 - 32 mmol/L 20(L) 21(L) 21(L) 20(L)  21(L)   ACIDBASEDEF 0.0 - 2.0 mmol/L 6.0(H) 5.0(H) 4.0(H) 6.0(H) -   O2SAT % 88.0 96.0 94.0 91.0 -       Exercise Target Goals: Date: 07/06/18  Exercise Program Goal: Individual exercise prescription set using results from initial 6 min walk test and THRR while considering  patient's activity barriers and safety.   Exercise Prescription Goal: Initial exercise prescription builds to 30-45 minutes a day of aerobic activity, 2-3 days per week.  Home exercise guidelines will be given to patient during program as part of exercise prescription that the participant will acknowledge.  Activity Barriers & Risk Stratification: Activity Barriers & Cardiac Risk Stratification - 07/06/18 1501      Activity Barriers & Cardiac Risk Stratification   Activity Barriers  Arthritis    Cardiac Risk Stratification  High       6 Minute Walk: 6 Minute Walk    Row Name 07/06/18 1428         6 Minute Walk   Distance  1112 feet     Walk Time  6 minutes     # of Rest Breaks  0     MPH  2.1     METS  1.77     RPE  11     Perceived Dyspnea   0     VO2 Peak  6.21     Symptoms  No     Resting HR  58 bpm     Resting BP  114/62     Resting Oxygen Saturation   95 %     Exercise Oxygen Saturation  during 6 min walk  95 %     Max Ex. HR  80 bpm     Max Ex. BP  114/58     2 Minute Post BP  104/58        Oxygen Initial Assessment:   Oxygen Re-Evaluation:   Oxygen Discharge (Final Oxygen Re-Evaluation):   Initial Exercise Prescription: Initial Exercise Prescription - 07/06/18 1400      Date of Initial Exercise RX and Referring Provider   Date  07/06/18    Referring Provider  [name]      Treadmill   MPH  1.5    Grade  0    Minutes  15    METs  2.15      Recumbant Bike   Level  2    RPM  60    Watts  5    Minutes  15    METs  1.8      NuStep   Level  2    SPM  80    Minutes  15    METs  2      Prescription Details   Frequency (times per week)  3    Duration  Progress to  45 minutes of aerobic exercise without signs/symptoms of physical distress      Intensity   THRR 40-80% of Max Heartrate  91-124    Ratings of Perceived Exertion  11-13    Perceived Dyspnea  0-4      Resistance Training   Training Prescription  Yes    Weight  2 lb    Reps  10-15       Perform Capillary Blood Glucose checks as needed.  Exercise Prescription Changes: Exercise Prescription Changes    Row Name 07/06/18 1400             Response to Exercise   Blood Pressure (Admit)  114/62       Blood Pressure (Exercise)  114/58       Blood Pressure (Exit)  104/58       Heart Rate (Admit)  57 bpm       Heart Rate (Exercise)  77 bpm       Heart Rate (Exit)  80 bpm       Oxygen Saturation (Admit)  98 %       Oxygen Saturation (Exercise)  96 %       Oxygen Saturation (Exit)  97 %       Rating of Perceived Exertion (Exercise)  11          Exercise Comments:   Exercise Goals and Review: Exercise Goals    Row Name 07/06/18 1427             Exercise Goals   Increase Physical Activity  Yes       Intervention  Provide advice, education, support and counseling about physical activity/exercise needs.;Develop an individualized exercise prescription for aerobic and resistive training based on initial evaluation findings, risk stratification, comorbidities and participant's personal goals.       Expected Outcomes  Short Term: Attend rehab on a regular basis to increase amount of physical activity.;Long Term: Add in home exercise to make exercise part of routine and to increase amount of physical activity.;Long Term: Exercising regularly at least 3-5 days a week.       Increase Strength and Stamina  Yes       Intervention  Provide advice, education, support and counseling about physical activity/exercise needs.;Develop an individualized exercise prescription for aerobic and resistive training based on initial evaluation findings, risk stratification, comorbidities and participant's  personal goals.       Expected Outcomes  Short Term: Increase workloads from initial exercise prescription for resistance, speed, and METs.;Short Term: Perform resistance training exercises routinely during rehab and add in resistance training at home;Long Term: Improve cardiorespiratory fitness, muscular endurance and strength as measured by increased METs and functional capacity (6MWT)       Able to understand and use rate of perceived exertion (RPE) scale  Yes       Intervention  Provide education and explanation on how to use RPE scale       Expected Outcomes  Short Term: Able to use RPE daily in rehab to express subjective intensity level;Long Term:  Able to use RPE to guide intensity level when exercising independently       Able to understand  and use Dyspnea scale  Yes       Intervention  Provide education and explanation on how to use Dyspnea scale       Expected Outcomes  Short Term: Able to use Dyspnea scale daily in rehab to express subjective sense of shortness of breath during exertion;Long Term: Able to use Dyspnea scale to guide intensity level when exercising independently       Knowledge and understanding of Target Heart Rate Range (THRR)  Yes       Intervention  Provide education and explanation of THRR including how the numbers were predicted and where they are located for reference       Expected Outcomes  Short Term: Able to state/look up THRR;Short Term: Able to use daily as guideline for intensity in rehab;Long Term: Able to use THRR to govern intensity when exercising independently       Able to check pulse independently  Yes       Intervention  Provide education and demonstration on how to check pulse in carotid and radial arteries.;Review the importance of being able to check your own pulse for safety during independent exercise       Expected Outcomes  Short Term: Able to explain why pulse checking is important during independent exercise;Long Term: Able to check pulse  independently and accurately       Understanding of Exercise Prescription  Yes       Intervention  Provide education, explanation, and written materials on patient's individual exercise prescription       Expected Outcomes  Short Term: Able to explain program exercise prescription;Long Term: Able to explain home exercise prescription to exercise independently          Exercise Goals Re-Evaluation :   Discharge Exercise Prescription (Final Exercise Prescription Changes): Exercise Prescription Changes - 07/06/18 1400      Response to Exercise   Blood Pressure (Admit)  114/62    Blood Pressure (Exercise)  114/58    Blood Pressure (Exit)  104/58    Heart Rate (Admit)  57 bpm    Heart Rate (Exercise)  77 bpm    Heart Rate (Exit)  80 bpm    Oxygen Saturation (Admit)  98 %    Oxygen Saturation (Exercise)  96 %    Oxygen Saturation (Exit)  97 %    Rating of Perceived Exertion (Exercise)  11       Nutrition:  Target Goals: Understanding of nutrition guidelines, daily intake of sodium <1577m, cholesterol <2054m calories 30% from fat and 7% or less from saturated fats, daily to have 5 or more servings of fruits and vegetables.  Biometrics: Pre Biometrics - 07/06/18 1426      Pre Biometrics   Height  5' 6.5" (1.689 m)    Weight  172 lb 12.8 oz (78.4 kg)    Waist Circumference  36.5 inches    Hip Circumference  42 inches    Waist to Hip Ratio  0.87 %    BMI (Calculated)  27.48    Single Leg Stand  4 seconds        Nutrition Therapy Plan and Nutrition Goals: Nutrition Therapy & Goals - 07/06/18 1452      Intervention Plan   Intervention  Nutrition handout(s) given to patient.;Prescribe, educate and counsel regarding individualized specific dietary modifications aiming towards targeted core components such as weight, hypertension, lipid management, diabetes, heart failure and other comorbidities.    Expected Outcomes  Short Term Goal: Understand basic  principles of dietary  content, such as calories, fat, sodium, cholesterol and nutrients.;Short Term Goal: A plan has been developed with personal nutrition goals set during dietitian appointment.;Long Term Goal: Adherence to prescribed nutrition plan.       Nutrition Assessments: Nutrition Assessments - 07/06/18 1452      MEDFICTS Scores   Pre Score  18       Nutrition Goals Re-Evaluation:   Nutrition Goals Discharge (Final Nutrition Goals Re-Evaluation):   Psychosocial: Target Goals: Acknowledge presence or absence of significant depression and/or stress, maximize coping skills, provide positive support system. Participant is able to verbalize types and ability to use techniques and skills needed for reducing stress and depression.   Initial Review & Psychosocial Screening: Initial Psych Review & Screening - 07/06/18 1453      Initial Review   Current issues with  Current Sleep Concerns;Current Stress Concerns    Comments  Cloys is getting back to being independent living at home. 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 Now he just wants to get back to doing whatever he pleases.       Family Dynamics   Good Support System?  Yes children, family      Barriers   Psychosocial barriers to participate in program  There are no identifiable barriers or psychosocial needs.;The patient should benefit from training in stress management and relaxation.      Screening Interventions   Interventions  Encouraged to exercise;Provide feedback about the scores to participant;Program counselor consult;To provide support and resources with identified psychosocial needs    Expected Outcomes  Short Term goal: Utilizing psychosocial counselor, staff and physician to assist with identification of specific Stressors or current issues interfering with healing process. Setting desired goal for each stressor or current issue  identified.;Long Term Goal: Stressors or current issues are controlled or eliminated.;Short Term goal: Identification and review with participant of any Quality of Life or Depression concerns found by scoring the questionnaire.;Long Term goal: The participant improves quality of Life and PHQ9 Scores as seen by post scores and/or verbalization of changes       Quality of Life Scores:  Quality of Life - 07/06/18 1454      Quality of Life   Select  Quality of Life      Quality of Life Scores   Health/Function Pre  28.08 %    Socioeconomic Pre  30 %    Psych/Spiritual Pre  30 %    Family Pre  30 %    GLOBAL Pre  28.92 %      Scores of 19 and below usually indicate a poorer quality of life in these areas.  A difference of  2-3 points is a clinically meaningful difference.  A difference of 2-3 points in the total score of the Quality of Life Index has been associated with significant improvement in overall quality of life, self-image, physical symptoms, and general health in studies assessing change in quality of life.  PHQ-9: Recent Review Flowsheet Data    Depression screen [facility] 2/9 07/06/2018   Decreased Interest 0   Down, Depressed, Hopeless 0   PHQ - 2 Score 0   Altered sleeping 1   Tired, decreased energy 0   Change in appetite 0   Feeling bad or failure about yourself  0   Trouble concentrating 0   Moving slowly or fidgety/restless 0   Suicidal thoughts  0   PHQ-9 Score 1   Difficult doing work/chores Not difficult at all     Interpretation of Total Score  Total Score Depression Severity:  1-4 = Minimal depression, 5-9 = Mild depression, 10-14 = Moderate depression, 15-19 = Moderately severe depression, 20-27 = Severe depression   Psychosocial Evaluation and Intervention:   Psychosocial Re-Evaluation:   Psychosocial Discharge (Final Psychosocial Re-Evaluation):   Vocational Rehabilitation: Provide vocational rehab assistance to qualifying candidates.   Vocational  Rehab Evaluation & Intervention: Vocational Rehab - 07/06/18 1500      Initial Vocational Rehab Evaluation & Intervention   Assessment shows need for Vocational Rehabilitation  No       Education: Education Goals: Education classes will be provided on a variety of topics geared toward better understanding of heart health and risk factor modification. Participant will state understanding/return demonstration of topics presented as noted by education test scores.  Learning Barriers/Preferences: Learning Barriers/Preferences - 07/06/18 1457      Learning Barriers/Preferences   Learning Barriers  None    Learning Preferences  Group Instruction       Education Topics:  AED/CPR: - Group verbal and written instruction with the use of models to demonstrate the basic use of the AED with the basic ABC's of resuscitation.   General Nutrition Guidelines/Fats and Fiber: -Group instruction provided by verbal, written material, models and posters to present the general guidelines for heart healthy nutrition. Gives an explanation and review of dietary fats and fiber.   Controlling Sodium/Reading Food Labels: -Group verbal and written material supporting the discussion of sodium use in heart healthy nutrition. Review and explanation with models, verbal and written materials for utilization of the food label.   Exercise Physiology & General Exercise Guidelines: - Group verbal and written instruction with models to review the exercise physiology of the cardiovascular system and associated critical values. Provides general exercise guidelines with specific guidelines to those with heart or lung disease.    Aerobic Exercise & Resistance Training: - Gives group verbal and written instruction on the various components of exercise. Focuses on aerobic and resistive training programs and the benefits of this training and how to safely progress through these programs..   Flexibility, Balance,  Mind/Body Relaxation: Provides group verbal/written instruction on the benefits of flexibility and balance training, including mind/body exercise modes such as yoga, pilates and tai chi.  Demonstration and skill practice provided.   Stress and Anxiety: - Provides group verbal and written instruction about the health risks of elevated stress and causes of high stress.  Discuss the correlation between heart/lung disease and anxiety and treatment options. Review healthy ways to manage with stress and anxiety.   Depression: - Provides group verbal and written instruction on the correlation between heart/lung disease and depressed mood, treatment options, and the stigmas associated with seeking treatment.   Anatomy & Physiology of the Heart: - Group verbal and written instruction and models provide basic cardiac anatomy and physiology, with the coronary electrical and arterial systems. Review of Valvular disease and Heart Failure   Cardiac Procedures: - Group verbal and written instruction to review commonly prescribed medications for heart disease. Reviews the medication, class of the drug, and side effects. Includes the steps to properly store meds and maintain the prescription regimen. (beta blockers and nitrates)   Cardiac Medications I: - Group verbal and written instruction to review commonly prescribed medications for heart disease. Reviews the medication, class of the drug, and side effects. Includes the steps to properly  store meds and maintain the prescription regimen.   Cardiac Medications II: -Group verbal and written instruction to review commonly prescribed medications for heart disease. Reviews the medication, class of the drug, and side effects. (all other drug classes)    Go Sex-Intimacy & Heart Disease, Get SMART - Goal Setting: - Group verbal and written instruction through game format to discuss heart disease and the return to sexual intimacy. Provides group verbal and  written material to discuss and apply goal setting through the application of the S.M.A.R.T. Method.   Other Matters of the Heart: - Provides group verbal, written materials and models to describe Stable Angina and Peripheral Artery. Includes description of the disease process and treatment options available to the cardiac patient.   Exercise & Equipment Safety: - Individual verbal instruction and demonstration of equipment use and safety with use of the equipment.   Cardiac Rehab from 07/06/2018 in [facility] Cardiac and Pulmonary Rehab  Date  07/06/18  Educator  [facility]  Instruction Review Code  1- Verbalizes Understanding      Infection Prevention: - Provides verbal and written material to individual with discussion of infection control including proper hand washing and proper equipment cleaning during exercise session.   Cardiac Rehab from 07/06/2018 in [facility] Cardiac and Pulmonary Rehab  Date  07/06/18  Educator  [facility]  Instruction Review Code  1- Verbalizes Understanding      Falls Prevention: - Provides verbal and written material to individual with discussion of falls prevention and safety.   Cardiac Rehab from 07/06/2018 in [facility] Cardiac and Pulmonary Rehab  Date  07/06/18  Educator  [facility]  Instruction Review Code  1- Verbalizes Understanding      Diabetes: - Individual verbal and written instruction to review signs/symptoms of diabetes, desired ranges of glucose level fasting, after meals and with exercise. Acknowledge that pre and post exercise glucose checks will be done for 3 sessions at entry of program.   Know Your Numbers and Risk Factors: -Group verbal and written instruction about important numbers in your health.  Discussion of what are risk factors and how they play a role in the disease process.  Review of Cholesterol, Blood Pressure, Diabetes, and BMI and the role they play in your overall health.   Sleep Hygiene: -Provides group verbal and written instruction about how sleep  can affect your health.  Define sleep hygiene, discuss sleep cycles and impact of sleep habits. Review good sleep hygiene tips.    Other: -Provides group and verbal instruction on various topics (see comments)   Knowledge Questionnaire Score: Knowledge Questionnaire Score - 07/06/18 1457      Knowledge Questionnaire Score   Pre Score  15/26 correct answers reviewed with Mortimer Fries, focus on nutrition, exercise       Core Components/Risk Factors/Patient Goals at Admission: Personal Goals and Risk Factors at Admission - 07/06/18 1446      Core Components/Risk Factors/Patient Goals on Admission    Weight Management  Yes;Weight Maintenance    Intervention  Weight Management: Develop a combined nutrition and exercise program designed to reach desired caloric intake, while maintaining appropriate intake of nutrient and fiber, sodium and fats, and appropriate energy expenditure required for the weight goal.;Weight Management: Provide education and appropriate resources to help participant work on and attain dietary goals.    Admit Weight  170 lb (77.1 kg)    Expected Outcomes  Long Term: Adherence to nutrition and physical activity/exercise program aimed toward attainment of established weight goal;Weight Maintenance: Understanding of  the daily nutrition guidelines, which includes 25-35% calories from fat, 7% or less cal from saturated fats, less than 219m cholesterol, less than 1.5gm of sodium, & 5 or more servings of fruits and vegetables daily;Short Term: Continue to assess and modify interventions until short term weight is achieved;Understanding recommendations for meals to include 15-35% energy as protein, 25-35% energy from fat, 35-60% energy from carbohydrates, less than 2038mof dietary cholesterol, 20-35 gm of total fiber daily;Understanding of distribution of calorie intake throughout the day with the consumption of 4-5 meals/snacks    Hypertension  Yes    Intervention  Provide education on  lifestyle modifcations including regular physical activity/exercise, weight management, moderate sodium restriction and increased consumption of fresh fruit, vegetables, and low fat dairy, alcohol moderation, and smoking cessation.;Monitor prescription use compliance.    Expected Outcomes  Short Term: Continued assessment and intervention until BP is < 140/9044mG in hypertensive participants. < 130/22m62m in hypertensive participants with diabetes, heart failure or chronic kidney disease.;Long Term: Maintenance of blood pressure at goal levels.    Lipids  Yes    Intervention  Provide education and support for participant on nutrition & aerobic/resistive exercise along with prescribed medications to achieve LDL <70mg60mL >40mg.46mExpected Outcomes  Short Term: Participant states understanding of desired cholesterol values and is compliant with medications prescribed. Participant is following exercise prescription and nutrition guidelines.;Long Term: Cholesterol controlled with medications as prescribed, with individualized exercise RX and with personalized nutrition plan. Value goals: LDL < 70mg, 91m> 40 mg.       Core Components/Risk Factors/Patient Goals Review:    Core Components/Risk Factors/Patient Goals at Discharge (Final Review):    ITP Comments: ITP Comments    Row Name 07/06/18 1443           ITP Comments  Med Review completed. Initial ITP created. Diagnosis can be found in [facility]        Comments: Initial ITP

## 2018-07-06 NOTE — Patient Instructions (Signed)
Patient Instructions  Patient Details  Name: Rodney Wiley MRN: [id number] Date of Birth: October 20, 1936 Referring Provider:  Wilder Glade*  Below are your personal goals for exercise, nutrition, and risk factors. Our goal is to help you stay on track towards obtaining and maintaining these goals. We will be discussing your progress on these goals with you throughout the program.  Initial Exercise Prescription: Initial Exercise Prescription - 07/06/18 1400      Date of Initial Exercise RX and Referring Provider   Date  07/06/18    Referring Provider  [name]      Treadmill   MPH  1.5    Grade  0    Minutes  15    METs  2.15      Recumbant Bike   Level  2    RPM  60    Watts  5    Minutes  15    METs  1.8      NuStep   Level  2    SPM  80    Minutes  15    METs  2      Prescription Details   Frequency (times per week)  3    Duration  Progress to 45 minutes of aerobic exercise without signs/symptoms of physical distress      Intensity   THRR 40-80% of Max Heartrate  91-124    Ratings of Perceived Exertion  11-13    Perceived Dyspnea  0-4      Resistance Training   Training Prescription  Yes    Weight  2 lb    Reps  10-15       Exercise Goals: Frequency: Be able to perform aerobic exercise two to three times per week in program working toward 2-5 days per week of home exercise.  Intensity: Work with a perceived exertion of 11 (fairly light) - 15 (hard) while following your exercise prescription.  We will make changes to your prescription with you as you progress through the program.   Duration: Be able to do 30 to 45 minutes of continuous aerobic exercise in addition to a 5 minute warm-up and a 5 minute cool-down routine.   Nutrition Goals: Your personal nutrition goals will be established when you do your nutrition analysis with the dietician.  The following are general nutrition guidelines to follow: Cholesterol < 200mg /day Sodium <  1500mg /day Fiber: Men over 50 yrs - 30 grams per day  Personal Goals: Personal Goals and Risk Factors at Admission - 07/06/18 1446      Core Components/Risk Factors/Patient Goals on Admission    Weight Management  Yes;Weight Maintenance    Intervention  Weight Management: Develop a combined nutrition and exercise program designed to reach desired caloric intake, while maintaining appropriate intake of nutrient and fiber, sodium and fats, and appropriate energy expenditure required for the weight goal.;Weight Management: Provide education and appropriate resources to help participant work on and attain dietary goals.    Admit Weight  170 lb (77.1 kg)    Expected Outcomes  Long Term: Adherence to nutrition and physical activity/exercise program aimed toward attainment of established weight goal;Weight Maintenance: Understanding of the daily nutrition guidelines, which includes 25-35% calories from fat, 7% or less cal from saturated fats, less than 200mg  cholesterol, less than 1.5gm of sodium, & 5 or more servings of fruits and vegetables daily;Short Term: Continue to assess and modify interventions until short term weight is achieved;Understanding recommendations for meals to include 15-35% energy  as protein, 25-35% energy from fat, 35-60% energy from carbohydrates, less than 200mg  of dietary cholesterol, 20-35 gm of total fiber daily;Understanding of distribution of calorie intake throughout the day with the consumption of 4-5 meals/snacks    Hypertension  Yes    Intervention  Provide education on lifestyle modifcations including regular physical activity/exercise, weight management, moderate sodium restriction and increased consumption of fresh fruit, vegetables, and low fat dairy, alcohol moderation, and smoking cessation.;Monitor prescription use compliance.    Expected Outcomes  Short Term: Continued assessment and intervention until BP is < 140/33mm HG in hypertensive participants. < 130/30mm HG in  hypertensive participants with diabetes, heart failure or chronic kidney disease.;Long Term: Maintenance of blood pressure at goal levels.    Lipids  Yes    Intervention  Provide education and support for participant on nutrition & aerobic/resistive exercise along with prescribed medications to achieve LDL 70mg , HDL >40mg .    Expected Outcomes  Short Term: Participant states understanding of desired cholesterol values and is compliant with medications prescribed. Participant is following exercise prescription and nutrition guidelines.;Long Term: Cholesterol controlled with medications as prescribed, with individualized exercise RX and with personalized nutrition plan. Value goals: LDL < 70mg , HDL > 40 mg.       Tobacco Use Initial Evaluation: Social History   Tobacco Use  Smoking Status Never Smoker  Smokeless Tobacco Never Used    Exercise Goals and Review: Exercise Goals    Row Name 07/06/18 1427             Exercise Goals   Increase Physical Activity  Yes       Intervention  Provide advice, education, support and counseling about physical activity/exercise needs.;Develop an individualized exercise prescription for aerobic and resistive training based on initial evaluation findings, risk stratification, comorbidities and participant's personal goals.       Expected Outcomes  Short Term: Attend rehab on a regular basis to increase amount of physical activity.;Long Term: Add in home exercise to make exercise part of routine and to increase amount of physical activity.;Long Term: Exercising regularly at least 3-5 days a week.       Increase Strength and Stamina  Yes       Intervention  Provide advice, education, support and counseling about physical activity/exercise needs.;Develop an individualized exercise prescription for aerobic and resistive training based on initial evaluation findings, risk stratification, comorbidities and participant's personal goals.       Expected Outcomes   Short Term: Increase workloads from initial exercise prescription for resistance, speed, and METs.;Short Term: Perform resistance training exercises routinely during rehab and add in resistance training at home;Long Term: Improve cardiorespiratory fitness, muscular endurance and strength as measured by increased METs and functional capacity (6MWT)       Able to understand and use rate of perceived exertion (RPE) scale  Yes       Intervention  Provide education and explanation on how to use RPE scale       Expected Outcomes  Short Term: Able to use RPE daily in rehab to express subjective intensity level;Long Term:  Able to use RPE to guide intensity level when exercising independently       Able to understand and use Dyspnea scale  Yes       Intervention  Provide education and explanation on how to use Dyspnea scale       Expected Outcomes  Short Term: Able to use Dyspnea scale daily in rehab to express subjective sense of shortness of  breath during exertion;Long Term: Able to use Dyspnea scale to guide intensity level when exercising independently       Knowledge and understanding of Target Heart Rate Range (THRR)  Yes       Intervention  Provide education and explanation of THRR including how the numbers were predicted and where they are located for reference       Expected Outcomes  Short Term: Able to state/look up THRR;Short Term: Able to use daily as guideline for intensity in rehab;Long Term: Able to use THRR to govern intensity when exercising independently       Able to check pulse independently  Yes       Intervention  Provide education and demonstration on how to check pulse in carotid and radial arteries.;Review the importance of being able to check your own pulse for safety during independent exercise       Expected Outcomes  Short Term: Able to explain why pulse checking is important during independent exercise;Long Term: Able to check pulse independently and accurately       Understanding  of Exercise Prescription  Yes       Intervention  Provide education, explanation, and written materials on patient's individual exercise prescription       Expected Outcomes  Short Term: Able to explain program exercise prescription;Long Term: Able to explain home exercise prescription to exercise independently          Copy of goals given to participant.

## 2018-07-06 NOTE — Progress Notes (Signed)
Daily Session Note  Patient Details  Name: Rodney Wiley MRN: [id number] Date of Birth: [birth year] Referring Provider:     Cardiac Rehab from 07/06/2018 in [facility] Cardiac and Pulmonary Rehab  Referring Provider  [name]      Encounter Date: 07/06/2018  Check In: Session Check In - 07/06/18 1443      Check-In   Supervising physician immediately available to respond to emergencies  See telemetry face sheet for immediately available ER MD    Location  [facility]-Cardiac & Pulmonary Rehab    Staff Present  Renita Papa, RN Vickki Hearing, BA, ACSM CEP, Exercise Physiologist    Warm-up and Cool-down  Not performed (comment) med review    Resistance Training Performed  Yes    VAD Patient?  No    PAD/SET Patient?  No      Pain Assessment   Currently in Pain?  No/denies        Exercise Prescription Changes - 07/06/18 1400      Response to Exercise   Blood Pressure (Admit)  114/62    Blood Pressure (Exercise)  114/58    Blood Pressure (Exit)  104/58    Heart Rate (Admit)  57 bpm    Heart Rate (Exercise)  77 bpm    Heart Rate (Exit)  80 bpm    Oxygen Saturation (Admit)  98 %    Oxygen Saturation (Exercise)  96 %    Oxygen Saturation (Exit)  97 %    Rating of Perceived Exertion (Exercise)  11       Social History   Tobacco Use  Smoking Status Never Smoker  Smokeless Tobacco Never Used    Goals Met:  Proper associated with RPD/PD & O2 Sat Exercise tolerated well Personal goals reviewed No report of cardiac concerns or symptoms Strength training completed today  Goals Unmet:  Not Applicable  Comments: Med review completed.    Dr. Emily Filbert is Medical Director for [place] and [facility].

## 2018-07-10 DIAGNOSIS — Z952 Presence of prosthetic heart valve: Secondary | ICD-10-CM | POA: Diagnosis not present

## 2018-07-10 DIAGNOSIS — Z951 Presence of aortocoronary bypass graft: Secondary | ICD-10-CM | POA: Diagnosis not present

## 2018-07-10 NOTE — Progress Notes (Signed)
Daily Session Note  Patient Details  Name: Rodney Wiley MRN: [id number] Date of Birth: 1935-12-10 Referring Provider:     Cardiac Rehab from 07/06/2018 in [facility] Cardiac and Pulmonary Rehab  Referring Provider  [name]      Encounter Date: 07/10/2018  Check In: Session Check In - 07/10/18 1659      Check-In   Supervising physician immediately available to respond to emergencies  See telemetry face sheet for immediately available ER MD    Location  [facility]-Cardiac & Pulmonary Rehab    Staff Present  Gerlene Burdock, RN, Moises Blood, BS, ACSM CEP, Exercise Physiologist;Kenta Laster Oletta Darter, [place], ACSM CEP, Exercise Physiologist    Medication changes reported      No    Fall or balance concerns reported     No    Warm-up and Cool-down  Performed on first and last piece of equipment    Resistance Training Performed  Yes    VAD Patient?  No    PAD/SET Patient?  No      Pain Assessment   Currently in Pain?  No/denies    Multiple Pain Sites  No          Social History   Tobacco Use  Smoking Status Never Smoker  Smokeless Tobacco Never Used    Goals Met:  Independence with exercise equipment Exercise tolerated well No report of cardiac concerns or symptoms Strength training completed today  Goals Unmet:  Not Applicable  Comments: First full day of exercise!  Patient was oriented to gym and equipment including functions, settings, policies, and procedures.  Patient's individual exercise prescription and treatment plan were reviewed.  All starting workloads were established based on the results of the 6 minute walk test done at initial orientation visit.  The plan for exercise progression was also introduced and progression will be customized based on patient's performance and goals.    Dr. Emily Filbert is Medical Director for [place] and [facility].

## 2018-07-11 DIAGNOSIS — R51 Headache: Secondary | ICD-10-CM | POA: Diagnosis not present

## 2018-07-11 DIAGNOSIS — I129 Hypertensive chronic kidney disease with stage 1 through stage 4 chronic kidney disease, or unspecified chronic kidney disease: Secondary | ICD-10-CM | POA: Diagnosis not present

## 2018-07-11 DIAGNOSIS — N281 Cyst of kidney, acquired: Secondary | ICD-10-CM | POA: Diagnosis not present

## 2018-07-11 DIAGNOSIS — D649 Anemia, unspecified: Secondary | ICD-10-CM | POA: Diagnosis not present

## 2018-07-11 DIAGNOSIS — N184 Chronic kidney disease, stage 4 (severe): Secondary | ICD-10-CM | POA: Diagnosis not present

## 2018-07-12 ENCOUNTER — Encounter: Payer: Self-pay | Admitting: *Deleted

## 2018-07-12 ENCOUNTER — Encounter: Payer: Medicare HMO | Admitting: *Deleted

## 2018-07-12 DIAGNOSIS — Z952 Presence of prosthetic heart valve: Secondary | ICD-10-CM | POA: Diagnosis not present

## 2018-07-12 DIAGNOSIS — Z951 Presence of aortocoronary bypass graft: Secondary | ICD-10-CM | POA: Diagnosis not present

## 2018-07-12 NOTE — Progress Notes (Signed)
Daily Session Note  Patient Details  Name: Rodney Wiley MRN: [id number] Date of Birth: 1936-10-27 Referring Provider:     Cardiac Rehab from 07/06/2018 in [facility] Cardiac and Pulmonary Rehab  Referring Provider  Rodney Wiley      Encounter Date: 07/12/2018  Check In: Session Check In - 07/12/18 1623      Check-In   Supervising physician immediately available to respond to emergencies  See telemetry face sheet for immediately available ER MD    Location  [facility]-Cardiac & Pulmonary Rehab    Staff Present  Gerlene Burdock, RN, BSN;Rodney Sherryll Burger, RN Rodney Wiley, BA, ACSM CEP, Exercise Physiologist    Medication changes reported      No    Fall or balance concerns reported     No    Tobacco Cessation  No Change    Warm-up and Cool-down  Performed on first and last piece of equipment    Resistance Training Performed  Yes    VAD Patient?  No      Pain Assessment   Currently in Pain?  No/denies          Social History   Tobacco Use  Smoking Status Never Smoker  Smokeless Tobacco Never Used    Goals Met:  Proper associated with RPD/PD & O2 Sat Independence with exercise equipment Exercise tolerated well No report of cardiac concerns or symptoms Strength training completed today  Goals Unmet:  Not Applicable  Comments:     Dr. Emily Wiley is Medical Director for [place] and [facility].

## 2018-07-17 DIAGNOSIS — Z952 Presence of prosthetic heart valve: Secondary | ICD-10-CM | POA: Diagnosis not present

## 2018-07-17 DIAGNOSIS — Z951 Presence of aortocoronary bypass graft: Secondary | ICD-10-CM | POA: Diagnosis not present

## 2018-07-17 NOTE — Progress Notes (Signed)
Daily Session Note  Patient Details  Name: Rodney Wiley MRN: [id number] Date of Birth: 07/09/1936 Referring Provider:     Cardiac Rehab from 07/06/2018 in [facility] Cardiac and Pulmonary Rehab  Referring Provider  [name]      Encounter Date: 07/17/2018  Check In: Session Check In - 07/17/18 1700      Check-In   Supervising physician immediately available to respond to emergencies  See telemetry face sheet for immediately available ER MD    Location  [facility]-Cardiac & Pulmonary Rehab    Staff Present  Earlean Shawl, BS, ACSM CEP, Exercise Physiologist;Susanne Bice, RN, BSN, CCRP;Ceana Fiala, BA, ACSM CEP, Exercise Physiologist    Medication changes reported      No    Fall or balance concerns reported     No    Warm-up and Cool-down  Performed on first and last piece of equipment    Resistance Training Performed  Yes    VAD Patient?  No    PAD/SET Patient?  No      Pain Assessment   Currently in Pain?  No/denies    Multiple Pain Sites  No          Social History   Tobacco Use  Smoking Status Never Smoker  Smokeless Tobacco Never Used    Goals Met:  Independence with exercise equipment Exercise tolerated well No report of cardiac concerns or symptoms Strength training completed today  Goals Unmet:  Not Applicable  Comments: Pt able to follow exercise prescription today without complaint.  Will continue to monitor for progression.    Dr. Emily Filbert is Medical Director for [place] and [facility].

## 2018-07-19 ENCOUNTER — Encounter: Payer: Self-pay | Admitting: *Deleted

## 2018-07-19 ENCOUNTER — Encounter: Payer: Medicare HMO | Admitting: *Deleted

## 2018-07-19 DIAGNOSIS — Z951 Presence of aortocoronary bypass graft: Secondary | ICD-10-CM | POA: Diagnosis not present

## 2018-07-19 DIAGNOSIS — Z952 Presence of prosthetic heart valve: Secondary | ICD-10-CM | POA: Diagnosis not present

## 2018-07-19 NOTE — Progress Notes (Signed)
Daily Session Note  Patient Details  Name: Rodney Wiley MRN: [id number] Date of Birth: 1936/04/02 Referring Provider:     Cardiac Rehab from 07/06/2018 in [facility] Cardiac and Pulmonary Rehab  Referring Provider  [name]      Encounter Date: 07/19/2018  Check In: Session Check In - 07/19/18 1702      Check-In   Supervising physician immediately available to respond to emergencies  See telemetry face sheet for immediately available ER MD    Location  [facility]-Cardiac & Pulmonary Rehab    Staff Present  Gerlene Burdock, RN, BSN;Elizabeth Paulsen Sherryll Burger, RN Vickki Hearing, BA, ACSM CEP, Exercise Physiologist    Medication changes reported      No    Fall or balance concerns reported     No    Tobacco Cessation  No Change    Warm-up and Cool-down  Performed on first and last piece of equipment    Resistance Training Performed  Yes    VAD Patient?  No      Pain Assessment   Currently in Pain?  No/denies          Social History   Tobacco Use  Smoking Status Never Smoker  Smokeless Tobacco Never Used    Goals Met:  Independence with exercise equipment Exercise tolerated well No report of cardiac concerns or symptoms Strength training completed today  Goals Unmet:  Not Applicable  Comments: Pt able to follow exercise prescription today without complaint.  Will continue to monitor for progression.    Dr. Emily Filbert is Medical Director for [place] and [facility].

## 2018-07-19 NOTE — Progress Notes (Signed)
Cardiac Individual Treatment Plan  Patient Details  Name: Rodney Wiley MRN: [id number] Date of Birth: Oct 27, 1936 Referring Provider:     Cardiac Rehab from 07/06/2018 in [facility] Cardiac and Pulmonary Rehab  Referring Provider  [name]      Initial Encounter Date:    Cardiac Rehab from 07/06/2018 in [facility] Cardiac and Pulmonary Rehab  Date  07/06/18      Visit Diagnosis: S/P CABG x 3  Patient's Home Medications on Admission:  Current Outpatient Medications:  .  amLODipine (NORVASC) 10 MG tablet, Take 10 mg by mouth daily., Disp: , Rfl:  .  atorvastatin (LIPITOR) 20 MG tablet, Take 1 tablet (20 mg total) by mouth daily., Disp: 90 tablet, Rfl: 3 .  carvedilol (COREG) 6.25 MG tablet, Take 1 tablet (6.25 mg total) by mouth 2 (two) times daily., Disp: 180 tablet, Rfl: 3 .  Cholecalciferol (VITAMIN D3) 2000 units capsule, Take 2,000 Units by mouth daily. , Disp: , Rfl:  .  doxazosin (CARDURA) 2 MG tablet, Take 2 mg by mouth daily., Disp: , Rfl:  .  ferrous sulfate 325 (65 FE) MG EC tablet, Take 325 mg by mouth 2 (two) times daily. , Disp: , Rfl:  .  fluticasone-salmeterol (ADVAIR HFA) 115-21 MCG/ACT inhaler, Inhale 2 puffs into the lungs as needed. Use with spacer/ Aerochamber , Disp: , Rfl:  .  folic acid-pyridoxine-cyancobalamin (FOLTX) 2.5-25-2 MG TABS tablet, Take 1 tablet by mouth daily., Disp: 30 each, Rfl:  .  sennosides-docusate sodium (SENOKOT-S) 8.6-50 MG tablet, Take 1 tablet by mouth 2 (two) times daily. , Disp: , Rfl:  .  torsemide (DEMADEX) 20 MG tablet, Take 1 tablet (20 mg total) by mouth daily as needed., Disp: 180 tablet, Rfl: 3 .  vitamin C (ASCORBIC ACID) 500 MG tablet, Take 500 mg by mouth 2 (two) times daily., Disp: , Rfl:   Past Medical History: Past Medical History:  Diagnosis Date  . Aortic insufficiency    a. 02/2018 s/p bioprosthetic AVR; 04/2018 Echo: EF 50-55%, Gr2 DD, Ao bioprosthesis, mean grad 93mHg, Ao root/Asc Ao nl in size, mild MR, mildly dil LA, nl RV fxn. Nl  PASP.  .[name] KitchenArthropathy   . Ascending aortic aneurysm ([place]    a. 12/2016 MRA: 4.3cm @ sinus of valsalva, 4.9cm above sinotubular jxn; b. 02/2018 s/p biological Bentall and AVR.  .[name] KitchenBPH (benign prostatic hyperplasia)   . 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 02/2018 Cath: LAD 20ost, 40p, 722mLCX 60p, OM2 60, OM3 85, RCA 10p/m w/ L->R and R->R collats;  b. 02/2018 CABG x 3: LIMA to LAD, SVG to OM2, SVG to PDA, EVH via right thigh.  . CKD (chronic kidney disease), stage III ([facility]  . Diverticulitis   . Essential hypertension   . GERD (gastroesophageal reflux disease)   . Hemorrhoids   . History of chicken pox   . History of Helicobacter pylori infection 06/2007  . Hyperlipidemia   . Hypertension   . Left renal artery stenosis (HCC)    a. 01/2017 RA u/s: moderate L RAS.  . [name] Kitchenower extremity edema   . Nonrheumatic mitral (valve) insufficiency    a. 12/2016 Echo: mild to mod MR; b. 09/2017 TEE: mild to mod MR; c. 04/2018 Echo: Mild MR.  . [name] Kitchenonrheumatic pulmonary valve insufficiency   . 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 s/p 21 mm Edwards Inspiris Resilia stented bovine pericardial tissue valve and  24 mm Gelweave Valsalva synthetic root conduit with reimplantation of left main coronary artery.    Tobacco Use: Social History   Tobacco Use  Smoking Status Never Smoker  Smokeless Tobacco Never Used    Labs: Recent Review Flowsheet Data    Labs for ITP Cardiac and Pulmonary Rehab Latest Ref Rng & Units 02/23/2018 02/23/2018 02/23/2018 02/23/2018 02/23/2018   Cholestrol 0 - 200 mg/dL - - - - -   LDLCALC 0 - 99 mg/dL - - - - -   HDL >40 mg/dL - - - - -   Trlycerides <150 mg/dL - - - - -   Hemoglobin A1c 4.8 - 5.6 % - - - - -   PHART 7.350 - 7.450 7.345(L) 7.355 7.400 7.370 -   PCO2ART 32.0 - 48.0 mmHg 35.3 35.4 33.1 32.6 -   HCO3 20.0 - 28.0 mmol/L 19.4(L) 19.7(L) 20.3 18.8(L) -   TCO2 22 - 32 mmol/L 20(L) 21(L) 21(L) 20(L)  21(L)   ACIDBASEDEF 0.0 - 2.0 mmol/L 6.0(H) 5.0(H) 4.0(H) 6.0(H) -   O2SAT % 88.0 96.0 94.0 91.0 -       Exercise Target Goals: Exercise Program Goal: Individual exercise prescription set using results from initial 6 min walk test and THRR while considering  patient's activity barriers and safety.   Exercise Prescription Goal: Initial exercise prescription builds to 30-45 minutes a day of aerobic activity, 2-3 days per week.  Home exercise guidelines will be given to patient during program as part of exercise prescription that the participant will acknowledge.  Activity Barriers & Risk Stratification: Activity Barriers & Cardiac Risk Stratification - 07/06/18 1501      Activity Barriers & Cardiac Risk Stratification   Activity Barriers  Arthritis    Cardiac Risk Stratification  High       6 Minute Walk: 6 Minute Walk    Row Name 07/06/18 1428         6 Minute Walk   Distance  1112 feet     Walk Time  6 minutes     # of Rest Breaks  0     MPH  2.1     METS  1.77     RPE  11     Perceived Dyspnea   0     VO2 Peak  6.21     Symptoms  No     Resting HR  58 bpm     Resting BP  114/62     Resting Oxygen Saturation   95 %     Exercise Oxygen Saturation  during 6 min walk  95 %     Max Ex. HR  80 bpm     Max Ex. BP  114/58     2 Minute Post BP  104/58        Oxygen Initial Assessment:   Oxygen Re-Evaluation:   Oxygen Discharge (Final Oxygen Re-Evaluation):   Initial Exercise Prescription: Initial Exercise Prescription - 07/06/18 1400      Date of Initial Exercise RX and Referring Provider   Date  07/06/18    Referring Provider  [name]      Treadmill   MPH  1.5    Grade  0    Minutes  15    METs  2.15      Recumbant Bike   Level  2    RPM  60    Watts  5    Minutes  15    METs  1.8  NuStep   Level  2    SPM  80    Minutes  15    METs  2      Prescription Details   Frequency (times per week)  3    Duration  Progress to 45 minutes of  aerobic exercise without signs/symptoms of physical distress      Intensity   THRR 40-80% of Max Heartrate  91-124    Ratings of Perceived Exertion  11-13    Perceived Dyspnea  0-4      Resistance Training   Training Prescription  Yes    Weight  2 lb    Reps  10-15       Perform Capillary Blood Glucose checks as needed.  Exercise Prescription Changes: Exercise Prescription Changes    Row Name 07/06/18 1400 07/11/18 1500           Response to Exercise   Blood Pressure (Admit)  114/62  122/60      Blood Pressure (Exercise)  114/58  130/58      Blood Pressure (Exit)  104/58  132/74      Heart Rate (Admit)  57 bpm  56 bpm      Heart Rate (Exercise)  77 bpm  88 bpm      Heart Rate (Exit)  80 bpm  78 bpm      Oxygen Saturation (Admit)  98 %  -      Oxygen Saturation (Exercise)  96 %  -      Oxygen Saturation (Exit)  97 %  -      Rating of Perceived Exertion (Exercise)  11  12      Comments  -  first session      Duration  -  Continue with 45 min of aerobic exercise without signs/symptoms of physical distress.      Intensity  -  THRR unchanged        Progression   Progression  -  Continue to progress workloads to maintain intensity without signs/symptoms of physical distress.      Average METs  -  2.98        Resistance Training   Training Prescription  -  Yes      Weight  -  2 lb      Reps  -  10-15        Recumbant Bike   Level  -  5      RPM  -  60      Watts  -  25      Minutes  -  15      METs  -  2.98         Exercise Comments: Exercise Comments    Row Name 07/10/18 1700           Exercise Comments   First full day of exercise!  Patient was oriented to gym and equipment including functions, settings, policies, and procedures.  Patient's individual exercise prescription and treatment plan were reviewed.  All starting workloads were established based on the results of the 6 minute walk test done at initial orientation visit.  The plan for exercise  progression was also introduced and progression will be customized based on patient's performance and goals.          Exercise Goals and Review: Exercise Goals    Row Name 07/06/18 1427             Exercise Goals  Increase Physical Activity  Yes       Intervention  Provide advice, education, support and counseling about physical activity/exercise needs.;Develop an individualized exercise prescription for aerobic and resistive training based on initial evaluation findings, risk stratification, comorbidities and participant's personal goals.       Expected Outcomes  Short Term: Attend rehab on a regular basis to increase amount of physical activity.;Long Term: Add in home exercise to make exercise part of routine and to increase amount of physical activity.;Long Term: Exercising regularly at least 3-5 days a week.       Increase Strength and Stamina  Yes       Intervention  Provide advice, education, support and counseling about physical activity/exercise needs.;Develop an individualized exercise prescription for aerobic and resistive training based on initial evaluation findings, risk stratification, comorbidities and participant's personal goals.       Expected Outcomes  Short Term: Increase workloads from initial exercise prescription for resistance, speed, and METs.;Short Term: Perform resistance training exercises routinely during rehab and add in resistance training at home;Long Term: Improve cardiorespiratory fitness, muscular endurance and strength as measured by increased METs and functional capacity (6MWT)       Able to understand and use rate of perceived exertion (RPE) scale  Yes       Intervention  Provide education and explanation on how to use RPE scale       Expected Outcomes  Short Term: Able to use RPE daily in rehab to express subjective intensity level;Long Term:  Able to use RPE to guide intensity level when exercising independently       Able to understand and use Dyspnea  scale  Yes       Intervention  Provide education and explanation on how to use Dyspnea scale       Expected Outcomes  Short Term: Able to use Dyspnea scale daily in rehab to express subjective sense of shortness of breath during exertion;Long Term: Able to use Dyspnea scale to guide intensity level when exercising independently       Knowledge and understanding of Target Heart Rate Range (THRR)  Yes       Intervention  Provide education and explanation of THRR including how the numbers were predicted and where they are located for reference       Expected Outcomes  Short Term: Able to state/look up THRR;Short Term: Able to use daily as guideline for intensity in rehab;Long Term: Able to use THRR to govern intensity when exercising independently       Able to check pulse independently  Yes       Intervention  Provide education and demonstration on how to check pulse in carotid and radial arteries.;Review the importance of being able to check your own pulse for safety during independent exercise       Expected Outcomes  Short Term: Able to explain why pulse checking is important during independent exercise;Long Term: Able to check pulse independently and accurately       Understanding of Exercise Prescription  Yes       Intervention  Provide education, explanation, and written materials on patient's individual exercise prescription       Expected Outcomes  Short Term: Able to explain program exercise prescription;Long Term: Able to explain home exercise prescription to exercise independently          Exercise Goals Re-Evaluation : Exercise Goals Re-Evaluation    Row Name 07/10/18 1701  Exercise Goal Re-Evaluation   Exercise Goals Review  Increase Physical Activity;Increase Strength and Stamina;Able to understand and use rate of perceived exertion (RPE) scale;Knowledge and understanding of Target Heart Rate Range (THRR)       Comments   First full day of exercise!  Patient was  oriented to gym and equipment including functions, settings, policies, and procedures.  Patient's individual exercise prescription and treatment plan were reviewed.  All starting workloads were established based on the results of the 6 minute walk test done at initial orientation visit.  The plan for exercise progression was also introduced and progression will be customized based on patient's performance and goals.       Expected Outcomes  Short - use RPE to regulate intensity during sessions Long - follow program prescription          Discharge Exercise Prescription (Final Exercise Prescription Changes): Exercise Prescription Changes - 07/11/18 1500      Response to Exercise   Blood Pressure (Admit)  122/60    Blood Pressure (Exercise)  130/58    Blood Pressure (Exit)  132/74    Heart Rate (Admit)  56 bpm    Heart Rate (Exercise)  88 bpm    Heart Rate (Exit)  78 bpm    Rating of Perceived Exertion (Exercise)  12    Comments  first session    Duration  Continue with 45 min of aerobic exercise without signs/symptoms of physical distress.    Intensity  THRR unchanged      Progression   Progression  Continue to progress workloads to maintain intensity without signs/symptoms of physical distress.    Average METs  2.98      Resistance Training   Training Prescription  Yes    Weight  2 lb    Reps  10-15      Recumbant Bike   Level  5    RPM  60    Watts  25    Minutes  15    METs  2.98       Nutrition:  Target Goals: Understanding of nutrition guidelines, daily intake of sodium <1555m, cholesterol <2056m calories 30% from fat and 7% or less from saturated fats, daily to have 5 or more servings of fruits and vegetables.  Biometrics: Pre Biometrics - 07/06/18 1426      Pre Biometrics   Height  5' 6.5" (1.689 m)    Weight  172 lb 12.8 oz (78.4 kg)    Waist Circumference  36.5 inches    Hip Circumference  42 inches    Waist to Hip Ratio  0.87 %    BMI (Calculated)  27.48     Single Leg Stand  4 seconds        Nutrition Therapy Plan and Nutrition Goals: Nutrition Therapy & Goals - 07/17/18 1632      Nutrition Therapy   Diet  TLC    Drug/Food Interactions  Statins/Certain Fruits    Protein (specify units)  7-8oz   CKD stg III   Fiber  30 grams    Whole Grain Foods  3 servings    Saturated Fats  14 max. grams    Fruits and Vegetables  5 servings/day   8 ideal, does not eat fruit daily   Sodium  1500 grams      Personal Nutrition Goals   Nutrition Goal  Include servings of fruit more often, ideally every day. You can start by consistently adding a banana to your  morning Cheerios    Personal Goal #2  Monitor/ limit larger portions of protein foods, as these foods make your kidneys work harder   dx of ckd   Personal Goal #3  Limit sodium intake, and be consistent about the amount of fluid you drink to prevent fluid retention and control blood pressure    Comments  His current diet consists of a variety of protein sources and vegetables but does not eat fruit consistently. He no longer cooks for himself, often bringing home deli plates consisting of a meat + 2 vegetables which he splits between lunch and supper. He does not add salt to food      Intervention Plan   Intervention  Prescribe, educate and counsel regarding individualized specific dietary modifications aiming towards targeted core components such as weight, hypertension, lipid management, diabetes, heart failure and other comorbidities.;Nutrition handout(s) given to patient.   Sodium food swaps handout   Expected Outcomes  Short Term Goal: Understand basic principles of dietary content, such as calories, fat, sodium, cholesterol and nutrients.;Short Term Goal: A plan has been developed with personal nutrition goals set during dietitian appointment.;Long Term Goal: Adherence to prescribed nutrition plan.       Nutrition Assessments: Nutrition Assessments - 07/06/18 1452      MEDFICTS Scores    Pre Score  18       Nutrition Goals Re-Evaluation: Nutrition Goals Re-Evaluation    Viola Name 07/17/18 1641             Goals   Nutrition Goal  Include servings of fruit more often, ideally every day. You can start by consistently adding a banana to your morning Cheerios       Comment  He currently eats fruit ~3 days/ week. Typically chooses an apple or a banana       Expected Outcome  He will consume at least 1 serving of fruit daily         Personal Goal #2 Re-Evaluation   Personal Goal #2  Monitor/ limit larger portions of protein foods, as these foods make your kidneys work harder         Personal Goal #3 Re-Evaluation   Personal Goal #3  Limit sodium intake, and be consistent about the amount of fluid you drink to prevent fluid retention and control blood pressure          Nutrition Goals Discharge (Final Nutrition Goals Re-Evaluation): Nutrition Goals Re-Evaluation - 07/17/18 1641      Goals   Nutrition Goal  Include servings of fruit more often, ideally every day. You can start by consistently adding a banana to your morning Cheerios    Comment  He currently eats fruit ~3 days/ week. Typically chooses an apple or a banana    Expected Outcome  He will consume at least 1 serving of fruit daily      Personal Goal #2 Re-Evaluation   Personal Goal #2  Monitor/ limit larger portions of protein foods, as these foods make your kidneys work harder      Personal Goal #3 Re-Evaluation   Personal Goal #3  Limit sodium intake, and be consistent about the amount of fluid you drink to prevent fluid retention and control blood pressure       Psychosocial: Target Goals: Acknowledge presence or absence of significant depression and/or stress, maximize coping skills, provide positive support system. Participant is able to verbalize types and ability to use techniques and skills needed for reducing stress and depression.  Initial Review & Psychosocial Screening: Initial Psych Review &  Screening - 07/06/18 1453      Initial Review   Current issues with  Current Sleep Concerns;Current Stress Concerns    Comments  Azan is getting back to being independent living at home. 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 Now he just wants to get back to doing whatever he pleases.       Family Dynamics   Good Support System?  Yes   children, family     Barriers   Psychosocial barriers to participate in program  There are no identifiable barriers or psychosocial needs.;The patient should benefit from training in stress management and relaxation.      Screening Interventions   Interventions  Encouraged to exercise;Provide feedback about the scores to participant;Program counselor consult;To provide support and resources with identified psychosocial needs    Expected Outcomes  Short Term goal: Utilizing psychosocial counselor, staff and physician to assist with identification of specific Stressors or current issues interfering with healing process. Setting desired goal for each stressor or current issue identified.;Long Term Goal: Stressors or current issues are controlled or eliminated.;Short Term goal: Identification and review with participant of any Quality of Life or Depression concerns found by scoring the questionnaire.;Long Term goal: The participant improves quality of Life and PHQ9 Scores as seen by post scores and/or verbalization of changes       Quality of Life Scores:  Quality of Life - 07/06/18 1454      Quality of Life   Select  Quality of Life      Quality of Life Scores   Health/Function Pre  28.08 %    Socioeconomic Pre  30 %    Psych/Spiritual Pre  30 %    Family Pre  30 %    GLOBAL Pre  28.92 %      Scores of 19 and below usually indicate a poorer quality of life in these areas.  A difference of  2-3 points is a clinically meaningful difference.  A difference of 2-3  points in the total score of the Quality of Life Index has been associated with significant improvement in overall quality of life, self-image, physical symptoms, and general health in studies assessing change in quality of life.  PHQ-9: Recent Review Flowsheet Data    Depression screen [facility] 2/9 07/06/2018   Decreased Interest 0   Down, Depressed, Hopeless 0   PHQ - 2 Score 0   Altered sleeping 1   Tired, decreased energy 0   Change in appetite 0   Feeling bad or failure about yourself  0   Trouble concentrating 0   Moving slowly or fidgety/restless 0   Suicidal thoughts 0   PHQ-9 Score 1   Difficult doing work/chores Not difficult at all     Interpretation of Total Score  Total Score Depression Severity:  1-4 = Minimal depression, 5-9 = Mild depression, 10-14 = Moderate depression, 15-19 = Moderately severe depression, 20-27 = Severe depression   Psychosocial Evaluation and Intervention: Psychosocial Evaluation - 07/10/18 1706      Psychosocial Evaluation & Interventions   Interventions  Encouraged to exercise with the program and follow exercise prescription    Comments  Counselor met with Mr. Friday [facility]) today for initial psychosocial evaluation.  He will be 82 years old later this week and reports having a CABGx3; as well as aorta  and mitral valves replaced this past March.  Jadiel has a strong support system with a son and daughter-in-law locally; friends and a church community.  He reports sleeping better this past week and an improved appetite.  He denies a history of depression or anxiety or any current symptoms and states he is typically in a positive mood.  Jerzy has goals to return to normal activities and get his strength and stamina back to do so.  Staff will follow with him.     Expected Outcomes  Short:  Arad will begin to exercise regularly to regain his stamina and strength.   Long:  Hewitt will develop a habit of exercise to maintain his strength and energy to be able  to participate in normal activities.      Continue Psychosocial Services   Follow up required by staff       Psychosocial Re-Evaluation:   Psychosocial Discharge (Final Psychosocial Re-Evaluation):   Vocational Rehabilitation: Provide vocational rehab assistance to qualifying candidates.   Vocational Rehab Evaluation & Intervention: Vocational Rehab - 07/06/18 1500      Initial Vocational Rehab Evaluation & Intervention   Assessment shows need for Vocational Rehabilitation  No       Education: Education Goals: Education classes will be provided on a variety of topics geared toward better understanding of heart health and risk factor modification. Participant will state understanding/return demonstration of topics presented as noted by education test scores.  Learning Barriers/Preferences: Learning Barriers/Preferences - 07/06/18 1457      Learning Barriers/Preferences   Learning Barriers  None    Learning Preferences  Group Instruction       Education Topics:  AED/CPR: - Group verbal and written instruction with the use of models to demonstrate the basic use of the AED with the basic ABC's of resuscitation.   General Nutrition Guidelines/Fats and Fiber: -Group instruction provided by verbal, written material, models and posters to present the general guidelines for heart healthy nutrition. Gives an explanation and review of dietary fats and fiber.   Controlling Sodium/Reading Food Labels: -Group verbal and written material supporting the discussion of sodium use in heart healthy nutrition. Review and explanation with models, verbal and written materials for utilization of the food label.   Exercise Physiology & General Exercise Guidelines: - Group verbal and written instruction with models to review the exercise physiology of the cardiovascular system and associated critical values. Provides general exercise guidelines with specific guidelines to those with heart or  lung disease.    Aerobic Exercise & Resistance Training: - Gives group verbal and written instruction on the various components of exercise. Focuses on aerobic and resistive training programs and the benefits of this training and how to safely progress through these programs..   Cardiac Rehab from 07/17/2018 in [facility] Cardiac and Pulmonary Rehab  Date  07/10/18  Educator  [facility]  Instruction Review Code  1- Geologist, engineering, Balance, Mind/Body Relaxation: Provides group verbal/written instruction on the benefits of flexibility and balance training, including mind/body exercise modes such as yoga, pilates and tai chi.  Demonstration and skill practice provided.   Cardiac Rehab from 07/17/2018 in [facility] Cardiac and Pulmonary Rehab  Date  07/17/18  Educator  [facility]  Instruction Review Code  1- Verbalizes Understanding      Stress and Anxiety: - Provides group verbal and written instruction about the health risks of elevated stress and causes of high stress.  Discuss the correlation between heart/lung disease and  anxiety and treatment options. Review healthy ways to manage with stress and anxiety.   Depression: - Provides group verbal and written instruction on the correlation between heart/lung disease and depressed mood, treatment options, and the stigmas associated with seeking treatment.   Cardiac Rehab from 07/17/2018 in [facility] Cardiac and Pulmonary Rehab  Date  07/12/18  Educator  Lucianne Lei, MSW  Instruction Review Code  2- Demonstrated Understanding      Anatomy & Physiology of the Heart: - Group verbal and written instruction and models provide basic cardiac anatomy and physiology, with the coronary electrical and arterial systems. Review of Valvular disease and Heart Failure   Cardiac Procedures: - Group verbal and written instruction to review commonly prescribed medications for heart disease. Reviews the medication, class of the drug, and side effects.  Includes the steps to properly store meds and maintain the prescription regimen. (beta blockers and nitrates)   Cardiac Medications I: - Group verbal and written instruction to review commonly prescribed medications for heart disease. Reviews the medication, class of the drug, and side effects. Includes the steps to properly store meds and maintain the prescription regimen.   Cardiac Medications II: -Group verbal and written instruction to review commonly prescribed medications for heart disease. Reviews the medication, class of the drug, and side effects. (all other drug classes)    Go Sex-Intimacy & Heart Disease, Get SMART - Goal Setting: - Group verbal and written instruction through game format to discuss heart disease and the return to sexual intimacy. Provides group verbal and written material to discuss and apply goal setting through the application of the S.M.A.R.T. Method.   Other Matters of the Heart: - Provides group verbal, written materials and models to describe Stable Angina and Peripheral Artery. Includes description of the disease process and treatment options available to the cardiac patient.   Exercise & Equipment Safety: - Individual verbal instruction and demonstration of equipment use and safety with use of the equipment.   Cardiac Rehab from 07/17/2018 in [facility] Cardiac and Pulmonary Rehab  Date  07/06/18  Educator  [facility]  Instruction Review Code  1- Verbalizes Understanding      Infection Prevention: - Provides verbal and written material to individual with discussion of infection control including proper hand washing and proper equipment cleaning during exercise session.   Cardiac Rehab from 07/17/2018 in [facility] Cardiac and Pulmonary Rehab  Date  07/06/18  Educator  [facility]  Instruction Review Code  1- Verbalizes Understanding      Falls Prevention: - Provides verbal and written material to individual with discussion of falls prevention and safety.   Cardiac Rehab  from 07/17/2018 in [facility] Cardiac and Pulmonary Rehab  Date  07/06/18  Educator  [facility]  Instruction Review Code  1- Verbalizes Understanding      Diabetes: - Individual verbal and written instruction to review signs/symptoms of diabetes, desired ranges of glucose level fasting, after meals and with exercise. Acknowledge that pre and post exercise glucose checks will be done for 3 sessions at entry of program.   Know Your Numbers and Risk Factors: -Group verbal and written instruction about important numbers in your health.  Discussion of what are risk factors and how they play a role in the disease process.  Review of Cholesterol, Blood Pressure, Diabetes, and BMI and the role they play in your overall health.   Sleep Hygiene: -Provides group verbal and written instruction about how sleep can affect your health.  Define sleep hygiene, discuss sleep cycles and  impact of sleep habits. Review good sleep hygiene tips.    Other: -Provides group and verbal instruction on various topics (see comments)   Knowledge Questionnaire Score: Knowledge Questionnaire Score - 07/06/18 1457      Knowledge Questionnaire Score   Pre Score  15/26   correct answers reviewed with Mortimer Fries, focus on nutrition, exercise      Core Components/Risk Factors/Patient Goals at Admission: Personal Goals and Risk Factors at Admission - 07/06/18 1446      Core Components/Risk Factors/Patient Goals on Admission    Weight Management  Yes;Weight Maintenance    Intervention  Weight Management: Develop a combined nutrition and exercise program designed to reach desired caloric intake, while maintaining appropriate intake of nutrient and fiber, sodium and fats, and appropriate energy expenditure required for the weight goal.;Weight Management: Provide education and appropriate resources to help participant work on and attain dietary goals.    Admit Weight  170 lb (77.1 kg)    Expected Outcomes  Long Term: Adherence to nutrition  and physical activity/exercise program aimed toward attainment of established weight goal;Weight Maintenance: Understanding of the daily nutrition guidelines, which includes 25-35% calories from fat, 7% or less cal from saturated fats, less than 281m cholesterol, less than 1.5gm of sodium, & 5 or more servings of fruits and vegetables daily;Short Term: Continue to assess and modify interventions until short term weight is achieved;Understanding recommendations for meals to include 15-35% energy as protein, 25-35% energy from fat, 35-60% energy from carbohydrates, less than 2048mof dietary cholesterol, 20-35 gm of total fiber daily;Understanding of distribution of calorie intake throughout the day with the consumption of 4-5 meals/snacks    Hypertension  Yes    Intervention  Provide education on lifestyle modifcations including regular physical activity/exercise, weight management, moderate sodium restriction and increased consumption of fresh fruit, vegetables, and low fat dairy, alcohol moderation, and smoking cessation.;Monitor prescription use compliance.    Expected Outcomes  Short Term: Continued assessment and intervention until BP is < 140/9057mG in hypertensive participants. < 130/27m28m in hypertensive participants with diabetes, heart failure or chronic kidney disease.;Long Term: Maintenance of blood pressure at goal levels.    Lipids  Yes    Intervention  Provide education and support for participant on nutrition & aerobic/resistive exercise along with prescribed medications to achieve LDL <70mg68mL >40mg.70mExpected Outcomes  Short Term: Participant states understanding of desired cholesterol values and is compliant with medications prescribed. Participant is following exercise prescription and nutrition guidelines.;Long Term: Cholesterol controlled with medications as prescribed, with individualized exercise RX and with personalized nutrition plan. Value goals: LDL < 70mg, 35m> 40 mg.        Core Components/Risk Factors/Patient Goals Review:    Core Components/Risk Factors/Patient Goals at Discharge (Final Review):    ITP Comments: ITP Comments    Row Name 07/06/18 1443 07/19/18 0914         ITP Comments  Med Review completed. Initial ITP created. Diagnosis can be found in [facility] 3/20  30 day review completed. ITP sent to Dr. Bert KlRamonita Labing for Dr. Mark MiEmily Filbertal Director of Cardiac Rehab. Continue with ITP unless changes are made by physician  New to program         Comments: 30 day review

## 2018-07-24 DIAGNOSIS — Z951 Presence of aortocoronary bypass graft: Secondary | ICD-10-CM | POA: Diagnosis not present

## 2018-07-24 DIAGNOSIS — Z952 Presence of prosthetic heart valve: Secondary | ICD-10-CM | POA: Diagnosis not present

## 2018-07-24 NOTE — Progress Notes (Signed)
Daily Session Note  Patient Details  Name: Rodney Wiley MRN: [id number] Date of Birth: 1936/10/19 Referring Provider:     Cardiac Rehab from 07/06/2018 in [facility] Cardiac and Pulmonary Rehab  Referring Provider  [name]      Encounter Date: 07/24/2018  Check In:      Social History   Tobacco Use  Smoking Status Never Smoker  Smokeless Tobacco Never Used    Goals Met:  Independence with exercise equipment Exercise tolerated well No report of cardiac concerns or symptoms Strength training completed today  Goals Unmet:  Not Applicable  Comments: Pt able to follow exercise prescription today without complaint.  Will continue to monitor for progression.    Dr. Emily Filbert is Medical Director for [place] and [facility].

## 2018-07-26 ENCOUNTER — Encounter: Payer: Medicare HMO | Admitting: *Deleted

## 2018-07-26 DIAGNOSIS — Z951 Presence of aortocoronary bypass graft: Secondary | ICD-10-CM

## 2018-07-26 NOTE — Progress Notes (Signed)
Daily Session Note  Patient Details  Name: Rodney Wiley MRN: [id number] Date of Birth: 11-10-1936 Referring Provider:     Cardiac Rehab from 07/06/2018 in [facility] Cardiac and Pulmonary Rehab  Referring Provider  [name]      Encounter Date: 07/26/2018  Check In: Session Check In - 07/26/18 1620      Check-In   Supervising physician immediately available to respond to emergencies  See telemetry face sheet for immediately available ER MD    Location  [facility]-Cardiac & Pulmonary Rehab    Staff Present  Renita Papa, RN BSN;Carroll Enterkin, RN, Vickki Hearing, BA, ACSM CEP, Exercise Physiologist    Medication changes reported      No    Fall or balance concerns reported     No    Tobacco Cessation  No Change    Warm-up and Cool-down  Performed on first and last piece of equipment    Resistance Training Performed  Yes    VAD Patient?  No      Pain Assessment   Currently in Pain?  No/denies          Social History   Tobacco Use  Smoking Status Never Smoker  Smokeless Tobacco Never Used    Goals Met:  Independence with exercise equipment Exercise tolerated well No report of cardiac concerns or symptoms Strength training completed today  Goals Unmet:  Not Applicable  Comments: Pt able to follow exercise prescription today without complaint.  Will continue to monitor for progression.    Dr. Emily Filbert is Medical Director for [place] and [facility].

## 2018-07-31 DIAGNOSIS — Z951 Presence of aortocoronary bypass graft: Secondary | ICD-10-CM

## 2018-07-31 DIAGNOSIS — Z952 Presence of prosthetic heart valve: Secondary | ICD-10-CM | POA: Diagnosis not present

## 2018-07-31 NOTE — Progress Notes (Signed)
Daily Session Note  Patient Details  Name: Rodney Wiley MRN: [id number] Date of Birth: Nov 23, 1936 Referring Provider:     Cardiac Rehab from 07/06/2018 in [facility] Cardiac and Pulmonary Rehab  Referring Provider  [name]      Encounter Date: 07/31/2018  Check In: Session Check In - 07/31/18 1709      Check-In   Supervising physician immediately available to respond to emergencies  See telemetry face sheet for immediately available ER MD    Location  [facility]-Cardiac & Pulmonary Rehab    Staff Present  Gerlene Burdock, RN, Moises Blood, BS, ACSM CEP, Exercise Physiologist;Rohan Juenger Oletta Darter, [place], ACSM CEP, Exercise Physiologist    Medication changes reported      No    Fall or balance concerns reported     No    Warm-up and Cool-down  Performed on first and last piece of equipment    Resistance Training Performed  Yes    VAD Patient?  No    PAD/SET Patient?  No      Pain Assessment   Currently in Pain?  No/denies    Multiple Pain Sites  No          Social History   Tobacco Use  Smoking Status Never Smoker  Smokeless Tobacco Never Used    Goals Met:  Independence with exercise equipment Exercise tolerated well No report of cardiac concerns or symptoms Strength training completed today  Goals Unmet:  Not Applicable  Comments: Pt able to follow exercise prescription today without complaint.  Will continue to monitor for progression.    Dr. Emily Filbert is Medical Director for [place] and [facility].

## 2018-08-02 DIAGNOSIS — Z951 Presence of aortocoronary bypass graft: Secondary | ICD-10-CM | POA: Diagnosis not present

## 2018-08-02 DIAGNOSIS — Z952 Presence of prosthetic heart valve: Secondary | ICD-10-CM | POA: Diagnosis not present

## 2018-08-02 NOTE — Progress Notes (Signed)
Daily Session Note  Patient Details  Name: Rodney Wiley MRN: [id number] Date of Birth: 10/10/1936 Referring Provider:     Cardiac Rehab from 07/06/2018 in [facility] Cardiac and Pulmonary Rehab  Referring Provider  [name]      Encounter Date: 08/02/2018  Check In: Session Check In - 08/02/18 [place]      Check-In   Supervising physician immediately available to respond to emergencies  See telemetry face sheet for immediately available ER MD    Location  [facility]-Cardiac & Pulmonary Rehab    Staff Present  Joellyn Rued, BS, PEC;Carroll Enterkin, RN, [place], BA, ACSM CEP, Exercise Physiologist    Medication changes reported      No    Fall or balance concerns reported     No    Tobacco Cessation  No Change    Warm-up and Cool-down  Performed on first and last piece of equipment    Resistance Training Performed  Yes    VAD Patient?  No    PAD/SET Patient?  No      Pain Assessment   Currently in Pain?  No/denies    Multiple Pain Sites  No          Social History   Tobacco Use  Smoking Status Never Smoker  Smokeless Tobacco Never Used    Goals Met:  Independence with exercise equipment Exercise tolerated well No report of cardiac concerns or symptoms Strength training completed today  Goals Unmet:  Not Applicable  Comments: Pt able to follow exercise prescription today without complaint.  Will continue to monitor for progression.    Dr. Emily Filbert is Medical Director for [place] and [facility].

## 2018-08-09 ENCOUNTER — Encounter: Payer: Medicare HMO | Attending: Internal Medicine

## 2018-08-09 DIAGNOSIS — Z952 Presence of prosthetic heart valve: Secondary | ICD-10-CM | POA: Diagnosis not present

## 2018-08-09 DIAGNOSIS — Z951 Presence of aortocoronary bypass graft: Secondary | ICD-10-CM | POA: Diagnosis not present

## 2018-08-09 NOTE — Progress Notes (Signed)
Daily Session Note  Patient Details  Name: CHESKEL SILVERIO MRN: [id number] Date of Birth: [birth year] Referring Provider:     Cardiac Rehab from 07/06/2018 in [facility] Cardiac and Pulmonary Rehab  Referring Provider  [name]      Encounter Date: 08/09/2018  Check In: Session Check In - 08/09/18 1636      Check-In   Supervising physician immediately available to respond to emergencies  See telemetry face sheet for immediately available ER MD    Location  [facility]-Cardiac & Pulmonary Rehab    Staff Present  Gerlene Burdock, RN, BSN;Meredith Sherryll Burger, RN Vickki Hearing, BA, ACSM CEP, Exercise Physiologist    Medication changes reported      No    Fall or balance concerns reported     No    Warm-up and Cool-down  Performed on first and last piece of equipment    Resistance Training Performed  Yes    VAD Patient?  No    PAD/SET Patient?  No      Pain Assessment   Currently in Pain?  No/denies    Multiple Pain Sites  No          Social History   Tobacco Use  Smoking Status Never Smoker  Smokeless Tobacco Never Used    Goals Met:  Independence with exercise equipment Exercise tolerated well No report of cardiac concerns or symptoms Strength training completed today  Goals Unmet:  Not Applicable  Comments: Pt able to follow exercise prescription today without complaint.  Will continue to monitor for progression.    Dr. Emily Filbert is Medical Director for [place] and [facility].

## 2018-08-14 DIAGNOSIS — Z952 Presence of prosthetic heart valve: Secondary | ICD-10-CM | POA: Diagnosis not present

## 2018-08-14 DIAGNOSIS — Z951 Presence of aortocoronary bypass graft: Secondary | ICD-10-CM | POA: Diagnosis not present

## 2018-08-14 NOTE — Progress Notes (Signed)
Daily Session Note  Patient Details  Name: Rodney Wiley MRN: [id number] Date of Birth: 1935/12/14 Referring Provider:     Cardiac Rehab from 07/06/2018 in [facility] Cardiac and Pulmonary Rehab  Referring Provider  [name]      Encounter Date: 08/14/2018  Check In: Session Check In - 08/14/18 1719      Check-In   Supervising physician immediately available to respond to emergencies  [facility] immediately available ER MD    Location  [facility]-Cardiac & Pulmonary Rehab    Staff Present  Nada Maclachlan, BA, ACSM CEP, Exercise Physiologist;Carroll Enterkin, RN, Moises Blood, BS, ACSM CEP, Exercise Physiologist    Medication changes reported      No    Fall or balance concerns reported     No    Warm-up and Cool-down  Performed on first and last piece of equipment    Resistance Training Performed  Yes    VAD Patient?  No    PAD/SET Patient?  No      Pain Assessment   Currently in Pain?  No/denies    Multiple Pain Sites  No          Social History   Tobacco Use  Smoking Status Never Smoker  Smokeless Tobacco Never Used    Goals Met:  Independence with exercise equipment Exercise tolerated well No report of cardiac concerns or symptoms Strength training completed today  Goals Unmet:  Not Applicable  Comments: Pt able to follow exercise prescription today without complaint.  Will continue to monitor for progression.    Dr. Emily Filbert is Medical Director for [place] and [facility].

## 2018-08-16 ENCOUNTER — Encounter: Payer: Self-pay | Admitting: *Deleted

## 2018-08-16 DIAGNOSIS — Z951 Presence of aortocoronary bypass graft: Secondary | ICD-10-CM

## 2018-08-16 DIAGNOSIS — Z952 Presence of prosthetic heart valve: Secondary | ICD-10-CM | POA: Diagnosis not present

## 2018-08-16 NOTE — Progress Notes (Signed)
Daily Session Note  Patient Details  Name: Rodney Wiley MRN: [id number] Date of Birth: 1936-11-27 Referring Provider:     Cardiac Rehab from 07/06/2018 in [facility] Cardiac and Pulmonary Rehab  Referring Provider  [name]      Encounter Date: 08/16/2018  Check In: Session Check In - 08/16/18 1749      Check-In   Supervising physician immediately available to respond to emergencies  See telemetry face sheet for immediately available ER MD    Location  [facility]-Cardiac & Pulmonary Rehab    Staff Present  Renita Papa, RN Vickki Hearing, BA, ACSM CEP, Exercise Physiologist;Carroll Enterkin, RN, BSN    Medication changes reported      No    Fall or balance concerns reported     No    Warm-up and Cool-down  Performed on first and last piece of equipment    Resistance Training Performed  Yes    VAD Patient?  No    PAD/SET Patient?  No      Pain Assessment   Currently in Pain?  No/denies    Multiple Pain Sites  No          Social History   Tobacco Use  Smoking Status Never Smoker  Smokeless Tobacco Never Used    Goals Met:  Independence with exercise equipment Exercise tolerated well No report of cardiac concerns or symptoms Strength training completed today  Goals Unmet:  Not Applicable  Comments: Pt able to follow exercise prescription today without complaint.  Will continue to monitor for progression.    Dr. Emily Filbert is Medical Director for [place] and [facility].

## 2018-08-16 NOTE — Progress Notes (Signed)
Cardiac Individual Treatment Plan  Patient Details  Name: Rodney Wiley MRN: [id number] Date of Birth: Oct 27, 1936 Referring Provider:     Cardiac Rehab from 07/06/2018 in [facility] Cardiac and Pulmonary Rehab  Referring Provider  [name]      Initial Encounter Date:    Cardiac Rehab from 07/06/2018 in [facility] Cardiac and Pulmonary Rehab  Date  07/06/18      Visit Diagnosis: S/P CABG x 3  Patient's Home Medications on Admission:  Current Outpatient Medications:  .  amLODipine (NORVASC) 10 MG tablet, Take 10 mg by mouth daily., Disp: , Rfl:  .  atorvastatin (LIPITOR) 20 MG tablet, Take 1 tablet (20 mg total) by mouth daily., Disp: 90 tablet, Rfl: 3 .  carvedilol (COREG) 6.25 MG tablet, Take 1 tablet (6.25 mg total) by mouth 2 (two) times daily., Disp: 180 tablet, Rfl: 3 .  Cholecalciferol (VITAMIN D3) 2000 units capsule, Take 2,000 Units by mouth daily. , Disp: , Rfl:  .  doxazosin (CARDURA) 2 MG tablet, Take 2 mg by mouth daily., Disp: , Rfl:  .  ferrous sulfate 325 (65 FE) MG EC tablet, Take 325 mg by mouth 2 (two) times daily. , Disp: , Rfl:  .  fluticasone-salmeterol (ADVAIR HFA) 115-21 MCG/ACT inhaler, Inhale 2 puffs into the lungs as needed. Use with spacer/ Aerochamber , Disp: , Rfl:  .  folic acid-pyridoxine-cyancobalamin (FOLTX) 2.5-25-2 MG TABS tablet, Take 1 tablet by mouth daily., Disp: 30 each, Rfl:  .  sennosides-docusate sodium (SENOKOT-S) 8.6-50 MG tablet, Take 1 tablet by mouth 2 (two) times daily. , Disp: , Rfl:  .  torsemide (DEMADEX) 20 MG tablet, Take 1 tablet (20 mg total) by mouth daily as needed., Disp: 180 tablet, Rfl: 3 .  vitamin C (ASCORBIC ACID) 500 MG tablet, Take 500 mg by mouth 2 (two) times daily., Disp: , Rfl:   Past Medical History: Past Medical History:  Diagnosis Date  . Aortic insufficiency    a. 02/2018 s/p bioprosthetic AVR; 04/2018 Echo: EF 50-55%, Gr2 DD, Ao bioprosthesis, mean grad 93mHg, Ao root/Asc Ao nl in size, mild MR, mildly dil LA, nl RV fxn. Nl  PASP.  .[name] KitchenArthropathy   . Ascending aortic aneurysm ([place]    a. 12/2016 MRA: 4.3cm @ sinus of valsalva, 4.9cm above sinotubular jxn; b. 02/2018 s/p biological Bentall and AVR.  .[name] KitchenBPH (benign prostatic hyperplasia)   . 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 02/2018 Cath: LAD 20ost, 40p, 722mLCX 60p, OM2 60, OM3 85, RCA 10p/m w/ L->R and R->R collats;  b. 02/2018 CABG x 3: LIMA to LAD, SVG to OM2, SVG to PDA, EVH via right thigh.  . CKD (chronic kidney disease), stage III ([facility]  . Diverticulitis   . Essential hypertension   . GERD (gastroesophageal reflux disease)   . Hemorrhoids   . History of chicken pox   . History of Helicobacter pylori infection 06/2007  . Hyperlipidemia   . Hypertension   . Left renal artery stenosis (HCC)    a. 01/2017 RA u/s: moderate L RAS.  . [name] Kitchenower extremity edema   . Nonrheumatic mitral (valve) insufficiency    a. 12/2016 Echo: mild to mod MR; b. 09/2017 TEE: mild to mod MR; c. 04/2018 Echo: Mild MR.  . [name] Kitchenonrheumatic pulmonary valve insufficiency   . 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 s/p 21 mm Edwards Inspiris Resilia stented bovine pericardial tissue valve and  24 mm Gelweave Valsalva synthetic root conduit with reimplantation of left main coronary artery.    Tobacco Use: Social History   Tobacco Use  Smoking Status Never Smoker  Smokeless Tobacco Never Used    Labs: Recent Review Flowsheet Data    Labs for ITP Cardiac and Pulmonary Rehab Latest Ref Rng & Units 02/23/2018 02/23/2018 02/23/2018 02/23/2018 02/23/2018   Cholestrol 0 - 200 mg/dL - - - - -   LDLCALC 0 - 99 mg/dL - - - - -   HDL >40 mg/dL - - - - -   Trlycerides <150 mg/dL - - - - -   Hemoglobin A1c 4.8 - 5.6 % - - - - -   PHART 7.350 - 7.450 7.345(L) 7.355 7.400 7.370 -   PCO2ART 32.0 - 48.0 mmHg 35.3 35.4 33.1 32.6 -   HCO3 20.0 - 28.0 mmol/L 19.4(L) 19.7(L) 20.3 18.8(L) -   TCO2 22 - 32 mmol/L 20(L) 21(L) 21(L) 20(L)  21(L)   ACIDBASEDEF 0.0 - 2.0 mmol/L 6.0(H) 5.0(H) 4.0(H) 6.0(H) -   O2SAT % 88.0 96.0 94.0 91.0 -       Exercise Target Goals: Exercise Program Goal: Individual exercise prescription set using results from initial 6 min walk test and THRR while considering  patient's activity barriers and safety.   Exercise Prescription Goal: Initial exercise prescription builds to 30-45 minutes a day of aerobic activity, 2-3 days per week.  Home exercise guidelines will be given to patient during program as part of exercise prescription that the participant will acknowledge.  Activity Barriers & Risk Stratification: Activity Barriers & Cardiac Risk Stratification - 07/06/18 1501      Activity Barriers & Cardiac Risk Stratification   Activity Barriers  Arthritis    Cardiac Risk Stratification  High       6 Minute Walk: 6 Minute Walk    Row Name 07/06/18 1428         6 Minute Walk   Distance  1112 feet     Walk Time  6 minutes     # of Rest Breaks  0     MPH  2.1     METS  1.77     RPE  11     Perceived Dyspnea   0     VO2 Peak  6.21     Symptoms  No     Resting HR  58 bpm     Resting BP  114/62     Resting Oxygen Saturation   95 %     Exercise Oxygen Saturation  during 6 min walk  95 %     Max Ex. HR  80 bpm     Max Ex. BP  114/58     2 Minute Post BP  104/58        Oxygen Initial Assessment:   Oxygen Re-Evaluation:   Oxygen Discharge (Final Oxygen Re-Evaluation):   Initial Exercise Prescription: Initial Exercise Prescription - 07/06/18 1400      Date of Initial Exercise RX and Referring Provider   Date  07/06/18    Referring Provider  [name]      Treadmill   MPH  1.5    Grade  0    Minutes  15    METs  2.15      Recumbant Bike   Level  2    RPM  60    Watts  5    Minutes  15    METs  1.8  NuStep   Level  2    SPM  80    Minutes  15    METs  2      Prescription Details   Frequency (times per week)  3    Duration  Progress to 45 minutes of  aerobic exercise without signs/symptoms of physical distress      Intensity   THRR 40-80% of Max Heartrate  91-124    Ratings of Perceived Exertion  11-13    Perceived Dyspnea  0-4      Resistance Training   Training Prescription  Yes    Weight  2 lb    Reps  10-15       Perform Capillary Blood Glucose checks as needed.  Exercise Prescription Changes: Exercise Prescription Changes    Row Name 07/06/18 1400 07/11/18 1500 07/26/18 1200 07/26/18 1700 08/10/18 0900     Response to Exercise   Blood Pressure (Admit)  114/62  122/60  132/84  -  140/78   Blood Pressure (Exercise)  114/58  130/58  158/64  -  146/70   Blood Pressure (Exit)  104/58  132/74  134/72  -  130/66   Heart Rate (Admit)  57 bpm  56 bpm  76 bpm  -  61 bpm   Heart Rate (Exercise)  77 bpm  88 bpm  98 bpm  -  108 bpm   Heart Rate (Exit)  80 bpm  78 bpm  79 bpm  -  71 bpm   Oxygen Saturation (Admit)  98 %  -  -  -  -   Oxygen Saturation (Exercise)  96 %  -  -  -  -   Oxygen Saturation (Exit)  97 %  -  -  -  -   Rating of Perceived Exertion (Exercise)  '11  12  15  '$ -  14   Symptoms  -  -  -  -  none   Comments  -  first session  -  -  -   Duration  -  Continue with 45 min of aerobic exercise without signs/symptoms of physical distress.  Continue with 45 min of aerobic exercise without signs/symptoms of physical distress.  -  Continue with 45 min of aerobic exercise without signs/symptoms of physical distress.   Intensity  -  THRR unchanged  THRR unchanged  -  THRR unchanged     Progression   Progression  -  Continue to progress workloads to maintain intensity without signs/symptoms of physical distress.  Continue to progress workloads to maintain intensity without signs/symptoms of physical distress.  -  Continue to progress workloads to maintain intensity without signs/symptoms of physical distress.   Average METs  -  2.98  2.5  -  2.7     Resistance Training   Training Prescription  -  Yes  Yes  -  Yes   Weight  -   2 lb  3 lb  -  3 lb   Reps  -  10-15  10-15  -  10-15     Interval Training   Interval Training  -  -  No  -  No     Treadmill   MPH  -  -  1.5  -  1.5   Grade  -  -  0  -  0   Minutes  -  -  15  -  15   METs  -  -  2.15  -  2.15     Recumbant Bike   Level  -  5  3  -  3   RPM  -  60  60  -  60   Watts  -  25  29  -  30   Minutes  -  15  15  -  15   METs  -  2.98  2.98  -  3.18     Home Exercise Plan   Plans to continue exercise at  -  -  -  Home (comment) walk and handweights  Home (comment) walk and handweights   Frequency  -  -  -  Add 2 additional days to program exercise sessions.  Add 2 additional days to program exercise sessions.   Initial Home Exercises Provided  -  -  -  07/26/18  07/26/18      Exercise Comments: Exercise Comments    Row Name 07/10/18 1700           Exercise Comments   First full day of exercise!  Patient was oriented to gym and equipment including functions, settings, policies, and procedures.  Patient's individual exercise prescription and treatment plan were reviewed.  All starting workloads were established based on the results of the 6 minute walk test done at initial orientation visit.  The plan for exercise progression was also introduced and progression will be customized based on patient's performance and goals.          Exercise Goals and Review: Exercise Goals    Row Name 07/06/18 1427             Exercise Goals   Increase Physical Activity  Yes       Intervention  Provide advice, education, support and counseling about physical activity/exercise needs.;Develop an individualized exercise prescription for aerobic and resistive training based on initial evaluation findings, risk stratification, comorbidities and participant's personal goals.       Expected Outcomes  Short Term: Attend rehab on a regular basis to increase amount of physical activity.;Long Term: Add in home exercise to make exercise part of routine and to increase  amount of physical activity.;Long Term: Exercising regularly at least 3-5 days a week.       Increase Strength and Stamina  Yes       Intervention  Provide advice, education, support and counseling about physical activity/exercise needs.;Develop an individualized exercise prescription for aerobic and resistive training based on initial evaluation findings, risk stratification, comorbidities and participant's personal goals.       Expected Outcomes  Short Term: Increase workloads from initial exercise prescription for resistance, speed, and METs.;Short Term: Perform resistance training exercises routinely during rehab and add in resistance training at home;Long Term: Improve cardiorespiratory fitness, muscular endurance and strength as measured by increased METs and functional capacity (6MWT)       Able to understand and use rate of perceived exertion (RPE) scale  Yes       Intervention  Provide education and explanation on how to use RPE scale       Expected Outcomes  Short Term: Able to use RPE daily in rehab to express subjective intensity level;Long Term:  Able to use RPE to guide intensity level when exercising independently       Able to understand and use Dyspnea scale  Yes       Intervention  Provide education and explanation on how to use Dyspnea scale       Expected Outcomes  Short Term: Able to use Dyspnea scale daily in rehab to express subjective sense of shortness of breath during exertion;Long Term: Able to use Dyspnea scale to guide intensity level when exercising independently       Knowledge and understanding of Target Heart Rate Range (THRR)  Yes       Intervention  Provide education and explanation of THRR including how the numbers were predicted and where they are located for reference       Expected Outcomes  Short Term: Able to state/look up THRR;Short Term: Able to use daily as guideline for intensity in rehab;Long Term: Able to use THRR to govern intensity when exercising  independently       Able to check pulse independently  Yes       Intervention  Provide education and demonstration on how to check pulse in carotid and radial arteries.;Review the importance of being able to check your own pulse for safety during independent exercise       Expected Outcomes  Short Term: Able to explain why pulse checking is important during independent exercise;Long Term: Able to check pulse independently and accurately       Understanding of Exercise Prescription  Yes       Intervention  Provide education, explanation, and written materials on patient's individual exercise prescription       Expected Outcomes  Short Term: Able to explain program exercise prescription;Long Term: Able to explain home exercise prescription to exercise independently          Exercise Goals Re-Evaluation : Exercise Goals Re-Evaluation    Row Name 07/10/18 1701 07/26/18 1241 07/26/18 1742 08/10/18 0922       Exercise Goal Re-Evaluation   Exercise Goals Review  Increase Physical Activity;Increase Strength and Stamina;Able to understand and use rate of perceived exertion (RPE) scale;Knowledge and understanding of Target Heart Rate Range (THRR)  Increase Physical Activity;Increase Strength and Stamina;Able to understand and use rate of perceived exertion (RPE) scale  Increase Physical Activity;Increase Strength and Stamina;Able to understand and use rate of perceived exertion (RPE) scale;Knowledge and understanding of Target Heart Rate Range (THRR);Able to check pulse independently  Increase Physical Activity;Increase Strength and Stamina;Able to understand and use rate of perceived exertion (RPE) scale;Knowledge and understanding of Target Heart Rate Range (THRR)    Comments   First full day of exercise!  Patient was oriented to gym and equipment including functions, settings, policies, and procedures.  Patient's individual exercise prescription and treatment plan were reviewed.  All starting workloads  were established based on the results of the 6 minute walk test done at initial orientation visit.  The plan for exercise progression was also introduced and progression will be customized based on patient's performance and goals.  Taesean is progressing well with exercise and reaches RPE goals.  HR does not quite reach THR.  Staff will monitor progress.  Harwood plans to walk at home as he has done previously.  He will gradually increase from 15-18 minutes up to 30 minutes.  He will monitor your HR and RPE while exercising at home.  Audrey has improved overall MET level.  He states he feels his legs are stronger since starting exercise.  His HR and RPE are meeting target goals.     Expected Outcomes  Short - use RPE to regulate intensity during sessions Long - follow program prescription  Short - continue to attend consistently Long - increase overall MET level  Short - Cher will gradually increase walking time  Long - Naaman will increase overall MET level  Short - add incline to TM Long - increase MET level       Discharge Exercise Prescription (Final Exercise Prescription Changes): Exercise Prescription Changes - 08/10/18 0900      Response to Exercise   Blood Pressure (Admit)  140/78    Blood Pressure (Exercise)  146/70    Blood Pressure (Exit)  130/66    Heart Rate (Admit)  61 bpm    Heart Rate (Exercise)  108 bpm    Heart Rate (Exit)  71 bpm    Rating of Perceived Exertion (Exercise)  14    Symptoms  none    Duration  Continue with 45 min of aerobic exercise without signs/symptoms of physical distress.    Intensity  THRR unchanged      Progression   Progression  Continue to progress workloads to maintain intensity without signs/symptoms of physical distress.    Average METs  2.7      Resistance Training   Training Prescription  Yes    Weight  3 lb    Reps  10-15      Interval Training   Interval Training  No      Treadmill   MPH  1.5    Grade  0    Minutes  15    METs  2.15       Recumbant Bike   Level  3    RPM  60    Watts  30    Minutes  15    METs  3.18      Home Exercise Plan   Plans to continue exercise at  Home (comment)   walk and handweights   Frequency  Add 2 additional days to program exercise sessions.    Initial Home Exercises Provided  07/26/18       Nutrition:  Target Goals: Understanding of nutrition guidelines, daily intake of sodium '1500mg'$ , cholesterol '200mg'$ , calories 30% from fat and 7% or less from saturated fats, daily to have 5 or more servings of fruits and vegetables.  Biometrics: Pre Biometrics - 07/06/18 1426      Pre Biometrics   Height  5' 6.5" (1.689 m)    Weight  172 lb 12.8 oz (78.4 kg)    Waist Circumference  36.5 inches    Hip Circumference  42 inches    Waist to Hip Ratio  0.87 %    BMI (Calculated)  27.48    Single Leg Stand  4 seconds        Nutrition Therapy Plan and Nutrition Goals: Nutrition Therapy & Goals - 07/17/18 1632      Nutrition Therapy   Diet  TLC    Drug/Food Interactions  Statins/Certain Fruits    Protein (specify units)  7-8oz   CKD stg III   Fiber  30 grams    Whole Grain Foods  3 servings    Saturated Fats  14 max. grams    Fruits and Vegetables  5 servings/day   8 ideal, does not eat fruit daily   Sodium  1500 grams      Personal Nutrition Goals   Nutrition Goal  Include servings of fruit more often, ideally every day. You can start by consistently adding a banana to your morning Cheerios    Personal Goal #2  Monitor/ limit larger portions of protein foods, as these foods make your kidneys work harder   dx of ckd   Personal Goal #3  Limit sodium intake, and be consistent about the amount of fluid you drink to prevent fluid retention and control blood pressure    Comments  His current diet consists of a variety of protein sources and vegetables but does not eat fruit consistently. He no longer cooks for himself, often bringing home deli plates consisting of a meat + 2 vegetables  which he splits between lunch and supper. He does not add salt to food      Intervention Plan   Intervention  Prescribe, educate and counsel regarding individualized specific dietary modifications aiming towards targeted core components such as weight, hypertension, lipid management, diabetes, heart failure and other comorbidities.;Nutrition handout(s) given to patient.   Sodium food swaps handout   Expected Outcomes  Short Term Goal: Understand basic principles of dietary content, such as calories, fat, sodium, cholesterol and nutrients.;Short Term Goal: A plan has been developed with personal nutrition goals set during dietitian appointment.;Long Term Goal: Adherence to prescribed nutrition plan.       Nutrition Assessments: Nutrition Assessments - 07/06/18 1452      MEDFICTS Scores   Pre Score  18       Nutrition Goals Re-Evaluation: Nutrition Goals Re-Evaluation    Row Name 07/17/18 1641 08/09/18 1719           Goals   Nutrition Goal  Include servings of fruit more often, ideally every day. You can start by consistently adding a banana to your morning Cheerios  Include servings of fruit more often, ideally daily; Monitor and moderate intake of protein and sodium d/t CKD and heart disease / HTN      Comment  He currently eats fruit ~3 days/ week. Typically chooses an apple or a banana  He has been increasing his fruit intake, including options like apples and bananas more regularly. He has also been more conscious of sodium and protein      Expected Outcome  He will consume at least 1 serving of fruit daily  He will continue to increast his fruit intake until he is eating multiple servings per day; he will continue to monitor and moderate protein and sodium intake        Personal Goal #2 Re-Evaluation   Personal Goal #2  Monitor/ limit larger portions of protein foods, as these foods make your kidneys work harder  -        Personal Goal #3 Re-Evaluation   Personal Goal #3  Limit  sodium intake, and be consistent about the amount of fluid you drink to prevent fluid retention and control blood pressure  -         Nutrition Goals Discharge (Final Nutrition Goals Re-Evaluation): Nutrition Goals Re-Evaluation - 08/09/18 1719      Goals   Nutrition Goal  Include servings of fruit more often, ideally daily; Monitor and moderate intake of protein and sodium d/t CKD and heart disease / HTN    Comment  He has been increasing his fruit intake, including options like apples and bananas more regularly. He has also been more conscious of sodium and protein    Expected Outcome  He will continue to increast his fruit intake until he is eating multiple servings per day; he will continue to monitor and moderate protein and sodium intake       Psychosocial: Target Goals: Acknowledge presence or absence of significant depression and/or stress, maximize coping skills, provide positive support system. Participant is able to verbalize types and ability to use techniques and skills  needed for reducing stress and depression.   Initial Review & Psychosocial Screening: Initial Psych Review & Screening - 07/06/18 1453      Initial Review   Current issues with  Current Sleep Concerns;Current Stress Concerns    Comments  Clete is getting back to being independent living at home. 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 Now he just wants to get back to doing whatever he pleases.       Family Dynamics   Good Support System?  Yes   children, family     Barriers   Psychosocial barriers to participate in program  There are no identifiable barriers or psychosocial needs.;The patient should benefit from training in stress management and relaxation.      Screening Interventions   Interventions  Encouraged to exercise;Provide feedback about the scores to participant;Program counselor consult;To provide  support and resources with identified psychosocial needs    Expected Outcomes  Short Term goal: Utilizing psychosocial counselor, staff and physician to assist with identification of specific Stressors or current issues interfering with healing process. Setting desired goal for each stressor or current issue identified.;Long Term Goal: Stressors or current issues are controlled or eliminated.;Short Term goal: Identification and review with participant of any Quality of Life or Depression concerns found by scoring the questionnaire.;Long Term goal: The participant improves quality of Life and PHQ9 Scores as seen by post scores and/or verbalization of changes       Quality of Life Scores:  Quality of Life - 07/06/18 1454      Quality of Life   Select  Quality of Life      Quality of Life Scores   Health/Function Pre  28.08 %    Socioeconomic Pre  30 %    Psych/Spiritual Pre  30 %    Family Pre  30 %    GLOBAL Pre  28.92 %      Scores of 19 and below usually indicate a poorer quality of life in these areas.  A difference of  2-3 points is a clinically meaningful difference.  A difference of 2-3 points in the total score of the Quality of Life Index has been associated with significant improvement in overall quality of life, self-image, physical symptoms, and general health in studies assessing change in quality of life.  PHQ-9: Recent Review Flowsheet Data    Depression screen [facility] 2/9 07/06/2018   Decreased Interest 0   Down, Depressed, Hopeless 0   PHQ - 2 Score 0   Altered sleeping 1   Tired, decreased energy 0   Change in appetite 0   Feeling bad or failure about yourself  0   Trouble concentrating 0   Moving slowly or fidgety/restless 0   Suicidal thoughts 0   PHQ-9 Score 1   Difficult doing work/chores Not difficult at all     Interpretation of Total Score  Total Score Depression Severity:  1-4 = Minimal depression, 5-9 = Mild depression, 10-14 = Moderate depression, 15-19 =  Moderately severe depression, 20-27 = Severe depression   Psychosocial Evaluation and Intervention: Psychosocial Evaluation - 07/10/18 1706      Psychosocial Evaluation & Interventions   Interventions  Encouraged to exercise with the program and follow exercise prescription    Comments  Counselor met with Mr. Holness [facility]) today for initial psychosocial evaluation.  He will be 82 years old later this week and  reports having a CABGx3; as well as aorta and mitral valves replaced this past March.  Tsuneo has a strong support system with a son and daughter-in-law locally; friends and a church community.  He reports sleeping better this past week and an improved appetite.  He denies a history of depression or anxiety or any current symptoms and states he is typically in a positive mood.  Raekwan has goals to return to normal activities and get his strength and stamina back to do so.  Staff will follow with him.     Expected Outcomes  Short:  Mars will begin to exercise regularly to regain his stamina and strength.   Long:  Neema will develop a habit of exercise to maintain his strength and energy to be able to participate in normal activities.      Continue Psychosocial Services   Follow up required by staff       Psychosocial Re-Evaluation:   Psychosocial Discharge (Final Psychosocial Re-Evaluation):   Vocational Rehabilitation: Provide vocational rehab assistance to qualifying candidates.   Vocational Rehab Evaluation & Intervention: Vocational Rehab - 07/06/18 1500      Initial Vocational Rehab Evaluation & Intervention   Assessment shows need for Vocational Rehabilitation  No       Education: Education Goals: Education classes will be provided on a variety of topics geared toward better understanding of heart health and risk factor modification. Participant will state understanding/return demonstration of topics presented as noted by education test scores.  Learning  Barriers/Preferences: Learning Barriers/Preferences - 07/06/18 1457      Learning Barriers/Preferences   Learning Barriers  None    Learning Preferences  Group Instruction       Education Topics:  AED/CPR: - Group verbal and written instruction with the use of models to demonstrate the basic use of the AED with the basic ABC's of resuscitation.   General Nutrition Guidelines/Fats and Fiber: -Group instruction provided by verbal, written material, models and posters to present the general guidelines for heart healthy nutrition. Gives an explanation and review of dietary fats and fiber.   Cardiac Rehab from 08/14/2018 in [facility] Cardiac and Pulmonary Rehab  Date  08/14/18  Educator  LB  Instruction Review Code  1- Verbalizes Understanding      Controlling Sodium/Reading Food Labels: -Group verbal and written material supporting the discussion of sodium use in heart healthy nutrition. Review and explanation with models, verbal and written materials for utilization of the food label.   Exercise Physiology & General Exercise Guidelines: - Group verbal and written instruction with models to review the exercise physiology of the cardiovascular system and associated critical values. Provides general exercise guidelines with specific guidelines to those with heart or lung disease.    Aerobic Exercise & Resistance Training: - Gives group verbal and written instruction on the various components of exercise. Focuses on aerobic and resistive training programs and the benefits of this training and how to safely progress through these programs..   Cardiac Rehab from 08/14/2018 in [facility] Cardiac and Pulmonary Rehab  Date  07/10/18  Educator  [facility]  Instruction Review Code  1- Geologist, engineering, Balance, Mind/Body Relaxation: Provides group verbal/written instruction on the benefits of flexibility and balance training, including mind/body exercise modes such as yoga, pilates and  tai chi.  Demonstration and skill practice provided.   Cardiac Rehab from 08/14/2018 in [facility] Cardiac and Pulmonary Rehab  Date  07/17/18  Educator  AS  Instruction Review Code  1- Verbalizes Understanding      Stress and Anxiety: - Provides group verbal and written instruction about the health risks of elevated stress and causes of high stress.  Discuss the correlation between heart/lung disease and anxiety and treatment options. Review healthy ways to manage with stress and anxiety.   Cardiac Rehab from 08/14/2018 in [facility] Cardiac and Pulmonary Rehab  Date  07/26/18  Educator  [facility]  Instruction Review Code  1- Verbalizes Understanding      Depression: - Provides group verbal and written instruction on the correlation between heart/lung disease and depressed mood, treatment options, and the stigmas associated with seeking treatment.   Cardiac Rehab from 08/14/2018 in [facility] Cardiac and Pulmonary Rehab  Date  07/12/18  Educator  Lucianne Lei, MSW  Instruction Review Code  2- Demonstrated Understanding      Anatomy & Physiology of the Heart: - Group verbal and written instruction and models provide basic cardiac anatomy and physiology, with the coronary electrical and arterial systems. Review of Valvular disease and Heart Failure   Cardiac Procedures: - Group verbal and written instruction to review commonly prescribed medications for heart disease. Reviews the medication, class of the drug, and side effects. Includes the steps to properly store meds and maintain the prescription regimen. (beta blockers and nitrates)   Cardiac Medications I: - Group verbal and written instruction to review commonly prescribed medications for heart disease. Reviews the medication, class of the drug, and side effects. Includes the steps to properly store meds and maintain the prescription regimen.   Cardiac Rehab from 08/14/2018 in [facility] Cardiac and Pulmonary Rehab  Date  07/31/18  Educator  CE  Instruction  Review Code  1- Verbalizes Understanding      Cardiac Medications II: -Group verbal and written instruction to review commonly prescribed medications for heart disease. Reviews the medication, class of the drug, and side effects. (all other drug classes)   Cardiac Rehab from 08/14/2018 in [facility] Cardiac and Pulmonary Rehab  Date  07/19/18  Educator  [facility]  Instruction Review Code  1- Verbalizes Understanding       Go Sex-Intimacy & Heart Disease, Get SMART - Goal Setting: - Group verbal and written instruction through game format to discuss heart disease and the return to sexual intimacy. Provides group verbal and written material to discuss and apply goal setting through the application of the S.M.A.R.T. Method.   Other Matters of the Heart: - Provides group verbal, written materials and models to describe Stable Angina and Peripheral Artery. Includes description of the disease process and treatment options available to the cardiac patient.   Exercise & Equipment Safety: - Individual verbal instruction and demonstration of equipment use and safety with use of the equipment.   Cardiac Rehab from 08/14/2018 in [facility] Cardiac and Pulmonary Rehab  Date  07/06/18  Educator  [facility]  Instruction Review Code  1- Verbalizes Understanding      Infection Prevention: - Provides verbal and written material to individual with discussion of infection control including proper hand washing and proper equipment cleaning during exercise session.   Cardiac Rehab from 08/14/2018 in [facility] Cardiac and Pulmonary Rehab  Date  07/06/18  Educator  [facility]  Instruction Review Code  1- Verbalizes Understanding      Falls Prevention: - Provides verbal and written material to individual with discussion of falls prevention and safety.   Cardiac Rehab from 08/14/2018 in [facility] Cardiac and Pulmonary Rehab  Date  07/06/18  Educator  [facility]  Instruction Review  Code  1- Verbalizes Understanding      Diabetes: - Individual verbal and  written instruction to review signs/symptoms of diabetes, desired ranges of glucose level fasting, after meals and with exercise. Acknowledge that pre and post exercise glucose checks will be done for 3 sessions at entry of program.   Know Your Numbers and Risk Factors: -Group verbal and written instruction about important numbers in your health.  Discussion of what are risk factors and how they play a role in the disease process.  Review of Cholesterol, Blood Pressure, Diabetes, and BMI and the role they play in your overall health.   Cardiac Rehab from 08/14/2018 in [facility] Cardiac and Pulmonary Rehab  Date  07/19/18  Educator  [facility]  Instruction Review Code  1- Verbalizes Understanding      Sleep Hygiene: -Provides group verbal and written instruction about how sleep can affect your health.  Define sleep hygiene, discuss sleep cycles and impact of sleep habits. Review good sleep hygiene tips.    Cardiac Rehab from 08/14/2018 in [facility] Cardiac and Pulmonary Rehab  Date  08/09/18  Educator  [facility]  Instruction Review Code  1- Verbalizes Understanding      Other: -Provides group and verbal instruction on various topics (see comments)   Knowledge Questionnaire Score: Knowledge Questionnaire Score - 07/06/18 1457      Knowledge Questionnaire Score   Pre Score  15/26   correct answers reviewed with Mortimer Fries, focus on nutrition, exercise      Core Components/Risk Factors/Patient Goals at Admission: Personal Goals and Risk Factors at Admission - 07/06/18 1446      Core Components/Risk Factors/Patient Goals on Admission    Weight Management  Yes;Weight Maintenance    Intervention  Weight Management: Develop a combined nutrition and exercise program designed to reach desired caloric intake, while maintaining appropriate intake of nutrient and fiber, sodium and fats, and appropriate energy expenditure required for the weight goal.;Weight Management: Provide education and appropriate resources to help  participant work on and attain dietary goals.    Admit Weight  170 lb (77.1 kg)    Expected Outcomes  Long Term: Adherence to nutrition and physical activity/exercise program aimed toward attainment of established weight goal;Weight Maintenance: Understanding of the daily nutrition guidelines, which includes 25-35% calories from fat, 7% or less cal from saturated fats, less than '200mg'$  cholesterol, less than 1.5gm of sodium, & 5 or more servings of fruits and vegetables daily;Short Term: Continue to assess and modify interventions until short term weight is achieved;Understanding recommendations for meals to include 15-35% energy as protein, 25-35% energy from fat, 35-60% energy from carbohydrates, less than '200mg'$  of dietary cholesterol, 20-35 gm of total fiber daily;Understanding of distribution of calorie intake throughout the day with the consumption of 4-5 meals/snacks    Hypertension  Yes    Intervention  Provide education on lifestyle modifcations including regular physical activity/exercise, weight management, moderate sodium restriction and increased consumption of fresh fruit, vegetables, and low fat dairy, alcohol moderation, and smoking cessation.;Monitor prescription use compliance.    Expected Outcomes  Short Term: Continued assessment and intervention until BP is < 140/57m HG in hypertensive participants. < 130/838mHG in hypertensive participants with diabetes, heart failure or chronic kidney disease.;Long Term: Maintenance of blood pressure at goal levels.    Lipids  Yes    Intervention  Provide education and support for participant on nutrition & aerobic/resistive exercise along with prescribed medications to achieve LDL '70mg'$ , HDL >'40mg'$ .    Expected Outcomes  Short Term: Participant states understanding of desired cholesterol values and is compliant with medications prescribed. Participant is following exercise prescription and nutrition guidelines.;Long Term: Cholesterol controlled with  medications as prescribed, with individualized exercise RX and with personalized nutrition plan. Value goals: LDL < '70mg'$ , HDL > 40 mg.       Core Components/Risk Factors/Patient Goals Review:  Goals and Risk Factor Review    Row Name 07/26/18 1736 08/09/18 1742           Core Components/Risk Factors/Patient Goals Review   Personal Goals Review  Improve shortness of breath with ADL's;Lipids;Hypertension  Hypertension;Lipids;Other      Review  Deanna has started back being able to mow the yard for about 30 min at a time.  He does ok with other household chores.  He is taking all meds as directed.  His daughter in law keps his medicine organized for him.  He measures BP at home and reports it usually runs 140/60 or so.    Khris is taking meds as directed.  His daughter in law arranges all his meds to help him take them correctly.  He can tell his legs are stronger since hes been exercising.  He has been having sleep issues.  He feels his sleep got disrupted in the hospital.  He is avoiding caffeine except in the morning.       Expected Outcomes  Short - Jakie will continue to attend class three days per week Long - Timmy will maintan exercise and healthy eating habits  Short - continue to attend HT and use good sleep hygiene Long- achieve better overall sleep pattern         Core Components/Risk Factors/Patient Goals at Discharge (Final Review):  Goals and Risk Factor Review - 08/09/18 1742      Core Components/Risk Factors/Patient Goals Review   Personal Goals Review  Hypertension;Lipids;Other    Review  Lamarcus is taking meds as directed.  His daughter in law arranges all his meds to help him take them correctly.  He can tell his legs are stronger since hes been exercising.  He has been having sleep issues.  He feels his sleep got disrupted in the hospital.  He is avoiding caffeine except in the morning.     Expected Outcomes  Short - continue to attend HT and use good sleep hygiene Long- achieve  better overall sleep pattern       ITP Comments: ITP Comments    Row Name 07/06/18 1443 07/19/18 0914 08/16/18 0552       ITP Comments  Med Review completed. Initial ITP created. Diagnosis can be found in [facility] 3/20  30 day review completed. ITP sent to Dr. Ramonita Lab, covering for Dr. Emily Filbert, Medical Director of Cardiac Rehab. Continue with ITP unless changes are made by physician  New to program  30 day review completed. ITP sent to Dr. Emily Filbert, Medical Director of Cardiac Rehab. Continue with ITP unless changes are made by physician        Comments:

## 2018-08-21 DIAGNOSIS — Z951 Presence of aortocoronary bypass graft: Secondary | ICD-10-CM

## 2018-08-21 DIAGNOSIS — Z952 Presence of prosthetic heart valve: Secondary | ICD-10-CM | POA: Diagnosis not present

## 2018-08-21 NOTE — Progress Notes (Signed)
Daily Session Note  Patient Details  Name: Rodney Wiley MRN: [id number] Date of Birth: 1936-07-16 Referring Provider:     Cardiac Rehab from 07/06/2018 in [facility] Cardiac and Pulmonary Rehab  Referring Provider  [name]      Encounter Date: 08/21/2018  Check In: Session Check In - 08/21/18 1746      Check-In   Supervising physician immediately available to respond to emergencies  See telemetry face sheet for immediately available ER MD    Location  [facility]-Cardiac & Pulmonary Rehab    Staff Present  Nada Maclachlan, BA, ACSM CEP, Exercise Physiologist;Carroll Enterkin, RN, Moises Blood, BS, ACSM CEP, Exercise Physiologist    Medication changes reported      No    Fall or balance concerns reported     No    Warm-up and Cool-down  Performed on first and last piece of equipment    Resistance Training Performed  Yes    VAD Patient?  No    PAD/SET Patient?  No      Pain Assessment   Currently in Pain?  No/denies    Multiple Pain Sites  No          Social History   Tobacco Use  Smoking Status Never Smoker  Smokeless Tobacco Never Used    Goals Met:  Independence with exercise equipment Exercise tolerated well No report of cardiac concerns or symptoms Strength training completed today  Goals Unmet:  Not Applicable  Comments: Pt able to follow exercise prescription today without complaint.  Will continue to monitor for progression.    Dr. Emily Filbert is Medical Director for [place] and [facility].

## 2018-08-21 NOTE — Progress Notes (Signed)
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 Rodney Wiley said he never feels his heart having any type of irregular beat. This evening I faxed all his Cardiac REhab telemetry sheets to Dr. Darnelle Bos office.

## 2018-08-22 DIAGNOSIS — R011 Cardiac murmur, unspecified: Secondary | ICD-10-CM | POA: Diagnosis not present

## 2018-08-22 DIAGNOSIS — R7309 Other abnormal glucose: Secondary | ICD-10-CM | POA: Diagnosis not present

## 2018-08-22 DIAGNOSIS — D696 Thrombocytopenia, unspecified: Secondary | ICD-10-CM | POA: Diagnosis not present

## 2018-08-22 DIAGNOSIS — N400 Enlarged prostate without lower urinary tract symptoms: Secondary | ICD-10-CM | POA: Diagnosis not present

## 2018-08-22 DIAGNOSIS — Z23 Encounter for immunization: Secondary | ICD-10-CM | POA: Diagnosis not present

## 2018-08-22 DIAGNOSIS — R972 Elevated prostate specific antigen [PSA]: Secondary | ICD-10-CM | POA: Diagnosis not present

## 2018-08-22 DIAGNOSIS — R7301 Impaired fasting glucose: Secondary | ICD-10-CM | POA: Diagnosis not present

## 2018-08-22 DIAGNOSIS — R5383 Other fatigue: Secondary | ICD-10-CM | POA: Diagnosis not present

## 2018-08-22 DIAGNOSIS — I499 Cardiac arrhythmia, unspecified: Secondary | ICD-10-CM | POA: Diagnosis not present

## 2018-08-22 DIAGNOSIS — E7800 Pure hypercholesterolemia, unspecified: Secondary | ICD-10-CM | POA: Diagnosis not present

## 2018-08-22 DIAGNOSIS — E559 Vitamin D deficiency, unspecified: Secondary | ICD-10-CM | POA: Diagnosis not present

## 2018-08-22 DIAGNOSIS — E538 Deficiency of other specified B group vitamins: Secondary | ICD-10-CM | POA: Diagnosis not present

## 2018-08-22 DIAGNOSIS — R7989 Other specified abnormal findings of blood chemistry: Secondary | ICD-10-CM | POA: Diagnosis not present

## 2018-08-22 DIAGNOSIS — I10 Essential (primary) hypertension: Secondary | ICD-10-CM | POA: Diagnosis not present

## 2018-08-22 DIAGNOSIS — D649 Anemia, unspecified: Secondary | ICD-10-CM | POA: Diagnosis not present

## 2018-08-22 DIAGNOSIS — R69 Illness, unspecified: Secondary | ICD-10-CM | POA: Diagnosis not present

## 2018-08-23 ENCOUNTER — Encounter: Payer: Medicare HMO | Admitting: *Deleted

## 2018-08-23 DIAGNOSIS — Z951 Presence of aortocoronary bypass graft: Secondary | ICD-10-CM

## 2018-08-23 DIAGNOSIS — M79661 Pain in right lower leg: Secondary | ICD-10-CM | POA: Diagnosis not present

## 2018-08-23 DIAGNOSIS — G603 Idiopathic progressive neuropathy: Secondary | ICD-10-CM | POA: Diagnosis not present

## 2018-08-23 DIAGNOSIS — G608 Other hereditary and idiopathic neuropathies: Secondary | ICD-10-CM | POA: Diagnosis not present

## 2018-08-23 DIAGNOSIS — Z952 Presence of prosthetic heart valve: Secondary | ICD-10-CM | POA: Diagnosis not present

## 2018-08-23 NOTE — Progress Notes (Signed)
Daily Session Note  Patient Details  Name: Rodney Wiley MRN: [id number] Date of Birth: 1936-02-13 Referring Provider:     Cardiac Rehab from 07/06/2018 in [facility] Cardiac and Pulmonary Rehab  Referring Provider  [name]      Encounter Date: 08/23/2018  Check In: Session Check In - 08/23/18 1701      Check-In   Supervising physician immediately available to respond to emergencies  See telemetry face sheet for immediately available ER MD    Location  [facility]-Cardiac & Pulmonary Rehab    Staff Present  Renita Papa, RN Vickki Hearing, BA, ACSM CEP, Exercise Physiologist;Carroll Enterkin, RN, BSN    Medication changes reported      No    Fall or balance concerns reported     No    Tobacco Cessation  No Change    Warm-up and Cool-down  Performed on first and last piece of equipment    Resistance Training Performed  Yes    VAD Patient?  No    PAD/SET Patient?  No      Pain Assessment   Currently in Pain?  No/denies          Social History   Tobacco Use  Smoking Status Never Smoker  Smokeless Tobacco Never Used    Goals Met:  Independence with exercise equipment Exercise tolerated well No report of cardiac concerns or symptoms Strength training completed today  Goals Unmet:  Not Applicable  Comments: Pt able to follow exercise prescription today without complaint.  Will continue to monitor for progression.    Dr. Emily Filbert is Medical Director for [place] and [facility].

## 2018-08-28 DIAGNOSIS — Z951 Presence of aortocoronary bypass graft: Secondary | ICD-10-CM

## 2018-08-28 DIAGNOSIS — Z952 Presence of prosthetic heart valve: Secondary | ICD-10-CM | POA: Diagnosis not present

## 2018-08-28 NOTE — Progress Notes (Signed)
Daily Session Note  Patient Details  Name: Rodney Wiley MRN: [id number] Date of Birth: [birth year] Referring Provider:     Cardiac Rehab from 07/06/2018 in [facility] Cardiac and Pulmonary Rehab  Referring Provider  [name]      Encounter Date: 08/28/2018  Check In:      Social History   Tobacco Use  Smoking Status Never Smoker  Smokeless Tobacco Never Used    Goals Met:  Independence with exercise equipment Exercise tolerated well No report of cardiac concerns or symptoms Strength training completed today  Goals Unmet:  Not Applicable  Comments: Pt able to follow exercise prescription today without complaint.  Will continue to monitor for progression.    Dr. Emily Filbert is Medical Director for [place] and [facility].

## 2018-08-30 ENCOUNTER — Encounter: Payer: Medicare HMO | Admitting: *Deleted

## 2018-08-30 DIAGNOSIS — Z951 Presence of aortocoronary bypass graft: Secondary | ICD-10-CM

## 2018-08-30 DIAGNOSIS — Z952 Presence of prosthetic heart valve: Secondary | ICD-10-CM | POA: Diagnosis not present

## 2018-08-30 NOTE — Progress Notes (Signed)
Daily Session Note  Patient Details  Name: Rodney Wiley MRN: [id number] Date of Birth: 1936/09/04 Referring Provider:     Cardiac Rehab from 07/06/2018 in [facility] Cardiac and Pulmonary Rehab  Referring Provider  [name]      Encounter Date: 08/30/2018  Check In: Session Check In - 08/30/18 1659      Check-In   Staff Present  Alberteen Sam, MA, RCEP, CCRP, Exercise Physiologist;Masato Pettie, RN, BSN;Meredith Sherryll Burger, RN BSN    Medication changes reported      No    Fall or balance concerns reported     No    Tobacco Cessation  No Change    Warm-up and Cool-down  Performed on first and last piece of equipment    Resistance Training Performed  Yes    VAD Patient?  No    PAD/SET Patient?  No      Pain Assessment   Currently in Pain?  No/denies    Multiple Pain Sites  No          Social History   Tobacco Use  Smoking Status Never Smoker  Smokeless Tobacco Never Used    Goals Met:  Proper associated with RPD/PD & O2 Sat Independence with exercise equipment No report of cardiac concerns or symptoms Strength training completed today  Goals Unmet:  Not Applicable  Comments:     Dr. Emily Filbert is Medical Director for [place] and [facility].

## 2018-09-04 DIAGNOSIS — Z952 Presence of prosthetic heart valve: Secondary | ICD-10-CM | POA: Diagnosis not present

## 2018-09-04 DIAGNOSIS — Z951 Presence of aortocoronary bypass graft: Secondary | ICD-10-CM

## 2018-09-04 NOTE — Progress Notes (Signed)
Daily Session Note  Patient Details  Name: ERVEY FALLIN MRN: [id number] Date of Birth: 19-Aug-1936 Referring Provider:     Cardiac Rehab from 07/06/2018 in [facility] Cardiac and Pulmonary Rehab  Referring Provider  [name]      Encounter Date: 09/04/2018  Check In: Session Check In - 09/04/18 1704      Check-In   Supervising physician immediately available to respond to emergencies  See telemetry face sheet for immediately available ER MD    Location  [facility]-Cardiac & Pulmonary Rehab    Staff Present  Justin Mend Jaci Carrel, BS, ACSM CEP, Exercise Physiologist;Susanne Bice, RN, BSN, CCRP    Medication changes reported      No    Fall or balance concerns reported     No    Warm-up and Cool-down  Performed on first and last piece of equipment    Resistance Training Performed  Yes    VAD Patient?  No    PAD/SET Patient?  No      Pain Assessment   Currently in Pain?  No/denies          Social History   Tobacco Use  Smoking Status Never Smoker  Smokeless Tobacco Never Used    Goals Met:  Independence with exercise equipment Exercise tolerated well No report of cardiac concerns or symptoms Strength training completed today  Goals Unmet:  Not Applicable  Comments: Pt able to follow exercise prescription today without complaint.  Will continue to monitor for progression.    Dr. Emily Filbert is Medical Director for [place] and [facility].

## 2018-09-06 ENCOUNTER — Encounter: Payer: Medicare HMO | Attending: Internal Medicine

## 2018-09-06 DIAGNOSIS — Z952 Presence of prosthetic heart valve: Secondary | ICD-10-CM | POA: Diagnosis not present

## 2018-09-06 DIAGNOSIS — Z951 Presence of aortocoronary bypass graft: Secondary | ICD-10-CM | POA: Diagnosis not present

## 2018-09-06 NOTE — Progress Notes (Signed)
Daily Session Note  Patient Details  Name: Rodney Wiley MRN: [id number] Date of Birth: 10-05-1936 Referring Provider:     Cardiac Rehab from 07/06/2018 in [facility] Cardiac and Pulmonary Rehab  Referring Provider  [name]      Encounter Date: 09/06/2018  Check In: Session Check In - 09/06/18 1639      Check-In   Supervising physician immediately available to respond to emergencies  See telemetry face sheet for immediately available ER MD    Location  [facility]-Cardiac & Pulmonary Rehab    Staff Present  Renita Papa, RN Vickki Hearing, BA, ACSM CEP, Exercise Physiologist;Susanne Bice, RN, BSN, CCRP    Medication changes reported      No    Fall or balance concerns reported     No    Warm-up and Cool-down  Performed on first and last piece of equipment    Resistance Training Performed  Yes    VAD Patient?  No    PAD/SET Patient?  No      Pain Assessment   Currently in Pain?  No/denies    Multiple Pain Sites  No          Social History   Tobacco Use  Smoking Status Never Smoker  Smokeless Tobacco Never Used    Goals Met:  Independence with exercise equipment Exercise tolerated well No report of cardiac concerns or symptoms Strength training completed today  Goals Unmet:  Not Applicable  Comments: Pt able to follow exercise prescription today without complaint.  Will continue to monitor for progression.    Dr. Emily Filbert is Medical Director for [name] and [facility].

## 2018-09-11 ENCOUNTER — Encounter: Payer: Medicare HMO | Admitting: *Deleted

## 2018-09-11 VITALS — Ht 66.5 in | Wt 175.0 lb

## 2018-09-11 DIAGNOSIS — Z951 Presence of aortocoronary bypass graft: Secondary | ICD-10-CM | POA: Diagnosis not present

## 2018-09-11 DIAGNOSIS — Z952 Presence of prosthetic heart valve: Secondary | ICD-10-CM | POA: Diagnosis not present

## 2018-09-11 NOTE — Progress Notes (Signed)
Daily Session Note  Patient Details  Name: Rodney Wiley MRN: [id number] Date of Birth: 07/07/1936 Referring Provider:     Cardiac Rehab from 07/06/2018 in [facility]  Referring Provider  [name]      Encounter Date: 09/11/2018  Check In: Session Check In - 09/11/18 1651      Check-In   Supervising physician immediately available to respond to emergencies  See telemetry face sheet for immediately available ER MD    Location  [facility]    Staff Present  Nada Maclachlan, BA, ACSM CEP, Exercise Physiologist;Meredith Sherryll Burger, RN BSN;Carroll Enterkin, RN, Moises Blood, BS, ACSM CEP, Exercise Physiologist    Medication changes reported      No    Fall or balance concerns reported     No    Tobacco Cessation  No Change    Warm-up and Cool-down  Performed on first and last piece of equipment    Resistance Training Performed  Yes    VAD Patient?  No    PAD/SET Patient?  No      Pain Assessment   Currently in Pain?  No/denies    Multiple Pain Sites  No          Social History   Tobacco Use  Smoking Status Never Smoker  Smokeless Tobacco Never Used    Goals Met:  Independence with exercise equipment Exercise tolerated well No report of cardiac concerns or symptoms Strength training completed today  Goals Unmet:  Not Applicable  Comments: Pt able to follow exercise prescription today without complaint.  Will continue to monitor for progression. [place] Name 07/06/18 1428 09/11/18 1652       6 Minute Walk   Phase  -  Discharge    Distance  1112 feet  1480 feet    Distance % Change  -  33 %    Distance Feet Change  -  368 ft    Walk Time  6 minutes  -    # of Rest Breaks  0  0    MPH  2.1  2.8    METS  1.77  2.32    RPE  11  12    Perceived Dyspnea   0  -    VO2 Peak  6.21  8.13    Symptoms  No  No    Resting HR  58 bpm  61 bpm    Resting BP  114/62  132/64    Resting Oxygen Saturation   95 %  -    Exercise  Oxygen Saturation  during 6 min walk  95 %  97 %    Max Ex. HR  80 bpm  54 bpm    Max Ex. BP  114/58  150/82    2 Minute Post BP  104/58  -         Dr. Emily Filbert is Medical Director for [facility].

## 2018-09-13 ENCOUNTER — Encounter: Payer: Self-pay | Admitting: *Deleted

## 2018-09-13 DIAGNOSIS — Z951 Presence of aortocoronary bypass graft: Secondary | ICD-10-CM

## 2018-09-13 NOTE — Progress Notes (Signed)
Cardiac Individual Treatment Plan  Patient Details  Name: Rodney Wiley MRN: [id number] Date of Birth: Oct 27, 1936 Referring Provider:     Cardiac Rehab from 07/06/2018 in [facility] Cardiac and Pulmonary Rehab  Referring Provider  [name]      Initial Encounter Date:    Cardiac Rehab from 07/06/2018 in [facility] Cardiac and Pulmonary Rehab  Date  07/06/18      Visit Diagnosis: S/P CABG x 3  Patient's Home Medications on Admission:  Current Outpatient Medications:  .  amLODipine (NORVASC) 10 MG tablet, Take 10 mg by mouth daily., Disp: , Rfl:  .  atorvastatin (LIPITOR) 20 MG tablet, Take 1 tablet (20 mg total) by mouth daily., Disp: 90 tablet, Rfl: 3 .  carvedilol (COREG) 6.25 MG tablet, Take 1 tablet (6.25 mg total) by mouth 2 (two) times daily., Disp: 180 tablet, Rfl: 3 .  Cholecalciferol (VITAMIN D3) 2000 units capsule, Take 2,000 Units by mouth daily. , Disp: , Rfl:  .  doxazosin (CARDURA) 2 MG tablet, Take 2 mg by mouth daily., Disp: , Rfl:  .  ferrous sulfate 325 (65 FE) MG EC tablet, Take 325 mg by mouth 2 (two) times daily. , Disp: , Rfl:  .  fluticasone-salmeterol (ADVAIR HFA) 115-21 MCG/ACT inhaler, Inhale 2 puffs into the lungs as needed. Use with spacer/ Aerochamber , Disp: , Rfl:  .  folic acid-pyridoxine-cyancobalamin (FOLTX) 2.5-25-2 MG TABS tablet, Take 1 tablet by mouth daily., Disp: 30 each, Rfl:  .  sennosides-docusate sodium (SENOKOT-S) 8.6-50 MG tablet, Take 1 tablet by mouth 2 (two) times daily. , Disp: , Rfl:  .  torsemide (DEMADEX) 20 MG tablet, Take 1 tablet (20 mg total) by mouth daily as needed., Disp: 180 tablet, Rfl: 3 .  vitamin C (ASCORBIC ACID) 500 MG tablet, Take 500 mg by mouth 2 (two) times daily., Disp: , Rfl:   Past Medical History: Past Medical History:  Diagnosis Date  . Aortic insufficiency    a. 02/2018 s/p bioprosthetic AVR; 04/2018 Echo: EF 50-55%, Gr2 DD, Ao bioprosthesis, mean grad 93mHg, Ao root/Asc Ao nl in size, mild MR, mildly dil LA, nl RV fxn. Nl  PASP.  .[name] KitchenArthropathy   . Ascending aortic aneurysm ([place]    a. 12/2016 MRA: 4.3cm @ sinus of valsalva, 4.9cm above sinotubular jxn; b. 02/2018 s/p biological Bentall and AVR.  .[name] KitchenBPH (benign prostatic hyperplasia)   . 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 02/2018 Cath: LAD 20ost, 40p, 722mLCX 60p, OM2 60, OM3 85, RCA 10p/m w/ L->R and R->R collats;  b. 02/2018 CABG x 3: LIMA to LAD, SVG to OM2, SVG to PDA, EVH via right thigh.  . CKD (chronic kidney disease), stage III ([facility]  . Diverticulitis   . Essential hypertension   . GERD (gastroesophageal reflux disease)   . Hemorrhoids   . History of chicken pox   . History of Helicobacter pylori infection 06/2007  . Hyperlipidemia   . Hypertension   . Left renal artery stenosis (HCC)    a. 01/2017 RA u/s: moderate L RAS.  . [name] Kitchenower extremity edema   . Nonrheumatic mitral (valve) insufficiency    a. 12/2016 Echo: mild to mod MR; b. 09/2017 TEE: mild to mod MR; c. 04/2018 Echo: Mild MR.  . [name] Kitchenonrheumatic pulmonary valve insufficiency   . 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 s/p 21 mm Edwards Inspiris Resilia stented bovine pericardial tissue valve and  24 mm Gelweave Valsalva synthetic root conduit with reimplantation of left main coronary artery.    Tobacco Use: Social History   Tobacco Use  Smoking Status Never Smoker  Smokeless Tobacco Never Used    Labs: Recent Review Flowsheet Data    Labs for ITP Cardiac and Pulmonary Rehab Latest Ref Rng & Units 02/23/2018 02/23/2018 02/23/2018 02/23/2018 02/23/2018   Cholestrol 0 - 200 mg/dL - - - - -   LDLCALC 0 - 99 mg/dL - - - - -   HDL >40 mg/dL - - - - -   Trlycerides <150 mg/dL - - - - -   Hemoglobin A1c 4.8 - 5.6 % - - - - -   PHART 7.350 - 7.450 7.345(L) 7.355 7.400 7.370 -   PCO2ART 32.0 - 48.0 mmHg 35.3 35.4 33.1 32.6 -   HCO3 20.0 - 28.0 mmol/L 19.4(L) 19.7(L) 20.3 18.8(L) -   TCO2 22 - 32 mmol/L 20(L) 21(L) 21(L) 20(L)  21(L)   ACIDBASEDEF 0.0 - 2.0 mmol/L 6.0(H) 5.0(H) 4.0(H) 6.0(H) -   O2SAT % 88.0 96.0 94.0 91.0 -       Exercise Target Goals: Exercise Program Goal: Individual exercise prescription set using results from initial 6 min walk test and THRR while considering  patient's activity barriers and safety.   Exercise Prescription Goal: Initial exercise prescription builds to 30-45 minutes a day of aerobic activity, 2-3 days per week.  Home exercise guidelines will be given to patient during program as part of exercise prescription that the participant will acknowledge.  Activity Barriers & Risk Stratification: Activity Barriers & Cardiac Risk Stratification - 07/06/18 1501      Activity Barriers & Cardiac Risk Stratification   Activity Barriers  Arthritis    Cardiac Risk Stratification  High       6 Minute Walk: 6 Minute Walk    Row Name 07/06/18 1428 09/11/18 1652       6 Minute Walk   Phase  -  Discharge    Distance  1112 feet  1480 feet    Distance % Change  -  33 %    Distance Feet Change  -  368 ft    Walk Time  6 minutes  -    # of Rest Breaks  0  0    MPH  2.1  2.8    METS  1.77  2.32    RPE  11  12    Perceived Dyspnea   0  -    VO2 Peak  6.21  8.13    Symptoms  No  No    Resting HR  58 bpm  61 bpm    Resting BP  114/62  132/64    Resting Oxygen Saturation   95 %  -    Exercise Oxygen Saturation  during 6 min walk  95 %  97 %    Max Ex. HR  80 bpm  54 bpm    Max Ex. BP  114/58  150/82    2 Minute Post BP  104/58  -       Oxygen Initial Assessment:   Oxygen Re-Evaluation:   Oxygen Discharge (Final Oxygen Re-Evaluation):   Initial Exercise Prescription: Initial Exercise Prescription - 07/06/18 1400      Date of Initial Exercise RX and Referring Provider   Date  07/06/18    Referring Provider  [name]      Treadmill   MPH  1.5    Grade  0    Minutes  15    METs  2.15      Recumbant Bike   Level  2    RPM  60    Watts  5    Minutes  15    METs   1.8      NuStep   Level  2    SPM  80    Minutes  15    METs  2      Prescription Details   Frequency (times per week)  3    Duration  Progress to 45 minutes of aerobic exercise without signs/symptoms of physical distress      Intensity   THRR 40-80% of Max Heartrate  91-124    Ratings of Perceived Exertion  11-13    Perceived Dyspnea  0-4      Resistance Training   Training Prescription  Yes    Weight  2 lb    Reps  10-15       Perform Capillary Blood Glucose checks as needed.  Exercise Prescription Changes: Exercise Prescription Changes    Row Name 07/06/18 1400 07/11/18 1500 07/26/18 1200 07/26/18 1700 08/10/18 0900     Response to Exercise   Blood Pressure (Admit)  114/62  122/60  132/84  -  140/78   Blood Pressure (Exercise)  114/58  130/58  158/64  -  146/70   Blood Pressure (Exit)  104/58  132/74  134/72  -  130/66   Heart Rate (Admit)  57 bpm  56 bpm  76 bpm  -  61 bpm   Heart Rate (Exercise)  77 bpm  88 bpm  98 bpm  -  108 bpm   Heart Rate (Exit)  80 bpm  78 bpm  79 bpm  -  71 bpm   Oxygen Saturation (Admit)  98 %  -  -  -  -   Oxygen Saturation (Exercise)  96 %  -  -  -  -   Oxygen Saturation (Exit)  97 %  -  -  -  -   Rating of Perceived Exertion (Exercise)  _0 -  14   Symptoms  -  -  -  -  none   Comments  -  first session  -  -  -   Duration  -  Continue with 45 min of aerobic exercise without signs/symptoms of physical distress.  Continue with 45 min of aerobic exercise without signs/symptoms of physical distress.  -  Continue with 45 min of aerobic exercise without signs/symptoms of physical distress.   Intensity  -  THRR unchanged  THRR unchanged  -  THRR unchanged     Progression   Progression  -  Continue to progress workloads to maintain intensity without signs/symptoms of physical distress.  Continue to progress workloads to maintain intensity without signs/symptoms of physical distress.  -  Continue to progress workloads to maintain  intensity without signs/symptoms of physical distress.   Average METs  -  2.98  2.5  -  2.7     Resistance Training   Training Prescription  -  Yes  Yes  -  Yes   Weight  -  2 lb  3 lb  -  3 lb   Reps  -  10-15  10-15  -  10-15     Interval Training   Interval Training  -  -  No  -  No     Treadmill   MPH  -  -  1.5  -  1.5   Grade  -  -  0  -  0   Minutes  -  -  15  -  15   METs  -  -  2.15  -  2.15     Recumbant Bike   Level  -  5  3  -  3   RPM  -  60  60  -  60   Watts  -  25  29  -  30   Minutes  -  15  15  -  15   METs  -  2.98  2.98  -  3.18     Home Exercise Plan   Plans to continue exercise at  -  -  -  Home (comment) walk and handweights  Home (comment) walk and handweights   Frequency  -  -  -  Add 2 additional days to program exercise sessions.  Add 2 additional days to program exercise sessions.   Initial Home Exercises Provided  -  -  -  07/26/18  07/26/18   Row Name 08/23/18 1300 09/07/18 0800           Response to Exercise   Blood Pressure (Admit)  130/64  112/60      Blood Pressure (Exercise)  142/64  170/74      Blood Pressure (Exit)  138/80  122/62      Heart Rate (Admit)  64 bpm  83 bpm      Heart Rate (Exercise)  110 bpm  101 bpm      Heart Rate (Exit)  80 bpm  81 bpm      Rating of Perceived Exertion (Exercise)  13  -      Perceived Dyspnea (Exercise)  -  15      Symptoms  none  none      Duration  Continue with 45 min of aerobic exercise without signs/symptoms of physical distress.  Continue with 45 min of aerobic exercise without signs/symptoms of physical distress.      Intensity  THRR unchanged  THRR unchanged        Progression   Progression  Continue to progress workloads to maintain intensity without signs/symptoms of physical distress.  Continue to progress workloads to maintain intensity without signs/symptoms of physical distress.      Average METs  2.7  2.9        Resistance Training   Training Prescription  Yes  Yes      Weight  3  lb  3 lb      Reps  10-15  10-15        Interval Training   Interval Training  No  No        Treadmill   MPH  1.5  1.5      Grade  0  0.5      Minutes  15  15      METs  2.15  2.25        Recumbant Bike   Level  3  3      RPM  60  60      Watts  29  40      Minutes  15  15      METs  3.18  3.57        Home Exercise Plan   Plans to  continue exercise at  Home (comment) walk and handweights  Home (comment) walk and handweights      Frequency  Add 2 additional days to program exercise sessions.  Add 2 additional days to program exercise sessions.      Initial Home Exercises Provided  07/26/18  07/26/18         Exercise Comments: Exercise Comments    Row Name 07/10/18 1700           Exercise Comments   First full day of exercise!  Patient was oriented to gym and equipment including functions, settings, policies, and procedures.  Patient's individual exercise prescription and treatment plan were reviewed.  All starting workloads were established based on the results of the 6 minute walk test done at initial orientation visit.  The plan for exercise progression was also introduced and progression will be customized based on patient's performance and goals.          Exercise Goals and Review: Exercise Goals    Row Name 07/06/18 1427             Exercise Goals   Increase Physical Activity  Yes       Intervention  Provide advice, education, support and counseling about physical activity/exercise needs.;Develop an individualized exercise prescription for aerobic and resistive training based on initial evaluation findings, risk stratification, comorbidities and participant's personal goals.       Expected Outcomes  Short Term: Attend rehab on a regular basis to increase amount of physical activity.;Long Term: Add in home exercise to make exercise part of routine and to increase amount of physical activity.;Long Term: Exercising regularly at least 3-5 days a week.       Increase  Strength and Stamina  Yes       Intervention  Provide advice, education, support and counseling about physical activity/exercise needs.;Develop an individualized exercise prescription for aerobic and resistive training based on initial evaluation findings, risk stratification, comorbidities and participant's personal goals.       Expected Outcomes  Short Term: Increase workloads from initial exercise prescription for resistance, speed, and METs.;Short Term: Perform resistance training exercises routinely during rehab and add in resistance training at home;Long Term: Improve cardiorespiratory fitness, muscular endurance and strength as measured by increased METs and functional capacity (6MWT)       Able to understand and use rate of perceived exertion (RPE) scale  Yes       Intervention  Provide education and explanation on how to use RPE scale       Expected Outcomes  Short Term: Able to use RPE daily in rehab to express subjective intensity level;Long Term:  Able to use RPE to guide intensity level when exercising independently       Able to understand and use Dyspnea scale  Yes       Intervention  Provide education and explanation on how to use Dyspnea scale       Expected Outcomes  Short Term: Able to use Dyspnea scale daily in rehab to express subjective sense of shortness of breath during exertion;Long Term: Able to use Dyspnea scale to guide intensity level when exercising independently       Knowledge and understanding of Target Heart Rate Range (THRR)  Yes       Intervention  Provide education and explanation of THRR including how the numbers were predicted and where they are located for reference       Expected Outcomes  Short Term: Able to state/look up  THRR;Short Term: Able to use daily as guideline for intensity in rehab;Long Term: Able to use THRR to govern intensity when exercising independently       Able to check pulse independently  Yes       Intervention  Provide education and  demonstration on how to check pulse in carotid and radial arteries.;Review the importance of being able to check your own pulse for safety during independent exercise       Expected Outcomes  Short Term: Able to explain why pulse checking is important during independent exercise;Long Term: Able to check pulse independently and accurately       Understanding of Exercise Prescription  Yes       Intervention  Provide education, explanation, and written materials on patient's individual exercise prescription       Expected Outcomes  Short Term: Able to explain program exercise prescription;Long Term: Able to explain home exercise prescription to exercise independently          Exercise Goals Re-Evaluation : Exercise Goals Re-Evaluation    Row Name 07/10/18 1701 07/26/18 1241 07/26/18 1742 08/10/18 0922 08/23/18 1314     Exercise Goal Re-Evaluation   Exercise Goals Review  Increase Physical Activity;Increase Strength and Stamina;Able to understand and use rate of perceived exertion (RPE) scale;Knowledge and understanding of Target Heart Rate Range (THRR)  Increase Physical Activity;Increase Strength and Stamina;Able to understand and use rate of perceived exertion (RPE) scale  Increase Physical Activity;Increase Strength and Stamina;Able to understand and use rate of perceived exertion (RPE) scale;Knowledge and understanding of Target Heart Rate Range (THRR);Able to check pulse independently  Increase Physical Activity;Increase Strength and Stamina;Able to understand and use rate of perceived exertion (RPE) scale;Knowledge and understanding of Target Heart Rate Range (THRR)  Increase Physical Activity;Increase Strength and Stamina;Able to understand and use rate of perceived exertion (RPE) scale;Knowledge and understanding of Target Heart Rate Range (THRR)   Comments   First full day of exercise!  Patient was oriented to gym and equipment including functions, settings, policies, and procedures.  Patient's  individual exercise prescription and treatment plan were reviewed.  All starting workloads were established based on the results of the 6 minute walk test done at initial orientation visit.  The plan for exercise progression was also introduced and progression will be customized based on patient's performance and goals.  Rodney Wiley is progressing well with exercise and reaches RPE goals.  HR does not quite reach THR.  Staff will monitor progress.  Rodney Wiley plans to walk at home as he has done previously.  He will gradually increase from 15-18 minutes up to 30 minutes.  He will monitor your HR and RPE while exercising at home.  Rodney Wiley has improved overall MET level.  He states he feels his legs are stronger since starting exercise.  His HR and RPE are meeting target goals.   Rodney Wiley attends consistently and tolerates exercise well. He has increased levels on NS and RB and needs to add incline to TM.   Expected Outcomes  Short - use RPE to regulate intensity during sessions Long - follow program prescription  Short - continue to attend consistently Long - increase overall MET level  Short - Rodney Wiley will gradually increase walking time Long - Rodney Wiley will increase overall MET level  Short - add incline to TM Long - increase MET level  Short - add incline to TM Long - increase MET level overall   Row Name 09/07/18 [phone]  Exercise Goal Re-Evaluation   Exercise Goals Review  Increase Physical Activity;Increase Strength and Stamina;Able to understand and use rate of perceived exertion (RPE) scale;Able to understand and use Dyspnea scale       Comments  Bricen has increased overall MET level.  He will complete his  discharge 6 min walk next week.       Expected Outcomes  Short - complete HT program Long - maintain exercise on his own          Discharge Exercise Prescription (Final Exercise Prescription Changes): Exercise Prescription Changes - 09/07/18 0800      Response to Exercise   Blood Pressure (Admit)   112/60    Blood Pressure (Exercise)  170/74    Blood Pressure (Exit)  122/62    Heart Rate (Admit)  83 bpm    Heart Rate (Exercise)  101 bpm    Heart Rate (Exit)  81 bpm    Perceived Dyspnea (Exercise)  15    Symptoms  none    Duration  Continue with 45 min of aerobic exercise without signs/symptoms of physical distress.    Intensity  THRR unchanged      Progression   Progression  Continue to progress workloads to maintain intensity without signs/symptoms of physical distress.    Average METs  2.9      Resistance Training   Training Prescription  Yes    Weight  3 lb    Reps  10-15      Interval Training   Interval Training  No      Treadmill   MPH  1.5    Grade  0.5    Minutes  15    METs  2.25      Recumbant Bike   Level  3    RPM  60    Watts  40    Minutes  15    METs  3.57      Home Exercise Plan   Plans to continue exercise at  Home (comment)   walk and handweights   Frequency  Add 2 additional days to program exercise sessions.    Initial Home Exercises Provided  07/26/18       Nutrition:  Target Goals: Understanding of nutrition guidelines, daily intake of sodium '1500mg'$ , cholesterol '200mg'$ , calories 30% from fat and 7% or less from saturated fats, daily to have 5 or more servings of fruits and vegetables.  Biometrics: Pre Biometrics - 07/06/18 1426      Pre Biometrics   Height  5' 6.5" (1.689 m)    Weight  172 lb 12.8 oz (78.4 kg)    Waist Circumference  36.5 inches    Hip Circumference  42 inches    Waist to Hip Ratio  0.87 %    BMI (Calculated)  27.48    Single Leg Stand  4 seconds      Post Biometrics - 09/11/18 1654       Post  Biometrics   Height  5' 6.5" (1.689 m)    Weight  175 lb (79.4 kg)    Waist Circumference  38 inches    Hip Circumference  43 inches    Waist to Hip Ratio  0.88 %    BMI (Calculated)  27.83    Single Leg Stand  7.64 seconds       Nutrition Therapy Plan and Nutrition Goals: Nutrition Therapy & Goals -  07/17/18 1632      Nutrition Therapy   Diet  TLC    Drug/Food Interactions  Statins/Certain Fruits    Protein (specify units)  7-8oz   CKD stg III   Fiber  30 grams    Whole Grain Foods  3 servings    Saturated Fats  14 max. grams    Fruits and Vegetables  5 servings/day   8 ideal, does not eat fruit daily   Sodium  1500 grams      Personal Nutrition Goals   Nutrition Goal  Include servings of fruit more often, ideally every day. You can start by consistently adding a banana to your morning Cheerios    Personal Goal #2  Monitor/ limit larger portions of protein foods, as these foods make your kidneys work harder   dx of ckd   Personal Goal #3  Limit sodium intake, and be consistent about the amount of fluid you drink to prevent fluid retention and control blood pressure    Comments  His current diet consists of a variety of protein sources and vegetables but does not eat fruit consistently. He no longer cooks for himself, often bringing home deli plates consisting of a meat + 2 vegetables which he splits between lunch and supper. He does not add salt to food      Intervention Plan   Intervention  Prescribe, educate and counsel regarding individualized specific dietary modifications aiming towards targeted core components such as weight, hypertension, lipid management, diabetes, heart failure and other comorbidities.;Nutrition handout(s) given to patient.   Sodium food swaps handout   Expected Outcomes  Short Term Goal: Understand basic principles of dietary content, such as calories, fat, sodium, cholesterol and nutrients.;Short Term Goal: A plan has been developed with personal nutrition goals set during dietitian appointment.;Long Term Goal: Adherence to prescribed nutrition plan.       Nutrition Assessments: Nutrition Assessments - 07/06/18 1452      MEDFICTS Scores   Pre Score  18       Nutrition Goals Re-Evaluation: Nutrition Goals Re-Evaluation    Row Name 07/17/18 1641  08/09/18 1719 09/06/18 1753         Goals   Nutrition Goal  Include servings of fruit more often, ideally every day. You can start by consistently adding a banana to your morning Cheerios  Include servings of fruit more often, ideally daily; Monitor and moderate intake of protein and sodium d/t CKD and heart disease / HTN  Include servings of fruit more often - ideally daily; monitor larger portions of meats; limit sodium intake and be consistent about of the amount of fluids you drink for optimal fluid/electrolyte balance     Comment  He currently eats fruit ~3 days/ week. Typically chooses an apple or a banana  He has been increasing his fruit intake, including options like apples and bananas more regularly. He has also been more conscious of sodium and protein  He has been more serious about dietary changes "the last month or two." He has started to eat bananas and apples at least 3 days/week. He is more mindful about his sodium intake though knows there is sodium in the meals he eats away from home. He has been eating beans daily for lunch (non-canned) and includes vegetables daily. He eats mainly [place] chicken and salmon for meats     Expected Outcome  He will consume at least 1 serving of fruit daily  He will continue to increast his fruit intake until he is eating multiple servings per day; he will continue to monitor  and moderate protein and sodium intake  He will continue to increase fruit intake until he is eating at least 1 serving per day. He will continue to increase awareness about sodium intake and limit as much as possible. He will keep portions of protiens moderate       Personal Goal #2 Re-Evaluation   Personal Goal #2  Monitor/ limit larger portions of protein foods, as these foods make your kidneys work harder  -  -       Personal Goal #3 Re-Evaluation   Personal Goal #3  Limit sodium intake, and be consistent about the amount of fluid you drink to prevent fluid retention and  control blood pressure  -  -        Nutrition Goals Discharge (Final Nutrition Goals Re-Evaluation): Nutrition Goals Re-Evaluation - 09/06/18 1753      Goals   Nutrition Goal  Include servings of fruit more often - ideally daily; monitor larger portions of meats; limit sodium intake and be consistent about of the amount of fluids you drink for optimal fluid/electrolyte balance    Comment  He has been more serious about dietary changes "the last month or two." He has started to eat bananas and apples at least 3 days/week. He is more mindful about his sodium intake though knows there is sodium in the meals he eats away from home. He has been eating beans daily for lunch (non-canned) and includes vegetables daily. He eats mainly [place] chicken and salmon for meats    Expected Outcome  He will continue to increase fruit intake until he is eating at least 1 serving per day. He will continue to increase awareness about sodium intake and limit as much as possible. He will keep portions of protiens moderate       Psychosocial: Target Goals: Acknowledge presence or absence of significant depression and/or stress, maximize coping skills, provide positive support system. Participant is able to verbalize types and ability to use techniques and skills needed for reducing stress and depression.   Initial Review & Psychosocial Screening: Initial Psych Review & Screening - 07/06/18 1453      Initial Review   Current issues with  Current Sleep Concerns;Current Stress Concerns    Comments  Rodney Wiley is getting back to being independent living at home. 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 Now he just wants to get back to doing whatever he pleases.       Family Dynamics   Good Support System?  Yes   children, family     Barriers   Psychosocial barriers to participate in program  There are no identifiable barriers  or psychosocial needs.;The patient should benefit from training in stress management and relaxation.      Screening Interventions   Interventions  Encouraged to exercise;Provide feedback about the scores to participant;Program counselor consult;To provide support and resources with identified psychosocial needs    Expected Outcomes  Short Term goal: Utilizing psychosocial counselor, staff and physician to assist with identification of specific Stressors or current issues interfering with healing process. Setting desired goal for each stressor or current issue identified.;Long Term Goal: Stressors or current issues are controlled or eliminated.;Short Term goal: Identification and review with participant of any Quality of Life or Depression concerns found by scoring the questionnaire.;Long Term goal: The participant improves quality of Life and PHQ9 Scores as seen by post scores and/or verbalization  of changes       Quality of Life Scores:  Quality of Life - 07/06/18 1454      Quality of Life   Select  Quality of Life      Quality of Life Scores   Health/Function Pre  28.08 %    Socioeconomic Pre  30 %    Psych/Spiritual Pre  30 %    Family Pre  30 %    GLOBAL Pre  28.92 %      Scores of 19 and below usually indicate a poorer quality of life in these areas.  A difference of  2-3 points is a clinically meaningful difference.  A difference of 2-3 points in the total score of the Quality of Life Index has been associated with significant improvement in overall quality of life, self-image, physical symptoms, and general health in studies assessing change in quality of life.  PHQ-9: Recent Review Flowsheet Data    Depression screen [facility] 2/9 07/06/2018   Decreased Interest 0   Down, Depressed, Hopeless 0   PHQ - 2 Score 0   Altered sleeping 1   Tired, decreased energy 0   Change in appetite 0   Feeling bad or failure about yourself  0   Trouble concentrating 0   Moving slowly or  fidgety/restless 0   Suicidal thoughts 0   PHQ-9 Score 1   Difficult doing work/chores Not difficult at all     Interpretation of Total Score  Total Score Depression Severity:  1-4 = Minimal depression, 5-9 = Mild depression, 10-14 = Moderate depression, 15-19 = Moderately severe depression, 20-27 = Severe depression   Psychosocial Evaluation and Intervention: Psychosocial Evaluation - 07/10/18 1706      Psychosocial Evaluation & Interventions   Interventions  Encouraged to exercise with the program and follow exercise prescription    Comments  Counselor met with Rodney [facility]) today for initial psychosocial evaluation.  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  Kristi has a strong support system with a son and daughter-in-law locally; friends and a church community.  He reports sleeping better this past week and an improved appetite.  He denies a history of depression or anxiety or any current symptoms and states he is typically in a positive mood.  Rodney Wiley has goals to return to normal activities and get his strength and stamina back to do so.  Staff will follow with him.     Expected Outcomes  Short:  Rodney Wiley will begin to exercise regularly to regain his stamina and strength.   Long:  Rodney Wiley will develop a habit of exercise to maintain his strength and energy to be able to participate in normal activities.      Continue Psychosocial Services   Follow up required by staff       Psychosocial Re-Evaluation:   Psychosocial Discharge (Final Psychosocial Re-Evaluation):   Vocational Rehabilitation: Provide vocational rehab assistance to qualifying candidates.   Vocational Rehab Evaluation & Intervention: Vocational Rehab - 07/06/18 1500      Initial Vocational Rehab Evaluation & Intervention   Assessment shows need for Vocational Rehabilitation  No       Education: Education Goals: Education  classes will be provided on a variety of topics geared toward better understanding of heart health and risk factor modification. Participant will state understanding/return demonstration of topics presented as noted by education test scores.  Learning Barriers/Preferences: Learning Barriers/Preferences - 07/06/18 1457      Learning Barriers/Preferences   Learning Barriers  None    Learning Preferences  Group Instruction       Education Topics:  AED/CPR: - Group verbal and written instruction with the use of models to demonstrate the basic use of the AED with the basic ABC's of resuscitation.   Cardiac Rehab from 09/11/2018 in [facility] Cardiac and Pulmonary Rehab  Date  08/21/18  Educator  CE  Instruction Review Code  1- Verbalizes Understanding      General Nutrition Guidelines/Fats and Fiber: -Group instruction provided by verbal, written material, models and posters to present the general guidelines for heart healthy nutrition. Gives an explanation and review of dietary fats and fiber.   Cardiac Rehab from 09/11/2018 in [facility] Cardiac and Pulmonary Rehab  Date  08/14/18  Educator  LB  Instruction Review Code  1- Verbalizes Understanding      Controlling Sodium/Reading Food Labels: -Group verbal and written material supporting the discussion of sodium use in heart healthy nutrition. Review and explanation with models, verbal and written materials for utilization of the food label.   Cardiac Rehab from 09/11/2018 in [facility] Cardiac and Pulmonary Rehab  Date  08/23/18  Educator  LB  Instruction Review Code  1- Verbalizes Understanding      Exercise Physiology & General Exercise Guidelines: - Group verbal and written instruction with models to review the exercise physiology of the cardiovascular system and associated critical values. Provides general exercise guidelines with specific guidelines to those with heart or lung disease.    Cardiac Rehab from 09/11/2018 in [facility] Cardiac and  Pulmonary Rehab  Date  08/30/18  Educator  Nada Maclachlan, EP  Instruction Review Code  1- Verbalizes Understanding      Aerobic Exercise & Resistance Training: - Gives group verbal and written instruction on the various components of exercise. Focuses on aerobic and resistive training programs and the benefits of this training and how to safely progress through these programs..   Cardiac Rehab from 09/11/2018 in [facility] Cardiac and Pulmonary Rehab  Date  09/04/18  Educator  [facility]  Instruction Review Code  1- Geologist, engineering, Balance, Mind/Body Relaxation: Provides group verbal/written instruction on the benefits of flexibility and balance training, including mind/body exercise modes such as yoga, pilates and tai chi.  Demonstration and skill practice provided.   Cardiac Rehab from 09/11/2018 in [facility] Cardiac and Pulmonary Rehab  Date  09/11/18  Educator  AS  Instruction Review Code  1- Verbalizes Understanding      Stress and Anxiety: - Provides group verbal and written instruction about the health risks of elevated stress and causes of high stress.  Discuss the correlation between heart/lung disease and anxiety and treatment options. Review healthy ways to manage with stress and anxiety.   Cardiac Rehab from 09/11/2018 in [facility] Cardiac and Pulmonary Rehab  Date  07/26/18  Educator  [facility]  Instruction Review Code  1- Verbalizes Understanding      Depression: - Provides group verbal and written instruction on the correlation between heart/lung disease and depressed mood, treatment options, and the stigmas associated with seeking treatment.   Cardiac Rehab from 09/11/2018 in [facility] Cardiac and Pulmonary Rehab  Date  07/12/18  Educator  Lucianne Lei, MSW  Instruction Review Code  2- Demonstrated Understanding      Anatomy & Physiology of the Heart: - Group verbal and written instruction and models provide basic cardiac  anatomy and physiology, with the coronary  electrical and arterial systems. Review of Valvular disease and Heart Failure   Cardiac Procedures: - Group verbal and written instruction to review commonly prescribed medications for heart disease. Reviews the medication, class of the drug, and side effects. Includes the steps to properly store meds and maintain the prescription regimen. (beta blockers and nitrates)   Cardiac Rehab from 09/11/2018 in [facility] Cardiac and Pulmonary Rehab  Date  08/16/18  Educator  [facility]  Instruction Review Code  1- Verbalizes Understanding      Cardiac Medications I: - Group verbal and written instruction to review commonly prescribed medications for heart disease. Reviews the medication, class of the drug, and side effects. Includes the steps to properly store meds and maintain the prescription regimen.   Cardiac Rehab from 09/11/2018 in [facility] Cardiac and Pulmonary Rehab  Date  07/31/18  Educator  CE  Instruction Review Code  1- Verbalizes Understanding      Cardiac Medications II: -Group verbal and written instruction to review commonly prescribed medications for heart disease. Reviews the medication, class of the drug, and side effects. (all other drug classes)   Cardiac Rehab from 09/11/2018 in [facility] Cardiac and Pulmonary Rehab  Date  07/19/18  Educator  [facility]  Instruction Review Code  1- Verbalizes Understanding       Go Sex-Intimacy & Heart Disease, Get SMART - Goal Setting: - Group verbal and written instruction through game format to discuss heart disease and the return to sexual intimacy. Provides group verbal and written material to discuss and apply goal setting through the application of the S.M.A.R.T. Method.   Cardiac Rehab from 09/11/2018 in [facility] Cardiac and Pulmonary Rehab  Date  08/16/18  Educator  [facility]  Instruction Review Code  1- Verbalizes Understanding      Other Matters of the Heart: - Provides group verbal, written materials and models to describe Stable Angina and Peripheral Artery.  Includes description of the disease process and treatment options available to the cardiac patient.   Exercise & Equipment Safety: - Individual verbal instruction and demonstration of equipment use and safety with use of the equipment.   Cardiac Rehab from 09/11/2018 in [facility] Cardiac and Pulmonary Rehab  Date  07/06/18  Educator  [facility]  Instruction Review Code  1- Verbalizes Understanding      Infection Prevention: - Provides verbal and written material to individual with discussion of infection control including proper hand washing and proper equipment cleaning during exercise session.   Cardiac Rehab from 09/11/2018 in [facility] Cardiac and Pulmonary Rehab  Date  07/06/18  Educator  [facility]  Instruction Review Code  1- Verbalizes Understanding      Falls Prevention: - Provides verbal and written material to individual with discussion of falls prevention and safety.   Cardiac Rehab from 09/11/2018 in [facility] Cardiac and Pulmonary Rehab  Date  07/06/18  Educator  [facility]  Instruction Review Code  1- Verbalizes Understanding      Diabetes: - Individual verbal and written instruction to review signs/symptoms of diabetes, desired ranges of glucose level fasting, after meals and with exercise. Acknowledge that pre and post exercise glucose checks will be done for 3 sessions at entry of program.   Cardiac Rehab from 09/11/2018 in [facility] Cardiac and Pulmonary Rehab  Date  09/06/18  Educator  [facility]  Instruction Review Code  1- Verbalizes Understanding      Know Your Numbers and Risk Factors: -Group verbal and written instruction about important numbers in  your health.  Discussion of what are risk factors and how they play a role in the disease process.  Review of Cholesterol, Blood Pressure, Diabetes, and BMI and the role they play in your overall health.   Cardiac Rehab from 09/11/2018 in [facility] Cardiac and Pulmonary Rehab  Date  07/19/18  Educator  [facility]  Instruction Review Code  1- Verbalizes Understanding       Sleep Hygiene: -Provides group verbal and written instruction about how sleep can affect your health.  Define sleep hygiene, discuss sleep cycles and impact of sleep habits. Review good sleep hygiene tips.    Cardiac Rehab from 09/11/2018 in [facility] Cardiac and Pulmonary Rehab  Date  08/09/18  Educator  [facility]  Instruction Review Code  1- Verbalizes Understanding      Other: -Provides group and verbal instruction on various topics (see comments)   Knowledge Questionnaire Score: Knowledge Questionnaire Score - 07/06/18 1457      Knowledge Questionnaire Score   Pre Score  15/26   correct answers reviewed with Rodney Wiley, focus on nutrition, exercise      Core Components/Risk Factors/Patient Goals at Admission: Personal Goals and Risk Factors at Admission - 07/06/18 1446      Core Components/Risk Factors/Patient Goals on Admission    Weight Management  Yes;Weight Maintenance    Intervention  Weight Management: Develop a combined nutrition and exercise program designed to reach desired caloric intake, while maintaining appropriate intake of nutrient and fiber, sodium and fats, and appropriate energy expenditure required for the weight goal.;Weight Management: Provide education and appropriate resources to help participant work on and attain dietary goals.    Admit Weight  170 lb (77.1 kg)    Expected Outcomes  Long Term: Adherence to nutrition and physical activity/exercise program aimed toward attainment of established weight goal;Weight Maintenance: Understanding of the daily nutrition guidelines, which includes 25-35% calories from fat, 7% or less cal from saturated fats, less than '200mg'$  cholesterol, less than 1.5gm of sodium, & 5 or more servings of fruits and vegetables daily;Short Term: Continue to assess and modify interventions until short term weight is achieved;Understanding recommendations for meals to include 15-35% energy as protein, 25-35% energy from fat, 35-60% energy from  carbohydrates, less than '200mg'$  of dietary cholesterol, 20-35 gm of total fiber daily;Understanding of distribution of calorie intake throughout the day with the consumption of 4-5 meals/snacks    Hypertension  Yes    Intervention  Provide education on lifestyle modifcations including regular physical activity/exercise, weight management, moderate sodium restriction and increased consumption of fresh fruit, vegetables, and low fat dairy, alcohol moderation, and smoking cessation.;Monitor prescription use compliance.    Expected Outcomes  Short Term: Continued assessment and intervention until BP is < 140/25m HG in hypertensive participants. < 130/879mHG in hypertensive participants with diabetes, heart failure or chronic kidney disease.;Long Term: Maintenance of blood pressure at goal levels.    Lipids  Yes    Intervention  Provide education and support for participant on nutrition & aerobic/resistive exercise along with prescribed medications to achieve LDL '70mg'$ , HDL >'40mg'$ .    Expected Outcomes  Short Term: Participant states understanding of desired cholesterol values and is compliant with medications prescribed. Participant is following exercise prescription and nutrition guidelines.;Long Term: Cholesterol controlled with medications as prescribed, with individualized exercise RX and with personalized nutrition plan. Value goals: LDL < '70mg'$ , HDL > 40 mg.       Core Components/Risk Factors/Patient Goals Review:  Goals and Risk Factor Review  Rodney Wiley Name 07/26/18 1736 08/09/18 1742 08/21/18 1657 08/21/18 1824       Core Components/Risk Factors/Patient Goals Review   Personal Goals Review  Improve shortness of breath with ADL's;Lipids;Hypertension  Hypertension;Lipids;Other  Weight Management/Obesity;Other;Lipids;Hypertension  -    Review  Rodney Wiley has started back being able to mow the yard for about 30 min at a time.  He does ok with other household chores.  He is taking all meds as directed.  His  daughter in law keps his medicine organized for him.  He measures BP at home and reports it usually runs 140/60 or so.    Rodney Wiley is taking meds as directed.  His daughter in law arranges all his meds to help him take them correctly.  He can tell his legs are stronger since hes been exercising.  He has been having sleep issues.  He feels his sleep got disrupted in the hospital.  He is avoiding caffeine except in the morning.   Rodney Wiley continues to take meds as directed.  He is doing yard work but not structured exercise at home.    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 Rodney Wiley said he never feels his heart having any type of irregular beat. This evening I faxed all his Cardiac REhab telemetry sheets to Dr. Darnelle Bos office.    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 Rodney Wiley said he never feels his heart having any type of irregular beat. This evening I faxed all his Cardiac REhab telemetry sheets to Dr. Darnelle Bos office.    Expected Outcomes  Cont to not have s/s with PVCs       ITP Comments: ITP Comments    Row Name 07/06/18 1443 07/19/18 0914 08/16/18 0552 09/13/18 0739     ITP Comments  Med Review completed. Initial ITP created. Diagnosis can be found in [facility] 3/20  30 day review completed. ITP sent to Dr. Ramonita Lab, covering for Dr. Emily Filbert, Medical Director of Cardiac Rehab. Continue with ITP unless changes are made by physician  New to  program  30 day review completed. ITP sent to Dr. Emily Filbert, Medical Director of Cardiac Rehab. Continue with ITP unless changes are made by physician  30 day review completed. ITP sent to Dr. Emily Filbert, Medical Director of Cardiac Rehab. Continue with ITP unless changes are made by physician       Comments:

## 2018-09-14 DIAGNOSIS — Z952 Presence of prosthetic heart valve: Secondary | ICD-10-CM | POA: Diagnosis not present

## 2018-09-14 DIAGNOSIS — Z951 Presence of aortocoronary bypass graft: Secondary | ICD-10-CM | POA: Diagnosis not present

## 2018-09-14 NOTE — Patient Instructions (Signed)
Discharge Patient Instructions  Patient Details  Name: Rodney Wiley MRN: [id number] Date of Birth: Jan 31, 1936 Referring Provider:  Nelva Bush, MD   Number of Visits: 26  Reason for Discharge:  Patient reached a stable level of exercise. Patient independent in their exercise. Patient has met program and personal goals.  Smoking History:  Social History   Tobacco Use  Smoking Status Never Smoker  Smokeless Tobacco Never Used    Diagnosis:  No diagnosis found.  Initial Exercise Prescription: Initial Exercise Prescription - 07/06/18 1400      Date of Initial Exercise RX and Referring Provider   Date  07/06/18    Referring Provider  [name]      Treadmill   MPH  1.5    Grade  0    Minutes  15    METs  2.15      Recumbant Bike   Level  2    RPM  60    Watts  5    Minutes  15    METs  1.8      NuStep   Level  2    SPM  80    Minutes  15    METs  2      Prescription Details   Frequency (times per week)  3    Duration  Progress to 45 minutes of aerobic exercise without signs/symptoms of physical distress      Intensity   THRR 40-80% of Max Heartrate  91-124    Ratings of Perceived Exertion  11-13    Perceived Dyspnea  0-4      Resistance Training   Training Prescription  Yes    Weight  2 lb    Reps  10-15       Discharge Exercise Prescription (Final Exercise Prescription Changes): Exercise Prescription Changes - 09/07/18 0800      Response to Exercise   Blood Pressure (Admit)  112/60    Blood Pressure (Exercise)  170/74    Blood Pressure (Exit)  122/62    Heart Rate (Admit)  83 bpm    Heart Rate (Exercise)  101 bpm    Heart Rate (Exit)  81 bpm    Perceived Dyspnea (Exercise)  15    Symptoms  none    Duration  Continue with 45 min of aerobic exercise without signs/symptoms of physical distress.    Intensity  THRR unchanged      Progression   Progression  Continue to progress workloads to maintain intensity without signs/symptoms of  physical distress.    Average METs  2.9      Resistance Training   Training Prescription  Yes    Weight  3 lb    Reps  10-15      Interval Training   Interval Training  No      Treadmill   MPH  1.5    Grade  0.5    Minutes  15    METs  2.25      Recumbant Bike   Level  3    RPM  60    Watts  40    Minutes  15    METs  3.57      Home Exercise Plan   Plans to continue exercise at  Home (comment)   walk and handweights   Frequency  Add 2 additional days to program exercise sessions.    Initial Home Exercises Provided  07/26/18       Functional Capacity:  [place] Name 07/06/18 1428 09/11/18 1652       6 Minute Walk   Phase  -  Discharge    Distance  1112 feet  1480 feet    Distance % Change  -  33 %    Distance Feet Change  -  368 ft    Walk Time  6 minutes  -    # of Rest Breaks  0  0    MPH  2.1  2.8    METS  1.77  2.32    RPE  11  12    Perceived Dyspnea   0  -    VO2 Peak  6.21  8.13    Symptoms  No  No    Resting HR  58 bpm  61 bpm    Resting BP  114/62  132/64    Resting Oxygen Saturation   95 %  -    Exercise Oxygen Saturation  during 6 min walk  95 %  97 %    Max Ex. HR  80 bpm  54 bpm    Max Ex. BP  114/58  150/82    2 Minute Post BP  104/58  -       Quality of Life: Quality of Life - 09/14/18 1617      Quality of Life   Select  Quality of Life      Quality of Life Scores   Health/Function Post  24 %    Socioeconomic Post  30 %    Psych/Spiritual Post  28.57 %    Family Post  24.6 %    GLOBAL Post  26.31 %       Personal Goals: Goals established at orientation with interventions provided to work toward goal. Personal Goals and Risk Factors at Admission - 07/06/18 1446      Core Components/Risk Factors/Patient Goals on Admission    Weight Management  Yes;Weight Maintenance    Intervention  Weight Management: Develop a combined nutrition and exercise program designed to reach desired caloric intake, while maintaining  appropriate intake of nutrient and fiber, sodium and fats, and appropriate energy expenditure required for the weight goal.;Weight Management: Provide education and appropriate resources to help participant work on and attain dietary goals.    Admit Weight  170 lb (77.1 kg)    Expected Outcomes  Long Term: Adherence to nutrition and physical activity/exercise program aimed toward attainment of established weight goal;Weight Maintenance: Understanding of the daily nutrition guidelines, which includes 25-35% calories from fat, 7% or less cal from saturated fats, less than 257m cholesterol, less than 1.5gm of sodium, & 5 or more servings of fruits and vegetables daily;Short Term: Continue to assess and modify interventions until short term weight is achieved;Understanding recommendations for meals to include 15-35% energy as protein, 25-35% energy from fat, 35-60% energy from carbohydrates, less than 2069mof dietary cholesterol, 20-35 gm of total fiber daily;Understanding of distribution of calorie intake throughout the day with the consumption of 4-5 meals/snacks    Hypertension  Yes    Intervention  Provide education on lifestyle modifcations including regular physical activity/exercise, weight management, moderate sodium restriction and increased consumption of fresh fruit, vegetables, and low fat dairy, alcohol moderation, and smoking cessation.;Monitor prescription use compliance.    Expected Outcomes  Short Term: Continued assessment and intervention until BP is < 140/9081mG in hypertensive participants. < 130/55m49m in hypertensive participants with diabetes, heart failure or chronic  kidney disease.;Long Term: Maintenance of blood pressure at goal levels.    Lipids  Yes    Intervention  Provide education and support for participant on nutrition & aerobic/resistive exercise along with prescribed medications to achieve LDL <4m, HDL >412m    Expected Outcomes  Short Term: Participant states  understanding of desired cholesterol values and is compliant with medications prescribed. Participant is following exercise prescription and nutrition guidelines.;Long Term: Cholesterol controlled with medications as prescribed, with individualized exercise RX and with personalized nutrition plan. Value goals: LDL < 7018mHDL > 40 mg.        Personal Goals Discharge: Goals and Risk Factor Review - 08/21/18 1824      Core Components/Risk Factors/Patient Goals Review   Review  BobMarshalls had multiple asymptomatic PVCs. BobAbid he never feels his heart having any type of irregular beat. This evening I faxed all his Cardiac REhab telemetry sheets to Dr. EndDarnelle Bosfice.    Expected Outcomes  Cont to not have s/s with PVCs       Exercise Goals and Review: Exercise Goals    Row Name 07/06/18 1427             Exercise Goals   Increase Physical Activity  Yes       Intervention  Provide advice, education, support and counseling about physical activity/exercise needs.;Develop an individualized exercise prescription for aerobic and resistive training based on initial evaluation findings, risk stratification, comorbidities and participant's personal goals.       Expected Outcomes  Short Term: Attend rehab on a regular basis to increase amount of physical activity.;Long Term: Add in home exercise to make exercise part of routine and to increase amount of physical activity.;Long Term: Exercising regularly at least 3-5 days a week.       Increase Strength and Stamina  Yes       Intervention  Provide advice, education, support and counseling about physical activity/exercise needs.;Develop an individualized exercise prescription for aerobic and resistive training based on initial evaluation findings, risk stratification, comorbidities and participant's personal goals.       Expected Outcomes  Short Term: Increase workloads from initial exercise prescription for resistance, speed, and METs.;Short Term:  Perform resistance training exercises routinely during rehab and add in resistance training at home;Long Term: Improve cardiorespiratory fitness, muscular endurance and strength as measured by increased METs and functional capacity (6MWT)       Able to understand and use rate of perceived exertion (RPE) scale  Yes       Intervention  Provide education and explanation on how to use RPE scale       Expected Outcomes  Short Term: Able to use RPE daily in rehab to express subjective intensity level;Long Term:  Able to use RPE to guide intensity level when exercising independently       Able to understand and use Dyspnea scale  Yes       Intervention  Provide education and explanation on how to use Dyspnea scale       Expected Outcomes  Short Term: Able to use Dyspnea scale daily in rehab to express subjective sense of shortness of breath during exertion;Long Term: Able to use Dyspnea scale to guide intensity level when exercising independently       Knowledge and understanding of Target Heart Rate Range (THRR)  Yes       Intervention  Provide education and explanation of THRR including how the numbers were predicted and where they are located  for reference       Expected Outcomes  Short Term: Able to state/look up THRR;Short Term: Able to use daily as guideline for intensity in rehab;Long Term: Able to use THRR to govern intensity when exercising independently       Able to check pulse independently  Yes       Intervention  Provide education and demonstration on how to check pulse in carotid and radial arteries.;Review the importance of being able to check your own pulse for safety during independent exercise       Expected Outcomes  Short Term: Able to explain why pulse checking is important during independent exercise;Long Term: Able to check pulse independently and accurately       Understanding of Exercise Prescription  Yes       Intervention  Provide education, explanation, and written materials on  patient's individual exercise prescription       Expected Outcomes  Short Term: Able to explain program exercise prescription;Long Term: Able to explain home exercise prescription to exercise independently          Nutrition & Weight - Outcomes: Pre Biometrics - 07/06/18 1426      Pre Biometrics   Height  5' 6.5" (1.689 m)    Weight  172 lb 12.8 oz (78.4 kg)    Waist Circumference  36.5 inches    Hip Circumference  42 inches    Waist to Hip Ratio  0.87 %    BMI (Calculated)  27.48    Single Leg Stand  4 seconds      Post Biometrics - 09/11/18 1654       Post  Biometrics   Height  5' 6.5" (1.689 m)    Weight  175 lb (79.4 kg)    Waist Circumference  38 inches    Hip Circumference  43 inches    Waist to Hip Ratio  0.88 %    BMI (Calculated)  27.83    Single Leg Stand  7.64 seconds       Nutrition: Nutrition Therapy & Goals - 07/17/18 1632      Nutrition Therapy   Diet  TLC    Drug/Food Interactions  Statins/Certain Fruits    Protein (specify units)  7-8oz   CKD stg III   Fiber  30 grams    Whole Grain Foods  3 servings    Saturated Fats  14 max. grams    Fruits and Vegetables  5 servings/day   8 ideal, does not eat fruit daily   Sodium  1500 grams      Personal Nutrition Goals   Nutrition Goal  Include servings of fruit more often, ideally every day. You can start by consistently adding a banana to your morning Cheerios    Personal Goal #2  Monitor/ limit larger portions of protein foods, as these foods make your kidneys work harder   dx of ckd   Personal Goal #3  Limit sodium intake, and be consistent about the amount of fluid you drink to prevent fluid retention and control blood pressure    Comments  His current diet consists of a variety of protein sources and vegetables but does not eat fruit consistently. He no longer cooks for himself, often bringing home deli plates consisting of a meat + 2 vegetables which he splits between lunch and supper. He does not add  salt to [place], educate and counsel regarding individualized  specific dietary modifications aiming towards targeted core components such as weight, hypertension, lipid management, diabetes, heart failure and other comorbidities.;Nutrition handout(s) given to patient.   Sodium food swaps handout   Expected Outcomes  Short Term Goal: Understand basic principles of dietary content, such as calories, fat, sodium, cholesterol and nutrients.;Short Term Goal: A plan has been developed with personal nutrition goals set during dietitian appointment.;Long Term Goal: Adherence to prescribed nutrition plan.       Nutrition Discharge: Nutrition Assessments - 09/14/18 1619      MEDFICTS Scores   Post Score  22       Education Questionnaire Score: Knowledge Questionnaire Score - 07/06/18 1457      Knowledge Questionnaire Score   Pre Score  15/26   correct answers reviewed with Demetrie, focus on nutrition, exercise      Goals reviewed with patient; copy given to patient.

## 2018-09-14 NOTE — Progress Notes (Signed)
Daily Session Note  Patient Details  Name: Rodney Wiley MRN: [id number] Date of Birth: 1936-06-22 Referring Provider:     Cardiac Rehab from 07/06/2018 in [facility]  Referring Provider  Rodney Wiley      Encounter Date: 09/14/2018  Check In: Session Check In - 09/14/18 1629      Check-In   Supervising physician immediately available to respond to emergencies  See telemetry face sheet for immediately available ER MD    Location  [facility]iac & Pulmonary Rehab    Staff Present  Rodney Papa, RN BSN;Rodney Wiley RCP,RRT,BSRT;Rodney Rigger RN, BSN;Rodney Bice, RN, BSN, CCRP    Medication changes reported      No    Fall or balance concerns reported     No    Warm-up and Cool-down  Performed on first and last piece of equipment    Resistance Training Performed  Yes    VAD Patient?  No    PAD/SET Patient?  No      Pain Assessment   Currently in Pain?  No/denies          Social History   Tobacco Use  Smoking Status Never Smoker  Smokeless Tobacco Never Used    Goals Met:  Proper associated with RPD/PD & O2 Sat Independence with exercise equipment Exercise tolerated well No report of cardiac concerns or symptoms Strength training completed today  Goals Unmet:  Not Applicable  Comments:  Rodney Wiley graduated today from  rehab with 36 sessions completed.  Details of the patient's exercise prescription and what He needs to do in order to continue the prescription and progress were discussed with patient.  Patient was given a copy of prescription and goals.  Patient verbalized understanding.  Rodney Wiley plans to continue to exercise by using his treadmill and bike at home.   Dr. Emily Filbert is Medical Director for [facility] and [facility].

## 2018-09-14 NOTE — Progress Notes (Signed)
Discharge Progress Report  Patient Details  Name: JASHAWN FLOYD MRN: [id number] Date of Birth: Sep 21, 1936 Referring Provider:     Cardiac Rehab from 07/06/2018 in [facility]  Referring Provider  [name]       Number of Visits: 36/36  Reason for Discharge:  Patient reached a stable level of exercise. Patient independent in their exercise. Patient has met program and personal goals.  Smoking History:  Social History   Tobacco Use  Smoking Status Never Smoker  Smokeless Tobacco Never Used    Diagnosis:  S/P CABG x 3  ADL UCSD:   Initial Exercise Prescription: Initial Exercise Prescription - 07/06/18 1400      Date of Initial Exercise RX and Referring Provider   Date  07/06/18    Referring Provider  [name]      Treadmill   MPH  1.5    Grade  0    Minutes  15    METs  2.15      Recumbant Bike   Level  2    RPM  60    Watts  5    Minutes  15    METs  1.8      NuStep   Level  2    SPM  80    Minutes  15    METs  2      Prescription Details   Frequency (times per week)  3    Duration  Progress to 45 minutes of aerobic exercise without signs/symptoms of physical distress      Intensity   THRR 40-80% of Max Heartrate  91-124    Ratings of Perceived Exertion  11-13    Perceived Dyspnea  0-4      Resistance Training   Training Prescription  Yes    Weight  2 lb    Reps  10-15       Discharge Exercise Prescription (Final Exercise Prescription Changes): Exercise Prescription Changes - 09/07/18 0800      Response to Exercise   Blood Pressure (Admit)  112/60    Blood Pressure (Exercise)  170/74    Blood Pressure (Exit)  122/62    Heart Rate (Admit)  83 bpm    Heart Rate (Exercise)  101 bpm    Heart Rate (Exit)  81 bpm    Perceived Dyspnea (Exercise)  15    Symptoms  none    Duration  Continue with 45 min of aerobic exercise without signs/symptoms of physical distress.    Intensity  THRR unchanged      Progression   Progression   Continue to progress workloads to maintain intensity without signs/symptoms of physical distress.    Average METs  2.9      Resistance Training   Training Prescription  Yes    Weight  3 lb    Reps  10-15      Interval Training   Interval Training  No      Treadmill   MPH  1.5    Grade  0.5    Minutes  15    METs  2.25      Recumbant Bike   Level  3    RPM  60    Watts  40    Minutes  15    METs  3.57      Home Exercise Plan   Plans to continue exercise at  Home (comment)   walk and handweights   Frequency  Add  2 additional days to program exercise sessions.    Initial Home Exercises Provided  07/26/18       Functional Capacity: 6 Minute Walk    Row Name 07/06/18 1428 09/11/18 1652       6 Minute Walk   Phase  -  Discharge    Distance  1112 feet  1480 feet    Distance % Change  -  33 %    Distance Feet Change  -  368 ft    Walk Time  6 minutes  -    # of Rest Breaks  0  0    MPH  2.1  2.8    METS  1.77  2.32    RPE  11  12    Perceived Dyspnea   0  -    VO2 Peak  6.21  8.13    Symptoms  No  No    Resting HR  58 bpm  61 bpm    Resting BP  114/62  132/64    Resting Oxygen Saturation   95 %  -    Exercise Oxygen Saturation  during 6 min walk  95 %  97 %    Max Ex. HR  80 bpm  54 bpm    Max Ex. BP  114/58  150/82    2 Minute Post BP  104/58  -       Psychological, QOL, Others - Outcomes: PHQ 2/9: Depression screen [facility] 2/9 09/14/2018 07/06/2018  Decreased Interest 0 0  Down, Depressed, Hopeless 0 0  PHQ - 2 Score 0 0  Altered sleeping 0 1  Tired, decreased energy 0 0  Change in appetite 0 0  Feeling bad or failure about yourself  0 0  Trouble concentrating 0 0  Moving slowly or fidgety/restless 0 0  Suicidal thoughts 0 0  PHQ-9 Score 0 1  Difficult doing work/chores - Not difficult at all    Quality of Life: Quality of Life - 09/14/18 1617      Quality of Life   Select  Quality of Life      Quality of Life Scores   Health/Function Post  24  %    Socioeconomic Post  30 %    Psych/Spiritual Post  28.57 %    Family Post  24.6 %    GLOBAL Post  26.31 %       Personal Goals: Goals established at orientation with interventions provided to work toward goal. Personal Goals and Risk Factors at Admission - 07/06/18 1446      Core Components/Risk Factors/Patient Goals on Admission    Weight Management  Yes;Weight Maintenance    Intervention  Weight Management: Develop a combined nutrition and exercise program designed to reach desired caloric intake, while maintaining appropriate intake of nutrient and fiber, sodium and fats, and appropriate energy expenditure required for the weight goal.;Weight Management: Provide education and appropriate resources to help participant work on and attain dietary goals.    Admit Weight  170 lb (77.1 kg)    Expected Outcomes  Long Term: Adherence to nutrition and physical activity/exercise program aimed toward attainment of established weight goal;Weight Maintenance: Understanding of the daily nutrition guidelines, which includes 25-35% calories from fat, 7% or less cal from saturated fats, less than 236m cholesterol, less than 1.5gm of sodium, & 5 or more servings of fruits and vegetables daily;Short Term: Continue to assess and modify interventions until short term weight is achieved;Understanding recommendations for meals to  include 15-35% energy as protein, 25-35% energy from fat, 35-60% energy from carbohydrates, less than 274m of dietary cholesterol, 20-35 gm of total fiber daily;Understanding of distribution of calorie intake throughout the day with the consumption of 4-5 meals/snacks    Hypertension  Yes    Intervention  Provide education on lifestyle modifcations including regular physical activity/exercise, weight management, moderate sodium restriction and increased consumption of fresh fruit, vegetables, and low fat dairy, alcohol moderation, and smoking cessation.;Monitor prescription use  compliance.    Expected Outcomes  Short Term: Continued assessment and intervention until BP is < 140/996mHG in hypertensive participants. < 130/8056mG in hypertensive participants with diabetes, heart failure or chronic kidney disease.;Long Term: Maintenance of blood pressure at goal levels.    Lipids  Yes    Intervention  Provide education and support for participant on nutrition & aerobic/resistive exercise along with prescribed medications to achieve LDL <6m33mDL >40mg38m Expected Outcomes  Short Term: Participant states understanding of desired cholesterol values and is compliant with medications prescribed. Participant is following exercise prescription and nutrition guidelines.;Long Term: Cholesterol controlled with medications as prescribed, with individualized exercise RX and with personalized nutrition plan. Value goals: LDL < 6mg,38m > 40 mg.        Personal Goals Discharge: Goals and Risk Factor Review    Row Name 07/26/18 1736 08/09/18 1742 08/21/18 1657 08/21/18 1824       Core Components/Risk Factors/Patient Goals Review   Personal Goals Review  Improve shortness of breath with ADL's;Lipids;Hypertension  Hypertension;Lipids;Other  Weight Management/Obesity;Other;Lipids;Hypertension  -    Review  Dawsyn Kairontarted back being able to mow the yard for about 30 min at a time.  He does ok with other household chores.  He is taking all meds as directed.  His daughter in law keps his medicine organized for him.  He measures BP at home and reports it usually runs 140/60 or so.    Rayquon Judahking meds as directed.  His daughter in law arranges all his meds to help him take them correctly.  He can tell his legs are stronger since hes been exercising.  He has been having sleep issues.  He feels his sleep got disrupted in the hospital.  He is avoiding caffeine except in the morning.   Ladarion Mavinnues to take meds as directed.  He is doing yard work but not structured exercise at home.     Donzel Alaad multiple asymptomatic PVCs. Rayshad Shivenhe never feels his heart having any type of irregular beat. This evening I faxed all his Cardiac REhab telemetry sheets to Dr. End's Darnelle Bose.    Expected Outcomes  Short - Graeson Neziahcontinue to attend class three days per week Long - [place] will maintan exercise and healthy eating habits  Short - continue to attend HT and use good sleep hygiene Long- achieve better overall sleep pattern  Short - get bike and TM at home ready for use when he finishes HT Long - maintain exercise on his own  Cont to not have s/s with PVCs       Exercise Goals and Review: Exercise Goals    Row Name 07/06/18 1427             Exercise Goals   Increase Physical Activity  Yes       Intervention  Provide advice, education, support and counseling about physical activity/exercise needs.;Develop an individualized exercise prescription for aerobic and resistive training based on  initial evaluation findings, risk stratification, comorbidities and participant's personal goals.       Expected Outcomes  Short Term: Attend rehab on a regular basis to increase amount of physical activity.;Long Term: Add in home exercise to make exercise part of routine and to increase amount of physical activity.;Long Term: Exercising regularly at least 3-5 days a week.       Increase Strength and Stamina  Yes       Intervention  Provide advice, education, support and counseling about physical activity/exercise needs.;Develop an individualized exercise prescription for aerobic and resistive training based on initial evaluation findings, risk stratification, comorbidities and participant's personal goals.       Expected Outcomes  Short Term: Increase workloads from initial exercise prescription for resistance, speed, and METs.;Short Term: Perform resistance training exercises routinely during rehab and add in resistance training at home;Long Term: Improve cardiorespiratory fitness, muscular  endurance and strength as measured by increased METs and functional capacity (6MWT)       Able to understand and use rate of perceived exertion (RPE) scale  Yes       Intervention  Provide education and explanation on how to use RPE scale       Expected Outcomes  Short Term: Able to use RPE daily in rehab to express subjective intensity level;Long Term:  Able to use RPE to guide intensity level when exercising independently       Able to understand and use Dyspnea scale  Yes       Intervention  Provide education and explanation on how to use Dyspnea scale       Expected Outcomes  Short Term: Able to use Dyspnea scale daily in rehab to express subjective sense of shortness of breath during exertion;Long Term: Able to use Dyspnea scale to guide intensity level when exercising independently       Knowledge and understanding of Target Heart Rate Range (THRR)  Yes       Intervention  Provide education and explanation of THRR including how the numbers were predicted and where they are located for reference       Expected Outcomes  Short Term: Able to state/look up THRR;Short Term: Able to use daily as guideline for intensity in rehab;Long Term: Able to use THRR to govern intensity when exercising independently       Able to check pulse independently  Yes       Intervention  Provide education and demonstration on how to check pulse in carotid and radial arteries.;Review the importance of being able to check your own pulse for safety during independent exercise       Expected Outcomes  Short Term: Able to explain why pulse checking is important during independent exercise;Long Term: Able to check pulse independently and accurately       Understanding of Exercise Prescription  Yes       Intervention  Provide education, explanation, and written materials on patient's individual exercise prescription       Expected Outcomes  Short Term: Able to explain program exercise prescription;Long Term: Able to explain  home exercise prescription to exercise independently          Nutrition & Weight - Outcomes: Pre Biometrics - 07/06/18 1426      Pre Biometrics   Height  5' 6.5" (1.689 m)    Weight  172 lb 12.8 oz (78.4 kg)    Waist Circumference  36.5 inches    Hip Circumference  42 inches    Waist  to Hip Ratio  0.87 %    BMI (Calculated)  27.48    Single Leg Stand  4 seconds      Post Biometrics - 09/11/18 1654       Post  Biometrics   Height  5' 6.5" (1.689 m)    Weight  175 lb (79.4 kg)    Waist Circumference  38 inches    Hip Circumference  43 inches    Waist to Hip Ratio  0.88 %    BMI (Calculated)  27.83    Single Leg Stand  7.64 seconds       Nutrition: Nutrition Therapy & Goals - 07/17/18 1632      Nutrition Therapy   Diet  TLC    Drug/Food Interactions  Statins/Certain Fruits    Protein (specify units)  7-8oz   CKD stg III   Fiber  30 grams    Whole Grain Foods  3 servings    Saturated Fats  14 max. grams    Fruits and Vegetables  5 servings/day   8 ideal, does not eat fruit daily   Sodium  1500 grams      Personal Nutrition Goals   Nutrition Goal  Include servings of fruit more often, ideally every day. You can start by consistently adding a banana to your morning Cheerios    Personal Goal #2  Monitor/ limit larger portions of protein foods, as these foods make your kidneys work harder   dx of ckd   Personal Goal #3  Limit sodium intake, and be consistent about the amount of fluid you drink to prevent fluid retention and control blood pressure    Comments  His current diet consists of a variety of protein sources and vegetables but does not eat fruit consistently. He no longer cooks for himself, often bringing home deli plates consisting of a meat + 2 vegetables which he splits between lunch and supper. He does not add salt to food      Intervention Plan   Intervention  Prescribe, educate and counsel regarding individualized specific dietary modifications aiming  towards targeted core components such as weight, hypertension, lipid management, diabetes, heart failure and other comorbidities.;Nutrition handout(s) given to patient.   Sodium food swaps handout   Expected Outcomes  Short Term Goal: Understand basic principles of dietary content, such as calories, fat, sodium, cholesterol and nutrients.;Short Term Goal: A plan has been developed with personal nutrition goals set during dietitian appointment.;Long Term Goal: Adherence to prescribed nutrition plan.       Nutrition Discharge: Nutrition Assessments - 09/14/18 1619      MEDFICTS Scores   Post Score  22       Education Questionnaire Score: Knowledge Questionnaire Score - 07/06/18 1457      Knowledge Questionnaire Score   Pre Score  15/26   correct answers reviewed with [place], focus on nutrition, exercise      Goals reviewed with patient; copy given to patient.

## 2018-09-14 NOTE — Progress Notes (Signed)
Cardiac Individual Treatment Plan  Patient Details  Name: Rodney Wiley MRN: [id number] Date of Birth: Oct 27, 1936 Referring Provider:     Cardiac Rehab from 07/06/2018 in [facility] Cardiac and Pulmonary Rehab  Referring Provider  [name]      Initial Encounter Date:    Cardiac Rehab from 07/06/2018 in [facility] Cardiac and Pulmonary Rehab  Date  07/06/18      Visit Diagnosis: S/P CABG x 3  Patient's Home Medications on Admission:  Current Outpatient Medications:  .  amLODipine (NORVASC) 10 MG tablet, Take 10 mg by mouth daily., Disp: , Rfl:  .  atorvastatin (LIPITOR) 20 MG tablet, Take 1 tablet (20 mg total) by mouth daily., Disp: 90 tablet, Rfl: 3 .  carvedilol (COREG) 6.25 MG tablet, Take 1 tablet (6.25 mg total) by mouth 2 (two) times daily., Disp: 180 tablet, Rfl: 3 .  Cholecalciferol (VITAMIN D3) 2000 units capsule, Take 2,000 Units by mouth daily. , Disp: , Rfl:  .  doxazosin (CARDURA) 2 MG tablet, Take 2 mg by mouth daily., Disp: , Rfl:  .  ferrous sulfate 325 (65 FE) MG EC tablet, Take 325 mg by mouth 2 (two) times daily. , Disp: , Rfl:  .  fluticasone-salmeterol (ADVAIR HFA) 115-21 MCG/ACT inhaler, Inhale 2 puffs into the lungs as needed. Use with spacer/ Aerochamber , Disp: , Rfl:  .  folic acid-pyridoxine-cyancobalamin (FOLTX) 2.5-25-2 MG TABS tablet, Take 1 tablet by mouth daily., Disp: 30 each, Rfl:  .  sennosides-docusate sodium (SENOKOT-S) 8.6-50 MG tablet, Take 1 tablet by mouth 2 (two) times daily. , Disp: , Rfl:  .  torsemide (DEMADEX) 20 MG tablet, Take 1 tablet (20 mg total) by mouth daily as needed., Disp: 180 tablet, Rfl: 3 .  vitamin C (ASCORBIC ACID) 500 MG tablet, Take 500 mg by mouth 2 (two) times daily., Disp: , Rfl:   Past Medical History: Past Medical History:  Diagnosis Date  . Aortic insufficiency    a. 02/2018 s/p bioprosthetic AVR; 04/2018 Echo: EF 50-55%, Gr2 DD, Ao bioprosthesis, mean grad 93mHg, Ao root/Asc Ao nl in size, mild MR, mildly dil LA, nl RV fxn. Nl  PASP.  .[name] KitchenArthropathy   . Ascending aortic aneurysm ([place]    a. 12/2016 MRA: 4.3cm @ sinus of valsalva, 4.9cm above sinotubular jxn; b. 02/2018 s/p biological Bentall and AVR.  .[name] KitchenBPH (benign prostatic hyperplasia)   . 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 02/2018 Cath: LAD 20ost, 40p, 722mLCX 60p, OM2 60, OM3 85, RCA 10p/m w/ L->R and R->R collats;  b. 02/2018 CABG x 3: LIMA to LAD, SVG to OM2, SVG to PDA, EVH via right thigh.  . CKD (chronic kidney disease), stage III ([facility]  . Diverticulitis   . Essential hypertension   . GERD (gastroesophageal reflux disease)   . Hemorrhoids   . History of chicken pox   . History of Helicobacter pylori infection 06/2007  . Hyperlipidemia   . Hypertension   . Left renal artery stenosis (HCC)    a. 01/2017 RA u/s: moderate L RAS.  . [name] Kitchenower extremity edema   . Nonrheumatic mitral (valve) insufficiency    a. 12/2016 Echo: mild to mod MR; b. 09/2017 TEE: mild to mod MR; c. 04/2018 Echo: Mild MR.  . [name] Kitchenonrheumatic pulmonary valve insufficiency   . 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 s/p 21 mm Edwards Inspiris Resilia stented bovine pericardial tissue valve and  24 mm Gelweave Valsalva synthetic root conduit with reimplantation of left main coronary artery.    Tobacco Use: Social History   Tobacco Use  Smoking Status Never Smoker  Smokeless Tobacco Never Used    Labs: Recent Review Flowsheet Data    Labs for ITP Cardiac and Pulmonary Rehab Latest Ref Rng & Units 02/23/2018 02/23/2018 02/23/2018 02/23/2018 02/23/2018   Cholestrol 0 - 200 mg/dL - - - - -   LDLCALC 0 - 99 mg/dL - - - - -   HDL >40 mg/dL - - - - -   Trlycerides <150 mg/dL - - - - -   Hemoglobin A1c 4.8 - 5.6 % - - - - -   PHART 7.350 - 7.450 7.345(L) 7.355 7.400 7.370 -   PCO2ART 32.0 - 48.0 mmHg 35.3 35.4 33.1 32.6 -   HCO3 20.0 - 28.0 mmol/L 19.4(L) 19.7(L) 20.3 18.8(L) -   TCO2 22 - 32 mmol/L 20(L) 21(L) 21(L) 20(L)  21(L)   ACIDBASEDEF 0.0 - 2.0 mmol/L 6.0(H) 5.0(H) 4.0(H) 6.0(H) -   O2SAT % 88.0 96.0 94.0 91.0 -       Exercise Target Goals: Exercise Program Goal: Individual exercise prescription set using results from initial 6 min walk test and THRR while considering  patient's activity barriers and safety.   Exercise Prescription Goal: Initial exercise prescription builds to 30-45 minutes a day of aerobic activity, 2-3 days per week.  Home exercise guidelines will be given to patient during program as part of exercise prescription that the participant will acknowledge.  Activity Barriers & Risk Stratification: Activity Barriers & Cardiac Risk Stratification - 07/06/18 1501      Activity Barriers & Cardiac Risk Stratification   Activity Barriers  Arthritis    Cardiac Risk Stratification  High       6 Minute Walk: 6 Minute Walk    Row Name 07/06/18 1428 09/11/18 1652       6 Minute Walk   Phase  -  Discharge    Distance  1112 feet  1480 feet    Distance % Change  -  33 %    Distance Feet Change  -  368 ft    Walk Time  6 minutes  -    # of Rest Breaks  0  0    MPH  2.1  2.8    METS  1.77  2.32    RPE  11  12    Perceived Dyspnea   0  -    VO2 Peak  6.21  8.13    Symptoms  No  No    Resting HR  58 bpm  61 bpm    Resting BP  114/62  132/64    Resting Oxygen Saturation   95 %  -    Exercise Oxygen Saturation  during 6 min walk  95 %  97 %    Max Ex. HR  80 bpm  54 bpm    Max Ex. BP  114/58  150/82    2 Minute Post BP  104/58  -       Oxygen Initial Assessment:   Oxygen Re-Evaluation:   Oxygen Discharge (Final Oxygen Re-Evaluation):   Initial Exercise Prescription: Initial Exercise Prescription - 07/06/18 1400      Date of Initial Exercise RX and Referring Provider   Date  07/06/18    Referring Provider  [name]      Treadmill   MPH  1.5    Grade  0    Minutes  15    METs  2.15      Recumbant Bike   Level  2    RPM  60    Watts  5    Minutes  15    METs   1.8      NuStep   Level  2    SPM  80    Minutes  15    METs  2      Prescription Details   Frequency (times per week)  3    Duration  Progress to 45 minutes of aerobic exercise without signs/symptoms of physical distress      Intensity   THRR 40-80% of Max Heartrate  91-124    Ratings of Perceived Exertion  11-13    Perceived Dyspnea  0-4      Resistance Training   Training Prescription  Yes    Weight  2 lb    Reps  10-15       Perform Capillary Blood Glucose checks as needed.  Exercise Prescription Changes: Exercise Prescription Changes    Row Name 07/06/18 1400 07/11/18 1500 07/26/18 1200 07/26/18 1700 08/10/18 0900     Response to Exercise   Blood Pressure (Admit)  114/62  122/60  132/84  -  140/78   Blood Pressure (Exercise)  114/58  130/58  158/64  -  146/70   Blood Pressure (Exit)  104/58  132/74  134/72  -  130/66   Heart Rate (Admit)  57 bpm  56 bpm  76 bpm  -  61 bpm   Heart Rate (Exercise)  77 bpm  88 bpm  98 bpm  -  108 bpm   Heart Rate (Exit)  80 bpm  78 bpm  79 bpm  -  71 bpm   Oxygen Saturation (Admit)  98 %  -  -  -  -   Oxygen Saturation (Exercise)  96 %  -  -  -  -   Oxygen Saturation (Exit)  97 %  -  -  -  -   Rating of Perceived Exertion (Exercise)  _0 -  14   Symptoms  -  -  -  -  none   Comments  -  first session  -  -  -   Duration  -  Continue with 45 min of aerobic exercise without signs/symptoms of physical distress.  Continue with 45 min of aerobic exercise without signs/symptoms of physical distress.  -  Continue with 45 min of aerobic exercise without signs/symptoms of physical distress.   Intensity  -  THRR unchanged  THRR unchanged  -  THRR unchanged     Progression   Progression  -  Continue to progress workloads to maintain intensity without signs/symptoms of physical distress.  Continue to progress workloads to maintain intensity without signs/symptoms of physical distress.  -  Continue to progress workloads to maintain  intensity without signs/symptoms of physical distress.   Average METs  -  2.98  2.5  -  2.7     Resistance Training   Training Prescription  -  Yes  Yes  -  Yes   Weight  -  2 lb  3 lb  -  3 lb   Reps  -  10-15  10-15  -  10-15     Interval Training   Interval Training  -  -  No  -  No     Treadmill   MPH  -  -  1.5  -  1.5   Grade  -  -  0  -  0   Minutes  -  -  15  -  15   METs  -  -  2.15  -  2.15     Recumbant Bike   Level  -  5  3  -  3   RPM  -  60  60  -  60   Watts  -  25  29  -  30   Minutes  -  15  15  -  15   METs  -  2.98  2.98  -  3.18     Home Exercise Plan   Plans to continue exercise at  -  -  -  Home (comment) walk and handweights  Home (comment) walk and handweights   Frequency  -  -  -  Add 2 additional days to program exercise sessions.  Add 2 additional days to program exercise sessions.   Initial Home Exercises Provided  -  -  -  07/26/18  07/26/18   Row Name 08/23/18 1300 09/07/18 0800           Response to Exercise   Blood Pressure (Admit)  130/64  112/60      Blood Pressure (Exercise)  142/64  170/74      Blood Pressure (Exit)  138/80  122/62      Heart Rate (Admit)  64 bpm  83 bpm      Heart Rate (Exercise)  110 bpm  101 bpm      Heart Rate (Exit)  80 bpm  81 bpm      Rating of Perceived Exertion (Exercise)  13  -      Perceived Dyspnea (Exercise)  -  15      Symptoms  none  none      Duration  Continue with 45 min of aerobic exercise without signs/symptoms of physical distress.  Continue with 45 min of aerobic exercise without signs/symptoms of physical distress.      Intensity  THRR unchanged  THRR unchanged        Progression   Progression  Continue to progress workloads to maintain intensity without signs/symptoms of physical distress.  Continue to progress workloads to maintain intensity without signs/symptoms of physical distress.      Average METs  2.7  2.9        Resistance Training   Training Prescription  Yes  Yes      Weight  3  lb  3 lb      Reps  10-15  10-15        Interval Training   Interval Training  No  No        Treadmill   MPH  1.5  1.5      Grade  0  0.5      Minutes  15  15      METs  2.15  2.25        Recumbant Bike   Level  3  3      RPM  60  60      Watts  29  40      Minutes  15  15      METs  3.18  3.57        Home Exercise Plan   Plans to  continue exercise at  Home (comment) walk and handweights  Home (comment) walk and handweights      Frequency  Add 2 additional days to program exercise sessions.  Add 2 additional days to program exercise sessions.      Initial Home Exercises Provided  07/26/18  07/26/18         Exercise Comments: Exercise Comments    Row Name 07/10/18 1700 09/14/18 1633         Exercise Comments   First full day of exercise!  Patient was oriented to gym and equipment including functions, settings, policies, and procedures.  Patient's individual exercise prescription and treatment plan were reviewed.  All starting workloads were established based on the results of the 6 minute walk test done at initial orientation visit.  The plan for exercise progression was also introduced and progression will be customized based on patient's performance and goals.  Chanse graduated today from  rehab with 36 sessions completed.  Details of the patient's exercise prescription and what He needs to do in order to continue the prescription and progress were discussed with patient.  Patient was given a copy of prescription and goals.  Patient verbalized understanding.  Wyett plans to continue to exercise by using his treadmill and bike at home.         Exercise Goals and Review: Exercise Goals    Row Name 07/06/18 1427             Exercise Goals   Increase Physical Activity  Yes       Intervention  Provide advice, education, support and counseling about physical activity/exercise needs.;Develop an individualized exercise prescription for aerobic and resistive training based on  initial evaluation findings, risk stratification, comorbidities and participant's personal goals.       Expected Outcomes  Short Term: Attend rehab on a regular basis to increase amount of physical activity.;Long Term: Add in home exercise to make exercise part of routine and to increase amount of physical activity.;Long Term: Exercising regularly at least 3-5 days a week.       Increase Strength and Stamina  Yes       Intervention  Provide advice, education, support and counseling about physical activity/exercise needs.;Develop an individualized exercise prescription for aerobic and resistive training based on initial evaluation findings, risk stratification, comorbidities and participant's personal goals.       Expected Outcomes  Short Term: Increase workloads from initial exercise prescription for resistance, speed, and METs.;Short Term: Perform resistance training exercises routinely during rehab and add in resistance training at home;Long Term: Improve cardiorespiratory fitness, muscular endurance and strength as measured by increased METs and functional capacity (6MWT)       Able to understand and use rate of perceived exertion (RPE) scale  Yes       Intervention  Provide education and explanation on how to use RPE scale       Expected Outcomes  Short Term: Able to use RPE daily in rehab to express subjective intensity level;Long Term:  Able to use RPE to guide intensity level when exercising independently       Able to understand and use Dyspnea scale  Yes       Intervention  Provide education and explanation on how to use Dyspnea scale       Expected Outcomes  Short Term: Able to use Dyspnea scale daily in rehab to express subjective sense of shortness of breath during exertion;Long Term: Able to use Dyspnea scale to guide  intensity level when exercising independently       Knowledge and understanding of Target Heart Rate Range (THRR)  Yes       Intervention  Provide education and explanation of  THRR including how the numbers were predicted and where they are located for reference       Expected Outcomes  Short Term: Able to state/look up THRR;Short Term: Able to use daily as guideline for intensity in rehab;Long Term: Able to use THRR to govern intensity when exercising independently       Able to check pulse independently  Yes       Intervention  Provide education and demonstration on how to check pulse in carotid and radial arteries.;Review the importance of being able to check your own pulse for safety during independent exercise       Expected Outcomes  Short Term: Able to explain why pulse checking is important during independent exercise;Long Term: Able to check pulse independently and accurately       Understanding of Exercise Prescription  Yes       Intervention  Provide education, explanation, and written materials on patient's individual exercise prescription       Expected Outcomes  Short Term: Able to explain program exercise prescription;Long Term: Able to explain home exercise prescription to exercise independently          Exercise Goals Re-Evaluation : Exercise Goals Re-Evaluation    Row Name 07/10/18 1701 07/26/18 1241 07/26/18 1742 08/10/18 0922 08/23/18 1314     Exercise Goal Re-Evaluation   Exercise Goals Review  Increase Physical Activity;Increase Strength and Stamina;Able to understand and use rate of perceived exertion (RPE) scale;Knowledge and understanding of Target Heart Rate Range (THRR)  Increase Physical Activity;Increase Strength and Stamina;Able to understand and use rate of perceived exertion (RPE) scale  Increase Physical Activity;Increase Strength and Stamina;Able to understand and use rate of perceived exertion (RPE) scale;Knowledge and understanding of Target Heart Rate Range (THRR);Able to check pulse independently  Increase Physical Activity;Increase Strength and Stamina;Able to understand and use rate of perceived exertion (RPE) scale;Knowledge and  understanding of Target Heart Rate Range (THRR)  Increase Physical Activity;Increase Strength and Stamina;Able to understand and use rate of perceived exertion (RPE) scale;Knowledge and understanding of Target Heart Rate Range (THRR)   Comments   First full day of exercise!  Patient was oriented to gym and equipment including functions, settings, policies, and procedures.  Patient's individual exercise prescription and treatment plan were reviewed.  All starting workloads were established based on the results of the 6 minute walk test done at initial orientation visit.  The plan for exercise progression was also introduced and progression will be customized based on patient's performance and goals.  Mervin is progressing well with exercise and reaches RPE goals.  HR does not quite reach THR.  Staff will monitor progress.  Rudra plans to walk at home as he has done previously.  He will gradually increase from 15-18 minutes up to 30 minutes.  He will monitor your HR and RPE while exercising at home.  Lannis has improved overall MET level.  He states he feels his legs are stronger since starting exercise.  His HR and RPE are meeting target goals.   Geza attends consistently and tolerates exercise well. He has increased levels on NS and RB and needs to add incline to TM.   Expected Outcomes  Short - use RPE to regulate intensity during sessions Long - follow program prescription  Short - continue  to attend consistently Long - increase overall MET level  Short - Reza will gradually increase walking time Long - Colbin will increase overall MET level  Short - add incline to TM Long - increase MET level  Short - add incline to TM Long - increase MET level overall   Row Name 09/07/18 0822             Exercise Goal Re-Evaluation   Exercise Goals Review  Increase Physical Activity;Increase Strength and Stamina;Able to understand and use rate of perceived exertion (RPE) scale;Able to understand and use Dyspnea scale        Comments  Eliel has increased overall MET level.  He will complete his  discharge 6 min walk next week.       Expected Outcomes  Short - complete HT program Long - maintain exercise on his own          Discharge Exercise Prescription (Final Exercise Prescription Changes): Exercise Prescription Changes - 09/07/18 0800      Response to Exercise   Blood Pressure (Admit)  112/60    Blood Pressure (Exercise)  170/74    Blood Pressure (Exit)  122/62    Heart Rate (Admit)  83 bpm    Heart Rate (Exercise)  101 bpm    Heart Rate (Exit)  81 bpm    Perceived Dyspnea (Exercise)  15    Symptoms  none    Duration  Continue with 45 min of aerobic exercise without signs/symptoms of physical distress.    Intensity  THRR unchanged      Progression   Progression  Continue to progress workloads to maintain intensity without signs/symptoms of physical distress.    Average METs  2.9      Resistance Training   Training Prescription  Yes    Weight  3 lb    Reps  10-15      Interval Training   Interval Training  No      Treadmill   MPH  1.5    Grade  0.5    Minutes  15    METs  2.25      Recumbant Bike   Level  3    RPM  60    Watts  40    Minutes  15    METs  3.57      Home Exercise Plan   Plans to continue exercise at  Home (comment)   walk and handweights   Frequency  Add 2 additional days to program exercise sessions.    Initial Home Exercises Provided  07/26/18       Nutrition:  Target Goals: Understanding of nutrition guidelines, daily intake of sodium <1535m, cholesterol <2045m calories 30% from fat and 7% or less from saturated fats, daily to have 5 or more servings of fruits and vegetables.  Biometrics: Pre Biometrics - 07/06/18 1426      Pre Biometrics   Height  5' 6.5" (1.689 m)    Weight  172 lb 12.8 oz (78.4 kg)    Waist Circumference  36.5 inches    Hip Circumference  42 inches    Waist to Hip Ratio  0.87 %    BMI (Calculated)  27.48    Single Leg Stand  4  seconds      Post Biometrics - 09/11/18 1654       Post  Biometrics   Height  5' 6.5" (1.689 m)    Weight  175 lb (79.4 kg)  Waist Circumference  38 inches    Hip Circumference  43 inches    Waist to Hip Ratio  0.88 %    BMI (Calculated)  27.83    Single Leg Stand  7.64 seconds       Nutrition Therapy Plan and Nutrition Goals: Nutrition Therapy & Goals - 07/17/18 1632      Nutrition Therapy   Diet  TLC    Drug/Food Interactions  Statins/Certain Fruits    Protein (specify units)  7-8oz   CKD stg III   Fiber  30 grams    Whole Grain Foods  3 servings    Saturated Fats  14 max. grams    Fruits and Vegetables  5 servings/day   8 ideal, does not eat fruit daily   Sodium  1500 grams      Personal Nutrition Goals   Nutrition Goal  Include servings of fruit more often, ideally every day. You can start by consistently adding a banana to your morning Cheerios    Personal Goal #2  Monitor/ limit larger portions of protein foods, as these foods make your kidneys work harder   dx of ckd   Personal Goal #3  Limit sodium intake, and be consistent about the amount of fluid you drink to prevent fluid retention and control blood pressure    Comments  His current diet consists of a variety of protein sources and vegetables but does not eat fruit consistently. He no longer cooks for himself, often bringing home deli plates consisting of a meat + 2 vegetables which he splits between lunch and supper. He does not add salt to food      Intervention Plan   Intervention  Prescribe, educate and counsel regarding individualized specific dietary modifications aiming towards targeted core components such as weight, hypertension, lipid management, diabetes, heart failure and other comorbidities.;Nutrition handout(s) given to patient.   Sodium food swaps handout   Expected Outcomes  Short Term Goal: Understand basic principles of dietary content, such as calories, fat, sodium, cholesterol and  nutrients.;Short Term Goal: A plan has been developed with personal nutrition goals set during dietitian appointment.;Long Term Goal: Adherence to prescribed nutrition plan.       Nutrition Assessments: Nutrition Assessments - 09/14/18 1619      MEDFICTS Scores   Post Score  22       Nutrition Goals Re-Evaluation: Nutrition Goals Re-Evaluation    Row Name 07/17/18 1641 08/09/18 1719 09/06/18 1753         Goals   Nutrition Goal  Include servings of fruit more often, ideally every day. You can start by consistently adding a banana to your morning Cheerios  Include servings of fruit more often, ideally daily; Monitor and moderate intake of protein and sodium d/t CKD and heart disease / HTN  Include servings of fruit more often - ideally daily; monitor larger portions of meats; limit sodium intake and be consistent about of the amount of fluids you drink for optimal fluid/electrolyte balance     Comment  He currently eats fruit ~3 days/ week. Typically chooses an apple or a banana  He has been increasing his fruit intake, including options like apples and bananas more regularly. He has also been more conscious of sodium and protein  He has been more serious about dietary changes "the last month or two." He has started to eat bananas and apples at least 3 days/week. He is more mindful about his sodium intake though knows there is sodium  in the meals he eats away from home. He has been eating beans daily for lunch (non-canned) and includes vegetables daily. He eats mainly [place] chicken and salmon for meats     Expected Outcome  He will consume at least 1 serving of fruit daily  He will continue to increast his fruit intake until he is eating multiple servings per day; he will continue to monitor and moderate protein and sodium intake  He will continue to increase fruit intake until he is eating at least 1 serving per day. He will continue to increase awareness about sodium intake and limit as much as  possible. He will keep portions of protiens moderate       Personal Goal #2 Re-Evaluation   Personal Goal #2  Monitor/ limit larger portions of protein foods, as these foods make your kidneys work harder  -  -       Personal Goal #3 Re-Evaluation   Personal Goal #3  Limit sodium intake, and be consistent about the amount of fluid you drink to prevent fluid retention and control blood pressure  -  -        Nutrition Goals Discharge (Final Nutrition Goals Re-Evaluation): Nutrition Goals Re-Evaluation - 09/06/18 1753      Goals   Nutrition Goal  Include servings of fruit more often - ideally daily; monitor larger portions of meats; limit sodium intake and be consistent about of the amount of fluids you drink for optimal fluid/electrolyte balance    Comment  He has been more serious about dietary changes "the last month or two." He has started to eat bananas and apples at least 3 days/week. He is more mindful about his sodium intake though knows there is sodium in the meals he eats away from home. He has been eating beans daily for lunch (non-canned) and includes vegetables daily. He eats mainly [place] chicken and salmon for meats    Expected Outcome  He will continue to increase fruit intake until he is eating at least 1 serving per day. He will continue to increase awareness about sodium intake and limit as much as possible. He will keep portions of protiens moderate       Psychosocial: Target Goals: Acknowledge presence or absence of significant depression and/or stress, maximize coping skills, provide positive support system. Participant is able to verbalize types and ability to use techniques and skills needed for reducing stress and depression.   Initial Review & Psychosocial Screening: Initial Psych Review & Screening - 07/06/18 1453      Initial Review   Current issues with  Current Sleep Concerns;Current Stress Concerns    Comments  Dorell is getting back to being independent living  at home. 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 Now he just wants to get back to doing whatever he pleases.       Family Dynamics   Good Support System?  Yes   children, family     Barriers   Psychosocial barriers to participate in program  There are no identifiable barriers or psychosocial needs.;The patient should benefit from training in stress management and relaxation.      Screening Interventions   Interventions  Encouraged to exercise;Provide feedback about the scores to participant;Program counselor consult;To provide support and resources with identified psychosocial needs    Expected Outcomes  Short Term goal: Utilizing psychosocial counselor, staff and physician to assist with  identification of specific Stressors or current issues interfering with healing process. Setting desired goal for each stressor or current issue identified.;Long Term Goal: Stressors or current issues are controlled or eliminated.;Short Term goal: Identification and review with participant of any Quality of Life or Depression concerns found by scoring the questionnaire.;Long Term goal: The participant improves quality of Life and PHQ9 Scores as seen by post scores and/or verbalization of changes       Quality of Life Scores:  Quality of Life - 09/14/18 1617      Quality of Life   Select  Quality of Life      Quality of Life Scores   Health/Function Post  24 %    Socioeconomic Post  30 %    Psych/Spiritual Post  28.57 %    Family Post  24.6 %    GLOBAL Post  26.31 %      Scores of 19 and below usually indicate a poorer quality of life in these areas.  A difference of  2-3 points is a clinically meaningful difference.  A difference of 2-3 points in the total score of the Quality of Life Index has been associated with significant improvement in overall quality of life, self-image, physical symptoms, and general  health in studies assessing change in quality of life.  PHQ-9: Recent Review Flowsheet Data    Depression screen [facility] 2/9 09/14/2018 07/06/2018   Decreased Interest 0 0   Down, Depressed, Hopeless 0 0   PHQ - 2 Score 0 0   Altered sleeping 0 1   Tired, decreased energy 0 0   Change in appetite 0 0   Feeling bad or failure about yourself  0 0   Trouble concentrating 0 0   Moving slowly or fidgety/restless 0 0   Suicidal thoughts 0 0   PHQ-9 Score 0 1   Difficult doing work/chores - Not difficult at all     Interpretation of Total Score  Total Score Depression Severity:  1-4 = Minimal depression, 5-9 = Mild depression, 10-14 = Moderate depression, 15-19 = Moderately severe depression, 20-27 = Severe depression   Psychosocial Evaluation and Intervention: Psychosocial Evaluation - 07/10/18 1706      Psychosocial Evaluation & Interventions   Interventions  Encouraged to exercise with the program and follow exercise prescription    Comments  Counselor met with Mr. Burkel Zachary - [facility]) today for initial psychosocial evaluation.  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  Javius has a strong support system with a son and daughter-in-law locally; friends and a church community.  He reports sleeping better this past week and an improved appetite.  He denies a history of depression or anxiety or any current symptoms and states he is typically in a positive mood.  Jayquan has goals to return to normal activities and get his strength and stamina back to do so.  Staff will follow with him.     Expected Outcomes  Short:  Tammie will begin to exercise regularly to regain his stamina and strength.   Long:  Orel will develop a habit of exercise to maintain his strength and energy to be able to participate in normal activities.      Continue Psychosocial Services   Follow up required by staff       Psychosocial  Re-Evaluation:   Psychosocial Discharge (Final Psychosocial Re-Evaluation):   Vocational Rehabilitation: Provide vocational rehab  assistance to qualifying candidates.   Vocational Rehab Evaluation & Intervention: Vocational Rehab - 07/06/18 1500      Initial Vocational Rehab Evaluation & Intervention   Assessment shows need for Vocational Rehabilitation  No       Education: Education Goals: Education classes will be provided on a variety of topics geared toward better understanding of heart health and risk factor modification. Participant will state understanding/return demonstration of topics presented as noted by education test scores.  Learning Barriers/Preferences: Learning Barriers/Preferences - 07/06/18 1457      Learning Barriers/Preferences   Learning Barriers  None    Learning Preferences  Group Instruction       Education Topics:  AED/CPR: - Group verbal and written instruction with the use of models to demonstrate the basic use of the AED with the basic ABC's of resuscitation.   Cardiac Rehab from 09/11/2018 in [facility] Cardiac and Pulmonary Rehab  Date  08/21/18  Educator  CE  Instruction Review Code  1- Verbalizes Understanding      General Nutrition Guidelines/Fats and Fiber: -Group instruction provided by verbal, written material, models and posters to present the general guidelines for heart healthy nutrition. Gives an explanation and review of dietary fats and fiber.   Cardiac Rehab from 09/11/2018 in [facility] Cardiac and Pulmonary Rehab  Date  08/14/18  Educator  LB  Instruction Review Code  1- Verbalizes Understanding      Controlling Sodium/Reading Food Labels: -Group verbal and written material supporting the discussion of sodium use in heart healthy nutrition. Review and explanation with models, verbal and written materials for utilization of the food label.   Cardiac Rehab from 09/11/2018 in [facility] Cardiac and Pulmonary Rehab  Date  08/23/18  Educator   LB  Instruction Review Code  1- Verbalizes Understanding      Exercise Physiology & General Exercise Guidelines: - Group verbal and written instruction with models to review the exercise physiology of the cardiovascular system and associated critical values. Provides general exercise guidelines with specific guidelines to those with heart or lung disease.    Cardiac Rehab from 09/11/2018 in [facility] Cardiac and Pulmonary Rehab  Date  08/30/18  Educator  Nada Maclachlan, EP  Instruction Review Code  1- Verbalizes Understanding      Aerobic Exercise & Resistance Training: - Gives group verbal and written instruction on the various components of exercise. Focuses on aerobic and resistive training programs and the benefits of this training and how to safely progress through these programs..   Cardiac Rehab from 09/11/2018 in [facility] Cardiac and Pulmonary Rehab  Date  09/04/18  Educator  [facility]  Instruction Review Code  1- Geologist, engineering, Balance, Mind/Body Relaxation: Provides group verbal/written instruction on the benefits of flexibility and balance training, including mind/body exercise modes such as yoga, pilates and tai chi.  Demonstration and skill practice provided.   Cardiac Rehab from 09/11/2018 in [facility] Cardiac and Pulmonary Rehab  Date  09/11/18  Educator  AS  Instruction Review Code  1- Verbalizes Understanding      Stress and Anxiety: - Provides group verbal and written instruction about the health risks of elevated stress and causes of high stress.  Discuss the correlation between heart/lung disease and anxiety and treatment options. Review healthy ways to manage with stress and anxiety.   Cardiac Rehab from 09/11/2018 in [facility] Cardiac and Pulmonary Rehab  Date  07/26/18  Educator  [facility]  Instruction Review Code  1- Verbalizes Understanding  Depression: - Provides group verbal and written instruction on the correlation between heart/lung disease and  depressed mood, treatment options, and the stigmas associated with seeking treatment.   Cardiac Rehab from 09/11/2018 in [facility] Cardiac and Pulmonary Rehab  Date  07/12/18  Educator  Lucianne Lei, MSW  Instruction Review Code  2- Demonstrated Understanding      Anatomy & Physiology of the Heart: - Group verbal and written instruction and models provide basic cardiac anatomy and physiology, with the coronary electrical and arterial systems. Review of Valvular disease and Heart Failure   Cardiac Procedures: - Group verbal and written instruction to review commonly prescribed medications for heart disease. Reviews the medication, class of the drug, and side effects. Includes the steps to properly store meds and maintain the prescription regimen. (beta blockers and nitrates)   Cardiac Rehab from 09/11/2018 in [facility] Cardiac and Pulmonary Rehab  Date  08/16/18  Educator  [facility]  Instruction Review Code  1- Verbalizes Understanding      Cardiac Medications I: - Group verbal and written instruction to review commonly prescribed medications for heart disease. Reviews the medication, class of the drug, and side effects. Includes the steps to properly store meds and maintain the prescription regimen.   Cardiac Rehab from 09/11/2018 in [facility] Cardiac and Pulmonary Rehab  Date  07/31/18  Educator  CE  Instruction Review Code  1- Verbalizes Understanding      Cardiac Medications II: -Group verbal and written instruction to review commonly prescribed medications for heart disease. Reviews the medication, class of the drug, and side effects. (all other drug classes)   Cardiac Rehab from 09/11/2018 in [facility] Cardiac and Pulmonary Rehab  Date  07/19/18  Educator  [facility]  Instruction Review Code  1- Verbalizes Understanding       Go Sex-Intimacy & Heart Disease, Get SMART - Goal Setting: - Group verbal and written instruction through game format to discuss heart disease and the return to sexual intimacy.  Provides group verbal and written material to discuss and apply goal setting through the application of the S.M.A.R.T. Method.   Cardiac Rehab from 09/11/2018 in [facility] Cardiac and Pulmonary Rehab  Date  08/16/18  Educator  [facility]  Instruction Review Code  1- Verbalizes Understanding      Other Matters of the Heart: - Provides group verbal, written materials and models to describe Stable Angina and Peripheral Artery. Includes description of the disease process and treatment options available to the cardiac patient.   Exercise & Equipment Safety: - Individual verbal instruction and demonstration of equipment use and safety with use of the equipment.   Cardiac Rehab from 09/11/2018 in [facility] Cardiac and Pulmonary Rehab  Date  07/06/18  Educator  [facility]  Instruction Review Code  1- Verbalizes Understanding      Infection Prevention: - Provides verbal and written material to individual with discussion of infection control including proper hand washing and proper equipment cleaning during exercise session.   Cardiac Rehab from 09/11/2018 in [facility] Cardiac and Pulmonary Rehab  Date  07/06/18  Educator  [facility]  Instruction Review Code  1- Verbalizes Understanding      Falls Prevention: - Provides verbal and written material to individual with discussion of falls prevention and safety.   Cardiac Rehab from 09/11/2018 in [facility] Cardiac and Pulmonary Rehab  Date  07/06/18  Educator  [facility]  Instruction Review Code  1- Verbalizes Understanding      Diabetes: - Individual verbal and written instruction to review signs/symptoms of  diabetes, desired ranges of glucose level fasting, after meals and with exercise. Acknowledge that pre and post exercise glucose checks will be done for 3 sessions at entry of program.   Cardiac Rehab from 09/11/2018 in [facility] Cardiac and Pulmonary Rehab  Date  09/06/18  Educator  [facility]  Instruction Review Code  1- Verbalizes Understanding      Know Your Numbers and Risk Factors: -Group  verbal and written instruction about important numbers in your health.  Discussion of what are risk factors and how they play a role in the disease process.  Review of Cholesterol, Blood Pressure, Diabetes, and BMI and the role they play in your overall health.   Cardiac Rehab from 09/11/2018 in [facility] Cardiac and Pulmonary Rehab  Date  07/19/18  Educator  [facility]  Instruction Review Code  1- Verbalizes Understanding      Sleep Hygiene: -Provides group verbal and written instruction about how sleep can affect your health.  Define sleep hygiene, discuss sleep cycles and impact of sleep habits. Review good sleep hygiene tips.    Cardiac Rehab from 09/11/2018 in [facility] Cardiac and Pulmonary Rehab  Date  08/09/18  Educator  [facility]  Instruction Review Code  1- Verbalizes Understanding      Other: -Provides group and verbal instruction on various topics (see comments)   Knowledge Questionnaire Score: Knowledge Questionnaire Score - 07/06/18 1457      Knowledge Questionnaire Score   Pre Score  15/26   correct answers reviewed with Mortimer Fries, focus on nutrition, exercise      Core Components/Risk Factors/Patient Goals at Admission: Personal Goals and Risk Factors at Admission - 07/06/18 1446      Core Components/Risk Factors/Patient Goals on Admission    Weight Management  Yes;Weight Maintenance    Intervention  Weight Management: Develop a combined nutrition and exercise program designed to reach desired caloric intake, while maintaining appropriate intake of nutrient and fiber, sodium and fats, and appropriate energy expenditure required for the weight goal.;Weight Management: Provide education and appropriate resources to help participant work on and attain dietary goals.    Admit Weight  170 lb (77.1 kg)    Expected Outcomes  Long Term: Adherence to nutrition and physical activity/exercise program aimed toward attainment of established weight goal;Weight Maintenance: Understanding of the daily  nutrition guidelines, which includes 25-35% calories from fat, 7% or less cal from saturated fats, less than '200mg'$  cholesterol, less than 1.5gm of sodium, & 5 or more servings of fruits and vegetables daily;Short Term: Continue to assess and modify interventions until short term weight is achieved;Understanding recommendations for meals to include 15-35% energy as protein, 25-35% energy from fat, 35-60% energy from carbohydrates, less than '200mg'$  of dietary cholesterol, 20-35 gm of total fiber daily;Understanding of distribution of calorie intake throughout the day with the consumption of 4-5 meals/snacks    Hypertension  Yes    Intervention  Provide education on lifestyle modifcations including regular physical activity/exercise, weight management, moderate sodium restriction and increased consumption of fresh fruit, vegetables, and low fat dairy, alcohol moderation, and smoking cessation.;Monitor prescription use compliance.    Expected Outcomes  Short Term: Continued assessment and intervention until BP is < 140/23m HG in hypertensive participants. < 130/84mHG in hypertensive participants with diabetes, heart failure or chronic kidney disease.;Long Term: Maintenance of blood pressure at goal levels.    Lipids  Yes    Intervention  Provide education and support for participant on nutrition & aerobic/resistive exercise along with prescribed medications to achieve  LDL '70mg'$ , HDL >'40mg'$ .    Expected Outcomes  Short Term: Participant states understanding of desired cholesterol values and is compliant with medications prescribed. Participant is following exercise prescription and nutrition guidelines.;Long Term: Cholesterol controlled with medications as prescribed, with individualized exercise RX and with personalized nutrition plan. Value goals: LDL < '70mg'$ , HDL > 40 mg.       Core Components/Risk Factors/Patient Goals Review:  Goals and Risk Factor Review    Row Name 07/26/18 1736 08/09/18 1742 08/21/18  1657 08/21/18 1824       Core Components/Risk Factors/Patient Goals Review   Personal Goals Review  Improve shortness of breath with ADL's;Lipids;Hypertension  Hypertension;Lipids;Other  Weight Management/Obesity;Other;Lipids;Hypertension  -    Review  Federick has started back being able to mow the yard for about 30 min at a time.  He does ok with other household chores.  He is taking all meds as directed.  His daughter in law keps his medicine organized for him.  He measures BP at home and reports it usually runs 140/60 or so.    Raequon is taking meds as directed.  His daughter in law arranges all his meds to help him take them correctly.  He can tell his legs are stronger since hes been exercising.  He has been having sleep issues.  He feels his sleep got disrupted in the hospital.  He is avoiding caffeine except in the morning.   Lliam continues to take meds as directed.  He is doing yard work but not structured exercise at home.    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 Rylen said he never feels his heart having any type of irregular beat. This evening I faxed all his Cardiac REhab telemetry sheets to Dr. Darnelle Bos office.    Expected Outcomes  Short - Rayjon will continue to attend class three days per week Long - Vernon will maintan exercise and healthy eating habits  Short - continue to attend HT and use good sleep hygiene Long- achieve better overall sleep pattern  Short - get bike and TM at home ready for use when he finishes HT Long - maintain exercise on his own  Cont to not have s/s with PVCs       Core Components/Risk Factors/Patient Goals at Discharge (Final Review):  Goals and Risk Factor Review - 08/21/18 1824      Core Components/Risk Factors/Patient Goals Review   Review  Norvell has had multiple asymptomatic PVCs. Oz said he never feels his heart having any type of irregular beat. This evening I faxed all his Cardiac REhab telemetry sheets to Dr. Darnelle Bos office.    Expected Outcomes  Cont  to not have s/s with PVCs       ITP Comments: ITP Comments    Row Name 07/06/18 1443 07/19/18 0914 08/16/18 0552 09/13/18 0739 09/14/18 1634   ITP Comments  Med Review completed. Initial ITP created. Diagnosis can be found in [facility] 3/20  30 day review completed. ITP sent to Dr. Ramonita Lab, covering for Dr. Emily Filbert, Medical Director of Cardiac Rehab. Continue with ITP unless changes are made by physician  New to program  30 day review completed. ITP sent to Dr. Emily Filbert, Medical Director of Cardiac Rehab. Continue with ITP unless changes are made by physician  30 day review completed. ITP sent to Dr. Emily Filbert, Medical Director of Cardiac Rehab. Continue with ITP unless changes are made by physician  Discharge ITP sent and signed by Dr. Sabra Heck.  Discharge  Summary routed to PCP and cardiologist.      Comments: Discharge ITP

## 2018-09-15 ENCOUNTER — Encounter: Payer: Self-pay | Admitting: Internal Medicine

## 2018-09-15 ENCOUNTER — Ambulatory Visit: Payer: Medicare HMO | Admitting: Internal Medicine

## 2018-09-15 VITALS — BP 138/58 | HR 54 | Ht 67.0 in | Wt 175.5 lb

## 2018-09-15 DIAGNOSIS — I712 Thoracic aortic aneurysm, without rupture, unspecified: Secondary | ICD-10-CM

## 2018-09-15 DIAGNOSIS — I701 Atherosclerosis of renal artery: Secondary | ICD-10-CM

## 2018-09-15 DIAGNOSIS — E785 Hyperlipidemia, unspecified: Secondary | ICD-10-CM | POA: Diagnosis not present

## 2018-09-15 DIAGNOSIS — I351 Nonrheumatic aortic (valve) insufficiency: Secondary | ICD-10-CM | POA: Diagnosis not present

## 2018-09-15 DIAGNOSIS — Z953 Presence of xenogenic heart valve: Secondary | ICD-10-CM | POA: Diagnosis not present

## 2018-09-15 DIAGNOSIS — I5032 Chronic diastolic (congestive) heart failure: Secondary | ICD-10-CM

## 2018-09-15 DIAGNOSIS — I2510 Atherosclerotic heart disease of native coronary artery without angina pectoris: Secondary | ICD-10-CM

## 2018-09-15 DIAGNOSIS — I150 Renovascular hypertension: Secondary | ICD-10-CM

## 2018-09-15 DIAGNOSIS — I10 Essential (primary) hypertension: Secondary | ICD-10-CM | POA: Diagnosis not present

## 2018-09-15 NOTE — Progress Notes (Signed)
Follow-up Outpatient Visit Date: 09/15/2018  Primary Care Provider: Remi Haggard, [place] [address]  Chief Complaint: Follow-up CAD, valvular heart disease, and HFpEF  HPI:  [name] is a 82 y.o. year-old male with history of  hypertension, coronary artery disease status post CABG (3/19) aortic regurgitation and thoracic aortic aneurysm status post Bentall with bioprosthetic aortic valve replacement (3/19), HFpEF, and chronic kidney disease stage 3with left renal artery stenosis, who presents for follow-up of coronary artery disease, HFpEF, valvular heart disease, and renal artery stenosis.  He was last seen in our office in July by Rodney Bayley, NP, at which time he was doing well other than mild lower extremity edema (right > left).  He completed cardiac rehab yesterday.  Today, Rodney Wiley reports feeling well.  He has noted increased energy over the last 4-6 weeks.  He plans to continue exercising at the [facility], now that cardiac rehab has finished.  He was noted to have intermittent PVC's while exercising at cardiac rehab, though these were asymptomatic.  He notes intermittent sharp pain along the midportion of the sternotomy.  This is not exertional or related to lifting.  He denies shortness of breath, palpitations, and lightheadedness.  Leg edema has been minimal, usually present on the right.  He has not taken torsemide for several weeks and reports that his weight has been stable.  He does not wear compression stocking regularly.  --------------------------------------------------------------------------------------------------  Cardiovascular History & Procedures: Cardiovascular Problems:  Thoracic aortic aneurysm s/p Bentall  Aortic regurgitation s/p bioprosthetic AVR  Mitral regurgitation  Renal artery stenosis  Coronary artery disease s/p CABG  Risk Factors:  Hypertension, hyperlipidemia, male gender, and age >  78  Cath/PCI:  Coronary angiography/RHC (01/16/2018): LMCA normal.  LAD with sequential 20% ostial, 40% proximal, and long 70% proximal to mid stenoses.  LCx with 60% proximal stenosis and 60 to 80-90% OM2 and OM3 lesions.  Chronic total occlusion of the proximal/mid RCA with left-to-right collaterals.  RA 9, RV 43/10, PA 37/15 (22), and PCWP 18.  Fick CO/CI 5.1/2.6.  CV Surgery:  CABG and Bentall with bioprosthetic AVR (02/22/2018): LIMA to LAD, SVG to OM2, and SVG to PDA.  24 mm synthetic aortic root graft and 21 mm Edwards Inspiris [place] stented bovine pericardial tissue valve.  EP Procedures and Devices:  None  Non-Invasive Evaluation(s):  TTE (04/07/18): Normal LV size with mild LVH.  LVEF 50 to 55% with anteroseptal and septal hypokinesis.  Grade 2 diastolic dysfunction.  Bioprosthetic aortic valve present with mean gradient of 17 mmHg.  Mild mitral regurgitation.  Normal RV size and function.  No pericardial effusion.  TEE (09/27/17): Mildly dilated LV with LVEF 60-65%. Moderate aortic regurgitation. Mild to moderate mitral regurgitation. Mild right atrial enlargement. Moderate aortic dilation, with root measuring 5.0 cm and ascending aorta 4.7 cm  MRA chest (06/06/17): Stable ascending aortic aneurysm, measuring 5.0 cm atthe sinotubular junction and 4.8 cm at the level of the main pulmonary artery.  Renal artery Doppler (01/13/17): Aortic atherosclerosis without stenosis or aneurysm. Normal right renal artery. Mild to moderate left renal artery stenosis (1-59%). Normal/symmetric kidney size.  MRA chest (12/27/16): Aneurysmal disease of the ascending aorta, measuring 4.3 cm at the sinus of Valsalva and 4.9 cm above the sinotubular junction.  TTE (12/14/16): Mildly dilated LV with mild LVH. Normal contraction with LVEF 55-60%. Grade 1 diastolic dysfunction. Mild to moderate aortic regurgitation. Dilated aorta, measuring up to 4.9 cm above the sinotubular junction. Mild  to moderate mitral  regurgitation. Mild left atrial enlargement. Normal RV size and function.  Transthoracic echo (09/2016, [place]): Full report not available. Reportedly showed LVH with LVEF 67% and diastolic dysfunction. MR and moderate to severe AI present with sclerotic aortic valve.  Carotid Doppler (10/201&): Full report not available. Reportedly showed 1-39% stenosis bilaterally.  Transthoracic echo (02/18/16, [facility]): Normal LV and RV size and function. LVEF >55%. Moderate AI; mild MR and TR.  Recent CV Pertinent Labs: Lab Results  Component Value Date   CHOL 106 03/11/2017   CHOL 186 01/13/2017   HDL 28 (L) 03/11/2017   HDL 30 (L) 01/13/2017   LDLCALC 39 03/11/2017   LDLCALC 95 01/13/2017   TRIG 194 (H) 03/11/2017   CHOLHDL 3.8 03/11/2017   INR 2.9 05/24/2018   INR 7.53 (HH) 03/27/2018   K 3.5 06/01/2018   MG 2.8 (H) 02/24/2018   BUN 33 (H) 06/01/2018   BUN 43 (H) 05/24/2018   CREATININE 2.14 (H) 06/01/2018    Past medical and surgical history were reviewed and updated in EPIC.  Current Meds  Medication Sig  . amLODipine (NORVASC) 10 MG tablet Take 10 mg by mouth daily.  [name] Kitchen aspirin EC 81 MG tablet Take 81 mg by mouth daily.  [name] Kitchen atorvastatin (LIPITOR) 20 MG tablet Take 1 tablet (20 mg total) by mouth daily.  . carvedilol (COREG) 6.25 MG tablet Take 1 tablet (6.25 mg total) by mouth 2 (two) times daily.  . Cholecalciferol (VITAMIN D3) 2000 units capsule Take 2,000 Units by mouth daily.   [name] Kitchen doxazosin (CARDURA) 2 MG tablet Take 2 mg by mouth daily.  . ferrous sulfate 325 (65 FE) MG EC tablet Take 325 mg by mouth 2 (two) times daily.   . sennosides-docusate sodium (SENOKOT-S) 8.6-50 MG tablet Take 1 tablet by mouth 2 (two) times daily.   . vitamin C (ASCORBIC ACID) 500 MG tablet Take 500 mg by mouth 2 (two) times daily.    Allergies: Patient has no known allergies.  Social History   Tobacco Use  . Smoking status: Never Smoker  . Smokeless tobacco:  Never Used  Substance Use Topics  . Alcohol use: No  . Drug use: No    Family History  Problem Relation Age of Onset  . Hypertension Mother   . Stroke Mother   . Leukemia Father   . Heart attack Paternal Grandmother   . Stroke Paternal Grandfather   . Heart Problems Son 64  . Heart Problems Son 72  . Uterine cancer Sister   . Prostate cancer Brother   . Colon cancer Brother   . Stroke Sister     Review of Systems: A 12-system review of systems was performed and was negative except as noted in the HPI.  --------------------------------------------------------------------------------------------------  Physical Exam: BP (!) 138/58 (BP Location: Left Arm, Patient Position: Sitting, Cuff Size: Normal)   Pulse (!) 54   Ht 5\' 7"  (1.702 m)   Wt 175 lb 8 oz (79.6 kg)   BMI 27.49 kg/m   General:  NAD HEENT: No conjunctival pallor or scleral icterus. Moist mucous membranes.  OP clear. Neck: Supple without lymphadenopathy, thyromegaly, JVD, or HJR. Lungs: Normal work of breathing. Clear to auscultation bilaterally without wheezes or crackles. Heart: Regular rate and rhythm without murmurs, rubs, or gallops. Non-displaced PMI. Abd: Bowel sounds present. Soft, NT/ND without hepatosplenomegaly Ext: Trace to 1+ ankle edema, right > left. Skin: Warm and dry without rash.  Medial sternotomy well-healed with  mild tenderness overlying mid section of the incision.  No sternal mobility.  EKG:  NSR with 1st degree AVB, left axis deviation, and poor R wave progression.  Lab Results  Component Value Date   WBC 6.2 03/27/2018   HGB 10.9 (L) 03/27/2018   HCT 31.7 (L) 03/27/2018   MCV 93.5 03/27/2018   PLT 135 (L) 03/27/2018    Lab Results  Component Value Date   NA 139 06/01/2018   K 3.5 06/01/2018   CL 107 06/01/2018   CO2 23 06/01/2018   BUN 33 (H) 06/01/2018   CREATININE 2.14 (H) 06/01/2018   GLUCOSE 107 (H) 06/01/2018   ALT 28 03/24/2018    Lab Results  Component Value  Date   CHOL 106 03/11/2017   HDL 28 (L) 03/11/2017   LDLCALC 39 03/11/2017   TRIG 194 (H) 03/11/2017   CHOLHDL 3.8 03/11/2017    --------------------------------------------------------------------------------------------------  ASSESSMENT AND PLAN: Coronary artery disease No angina.  Intemrittent chest pain is most likely due to healing of sternotomy.  I do not appreciate any instability of the sternotomy.  If symptoms worsen, we will need to consider unenhanced CT of the chest to excluded sternal non-union and/or referral back to Dr. Roxy Manns.  Continue secondary prevention.  Aortic regurgitation status post bioprosthetic AVR No clinical evidence of heart failure today.  Continue ASA and SBE prophylaxis.  Thoracic aortic aneurysm Status post root replacement (Bentall procedure).  Continue BP and lipid control.  Follow-up with Dr. Roxy Manns as previously arranged.  HFpEF Other than mild chronic ankle edema (R>L), Rodney Wiley appears euvolemic with NYHA class II symptoms.  I will check his renal function and electrolytes today, given CKD and recent PVC's noted at cardiac rehab.  No medication changes today.  Renal artery stenosis and hypertension BP upper normal today.  Continue current medications.  We will check a CMP today.  Hyperlipidemia LDL at goal.  Continue atorvastatin 20 mg daily.  Follow-up: Return to clinic in 3 months.  Nelva Bush, MD 09/15/2018 4:00 PM

## 2018-09-15 NOTE — Patient Instructions (Signed)
Medication Instructions:  Your physician recommends that you continue on your current medications as directed. Please refer to the Current Medication list given to you today.  If you need a refill on your cardiac medications before your next appointment, please call your pharmacy.   Lab work: Your physician recommends that you return for lab work in: TODAY - CBC, CMP, LIPID, Mg.  If you have labs (blood work) drawn today and your tests are completely normal, you will receive your results only by: [name] Kitchen MyChart Message (if you have MyChart) OR . A paper copy in the mail If you have any lab test that is abnormal or we need to change your treatment, we will call you to review the results.  Testing/Procedures: none  Follow-Up: At [facility], you and your health needs are our priority.  As part of our continuing mission to provide you with exceptional heart care, we have created designated Provider Care Teams.  These Care Teams include your primary Cardiologist (physician) and Advanced Practice Providers (APPs -  Physician Assistants and Nurse Practitioners) who all work together to provide you with the care you need, when you need it. You will need a follow up appointment in 3 months.  Please call our office 2 months in advance to schedule this appointment.  You may see Nelva Bush, MD or one of the following Advanced Practice Providers on your designated Care Team:   Murray Hodgkins, NP Christell Faith, PA-C . Marrianne Mood, PA-C

## 2018-09-16 LAB — COMPREHENSIVE METABOLIC PANEL
ALT: 14 IU/L (ref 0–44)
AST: 18 IU/L (ref 0–40)
Albumin/Globulin Ratio: 1.6 (ref 1.2–2.2)
Albumin: 4.4 g/dL (ref 3.5–4.7)
Alkaline Phosphatase: 71 IU/L (ref 39–117)
BUN/Creatinine Ratio: 15 (ref 10–24)
BUN: 33 mg/dL — ABNORMAL HIGH (ref 8–27)
Bilirubin Total: 1 mg/dL (ref 0.0–1.2)
CO2: 18 mmol/L — ABNORMAL LOW (ref 20–29)
Calcium: 9 mg/dL (ref 8.6–10.2)
Chloride: 103 mmol/L (ref 96–106)
Creatinine, Ser: 2.27 mg/dL — ABNORMAL HIGH (ref 0.76–1.27)
GFR calc Af Amer: 30 mL/min/{1.73_m2} — ABNORMAL LOW (ref >59)
GFR calc non Af Amer: 26 mL/min/{1.73_m2} — ABNORMAL LOW (ref >59)
Globulin, Total: 2.7 g/dL (ref 1.5–4.5)
Glucose: 105 mg/dL — ABNORMAL HIGH (ref 65–99)
Potassium: 4.4 mmol/L (ref 3.5–5.2)
Sodium: 139 mmol/L (ref 134–144)
Total Protein: 7.1 g/dL (ref 6.0–8.5)

## 2018-09-16 LAB — CBC
Hematocrit: 33 % — ABNORMAL LOW (ref 37.5–51.0)
Hemoglobin: 11.3 g/dL — ABNORMAL LOW (ref 13.0–17.7)
MCH: 32.8 pg (ref 26.6–33.0)
MCHC: 34.2 g/dL (ref 31.5–35.7)
MCV: 96 fL (ref 79–97)
Platelets: 150 10*3/uL (ref 150–450)
RBC: 3.45 x10E6/uL — ABNORMAL LOW (ref 4.14–5.80)
RDW: 13 % (ref 12.3–15.4)
WBC: 5.5 10*3/uL (ref 3.4–10.8)

## 2018-09-16 LAB — LIPID PANEL
Chol/HDL Ratio: 4 ratio (ref 0.0–5.0)
Cholesterol, Total: 132 mg/dL (ref 100–199)
HDL: 33 mg/dL — ABNORMAL LOW (ref >39)
LDL Calculated: 56 mg/dL (ref 0–99)
Triglycerides: 214 mg/dL — ABNORMAL HIGH (ref 0–149)
VLDL Cholesterol Cal: 43 mg/dL — ABNORMAL HIGH (ref 5–40)

## 2018-09-16 LAB — MAGNESIUM: Magnesium: 2.5 mg/dL — ABNORMAL HIGH (ref 1.6–2.3)

## 2018-09-20 ENCOUNTER — Ambulatory Visit: Payer: Medicare HMO | Admitting: Internal Medicine

## 2018-09-25 ENCOUNTER — Other Ambulatory Visit: Payer: Self-pay | Admitting: Internal Medicine

## 2018-09-26 ENCOUNTER — Other Ambulatory Visit: Payer: Self-pay | Admitting: *Deleted

## 2018-09-26 MED ORDER — DOXAZOSIN MESYLATE 2 MG PO TABS: 2.0000 mg | ORAL_TABLET | Freq: Every day | ORAL | 2 refills | Status: DC

## 2018-09-26 NOTE — Telephone Encounter (Signed)
Please advise if ok to refill Doxazosin 2 mg tablet qd.

## 2018-09-27 NOTE — Telephone Encounter (Signed)
Already refilled on 09/26/18.

## 2018-10-06 ENCOUNTER — Encounter: Payer: Self-pay | Admitting: Oncology

## 2018-10-06 ENCOUNTER — Inpatient Hospital Stay: Payer: Medicare HMO

## 2018-10-06 ENCOUNTER — Inpatient Hospital Stay: Payer: Medicare HMO | Attending: Oncology | Admitting: Oncology

## 2018-10-06 ENCOUNTER — Other Ambulatory Visit: Payer: Self-pay

## 2018-10-06 VITALS — BP 148/69 | HR 70 | Temp 97.9°F | Resp 18 | Ht 67.0 in | Wt 179.7 lb

## 2018-10-06 DIAGNOSIS — E785 Hyperlipidemia, unspecified: Secondary | ICD-10-CM

## 2018-10-06 DIAGNOSIS — D696 Thrombocytopenia, unspecified: Secondary | ICD-10-CM

## 2018-10-06 DIAGNOSIS — N400 Enlarged prostate without lower urinary tract symptoms: Secondary | ICD-10-CM

## 2018-10-06 DIAGNOSIS — Z7982 Long term (current) use of aspirin: Secondary | ICD-10-CM | POA: Diagnosis not present

## 2018-10-06 DIAGNOSIS — N183 Chronic kidney disease, stage 3 (moderate): Secondary | ICD-10-CM

## 2018-10-06 DIAGNOSIS — I10 Essential (primary) hypertension: Secondary | ICD-10-CM

## 2018-10-06 DIAGNOSIS — D631 Anemia in chronic kidney disease: Secondary | ICD-10-CM

## 2018-10-06 DIAGNOSIS — Z79899 Other long term (current) drug therapy: Secondary | ICD-10-CM | POA: Diagnosis not present

## 2018-10-06 DIAGNOSIS — N189 Chronic kidney disease, unspecified: Secondary | ICD-10-CM

## 2018-10-06 LAB — CBC WITH DIFFERENTIAL/PLATELET
Abs Immature Granulocytes: 0.02 10*3/uL (ref 0.00–0.07)
Basophils Absolute: 0 10*3/uL (ref 0.0–0.1)
Basophils Relative: 0 %
Eosinophils Absolute: 0.2 10*3/uL (ref 0.0–0.5)
Eosinophils Relative: 2 %
HCT: 29.8 % — ABNORMAL LOW (ref 39.0–52.0)
Hemoglobin: 9.9 g/dL — ABNORMAL LOW (ref 13.0–17.0)
Immature Granulocytes: 0 %
Lymphocytes Relative: 18 %
Lymphs Abs: 1.2 10*3/uL (ref 0.7–4.0)
MCH: 31.7 pg (ref 26.0–34.0)
MCHC: 33.2 g/dL (ref 30.0–36.0)
MCV: 95.5 fL (ref 80.0–100.0)
Monocytes Absolute: 0.7 10*3/uL (ref 0.1–1.0)
Monocytes Relative: 11 %
Neutro Abs: 4.5 10*3/uL (ref 1.7–7.7)
Neutrophils Relative %: 69 %
Platelets: 122 10*3/uL — ABNORMAL LOW (ref 150–400)
RBC: 3.12 MIL/uL — ABNORMAL LOW (ref 4.22–5.81)
RDW: 13 % (ref 11.5–15.5)
WBC: 6.6 10*3/uL (ref 4.0–10.5)
nRBC: 0 % (ref 0.0–0.2)

## 2018-10-06 NOTE — Progress Notes (Signed)
No new changes noted today 

## 2018-10-09 NOTE — Progress Notes (Signed)
Hematology/Oncology Consult note [facility]  [phone] [phone]  Patient Care Team: Remi Haggard, FNP as PCP - General (Family Medicine) End, Harrell Gave, MD as PCP - Cardiology (Cardiology)   Name of the patient: Rodney Wiley  [id number]  26-Nov-1936   Date of visit: 10/09/18  Diagnosis- 1. Anemia likely due to CKD 2. 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 He recently underwent bioprosthetic aortic valve replacement, CABG x3 and resuspension of the left atrium on 02/22/2018. He has been referred to [place] for evaluation of thrombocytopenia.  He is recovering slowly from his recent procedure. He reports fatigue and some good days and some not so good days when he is more fatigued.He denies any chest pain or exertional shortness of breath. Denies any bleeding or bruising. He follows up with nephrology for his chronic kidney disease  Results of blood work from 03/24/2018 were as follows: CBC showed white count of 7.7, H&H 11.4/34.1 with an MCV of 93.9 and a platelet count of 134.  CMP showed elevated creatinine of 2.4.  AST and ALT were normal.  Folate and B12 are normal.  Ferritin and iron studies were normal.  Haptoglobin and TSH was normal.  Multiple myeloma panel revealed no monoclonal protein.  Interval history-he feels well today and denies any complaints at this time.  Appetite is good and weight is stable.  He has mild fatigue which is overall stable  ECOG PS- 1 Pain scale- 0 Opioid associated constipation- no  Review of systems- Review of Systems  Constitutional: Positive for malaise/fatigue. Negative for chills, fever and weight  loss.  HENT: Negative for congestion, ear discharge and nosebleeds.   Eyes: Negative for blurred vision.  Respiratory: Negative for cough, hemoptysis, sputum production, shortness of breath and wheezing.   Cardiovascular: Negative for chest pain, palpitations, orthopnea and claudication.  Gastrointestinal: Negative for abdominal pain, blood in stool, constipation, diarrhea, heartburn, melena, nausea and vomiting.  Genitourinary: Negative for dysuria, flank pain, frequency, hematuria and urgency.  Musculoskeletal: Negative for back pain, joint pain and myalgias.  Skin: Negative for rash.  Neurological: Negative for dizziness, tingling, focal weakness, seizures, weakness and headaches.  Endo/Heme/Allergies: Does not bruise/bleed easily.  Psychiatric/Behavioral: Negative for depression and suicidal ideas. The patient does not have insomnia.       No Known Allergies   Past Medical History:  Diagnosis Date  . Aortic insufficiency    a. 02/2018 s/p bioprosthetic AVR; 04/2018 Echo: EF 50-55%, Gr2 DD, Ao bioprosthesis, mean grad 30mHg, Ao root/Asc Ao nl in size, mild MR, mildly dil LA, nl RV fxn. Nl PASP.  .[name] KitchenArthropathy   . Ascending aortic aneurysm ([place]    a. 12/2016 MRA: 4.3cm @ sinus of valsalva, 4.9cm above sinotubular jxn; b. 02/2018 s/p biological Bentall and AVR.  .[name] KitchenBPH (benign prostatic hyperplasia)   . 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 02/2018 Cath: LAD 20ost, 40p, 717mLCX 60p, OM2 60, OM3 85, RCA 10p/m w/ L->R and R->R collats;  b. 02/2018 CABG x 3: LIMA to LAD, SVG to OM2, SVG to PDA, EVH via right thigh.  . CKD (chronic kidney disease), stage III ([facility]  . Diverticulitis   . Essential hypertension   .  GERD (gastroesophageal reflux disease)   . Hemorrhoids   . History of chicken pox   . History of Helicobacter pylori infection 06/2007  . Hyperlipidemia   . Hypertension   . Left renal artery stenosis (HCC)    a. 01/2017 RA u/s: moderate L RAS.  [name] Kitchen Lower extremity edema   .  Nonrheumatic mitral (valve) insufficiency    a. 12/2016 Echo: mild to mod MR; b. 09/2017 TEE: mild to mod MR; c. 04/2018 Echo: Mild MR.  [name] Kitchen Nonrheumatic pulmonary valve insufficiency   . 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 s/p 21 mm Edwards Inspiris [place] stented bovine pericardial tissue valve and 24 mm Gelweave Valsalva synthetic root conduit with reimplantation of left main coronary artery.     Past Surgical History:  Procedure Laterality Date  . AORTIC VALVE REPAIR N/A 02/22/2018   Procedure: AORTIC VALVE REPLACEMENT -Biological Bentall aortic root replacement,;  Surgeon: Rexene Alberts, MD;  Location: [place];  Service: Open Heart Surgery;  Laterality: N/A;  . bypass surgery    . CARDIAC CATHETERIZATION     01/16/2018  . COLONOSCOPY  2011   by Dr. Candace Cruise with findings of diverticulosis  . CORONARY ARTERY BYPASS GRAFT N/A 02/22/2018   Procedure: CORONARY ARTERY BYPASS GRAFTING (CABG) x three, using left internal mammary artery and right leg greater saphenous vein harvested endoscopically;  Surgeon: Rexene Alberts, MD;  Location: [place];  Service: Open Heart Surgery;  Laterality: N/A;  . CORONARY/GRAFT ANGIOGRAPHY N/A 01/16/2018   Procedure: CORONARY/GRAFT ANGIOGRAPHY;  Surgeon: Nelva Bush, MD;  Location: [place] CV LAB;  Service: Cardiovascular;  Laterality: N/A;  . ELBOW SURGERY Left   . hemorhoidectomy    . RIGHT HEART CATH N/A 01/16/2018   Procedure: RIGHT HEART CATH;  Surgeon: Nelva Bush, MD;  Location: [place] CV LAB;  Service: Cardiovascular;  Laterality: N/A;  . SHOULDER ARTHROSCOPY WITH ROTATOR CUFF REPAIR Right   . SHOULDER SURGERY Right   . TEE WITHOUT CARDIOVERSION N/A 09/27/2017   Procedure: TRANSESOPHAGEAL ECHOCARDIOGRAM (TEE);  Surgeon: Dorothy Spark, MD;  Location: [facility] ENDOSCOPY;  Service: Cardiovascular;  Laterality: N/A;  . TEE WITHOUT CARDIOVERSION N/A 02/22/2018    Procedure: TRANSESOPHAGEAL ECHOCARDIOGRAM (TEE);  Surgeon: Rexene Alberts, MD;  Location: [place];  Service: Open Heart Surgery;  Laterality: N/A;  . THORACIC AORTIC ANEURYSM REPAIR N/A 02/22/2018   Procedure: THORACIC ASCENDING ANEURYSM REPAIR (AAA) Resection of ascending aorta aneurysm;  Surgeon: Rexene Alberts, MD;  Location: [facility] OR;  Service: Open Heart Surgery;  Laterality: N/A;    Social History   Socioeconomic History  . Marital status: Unknown    Spouse name: Not on file  . Number of children: Not on file  . Years of education: Not on file  . Highest education level: Not on file  Occupational History  . Not on file  Social Needs  . Financial resource strain: Not on file  . Food insecurity:    Worry: Not on file    Inability: Not on file  . Transportation needs:    Medical: Not on file    Non-medical: Not on file  Tobacco Use  . Smoking status: Never Smoker  . Smokeless tobacco: Never Used  Substance and Sexual Activity  . Alcohol use: No  . Drug use: No  . Sexual activity: Not on file  Lifestyle  . Physical activity:    Days per week: Not on file  Minutes per session: Not on file  . Stress: Not on file  Relationships  . Social connections:    Talks on phone: Not on file    Gets together: Not on file    Attends religious service: Not on file    Active member of club or organization: Not on file    Attends meetings of clubs or organizations: Not on file    Relationship status: Not on file  . Intimate partner violence:    Fear of current or ex partner: Not on file    Emotionally abused: Not on file    Physically abused: Not on file    Forced sexual activity: Not on file  Other Topics Concern  . Not on file  Social History Narrative  . Not on file    Family History  Problem Relation Age of Onset  . Hypertension Mother   . Stroke Mother   . Leukemia Father   . Heart attack Paternal Grandmother   . Stroke Paternal Grandfather   . Heart Problems Son 63    . Heart Problems Son 72  . Uterine cancer Sister   . Prostate cancer Brother   . Colon cancer Brother   . Stroke Sister      Current Outpatient Medications:  .  amLODipine (NORVASC) 10 MG tablet, Take 10 mg by mouth daily., Disp: , Rfl:  .  aspirin EC 81 MG tablet, Take 81 mg by mouth daily., Disp: , Rfl:  .  atorvastatin (LIPITOR) 20 MG tablet, Take 1 tablet (20 mg total) by mouth daily., Disp: 90 tablet, Rfl: 3 .  carvedilol (COREG) 6.25 MG tablet, Take 1 tablet (6.25 mg total) by mouth 2 (two) times daily., Disp: 180 tablet, Rfl: 3 .  Cholecalciferol (VITAMIN D3) 2000 units capsule, Take 2,000 Units by mouth daily. , Disp: , Rfl:  .  doxazosin (CARDURA) 2 MG tablet, Take 1 tablet (2 mg total) by mouth daily., Disp: 90 tablet, Rfl: 2 .  ferrous sulfate 325 (65 FE) MG EC tablet, Take 325 mg by mouth 2 (two) times daily. , Disp: , Rfl:  .  sennosides-docusate sodium (SENOKOT-S) 8.6-50 MG tablet, Take 1 tablet by mouth 2 (two) times daily. , Disp: , Rfl:  .  vitamin C (ASCORBIC ACID) 500 MG tablet, Take 500 mg by mouth 2 (two) times daily., Disp: , Rfl:  .  torsemide (DEMADEX) 20 MG tablet, Take 1 tablet (20 mg total) by mouth daily as needed. (Patient not taking: Reported on 10/06/2018), Disp: 180 tablet, Rfl: 3  Physical exam:  Vitals:   10/06/18 1448  BP: (!) 148/69  Pulse: 70  Resp: 18  Temp: 97.9 F (36.6 C)  TempSrc: Tympanic  SpO2: 96%  Weight: 179 lb 11.2 oz (81.5 kg)  Height: '5\' 7"'  (1.702 m)   Physical Exam  Constitutional: He is oriented to person, place, and time. He appears well-developed and well-nourished.  HENT:  Head: Normocephalic and atraumatic.  Eyes: Pupils are equal, round, and reactive to light. EOM are normal.  Neck: Normal range of motion.  Cardiovascular: Normal rate, regular rhythm and normal heart sounds.  Pulmonary/Chest: Effort normal and breath sounds normal.  Abdominal: Soft. Bowel sounds are normal.  Musculoskeletal: He exhibits edema.   Neurological: He is alert and oriented to person, place, and time.  Skin: Skin is warm and dry.     CMP Latest Ref Rng & Units 09/15/2018  Glucose 65 - 99 mg/dL 105(H)  BUN 8 - 27 mg/dL  33(H)  Creatinine 0.76 - 1.27 mg/dL 2.27(H)  Sodium 134 - 144 mmol/L 139  Potassium 3.5 - 5.2 mmol/L 4.4  Chloride 96 - 106 mmol/L 103  CO2 20 - 29 mmol/L 18(L)  Calcium 8.6 - 10.2 mg/dL 9.0  Total Protein 6.0 - 8.5 g/dL 7.1  Total Bilirubin 0.0 - 1.2 mg/dL 1.0  Alkaline Phos 39 - 117 IU/L 71  AST 0 - 40 IU/L 18  ALT 0 - 44 IU/L 14   CBC Latest Ref Rng & Units 10/06/2018  WBC 4.0 - 10.5 K/uL 6.6  Hemoglobin 13.0 - 17.0 g/dL 9.9(L)  Hematocrit 39.0 - 52.0 % 29.8(L)  Platelets 150 - 400 K/uL 122(L)      Assessment and plan- Patient is a 82 y.o. male with following issues:  1.  Anemia secondary to chronic kidney disease: Hemoglobin is remaining stable between 9-10 over the last 6 months.  He does not require EPO at this time.  Repeat CBC ferritin and iron studies and CMP in 6 months and I will see him back in 6 months  2.  Thrombocytopenia: He has some mild intermittent thrombocytopenia and his platelet counts have been actuating between 122 - 200.  Continue to monitor   Visit Diagnosis 1. Anemia in chronic kidney disease, unspecified CKD stage   2. Thrombocytopenia ([place])      Dr. Randa Evens, MD, MPH [facility] [phone] 10/09/2018 12:01 PM

## 2018-10-17 ENCOUNTER — Encounter: Payer: Self-pay | Admitting: *Deleted

## 2018-10-17 ENCOUNTER — Other Ambulatory Visit: Payer: Self-pay | Admitting: Internal Medicine

## 2018-10-23 DIAGNOSIS — K640 First degree hemorrhoids: Secondary | ICD-10-CM | POA: Diagnosis not present

## 2018-10-23 DIAGNOSIS — K5730 Diverticulosis of large intestine without perforation or abscess without bleeding: Secondary | ICD-10-CM | POA: Diagnosis not present

## 2018-10-23 DIAGNOSIS — Z800 Family history of malignant neoplasm of digestive organs: Secondary | ICD-10-CM | POA: Diagnosis not present

## 2018-10-26 IMAGING — CR DG CHEST 2V
2 series · 2 of 2 positions shown · non-contrast
Comparison: Chest x-rays dated 03/01/2018 and 02/20/2018

CLINICAL DATA: Status post aortic valve repair and thoracic aortic
aneurysm repair on 02/22/2018. Status post coronary artery bypass
grafts on 02/22/2018.

EXAM:
CHEST - 2 VIEW

[w chest pa]
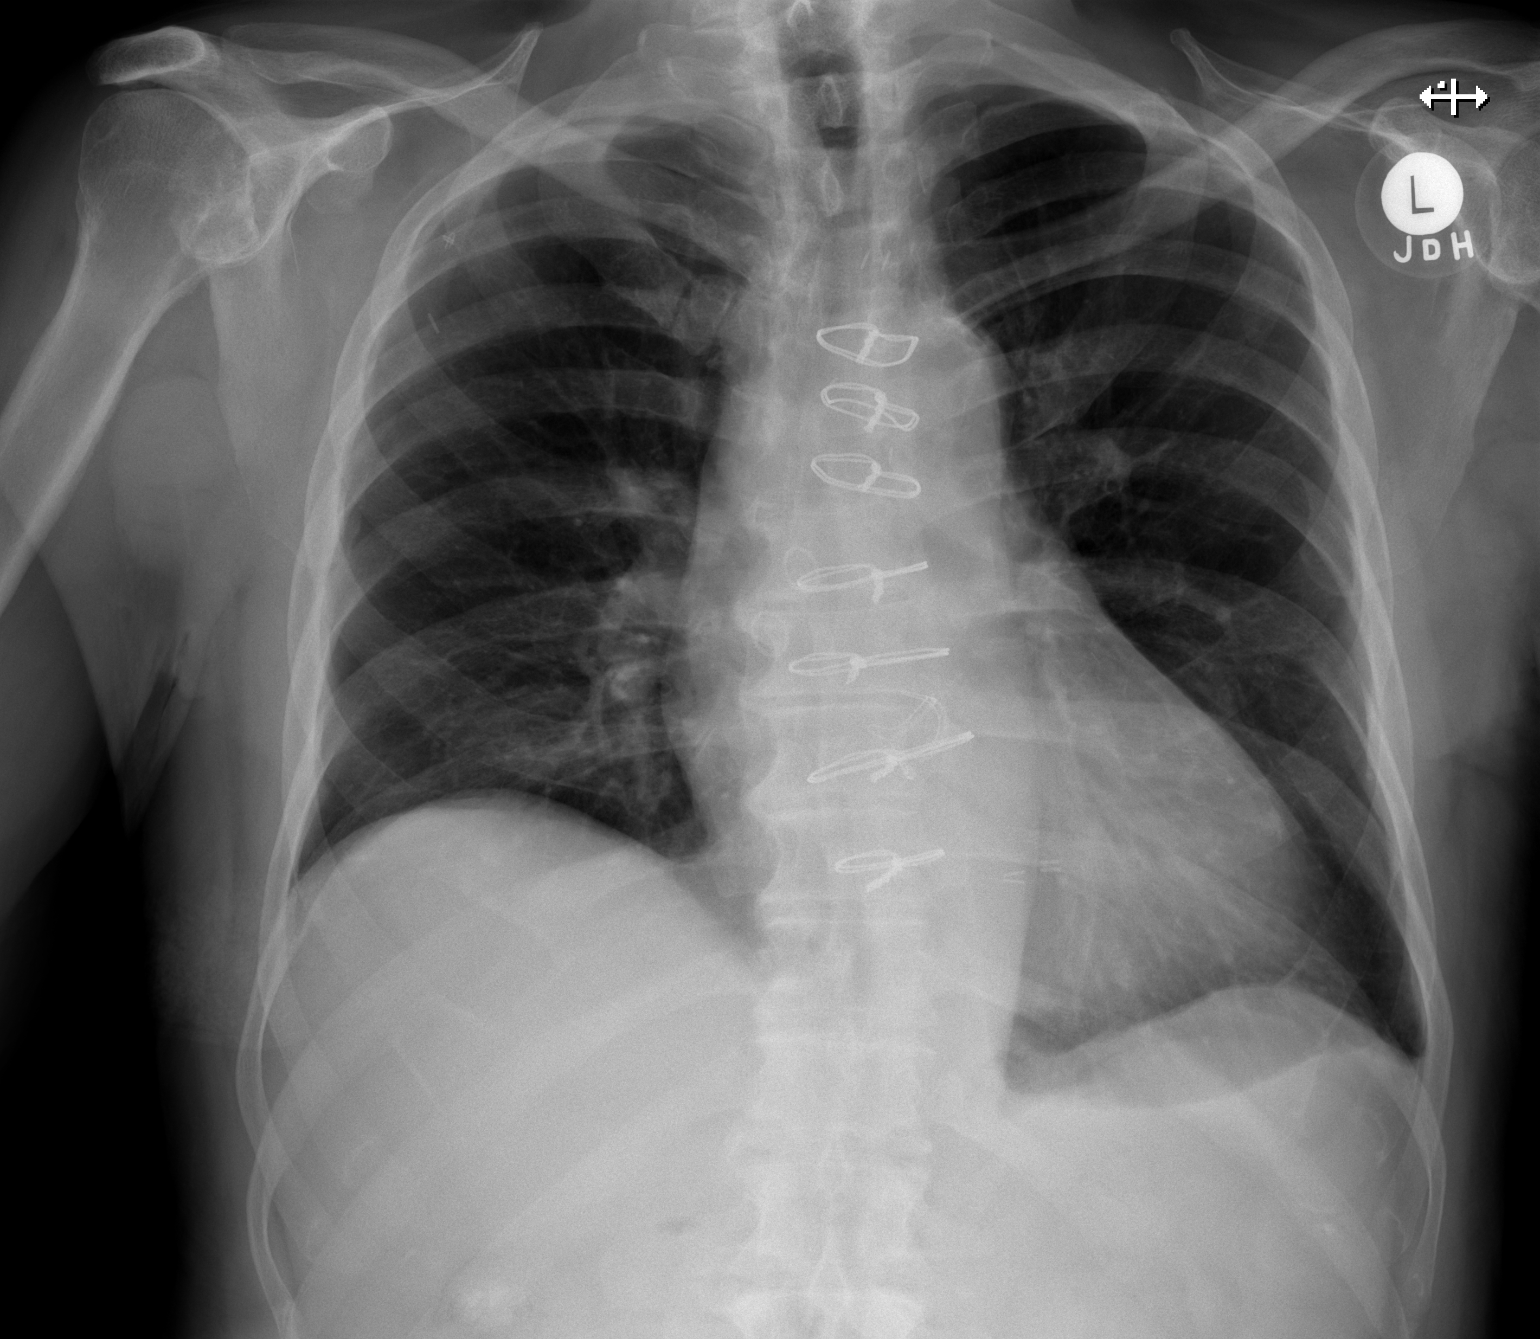

[w chest lat]
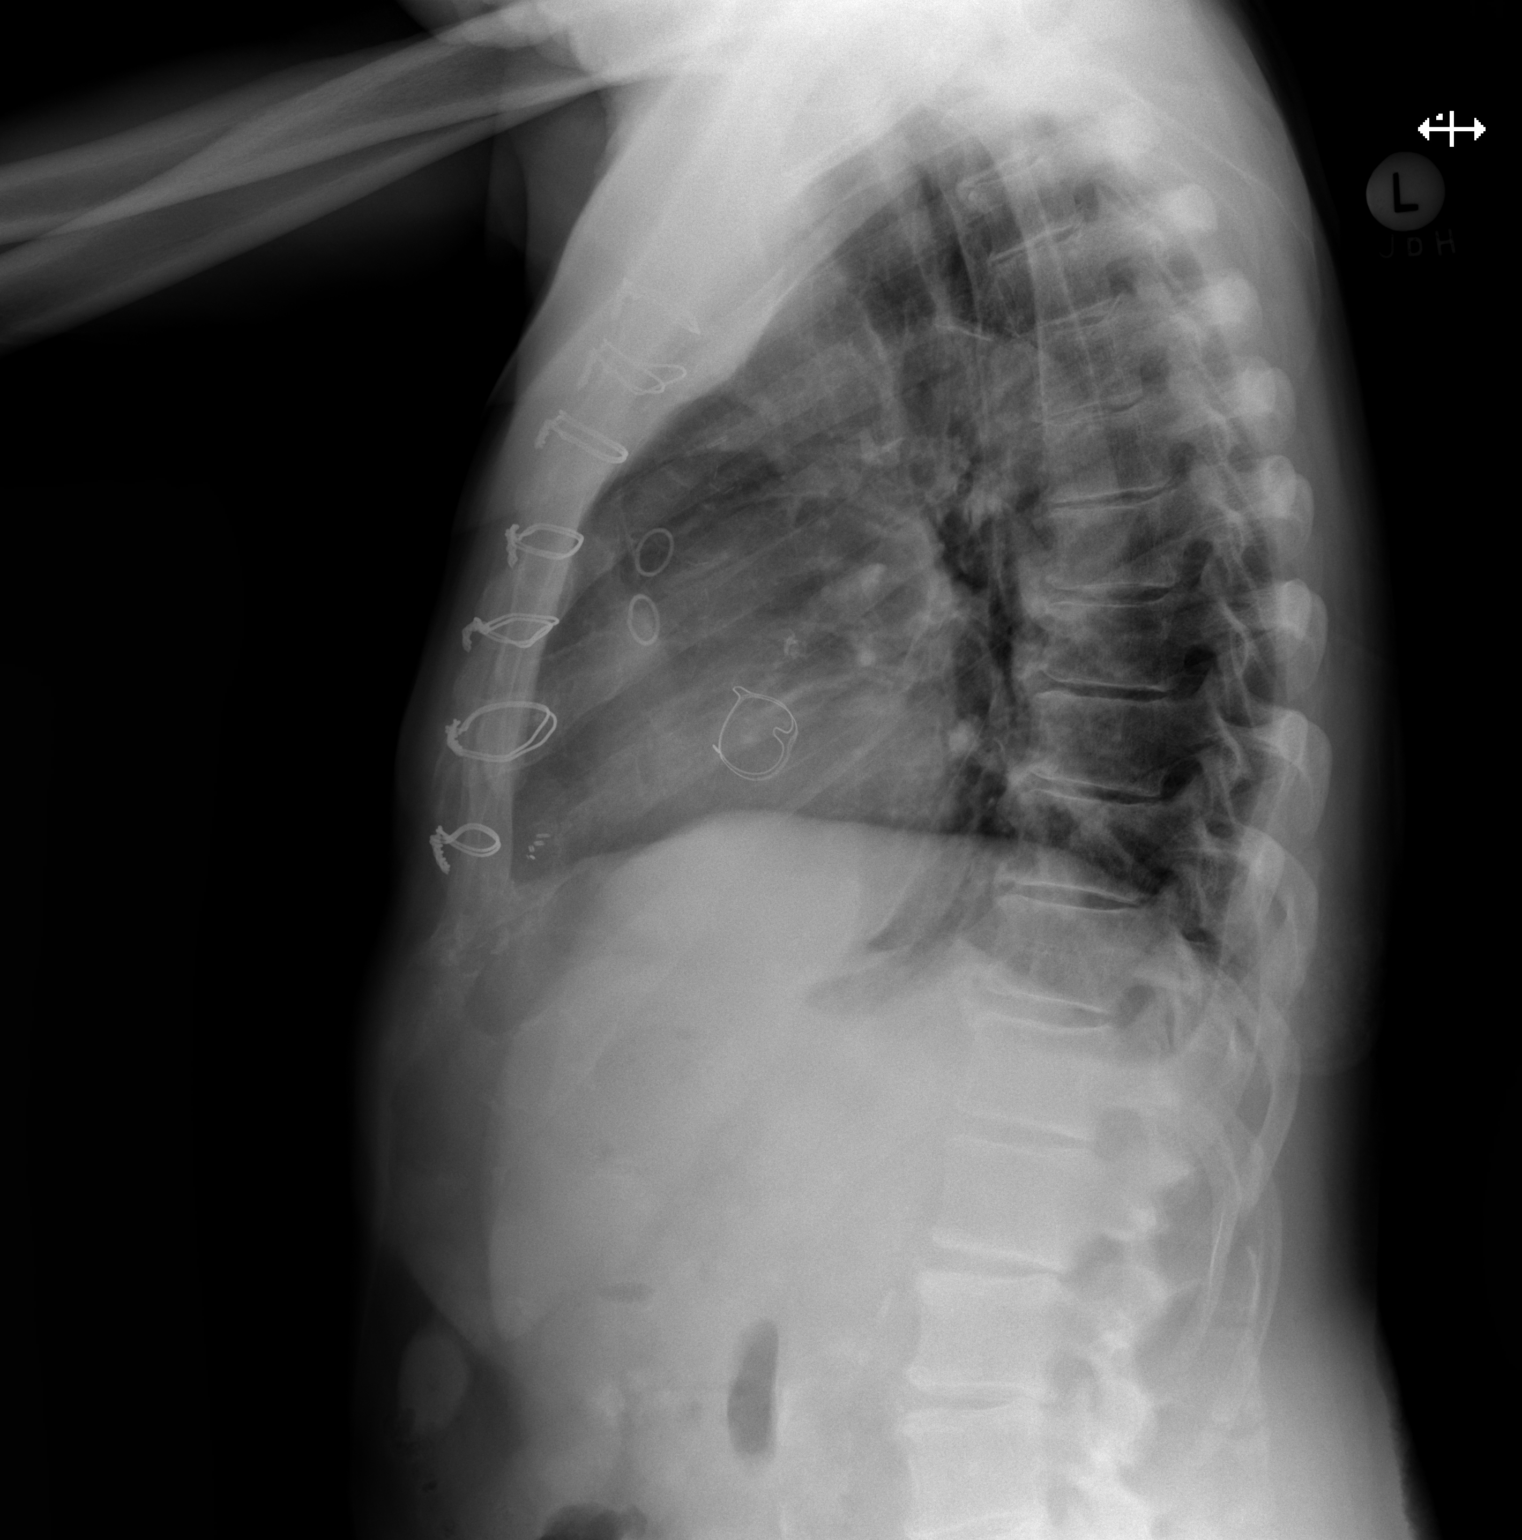

[2 of 2 positions shown; findings below may reference images not displayed]

FINDINGS: Almost complete resolution of the pleural effusions since the prior
study with only minimal residual blunting of the posterior
costophrenic angles.

Heart size and vascularity are normal. Lungs are clear. No
pneumothorax.

Slight elevation of the right hemidiaphragm which is new since
02/20/2018.

Aortic atherosclerosis.  No acute bone abnormality.
IMPRESSION: 1. Tiny residual bilateral pleural effusions.
2. New slight elevation of the right hemidiaphragm.

## 2018-11-20 DIAGNOSIS — R011 Cardiac murmur, unspecified: Secondary | ICD-10-CM | POA: Diagnosis not present

## 2018-11-20 DIAGNOSIS — I10 Essential (primary) hypertension: Secondary | ICD-10-CM | POA: Diagnosis not present

## 2018-11-20 DIAGNOSIS — D649 Anemia, unspecified: Secondary | ICD-10-CM | POA: Diagnosis not present

## 2018-11-20 DIAGNOSIS — R5383 Other fatigue: Secondary | ICD-10-CM | POA: Diagnosis not present

## 2018-11-20 DIAGNOSIS — E538 Deficiency of other specified B group vitamins: Secondary | ICD-10-CM | POA: Diagnosis not present

## 2018-11-20 DIAGNOSIS — E7800 Pure hypercholesterolemia, unspecified: Secondary | ICD-10-CM | POA: Diagnosis not present

## 2018-11-20 DIAGNOSIS — Z1389 Encounter for screening for other disorder: Secondary | ICD-10-CM | POA: Diagnosis not present

## 2018-11-20 DIAGNOSIS — Z7189 Other specified counseling: Secondary | ICD-10-CM | POA: Diagnosis not present

## 2018-11-20 DIAGNOSIS — Z0000 Encounter for general adult medical examination without abnormal findings: Secondary | ICD-10-CM | POA: Diagnosis not present

## 2018-12-09 ENCOUNTER — Telehealth: Payer: Self-pay | Admitting: Cardiology

## 2018-12-09 ENCOUNTER — Emergency Department
Admission: EM | Admit: 2018-12-09 | Discharge: 2018-12-09 | Disposition: A | Discharge status: Home / Self Care | Payer: Medicare HMO | Attending: Student in an Organized Health Care Education/Training Program | Admitting: Student in an Organized Health Care Education/Training Program

## 2018-12-09 ENCOUNTER — Emergency Department: Payer: Medicare HMO

## 2018-12-09 ENCOUNTER — Other Ambulatory Visit: Payer: Self-pay

## 2018-12-09 DIAGNOSIS — Z951 Presence of aortocoronary bypass graft: Secondary | ICD-10-CM | POA: Insufficient documentation

## 2018-12-09 DIAGNOSIS — I471 Supraventricular tachycardia: Secondary | ICD-10-CM | POA: Insufficient documentation

## 2018-12-09 DIAGNOSIS — R002 Palpitations: Secondary | ICD-10-CM | POA: Diagnosis present

## 2018-12-09 DIAGNOSIS — R55 Syncope and collapse: Secondary | ICD-10-CM | POA: Diagnosis not present

## 2018-12-09 DIAGNOSIS — I2510 Atherosclerotic heart disease of native coronary artery without angina pectoris: Secondary | ICD-10-CM | POA: Insufficient documentation

## 2018-12-09 DIAGNOSIS — I5032 Chronic diastolic (congestive) heart failure: Secondary | ICD-10-CM | POA: Insufficient documentation

## 2018-12-09 DIAGNOSIS — Z7982 Long term (current) use of aspirin: Secondary | ICD-10-CM | POA: Insufficient documentation

## 2018-12-09 DIAGNOSIS — R531 Weakness: Secondary | ICD-10-CM | POA: Diagnosis not present

## 2018-12-09 DIAGNOSIS — N183 Chronic kidney disease, stage 3 (moderate): Secondary | ICD-10-CM | POA: Insufficient documentation

## 2018-12-09 DIAGNOSIS — I130 Hypertensive heart and chronic kidney disease with heart failure and stage 1 through stage 4 chronic kidney disease, or unspecified chronic kidney disease: Secondary | ICD-10-CM | POA: Insufficient documentation

## 2018-12-09 DIAGNOSIS — Z79899 Other long term (current) drug therapy: Secondary | ICD-10-CM | POA: Insufficient documentation

## 2018-12-09 LAB — BASIC METABOLIC PANEL
Anion gap: 10 (ref 5–15)
BUN: 37 mg/dL — ABNORMAL HIGH (ref 8–23)
CO2: 24 mmol/L (ref 22–32)
Calcium: 8.9 mg/dL (ref 8.9–10.3)
Chloride: 104 mmol/L (ref 98–111)
Creatinine, Ser: 2.28 mg/dL — ABNORMAL HIGH (ref 0.61–1.24)
GFR calc Af Amer: 30 mL/min — ABNORMAL LOW (ref >60)
GFR calc non Af Amer: 26 mL/min — ABNORMAL LOW (ref >60)
Glucose, Bld: 129 mg/dL — ABNORMAL HIGH (ref 70–99)
Potassium: 3.8 mmol/L (ref 3.5–5.1)
Sodium: 138 mmol/L (ref 135–145)

## 2018-12-09 LAB — CBC
HCT: 34.6 % — ABNORMAL LOW (ref 39.0–52.0)
Hemoglobin: 11.5 g/dL — ABNORMAL LOW (ref 13.0–17.0)
MCH: 32.1 pg (ref 26.0–34.0)
MCHC: 33.2 g/dL (ref 30.0–36.0)
MCV: 96.6 fL (ref 80.0–100.0)
Platelets: 144 10*3/uL — ABNORMAL LOW (ref 150–400)
RBC: 3.58 MIL/uL — ABNORMAL LOW (ref 4.22–5.81)
RDW: 13.2 % (ref 11.5–15.5)
WBC: 7.9 10*3/uL (ref 4.0–10.5)
nRBC: 0 % (ref 0.0–0.2)

## 2018-12-09 LAB — TSH: TSH: 3.94 u[IU]/mL (ref 0.350–4.500)

## 2018-12-09 LAB — TROPONIN I: Troponin I: <0.03 ng/mL (ref <0.03)

## 2018-12-09 MED ORDER — DILTIAZEM HCL 25 MG/5ML IV SOLN
15.0000 mg | Freq: Once | INTRAVENOUS | Status: AC
  Administered 2018-12-09: 15 mg via INTRAVENOUS
  Filled 2018-12-09: qty 5

## 2018-12-09 MED ORDER — CARVEDILOL 6.25 MG PO TABS
12.5000 mg | ORAL_TABLET | Freq: Once | ORAL | Status: AC
  Administered 2018-12-09: 12.5 mg via ORAL
  Filled 2018-12-09: qty 2

## 2018-12-09 MED ORDER — DILTIAZEM HCL 60 MG PO TABS
60.0000 mg | ORAL_TABLET | Freq: Once | ORAL | Status: AC
  Administered 2018-12-09: 60 mg via ORAL
  Filled 2018-12-09: qty 1

## 2018-12-09 MED ORDER — ADENOSINE 6 MG/2ML IV SOLN
6.0000 mg | Freq: Once | INTRAVENOUS | Status: DC
  Filled 2018-12-09 (×2): qty 2

## 2018-12-09 MED ORDER — CARVEDILOL 6.25 MG PO TABS: 12.5000 mg | ORAL_TABLET | Freq: Two times a day (BID) | ORAL | Status: DC

## 2018-12-09 MED ORDER — DILTIAZEM HCL-DEXTROSE 100-5 MG/100ML-% IV SOLN (PREMIX)
5.0000 mg/h | INTRAVENOUS | Status: DC
  Administered 2018-12-09: 5 mg/h via INTRAVENOUS
  Filled 2018-12-09 (×2): qty 100

## 2018-12-09 MED ORDER — MAGNESIUM SULFATE 2 GM/50ML IV SOLN
2.0000 g | Freq: Once | INTRAVENOUS | Status: AC
  Administered 2018-12-09: 2 g via INTRAVENOUS
  Filled 2018-12-09: qty 50

## 2018-12-09 NOTE — ED Triage Notes (Signed)
Pt arrives to ED with family. Had a near syncopal episode this AM. Had sweats and sat down. States hasn't felt back to baseline since. Family took BP this afternoon. 158/112. HR 151 at home. Aortic valve replacement and triple bypass this past march. Had a fib then, took amnio x 3 months but hasn't taken it since. Family thinks he is back in a fib today.

## 2018-12-09 NOTE — Telephone Encounter (Signed)
Patient's daughter called in to the answering service.  I returned the call.  I talked to the patient as well as his daughter.  The patient had an episode of breaking out in a sweat and was very hot this morning with his clothes getting soaked.  He gradually felt better when out and came back home but still does not feel normal.  He denies any shortness of breath, chest pain or lightheadedness.  His blood pressure is elevated at 159/119.  His pulse ox is showing a heart rate fluctuating between 75 and 150.  The patient has a history of postop A. fib after his bypass surgery.  He had been on amiodarone and blood thinner in the past but not currently.  Unfortunately the office is closed and we cannot handle this in the office so the patient was advised to go to the emergency department for an EKG and possible medication adjustment.  That may be all he needs since he is not feeling too awfully bad.  I spoke with both the patient and his daughter and they agree.

## 2018-12-09 NOTE — ED Provider Notes (Signed)
[facility] Emergency Department Provider Note    First MD Initiated Contact with Patient 12/09/18 1620     (approximate)  I have reviewed the triage vital signs and the nursing notes.   HISTORY  Chief Complaint palpitations   HPI Rodney [facility] is a 83 y.o. male extensive past medical history as listed below presents to the ER for evaluation of palpitations and generalized weakness that started early this morning.  He is not currently on any blood thinners.  Recently status post open aortic valve repair and triple bypass surgery.  Denies any chest pain.  No pleuritic pain.  No nausea or vomiting.  States his been compliant with his medications.  He follows with Dr. end of cardiology.    Past Medical History:  Diagnosis Date  . Aortic insufficiency    a. 02/2018 s/p bioprosthetic AVR; 04/2018 Echo: EF 50-55%, Gr2 DD, Ao bioprosthesis, mean grad 48mmHg, Ao root/Asc Ao nl in size, mild MR, mildly dil LA, nl RV fxn. Nl PASP.  [name] Kitchen Arthropathy   . Ascending aortic aneurysm ([place])    a. 12/2016 MRA: 4.3cm @ sinus of valsalva, 4.9cm above sinotubular jxn; b. 02/2018 s/p biological Bentall and AVR.  [name] Kitchen BPH (benign prostatic hyperplasia)   . 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 02/2018 Cath: LAD 20ost, 40p, 17m, LCX 60p, OM2 60, OM3 85, RCA 10p/m w/ L->R and R->R collats;  b. 02/2018 CABG x 3: LIMA to LAD, SVG to OM2, SVG to PDA, EVH via right thigh.  . CKD (chronic kidney disease), stage III ([place])   . Diverticulitis   . Essential hypertension   . GERD (gastroesophageal reflux disease)   . Hemorrhoids   . History of chicken pox   . History of Helicobacter pylori infection 06/2007  . Hyperlipidemia   . Hypertension   . Left renal artery stenosis (HCC)    a. 01/2017 RA u/s: moderate L RAS.  [name] Kitchen Lower extremity edema   . Nonrheumatic mitral (valve) insufficiency    a. 12/2016 Echo: mild to mod MR; b. 09/2017 TEE: mild to mod MR; c. 04/2018 Echo: Mild MR.  [name] Kitchen Nonrheumatic  pulmonary valve insufficiency   . 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 s/p 21 mm Edwards Inspiris Resilia stented bovine pericardial tissue valve and 24 mm Gelweave Valsalva synthetic root conduit with reimplantation of left main coronary artery.   Family History  Problem Relation Age of Onset  . Hypertension Mother   . Stroke Mother   . Leukemia Father   . Heart attack Paternal Grandmother   . Stroke Paternal Grandfather   . Heart Problems Son 76  . Heart Problems Son 73  . Uterine cancer Sister   . Prostate cancer Brother   . Colon cancer Brother   . Stroke Sister    Past Surgical History:  Procedure Laterality Date  . AORTIC VALVE REPAIR N/A 02/22/2018   Procedure: AORTIC VALVE REPLACEMENT -Biological Bentall aortic root replacement,;  Surgeon: Rexene Alberts, MD;  Location: [place];  Service: Open Heart Surgery;  Laterality: N/A;  . bypass surgery    . CARDIAC CATHETERIZATION     01/16/2018  . COLONOSCOPY  2011   by Dr. Candace Cruise with findings of diverticulosis  . CORONARY ARTERY BYPASS GRAFT N/A 02/22/2018   Procedure: CORONARY ARTERY BYPASS GRAFTING (CABG) x three, using left internal mammary artery and right leg greater saphenous vein harvested endoscopically;  Surgeon: Rexene Alberts, MD;  Location: [place];  Service: Open Heart Surgery;  Laterality: N/A;  . CORONARY/GRAFT ANGIOGRAPHY N/A 01/16/2018   Procedure: CORONARY/GRAFT ANGIOGRAPHY;  Surgeon: Nelva Bush, MD;  Location: [place] CV LAB;  Service: Cardiovascular;  Laterality: N/A;  . ELBOW SURGERY Left   . hemorhoidectomy    . RIGHT HEART CATH N/A 01/16/2018   Procedure: RIGHT HEART CATH;  Surgeon: Nelva Bush, MD;  Location: [place] CV LAB;  Service: Cardiovascular;  Laterality: N/A;  . SHOULDER ARTHROSCOPY WITH ROTATOR CUFF REPAIR Right   . SHOULDER SURGERY Right   . TEE WITHOUT CARDIOVERSION N/A 09/27/2017   Procedure:  TRANSESOPHAGEAL ECHOCARDIOGRAM (TEE);  Surgeon: Dorothy Spark, MD;  Location: [facility] ENDOSCOPY;  Service: Cardiovascular;  Laterality: N/A;  . TEE WITHOUT CARDIOVERSION N/A 02/22/2018   Procedure: TRANSESOPHAGEAL ECHOCARDIOGRAM (TEE);  Surgeon: Rexene Alberts, MD;  Location: [place];  Service: Open Heart Surgery;  Laterality: N/A;  . THORACIC AORTIC ANEURYSM REPAIR N/A 02/22/2018   Procedure: THORACIC ASCENDING ANEURYSM REPAIR (AAA) Resection of ascending aorta aneurysm;  Surgeon: Rexene Alberts, MD;  Location: [facility] OR;  Service: Open Heart Surgery;  Laterality: N/A;   Patient Active Problem List   Diagnosis Date Noted  . Paroxysmal atrial fibrillation ([place]) 05/17/2018  . Chronic diastolic heart failure ([place]) 05/17/2018  . Medication management 05/17/2018  . History of aortic valve replacement with bioprosthetic valve 02/22/2018  . S/P CABG x 3 02/22/2018  . S/P ascending aortic aneurysm repair 02/22/2018  . Coronary artery disease involving native coronary artery of native heart without angina pectoris   . 1st degree AV block 12/21/2017  . Stage 3 chronic kidney disease ([place]) 03/22/2017  . Thoracic aortic aneurysm without rupture ([place]) 01/27/2017  . Renal artery stenosis ([place]) 01/27/2017  . Hyperlipidemia LDL goal <70 01/27/2017  . Nonrheumatic aortic valve insufficiency 10/27/2016  . Mitral regurgitation 10/27/2016  . Essential hypertension 02/08/2007  . HYPERTENSION, BENIGN SYSTEMIC 02/02/2007      Prior to Admission medications   Medication Sig Start Date End Date Taking? Authorizing Provider  amLODipine (NORVASC) 10 MG tablet TAKE ONE TABLET BY MOUTH DAILY 10/17/18  Yes End, Harrell Gave, MD  aspirin EC 81 MG tablet Take 81 mg by mouth daily.   Yes [provider]  atorvastatin (LIPITOR) 20 MG tablet Take 1 tablet (20 mg total) by mouth daily. 06/09/18  Yes End, Harrell Gave, MD  carvedilol (COREG) 6.25 MG tablet Take 1 tablet (6.25 mg total) by mouth 2 (two) times daily.  06/21/18  Yes Theora Gianotti, NP  doxazosin (CARDURA) 2 MG tablet Take 1 tablet (2 mg total) by mouth daily. 09/26/18  Yes End, Harrell Gave, MD  ferrous sulfate 325 (65 FE) MG EC tablet Take 325 mg by mouth 2 (two) times daily.    Yes [provider]  omeprazole (PRILOSEC) 20 MG capsule Take 20 mg by mouth daily.   Yes [provider]  sennosides-docusate sodium (SENOKOT-S) 8.6-50 MG tablet Take 1 tablet by mouth 2 (two) times daily.    Yes [provider]  torsemide (DEMADEX) 20 MG tablet Take 1 tablet (20 mg total) by mouth daily as needed. 06/01/18 12/09/18 Yes End, Harrell Gave, MD  vitamin C (ASCORBIC ACID) 500 MG tablet Take 500 mg by mouth every evening.    Yes [provider]    Allergies Patient has no known allergies.    Social History Social History   Tobacco Use  . Smoking status: Never Smoker  .  Smokeless tobacco: Never Used  Substance Use Topics  . Alcohol use: No  . Drug use: No    Review of Systems Patient denies headaches, rhinorrhea, blurry vision, numbness, shortness of breath, chest pain, edema, cough, abdominal pain, nausea, vomiting, diarrhea, dysuria, fevers, rashes or hallucinations unless otherwise stated above in HPI. ____________________________________________   PHYSICAL EXAM:  VITAL SIGNS: Vitals:   12/09/18 1800 12/09/18 1830  BP: (!) 146/87 (!) 118/96  Pulse: 89 90  Resp: 14 20  Temp:    SpO2: 100% 98%    Constitutional: Alert and oriented.  Eyes: Conjunctivae are normal.  Head: Atraumatic. Nose: No congestion/rhinnorhea. Mouth/Throat: Mucous membranes are moist.   Neck: No stridor. Painless ROM.  Cardiovascular: tachycardic regular rhythm. Grossly normal heart sounds.  Good peripheral circulation. Respiratory: Normal respiratory effort.  No retractions. Lungs CTAB. Gastrointestinal: Soft and nontender. No distention. No abdominal bruits. No CVA tenderness. Genitourinary:  Musculoskeletal: No  lower extremity tenderness nor edema.  No joint effusions. Neurologic:  Normal speech and language. No gross focal neurologic deficits are appreciated. No facial droop Skin:  Skin is warm, dry and intact. No rash noted. Psychiatric: Mood and affect are normal. Speech and behavior are normal.  ____________________________________________   LABS (all labs ordered are listed, but only abnormal results are displayed)  Results for orders placed or performed during the hospital encounter of 12/09/18 (from the past 24 hour(s))  Basic metabolic panel     Status: Abnormal   Collection Time: 12/09/18  4:12 PM  Result Value Ref Range   Sodium 138 135 - 145 mmol/L   Potassium 3.8 3.5 - 5.1 mmol/L   Chloride 104 98 - 111 mmol/L   CO2 24 22 - 32 mmol/L   Glucose, Bld 129 (H) 70 - 99 mg/dL   BUN 37 (H) 8 - 23 mg/dL   Creatinine, Ser 2.28 (H) 0.61 - 1.24 mg/dL   Calcium 8.9 8.9 - 10.3 mg/dL   GFR calc non Af Amer 26 (L) >60 mL/min   GFR calc Af Amer 30 (L) >60 mL/min   Anion gap 10 5 - 15  CBC     Status: Abnormal   Collection Time: 12/09/18  4:12 PM  Result Value Ref Range   WBC 7.9 4.0 - 10.5 K/uL   RBC 3.58 (L) 4.22 - 5.81 MIL/uL   Hemoglobin 11.5 (L) 13.0 - 17.0 g/dL   HCT 34.6 (L) 39.0 - 52.0 %   MCV 96.6 80.0 - 100.0 fL   MCH 32.1 26.0 - 34.0 pg   MCHC 33.2 30.0 - 36.0 g/dL   RDW 13.2 11.5 - 15.5 %   Platelets 144 (L) 150 - 400 K/uL   nRBC 0.0 0.0 - 0.2 %  Troponin I - ONCE - STAT     Status: None   Collection Time: 12/09/18  4:12 PM  Result Value Ref Range   Troponin I <0.03 <0.03 ng/mL  TSH     Status: None   Collection Time: 12/09/18  4:12 PM  Result Value Ref Range   TSH 3.940 0.350 - 4.500 uIU/mL   ____________________________________________  EKG My review and personal interpretation at Time: 16:11   Indication: palpitations  Rate: 160  Rhythm: svt Axis: left Other: nonspecific st abn likely rate dependent ____________________________________________  RADIOLOGY  I  personally reviewed all radiographic images ordered to evaluate for the above acute complaints and reviewed radiology reports and findings.  These findings were personally discussed with the patient.  Please see medical record  for radiology report.  ____________________________________________   PROCEDURES  Procedure(s) performed:  .Critical Care Performed by: Merlyn Lot, MD Authorized by: Merlyn Lot, MD   Critical care provider statement:    Critical care time (minutes):  30   Critical care time was exclusive of:  Separately billable procedures and treating other patients   Critical care was necessary to treat or prevent imminent or life-threatening deterioration of the following conditions:  Cardiac failure   Critical care was time spent personally by me on the following activities:  Development of treatment plan with patient or surrogate, discussions with consultants, evaluation of patient's response to treatment, examination of patient, obtaining history from patient or surrogate, ordering and performing treatments and interventions, ordering and review of laboratory studies, ordering and review of radiographic studies, pulse oximetry, re-evaluation of patient's condition and review of old charts      Critical Care performed: yes ____________________________________________   INITIAL IMPRESSION / [place] / ED COURSE  Pertinent labs & imaging results that were available during my care of the patient were reviewed by me and considered in my medical decision making (see chart for details).   DDX: svt, electrolyte abn, dysrhythmia, dehydration  JERID CATHERMAN is a 83 y.o. who presents to the ED with symptoms as described above.  Patient with evidence of probable SVT by EKG.  I tried a Valsalva maneuver and was able to get the patient to break temporarily to a sinus rhythm but within 30 seconds he reverted to SVT with heart rate in the 150s to 160s.   Is not  currently anticoagulated.  Uncertain if he is having paroxysmal A. fib therefore will give Cardizem and reassess.  Clinical Course as of Dec 10 2003  Sat Dec 09, 2018  1704 Patient now appears to be in sinus rhythm.  Hemodynamically stable.  Will give oral loading dose of Cardizem continue to monitor patient.   [PR]  1717 Blood work is roughly at baseline.  Troponin negative.  Remains in sinus rhythm.  We will continue to monitor.   [PR]  1726 Repeat EKG does show sinus rhythm.  Will consult cardiology given his extensive cardiac recent history for further medical ration recommendations.   [PR]  1815 Consulted with Dr. Lovena Le of cardiology who agrees with current management.  I will continue observe patient here in the ER.  Do not feel patient requires anticoagulation at this time as a seem to be primarily SVT with no prolonged evidence of A. fib.   [PR]  2002 Patient reassessed.  Has remained in sinus rhythm.  Well-appearing and nontoxic.  At this point do believe he stable and appropriate for outpatient follow-up.   [PR]    Clinical Course User Index [PR] Merlyn Lot, MD     As part of my medical decision making, I reviewed the following data within the [facility] notes reviewed and incorporated, Labs reviewed, notes from prior ED visits and  Controlled Substance Database   ____________________________________________   FINAL CLINICAL IMPRESSION(S) / ED DIAGNOSES  Final diagnoses:  SVT (supraventricular tachycardia) ([place])      NEW MEDICATIONS STARTED DURING THIS VISIT:  New Prescriptions   No medications on file     Note:  This document was prepared using Dragon voice recognition software and may include unintentional dictation errors.    Merlyn Lot, MD 12/09/18 2005

## 2018-12-09 NOTE — ED Notes (Signed)
Pt arrives to ED 18 with HR of 152 with no pain. Pt calm and NAD.

## 2018-12-09 NOTE — Discharge Instructions (Addendum)
Please take 12.5 mg of carvedilol twice daily but stop taking your amlodipine as this may drop your blood pressure too much.  Follow-up with Dr. Saunders Revel as soon as possible.  Return to the ER if you have any recurrence of any symptoms or for any additional questions or concerns.

## 2018-12-11 ENCOUNTER — Ambulatory Visit: Payer: Medicare HMO | Admitting: Cardiovascular Disease

## 2018-12-11 ENCOUNTER — Encounter: Payer: Self-pay | Admitting: Cardiovascular Disease

## 2018-12-11 VITALS — BP 124/80 | HR 70 | Ht 67.0 in | Wt 169.5 lb

## 2018-12-11 DIAGNOSIS — I10 Essential (primary) hypertension: Secondary | ICD-10-CM

## 2018-12-11 DIAGNOSIS — I701 Atherosclerosis of renal artery: Secondary | ICD-10-CM | POA: Diagnosis not present

## 2018-12-11 DIAGNOSIS — I5032 Chronic diastolic (congestive) heart failure: Secondary | ICD-10-CM | POA: Insufficient documentation

## 2018-12-11 DIAGNOSIS — I351 Nonrheumatic aortic (valve) insufficiency: Secondary | ICD-10-CM

## 2018-12-11 DIAGNOSIS — I471 Supraventricular tachycardia: Secondary | ICD-10-CM

## 2018-12-11 DIAGNOSIS — I2510 Atherosclerotic heart disease of native coronary artery without angina pectoris: Secondary | ICD-10-CM

## 2018-12-11 MED ORDER — DILTIAZEM HCL ER COATED BEADS 120 MG PO CP24: 120.0000 mg | ORAL_CAPSULE | Freq: Every day | ORAL | 3 refills | Status: DC

## 2018-12-11 MED ORDER — CARVEDILOL 6.25 MG PO TABS: 6.2500 mg | ORAL_TABLET | Freq: Two times a day (BID) | ORAL | Status: DC

## 2018-12-11 NOTE — Progress Notes (Signed)
Cardiology Office Note  Date:  12/11/2018   ID:  Rodney [facility], DOB 1936/10/05, MRN [id number]  PCP:  Remi Haggard, FNP   Chief Complaint  Patient presents with  . other    Follow up from [facility] ER; A-fib. Pt. c/o shortness of breath and irreg. heart beats yesterday.     HPI:  [name] is a 83 y.o. year-old male with history of  hypertension, coronary artery disease status post CABG (3/19)aortic regurgitationandthoracic aortic aneurysmstatus post Bentall with bioprosthetic aortic valve replacement (3/19),  HFpEF,  chronic kidney disease stage 3with left renal artery stenosis,  status post open aortic valve repair and triple bypass surgery who presents for follow-up of coronary artery disease, HFpEF, valvular heart disease, and renal artery stenosis.   Saturday was eating breakfast,  Acutely felt hot, sweaty, Sat on couch for 30 min Went to the ER Noted to have narrow complex tachycardia rate 160 bpm Given diltiazem, SVT broke to NSR   SVT/tachy again last night Did not go to the ER, was tachy for hours until 3 Am Finally went away without intervention  Presents today with his daughter, they would like different medication changes He denies any chest pain at baseline, no shortness of breath, PND He does not use the torsemide on a regular basis Rarely has ankle swelling  Creatinine 2.4, close to his baseline  EKG personally reviewed by myself on todays visit Shows normal sinus rhythm with rate 70 bpm poor R wave progression to the anterior precordial leads, PVCs, left axis deviation  EKG from the emergency room personally reviewed by myself showing narrow complex tachycardia SVT rate 160 bpm       PMH:   has a past medical history of Aortic insufficiency, Arthropathy, Ascending aortic aneurysm ([place]), BPH (benign prostatic hyperplasia), CAD S/P CABG x 3 (02/22/2018), CKD (chronic kidney disease), stage III ([place]), Diverticulitis, Essential hypertension, GERD  (gastroesophageal reflux disease), Hemorrhoids, History of chicken pox, History of Helicobacter pylori infection (06/2007), Hyperlipidemia, Hypertension, Left renal artery stenosis ([place]), Lower extremity edema, Nonrheumatic mitral (valve) insufficiency, Nonrheumatic pulmonary valve insufficiency, and 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  PSH:    Past Surgical History:  Procedure Laterality Date  . AORTIC VALVE REPAIR N/A 02/22/2018   Procedure: AORTIC VALVE REPLACEMENT -Biological Bentall aortic root replacement,;  Surgeon: Rexene Alberts, MD;  Location: [place];  Service: Open Heart Surgery;  Laterality: N/A;  . bypass surgery    . CARDIAC CATHETERIZATION     01/16/2018  . COLONOSCOPY  2011   by Dr. Candace Cruise with findings of diverticulosis  . CORONARY ARTERY BYPASS GRAFT N/A 02/22/2018   Procedure: CORONARY ARTERY BYPASS GRAFTING (CABG) x three, using left internal mammary artery and right leg greater saphenous vein harvested endoscopically;  Surgeon: Rexene Alberts, MD;  Location: [place];  Service: Open Heart Surgery;  Laterality: N/A;  . CORONARY/GRAFT ANGIOGRAPHY N/A 01/16/2018   Procedure: CORONARY/GRAFT ANGIOGRAPHY;  Surgeon: Nelva Bush, MD;  Location: [place] CV LAB;  Service: Cardiovascular;  Laterality: N/A;  . ELBOW SURGERY Left   . hemorhoidectomy    . RIGHT HEART CATH N/A 01/16/2018   Procedure: RIGHT HEART CATH;  Surgeon: Nelva Bush, MD;  Location: [place] CV LAB;  Service: Cardiovascular;  Laterality: N/A;  . SHOULDER ARTHROSCOPY WITH ROTATOR CUFF REPAIR Right   . SHOULDER SURGERY Right   . TEE WITHOUT CARDIOVERSION N/A 09/27/2017   Procedure: TRANSESOPHAGEAL ECHOCARDIOGRAM (TEE);  Surgeon: Dorothy Spark, MD;  Location: [facility] ENDOSCOPY;  Service: Cardiovascular;  Laterality: N/A;  . TEE WITHOUT CARDIOVERSION N/A 02/22/2018   Procedure: TRANSESOPHAGEAL ECHOCARDIOGRAM (TEE);  Surgeon: Rexene Alberts, MD;  Location: [place];  Service: Open Heart Surgery;  Laterality: N/A;  . THORACIC AORTIC ANEURYSM REPAIR N/A 02/22/2018   Procedure: THORACIC ASCENDING ANEURYSM REPAIR (AAA) Resection of ascending aorta aneurysm;  Surgeon: Rexene Alberts, MD;  Location: [facility];  Service: Open Heart Surgery;  Laterality: N/A;    Current Outpatient Medications  Medication Sig Dispense Refill  . aspirin EC 81 MG tablet Take 81 mg by mouth daily.    [name] Kitchen atorvastatin (LIPITOR) 20 MG tablet Take 1 tablet (20 mg total) by mouth daily. 90 tablet 3  . carvedilol (COREG) 6.25 MG tablet Take 1 tablet (6.25 mg total) by mouth 2 (two) times daily.    [name] Kitchen doxazosin (CARDURA) 2 MG tablet Take 1 tablet (2 mg total) by mouth daily. 90 tablet 2  . ferrous sulfate 325 (65 FE) MG EC tablet Take 325 mg by mouth 2 (two) times daily.     [name] Kitchen omeprazole (PRILOSEC) 20 MG capsule Take 20 mg by mouth daily.    . sennosides-docusate sodium (SENOKOT-S) 8.6-50 MG tablet Take 1 tablet by mouth 2 (two) times daily.     [name] Kitchen torsemide (DEMADEX) 20 MG tablet Take 1 tablet (20 mg total) by mouth daily as needed. 180 tablet 3  . vitamin C (ASCORBIC ACID) 500 MG tablet Take 500 mg by mouth every evening.     . diltiazem (CARDIZEM CD) 120 MG 24 hr capsule Take 1 capsule (120 mg total) by mouth daily. 30 capsule 3   No current facility-administered medications for this visit.      Allergies:   Patient has no known allergies.   Social History:  The patient  reports that he has never smoked. He has never used smokeless tobacco. He reports that he does not drink alcohol or use drugs.   Family History:   family history includes Colon cancer in his brother; Heart Problems (age of onset: 26) in his son and son; Heart attack in his paternal grandmother; Hypertension in his mother; Leukemia in his father; Prostate cancer in his brother; Stroke in his mother, paternal grandfather, and sister; Uterine cancer in his sister.    Review of  Systems: Review of Systems  Constitutional: Negative.   Respiratory: Negative.   Cardiovascular: Positive for palpitations.       Tachycardia  Gastrointestinal: Negative.   Musculoskeletal: Negative.   Neurological: Negative.   Psychiatric/Behavioral: Negative.   All other systems reviewed and are negative.    PHYSICAL EXAM: VS:  BP 124/80 (BP Location: Right Arm, Patient Position: Sitting, Cuff Size: Normal)   Pulse 70   Ht 5\' 7"  (1.702 m)   Wt 169 lb 8 oz (76.9 kg)   BMI 26.55 kg/m  , BMI Body mass index is 26.55 kg/m. GEN: Well nourished, well developed, in no acute distress  HEENT: normal  Neck: no JVD, carotid bruits, or masses Cardiac: RRR; no murmurs, rubs, or gallops,no edema  Respiratory:  clear to auscultation bilaterally, normal work of breathing GI: soft, nontender, nondistended, + BS MS: no deformity or atrophy  Skin: warm and dry, no rash Neuro:  Strength and sensation are intact Psych: euthymic mood, full affect   Recent Labs: 09/15/2018: ALT 14; Magnesium 2.5 12/09/2018: BUN 37; Creatinine, Ser 2.28; Hemoglobin 11.5; Platelets 144; Potassium 3.8; Sodium 138; TSH  3.940    Lipid Panel Lab Results  Component Value Date   CHOL 132 09/15/2018   HDL 33 (L) 09/15/2018   LDLCALC 56 09/15/2018   TRIG 214 (H) 09/15/2018      Wt Readings from Last 3 Encounters:  12/11/18 169 lb 8 oz (76.9 kg)  12/09/18 172 lb (78 kg)  10/06/18 179 lb 11.2 oz (81.5 kg)       ASSESSMENT AND PLAN:  SVT (supraventricular tachycardia) ([place]) - Plan: EKG 12-Lead New finding per the patient and per review of records Etiology of presentation in the past several days is unclear, 2 episodes very symptomatic He would like medication changes, recommend he decrease carvedilol back to 6.25 twice daily Amlodipine has been held We will start diltiazem extended release 120 mg daily He will monitor blood pressure and heart rate and call our office for any more episodes of tachycardia  concerning for SVT Discussed carotid sinus massage, Valsalva If symptoms persist could try antiarrhythmics versus ablation  Coronary artery disease involving native coronary artery of native heart without angina pectoris Currently with no symptoms of angina. No further workup at this time. Continue current medication regimen.  Nonrheumatic aortic valve insufficiency Valve surgery, Prior echocardiogram May 2019 well-functioning bioprosthetic valve  Chronic heart failure with preserved ejection fraction (HCC) Appears relatively euvolemic if not prerenal He does not take torsemide on a regular basis, very sparingly  Renal artery stenosis (HCC) Followed with periodic Dopplers  Essential hypertension Medication changes as above  Disposition:   F/U with Dr. Saunders Revel in 1 month   Total encounter time more than 25 minutes  Greater than 50% was spent in counseling and coordination of care with the patient    Orders Placed This Encounter  Procedures  . EKG 12-Lead     Signed, Esmond Plants, M.D., Ph.D. 12/11/2018  [place], [place]

## 2018-12-11 NOTE — Patient Instructions (Addendum)
Medication Instructions:   Please stay on coreg 6.25 mg twice a day Continue to hold the amlodipine  Start diltiazem ER 120 mg daily take before dinner   If you need a refill on your cardiac medications before your next appointment, please call your pharmacy.    Lab work: No new labs needed   If you have labs (blood work) drawn today and your tests are completely normal, you will receive your results only by: [name] Kitchen MyChart Message (if you have MyChart) OR . A paper copy in the mail If you have any lab test that is abnormal or we need to change your treatment, we will call you to review the results.   Testing/Procedures: No new testing needed   Follow-Up: At [facility], you and your health needs are our priority.  As part of our continuing mission to provide you with exceptional heart care, we have created designated Provider Care Teams.  These Care Teams include your primary Cardiologist (physician) and Advanced Practice Providers (APPs -  Physician Assistants and Nurse Practitioners) who all work together to provide you with the care you need, when you need it.  . You will need a follow up appointment with Dr. Saunders Revel in one month  .   Please call our office 2 months in advance to schedule this appointment.    . Providers on your designated Care Team:   . Murray Hodgkins, NP . Christell Faith, PA-C . Marrianne Mood, PA-C  Any Other Special Instructions Will Be Listed Below (If Applicable).  For educational health videos Log in to : [email or web address] Or : [email or web address], password : [email or web address]

## 2018-12-11 NOTE — Telephone Encounter (Signed)
The patient was seen by Dr. Rockey Situ today. Dr. Rockey Situ did decrease the patient's coreg to 6.25 mg BID and start him on diltiazem cd 120 mg at night.  The patient already had a follow up appointment on 12/18/18 with Dr. Saunders Revel. I advised the patient to keep this appointment just in case he has more break through of his SVT with the medication changes made today. He is aware, if the changes seem to be working for him, he can call and cancel the appointment with Dr. Saunders Revel on Monday and be seen in 1 month.  The patient also has an appointment with Christell Faith, PA on 01/11/19.  Th patient and his family member were agreeable with the above plan.

## 2018-12-11 NOTE — Telephone Encounter (Signed)
It looks like Rodney Wiley was seen in the ED over the weekend with SVT.  Is there anyway we can get him in to see me or an APP this week?  If not, he can keep his appointment with me next week.  Thanks.  Gerald Stabs

## 2018-12-11 NOTE — Telephone Encounter (Signed)
Do you mind calling patient and offering him to see APP or Dr End (if appt available) this week?  Thanks!

## 2018-12-11 NOTE — Telephone Encounter (Signed)
Patient seen by Dr. Rockey Situ today.  Please advise

## 2018-12-18 ENCOUNTER — Ambulatory Visit: Payer: Medicare HMO | Admitting: Internal Medicine

## 2018-12-18 NOTE — Progress Notes (Deleted)
Follow-up Outpatient Visit Date: 12/18/2018  Primary Care Provider: Remi Haggard, [place] [address]  Chief Complaint: ***  HPI:  Rodney Wiley is a 83 y.o. year-old male with history of PSVT, hypertension,coronary artery disease status post CABG (3/19)aortic regurgitationandthoracic aortic aneurysmstatus post Bentall with bioprosthetic aortic valve replacement (3/19), HFpEF, and chronic kidney disease stage 3with left renal artery stenosis, who presents for follow-up of PSVT and CAD.  I last saw Rodney Wiley in October, at which time he was doing well.  He presented to the [facility] emergency department earlier this month with palpitations and fatigue.  He was noted to be in SVT and converted back to sinus rhythm after receiving diltiazem.  He followed up with Dr. Rockey Situ a week ago and noted further episodes of palpitations after his ED visit.  He therefore was started on diltiazem 120 mg daily in lieu of amlodipine.  Carvedilol 6.25 mg twice daily was continued.  --------------------------------------------------------------------------------------------------  Cardiovascular History & Procedures: Cardiovascular Problems:  Thoracic aortic aneurysm s/p Bentall  Aortic regurgitation s/p bioprosthetic AVR  Mitral regurgitation  Renal artery stenosis  Coronary artery disease s/p CABG  Paroxysmal supraventricular tachycardia  Risk Factors:  Known coronary artery disease, PAD, hypertension, hyperlipidemia, male gender, and age > 62  Cath/PCI:  Coronary angiography/RHC (01/16/2018): LMCA normal. LAD with sequential 20% ostial, 40% proximal, and long 70% proximal to mid stenoses. LCx with 60% proximal stenosis and 60 to 80-90% OM2 and OM3 lesions. Chronic total occlusion of the proximal/mid RCA with left-to-right collaterals. RA 9, RV 43/10, PA 37/15 (22), and PCWP 18. Fick CO/CI 5.1/2.6.  CV Surgery:  CABG and Bentall with bioprosthetic AVR  (02/22/2018): LIMA to LAD, SVG to OM2, and SVG to PDA. 24 mm synthetic aortic root graft and 21 mm Edwards InspirisRoselia stented bovine pericardial tissue valve.  EP Procedures and Devices:  None  Non-Invasive Evaluation(s):  TTE (04/07/18): Normal LV size with mild LVH. LVEF 50 to 55% with anteroseptal and septal hypokinesis. Grade 2 diastolic dysfunction. Bioprosthetic aortic valve present with mean gradient of 17 mmHg. Mild mitral regurgitation. Normal RV size and function. No pericardial effusion.  TEE (09/27/17): Mildly dilated LV with LVEF 60-65%. Moderate aortic regurgitation. Mild to moderate mitral regurgitation. Mild right atrial enlargement. Moderate aortic dilation, with root measuring 5.0 cm and ascending aorta 4.7 cm  MRA chest (06/06/17): Stable ascending aortic aneurysm, measuring 5.0 cm atthe sinotubular junction and 4.8 cm at the level of the main pulmonary artery.  Renal artery Doppler (01/13/17): Aortic atherosclerosis without stenosis or aneurysm. Normal right renal artery. Mild to moderate left renal artery stenosis (1-59%). Normal/symmetric kidney size.  MRA chest (12/27/16): Aneurysmal disease of the ascending aorta, measuring 4.3 cm at the sinus of Valsalva and 4.9 cm above the sinotubular junction.  TTE (12/14/16): Mildly dilated LV with mild LVH. Normal contraction with LVEF 55-60%. Grade 1 diastolic dysfunction. Mild to moderate aortic regurgitation. Dilated aorta, measuring up to 4.9 cm above the sinotubular junction. Mild to moderate mitral regurgitation. Mild left atrial enlargement. Normal RV size and function.  Transthoracic echo (09/2016, [facility] [facility]): Full report not available. Reportedly showed LVH with LVEF 30% and diastolic dysfunction. MR and moderate to severe AI present with sclerotic aortic valve.  Carotid Doppler (10/201&): Full report not available. Reportedly showed 1-39% stenosis bilaterally.  Transthoracic echo (02/18/16,  [facility]): Normal LV and RV size and function. LVEF >55%. Moderate AI; mild MR and TR.  Recent CV Pertinent Labs: Lab Results  Component Value Date   CHOL 132 09/15/2018   HDL 33 (L) 09/15/2018   LDLCALC 56 09/15/2018   TRIG 214 (H) 09/15/2018   CHOLHDL 4.0 09/15/2018   CHOLHDL 3.8 03/11/2017   INR 2.9 05/24/2018   INR 7.53 (HH) 03/27/2018   K 3.8 12/09/2018   MG 2.5 (H) 09/15/2018   BUN 37 (H) 12/09/2018   BUN 33 (H) 09/15/2018   CREATININE 2.28 (H) 12/09/2018    Past medical and surgical history were reviewed and updated in EPIC.  No outpatient medications have been marked as taking for the 12/18/18 encounter (Appointment) with Anum Palecek, Harrell Gave, MD.    Allergies: Patient has no known allergies.  Social History   Tobacco Use  . Smoking status: Never Smoker  . Smokeless tobacco: Never Used  Substance Use Topics  . Alcohol use: No  . Drug use: No    Family History  Problem Relation Age of Onset  . Hypertension Mother   . Stroke Mother   . Leukemia Father   . Heart attack Paternal Grandmother   . Stroke Paternal Grandfather   . Heart Problems Son 59  . Heart Problems Son 33  . Uterine cancer Sister   . Prostate cancer Brother   . Colon cancer Brother   . Stroke Sister     Review of Systems: A 12-system review of systems was performed and was negative except as noted in the HPI.  --------------------------------------------------------------------------------------------------  Physical Exam: There were no vitals taken for this visit.  General:  *** HEENT: No conjunctival pallor or scleral icterus. Moist mucous membranes.  OP clear. Neck: Supple without lymphadenopathy, thyromegaly, JVD, or HJR. No carotid bruit. Lungs: Normal work of breathing. Clear to auscultation bilaterally without wheezes or crackles. Heart: Regular rate and rhythm without murmurs, rubs, or gallops. Non-displaced PMI. Abd: Bowel sounds present. Soft, NT/ND without  hepatosplenomegaly Ext: No lower extremity edema. Radial, PT, and DP pulses are 2+ bilaterally. Skin: Warm and dry without rash.  EKG:  ***  Lab Results  Component Value Date   WBC 7.9 12/09/2018   HGB 11.5 (L) 12/09/2018   HCT 34.6 (L) 12/09/2018   MCV 96.6 12/09/2018   PLT 144 (L) 12/09/2018    Lab Results  Component Value Date   NA 138 12/09/2018   K 3.8 12/09/2018   CL 104 12/09/2018   CO2 24 12/09/2018   BUN 37 (H) 12/09/2018   CREATININE 2.28 (H) 12/09/2018   GLUCOSE 129 (H) 12/09/2018   ALT 14 09/15/2018    Lab Results  Component Value Date   CHOL 132 09/15/2018   HDL 33 (L) 09/15/2018   LDLCALC 56 09/15/2018   TRIG 214 (H) 09/15/2018   CHOLHDL 4.0 09/15/2018    --------------------------------------------------------------------------------------------------  ASSESSMENT AND PLAN: Nelva Bush, MD 12/18/2018 7:31 AM

## 2018-12-22 DIAGNOSIS — R69 Illness, unspecified: Secondary | ICD-10-CM | POA: Diagnosis not present

## 2018-12-25 DIAGNOSIS — I351 Nonrheumatic aortic (valve) insufficiency: Secondary | ICD-10-CM | POA: Diagnosis not present

## 2018-12-25 DIAGNOSIS — I712 Thoracic aortic aneurysm, without rupture: Secondary | ICD-10-CM | POA: Diagnosis not present

## 2018-12-25 DIAGNOSIS — R011 Cardiac murmur, unspecified: Secondary | ICD-10-CM | POA: Diagnosis not present

## 2018-12-25 DIAGNOSIS — I10 Essential (primary) hypertension: Secondary | ICD-10-CM | POA: Diagnosis not present

## 2018-12-25 DIAGNOSIS — I350 Nonrheumatic aortic (valve) stenosis: Secondary | ICD-10-CM | POA: Diagnosis not present

## 2018-12-25 DIAGNOSIS — I6522 Occlusion and stenosis of left carotid artery: Secondary | ICD-10-CM | POA: Diagnosis not present

## 2018-12-25 DIAGNOSIS — I499 Cardiac arrhythmia, unspecified: Secondary | ICD-10-CM | POA: Diagnosis not present

## 2018-12-25 DIAGNOSIS — I340 Nonrheumatic mitral (valve) insufficiency: Secondary | ICD-10-CM | POA: Diagnosis not present

## 2018-12-25 DIAGNOSIS — I2510 Atherosclerotic heart disease of native coronary artery without angina pectoris: Secondary | ICD-10-CM | POA: Diagnosis not present

## 2018-12-25 DIAGNOSIS — I361 Nonrheumatic tricuspid (valve) insufficiency: Secondary | ICD-10-CM | POA: Diagnosis not present

## 2019-01-02 DIAGNOSIS — N184 Chronic kidney disease, stage 4 (severe): Secondary | ICD-10-CM | POA: Insufficient documentation

## 2019-01-02 DIAGNOSIS — I129 Hypertensive chronic kidney disease with stage 1 through stage 4 chronic kidney disease, or unspecified chronic kidney disease: Secondary | ICD-10-CM | POA: Diagnosis not present

## 2019-01-02 DIAGNOSIS — R600 Localized edema: Secondary | ICD-10-CM | POA: Diagnosis not present

## 2019-01-02 DIAGNOSIS — D649 Anemia, unspecified: Secondary | ICD-10-CM | POA: Diagnosis not present

## 2019-01-11 ENCOUNTER — Ambulatory Visit: Payer: Medicare HMO | Admitting: Physician Assistant

## 2019-01-19 NOTE — Progress Notes (Signed)
Cardiology Office Note Date:  01/22/2019  Patient ID:  Rodney Wiley, Rodney Wiley [birth year], MRN [id number] PCP:  Remi Haggard, FNP  Cardiologist:  Dr. Saunders Revel, MD    Chief Complaint: Follow up  History of Present Illness: Rodney Wiley is a 83 y.o. male with history of CAD s/p 3-vessel CABG in 02/2018, aortic regurgitation and thoracic aortic aneurysm s/p Bentall with bioprosthetic aortic valve replacement at the time of his CABG, HFpEF, valvular heart disease, CKD stage III with left RAS, SVT, intermittent PVCs, HTN, HLD, diverticulitis, BPH, and GERD who presents for follow up of SVT.  In 01/2018, he underwent diagnostic cath that showed multivessel CAD in the setting of aortic valve and aortic disease. Following this, he underwent Bentall with aortic root replacement, bioprosthetic aortic valve replacement, and 3-vessel CABG with LIMA-LAD, SVG-OM2, SVG-PDA and reimplantation of the left main coronary artery. Post-operative course was complicated by anemia requiring transfusion along with PAF requiring amiodarone and Coumadin along with worsening renal failure. In the spring of 2019, he was noted to have soft BP leading to the holding of torsemide and beta blocker. Following this, he had swelling of the bilateral lower extremities and weight gain. In this setting his torsemide was resumed. In follow up with CVTS his amiodarone was held followed by his Coumadin.  Most recent echo from 04/2018 showed an EF of 50 to 55%, mild concentric LVH, grade 2 diastolic dysfunction, bioprosthetic aortic valve was noted with a mean gradient of 17 mmHg, aortic root was normal in size, a sending aorta was normal in size, mild mitral vegetation, mildly dilated left atrium, RV systolic function was normal, PASP normal.  He was seen in the ED on 12/09/2018 with palpitations and weakness. EKG showed SVT, 161 bpm. Labs showed a K+ 3.8, SCr 2.28, TSH normal, troponin < 0.03, HGB 11.5. CXR not acute. With valsalva, he briefly  converted to sinus rhythm, though redeveloped SVT. He was given Cardizem and ultimately converted to sinus rhythm. He was seen in the office on 12/11/2018 by Dr. Rockey Situ for ED follow up and reported tachypalptiations again in the evening of 12/10/2018 for hours, until 3 AM, that ultimately spontaneously resolved. EKG at that visit showed sinus rhythm. At that visit, his Coreg was decreased to 6.25 mg bid, his amlodipine was stopped, and he was started on long-acting diltiazem 120 mg daily. With these changes, he notified us on 12/17/2018 that he was doing well.   He comes in today accompanied by his daughter-in-law.  He reports he is doing well and has not had any further tachypalpitations.  He has tolerated the addition of Cardizem without issues.  Home blood pressure continues to run in the mid 130s to mid 948N systolic.  Patient's daughter-in-law is quite concerned with these blood pressure readings.  He does note a chest sensation that he describes as "something wrong with the wire."  He does not report this being chest pain and is unable to describe this further.  No dizziness, presyncope, or syncope.  No lower extremity swelling, abdominal distention, orthopnea, PND, or early satiety.  Weight remains stable.  He has not needed any PRN torsemide recently.  No falls, BRBPR, or melena.  Past Medical History:  Diagnosis Date  . Aortic insufficiency    a. 02/2018 s/p bioprosthetic AVR; 04/2018 Echo: EF 50-55%, Gr2 DD, Ao bioprosthesis, mean grad 58mmHg, Ao root/Asc Ao nl in size, mild MR, mildly dil LA, nl RV fxn. Nl PASP.  [name] Kitchen Arthropathy   .  Ascending aortic aneurysm ([place])    a. 12/2016 MRA: 4.3cm @ sinus of valsalva, 4.9cm above sinotubular jxn; b. 02/2018 s/p biological Bentall and AVR.  [name] Kitchen BPH (benign prostatic hyperplasia)   . 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 02/2018 Cath: LAD 20ost, 40p, 55m, LCX 60p, OM2 60, OM3 85, RCA 10p/m w/ L->R and R->R collats;  b. 02/2018 CABG x 3: LIMA to LAD, SVG to OM2, SVG to  PDA, EVH via right thigh.  . CKD (chronic kidney disease), stage III ([place])   . Diverticulitis   . Essential hypertension   . GERD (gastroesophageal reflux disease)   . Hemorrhoids   . History of chicken pox   . History of Helicobacter pylori infection 06/2007  . Hyperlipidemia   . Hypertension   . Left renal artery stenosis ([facility])    a. 01/2017 RA u/s: moderate L RAS.  [name] Kitchen Lower extremity edema   . Nonrheumatic mitral (valve) insufficiency    a. 12/2016 Echo: mild to mod MR; b. 09/2017 TEE: mild to mod MR; c. 04/2018 Echo: Mild MR.  [name] Kitchen Nonrheumatic pulmonary valve insufficiency   . 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 s/p 21 mm Edwards Inspiris Resilia stented bovine pericardial tissue valve and 24 mm Gelweave Valsalva synthetic root conduit with reimplantation of left main coronary artery.    Past Surgical History:  Procedure Laterality Date  . AORTIC VALVE REPAIR N/A 02/22/2018   Procedure: AORTIC VALVE REPLACEMENT -Biological Bentall aortic root replacement,;  Surgeon: Rexene Alberts, MD;  Location: [place];  Service: Open Heart Surgery;  Laterality: N/A;  . bypass surgery    . CARDIAC CATHETERIZATION     01/16/2018  . COLONOSCOPY  2011   by Dr. Candace Cruise with findings of diverticulosis  . CORONARY ARTERY BYPASS GRAFT N/A 02/22/2018   Procedure: CORONARY ARTERY BYPASS GRAFTING (CABG) x three, using left internal mammary artery and right leg greater saphenous vein harvested endoscopically;  Surgeon: Rexene Alberts, MD;  Location: [place];  Service: Open Heart Surgery;  Laterality: N/A;  . CORONARY/GRAFT ANGIOGRAPHY N/A 01/16/2018   Procedure: CORONARY/GRAFT ANGIOGRAPHY;  Surgeon: Nelva Bush, MD;  Location: [place] CV LAB;  Service: Cardiovascular;  Laterality: N/A;  . ELBOW SURGERY Left   . hemorhoidectomy    . RIGHT HEART CATH N/A 01/16/2018   Procedure: RIGHT HEART CATH;  Surgeon: Nelva Bush, MD;   Location: [place] CV LAB;  Service: Cardiovascular;  Laterality: N/A;  . SHOULDER ARTHROSCOPY WITH ROTATOR CUFF REPAIR Right   . SHOULDER SURGERY Right   . TEE WITHOUT CARDIOVERSION N/A 09/27/2017   Procedure: TRANSESOPHAGEAL ECHOCARDIOGRAM (TEE);  Surgeon: Dorothy Spark, MD;  Location: [facility] END[facility];  Service: Cardiovascular;  Laterality: N/A;  . TEE WITHOUT CARDIOVERSION N/A 02/22/2018   Procedure: TRANSESOPHAGEAL ECHOCARDIOGRAM (TEE);  Surgeon: Rexene Alberts, MD;  Location: [place];  Service: Open Heart Surgery;  Laterality: N/A;  . THORACIC AORTIC ANEURYSM REPAIR N/A 02/22/2018   Procedure: THORACIC ASCENDING ANEURYSM REPAIR (AAA) Resection of ascending aorta aneurysm;  Surgeon: Rexene Alberts, MD;  Location: [facility] OR;  Service: Open Heart Surgery;  Laterality: N/A;    Current Meds  Medication Sig  . aspirin EC 81 MG tablet Take 81 mg by mouth daily.  [name] Kitchen atorvastatin (LIPITOR) 20 MG tablet Take 1 tablet (20 mg total) by mouth daily.  . carvedilol (COREG) 6.25 MG tablet Take 1 tablet (6.25 mg total) by mouth  2 (two) times daily.  [name] Kitchen diltiazem (CARDIZEM CD) 120 MG 24 hr capsule Take 1 capsule (120 mg total) by mouth daily.  [name] Kitchen doxazosin (CARDURA) 2 MG tablet Take 1 tablet (2 mg total) by mouth daily.  . ferrous sulfate 325 (65 FE) MG EC tablet Take 325 mg by mouth 2 (two) times daily.   [name] Kitchen omeprazole (PRILOSEC) 20 MG capsule Take 20 mg by mouth daily.  . sennosides-docusate sodium (SENOKOT-S) 8.6-50 MG tablet Take 1 tablet by mouth 2 (two) times daily.   [name] Kitchen torsemide (DEMADEX) 20 MG tablet Take 1 tablet (20 mg total) by mouth daily as needed.  . vitamin C (ASCORBIC ACID) 500 MG tablet Take 500 mg by mouth every evening.     Allergies:   Patient has no known allergies.   Social History:  The patient  reports that he has never smoked. He has never used smokeless tobacco. He reports that he does not drink alcohol or use drugs.   Family History:  The patient's family history includes  Colon cancer in his brother; Heart Problems (age of onset: 65) in his son and son; Heart attack in his paternal grandmother; Hypertension in his mother; Leukemia in his father; Prostate cancer in his brother; Stroke in his mother, paternal grandfather, and sister; Uterine cancer in his sister.  ROS:   Review of Systems  Constitutional: Positive for malaise/fatigue. Negative for chills, diaphoresis, fever and weight loss.  HENT: Negative for congestion.   Eyes: Negative for discharge and redness.  Respiratory: Negative for cough, hemoptysis, sputum production, shortness of breath and wheezing.   Cardiovascular: Positive for palpitations. Negative for chest pain, orthopnea, claudication, leg swelling and PND.  Gastrointestinal: Negative for abdominal pain, blood in stool, heartburn, melena, nausea and vomiting.  Genitourinary: Negative for hematuria.  Musculoskeletal: Negative for falls and myalgias.  Skin: Negative for rash.  Neurological: Positive for weakness. Negative for dizziness, tingling, tremors, sensory change, speech change, focal weakness and loss of consciousness.  Endo/Heme/Allergies: Does not bruise/bleed easily.  Psychiatric/Behavioral: Negative for substance abuse. The patient is not nervous/anxious.   All other systems reviewed and are negative.    PHYSICAL EXAM:  VS:  BP 130/68 (BP Location: Left Arm, Patient Position: Sitting, Cuff Size: Normal)   Pulse 62   Ht 5\' 7"  (1.702 m)   Wt 171 lb 12 oz (77.9 kg)   BMI 26.90 kg/m  BMI: Body mass index is 26.9 kg/m.  Physical Exam  Constitutional: He is oriented to person, place, and time. He appears well-developed and well-nourished.  HENT:  Head: Normocephalic and atraumatic.  Eyes: Right eye exhibits no discharge. Left eye exhibits no discharge.  Neck: Normal range of motion. No JVD present.  Cardiovascular: Normal rate, regular rhythm, S1 normal and S2 normal. Exam reveals no distant heart sounds, no friction rub, no  midsystolic click and no opening snap.  Murmur heard.  Harsh midsystolic murmur is present with a grade of 1/6 at the upper right sternal border radiating to the neck. Pulses:      Posterior tibial pulses are 2+ on the right side and 2+ on the left side.  Pulmonary/Chest: Effort normal and breath sounds normal. No respiratory distress. He has no decreased breath sounds. He has no wheezes. He has no rales. He exhibits no tenderness.  Abdominal: Soft. He exhibits no distension. There is no abdominal tenderness.  Musculoskeletal:        General: No edema.  Neurological: He is alert and oriented to person, place,  and time.  Skin: Skin is warm and dry. No cyanosis. Nails show no clubbing.  Psychiatric: He has a normal mood and affect. His speech is normal and behavior is normal. Judgment and thought content normal.     EKG:  Was ordered and interpreted by me today. Shows NSR, 62 bpm, left axis deviation, left anterior fascicular block, no acute ST-T changes first-degree AV block, frequent PVCs occasionally in a pattern of bigeminy.  Frequent PVCs with rare ventricular couplet noted on rhythm strip  Recent Labs: 09/15/2018: ALT 14; Magnesium 2.5 12/09/2018: BUN 37; Creatinine, Ser 2.28; Hemoglobin 11.5; Platelets 144; Potassium 3.8; Sodium 138; TSH 3.940  09/15/2018: Chol/HDL Ratio 4.0; Cholesterol, Total 132; HDL 33; LDL Calculated 56; Triglycerides 214   CrCl cannot be calculated (Patient's most recent lab result is older than the maximum 21 days allowed.).   Wt Readings from Last 3 Encounters:  01/22/19 171 lb 12 oz (77.9 kg)  12/11/18 169 lb 8 oz (76.9 kg)  12/09/18 172 lb (78 kg)     Other studies reviewed: Additional studies/records reviewed today include: summarized above  ASSESSMENT AND PLAN:  1. CAD involving the native coronary arteries status post CABG without angina: He is doing well without any symptoms concerning for angina.  Continue aspirin, Coreg, and Lipitor.  Aggressive  risk factor modification and secondary prevention.  No plans for further ischemic evaluation at this time.  2. SVT: Doing well without any further tachypalpitations.  Maintaining sinus rhythm with frequent PVCs.  Continue Coreg and Cardizem.  Heart rate precludes titration at this time.  Check labs as below.  3. Frequent PVCs: Schedule III day ZIO monitor to quantify PVC burden.  Continue Coreg and Cardizem.  Heart rate in the 60s bpm precludes titration of these medications at this time.  Recent TSH from 12/2018 normal.  Check BMP and magnesium with recommendation to replete potassium to goal of 4.0 and magnesium to goal of 2.0.  Should he be found to have a significant PVC burden consider referral to EP for PVC ablation given his heart rate precludes escalation of beta-blocker or calcium channel blocker as outlined above.  Recent ischemic evaluation and status post bypass as above.  4. HFpEF: He is well compensated and appears euvolemic on exam today.  He has not needed any PRN torsemide recently.  Continue current medications with CHF education.  5. PAF: Maintaining sinus rhythm.  No longer on full dose anticoagulation given this was in the postoperative state.  Continue Coreg and diltiazem for rate control.  6. Aortic valve insufficiency: Status post bioprosthetic aortic valve replacement.  Recent echo from 04/2018 demonstrated normal function.  Continue to monitor periodically.  7. Hypertension with left-sided renal artery stenosis: Add Imdur 30 mg daily.  Continue Coreg 6.25 mg twice daily along with Cardizem CD 120 mg daily and Cardura 2 mg daily.  His heart rate in the low 60s bpm precludes escalation of carvedilol and diltiazem at this time.   Disposition: F/u with Dr. Saunders Revel or APP in 1 month.  Current medicines are reviewed at length with the patient today.  The patient did not have any concerns regarding medicines.  Signed, Christell Faith, PA-C 01/22/2019 8:32 AM     [place] [address] [place], [place] [address] [phone]

## 2019-01-22 ENCOUNTER — Encounter: Payer: Self-pay | Admitting: Physician Assistant

## 2019-01-22 ENCOUNTER — Ambulatory Visit: Payer: Medicare HMO | Admitting: Physician Assistant

## 2019-01-22 ENCOUNTER — Ambulatory Visit: Payer: Medicare HMO

## 2019-01-22 VITALS — BP 130/68 | HR 62 | Ht 67.0 in | Wt 171.8 lb

## 2019-01-22 DIAGNOSIS — I351 Nonrheumatic aortic (valve) insufficiency: Secondary | ICD-10-CM | POA: Diagnosis not present

## 2019-01-22 DIAGNOSIS — I5032 Chronic diastolic (congestive) heart failure: Secondary | ICD-10-CM

## 2019-01-22 DIAGNOSIS — I493 Ventricular premature depolarization: Secondary | ICD-10-CM | POA: Diagnosis not present

## 2019-01-22 DIAGNOSIS — I2510 Atherosclerotic heart disease of native coronary artery without angina pectoris: Secondary | ICD-10-CM

## 2019-01-22 DIAGNOSIS — Z953 Presence of xenogenic heart valve: Secondary | ICD-10-CM | POA: Diagnosis not present

## 2019-01-22 DIAGNOSIS — I701 Atherosclerosis of renal artery: Secondary | ICD-10-CM

## 2019-01-22 DIAGNOSIS — I480 Paroxysmal atrial fibrillation: Secondary | ICD-10-CM

## 2019-01-22 DIAGNOSIS — I471 Supraventricular tachycardia: Secondary | ICD-10-CM | POA: Diagnosis not present

## 2019-01-22 DIAGNOSIS — I10 Essential (primary) hypertension: Secondary | ICD-10-CM

## 2019-01-22 MED ORDER — ISOSORBIDE MONONITRATE ER 30 MG PO TB24: 30.0000 mg | ORAL_TABLET | Freq: Every day | ORAL | 3 refills | Status: DC

## 2019-01-22 NOTE — Patient Instructions (Signed)
Medication Instructions:  Your physician has recommended you make the following change in your medication:  1- START Imdur Take 1 tablet (30 mg total) by mouth daily.  If you need a refill on your cardiac medications before your next appointment, please call your pharmacy.   Lab work: Your physician recommends that you return for lab work today ([place], CBC, BMET)  If you have labs (blood work) drawn today and your tests are completely normal, you will receive your results only by: [name] Kitchen MyChart Message (if you have MyChart) OR . A paper copy in the mail If you have any lab test that is abnormal or we need to change your treatment, we will call you to review the results.  Testing/Procedures: 1- Zio  A zio monitor was placed today. It will remain on for 3 days. You will then return monitor and event diary in provided box. It takes 1-2 weeks for report to be downloaded and returned to [place]. We will call you with the results. If monitor falls of or has orange flashing light, please call Zio for further instructions.     Follow-Up: At [facility], you and your health needs are our priority.  As part of our continuing mission to provide you with exceptional heart care, we have created designated Provider Care Teams.  These Care Teams include your primary Cardiologist (physician) and Advanced Practice Providers (APPs -  Physician Assistants and Nurse Practitioners) who all work together to provide you with the care you need, when you need it. You will need a follow up appointment in 1 months. You may see Nelva Bush, MD or one of the following Advanced Practice Providers on your designated Care Team:   Murray Hodgkins, NP Christell Faith, PA-C

## 2019-01-23 LAB — CBC
Hematocrit: 31.3 % — ABNORMAL LOW (ref 37.5–51.0)
Hemoglobin: 10.4 g/dL — ABNORMAL LOW (ref 13.0–17.7)
MCH: 31.2 pg (ref 26.6–33.0)
MCHC: 33.2 g/dL (ref 31.5–35.7)
MCV: 94 fL (ref 79–97)
Platelets: 146 10*3/uL — ABNORMAL LOW (ref 150–450)
RBC: 3.33 x10E6/uL — ABNORMAL LOW (ref 4.14–5.80)
RDW: 12.9 % (ref 11.6–15.4)
WBC: 5.5 10*3/uL (ref 3.4–10.8)

## 2019-01-23 LAB — BASIC METABOLIC PANEL
BUN/Creatinine Ratio: 13 (ref 10–24)
BUN: 21 mg/dL (ref 8–27)
CO2: 21 mmol/L (ref 20–29)
Calcium: 8.8 mg/dL (ref 8.6–10.2)
Chloride: 109 mmol/L — ABNORMAL HIGH (ref 96–106)
Creatinine, Ser: 1.67 mg/dL — ABNORMAL HIGH (ref 0.76–1.27)
GFR calc Af Amer: 44 mL/min/{1.73_m2} — ABNORMAL LOW (ref >59)
GFR calc non Af Amer: 38 mL/min/{1.73_m2} — ABNORMAL LOW (ref >59)
Glucose: 119 mg/dL — ABNORMAL HIGH (ref 65–99)
Potassium: 4.1 mmol/L (ref 3.5–5.2)
Sodium: 145 mmol/L — ABNORMAL HIGH (ref 134–144)

## 2019-01-23 LAB — MAGNESIUM: Magnesium: 2 mg/dL (ref 1.6–2.3)

## 2019-01-29 ENCOUNTER — Other Ambulatory Visit: Payer: Self-pay | Admitting: Physician Assistant

## 2019-01-29 MED ORDER — CHLORTHALIDONE 25 MG PO TABS: 25.0000 mg | ORAL_TABLET | Freq: Every day | ORAL | 2 refills | Status: DC

## 2019-01-30 ENCOUNTER — Telehealth: Payer: Self-pay | Admitting: Cardiovascular Disease

## 2019-01-30 MED ORDER — HYDRALAZINE HCL 25 MG PO TABS: 25.0000 mg | ORAL_TABLET | Freq: Three times a day (TID) | ORAL | 3 refills | Status: DC

## 2019-01-30 NOTE — Telephone Encounter (Signed)
  Returned call to patient with information from provider. Medication change made based on pt preference. He reports that he wants to continue taking fluid pill on PRN basis.   F/u as previously planned.   No other orders at this time.  Advised pt to call for any further questions or concerns.  Patient does have a fluid pill on his medication list however he has not been taking this as noted in my recent office visit with him. If he prefers to avoid chlorthalidone, our next option would be hydralazine 25 mg 3 times daily. If he chooses that option, no need for follow-up BMP.   Previous Messages    ----- Message -----  From: Alroy Dust.Daleen Bo, RN  Sent: 01/30/2019  9:02 AM EST  To: Rise Mu, PA-C   [name],   I called pt to let him know of med change. He states that he doesn't want anything with a diuretic in it since he already takes a fluid pill. Is there more education that I could offer to advise him this medication is preferred? He said he is not having swelling in his legs and thinks fluid balance is well managed at this time?   Tyria Springer  ----- Message -----  From: Sindy Messing  Sent: 01/29/2019  4:56 PM EST  To: [facility] Triage   Please schedule patient for BMP 1 week after starting chlorthalidone 25 mg daily (I have sent this Rx in already).

## 2019-01-30 NOTE — Telephone Encounter (Signed)
New Message  Pts wife verbalized pt has been on different types of medication and this morning his BP was 181/101 and she would like to speak to someone in regards the medications he's taking to his BP issue.  Please f/u

## 2019-01-31 NOTE — Telephone Encounter (Signed)
Pt reports BP has continued to be high despite taking all prescribed medications and maintaining heart heart, low sodium diet.    2/25 6:30 PM took first hydralazine dose  7:00 PM BP 212/108  11 PM BP 216/112  12 midnight cant remember but SBP> 200  2/26 8:00 AM took hydralazine 8:30AM BP 209/99  Reports feeling "okay", denies HA, vision changes, diziness, palpitations, tachycardia or swelling.  He did endorse L side "pains" around midnight that lasted about 20 mins but went away and did not return.   Current weight 172 lbs.  Routed to provider to advise.

## 2019-02-01 ENCOUNTER — Encounter: Payer: Self-pay | Admitting: *Deleted

## 2019-02-01 ENCOUNTER — Ambulatory Visit (INDEPENDENT_AMBULATORY_CARE_PROVIDER_SITE_OTHER): Payer: Medicare HMO | Admitting: *Deleted

## 2019-02-01 ENCOUNTER — Telehealth: Payer: Self-pay | Admitting: Cardiovascular Disease

## 2019-02-01 VITALS — BP 194/86 | HR 70 | Ht 67.0 in | Wt 178.8 lb

## 2019-02-01 DIAGNOSIS — I10 Essential (primary) hypertension: Secondary | ICD-10-CM

## 2019-02-01 DIAGNOSIS — I701 Atherosclerosis of renal artery: Secondary | ICD-10-CM | POA: Diagnosis not present

## 2019-02-01 DIAGNOSIS — R002 Palpitations: Secondary | ICD-10-CM | POA: Diagnosis not present

## 2019-02-01 MED ORDER — HYDRALAZINE HCL 25 MG PO TABS: 50.0000 mg | ORAL_TABLET | Freq: Three times a day (TID) | ORAL | 1 refills | Status: DC

## 2019-02-01 NOTE — Telephone Encounter (Signed)
New Message  Daughter in law verbalized they called yesterday due to BP running in the 200's.   Daughter in law verbalized nurse called at lunch and stated that was too high and they would reach out to the provider and call them back but no one ever did.  Daughter in law verbalized she is pleased about no one returning their call yesterday due to patient was in stroke zone all day yesterday.  Daughter in law verbalized they took patient to [employer] to test BP on their machine thinking something was wrong with theirs and was not.  Daughter in law verbalized to call her back.

## 2019-02-01 NOTE — Patient Instructions (Addendum)
Medication Instructions:  INCREASE the Hydralazine to 50 mg three times daily  If you need a refill on your cardiac medications before your next appointment, please call your pharmacy.   Lab work: None ordered  Testing/Procedures: Your physician has requested that you have a renal artery duplex. During this test, an ultrasound is used to evaluate blood flow to the kidneys. Take your medications as you usually do.    No food after 11PM the night before.  Water is OK. (Don't drink liquids if you have been instructed not to for ANOTHER test).  Take two Extra-Strength Gas-X capsules at bedtime the night before test.   Take an additional two Extra-Strength Gas-X capsules three (3) hours before the test or first thing in the morning.    Avoid foods that produce bowel gas, for 24 hours prior to exam (see below).    No breakfast, no chewing gum, no smoking or carbonated beverages.  Patient may take morning medications with water.  Come in for test at least 15 minutes early to register. This has been scheduled for 02/06/2019 at 9:30 at the [facility] office, [place] [phone]  Follow-Up: . Keep follow up as planned  Any Other Special Instructions Will Be Listed Below (If Applicable). If by Saturday your systolic, top number, has not gone down to below 170 increase the Hydralazine to 100 mg three times daily.

## 2019-02-01 NOTE — Telephone Encounter (Signed)
Spoke with patients daughter per release form and reviewed documentation and provider recommendations for patient. She was agreeable with him coming in today for BP check at 2 PM and reviewed that he should try and increase Hydralazine to 2 tablets three times daily (50 mg). Also reviewed we can order renal artery test as well. She reports that his blood pressures have been extremely high and now they are concerned. Will update medication instructions and will have them schedule renal artery test when he comes in today for BP check. Instructed her to have him bring in his home monitor and that way we can compare this with our readings. She verbalized understanding of our conversation, agreement with plan, and had no further questions at this time.

## 2019-02-01 NOTE — Progress Notes (Signed)
The patient came in for a nurse visit with complaints of elevated blood pressures. He has been asymptomatic and had no complaints today during the visit.  Blood pressure today on the home montitor was 203/97 and blood pressure manually was 194/86 with a heart rate of 70.  The patient is currently taking: Carvedilol 6.25 bid Diltiazem 120 mg daily Cardura 2 mg daily Hydralazine 25 tid Imdur 30 mg daily  Per Christell Faith, PA the patient should go ahead and increase the Hydralazine to 50 mg tid as planned. If his systolic has not decreased to 170 and below by Saturday then he should increase the Hydralazine to 100 mg tid.  The patient was scheduled for his renal duplex on 3/24 at the [place] office. A call was placed to the [place] office to see if they could get him in earlier. He is now scheduled for 02/06/2019 in [place]. The patient has verbalized his understanding and will call back if anything further is needed.

## 2019-02-05 ENCOUNTER — Telehealth: Payer: Self-pay | Admitting: *Deleted

## 2019-02-05 NOTE — Telephone Encounter (Signed)
-----   Message from Rise Mu, PA-C sent at 02/05/2019 11:22 AM EST ----- Heart monitor showed the predominant rhythm was sinus with an average heart rate of 61 bpm with a range of 44-114 bpm.  First degree AV block was noted.  Rare PACs with frequent PVCs including periods of ventricular bigeminy.  19 atrial runs were noted, lasting up to 11 beats with a maximal rate of 164 bpm.  No sustained arrhythmias or prolonged pauses were noted.   His heart rate precludes escalation of beta blocker.  PVCs and atrial runs are known.  Recent echo with preserved LV function.  Recent magnesium and potassium at goal.  Normal TSH from 12/2018.

## 2019-02-05 NOTE — Telephone Encounter (Signed)
Reviewed results with patient in detail. Confirmed upcoming appointment with provider and he verbalized understanding with no further questions at this time. He was appreciative for the call.

## 2019-02-05 NOTE — Telephone Encounter (Signed)
Called patient to get update after RN visit last week for hypertension.   He reports that he started taking 2 tablets ( 100 mg total) 3 x daily on Friday but has continued to have high blood pressure. He did not have current readings at this time as he was not home but will send them in on my chart later on when he is able.

## 2019-02-06 ENCOUNTER — Ambulatory Visit (HOSPITAL_COMMUNITY)
Admission: RE | Admit: 2019-02-06 | Discharge: 2019-02-06 | Disposition: A | Discharge status: Home / Self Care | Payer: Medicare HMO | Source: Ambulatory Visit | Attending: Cardiology | Admitting: Cardiology

## 2019-02-06 DIAGNOSIS — I701 Atherosclerosis of renal artery: Secondary | ICD-10-CM | POA: Diagnosis not present

## 2019-02-07 ENCOUNTER — Telehealth: Payer: Self-pay | Admitting: *Deleted

## 2019-02-07 MED ORDER — AMLODIPINE BESYLATE 10 MG PO TABS: 10.0000 mg | ORAL_TABLET | Freq: Every day | ORAL | 5 refills | Status: DC

## 2019-02-07 NOTE — Telephone Encounter (Signed)
Spoke with the pt wife. She sts that the pt BP readings over the weekend were sent in the pt 02/05/19 mychat message. The pt was to call back to update Anderson Malta, RN on how he is currently taking his Hydralazine. The pt has Hydralazine 25mg  tabs at home and has been taking 75mg  tid. The pt BP on his lunch break 215/105. Pt wife is concerned that the pt BP has been consistently elevated for several days. Adv Mrs.Arca that I will fwd the update to [place] and Dr.End and call back with his recommendation.

## 2019-02-07 NOTE — Telephone Encounter (Signed)
Results called to pt. Pt verbalized understanding.  He then talked about his BP and how he has started taking 2 tablets of hydralazine three times a day. He does not feel it is helping to lower his BP. He is currently at work and does not have the BP readings with him. He will go home at lunch to get the paper and also verify the milligram on the tablet of hydralazine is so that we can increase it appropriately. Then he will call us and let us know.

## 2019-02-07 NOTE — Telephone Encounter (Signed)
As BP is not well controlled on current regimen, I recommend continuing current dose of hydralazine and switching diltiazem to amlodipine, as directed in my last message.  Nelva Bush, MD [facility] Pager: [phone]

## 2019-02-07 NOTE — Telephone Encounter (Signed)
Please have Mr. Lampe stop taking diltiazem and begin taking amlodipine 10 mg daily.  Nelva Bush, MD [facility] Pager: [phone]

## 2019-02-07 NOTE — Telephone Encounter (Signed)
Spoke with Rodney Wiley and made her aware of Dr.End's recommendation. Stop Diltiazem and start Amlodipine 10mg  daily. Rx sent to the pt pharmacy. Carylon Perches that we will confirm with Dr.End if the pt should continue the higher dose of Hydralazine 75mg  tid and give them a call back with his response.

## 2019-02-07 NOTE — Telephone Encounter (Signed)
Daughter Davy Pique returning call to discuss.

## 2019-02-07 NOTE — Telephone Encounter (Signed)
Called the pt wife to make her aware of Dr.End's recommendation. lmtcb Called the pt as well. Unable to lmom his voicemail is full.

## 2019-02-07 NOTE — Telephone Encounter (Signed)
-----   Message from Nelva Bush, MD sent at 02/06/2019  4:57 PM EST ----- Please let Rodney Wiley know that his renal artery Doppler does not show evidence of significant narrowing in either renal artery.  He should continue his current medications and f/u as previously discussed.

## 2019-02-07 NOTE — Telephone Encounter (Signed)
Follow Up:      Wife calling you back with blood pressure readings.

## 2019-02-08 DIAGNOSIS — E538 Deficiency of other specified B group vitamins: Secondary | ICD-10-CM | POA: Diagnosis not present

## 2019-02-08 DIAGNOSIS — I480 Paroxysmal atrial fibrillation: Secondary | ICD-10-CM | POA: Diagnosis not present

## 2019-02-08 DIAGNOSIS — I493 Ventricular premature depolarization: Secondary | ICD-10-CM | POA: Diagnosis not present

## 2019-02-08 DIAGNOSIS — I5032 Chronic diastolic (congestive) heart failure: Secondary | ICD-10-CM | POA: Diagnosis not present

## 2019-02-08 DIAGNOSIS — I471 Supraventricular tachycardia: Secondary | ICD-10-CM | POA: Diagnosis not present

## 2019-02-08 DIAGNOSIS — D649 Anemia, unspecified: Secondary | ICD-10-CM | POA: Diagnosis not present

## 2019-02-08 DIAGNOSIS — Z953 Presence of xenogenic heart valve: Secondary | ICD-10-CM | POA: Diagnosis not present

## 2019-02-08 DIAGNOSIS — I701 Atherosclerosis of renal artery: Secondary | ICD-10-CM | POA: Diagnosis not present

## 2019-02-08 NOTE — Telephone Encounter (Signed)
SPoke with Davy Pique, ok per DPR. She verbalized understanding to continue the hydralazine along with stopping diltiazem and starting amlodipine.  Patient made the switch last night.  At 11 pm last night BP 215/101. Yesterday at noon it was 192/105, prior to the change.  She has not heard from patient this morning but he usually calls around 9 am. She will have him monitor BP and MyChart message if the readings are still high. Patient denies headache, blurred vision, chest pain or shortness of breath.

## 2019-02-18 NOTE — Progress Notes (Deleted)
Cardiology Office Note Date:  02/18/2019  Patient ID:  Rodney Wiley, Rodney Wiley Jul 11, 1936, MRN [id number] PCP:  Remi Haggard, FNP  Cardiologist:  Dr. Saunders Revel, MD  ***refresh   Chief Complaint: Follow up  History of Present Illness: Rodney Wiley is a 83 y.o. male with history of ***   Past Medical History:  Diagnosis Date  . Aortic insufficiency    a. 02/2018 s/p bioprosthetic AVR; 04/2018 Echo: EF 50-55%, Gr2 DD, Ao bioprosthesis, mean grad 42mmHg, Ao root/Asc Ao nl in size, mild MR, mildly dil LA, nl RV fxn. Nl PASP.  [name] Kitchen Arthropathy   . Ascending aortic aneurysm ([place])    a. 12/2016 MRA: 4.3cm @ sinus of valsalva, 4.9cm above sinotubular jxn; b. 02/2018 s/p biological Bentall and AVR.  [name] Kitchen BPH (benign prostatic hyperplasia)   . 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 02/2018 Cath: LAD 20ost, 40p, 47m, LCX 60p, OM2 60, OM3 85, RCA 10p/m w/ L->R and R->R collats;  b. 02/2018 CABG x 3: LIMA to LAD, SVG to OM2, SVG to PDA, EVH via right thigh.  . CKD (chronic kidney disease), stage III ([place])   . Diverticulitis   . Essential hypertension   . GERD (gastroesophageal reflux disease)   . Hemorrhoids   . History of chicken pox   . History of Helicobacter pylori infection 06/2007  . Hyperlipidemia   . Hypertension   . Left renal artery stenosis ([facility])    a. 01/2017 RA u/s: moderate L RAS.  [name] Kitchen Lower extremity edema   . Nonrheumatic mitral (valve) insufficiency    a. 12/2016 Echo: mild to mod MR; b. 09/2017 TEE: mild to mod MR; c. 04/2018 Echo: Mild MR.  [name] Kitchen Nonrheumatic pulmonary valve insufficiency   . 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 s/p 21 mm Edwards Inspiris Resilia stented bovine pericardial tissue valve and 24 mm Gelweave Valsalva synthetic root conduit with reimplantation of left main coronary artery.    Past Surgical History:  Procedure Laterality Date  . AORTIC VALVE REPAIR N/A 02/22/2018   Procedure: AORTIC VALVE  REPLACEMENT -Biological Bentall aortic root replacement,;  Surgeon: Rexene Alberts, MD;  Location: [place];  Service: Open Heart Surgery;  Laterality: N/A;  . bypass surgery    . CARDIAC CATHETERIZATION     01/16/2018  . COLONOSCOPY  2011   by Dr. Candace Cruise with findings of diverticulosis  . CORONARY ARTERY BYPASS GRAFT N/A 02/22/2018   Procedure: CORONARY ARTERY BYPASS GRAFTING (CABG) x three, using left internal mammary artery and right leg greater saphenous vein harvested endoscopically;  Surgeon: Rexene Alberts, MD;  Location: [place];  Service: Open Heart Surgery;  Laterality: N/A;  . CORONARY/GRAFT ANGIOGRAPHY N/A 01/16/2018   Procedure: CORONARY/GRAFT ANGIOGRAPHY;  Surgeon: Nelva Bush, MD;  Location: [place] CV LAB;  Service: Cardiovascular;  Laterality: N/A;  . ELBOW SURGERY Left   . hemorhoidectomy    . RIGHT HEART CATH N/A 01/16/2018   Procedure: RIGHT HEART CATH;  Surgeon: Nelva Bush, MD;  Location: [place] CV LAB;  Service: Cardiovascular;  Laterality: N/A;  . SHOULDER ARTHROSCOPY WITH ROTATOR CUFF REPAIR Right   . SHOULDER SURGERY Right   . TEE WITHOUT CARDIOVERSION N/A 09/27/2017   Procedure: TRANSESOPHAGEAL ECHOCARDIOGRAM (TEE);  Surgeon: Dorothy Spark, MD;  Location: [facility] ENDOSCOPY;  Service: Cardiovascular;  Laterality: N/A;  . TEE WITHOUT CARDIOVERSION N/A 02/22/2018   Procedure: TRANSESOPHAGEAL ECHOCARDIOGRAM (TEE);  Surgeon:  Rexene Alberts, MD;  Location: [place];  Service: Open Heart Surgery;  Laterality: N/A;  . THORACIC AORTIC ANEURYSM REPAIR N/A 02/22/2018   Procedure: THORACIC ASCENDING ANEURYSM REPAIR (AAA) Resection of ascending aorta aneurysm;  Surgeon: Rexene Alberts, MD;  Location: [facility] OR;  Service: Open Heart Surgery;  Laterality: N/A;    No outpatient medications have been marked as taking for the 03/02/19 encounter (Appointment) with Rise Mu, PA-C.    Allergies:   Patient has no known allergies.   Social History:  The patient  reports that  he has never smoked. He has never used smokeless tobacco. He reports that he does not drink alcohol or use drugs.   Family History:  The patient's family history includes Colon cancer in his brother; Heart Problems (age of onset: 33) in his son and son; Heart attack in his paternal grandmother; Hypertension in his mother; Leukemia in his father; Prostate cancer in his brother; Stroke in his mother, paternal grandfather, and sister; Uterine cancer in his sister.  ROS:   ROS   PHYSICAL EXAM: *** VS:  There were no vitals taken for this visit. BMI: There is no height or weight on file to calculate BMI.  Physical Exam   EKG:  Was ordered and interpreted by me today. Shows ***  Recent Labs: 09/15/2018: ALT 14 12/09/2018: TSH 3.940 01/22/2019: BUN 21; Creatinine, Ser 1.67; Hemoglobin 10.4; Magnesium 2.0; Platelets 146; Potassium 4.1; Sodium 145  09/15/2018: Chol/HDL Ratio 4.0; Cholesterol, Total 132; HDL 33; LDL Calculated 56; Triglycerides 214   CrCl cannot be calculated (Patient's most recent lab result is older than the maximum 21 days allowed.).   Wt Readings from Last 3 Encounters:  02/01/19 178 lb 12.8 oz (81.1 kg)  01/22/19 171 lb 12 oz (77.9 kg)  12/11/18 169 lb 8 oz (76.9 kg)     Other studies reviewed: Additional studies/records reviewed today include: summarized above  ASSESSMENT AND PLAN:  1. ***  Disposition: F/u with Dr. Saunders Revel or an APP in ***.  Current medicines are reviewed at length with the patient today.  The patient did not have any concerns regarding medicines.  Signed, Christell Faith, PA-C 02/18/2019 3:02 PM     [address], [place] [id number] [phone]

## 2019-02-27 ENCOUNTER — Ambulatory Visit: Payer: Medicare HMO | Admitting: Physician Assistant

## 2019-03-01 ENCOUNTER — Telehealth: Payer: Self-pay

## 2019-03-01 NOTE — Telephone Encounter (Signed)
Cardiac Questionnaire:  Since your last visit or hospitalization:  1. Have you been having chest pain? no  2. Have you been having shortness of breath? no  3. Have you been having increasing edema, wt gain, or increase in abdominal girth (pants fitting more tightly)? no  4. Have you had any passing out spells? No  ?  COVID-19 Pre-Screening Questions:  . Have you been in contact with someone that was recently sick with fever/cough or confirmed to have the [place] virus? no  *Contact with a confirmed case should stay at home, away from confirmed patient, monitor symptoms, and reach out to PCP for e-visit/additional testing.  2. Do you have any of the following symptoms [cough, fever (100.4 or greater)], and/or shortness of breath)? No  ?  Pt did not need refills at this time.  ?  Will cancel appt and route to COVID cancel pool.   Pt reports he is feeling well at this time and denies new or worsening sx. He agreed to call us back in the event they occur.

## 2019-03-02 ENCOUNTER — Ambulatory Visit: Payer: Medicare HMO | Admitting: Physician Assistant

## 2019-03-05 ENCOUNTER — Encounter: Payer: Medicare HMO | Admitting: Thoracic Surgery (Cardiothoracic Vascular Surgery)

## 2019-03-06 NOTE — Telephone Encounter (Signed)
Patient has refused evisit  Will wait to reschedule when office opens

## 2019-03-15 NOTE — Telephone Encounter (Signed)
Recall placed for august  

## 2019-04-06 ENCOUNTER — Other Ambulatory Visit: Payer: Medicare HMO

## 2019-04-06 ENCOUNTER — Inpatient Hospital Stay: Payer: Medicare HMO | Admitting: Oncology

## 2019-04-23 ENCOUNTER — Encounter: Payer: Medicare HMO | Admitting: Thoracic Surgery (Cardiothoracic Vascular Surgery)

## 2019-05-17 ENCOUNTER — Other Ambulatory Visit: Payer: Self-pay

## 2019-05-18 ENCOUNTER — Inpatient Hospital Stay (HOSPITAL_BASED_OUTPATIENT_CLINIC_OR_DEPARTMENT_OTHER): Payer: Medicare HMO | Admitting: Oncology

## 2019-05-18 ENCOUNTER — Encounter: Payer: Self-pay | Admitting: Oncology

## 2019-05-18 ENCOUNTER — Inpatient Hospital Stay: Payer: Medicare HMO | Attending: Oncology

## 2019-05-18 ENCOUNTER — Other Ambulatory Visit: Payer: Self-pay

## 2019-05-18 VITALS — BP 127/72 | HR 71 | Temp 98.4°F | Resp 18 | Ht 67.0 in | Wt 172.5 lb

## 2019-05-18 DIAGNOSIS — I129 Hypertensive chronic kidney disease with stage 1 through stage 4 chronic kidney disease, or unspecified chronic kidney disease: Secondary | ICD-10-CM

## 2019-05-18 DIAGNOSIS — D631 Anemia in chronic kidney disease: Secondary | ICD-10-CM

## 2019-05-18 DIAGNOSIS — D696 Thrombocytopenia, unspecified: Secondary | ICD-10-CM

## 2019-05-18 DIAGNOSIS — N183 Chronic kidney disease, stage 3 (moderate): Secondary | ICD-10-CM | POA: Diagnosis not present

## 2019-05-18 LAB — COMPREHENSIVE METABOLIC PANEL
ALT: 16 U/L (ref 0–44)
AST: 20 U/L (ref 15–41)
Albumin: 3.8 g/dL (ref 3.5–5.0)
Alkaline Phosphatase: 58 U/L (ref 38–126)
Anion gap: 9 (ref 5–15)
BUN: 32 mg/dL — ABNORMAL HIGH (ref 8–23)
CO2: 19 mmol/L — ABNORMAL LOW (ref 22–32)
Calcium: 8.4 mg/dL — ABNORMAL LOW (ref 8.9–10.3)
Chloride: 112 mmol/L — ABNORMAL HIGH (ref 98–111)
Creatinine, Ser: 2.16 mg/dL — ABNORMAL HIGH (ref 0.61–1.24)
GFR calc Af Amer: 32 mL/min — ABNORMAL LOW (ref >60)
GFR calc non Af Amer: 28 mL/min — ABNORMAL LOW (ref >60)
Glucose, Bld: 122 mg/dL — ABNORMAL HIGH (ref 70–99)
Potassium: 4.2 mmol/L (ref 3.5–5.1)
Sodium: 140 mmol/L (ref 135–145)
Total Bilirubin: 0.9 mg/dL (ref 0.3–1.2)
Total Protein: 6.8 g/dL (ref 6.5–8.1)

## 2019-05-18 LAB — CBC WITH DIFFERENTIAL/PLATELET
Abs Immature Granulocytes: 0.01 10*3/uL (ref 0.00–0.07)
Basophils Absolute: 0 10*3/uL (ref 0.0–0.1)
Basophils Relative: 0 %
Eosinophils Absolute: 0.2 10*3/uL (ref 0.0–0.5)
Eosinophils Relative: 3 %
HCT: 30.1 % — ABNORMAL LOW (ref 39.0–52.0)
Hemoglobin: 10.2 g/dL — ABNORMAL LOW (ref 13.0–17.0)
Immature Granulocytes: 0 %
Lymphocytes Relative: 22 %
Lymphs Abs: 1.2 10*3/uL (ref 0.7–4.0)
MCH: 31.9 pg (ref 26.0–34.0)
MCHC: 33.9 g/dL (ref 30.0–36.0)
MCV: 94.1 fL (ref 80.0–100.0)
Monocytes Absolute: 0.5 10*3/uL (ref 0.1–1.0)
Monocytes Relative: 10 %
Neutro Abs: 3.5 10*3/uL (ref 1.7–7.7)
Neutrophils Relative %: 65 %
Platelets: 117 10*3/uL — ABNORMAL LOW (ref 150–400)
RBC: 3.2 MIL/uL — ABNORMAL LOW (ref 4.22–5.81)
RDW: 13.1 % (ref 11.5–15.5)
WBC: 5.3 10*3/uL (ref 4.0–10.5)
nRBC: 0 % (ref 0.0–0.2)

## 2019-05-18 LAB — IRON AND TIBC
Iron: 57 ug/dL (ref 45–182)
Saturation Ratios: 21 % (ref 17.9–39.5)
TIBC: 267 ug/dL (ref 250–450)
UIBC: 210 ug/dL

## 2019-05-18 LAB — FERRITIN: Ferritin: 87 ng/mL (ref 24–336)

## 2019-05-18 NOTE — Progress Notes (Signed)
Patient has no complaints eating and drinking ok. Bowels are good. Energy is the same.

## 2019-05-19 ENCOUNTER — Other Ambulatory Visit: Payer: Self-pay | Admitting: *Deleted

## 2019-05-19 DIAGNOSIS — D631 Anemia in chronic kidney disease: Secondary | ICD-10-CM

## 2019-05-19 DIAGNOSIS — N189 Chronic kidney disease, unspecified: Secondary | ICD-10-CM

## 2019-05-21 NOTE — Progress Notes (Signed)
Hematology/Oncology Consult note [facility]  Telephone:([phone] [phone]  Patient Care Team: Remi Haggard, FNP as PCP - General (Family Medicine) End, Harrell Gave, MD as PCP - Cardiology (Cardiology)   Name of the patient: Rodney Wiley  [id number]  11/12/1936   Date of visit: 05/21/19  Diagnosis- anemia likely due to CKD Mild thrombocytopenia  Chief complaint/ Reason for visit- routine f/u of anemia and thrombocytopenia  Heme/Onc history: patient is a 83 year old male with a past medical history significant for hypertension, hyperlipidemia, stage III CKD and history of aortic insufficiency as well as ascending aortic aneurysm. He recently underwent bioprosthetic aortic valve replacement, CABG x3 and resuspension of the left atrium on 02/22/2018.   Results of blood work from 03/24/2018 were as follows: CBC showed white count of 7.7, H&H 11.4/34.1 with an MCV of 93.9 and a platelet count of 134. CMP showed elevated creatinine of 2.4. AST and ALT were normal. Folate and B12 are normal. Ferritin and iron studies were normal. Haptoglobin and TSH was normal. Multiple myeloma panel revealed no monoclonal protein.   Interval history- overall feels well. Has mild chronic fatigue. Denies any new complaints  ECOG PS- 1 Pain scale- 0   Review of systems- Review of Systems  Constitutional: Negative for chills, fever, malaise/fatigue and weight loss.  HENT: Negative for congestion, ear discharge and nosebleeds.   Eyes: Negative for blurred vision.  Respiratory: Negative for cough, hemoptysis, sputum production, shortness of breath and wheezing.   Cardiovascular: Negative for chest pain, palpitations, orthopnea and claudication.  Gastrointestinal: Negative for abdominal pain, blood in stool, constipation, diarrhea, heartburn, melena, nausea and vomiting.  Genitourinary: Negative for dysuria, flank pain, frequency, hematuria and urgency.   Musculoskeletal: Negative for back pain, joint pain and myalgias.  Skin: Negative for rash.  Neurological: Negative for dizziness, tingling, focal weakness, seizures, weakness and headaches.  Endo/Heme/Allergies: Does not bruise/bleed easily.  Psychiatric/Behavioral: Negative for depression and suicidal ideas. The patient does not have insomnia.       No Known Allergies   Past Medical History:  Diagnosis Date  . Anemia   . Aortic insufficiency    a. 02/2018 s/p bioprosthetic AVR; 04/2018 Echo: EF 50-55%, Gr2 DD, Ao bioprosthesis, mean grad 68mHg, Ao root/Asc Ao nl in size, mild MR, mildly dil LA, nl RV fxn. Nl PASP.  .[name] KitchenArthropathy   . Ascending aortic aneurysm ([place]    a. 12/2016 MRA: 4.3cm @ sinus of valsalva, 4.9cm above sinotubular jxn; b. 02/2018 s/p biological Bentall and AVR.  .[name] KitchenBPH (benign prostatic hyperplasia)   . 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 02/2018 Cath: LAD 20ost, 40p, 771mLCX 60p, OM2 60, OM3 85, RCA 10p/m w/ L->R and R->R collats;  b. 02/2018 CABG x 3: LIMA to LAD, SVG to OM2, SVG to PDA, EVH via right thigh.  . CKD (chronic kidney disease), stage III ([place]  . Diverticulitis   . Essential hypertension   . GERD (gastroesophageal reflux disease)   . Hemorrhoids   . History of chicken pox   . History of Helicobacter pylori infection 06/2007  . Hyperlipidemia   . Hypertension   . Left renal artery stenosis (HCC)    a. 01/2017 RA u/s: moderate L RAS.  . [name] Kitchenower extremity edema   . Nonrheumatic mitral (valve) insufficiency    a. 12/2016 Echo: mild to mod MR; b. 09/2017 TEE: mild to mod MR; c. 04/2018 Echo: Mild MR.  . [name] Kitchenonrheumatic pulmonary valve  insufficiency   . 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 s/p 21 mm Edwards Inspiris Resilia stented bovine pericardial tissue valve and 24 mm Gelweave Valsalva synthetic root conduit with reimplantation of left main coronary artery.     Past Surgical  History:  Procedure Laterality Date  . AORTIC VALVE REPAIR N/A 02/22/2018   Procedure: AORTIC VALVE REPLACEMENT -Biological Bentall aortic root replacement,;  Surgeon: Rexene Alberts, MD;  Location: [place];  Service: Open Heart Surgery;  Laterality: N/A;  . bypass surgery    . CARDIAC CATHETERIZATION     01/16/2018  . COLONOSCOPY  2011   by Dr. Candace Cruise with findings of diverticulosis  . CORONARY ARTERY BYPASS GRAFT N/A 02/22/2018   Procedure: CORONARY ARTERY BYPASS GRAFTING (CABG) x three, using left internal mammary artery and right leg greater saphenous vein harvested endoscopically;  Surgeon: Rexene Alberts, MD;  Location: [place];  Service: Open Heart Surgery;  Laterality: N/A;  . CORONARY/GRAFT ANGIOGRAPHY N/A 01/16/2018   Procedure: CORONARY/GRAFT ANGIOGRAPHY;  Surgeon: Nelva Bush, MD;  Location: [place] CV LAB;  Service: Cardiovascular;  Laterality: N/A;  . ELBOW SURGERY Left   . hemorhoidectomy    . RIGHT HEART CATH N/A 01/16/2018   Procedure: RIGHT HEART CATH;  Surgeon: Nelva Bush, MD;  Location: [place] CV LAB;  Service: Cardiovascular;  Laterality: N/A;  . SHOULDER ARTHROSCOPY WITH ROTATOR CUFF REPAIR Right   . SHOULDER SURGERY Right   . TEE WITHOUT CARDIOVERSION N/A 09/27/2017   Procedure: TRANSESOPHAGEAL ECHOCARDIOGRAM (TEE);  Surgeon: Dorothy Spark, MD;  Location: [facility] ENDOSCOPY;  Service: Cardiovascular;  Laterality: N/A;  . TEE WITHOUT CARDIOVERSION N/A 02/22/2018   Procedure: TRANSESOPHAGEAL ECHOCARDIOGRAM (TEE);  Surgeon: Rexene Alberts, MD;  Location: [place];  Service: Open Heart Surgery;  Laterality: N/A;  . THORACIC AORTIC ANEURYSM REPAIR N/A 02/22/2018   Procedure: THORACIC ASCENDING ANEURYSM REPAIR (AAA) Resection of ascending aorta aneurysm;  Surgeon: Rexene Alberts, MD;  Location: [facility] OR;  Service: Open Heart Surgery;  Laterality: N/A;    Social History   Socioeconomic History  . Marital status: Unknown    Spouse name: Not on file  . Number of  children: Not on file  . Years of education: Not on file  . Highest education level: Not on file  Occupational History  . Not on file  Social Needs  . Financial resource strain: Not on file  . Food insecurity    Worry: Not on file    Inability: Not on file  . Transportation needs    Medical: Not on file    Non-medical: Not on file  Tobacco Use  . Smoking status: Never Smoker  . Smokeless tobacco: Never Used  Substance and Sexual Activity  . Alcohol use: No  . Drug use: No  . Sexual activity: Not Currently  Lifestyle  . Physical activity    Days per week: Not on file    Minutes per session: Not on file  . Stress: Not on file  Relationships  . Social Herbalist on phone: Not on file    Gets together: Not on file    Attends religious service: Not on file    Active member of club or organization: Not on file    Attends meetings of clubs or organizations: Not on file    Relationship status: Not on file  . Intimate partner violence    Fear of current  or ex partner: Not on file    Emotionally abused: Not on file    Physically abused: Not on file    Forced sexual activity: Not on file  Other Topics Concern  . Not on file  Social History Narrative  . Not on file    Family History  Problem Relation Age of Onset  . Hypertension Mother   . Stroke Mother   . Leukemia Father   . Heart attack Paternal Grandmother   . Stroke Paternal Grandfather   . Heart Problems Son 61  . Heart Problems Son 67  . Uterine cancer Sister   . Prostate cancer Brother   . Colon cancer Brother   . Kidney failure Brother   . Stroke Sister      Current Outpatient Medications:  .  amLODipine (NORVASC) 10 MG tablet, Take 1 tablet (10 mg total) by mouth daily., Disp: 30 tablet, Rfl: 5 .  aspirin EC 81 MG tablet, Take 81 mg by mouth daily., Disp: , Rfl:  .  atorvastatin (LIPITOR) 20 MG tablet, Take 1 tablet (20 mg total) by mouth daily., Disp: 90 tablet, Rfl: 3 .  carvedilol (COREG)  6.25 MG tablet, Take 1 tablet (6.25 mg total) by mouth 2 (two) times daily., Disp: , Rfl:  .  cholecalciferol (VITAMIN D3) 25 MCG (1000 UT) tablet, Take 1,000 Units by mouth daily., Disp: , Rfl:  .  doxazosin (CARDURA) 2 MG tablet, Take 1 tablet (2 mg total) by mouth daily., Disp: 90 tablet, Rfl: 2 .  ferrous sulfate 325 (65 FE) MG EC tablet, Take 325 mg by mouth 2 (two) times daily. , Disp: , Rfl:  .  hydrALAZINE (APRESOLINE) 25 MG tablet, Take 2 tablets (50 mg total) by mouth 3 (three) times daily., Disp: 540 tablet, Rfl: 1 .  isosorbide mononitrate (IMDUR) 30 MG 24 hr tablet, Take 1 tablet (30 mg total) by mouth daily., Disp: 90 tablet, Rfl: 3 .  omeprazole (PRILOSEC) 20 MG capsule, Take 20 mg by mouth daily., Disp: , Rfl:  .  sennosides-docusate sodium (SENOKOT-S) 8.6-50 MG tablet, Take 1 tablet by mouth 2 (two) times daily. , Disp: , Rfl:  .  torsemide (DEMADEX) 20 MG tablet, Take 1 tablet (20 mg total) by mouth daily as needed., Disp: 180 tablet, Rfl: 3 .  vitamin C (ASCORBIC ACID) 500 MG tablet, Take 500 mg by mouth every evening. , Disp: , Rfl:   Physical exam:  Vitals:   05/18/19 1311  BP: 127/72  Pulse: 71  Resp: 18  Temp: 98.4 F (36.9 C)  TempSrc: Tympanic  Weight: 172 lb 8 oz (78.2 kg)  Height: '5\' 7"'  (1.702 m)   Physical Exam Constitutional:      General: He is not in acute distress. HENT:     Head: Normocephalic and atraumatic.  Eyes:     Pupils: Pupils are equal, round, and reactive to light.  Neck:     Musculoskeletal: Normal range of motion.  Cardiovascular:     Rate and Rhythm: Normal rate and regular rhythm.     Heart sounds: Normal heart sounds.  Pulmonary:     Effort: Pulmonary effort is normal.     Breath sounds: Normal breath sounds.  Abdominal:     General: Bowel sounds are normal.     Palpations: Abdomen is soft.  Skin:    General: Skin is warm and dry.  Neurological:     Mental Status: He is alert and oriented to person, place, and time.  CMP Latest Ref Rng & Units 05/18/2019  Glucose 70 - 99 mg/dL 122(H)  BUN 8 - 23 mg/dL 32(H)  Creatinine 0.61 - 1.24 mg/dL 2.16(H)  Sodium 135 - 145 mmol/L 140  Potassium 3.5 - 5.1 mmol/L 4.2  Chloride 98 - 111 mmol/L 112(H)  CO2 22 - 32 mmol/L 19(L)  Calcium 8.9 - 10.3 mg/dL 8.4(L)  Total Protein 6.5 - 8.1 g/dL 6.8  Total Bilirubin 0.3 - 1.2 mg/dL 0.9  Alkaline Phos 38 - 126 U/L 58  AST 15 - 41 U/L 20  ALT 0 - 44 U/L 16   CBC Latest Ref Rng & Units 05/18/2019  WBC 4.0 - 10.5 K/uL 5.3  Hemoglobin 13.0 - 17.0 g/dL 10.2(L)  Hematocrit 39.0 - 52.0 % 30.1(L)  Platelets 150 - 400 K/uL 117(L)     Assessment and plan- Patient is a 83 y.o. male with following issues:  1. Anemia of chronic kidney disease- hb 10.2 today with normal iron studies. No need for EPO at this time  2. Thrombocytopenia: mild. Continue to monitor  rtc in 6 months with cbc ferritin and iron studies   Visit Diagnosis 1. Anemia of chronic renal failure, unspecified CKD stage   2. Thrombocytopenia ([place])      Dr. Randa Evens, MD, MPH [facility] [phone] 05/21/2019 8:11 AM

## 2019-06-11 ENCOUNTER — Other Ambulatory Visit: Payer: Self-pay | Admitting: Internal Medicine

## 2019-06-18 ENCOUNTER — Encounter: Payer: Medicare HMO | Admitting: Thoracic Surgery (Cardiothoracic Vascular Surgery)

## 2019-06-21 ENCOUNTER — Other Ambulatory Visit: Payer: Self-pay | Admitting: Internal Medicine

## 2019-06-22 ENCOUNTER — Telehealth: Payer: Self-pay

## 2019-06-22 MED ORDER — CARVEDILOL 6.25 MG PO TABS: 6.2500 mg | ORAL_TABLET | Freq: Two times a day (BID) | ORAL | Status: DC

## 2019-06-22 NOTE — Telephone Encounter (Signed)
Refill of Carvedilol 6.25 sent to [facility].

## 2019-06-27 ENCOUNTER — Other Ambulatory Visit: Payer: Self-pay | Admitting: Internal Medicine

## 2019-06-27 ENCOUNTER — Other Ambulatory Visit: Payer: Self-pay

## 2019-06-27 MED ORDER — CARVEDILOL 6.25 MG PO TABS: 6.2500 mg | ORAL_TABLET | Freq: Two times a day (BID) | ORAL | 0 refills | Status: DC

## 2019-06-27 MED ORDER — CARVEDILOL 6.25 MG PO TABS: 6.2500 mg | ORAL_TABLET | Freq: Two times a day (BID) | ORAL | 1 refills | Status: DC

## 2019-06-27 NOTE — Telephone Encounter (Signed)
Per fax from [facility], patient requesting 90 day supply of cardvedilol. Rx resent for 90 day supply.

## 2019-06-27 NOTE — Telephone Encounter (Signed)
Requested Prescriptions   Signed Prescriptions Disp Refills  . carvedilol (COREG) 6.25 MG tablet 180 tablet 0    Sig: Take 1 tablet (6.25 mg total) by mouth 2 (two) times daily.    Authorizing Provider: Rise Mu    Ordering User: Raelene Bott, BRANDY L

## 2019-06-27 NOTE — Telephone Encounter (Signed)
°*  STAT* If patient is at the pharmacy, call can be transferred to refill team.   1. Which medications need to be refilled? (please list name of each medication and dose if known) carvedilol 6.25 mg bid  2. Which pharmacy/location (including street and city if local pharmacy) is medication to be sent to? [facility] in [place]  3. Do they need a 30 day or 90 day supply? 33  Stated the pharmacy did not receive the initial request

## 2019-07-03 DIAGNOSIS — N183 Chronic kidney disease, stage 3 (moderate): Secondary | ICD-10-CM | POA: Diagnosis not present

## 2019-07-03 DIAGNOSIS — I10 Essential (primary) hypertension: Secondary | ICD-10-CM | POA: Diagnosis not present

## 2019-07-03 DIAGNOSIS — D649 Anemia, unspecified: Secondary | ICD-10-CM | POA: Diagnosis not present

## 2019-07-30 ENCOUNTER — Encounter: Payer: Self-pay | Admitting: Thoracic Surgery (Cardiothoracic Vascular Surgery)

## 2019-08-01 ENCOUNTER — Encounter: Payer: Self-pay | Admitting: Internal Medicine

## 2019-08-01 ENCOUNTER — Ambulatory Visit: Payer: Medicare HMO | Admitting: Internal Medicine

## 2019-08-01 ENCOUNTER — Other Ambulatory Visit: Payer: Self-pay

## 2019-08-01 VITALS — BP 120/70 | HR 70 | Ht 67.0 in | Wt 174.5 lb

## 2019-08-01 DIAGNOSIS — N183 Chronic kidney disease, stage 3 unspecified: Secondary | ICD-10-CM

## 2019-08-01 DIAGNOSIS — I5032 Chronic diastolic (congestive) heart failure: Secondary | ICD-10-CM | POA: Diagnosis not present

## 2019-08-01 DIAGNOSIS — I10 Essential (primary) hypertension: Secondary | ICD-10-CM

## 2019-08-01 DIAGNOSIS — I471 Supraventricular tachycardia: Secondary | ICD-10-CM | POA: Diagnosis not present

## 2019-08-01 DIAGNOSIS — I2510 Atherosclerotic heart disease of native coronary artery without angina pectoris: Secondary | ICD-10-CM

## 2019-08-01 MED ORDER — AMLODIPINE BESYLATE 5 MG PO TABS: 5.0000 mg | ORAL_TABLET | Freq: Every day | ORAL | 1 refills | Status: DC

## 2019-08-01 MED ORDER — CARVEDILOL 12.5 MG PO TABS: 12.5000 mg | ORAL_TABLET | Freq: Two times a day (BID) | ORAL | 1 refills | Status: DC

## 2019-08-01 NOTE — Patient Instructions (Signed)
Medication Instructions:  Your physician has recommended you make the following change in your medication:  1- DECREASE Amlodipine to 5 mg by mouth once a day. 2- INCREASE Carvedilol to 12.5 mg by mouth two times a day.  If you need a refill on your cardiac medications before your next appointment, please call your pharmacy.   Lab work: Your physician recommends that you return for lab work in: [place] (bmet, Mg).  If you have labs (blood work) drawn today and your tests are completely normal, you will receive your results only by: [name] Kitchen MyChart Message (if you have MyChart) OR . A paper copy in the mail If you have any lab test that is abnormal or we need to change your treatment, we will call you to review the results.  Testing/Procedures: - None ordered.   Follow-Up: At [facility], you and your health needs are our priority.  As part of our continuing mission to provide you with exceptional heart care, we have created designated Provider Care Teams.  These Care Teams include your primary Cardiologist (physician) and Advanced Practice Providers (APPs -  Physician Assistants and Nurse Practitioners) who all work together to provide you with the care you need, when you need it. You will need a follow up appointment in 3 months.   You may see Nelva Bush, MD or one of the following Advanced Practice Providers on your designated Care Team:   Murray Hodgkins, NP Christell Faith, PA-C . Marrianne Mood, PA-C

## 2019-08-01 NOTE — Progress Notes (Signed)
Follow-up Outpatient Visit Date: 08/01/2019  Primary Care Provider: Remi Haggard, [place] [address]  Chief Complaint: Diaphoresis  HPI:  Rodney Wiley is a 83 y.o. year-old male with history of hypertension, coronary artery disease status post CABG (3/19) aortic regurgitation and thoracic aortic aneurysm status post Bentall with bioprosthetic aortic valve replacement (3/19), SVT, HFpEF, and chronic kidney disease stage 3with questionable left renal artery stenosis, who presents for follow-up of SVT, CAD, HFpEF and valvular heart disease.  He was last seen in our office by Christell Faith, PA, in February.  He has presented to the [facility] ED in January with palpitations and weakness.  He was found to be in SVT, which terminated with diltiazem.  He was subsequently switched from amlodipine to diltiazem.  At his follow-up visit in February, he was doing well without any further palpitations.  Event monitor, worn for approximately 3 days, showed sinus rhythm with rare PACs and frequent PVCs (8.4%).  19 atrial runs lasting up to 11 beats were noted.  Today, Rodney Wiley reports that he is feeling well.  However, earlier this month (07/21/2019), he had an episode of marked diaphoresis and generalized malaise.  He laid down on the couch for about 30 to 45 minutes with gradual improvement.  However, he continued to feel "not up to par" for the next 2 days.  The episode was reminiscent of what he experienced at the time of SVT in January, albeit less severe.  He denies having any chest pain, shortness of breath, palpitations, or lightheadedness.  He has continued to mow his lawn regularly without any difficulty.  He has chronic swelling of both legs, which he manages with as needed torsemide.  He takes this on average once every other week.  Blood pressure has been somewhat labile but overall under relatively good control.  He was switched from diltiazem back to amlodipine in March, which he  seems to be tolerating well.  --------------------------------------------------------------------------------------------------  Cardiovascular History & Procedures: Cardiovascular Problems:  Thoracic aortic aneurysm s/p Bentall  Aortic regurgitation s/p bioprosthetic AVR  Mitral regurgitation  Questionable renal artery stenosis  Coronary artery disease s/p CABG  Risk Factors:  Known CAD, hypertension, hyperlipidemia, male gender, and age > 26  Cath/PCI:  Coronary angiography/RHC (01/16/2018): LMCA normal. LAD with sequential 20% ostial, 40% proximal, and long 70% proximal to mid stenoses. LCx with 60% proximal stenosis and 60 to 80-90% OM2 and OM3 lesions. Chronic total occlusion of the proximal/mid RCA with left-to-right collaterals. RA 9, RV 43/10, PA 37/15 (22), and PCWP 18. Fick CO/CI 5.1/2.6.  CV Surgery:  CABG and Bentall with bioprosthetic AVR (02/22/2018): LIMA to LAD, SVG to OM2, and SVG to PDA. 24 mm synthetic aortic root graft and 21 mm Edwards InspirisRoselia stented bovine pericardial tissue valve.  EP Procedures and Devices:  Event monitor (01/22/19): Patient was monitored for 2 days, 22 hours.  Predominantly sinus rhythm with rare PAC's and frequent PVC's.  Several brief atrial runs were noted.  PVC burden 8.4%.  Non-Invasive Evaluation(s):  Renal artery Doppler (02/06/19): No evidence of significant renal artery stenosis affecting either kidney.  TTE (04/07/18): Normal LV size with mild LVH. LVEF 50 to 55% with anteroseptal and septal hypokinesis. Grade 2 diastolic dysfunction. Bioprosthetic aortic valve present with mean gradient of 17 mmHg. Mild mitral regurgitation. Normal RV size and function. No pericardial effusion.  TEE (09/27/17): Mildly dilated LV with LVEF 60-65%. Moderate aortic regurgitation. Mild to moderate mitral regurgitation. Mild right atrial  enlargement. Moderate aortic dilation, with root measuring 5.0 cm and ascending aorta 4.7  cm  MRA chest (06/06/17): Stable ascending aortic aneurysm, measuring 5.0 cm atthe sinotubular junction and 4.8 cm at the level of the main pulmonary artery.  Renal artery Doppler (01/13/17): Aortic atherosclerosis without stenosis or aneurysm. Normal right renal artery. Mild to moderate left renal artery stenosis (1-59%). Normal/symmetric kidney size.  MRA chest (12/27/16): Aneurysmal disease of the ascending aorta, measuring 4.3 cm at the sinus of Valsalva and 4.9 cm above the sinotubular junction.  TTE (12/14/16): Mildly dilated LV with mild LVH. Normal contraction with LVEF 55-60%. Grade 1 diastolic dysfunction. Mild to moderate aortic regurgitation. Dilated aorta, measuring up to 4.9 cm above the sinotubular junction. Mild to moderate mitral regurgitation. Mild left atrial enlargement. Normal RV size and function.  Transthoracic echo (09/2016, [facility]): Full report not available. Reportedly showed LVH with LVEF 69% and diastolic dysfunction. MR and moderate to severe AI present with sclerotic aortic valve.  Carotid Doppler (10/201&): Full report not available. Reportedly showed 1-39% stenosis bilaterally.  Transthoracic echo (02/18/16, [facility]): Normal LV and RV size and function. LVEF >55%. Moderate AI; mild MR and TR.  Recent CV Pertinent Labs: Lab Results  Component Value Date   CHOL 132 09/15/2018   HDL 33 (L) 09/15/2018   LDLCALC 56 09/15/2018   TRIG 214 (H) 09/15/2018   CHOLHDL 4.0 09/15/2018   CHOLHDL 3.8 03/11/2017   INR 2.9 05/24/2018   INR 7.53 (HH) 03/27/2018   K 4.2 05/18/2019   MG 2.0 01/22/2019   BUN 32 (H) 05/18/2019   BUN 21 01/22/2019   CREATININE 2.16 (H) 05/18/2019    Past medical and surgical history were reviewed and updated in EPIC.  Current Meds  Medication Sig  . amLODipine (NORVASC) 10 MG tablet Take 1 tablet (10 mg total) by mouth daily.  [name] Kitchen aspirin EC 81 MG tablet Take 81 mg by mouth daily.  [name] Kitchen atorvastatin (LIPITOR) 20  MG tablet TAKE ONE TABLET BY MOUTH DAILY  . carvedilol (COREG) 6.25 MG tablet Take 1 tablet (6.25 mg total) by mouth 2 (two) times daily.  . cholecalciferol (VITAMIN D3) 25 MCG (1000 UT) tablet Take 1,000 Units by mouth daily.  [name] Kitchen doxazosin (CARDURA) 2 MG tablet TAKE ONE TABLET BY MOUTH DAILY  . ferrous sulfate 325 (65 FE) MG EC tablet Take 325 mg by mouth daily with breakfast.   . hydrALAZINE (APRESOLINE) 25 MG tablet Take 2 tablets (50 mg total) by mouth 3 (three) times daily.  . isosorbide mononitrate (IMDUR) 30 MG 24 hr tablet Take 1 tablet (30 mg total) by mouth daily.  [name] Kitchen omeprazole (PRILOSEC) 20 MG capsule Take 20 mg by mouth daily.  [name] Kitchen torsemide (DEMADEX) 20 MG tablet Take 1 tablet (20 mg total) by mouth daily as needed.  . vitamin C (ASCORBIC ACID) 500 MG tablet Take 500 mg by mouth every evening.     Allergies: Patient has no known allergies.  Social History   Tobacco Use  . Smoking status: Never Smoker  . Smokeless tobacco: Never Used  Substance Use Topics  . Alcohol use: No  . Drug use: No    Family History  Problem Relation Age of Onset  . Hypertension Mother   . Stroke Mother   . Leukemia Father   . Heart attack Paternal Grandmother   . Stroke Paternal Grandfather   . Heart Problems Son 52  . Heart Problems Son 31  . Uterine cancer Sister   .  Prostate cancer Brother   . Colon cancer Brother   . Kidney failure Brother   . Stroke Sister     Review of Systems: A 12-system review of systems was performed and was negative except as noted in the HPI.  --------------------------------------------------------------------------------------------------  Physical Exam: BP 120/70 (BP Location: Left Arm, Patient Position: Sitting, Cuff Size: Normal)   Pulse 70   Ht 5\' 7"  (1.702 m)   Wt 174 lb 8 oz (79.2 kg)   BMI 27.33 kg/m   General: NAD. HEENT: No conjunctival pallor or scleral icterus.  Facemask in place. Neck: Supple without lymphadenopathy, thyromegaly, JVD,  or HJR. Lungs: Normal work of breathing. Clear to auscultation bilaterally without wheezes or crackles. Heart: Regular rate and rhythm with 2/6 systolic murmur.  No rubs or gallops.  Median sternotomy well-healed. Abd: Bowel sounds present. Soft, NT/ND without hepatosplenomegaly Ext: 2+ BLE edema. Skin: Warm and dry without rash.  EKG: Normal sinus rhythm with first-degree AV block and left anterior fascicular block.  Lab Results  Component Value Date   WBC 5.3 05/18/2019   HGB 10.2 (L) 05/18/2019   HCT 30.1 (L) 05/18/2019   MCV 94.1 05/18/2019   PLT 117 (L) 05/18/2019    Lab Results  Component Value Date   NA 140 05/18/2019   K 4.2 05/18/2019   CL 112 (H) 05/18/2019   CO2 19 (L) 05/18/2019   BUN 32 (H) 05/18/2019   CREATININE 2.16 (H) 05/18/2019   GLUCOSE 122 (H) 05/18/2019   ALT 16 05/18/2019    Lab Results  Component Value Date   CHOL 132 09/15/2018   HDL 33 (L) 09/15/2018   LDLCALC 56 09/15/2018   TRIG 214 (H) 09/15/2018   CHOLHDL 4.0 09/15/2018    --------------------------------------------------------------------------------------------------  ASSESSMENT AND PLAN: Coronary artery disease without angina: No symptoms suggest worsening coronary insufficiency.  Continue current medications for secondary prevention.  PSVT: I wonder if recent episode of diaphoresis and generalized malaise may represent recurrent SVT.  EKG today shows sinus rhythm with first-degree AV block and LAFB.  Event monitor earlier this year was notable for multiple brief atrial runs but no sustained arrhythmia.  I have recommended increasing carvedilol to 12.5 mg twice daily; we will need to continue to watch Mr. Kloos's PR interval closely, given underlying first-degree AV block.  If he has recurrent symptoms or worsening first-degree AV block, we may need to send him to EP for further assessment.  I will check a BMP and magnesium level today.  Of note, TSH was normal in January.  Chronic  HFpEF: Chronic lower extremity edema and weight are stable.  I wonder if some of the edema may also be related to amlodipine.  Given good blood pressure control today and escalation of carvedilol, we will decrease amlodipine to 5 mg daily.  Hypertension: Blood pressure well controlled today.  As above, we will increase carvedilol 12.5 mg twice daily and decrease amlodipine to 5 mg daily.  Thoracic aortic aneurysm status post Bentall and bioprosthetic AVR: Patient stable, having recovered well from surgery last year.  Continue aspirin and SBE prophylaxis.  Chronic kidney disease stage III: Patient is clinically stable and continues to follow with [facility] nephrology.  I will repeat a BMP today, given recent episode suspicious for PSVT.  We will continue to avoid nephrotoxic drugs.  Follow-up: Return to clinic in 3 months, sooner if symptoms recur.  Nelva Bush, MD 08/01/2019 1:49 PM

## 2019-08-02 LAB — MAGNESIUM: Magnesium: 2.2 mg/dL (ref 1.6–2.3)

## 2019-08-02 LAB — BASIC METABOLIC PANEL
BUN/Creatinine Ratio: 15 (ref 10–24)
BUN: 32 mg/dL — ABNORMAL HIGH (ref 8–27)
CO2: 20 mmol/L (ref 20–29)
Calcium: 8.6 mg/dL (ref 8.6–10.2)
Chloride: 105 mmol/L (ref 96–106)
Creatinine, Ser: 2.07 mg/dL — ABNORMAL HIGH (ref 0.76–1.27)
GFR calc Af Amer: 34 mL/min/{1.73_m2} — ABNORMAL LOW (ref >59)
GFR calc non Af Amer: 29 mL/min/{1.73_m2} — ABNORMAL LOW (ref >59)
Glucose: 88 mg/dL (ref 65–99)
Potassium: 4.5 mmol/L (ref 3.5–5.2)
Sodium: 139 mmol/L (ref 134–144)

## 2019-08-06 ENCOUNTER — Encounter: Payer: Medicare HMO | Admitting: Thoracic Surgery (Cardiothoracic Vascular Surgery)

## 2019-08-16 DIAGNOSIS — X32XXXA Exposure to sunlight, initial encounter: Secondary | ICD-10-CM | POA: Diagnosis not present

## 2019-08-16 DIAGNOSIS — D2262 Melanocytic nevi of left upper limb, including shoulder: Secondary | ICD-10-CM | POA: Diagnosis not present

## 2019-08-16 DIAGNOSIS — L821 Other seborrheic keratosis: Secondary | ICD-10-CM | POA: Diagnosis not present

## 2019-08-16 DIAGNOSIS — D225 Melanocytic nevi of trunk: Secondary | ICD-10-CM | POA: Diagnosis not present

## 2019-08-16 DIAGNOSIS — D2261 Melanocytic nevi of right upper limb, including shoulder: Secondary | ICD-10-CM | POA: Diagnosis not present

## 2019-08-16 DIAGNOSIS — L570 Actinic keratosis: Secondary | ICD-10-CM | POA: Diagnosis not present

## 2019-08-16 DIAGNOSIS — D2272 Melanocytic nevi of left lower limb, including hip: Secondary | ICD-10-CM | POA: Diagnosis not present

## 2019-08-16 DIAGNOSIS — D2271 Melanocytic nevi of right lower limb, including hip: Secondary | ICD-10-CM | POA: Diagnosis not present

## 2019-09-11 DIAGNOSIS — M5136 Other intervertebral disc degeneration, lumbar region: Secondary | ICD-10-CM | POA: Diagnosis not present

## 2019-09-11 DIAGNOSIS — M5440 Lumbago with sciatica, unspecified side: Secondary | ICD-10-CM | POA: Diagnosis not present

## 2019-09-11 DIAGNOSIS — E538 Deficiency of other specified B group vitamins: Secondary | ICD-10-CM | POA: Diagnosis not present

## 2019-09-11 DIAGNOSIS — Z23 Encounter for immunization: Secondary | ICD-10-CM | POA: Diagnosis not present

## 2019-09-11 DIAGNOSIS — M13859 Other specified arthritis, unspecified hip: Secondary | ICD-10-CM | POA: Diagnosis not present

## 2019-09-11 DIAGNOSIS — M25552 Pain in left hip: Secondary | ICD-10-CM | POA: Diagnosis not present

## 2019-09-12 ENCOUNTER — Other Ambulatory Visit: Payer: Self-pay | Admitting: Internal Medicine

## 2019-09-12 DIAGNOSIS — M79661 Pain in right lower leg: Secondary | ICD-10-CM | POA: Diagnosis not present

## 2019-09-12 DIAGNOSIS — G603 Idiopathic progressive neuropathy: Secondary | ICD-10-CM | POA: Diagnosis not present

## 2019-09-12 DIAGNOSIS — G608 Other hereditary and idiopathic neuropathies: Secondary | ICD-10-CM | POA: Diagnosis not present

## 2019-09-28 ENCOUNTER — Telehealth: Payer: Self-pay

## 2019-09-28 ENCOUNTER — Other Ambulatory Visit: Payer: Self-pay | Admitting: Internal Medicine

## 2019-09-28 ENCOUNTER — Other Ambulatory Visit: Payer: Self-pay

## 2019-09-28 NOTE — Telephone Encounter (Signed)
Contacted the patient after receiving several mychart messages inquiring why his medication refill was denied. Patient sts that he does not have the name of his medications with him. He know that it is 2mg  tablets.  Reviewing the patients medication list adv him that I think he ma be referring to Cardura 2mg . Adv the patient that the refill was auth today and sent to [facility]. Patient voiced appreciation for the call. Nothing further needed.

## 2019-10-18 DIAGNOSIS — D509 Iron deficiency anemia, unspecified: Secondary | ICD-10-CM | POA: Diagnosis not present

## 2019-10-18 DIAGNOSIS — I340 Nonrheumatic mitral (valve) insufficiency: Secondary | ICD-10-CM | POA: Diagnosis not present

## 2019-10-18 DIAGNOSIS — I5032 Chronic diastolic (congestive) heart failure: Secondary | ICD-10-CM | POA: Diagnosis not present

## 2019-10-18 DIAGNOSIS — I2510 Atherosclerotic heart disease of native coronary artery without angina pectoris: Secondary | ICD-10-CM | POA: Diagnosis not present

## 2019-10-18 DIAGNOSIS — D649 Anemia, unspecified: Secondary | ICD-10-CM | POA: Diagnosis not present

## 2019-10-18 DIAGNOSIS — D519 Vitamin B12 deficiency anemia, unspecified: Secondary | ICD-10-CM | POA: Diagnosis not present

## 2019-10-18 DIAGNOSIS — I350 Nonrheumatic aortic (valve) stenosis: Secondary | ICD-10-CM | POA: Diagnosis not present

## 2019-10-18 DIAGNOSIS — N400 Enlarged prostate without lower urinary tract symptoms: Secondary | ICD-10-CM | POA: Diagnosis not present

## 2019-10-18 DIAGNOSIS — E785 Hyperlipidemia, unspecified: Secondary | ICD-10-CM | POA: Diagnosis not present

## 2019-10-18 DIAGNOSIS — E559 Vitamin D deficiency, unspecified: Secondary | ICD-10-CM | POA: Diagnosis not present

## 2019-10-18 DIAGNOSIS — E7800 Pure hypercholesterolemia, unspecified: Secondary | ICD-10-CM | POA: Diagnosis not present

## 2019-10-18 DIAGNOSIS — I38 Endocarditis, valve unspecified: Secondary | ICD-10-CM | POA: Diagnosis not present

## 2019-10-18 DIAGNOSIS — I351 Nonrheumatic aortic (valve) insufficiency: Secondary | ICD-10-CM | POA: Diagnosis not present

## 2019-10-18 DIAGNOSIS — I361 Nonrheumatic tricuspid (valve) insufficiency: Secondary | ICD-10-CM | POA: Diagnosis not present

## 2019-10-18 DIAGNOSIS — R7989 Other specified abnormal findings of blood chemistry: Secondary | ICD-10-CM | POA: Diagnosis not present

## 2019-10-18 DIAGNOSIS — E538 Deficiency of other specified B group vitamins: Secondary | ICD-10-CM | POA: Diagnosis not present

## 2019-10-18 DIAGNOSIS — R972 Elevated prostate specific antigen [PSA]: Secondary | ICD-10-CM | POA: Diagnosis not present

## 2019-10-18 DIAGNOSIS — I10 Essential (primary) hypertension: Secondary | ICD-10-CM | POA: Diagnosis not present

## 2019-10-18 DIAGNOSIS — Z8249 Family history of ischemic heart disease and other diseases of the circulatory system: Secondary | ICD-10-CM | POA: Diagnosis not present

## 2019-11-07 ENCOUNTER — Other Ambulatory Visit: Payer: Self-pay

## 2019-11-07 ENCOUNTER — Ambulatory Visit (INDEPENDENT_AMBULATORY_CARE_PROVIDER_SITE_OTHER): Payer: Medicare HMO | Admitting: Internal Medicine

## 2019-11-07 ENCOUNTER — Encounter: Payer: Self-pay | Admitting: Internal Medicine

## 2019-11-07 VITALS — BP 120/70 | HR 58 | Ht 67.0 in | Wt 178.0 lb

## 2019-11-07 DIAGNOSIS — I5032 Chronic diastolic (congestive) heart failure: Secondary | ICD-10-CM

## 2019-11-07 DIAGNOSIS — I2510 Atherosclerotic heart disease of native coronary artery without angina pectoris: Secondary | ICD-10-CM

## 2019-11-07 DIAGNOSIS — N183 Chronic kidney disease, stage 3 unspecified: Secondary | ICD-10-CM

## 2019-11-07 DIAGNOSIS — E785 Hyperlipidemia, unspecified: Secondary | ICD-10-CM | POA: Diagnosis not present

## 2019-11-07 DIAGNOSIS — I471 Supraventricular tachycardia: Secondary | ICD-10-CM | POA: Diagnosis not present

## 2019-11-07 DIAGNOSIS — I10 Essential (primary) hypertension: Secondary | ICD-10-CM | POA: Diagnosis not present

## 2019-11-07 NOTE — Progress Notes (Signed)
Follow-up Outpatient Visit Date: 11/07/2019  Primary Care Provider: Remi Haggard, [place] [address]  Chief Complaint: Follow-up CAD, aortic valve disease, and aortic aneurysm  HPI:  Rodney Wiley is a 83 y.o. male with history of hypertension,coronary artery disease status post CABG (3/19)aortic regurgitationandthoracic aortic aneurysmstatus post Bentall with bioprosthetic aortic valve replacement (3/19), SVT, HFpEF, and chronic kidney disease stage 3with questionable left renal artery stenosis, who presents for follow-up of SVT, CAD, HFpEF and valvular heart disease, who presents for follow-up of coronary disease, aortic/aortic valve disease, chronic HFpEF, and SVT.  I last saw Rodney Wiley in August, at which time he was doing well other than an episode of significant diaphoresis and generalized malaise in mid August that was reminiscent of what he felt at the time of SVT.  He agreed to increase carvedilol to 12.5 mg twice daily and decrease amlodipine to 5 mg daily.  Today, Rodney Wiley reports feeling well.  He reports occasional mild discomfort in the left chest.  The pain is linear, running parallel to his median sternotomy incision.  The pain is not exertional and comes and goes randomly, usually lasting 1-2 days at a time.  He describes it as a "drawing feeling."  He denies shortness of breath, palpitations, and lightheadedness.  Chronic leg swelling has been stable.  His home blood pressures have been similar to today's readings.  He uses torsemide intermittently if his leg swelling worsens.  His weight has been stable.  He has not experienced any further episodes of diaphoresis and generalized malaise like what he felt with SVT earlier this year.  --------------------------------------------------------------------------------------------------  Cardiovascular History & Procedures: Cardiovascular Problems:  Thoracic aortic aneurysms/p Bentall   Aortic regurgitations/p bioprosthetic AVR  Mitral regurgitation  Questionable renal artery stenosis  Coronary artery disease s/p CABG  Supraventricular tachycardia  Risk Factors:  Known CAD, hypertension, hyperlipidemia, male gender, and age > 89  Cath/PCI:  Coronary angiography/RHC (01/16/2018): LMCA normal. LAD with sequential 20% ostial, 40% proximal, and long 70% proximal to mid stenoses. LCx with 60% proximal stenosis and 60 to 80-90% OM2 and OM3 lesions. Chronic total occlusion of the proximal/mid RCA with left-to-right collaterals. RA 9, RV 43/10, PA 37/15 (22), and PCWP 18. Fick CO/CI 5.1/2.6.  CV Surgery:  CABG and Bentall with bioprosthetic AVR (02/22/2018): LIMA to LAD, SVG to OM2, and SVG to PDA. 24 mm synthetic aortic root graft and 21 mm Edwards InspirisRoselia stented bovine pericardial tissue valve.  EP Procedures and Devices:  Event monitor (01/22/19): Patient was monitored for 2 days, 22 hours.  Predominantly sinus rhythm with rare PAC's and frequent PVC's. Several brief atrial runs were noted.  PVC burden 8.4%.  Non-Invasive Evaluation(s):  Renal artery Doppler (02/06/19): No evidence of significant renal artery stenosis affecting either kidney.  TTE (04/07/18): Normal LV size with mild LVH. LVEF 50 to 55% with anteroseptal and septal hypokinesis. Grade 2 diastolic dysfunction. Bioprosthetic aortic valve present with mean gradient of 17 mmHg. Mild mitral regurgitation. Normal RV size and function. No pericardial effusion.  TEE (09/27/17): Mildly dilated LV with LVEF 60-65%. Moderate aortic regurgitation. Mild to moderate mitral regurgitation. Mild right atrial enlargement. Moderate aortic dilation, with root measuring 5.0 cm and ascending aorta 4.7 cm  MRA chest (06/06/17): Stable ascending aortic aneurysm, measuring 5.0 cm atthe sinotubular junction and 4.8 cm at the level of the main pulmonary artery.  Renal artery Doppler (01/13/17): Aortic  atherosclerosis without stenosis or aneurysm. Normal right renal artery. Mild to  moderate left renal artery stenosis (1-59%). Normal/symmetric kidney size.  MRA chest (12/27/16): Aneurysmal disease of the ascending aorta, measuring 4.3 cm at the sinus of Valsalva and 4.9 cm above the sinotubular junction.  TTE (12/14/16): Mildly dilated LV with mild LVH. Normal contraction with LVEF 55-60%. Grade 1 diastolic dysfunction. Mild to moderate aortic regurgitation. Dilated aorta, measuring up to 4.9 cm above the sinotubular junction. Mild to moderate mitral regurgitation. Mild left atrial enlargement. Normal RV size and function.  Transthoracic echo (09/2016, [facility]): Full report not available. Reportedly showed LVH with LVEF 97% and diastolic dysfunction. MR and moderate to severe AI present with sclerotic aortic valve.  Carotid Doppler (10/201&): Full report not available. Reportedly showed 1-39% stenosis bilaterally.  Transthoracic echo (02/18/16, [facility]): Normal LV and RV size and function. LVEF >55%. Moderate AI; mild MR and TR.  Recent CV Pertinent Labs: Lab Results  Component Value Date   CHOL 132 09/15/2018   HDL 33 (L) 09/15/2018   LDLCALC 56 09/15/2018   TRIG 214 (H) 09/15/2018   CHOLHDL 4.0 09/15/2018   CHOLHDL 3.8 03/11/2017   INR 2.9 05/24/2018   INR 7.53 (HH) 03/27/2018   K 4.5 08/01/2019   MG 2.2 08/01/2019   BUN 32 (H) 08/01/2019   CREATININE 2.07 (H) 08/01/2019    Past medical and surgical history were reviewed and updated in EPIC.  Current Meds  Medication Sig  . amLODipine (NORVASC) 5 MG tablet Take 1 tablet (5 mg total) by mouth daily.  [name] Kitchen aspirin EC 81 MG tablet Take 81 mg by mouth daily.  [name] Kitchen atorvastatin (LIPITOR) 20 MG tablet TAKE ONE TABLET BY MOUTH DAILY  . carvedilol (COREG) 12.5 MG tablet Take 1 tablet (12.5 mg total) by mouth 2 (two) times daily.  . cholecalciferol (VITAMIN D3) 25 MCG (1000 UT) tablet Take 1,000 Units by mouth  daily.  [name] Kitchen doxazosin (CARDURA) 2 MG tablet TAKE ONE TABLET BY MOUTH DAILY  . ferrous sulfate 325 (65 FE) MG EC tablet Take 325 mg by mouth daily with breakfast.   . hydrALAZINE (APRESOLINE) 25 MG tablet Take 2 tablets (50 mg total) by mouth 3 (three) times daily.  . isosorbide mononitrate (IMDUR) 30 MG 24 hr tablet Take 1 tablet (30 mg total) by mouth daily.  [name] Kitchen omeprazole (PRILOSEC) 20 MG capsule Take 20 mg by mouth daily.  [name] Kitchen torsemide (DEMADEX) 20 MG tablet Take 1 tablet (20 mg total) by mouth daily as needed.  . vitamin C (ASCORBIC ACID) 500 MG tablet Take 500 mg by mouth every evening.     Allergies: Patient has no known allergies.  Social History   Tobacco Use  . Smoking status: Never Smoker  . Smokeless tobacco: Never Used  Substance Use Topics  . Alcohol use: No  . Drug use: No    Family History  Problem Relation Age of Onset  . Hypertension Mother   . Stroke Mother   . Leukemia Father   . Heart attack Paternal Grandmother   . Stroke Paternal Grandfather   . Heart Problems Son 62  . Heart Problems Son 7  . Uterine cancer Sister   . Prostate cancer Brother   . Colon cancer Brother   . Kidney failure Brother   . Stroke Sister     Review of Systems: A 12-system review of systems was performed and was negative except as noted in the HPI.  --------------------------------------------------------------------------------------------------  Physical Exam: BP 120/70 (BP Location: Left Arm, Patient Position: Sitting, Cuff Size: Normal)  Pulse (!) 58   Ht 5\' 7"  (1.702 m)   Wt 178 lb (80.7 kg)   SpO2 98%   BMI 27.88 kg/m   General:  NAD. HEENT: No conjunctival pallor or scleral icterus. Facemask in place. Neck: Supple without lymphadenopathy, thyromegaly, JVD, or HJR. Lungs: Normal work of breathing. Clear to auscultation bilaterally without wheezes or crackles. Heart: Bradycardic but regular with occasional extrasystoles.  2/6 systolic murmur noted. Abd: Bowel  sounds present. Soft, NT/ND without hepatosplenomegaly Ext: 1-2+ pretibial edema. Skin: Warm and dry without rash.  EKG:  Sinus bradycardia with 1st degree AV block (PR 290), frequent PVC's and LAFB.  PR interval has increased since 07/2019 but is similar to tracing from 01/22/2019.  Lab Results  Component Value Date   WBC 5.3 05/18/2019   HGB 10.2 (L) 05/18/2019   HCT 30.1 (L) 05/18/2019   MCV 94.1 05/18/2019   PLT 117 (L) 05/18/2019    Lab Results  Component Value Date   NA 139 08/01/2019   K 4.5 08/01/2019   CL 105 08/01/2019   CO2 20 08/01/2019   BUN 32 (H) 08/01/2019   CREATININE 2.07 (H) 08/01/2019   GLUCOSE 88 08/01/2019   ALT 16 05/18/2019    Lab Results  Component Value Date   CHOL 132 09/15/2018   HDL 33 (L) 09/15/2018   LDLCALC 56 09/15/2018   TRIG 214 (H) 09/15/2018   CHOLHDL 4.0 09/15/2018    --------------------------------------------------------------------------------------------------  ASSESSMENT AND PLAN: CAD: No angina reported.  Atypical left-sided chest pain is most likely musculoskeletal or related to prior median sternotomy.  We will defer ischemia evaluation unless symptoms worsen.  Continue Aspirin, isosorbide mononitrate, carvedilol, and atorvastatin.  Chronic HFpEF: Mr. Brentlinger appears mildly volume overloaded today.  I encouraged him to take his as needed torsemide for the next few days until his weight and edema return to baseline.  Thoracic aortic aneurysm s/p Bentall: Doing well; continue BP control.  Hypertension: BP well-controlled today.  Continue current medications and sodium restriction.  PSVT: No symptoms to suggest recurrence.  We will continue carvedilol 12.5 mg BID with close monitoring of his 1st degree AV block.  Hyperlipidemia: Continue atorvastatin 20 mg daily; LDL at goal on recent labs by Ms. Lavena Bullion.  Chronic kidney disease: Stable renal function on recent outside labs.  Continue current medications and follow-up  with nephrology.  Follow-up: Return to clinic in 6 months.  Nelva Bush, MD 11/07/2019 3:26 PM

## 2019-11-07 NOTE — Patient Instructions (Signed)
Medication Instructions:  Your physician recommends that you continue on your current medications as directed. Please refer to the Current Medication list given to you today.  Take your as needed Torsemide daily until your weight and swelling return to baseline.   *If you need a refill on your cardiac medications before your next appointment, please call your pharmacy*  Lab Work: none If you have labs (blood work) drawn today and your tests are completely normal, you will receive your results only by: [name] Kitchen MyChart Message (if you have MyChart) OR . A paper copy in the mail If you have any lab test that is abnormal or we need to change your treatment, we will call you to review the results.  Testing/Procedures: none  Follow-Up: At [facility], you and your health needs are our priority.  As part of our continuing mission to provide you with exceptional heart care, we have created designated Provider Care Teams.  These Care Teams include your primary Cardiologist (physician) and Advanced Practice Providers (APPs -  Physician Assistants and Nurse Practitioners) who all work together to provide you with the care you need, when you need it.  Your next appointment:   6 month(s)  The format for your next appointment:   In Person  Provider:    You may see Nelva Bush, MD or one of the following Advanced Practice Providers on your designated Care Team:    Murray Hodgkins, NP  Christell Faith, PA-C  Marrianne Mood, PA-C

## 2019-11-08 ENCOUNTER — Encounter: Payer: Self-pay | Admitting: Internal Medicine

## 2019-11-14 DIAGNOSIS — R011 Cardiac murmur, unspecified: Secondary | ICD-10-CM | POA: Diagnosis not present

## 2019-11-14 DIAGNOSIS — I2510 Atherosclerotic heart disease of native coronary artery without angina pectoris: Secondary | ICD-10-CM | POA: Diagnosis not present

## 2019-11-14 DIAGNOSIS — E559 Vitamin D deficiency, unspecified: Secondary | ICD-10-CM | POA: Diagnosis not present

## 2019-11-14 DIAGNOSIS — Z1389 Encounter for screening for other disorder: Secondary | ICD-10-CM | POA: Diagnosis not present

## 2019-11-14 DIAGNOSIS — K219 Gastro-esophageal reflux disease without esophagitis: Secondary | ICD-10-CM | POA: Diagnosis not present

## 2019-11-14 DIAGNOSIS — D649 Anemia, unspecified: Secondary | ICD-10-CM | POA: Diagnosis not present

## 2019-11-14 DIAGNOSIS — I5032 Chronic diastolic (congestive) heart failure: Secondary | ICD-10-CM | POA: Diagnosis not present

## 2019-11-14 DIAGNOSIS — I499 Cardiac arrhythmia, unspecified: Secondary | ICD-10-CM | POA: Diagnosis not present

## 2019-11-14 DIAGNOSIS — Z0000 Encounter for general adult medical examination without abnormal findings: Secondary | ICD-10-CM | POA: Diagnosis not present

## 2019-11-14 DIAGNOSIS — I10 Essential (primary) hypertension: Secondary | ICD-10-CM | POA: Diagnosis not present

## 2019-11-14 DIAGNOSIS — E7800 Pure hypercholesterolemia, unspecified: Secondary | ICD-10-CM | POA: Diagnosis not present

## 2019-11-14 DIAGNOSIS — I38 Endocarditis, valve unspecified: Secondary | ICD-10-CM | POA: Diagnosis not present

## 2019-11-14 DIAGNOSIS — I701 Atherosclerosis of renal artery: Secondary | ICD-10-CM | POA: Diagnosis not present

## 2019-11-18 DIAGNOSIS — D6869 Other thrombophilia: Secondary | ICD-10-CM | POA: Diagnosis not present

## 2019-11-18 DIAGNOSIS — I2510 Atherosclerotic heart disease of native coronary artery without angina pectoris: Secondary | ICD-10-CM | POA: Diagnosis not present

## 2019-11-18 DIAGNOSIS — E785 Hyperlipidemia, unspecified: Secondary | ICD-10-CM | POA: Diagnosis not present

## 2019-11-18 DIAGNOSIS — Z7982 Long term (current) use of aspirin: Secondary | ICD-10-CM | POA: Diagnosis not present

## 2019-11-18 DIAGNOSIS — I509 Heart failure, unspecified: Secondary | ICD-10-CM | POA: Diagnosis not present

## 2019-11-18 DIAGNOSIS — Z809 Family history of malignant neoplasm, unspecified: Secondary | ICD-10-CM | POA: Diagnosis not present

## 2019-11-18 DIAGNOSIS — M1990 Unspecified osteoarthritis, unspecified site: Secondary | ICD-10-CM | POA: Diagnosis not present

## 2019-11-18 DIAGNOSIS — I4891 Unspecified atrial fibrillation: Secondary | ICD-10-CM | POA: Diagnosis not present

## 2019-11-18 DIAGNOSIS — I110 Hypertensive heart disease with heart failure: Secondary | ICD-10-CM | POA: Diagnosis not present

## 2019-11-18 DIAGNOSIS — K219 Gastro-esophageal reflux disease without esophagitis: Secondary | ICD-10-CM | POA: Diagnosis not present

## 2019-11-19 ENCOUNTER — Encounter: Payer: Self-pay | Admitting: Oncology

## 2019-11-20 ENCOUNTER — Other Ambulatory Visit: Payer: Self-pay

## 2019-11-20 ENCOUNTER — Inpatient Hospital Stay: Payer: Medicare HMO

## 2019-11-20 ENCOUNTER — Other Ambulatory Visit: Payer: Medicare HMO

## 2019-11-20 ENCOUNTER — Encounter: Payer: Self-pay | Admitting: Oncology

## 2019-11-20 ENCOUNTER — Inpatient Hospital Stay: Payer: Medicare HMO | Attending: Oncology | Admitting: Oncology

## 2019-11-20 DIAGNOSIS — D631 Anemia in chronic kidney disease: Secondary | ICD-10-CM

## 2019-11-20 DIAGNOSIS — N189 Chronic kidney disease, unspecified: Secondary | ICD-10-CM

## 2019-11-20 DIAGNOSIS — N183 Chronic kidney disease, stage 3 unspecified: Secondary | ICD-10-CM | POA: Insufficient documentation

## 2019-11-20 LAB — CBC WITH DIFFERENTIAL/PLATELET
Abs Immature Granulocytes: 0.02 10*3/uL (ref 0.00–0.07)
Basophils Absolute: 0 10*3/uL (ref 0.0–0.1)
Basophils Relative: 0 %
Eosinophils Absolute: 0.2 10*3/uL (ref 0.0–0.5)
Eosinophils Relative: 4 %
HCT: 29.8 % — ABNORMAL LOW (ref 39.0–52.0)
Hemoglobin: 9.8 g/dL — ABNORMAL LOW (ref 13.0–17.0)
Immature Granulocytes: 0 %
Lymphocytes Relative: 20 %
Lymphs Abs: 1.1 10*3/uL (ref 0.7–4.0)
MCH: 32.5 pg (ref 26.0–34.0)
MCHC: 32.9 g/dL (ref 30.0–36.0)
MCV: 98.7 fL (ref 80.0–100.0)
Monocytes Absolute: 0.5 10*3/uL (ref 0.1–1.0)
Monocytes Relative: 10 %
Neutro Abs: 3.7 10*3/uL (ref 1.7–7.7)
Neutrophils Relative %: 66 %
Platelets: 124 10*3/uL — ABNORMAL LOW (ref 150–400)
RBC: 3.02 MIL/uL — ABNORMAL LOW (ref 4.22–5.81)
RDW: 12.9 % (ref 11.5–15.5)
WBC: 5.6 10*3/uL (ref 4.0–10.5)
nRBC: 0 % (ref 0.0–0.2)

## 2019-11-20 LAB — IRON AND TIBC
Iron: 77 ug/dL (ref 45–182)
Saturation Ratios: 31 % (ref 17.9–39.5)
TIBC: 251 ug/dL (ref 250–450)
UIBC: 174 ug/dL

## 2019-11-20 LAB — FERRITIN: Ferritin: 103 ng/mL (ref 24–336)

## 2019-11-20 NOTE — Progress Notes (Signed)
Pt doing ok but his daughter in law looks at his labs and the hgb is lower

## 2019-11-21 ENCOUNTER — Encounter: Payer: Self-pay | Admitting: *Deleted

## 2019-11-26 NOTE — Progress Notes (Signed)
I connected with Rodney Wiley on 11/26/19 at  1:15 PM EST by video enabled telemedicine visit and verified that I am speaking with the correct person using two identifiers.   I discussed the limitations, risks, security and privacy concerns of performing an evaluation and management service by telemedicine and the availability of in-person appointments. I also discussed with the patient that there may be a patient responsible charge related to this service. The patient expressed understanding and agreed to proceed.  Other persons participating in the visit and their role in the encounter:  none  Patient's location:  home Provider's location:  work   Diagnosis- anemia likely due to CKD Mild thrombocytopenia   Chief Complaint:  Routine f/u of anemia  History of present illness:  patient is a 83 year old male with a past medical history significant for hypertension, hyperlipidemia, stage III CKD and history of aortic insufficiency as well as ascending aortic aneurysm. He recently underwent bioprosthetic aortic valve replacement, CABG x3 and resuspension of the left atrium on 02/22/2018.   Results of blood work from 03/24/2018 were as follows: CBC showed white count of 7.7, H&H 11.4/34.1 with an MCV of 93.9 and a platelet count of 134. CMP showed elevated creatinine of 2.4. AST and ALT were normal. Folate and B12 are normal. Ferritin and iron studies were normal. Haptoglobin and TSH was normal. Multiple myeloma panel revealed no monoclonal protein.   Interval history: Patient is doing well and denies any complaints at this this time. He has chronic fatigue but otherwise doing well   Review of Systems  Constitutional: Positive for malaise/fatigue. Negative for chills, fever and weight loss.  HENT: Negative for congestion, ear discharge and nosebleeds.   Eyes: Negative for blurred vision.  Respiratory: Negative for cough, hemoptysis, sputum production, shortness of breath and wheezing.    Cardiovascular: Negative for chest pain, palpitations, orthopnea and claudication.  Gastrointestinal: Negative for abdominal pain, blood in stool, constipation, diarrhea, heartburn, melena, nausea and vomiting.  Genitourinary: Negative for dysuria, flank pain, frequency, hematuria and urgency.  Musculoskeletal: Negative for back pain, joint pain and myalgias.  Skin: Negative for rash.  Neurological: Negative for dizziness, tingling, focal weakness, seizures, weakness and headaches.  Endo/Heme/Allergies: Does not bruise/bleed easily.  Psychiatric/Behavioral: Negative for depression and suicidal ideas. The patient does not have insomnia.     No Known Allergies  Past Medical History:  Diagnosis Date  . Anemia   . Aortic insufficiency    a. 02/2018 s/p bioprosthetic AVR; 04/2018 Echo: EF 50-55%, Gr2 DD, Ao bioprosthesis, mean grad 85mHg, Ao root/Asc Ao nl in size, mild MR, mildly dil LA, nl RV fxn. Nl PASP.  .[name] KitchenArthropathy   . Ascending aortic aneurysm (HEbro    a. 12/2016 MRA: 4.3cm @ sinus of valsalva, 4.9cm above sinotubular jxn; b. 02/2018 s/p biological Bentall and AVR.  .[name] KitchenBPH (benign prostatic hyperplasia)   . 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 02/2018 Cath: LAD 20ost, 40p, 722mLCX 60p, OM2 60, OM3 85, RCA 10p/m w/ L->R and R->R collats;  b. 02/2018 CABG x 3: LIMA to LAD, SVG to OM2, SVG to PDA, EVH via right thigh.  . CKD (chronic kidney disease), stage III   . Diverticulitis   . Essential hypertension   . GERD (gastroesophageal reflux disease)   . Hemorrhoids   . History of chicken pox   . History of Helicobacter pylori infection 06/2007  . Hyperlipidemia   . Hypertension   . Left renal artery stenosis (HCC)  a. 01/2017 RA u/s: moderate L RAS.  [name] Kitchen Lower extremity edema   . Nonrheumatic mitral (valve) insufficiency    a. 12/2016 Echo: mild to mod MR; b. 09/2017 TEE: mild to mod MR; c. 04/2018 Echo: Mild MR.  [name] Kitchen Nonrheumatic pulmonary valve insufficiency   . S/P biological Bentall  aortic root replacement with bioprosthetic valve and synthetic root conduit 02/22/2018   a. s/p 21 mm Edwards Inspiris Resilia stented bovine pericardial tissue valve and 24 mm Gelweave Valsalva synthetic root conduit with reimplantation of left main coronary artery.    Past Surgical History:  Procedure Laterality Date  . AORTIC VALVE REPAIR N/A 02/22/2018   Procedure: AORTIC VALVE REPLACEMENT -Biological Bentall aortic root replacement,;  Surgeon: Rexene Alberts, MD;  Location: [facility];  Service: Open Heart Surgery;  Laterality: N/A;  . bypass surgery    . CARDIAC CATHETERIZATION     01/16/2018  . COLONOSCOPY  2011   by Dr. Candace Cruise with findings of diverticulosis  . CORONARY ARTERY BYPASS GRAFT N/A 02/22/2018   Procedure: CORONARY ARTERY BYPASS GRAFTING (CABG) x three, using left internal mammary artery and right leg greater saphenous vein harvested endoscopically;  Surgeon: Rexene Alberts, MD;  Location: [facility];  Service: Open Heart Surgery;  Laterality: N/A;  . CORONARY/GRAFT ANGIOGRAPHY N/A 01/16/2018   Procedure: CORONARY/GRAFT ANGIOGRAPHY;  Surgeon: Nelva Bush, MD;  Location: [facility];  Service: Cardiovascular;  Laterality: N/A;  . ELBOW SURGERY Left   . hemorhoidectomy    . RIGHT HEART CATH N/A 01/16/2018   Procedure: RIGHT HEART CATH;  Surgeon: Nelva Bush, MD;  Location: [place] [facility];  Service: Cardiovascular;  Laterality: N/A;  . SHOULDER ARTHROSCOPY WITH ROTATOR CUFF REPAIR Right   . SHOULDER SURGERY Right   . TEE WITHOUT CARDIOVERSION N/A 09/27/2017   Procedure: TRANSESOPHAGEAL ECHOCARDIOGRAM (TEE);  Surgeon: Dorothy Spark, MD;  Location: [facility] ENDOSCOPY;  Service: Cardiovascular;  Laterality: N/A;  . TEE WITHOUT CARDIOVERSION N/A 02/22/2018   Procedure: TRANSESOPHAGEAL ECHOCARDIOGRAM (TEE);  Surgeon: Rexene Alberts, MD;  Location: [facility];  Service: Open Heart Surgery;  Laterality: N/A;  . THORACIC AORTIC ANEURYSM REPAIR N/A 02/22/2018   Procedure: THORACIC  ASCENDING ANEURYSM REPAIR (AAA) Resection of ascending aorta aneurysm;  Surgeon: Rexene Alberts, MD;  Location[facility];  Service: Open Heart Surgery;  Laterality: N/A;    Social History   Socioeconomic History  . Marital status: Unknown    Spouse name: Not on file  . Number of children: Not on file  . Years of education: Not on file  . Highest education level: Not on file  Occupational History  . Not on file  Tobacco Use  . Smoking status: Never Smoker  . Smokeless tobacco: Never Used  Substance and Sexual Activity  . Alcohol use: No  . Drug use: No  . Sexual activity: Not Currently  Other Topics Concern  . Not on file  Social History Narrative  . Not on file   Social Determinants of Health   Financial Resource Strain:   . Difficulty of Paying Living Expenses: Not on file  Food Insecurity:   . Worried About Charity fundraiser in the Last Year: Not on file  . Ran Out of Food in the Last Year: Not on file  Transportation Needs:   . Lack of Transportation (Medical): Not on file  . Lack of Transportation (Non-Medical): Not on file  Physical Activity:   . Days of Exercise per Week: Not on  file  . Minutes of Exercise per Session: Not on file  Stress:   . Feeling of Stress : Not on file  Social Connections:   . Frequency of Communication with Friends and Family: Not on file  . Frequency of Social Gatherings with Friends and Family: Not on file  . Attends Religious Services: Not on file  . Active Member of Clubs or Organizations: Not on file  . Attends Archivist Meetings: Not on file  . Marital Status: Not on file  Intimate Partner Violence:   . Fear of Current or Ex-Partner: Not on file  . Emotionally Abused: Not on file  . Physically Abused: Not on file  . Sexually Abused: Not on file    Family History  Problem Relation Age of Onset  . Hypertension Mother   . Stroke Mother   . Leukemia Father   . Heart attack Paternal Grandmother   . Stroke Paternal  Grandfather   . Heart Problems Son 68  . Heart Problems Son 61  . Uterine cancer Sister   . Prostate cancer Brother   . Colon cancer Brother   . Kidney failure Brother   . Stroke Sister      Current Outpatient Medications:  .  amLODipine (NORVASC) 5 MG tablet, Take 1 tablet (5 mg total) by mouth daily., Disp: 90 tablet, Rfl: 1 .  aspirin EC 81 MG tablet, Take 81 mg by mouth daily., Disp: , Rfl:  .  atorvastatin (LIPITOR) 20 MG tablet, TAKE ONE TABLET BY MOUTH DAILY, Disp: 90 tablet, Rfl: 0 .  carvedilol (COREG) 12.5 MG tablet, Take 1 tablet (12.5 mg total) by mouth 2 (two) times daily., Disp: 180 tablet, Rfl: 1 .  cholecalciferol (VITAMIN D3) 25 MCG (1000 UT) tablet, Take 1,000 Units by mouth daily., Disp: , Rfl:  .  doxazosin (CARDURA) 2 MG tablet, TAKE ONE TABLET BY MOUTH DAILY, Disp: 90 tablet, Rfl: 3 .  ferrous sulfate 325 (65 FE) MG EC tablet, Take 325 mg by mouth daily with breakfast. , Disp: , Rfl:  .  hydrALAZINE (APRESOLINE) 25 MG tablet, Take 2 tablets (50 mg total) by mouth 3 (three) times daily., Disp: 540 tablet, Rfl: 1 .  isosorbide mononitrate (IMDUR) 30 MG 24 hr tablet, Take 1 tablet (30 mg total) by mouth daily., Disp: 90 tablet, Rfl: 3 .  omeprazole (PRILOSEC) 20 MG capsule, Take 20 mg by mouth daily., Disp: , Rfl:  .  torsemide (DEMADEX) 20 MG tablet, Take 1 tablet (20 mg total) by mouth daily as needed., Disp: 180 tablet, Rfl: 3  No results found.  No images are attached to the encounter.   CMP Latest Ref Rng & Units 08/01/2019  Glucose 65 - 99 mg/dL 88  BUN 8 - 27 mg/dL 32(H)  Creatinine 0.76 - 1.27 mg/dL 2.07(H)  Sodium 134 - 144 mmol/L 139  Potassium 3.5 - 5.2 mmol/L 4.5  Chloride 96 - 106 mmol/L 105  CO2 20 - 29 mmol/L 20  Calcium 8.6 - 10.2 mg/dL 8.6  Total Protein 6.5 - 8.1 g/dL -  Total Bilirubin 0.3 - 1.2 mg/dL -  Alkaline Phos 38 - 126 U/L -  AST 15 - 41 U/L -  ALT 0 - 44 U/L -   CBC Latest Ref Rng & Units 11/20/2019  WBC 4.0 - 10.5 K/uL 5.6   Hemoglobin 13.0 - 17.0 g/dL 9.8(L)  Hematocrit 39.0 - 52.0 % 29.8(L)  Platelets 150 - 400 K/uL 124(L)     Observation/objective:appears  in no acute distress over video visit today. Breathing is non labored.   Assessment and plan:Patient is a 83 yr old male with h/o anemia of CKD here for routine f/u  1. Anemia of CKD: Hb remains stable close to 10. 9.8 today. Iron studies are unremarkable. Will continue monitoring hb and will consider EPO when it drifts down closer to 8-9.   2. Thrombocytopenia: stable. Continue to monitor  Follow-up instructions:  I discussed the assessment and treatment plan with the patient. The patient was provided an opportunity to ask questions and all were answered. The patient agreed with the plan and demonstrated an understanding of the instructions.   The patient was advised to call back or seek an in-person evaluation if the symptoms worsen or if the condition fails to improve as anticipated.  Visit Diagnosis: 1. Anemia in chronic kidney disease, unspecified CKD stage     Dr. Randa Evens, MD, MPH [facility] Pager- [phone] 11/26/2019 8:02 AM

## 2019-12-04 ENCOUNTER — Other Ambulatory Visit: Payer: Self-pay | Admitting: Internal Medicine

## 2019-12-08 ENCOUNTER — Other Ambulatory Visit: Payer: Self-pay | Admitting: Physician Assistant

## 2019-12-18 DIAGNOSIS — N184 Chronic kidney disease, stage 4 (severe): Secondary | ICD-10-CM | POA: Diagnosis not present

## 2020-01-16 ENCOUNTER — Other Ambulatory Visit: Payer: Self-pay | Admitting: Internal Medicine

## 2020-01-23 DIAGNOSIS — D696 Thrombocytopenia, unspecified: Secondary | ICD-10-CM | POA: Diagnosis not present

## 2020-01-23 DIAGNOSIS — E7800 Pure hypercholesterolemia, unspecified: Secondary | ICD-10-CM | POA: Diagnosis not present

## 2020-01-23 DIAGNOSIS — E559 Vitamin D deficiency, unspecified: Secondary | ICD-10-CM | POA: Diagnosis not present

## 2020-01-23 DIAGNOSIS — D649 Anemia, unspecified: Secondary | ICD-10-CM | POA: Diagnosis not present

## 2020-01-23 DIAGNOSIS — I10 Essential (primary) hypertension: Secondary | ICD-10-CM | POA: Diagnosis not present

## 2020-01-23 DIAGNOSIS — R5383 Other fatigue: Secondary | ICD-10-CM | POA: Diagnosis not present

## 2020-01-23 DIAGNOSIS — R972 Elevated prostate specific antigen [PSA]: Secondary | ICD-10-CM | POA: Diagnosis not present

## 2020-01-23 DIAGNOSIS — D519 Vitamin B12 deficiency anemia, unspecified: Secondary | ICD-10-CM | POA: Diagnosis not present

## 2020-01-23 DIAGNOSIS — E538 Deficiency of other specified B group vitamins: Secondary | ICD-10-CM | POA: Diagnosis not present

## 2020-01-27 ENCOUNTER — Ambulatory Visit: Payer: Medicare HMO | Attending: Internal Medicine

## 2020-01-27 DIAGNOSIS — Z23 Encounter for immunization: Secondary | ICD-10-CM | POA: Insufficient documentation

## 2020-01-27 NOTE — Progress Notes (Signed)
   Covid-19 Vaccination Clinic  Name:  AVERIE MEINER    MRN: [id number] DOB: 25-Jul-1936  01/27/2020  Mr. Erven was observed post Covid-19 immunization for 15 minutes without incidence. He was provided with Vaccine Information Sheet and instruction to access the V-Safe system.   Mr. Lipa was instructed to call 911 with any severe reactions post vaccine: [name] Kitchen Difficulty breathing  . Swelling of your face and throat  . A fast heartbeat  . A bad rash all over your body  . Dizziness and weakness    Immunizations Administered    Name Date Dose VIS Date Route   Pfizer COVID-19 Vaccine 01/27/2020 12:44 PM 0.3 mL 11/16/2019 Intramuscular   Manufacturer: [place]   Lot: [id number]   [place]: [id number]

## 2020-02-12 ENCOUNTER — Other Ambulatory Visit: Payer: Self-pay | Admitting: Physician Assistant

## 2020-02-19 ENCOUNTER — Ambulatory Visit: Payer: Medicare HMO | Attending: Internal Medicine

## 2020-02-19 DIAGNOSIS — Z23 Encounter for immunization: Secondary | ICD-10-CM

## 2020-02-19 NOTE — Progress Notes (Signed)
   Covid-19 Vaccination Clinic  Name:  Rodney Wiley    MRN: [id number] DOB: Apr 14, 1936  02/19/2020  [name] was observed post Covid-19 immunization for 15 minutes without incident. He was provided with Vaccine Information Sheet and instruction to access the V-Safe system.   [name] was instructed to call 911 with any severe reactions post vaccine: [name] Kitchen Difficulty breathing  . Swelling of face and throat  . A fast heartbeat  . A bad rash all over body  . Dizziness and weakness   Immunizations Administered    Name Date Dose VIS Date Route   Pfizer COVID-19 Vaccine 02/19/2020  8:59 AM 0.3 mL 11/16/2019 Intramuscular   Manufacturer: [place]   Lot: [id number]   [place]: [id number]

## 2020-03-05 ENCOUNTER — Other Ambulatory Visit: Payer: Self-pay | Admitting: Internal Medicine

## 2020-04-16 ENCOUNTER — Other Ambulatory Visit: Payer: Self-pay | Admitting: Internal Medicine

## 2020-04-22 DIAGNOSIS — N400 Enlarged prostate without lower urinary tract symptoms: Secondary | ICD-10-CM | POA: Diagnosis not present

## 2020-04-22 DIAGNOSIS — D649 Anemia, unspecified: Secondary | ICD-10-CM | POA: Diagnosis not present

## 2020-04-22 DIAGNOSIS — E559 Vitamin D deficiency, unspecified: Secondary | ICD-10-CM | POA: Diagnosis not present

## 2020-04-22 DIAGNOSIS — E6609 Other obesity due to excess calories: Secondary | ICD-10-CM | POA: Diagnosis not present

## 2020-04-22 DIAGNOSIS — E538 Deficiency of other specified B group vitamins: Secondary | ICD-10-CM | POA: Diagnosis not present

## 2020-04-22 DIAGNOSIS — R5383 Other fatigue: Secondary | ICD-10-CM | POA: Diagnosis not present

## 2020-04-22 DIAGNOSIS — R5381 Other malaise: Secondary | ICD-10-CM | POA: Diagnosis not present

## 2020-04-22 DIAGNOSIS — D696 Thrombocytopenia, unspecified: Secondary | ICD-10-CM | POA: Diagnosis not present

## 2020-04-22 DIAGNOSIS — I5032 Chronic diastolic (congestive) heart failure: Secondary | ICD-10-CM | POA: Diagnosis not present

## 2020-04-22 DIAGNOSIS — I10 Essential (primary) hypertension: Secondary | ICD-10-CM | POA: Diagnosis not present

## 2020-04-22 DIAGNOSIS — E118 Type 2 diabetes mellitus with unspecified complications: Secondary | ICD-10-CM | POA: Diagnosis not present

## 2020-04-22 DIAGNOSIS — I2510 Atherosclerotic heart disease of native coronary artery without angina pectoris: Secondary | ICD-10-CM | POA: Diagnosis not present

## 2020-04-22 DIAGNOSIS — R7989 Other specified abnormal findings of blood chemistry: Secondary | ICD-10-CM | POA: Diagnosis not present

## 2020-04-22 DIAGNOSIS — Z8249 Family history of ischemic heart disease and other diseases of the circulatory system: Secondary | ICD-10-CM | POA: Diagnosis not present

## 2020-04-22 DIAGNOSIS — E785 Hyperlipidemia, unspecified: Secondary | ICD-10-CM | POA: Diagnosis not present

## 2020-04-22 DIAGNOSIS — D519 Vitamin B12 deficiency anemia, unspecified: Secondary | ICD-10-CM | POA: Diagnosis not present

## 2020-04-22 DIAGNOSIS — E7800 Pure hypercholesterolemia, unspecified: Secondary | ICD-10-CM | POA: Diagnosis not present

## 2020-04-22 DIAGNOSIS — E119 Type 2 diabetes mellitus without complications: Secondary | ICD-10-CM | POA: Diagnosis not present

## 2020-04-23 DIAGNOSIS — G603 Idiopathic progressive neuropathy: Secondary | ICD-10-CM | POA: Diagnosis not present

## 2020-04-23 DIAGNOSIS — G608 Other hereditary and idiopathic neuropathies: Secondary | ICD-10-CM | POA: Diagnosis not present

## 2020-04-23 DIAGNOSIS — M79661 Pain in right lower leg: Secondary | ICD-10-CM | POA: Diagnosis not present

## 2020-04-28 DIAGNOSIS — I10 Essential (primary) hypertension: Secondary | ICD-10-CM | POA: Diagnosis not present

## 2020-04-28 DIAGNOSIS — H547 Unspecified visual loss: Secondary | ICD-10-CM | POA: Diagnosis not present

## 2020-04-28 DIAGNOSIS — G8929 Other chronic pain: Secondary | ICD-10-CM | POA: Diagnosis not present

## 2020-04-28 DIAGNOSIS — E785 Hyperlipidemia, unspecified: Secondary | ICD-10-CM | POA: Diagnosis not present

## 2020-04-28 DIAGNOSIS — K08409 Partial loss of teeth, unspecified cause, unspecified class: Secondary | ICD-10-CM | POA: Diagnosis not present

## 2020-04-28 DIAGNOSIS — I4891 Unspecified atrial fibrillation: Secondary | ICD-10-CM | POA: Diagnosis not present

## 2020-04-28 DIAGNOSIS — I2510 Atherosclerotic heart disease of native coronary artery without angina pectoris: Secondary | ICD-10-CM | POA: Diagnosis not present

## 2020-04-28 DIAGNOSIS — D649 Anemia, unspecified: Secondary | ICD-10-CM | POA: Diagnosis not present

## 2020-04-28 DIAGNOSIS — D6869 Other thrombophilia: Secondary | ICD-10-CM | POA: Diagnosis not present

## 2020-04-28 DIAGNOSIS — K219 Gastro-esophageal reflux disease without esophagitis: Secondary | ICD-10-CM | POA: Diagnosis not present

## 2020-05-07 ENCOUNTER — Other Ambulatory Visit: Payer: Self-pay

## 2020-05-07 ENCOUNTER — Ambulatory Visit: Payer: Medicare HMO | Admitting: Internal Medicine

## 2020-05-07 ENCOUNTER — Encounter: Payer: Self-pay | Admitting: Internal Medicine

## 2020-05-07 VITALS — BP 160/60 | HR 66 | Ht 67.0 in | Wt 178.0 lb

## 2020-05-07 DIAGNOSIS — I712 Thoracic aortic aneurysm, without rupture, unspecified: Secondary | ICD-10-CM

## 2020-05-07 DIAGNOSIS — I351 Nonrheumatic aortic (valve) insufficiency: Secondary | ICD-10-CM | POA: Diagnosis not present

## 2020-05-07 DIAGNOSIS — I2510 Atherosclerotic heart disease of native coronary artery without angina pectoris: Secondary | ICD-10-CM

## 2020-05-07 DIAGNOSIS — I5032 Chronic diastolic (congestive) heart failure: Secondary | ICD-10-CM

## 2020-05-07 MED ORDER — TORSEMIDE 20 MG PO TABS: 20.0000 mg | ORAL_TABLET | Freq: Every day | ORAL | 1 refills | Status: DC

## 2020-05-07 MED ORDER — HYDRALAZINE HCL 25 MG PO TABS: ORAL_TABLET | ORAL | 2 refills | Status: DC

## 2020-05-07 MED ORDER — ISOSORBIDE MONONITRATE ER 30 MG PO TB24: 30.0000 mg | ORAL_TABLET | Freq: Every day | ORAL | 3 refills | Status: DC

## 2020-05-07 NOTE — Progress Notes (Signed)
Follow-up Outpatient Visit Date: 05/07/2020  Primary Care Provider: Remi Haggard, [place] [address]  Chief Complaint: Follow-up hypertension, coronary artery disease, and valvular heart disease.  HPI:  Mr. Leisinger is a 84 y.o. male with history of hypertension,coronary artery disease status post CABG (3/19)aortic regurgitationandthoracic aortic aneurysmstatus post Bentall with bioprosthetic aortic valve replacement (3/19),SVT, HFpEF,and chronic kidney disease stage 3withquestionableleft renal artery stenosis, who presents for follow-up of coronary artery disease, valvular heart disease, thoracic aortic aneurysm, HFpEF, and SVT.  I last saw Mr. Maysonet 6 months ago, at which time he reported occasional mild left-sided chest pain adjacent to his median sternotomy scar.  He described it as a "drawing feeling" that would come and go over the course of a few days and was not associated with exertion.  We agreed to defer medication changes and additional testing.  Today, Mr. Kiser reports that he is doing well.  He has waxing and waning lower extremity edema, right greater than left, for which he continues to use as needed torsemide 20 mg daily.  He has not had any significant shortness of breath nor chest pain, palpitations, or lightheadedness.  He is tolerating his current medications well.  His home blood pressures are typically around 140/80.  He is able to mow his grass with a self-propelled mower for 30 minutes at a time without difficulty.  This is an improvement over last year.  --------------------------------------------------------------------------------------------------  Cardiovascular History & Procedures: Cardiovascular Problems:  Thoracic aortic aneurysms/p Bentall  Aortic regurgitations/p bioprosthetic AVR  Mitral regurgitation  Questionable renal artery stenosis  Coronary artery disease s/p CABG  Supraventricular  tachycardia  Risk Factors:  Known CAD, hypertension, hyperlipidemia, male gender, and age > 4  Cath/PCI:  Coronary angiography/RHC (01/16/2018): LMCA normal. LAD with sequential 20% ostial, 40% proximal, and long 70% proximal to mid stenoses. LCx with 60% proximal stenosis and 60 to 80-90% OM2 and OM3 lesions. Chronic total occlusion of the proximal/mid RCA with left-to-right collaterals. RA 9, RV 43/10, PA 37/15 (22), and PCWP 18. Fick CO/CI 5.1/2.6.  CV Surgery:  CABG and Bentall with bioprosthetic AVR (02/22/2018, Dr. Roxy Manns): LIMA to LAD, SVG to OM2, and SVG to PDA. 24 mm synthetic aortic root graft and 21 mm Edwards InspirisRoselia stented bovine pericardial tissue valve.  EP Procedures and Devices:  Event monitor (01/22/19): Patient was monitored for 2 days, 22 hours.Predominantly sinus rhythm with rare PAC's and frequent PVC's. Several brief atrial runs were noted.PVC burden 8.4%.  Non-Invasive Evaluation(s):  Renal artery Doppler (02/06/19): No evidence of significant renal artery stenosis affecting either kidney.  TTE (04/07/18): Normal LV size with mild LVH. LVEF 50 to 55% with anteroseptal and septal hypokinesis. Grade 2 diastolic dysfunction. Bioprosthetic aortic valve present with mean gradient of 17 mmHg. Mild mitral regurgitation. Normal RV size and function. No pericardial effusion.  TEE (09/27/17): Mildly dilated LV with LVEF 60-65%. Moderate aortic regurgitation. Mild to moderate mitral regurgitation. Mild right atrial enlargement. Moderate aortic dilation, with root measuring 5.0 cm and ascending aorta 4.7 cm  MRA chest (06/06/17): Stable ascending aortic aneurysm, measuring 5.0 cm atthe sinotubular junction and 4.8 cm at the level of the main pulmonary artery.  Renal artery Doppler (01/13/17): Aortic atherosclerosis without stenosis or aneurysm. Normal right renal artery. Mild to moderate left renal artery stenosis (1-59%). Normal/symmetric kidney  size.  MRA chest (12/27/16): Aneurysmal disease of the ascending aorta, measuring 4.3 cm at the sinus of Valsalva and 4.9 cm above the sinotubular junction.  TTE (12/14/16): Mildly dilated LV with mild LVH. Normal contraction with LVEF 55-60%. Grade 1 diastolic dysfunction. Mild to moderate aortic regurgitation. Dilated aorta, measuring up to 4.9 cm above the sinotubular junction. Mild to moderate mitral regurgitation. Mild left atrial enlargement. Normal RV size and function.  Transthoracic echo (09/2016, [facility]): Full report not available. Reportedly showed LVH with LVEF 89% and diastolic dysfunction. MR and moderate to severe AI present with sclerotic aortic valve.  Carotid Doppler (10/201&): Full report not available. Reportedly showed 1-39% stenosis bilaterally.  Transthoracic echo (02/18/16, [facility]): Normal LV and RV size and function. LVEF >55%. Moderate AI; mild MR and TR.  Recent CV Pertinent Labs: Lab Results  Component Value Date   CHOL 132 09/15/2018   HDL 33 (L) 09/15/2018   LDLCALC 56 09/15/2018   TRIG 214 (H) 09/15/2018   CHOLHDL 4.0 09/15/2018   CHOLHDL 3.8 03/11/2017   INR 2.9 05/24/2018   INR 7.53 (HH) 03/27/2018   K 4.5 08/01/2019   MG 2.2 08/01/2019   BUN 32 (H) 08/01/2019   CREATININE 2.07 (H) 08/01/2019    Past medical and surgical history were reviewed and updated in EPIC.  Current Meds  Medication Sig  . amLODipine (NORVASC) 5 MG tablet TAKE ONE TABLET BY MOUTH DAILY  . aspirin EC 81 MG tablet Take 81 mg by mouth daily.  [name] Kitchen atorvastatin (LIPITOR) 20 MG tablet TAKE ONE TABLET BY MOUTH DAILY  . carvedilol (COREG) 12.5 MG tablet Take 1 tablet (12.5 mg total) by mouth 2 (two) times daily.  . cholecalciferol (VITAMIN D3) 25 MCG (1000 UT) tablet Take 1,000 Units by mouth daily.  [name] Kitchen doxazosin (CARDURA) 2 MG tablet TAKE ONE TABLET BY MOUTH DAILY  . ferrous sulfate 325 (65 FE) MG EC tablet Take 325 mg by mouth daily with breakfast.    . hydrALAZINE (APRESOLINE) 25 MG tablet TAKE TWO TABLETS BY MOUTH THREE TIMES A DAY  . isosorbide mononitrate (IMDUR) 30 MG 24 hr tablet Take 1 tablet (30 mg total) by mouth daily.  [name] Kitchen omeprazole (PRILOSEC) 20 MG capsule Take 20 mg by mouth daily.  . [DISCONTINUED] hydrALAZINE (APRESOLINE) 25 MG tablet TAKE TWO TABLETS BY MOUTH THREE TIMES A DAY  . [DISCONTINUED] isosorbide mononitrate (IMDUR) 30 MG 24 hr tablet TAKE ONE TABLET BY MOUTH DAILY  . [DISCONTINUED] torsemide (DEMADEX) 20 MG tablet Take 1 tablet (20 mg total) by mouth daily as needed.    Allergies: Patient has no known allergies.  Social History   Tobacco Use  . Smoking status: Never Smoker  . Smokeless tobacco: Never Used  Substance Use Topics  . Alcohol use: No  . Drug use: No    Family History  Problem Relation Age of Onset  . Hypertension Mother   . Stroke Mother   . Leukemia Father   . Heart attack Paternal Grandmother   . Stroke Paternal Grandfather   . Heart Problems Son 54  . Heart Problems Son 24  . Uterine cancer Sister   . Prostate cancer Brother   . Colon cancer Brother   . Kidney failure Brother   . Stroke Sister     Review of Systems: A 12-system review of systems was performed and was negative except as noted in the HPI.  --------------------------------------------------------------------------------------------------  Physical Exam: BP (!) 160/60 (BP Location: Left Arm, Patient Position: Sitting, Cuff Size: Normal)   Pulse 66   Ht 5\' 7"  (1.702 m)   Wt 178 lb (80.7 kg)   SpO2 98%  BMI 27.88 kg/m   General: NAD. Neck: No JVD or HJR. Lungs: Clear to auscultation without wheezes or crackles. Heart: Regular rate and rhythm with occasional extrasystoles.  2/6 systolic murmur.  No rubs or gallops. Abdomen: Soft, nontender, nondistended. Extremities: 1+ right and trace left pretibial edema.  EKG: Normal sinus rhythm with first-degree AV block, left axis deviation, and poor R wave  progression.  Lab Results  Component Value Date   WBC 5.6 11/20/2019   HGB 9.8 (L) 11/20/2019   HCT 29.8 (L) 11/20/2019   MCV 98.7 11/20/2019   PLT 124 (L) 11/20/2019    Lab Results  Component Value Date   NA 139 08/01/2019   K 4.5 08/01/2019   CL 105 08/01/2019   CO2 20 08/01/2019   BUN 32 (H) 08/01/2019   CREATININE 2.07 (H) 08/01/2019   GLUCOSE 88 08/01/2019   ALT 16 05/18/2019    Lab Results  Component Value Date   CHOL 132 09/15/2018   HDL 33 (L) 09/15/2018   LDLCALC 56 09/15/2018   TRIG 214 (H) 09/15/2018   CHOLHDL 4.0 09/15/2018    --------------------------------------------------------------------------------------------------  ASSESSMENT AND PLAN: Chronic HFpEF: Mr. Mcdill appears mildly volume overloaded today.  We have agreed to make torsemide 20 mg daily until we follow-up.  If he gains more than 2 pounds in a day or 5 pounds in a week, he should increase torsemide to 40 mg daily until his weight is back to baseline.  We will check a basic metabolic panel today to ensure stable renal function and electrolytes.  Coronary artery disease: No symptoms to suggest worsening coronary insufficiency.  Continue current medications for secondary prevention.  First-degree AV block: Mild and decreased from prior visit.  Continue current dose of carvedilol with close EKG monitoring at follow-up visits.  Thoracic aortic aneurysm and aortic regurgitation status post Bentall: Other than mild edema, Mr. Harton is doing well without symptoms of worsening heart failure.  We will escalate his diuresis, as above.  No other medication changes at this time, though if blood pressure remains consistently elevated, we will need to consider augmenting his antihypertensive regimen.  Hypertension: Blood pressure moderately elevated today but typically better at home and on prior visits in the office.  We will make torsemide daily, which hopefully will offer some benefit.  Chronic  kidney disease stage IV: Increase torsemide, as above.  Otherwise no medication changes today.  We will check a basic metabolic panel today, given adjustment in diuretic regimen.  Hyperlipidemia: Mr. Usery reports recent lipid panel through his PCP.  He should continue atorvastatin 20 mg daily with goal LDL less than 70.  Follow-up: Return to clinic in 3 months.  Nelva Bush, MD 05/07/2020 3:09 PM

## 2020-05-07 NOTE — Patient Instructions (Signed)
Medication Instructions:  Your physician has recommended you make the following change in your medication:   START Torsemide 20 mg daily (instead of as needed)  *If you need a refill on your cardiac medications before your next appointment, please call your pharmacy*   Lab Work: Bmet today If you have labs (blood work) drawn today and your tests are completely normal, you will receive your results only by: [name] Kitchen MyChart Message (if you have MyChart) OR . A paper copy in the mail If you have any lab test that is abnormal or we need to change your treatment, we will call you to review the results.   Testing/Procedures: None ordered   Follow-Up: At [facility], you and your health needs are our priority.  As part of our continuing mission to provide you with exceptional heart care, we have created designated Provider Care Teams.  These Care Teams include your primary Cardiologist (physician) and Advanced Practice Providers (APPs -  Physician Assistants and Nurse Practitioners) who all work together to provide you with the care you need, when you need it.  We recommend signing up for the patient portal called "MyChart".  Sign up information is provided on this After Visit Summary.  MyChart is used to connect with patients for Virtual Visits (Telemedicine).  Patients are able to view lab/test results, encounter notes, upcoming appointments, etc.  Non-urgent messages can be sent to your provider as well.   To learn more about what you can do with MyChart, go to [email or web address].    Your next appointment:   3 month(s)  The format for your next appointment:   In Person  Provider:    You may see Nelva Bush, MD or one of the following Advanced Practice Providers on your designated Care Team:    Murray Hodgkins, NP  Christell Faith, PA-C  Marrianne Mood, PA-C    Other Instructions N/A

## 2020-05-08 LAB — BASIC METABOLIC PANEL
BUN/Creatinine Ratio: 17 (ref 10–24)
BUN: 34 mg/dL — ABNORMAL HIGH (ref 8–27)
CO2: 18 mmol/L — ABNORMAL LOW (ref 20–29)
Calcium: 8.3 mg/dL — ABNORMAL LOW (ref 8.6–10.2)
Chloride: 111 mmol/L — ABNORMAL HIGH (ref 96–106)
Creatinine, Ser: 2.05 mg/dL — ABNORMAL HIGH (ref 0.76–1.27)
GFR calc Af Amer: 34 mL/min/{1.73_m2} — ABNORMAL LOW (ref >59)
GFR calc non Af Amer: 30 mL/min/{1.73_m2} — ABNORMAL LOW (ref >59)
Glucose: 93 mg/dL (ref 65–99)
Potassium: 4.1 mmol/L (ref 3.5–5.2)
Sodium: 143 mmol/L (ref 134–144)

## 2020-05-19 ENCOUNTER — Other Ambulatory Visit: Payer: Medicare HMO

## 2020-05-20 ENCOUNTER — Telehealth: Payer: Self-pay

## 2020-05-20 ENCOUNTER — Inpatient Hospital Stay: Payer: Medicare HMO | Attending: Oncology | Admitting: Oncology

## 2020-05-20 ENCOUNTER — Inpatient Hospital Stay: Payer: Medicare HMO

## 2020-05-20 ENCOUNTER — Encounter: Payer: Self-pay | Admitting: Oncology

## 2020-05-20 ENCOUNTER — Other Ambulatory Visit: Payer: Self-pay

## 2020-05-20 VITALS — BP 156/77 | HR 67 | Temp 97.8°F | Resp 16 | Wt 177.3 lb

## 2020-05-20 DIAGNOSIS — N189 Chronic kidney disease, unspecified: Secondary | ICD-10-CM | POA: Diagnosis not present

## 2020-05-20 DIAGNOSIS — N183 Chronic kidney disease, stage 3 unspecified: Secondary | ICD-10-CM | POA: Diagnosis present

## 2020-05-20 DIAGNOSIS — D631 Anemia in chronic kidney disease: Secondary | ICD-10-CM

## 2020-05-20 DIAGNOSIS — D696 Thrombocytopenia, unspecified: Secondary | ICD-10-CM | POA: Diagnosis not present

## 2020-05-20 DIAGNOSIS — E611 Iron deficiency: Secondary | ICD-10-CM | POA: Diagnosis not present

## 2020-05-20 DIAGNOSIS — D649 Anemia, unspecified: Secondary | ICD-10-CM | POA: Insufficient documentation

## 2020-05-20 DIAGNOSIS — I129 Hypertensive chronic kidney disease with stage 1 through stage 4 chronic kidney disease, or unspecified chronic kidney disease: Secondary | ICD-10-CM | POA: Insufficient documentation

## 2020-05-20 LAB — CBC WITH DIFFERENTIAL/PLATELET
Abs Immature Granulocytes: 0.01 10*3/uL (ref 0.00–0.07)
Basophils Absolute: 0 10*3/uL (ref 0.0–0.1)
Basophils Relative: 0 %
Eosinophils Absolute: 0.4 10*3/uL (ref 0.0–0.5)
Eosinophils Relative: 7 %
HCT: 26 % — ABNORMAL LOW (ref 39.0–52.0)
Hemoglobin: 8.9 g/dL — ABNORMAL LOW (ref 13.0–17.0)
Immature Granulocytes: 0 %
Lymphocytes Relative: 20 %
Lymphs Abs: 1.1 10*3/uL (ref 0.7–4.0)
MCH: 32.6 pg (ref 26.0–34.0)
MCHC: 34.2 g/dL (ref 30.0–36.0)
MCV: 95.2 fL (ref 80.0–100.0)
Monocytes Absolute: 0.5 10*3/uL (ref 0.1–1.0)
Monocytes Relative: 9 %
Neutro Abs: 3.4 10*3/uL (ref 1.7–7.7)
Neutrophils Relative %: 64 %
Platelets: 143 10*3/uL — ABNORMAL LOW (ref 150–400)
RBC: 2.73 MIL/uL — ABNORMAL LOW (ref 4.22–5.81)
RDW: 13.4 % (ref 11.5–15.5)
WBC: 5.3 10*3/uL (ref 4.0–10.5)
nRBC: 0 % (ref 0.0–0.2)

## 2020-05-20 LAB — COMPREHENSIVE METABOLIC PANEL
ALT: 16 U/L (ref 0–44)
AST: 19 U/L (ref 15–41)
Albumin: 3.4 g/dL — ABNORMAL LOW (ref 3.5–5.0)
Alkaline Phosphatase: 54 U/L (ref 38–126)
Anion gap: 7 (ref 5–15)
BUN: 36 mg/dL — ABNORMAL HIGH (ref 8–23)
CO2: 21 mmol/L — ABNORMAL LOW (ref 22–32)
Calcium: 8 mg/dL — ABNORMAL LOW (ref 8.9–10.3)
Chloride: 109 mmol/L (ref 98–111)
Creatinine, Ser: 2.16 mg/dL — ABNORMAL HIGH (ref 0.61–1.24)
GFR calc Af Amer: 32 mL/min — ABNORMAL LOW (ref >60)
GFR calc non Af Amer: 28 mL/min — ABNORMAL LOW (ref >60)
Glucose, Bld: 95 mg/dL (ref 70–99)
Potassium: 4.1 mmol/L (ref 3.5–5.1)
Sodium: 137 mmol/L (ref 135–145)
Total Bilirubin: 0.8 mg/dL (ref 0.3–1.2)
Total Protein: 6.4 g/dL — ABNORMAL LOW (ref 6.5–8.1)

## 2020-05-20 LAB — IRON AND TIBC
Iron: 60 ug/dL (ref 45–182)
Saturation Ratios: 25 % (ref 17.9–39.5)
TIBC: 245 ug/dL — ABNORMAL LOW (ref 250–450)
UIBC: 185 ug/dL

## 2020-05-20 LAB — FERRITIN: Ferritin: 80 ng/mL (ref 24–336)

## 2020-05-20 NOTE — Telephone Encounter (Signed)
-----   Message from Sindy Guadeloupe, MD sent at 05/20/2020  3:00 PM EDT ----- Ferritin is less than 100.  I would recommend a trial of IV iron first before trying Retacrit.  Please touch base with Luanne to see what type of IV iron he can get and schedule accordingly

## 2020-05-20 NOTE — Progress Notes (Signed)
Pt gets tired sometimes, but he stays busy most of the day every day, he eats tid and has good BM

## 2020-05-20 NOTE — Telephone Encounter (Signed)
Ok will put in venofer treatment plan. 5 doses. Heather please schedule

## 2020-05-21 ENCOUNTER — Telehealth: Payer: Self-pay | Admitting: *Deleted

## 2020-05-21 NOTE — Telephone Encounter (Signed)
Called patient to let them know that I have the appointments for his Venofer which is the iron treatment.  The appointments are June 22 June 24 June 28 fall at 1130.  In June 30 and July 2 at 2 PM for each 1.  Patient wrote all the dates and times down and I told him when he comes in on June 22 if he would like a print out of the appointments that we could printed out at that time.  Patient appreciates that and will see [place] on June 22

## 2020-05-23 ENCOUNTER — Encounter: Payer: Self-pay | Admitting: Oncology

## 2020-05-23 NOTE — Progress Notes (Signed)
Hematology/Oncology Consult note [facility]  Telephone:([phone] [phone]  Patient Care Team: Remi Haggard, FNP as PCP - General (Family Medicine) End, Harrell Gave, MD as PCP - Cardiology (Cardiology)   Name of the patient: Rodney Wiley  [id number]  Sep 10, 1936   Date of visit: 05/23/20  Diagnosis- anemia likely due to CKD Mild thrombocytopenia  Chief complaint/ Reason for visit-routine follow-up of anemia and thrombocytopenia  Heme/Onc history: patient is a 84 year old male with a past medical history significant for hypertension, hyperlipidemia, stage III CKD and history of aortic insufficiency as well as ascending aortic aneurysm. He recently underwent bioprosthetic aortic valve replacement, CABG x3 and resuspension of the left atrium on 02/22/2018.   Results of blood work from 03/24/2018 were as follows: CBC showed white count of 7.7, H&H 11.4/34.1 with an MCV of 93.9 and a platelet count of 134. CMP showed elevated creatinine of 2.4. AST and ALT were normal. Folate and B12 are normal. Ferritin and iron studies were normal. Haptoglobin and TSH was normal. Multiple myeloma panel revealed no monoclonal protein.  Interval history-patient is doing well for his age.  He denies any significant complaints at this time Other than occasional fatigue.  ECOG PS- 1 Pain scale- 0   Review of systems- Review of Systems  Constitutional: Positive for malaise/fatigue. Negative for chills, fever and weight loss.  HENT: Negative for congestion, ear discharge and nosebleeds.   Eyes: Negative for blurred vision.  Respiratory: Negative for cough, hemoptysis, sputum production, shortness of breath and wheezing.   Cardiovascular: Negative for chest pain, palpitations, orthopnea and claudication.  Gastrointestinal: Negative for abdominal pain, blood in stool, constipation, diarrhea, heartburn, melena, nausea and vomiting.  Genitourinary: Negative  for dysuria, flank pain, frequency, hematuria and urgency.  Musculoskeletal: Negative for back pain, joint pain and myalgias.  Skin: Negative for rash.  Neurological: Negative for dizziness, tingling, focal weakness, seizures, weakness and headaches.  Endo/Heme/Allergies: Does not bruise/bleed easily.  Psychiatric/Behavioral: Negative for depression and suicidal ideas. The patient does not have insomnia.       No Known Allergies   Past Medical History:  Diagnosis Date  . Anemia   . Aortic insufficiency    a. 02/2018 s/p bioprosthetic AVR; 04/2018 Echo: EF 50-55%, Gr2 DD, Ao bioprosthesis, mean grad 63mHg, Ao root/Asc Ao nl in size, mild MR, mildly dil LA, nl RV fxn. Nl PASP.  .[name] KitchenArthropathy   . Ascending aortic aneurysm ([place]    a. 12/2016 MRA: 4.3cm @ sinus of valsalva, 4.9cm above sinotubular jxn; b. 02/2018 s/p biological Bentall and AVR.  .[name] KitchenBPH (benign prostatic hyperplasia)   . 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 02/2018 Cath: LAD 20ost, 40p, 741mLCX 60p, OM2 60, OM3 85, RCA 10p/m w/ L->R and R->R collats;  b. 02/2018 CABG x 3: LIMA to LAD, SVG to OM2, SVG to PDA, EVH via right thigh.  . CKD (chronic kidney disease), stage III   . Diverticulitis   . Essential hypertension   . GERD (gastroesophageal reflux disease)   . Hemorrhoids   . History of chicken pox   . History of Helicobacter pylori infection 06/2007  . Hyperlipidemia   . Hypertension   . Left renal artery stenosis (HCC)    a. 01/2017 RA u/s: moderate L RAS.  . [name] Kitchenower extremity edema   . Nonrheumatic mitral (valve) insufficiency    a. 12/2016 Echo: mild to mod MR; b. 09/2017 TEE: mild to mod MR; c. 04/2018 Echo:  Mild MR.  [name] Kitchen Nonrheumatic pulmonary valve insufficiency   . 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 s/p 21 mm Edwards Inspiris Resilia stented bovine pericardial tissue valve and 24 mm Gelweave Valsalva synthetic root conduit with reimplantation  of left main coronary artery.     Past Surgical History:  Procedure Laterality Date  . AORTIC VALVE REPAIR N/A 02/22/2018   Procedure: AORTIC VALVE REPLACEMENT -Biological Bentall aortic root replacement,;  Surgeon: Rexene Alberts, MD;  Location: [place];  Service: Open Heart Surgery;  Laterality: N/A;  . bypass surgery    . CARDIAC CATHETERIZATION     01/16/2018  . COLONOSCOPY  2011   by Dr. Candace Cruise with findings of diverticulosis  . CORONARY ARTERY BYPASS GRAFT N/A 02/22/2018   Procedure: CORONARY ARTERY BYPASS GRAFTING (CABG) x three, using left internal mammary artery and right leg greater saphenous vein harvested endoscopically;  Surgeon: Rexene Alberts, MD;  Location: [place];  Service: Open Heart Surgery;  Laterality: N/A;  . CORONARY/GRAFT ANGIOGRAPHY N/A 01/16/2018   Procedure: CORONARY/GRAFT ANGIOGRAPHY;  Surgeon: Nelva Bush, MD;  Location: [place] CV LAB;  Service: Cardiovascular;  Laterality: N/A;  . ELBOW SURGERY Left   . hemorhoidectomy    . RIGHT HEART CATH N/A 01/16/2018   Procedure: RIGHT HEART CATH;  Surgeon: Nelva Bush, MD;  Location: [place] CV LAB;  Service: Cardiovascular;  Laterality: N/A;  . SHOULDER ARTHROSCOPY WITH ROTATOR CUFF REPAIR Right   . SHOULDER SURGERY Right   . TEE WITHOUT CARDIOVERSION N/A 09/27/2017   Procedure: TRANSESOPHAGEAL ECHOCARDIOGRAM (TEE);  Surgeon: Dorothy Spark, MD;  Location: [facility] ENDOSCOPY;  Service: Cardiovascular;  Laterality: N/A;  . TEE WITHOUT CARDIOVERSION N/A 02/22/2018   Procedure: TRANSESOPHAGEAL ECHOCARDIOGRAM (TEE);  Surgeon: Rexene Alberts, MD;  Location: [place];  Service: Open Heart Surgery;  Laterality: N/A;  . THORACIC AORTIC ANEURYSM REPAIR N/A 02/22/2018   Procedure: THORACIC ASCENDING ANEURYSM REPAIR (AAA) Resection of ascending aorta aneurysm;  Surgeon: Rexene Alberts, MD;  Location: [facility] OR;  Service: Open Heart Surgery;  Laterality: N/A;    Social History   Socioeconomic History  . Marital status:  Unknown    Spouse name: Not on file  . Number of children: Not on file  . Years of education: Not on file  . Highest education level: Not on file  Occupational History  . Not on file  Tobacco Use  . Smoking status: Never Smoker  . Smokeless tobacco: Never Used  Vaping Use  . Vaping Use: Never used  Substance and Sexual Activity  . Alcohol use: No  . Drug use: No  . Sexual activity: Not Currently  Other Topics Concern  . Not on file  Social History Narrative  . Not on file   Social Determinants of Health   Financial Resource Strain:   . Difficulty of Paying Living Expenses:   Food Insecurity:   . Worried About Charity fundraiser in the Last Year:   . Arboriculturist in the Last Year:   Transportation Needs:   . Film/video editor (Medical):   [name] Kitchen Lack of Transportation (Non-Medical):   Physical Activity:   . Days of Exercise per Week:   . Minutes of Exercise per Session:   Stress:   . Feeling of Stress :   Social Connections:   . Frequency of Communication with Friends and Family:   . Frequency of Social Gatherings with Friends  and Family:   . Attends Religious Services:   . Active Member of Clubs or Organizations:   . Attends Archivist Meetings:   [name] Kitchen Marital Status:   Intimate Partner Violence:   . Fear of Current or Ex-Partner:   . Emotionally Abused:   [name] Kitchen Physically Abused:   . Sexually Abused:     Family History  Problem Relation Age of Onset  . Hypertension Mother   . Stroke Mother   . Leukemia Father   . Heart attack Paternal Grandmother   . Stroke Paternal Grandfather   . Heart Problems Son 56  . Heart Problems Son 51  . Uterine cancer Sister   . Prostate cancer Brother   . Colon cancer Brother   . Kidney failure Brother   . Stroke Sister      Current Outpatient Medications:  .  amLODipine (NORVASC) 5 MG tablet, TAKE ONE TABLET BY MOUTH DAILY, Disp: 90 tablet, Rfl: 0 .  aspirin EC 81 MG tablet, Take 81 mg by mouth daily., Disp: ,  Rfl:  .  atorvastatin (LIPITOR) 20 MG tablet, TAKE ONE TABLET BY MOUTH DAILY, Disp: 90 tablet, Rfl: 0 .  carvedilol (COREG) 12.5 MG tablet, Take 1 tablet (12.5 mg total) by mouth 2 (two) times daily., Disp: 180 tablet, Rfl: 1 .  cholecalciferol (VITAMIN D3) 25 MCG (1000 UT) tablet, Take 1,000 Units by mouth daily., Disp: , Rfl:  .  doxazosin (CARDURA) 2 MG tablet, TAKE ONE TABLET BY MOUTH DAILY, Disp: 90 tablet, Rfl: 3 .  ferrous sulfate 325 (65 FE) MG EC tablet, Take 325 mg by mouth daily with breakfast. , Disp: , Rfl:  .  hydrALAZINE (APRESOLINE) 25 MG tablet, TAKE TWO TABLETS BY MOUTH THREE TIMES A DAY, Disp: 540 tablet, Rfl: 2 .  isosorbide mononitrate (IMDUR) 30 MG 24 hr tablet, Take 1 tablet (30 mg total) by mouth daily., Disp: 90 tablet, Rfl: 3 .  omeprazole (PRILOSEC) 20 MG capsule, Take 20 mg by mouth daily., Disp: , Rfl:  .  torsemide (DEMADEX) 20 MG tablet, Take 1 tablet (20 mg total) by mouth daily., Disp: 90 tablet, Rfl: 1  Physical exam:  Vitals:   05/20/20 1306  BP: (!) 156/77  Pulse: 67  Resp: 16  Temp: 97.8 F (36.6 C)  TempSrc: Oral  Weight: 177 lb 4.8 oz (80.4 kg)   Physical Exam Constitutional:      General: He is not in acute distress. Cardiovascular:     Rate and Rhythm: Normal rate and regular rhythm.     Heart sounds: Normal heart sounds.  Pulmonary:     Effort: Pulmonary effort is normal.     Breath sounds: Normal breath sounds.  Abdominal:     General: Bowel sounds are normal.     Palpations: Abdomen is soft.  Skin:    General: Skin is warm and dry.  Neurological:     Mental Status: He is alert and oriented to person, place, and time.      CMP Latest Ref Rng & Units 05/20/2020  Glucose 70 - 99 mg/dL 95  BUN 8 - 23 mg/dL 36(H)  Creatinine 0.61 - 1.24 mg/dL 2.16(H)  Sodium 135 - 145 mmol/L 137  Potassium 3.5 - 5.1 mmol/L 4.1  Chloride 98 - 111 mmol/L 109  CO2 22 - 32 mmol/L 21(L)  Calcium 8.9 - 10.3 mg/dL 8.0(L)  Total Protein 6.5 - 8.1 g/dL  6.4(L)  Total Bilirubin 0.3 - 1.2 mg/dL 0.8  Alkaline Phos  38 - 126 U/L 54  AST 15 - 41 U/L 19  ALT 0 - 44 U/L 16   CBC Latest Ref Rng & Units 05/20/2020  WBC 4.0 - 10.5 K/uL 5.3  Hemoglobin 13.0 - 17.0 g/dL 8.9(L)  Hematocrit 39 - 52 % 26.0(L)  Platelets 150 - 400 K/uL 143(L)     Assessment and plan- Patient is a 84 y.o. male with anemia of chronic kidney disease here for routine follow-up  Anemia of kidney disease: We have been monitoring patient's anemia of kidney disease for a while now without having to give him EPO since his hemoglobin was stable around ten.  Today his hemoglobin is drifted down to 8.9.Iron studies show a relatively low ferritin of eighty and iron studies with an iron saturation of 25%.  Given that his ferritin is less than one hundred I would like to give him a trial of IV iron first before proceeding with EPO.  His insurance will approve Venofer.  I will therefore proceed with five doses of Venofer and repeat his CBC ferritin and iron studies in 3 months as well as CMP and consider giving him epo at that time if hemoglobin is still less than 10.  We will also check B12, folate, TSH and reticulocyte count at that time.  Discussed risks and benefits of IV iron including all but not limited to headache, leg swelling and possible risk of infusion reaction.  Patient understands and agrees to proceed as planned  Thrombocytopenia: Mild and chronic.  Continue to monitor   Visit Diagnosis 1. Anemia in chronic kidney disease, unspecified CKD stage   2. Anemia of chronic renal failure, unspecified CKD stage   3. Thrombocytopenia ([place])      Dr. Randa Evens, MD, MPH [facility] [phone] 05/23/2020 10:01 AM

## 2020-05-27 ENCOUNTER — Other Ambulatory Visit: Payer: Self-pay

## 2020-05-27 ENCOUNTER — Inpatient Hospital Stay: Payer: Medicare HMO

## 2020-05-27 VITALS — BP 161/71 | HR 77 | Resp 18

## 2020-05-27 DIAGNOSIS — I129 Hypertensive chronic kidney disease with stage 1 through stage 4 chronic kidney disease, or unspecified chronic kidney disease: Secondary | ICD-10-CM | POA: Diagnosis not present

## 2020-05-27 DIAGNOSIS — N183 Chronic kidney disease, stage 3 unspecified: Secondary | ICD-10-CM | POA: Diagnosis not present

## 2020-05-27 DIAGNOSIS — D631 Anemia in chronic kidney disease: Secondary | ICD-10-CM

## 2020-05-27 DIAGNOSIS — E611 Iron deficiency: Secondary | ICD-10-CM | POA: Diagnosis not present

## 2020-05-27 DIAGNOSIS — D696 Thrombocytopenia, unspecified: Secondary | ICD-10-CM | POA: Diagnosis not present

## 2020-05-27 DIAGNOSIS — N189 Chronic kidney disease, unspecified: Secondary | ICD-10-CM

## 2020-05-27 MED ORDER — SODIUM CHLORIDE 0.9 % IV SOLN: 200.0000 mg | INTRAVENOUS | Status: DC

## 2020-05-27 MED ORDER — SODIUM CHLORIDE 0.9 % IV SOLN
Freq: Once | INTRAVENOUS | Status: AC
  Filled 2020-05-27: qty 250

## 2020-05-27 MED ORDER — IRON SUCROSE 20 MG/ML IV SOLN
200.0000 mg | Freq: Once | INTRAVENOUS | Status: AC
  Administered 2020-05-27: 200 mg via INTRAVENOUS
  Filled 2020-05-27: qty 10

## 2020-05-29 ENCOUNTER — Inpatient Hospital Stay: Payer: Medicare HMO

## 2020-05-29 ENCOUNTER — Other Ambulatory Visit: Payer: Self-pay

## 2020-05-29 VITALS — BP 165/80 | HR 60 | Temp 96.0°F | Resp 18

## 2020-05-29 DIAGNOSIS — D696 Thrombocytopenia, unspecified: Secondary | ICD-10-CM | POA: Diagnosis not present

## 2020-05-29 DIAGNOSIS — I129 Hypertensive chronic kidney disease with stage 1 through stage 4 chronic kidney disease, or unspecified chronic kidney disease: Secondary | ICD-10-CM | POA: Diagnosis not present

## 2020-05-29 DIAGNOSIS — N183 Chronic kidney disease, stage 3 unspecified: Secondary | ICD-10-CM | POA: Diagnosis not present

## 2020-05-29 DIAGNOSIS — D631 Anemia in chronic kidney disease: Secondary | ICD-10-CM | POA: Diagnosis not present

## 2020-05-29 DIAGNOSIS — E611 Iron deficiency: Secondary | ICD-10-CM | POA: Diagnosis not present

## 2020-05-29 DIAGNOSIS — N189 Chronic kidney disease, unspecified: Secondary | ICD-10-CM

## 2020-05-29 MED ORDER — IRON SUCROSE 20 MG/ML IV SOLN
200.0000 mg | Freq: Once | INTRAVENOUS | Status: AC
  Administered 2020-05-29: 200 mg via INTRAVENOUS
  Filled 2020-05-29: qty 10

## 2020-05-29 MED ORDER — SODIUM CHLORIDE 0.9 % IV SOLN: 200.0000 mg | INTRAVENOUS | Status: DC

## 2020-05-29 MED ORDER — SODIUM CHLORIDE 0.9 % IV SOLN
Freq: Once | INTRAVENOUS | Status: AC
  Filled 2020-05-29: qty 250

## 2020-05-30 ENCOUNTER — Other Ambulatory Visit: Payer: Self-pay | Admitting: Internal Medicine

## 2020-06-02 ENCOUNTER — Other Ambulatory Visit: Payer: Self-pay

## 2020-06-02 ENCOUNTER — Inpatient Hospital Stay: Payer: Medicare HMO

## 2020-06-02 VITALS — BP 186/85 | HR 65 | Temp 96.6°F | Resp 16

## 2020-06-02 DIAGNOSIS — D631 Anemia in chronic kidney disease: Secondary | ICD-10-CM | POA: Diagnosis not present

## 2020-06-02 DIAGNOSIS — I129 Hypertensive chronic kidney disease with stage 1 through stage 4 chronic kidney disease, or unspecified chronic kidney disease: Secondary | ICD-10-CM | POA: Diagnosis not present

## 2020-06-02 DIAGNOSIS — E611 Iron deficiency: Secondary | ICD-10-CM | POA: Diagnosis not present

## 2020-06-02 DIAGNOSIS — N189 Chronic kidney disease, unspecified: Secondary | ICD-10-CM

## 2020-06-02 DIAGNOSIS — N183 Chronic kidney disease, stage 3 unspecified: Secondary | ICD-10-CM | POA: Diagnosis not present

## 2020-06-02 DIAGNOSIS — D696 Thrombocytopenia, unspecified: Secondary | ICD-10-CM | POA: Diagnosis not present

## 2020-06-02 MED ORDER — IRON SUCROSE 20 MG/ML IV SOLN
200.0000 mg | Freq: Once | INTRAVENOUS | Status: AC
  Administered 2020-06-02: 200 mg via INTRAVENOUS
  Filled 2020-06-02: qty 10

## 2020-06-02 MED ORDER — SODIUM CHLORIDE 0.9 % IV SOLN: 200.0000 mg | INTRAVENOUS | Status: DC

## 2020-06-02 MED ORDER — SODIUM CHLORIDE 0.9 % IV SOLN
Freq: Once | INTRAVENOUS | Status: AC
  Filled 2020-06-02: qty 250

## 2020-06-02 NOTE — Progress Notes (Signed)
11:35 BP 181/82; 11:42 BP 170/79; 11:53 BP 180/82. Informed Dr. Janese Banks and Team. Faythe Ghee to proceed with Venofer per Dr. Janese Banks.   12:23 Final BP 186/85 HR 65. Patient denies any S/S at this time. Dr. Janese Banks informed. Patient is to monitor BP at home and inform PCP. Informed patient of this information and educated on S/S as to when to seek emergency care. Patient verbalized understanding and denies any further questions or concerns.

## 2020-06-03 ENCOUNTER — Other Ambulatory Visit: Payer: Self-pay | Admitting: *Deleted

## 2020-06-03 MED ORDER — HYDRALAZINE HCL 100 MG PO TABS: 100.0000 mg | ORAL_TABLET | Freq: Three times a day (TID) | ORAL | 3 refills | Status: DC

## 2020-06-03 MED ORDER — HYDRALAZINE HCL 100 MG PO TABS: 100.0000 mg | ORAL_TABLET | Freq: Three times a day (TID) | ORAL | Status: DC

## 2020-06-04 ENCOUNTER — Inpatient Hospital Stay: Payer: Medicare HMO

## 2020-06-04 ENCOUNTER — Other Ambulatory Visit: Payer: Self-pay

## 2020-06-04 VITALS — BP 159/76 | HR 65 | Temp 97.7°F | Resp 18

## 2020-06-04 DIAGNOSIS — E611 Iron deficiency: Secondary | ICD-10-CM | POA: Diagnosis not present

## 2020-06-04 DIAGNOSIS — D631 Anemia in chronic kidney disease: Secondary | ICD-10-CM

## 2020-06-04 DIAGNOSIS — I129 Hypertensive chronic kidney disease with stage 1 through stage 4 chronic kidney disease, or unspecified chronic kidney disease: Secondary | ICD-10-CM | POA: Diagnosis not present

## 2020-06-04 DIAGNOSIS — N189 Chronic kidney disease, unspecified: Secondary | ICD-10-CM

## 2020-06-04 DIAGNOSIS — D696 Thrombocytopenia, unspecified: Secondary | ICD-10-CM | POA: Diagnosis not present

## 2020-06-04 DIAGNOSIS — N183 Chronic kidney disease, stage 3 unspecified: Secondary | ICD-10-CM | POA: Diagnosis not present

## 2020-06-04 MED ORDER — SODIUM CHLORIDE 0.9 % IV SOLN
Freq: Once | INTRAVENOUS | Status: AC
  Filled 2020-06-04: qty 250

## 2020-06-04 MED ORDER — SODIUM CHLORIDE 0.9 % IV SOLN: 200.0000 mg | INTRAVENOUS | Status: DC

## 2020-06-04 MED ORDER — IRON SUCROSE 20 MG/ML IV SOLN
200.0000 mg | Freq: Once | INTRAVENOUS | Status: AC
  Administered 2020-06-04: 200 mg via INTRAVENOUS
  Filled 2020-06-04: qty 10

## 2020-06-06 ENCOUNTER — Other Ambulatory Visit: Payer: Self-pay

## 2020-06-06 ENCOUNTER — Inpatient Hospital Stay: Payer: Medicare HMO | Attending: Oncology

## 2020-06-06 VITALS — BP 192/68 | HR 70 | Temp 97.3°F | Resp 20

## 2020-06-06 DIAGNOSIS — E611 Iron deficiency: Secondary | ICD-10-CM | POA: Diagnosis present

## 2020-06-06 DIAGNOSIS — D631 Anemia in chronic kidney disease: Secondary | ICD-10-CM

## 2020-06-06 DIAGNOSIS — N183 Chronic kidney disease, stage 3 unspecified: Secondary | ICD-10-CM | POA: Insufficient documentation

## 2020-06-06 DIAGNOSIS — N189 Chronic kidney disease, unspecified: Secondary | ICD-10-CM

## 2020-06-06 MED ORDER — IRON SUCROSE 20 MG/ML IV SOLN
200.0000 mg | Freq: Once | INTRAVENOUS | Status: AC
  Administered 2020-06-06: 200 mg via INTRAVENOUS
  Filled 2020-06-06: qty 10

## 2020-06-06 MED ORDER — SODIUM CHLORIDE 0.9 % IV SOLN: 200.0000 mg | INTRAVENOUS | Status: DC

## 2020-06-06 MED ORDER — SODIUM CHLORIDE 0.9 % IV SOLN
Freq: Once | INTRAVENOUS | Status: AC
  Filled 2020-06-06: qty 250

## 2020-06-06 NOTE — Progress Notes (Signed)
Prior to venofer infusion pt.'s BP was 155/78. After venofer infusion BP is 192/68 HR 70. Pt has no complaints and is asymptomatic at this time. Dr Janese Banks made aware and stated that pt may be discharged. RN educated pt on signs and symptoms of when to call 911 if any symptoms occur at home. Pt verbalized understanding and stated that he can monitor his blood pressure at home. Pt also verbalized that his blood pressure medications have been recently changed by his cardiologist. Pt again states that he feels fine and was having no complaints at this time. All questions answered at this time.    Kaliope Quinonez [employer]

## 2020-06-13 MED ORDER — DOXAZOSIN MESYLATE 4 MG PO TABS: 4.0000 mg | ORAL_TABLET | Freq: Every day | ORAL | Status: DC

## 2020-06-13 NOTE — Telephone Encounter (Signed)
Called to give the patient Dr. Ends' recommendation. [email or web address]. 

## 2020-06-16 DIAGNOSIS — D649 Anemia, unspecified: Secondary | ICD-10-CM | POA: Diagnosis not present

## 2020-06-16 DIAGNOSIS — I10 Essential (primary) hypertension: Secondary | ICD-10-CM | POA: Diagnosis not present

## 2020-06-16 DIAGNOSIS — N184 Chronic kidney disease, stage 4 (severe): Secondary | ICD-10-CM | POA: Diagnosis not present

## 2020-06-20 ENCOUNTER — Encounter: Payer: Self-pay | Admitting: Family

## 2020-06-20 ENCOUNTER — Ambulatory Visit (INDEPENDENT_AMBULATORY_CARE_PROVIDER_SITE_OTHER): Payer: Medicare HMO | Admitting: Family

## 2020-06-20 ENCOUNTER — Other Ambulatory Visit: Payer: Self-pay

## 2020-06-20 VITALS — BP 128/64 | HR 77 | Ht 67.0 in | Wt 175.0 lb

## 2020-06-20 DIAGNOSIS — I440 Atrioventricular block, first degree: Secondary | ICD-10-CM | POA: Diagnosis not present

## 2020-06-20 DIAGNOSIS — I25118 Atherosclerotic heart disease of native coronary artery with other forms of angina pectoris: Secondary | ICD-10-CM

## 2020-06-20 DIAGNOSIS — I10 Essential (primary) hypertension: Secondary | ICD-10-CM | POA: Diagnosis not present

## 2020-06-20 DIAGNOSIS — E785 Hyperlipidemia, unspecified: Secondary | ICD-10-CM | POA: Diagnosis not present

## 2020-06-20 DIAGNOSIS — I5032 Chronic diastolic (congestive) heart failure: Secondary | ICD-10-CM | POA: Diagnosis not present

## 2020-06-20 MED ORDER — TORSEMIDE 20 MG PO TABS: 10.0000 mg | ORAL_TABLET | Freq: Every day | ORAL | 1 refills | Status: DC

## 2020-06-20 NOTE — Progress Notes (Signed)
Office Visit    Patient Name: Rodney Wiley Date of Encounter: 06/20/2020  Primary Care Provider:  Remi Haggard, FNP Primary Cardiologist:  Nelva Bush, MD Electrophysiologist:  None   Chief Complaint    Rodney Wiley is a 84 y.o. male with a hx of CAD s/p CABG (02/2018) aortic arch regurgitation and thoracic aortic aneurysm s/p Bentall with bioprosthetic valve replacement (02/2018), HFpEF, SVT, CKD IIIb-IV, HTN, anemia, thrombocytopenia presents today for follow-up after medication change  Past Medical History    Past Medical History:  Diagnosis Date  . Anemia   . Aortic insufficiency    a. 02/2018 s/p bioprosthetic AVR; 04/2018 Echo: EF 50-55%, Gr2 DD, Ao bioprosthesis, mean grad 64mmHg, Ao root/Asc Ao nl in size, mild MR, mildly dil LA, nl RV fxn. Nl PASP.  [name] Kitchen Arthropathy   . Ascending aortic aneurysm ([place])    a. 12/2016 MRA: 4.3cm @ sinus of valsalva, 4.9cm above sinotubular jxn; b. 02/2018 s/p biological Bentall and AVR.  [name] Kitchen BPH (benign prostatic hyperplasia)   . 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 02/2018 Cath: LAD 20ost, 40p, 22m, LCX 60p, OM2 60, OM3 85, RCA 10p/m w/ L->R and R->R collats;  b. 02/2018 CABG x 3: LIMA to LAD, SVG to OM2, SVG to PDA, EVH via right thigh.  . CKD (chronic kidney disease), stage III   . Diverticulitis   . Essential hypertension   . GERD (gastroesophageal reflux disease)   . Hemorrhoids   . History of chicken pox   . History of Helicobacter pylori infection 06/2007  . Hyperlipidemia   . Hypertension   . Left renal artery stenosis (HCC)    a. 01/2017 RA u/s: moderate L RAS.  [name] Kitchen Lower extremity edema   . Nonrheumatic mitral (valve) insufficiency    a. 12/2016 Echo: mild to mod MR; b. 09/2017 TEE: mild to mod MR; c. 04/2018 Echo: Mild MR.  [name] Kitchen Nonrheumatic pulmonary valve insufficiency   . 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 s/p 21 mm Edwards Inspiris Resilia stented  bovine pericardial tissue valve and 24 mm Gelweave Valsalva synthetic root conduit with reimplantation of left main coronary artery.   Past Surgical History:  Procedure Laterality Date  . AORTIC VALVE REPAIR N/A 02/22/2018   Procedure: AORTIC VALVE REPLACEMENT -Biological Bentall aortic root replacement,;  Surgeon: Rexene Alberts, MD;  Location: [place];  Service: Open Heart Surgery;  Laterality: N/A;  . bypass surgery    . CARDIAC CATHETERIZATION     01/16/2018  . COLONOSCOPY  2011   by Dr. Candace Cruise with findings of diverticulosis  . CORONARY ARTERY BYPASS GRAFT N/A 02/22/2018   Procedure: CORONARY ARTERY BYPASS GRAFTING (CABG) x three, using left internal mammary artery and right leg greater saphenous vein harvested endoscopically;  Surgeon: Rexene Alberts, MD;  Location: [place];  Service: Open Heart Surgery;  Laterality: N/A;  . CORONARY/GRAFT ANGIOGRAPHY N/A 01/16/2018   Procedure: CORONARY/GRAFT ANGIOGRAPHY;  Surgeon: Nelva Bush, MD;  Location: [place] CV LAB;  Service: Cardiovascular;  Laterality: N/A;  . ELBOW SURGERY Left   . hemorhoidectomy    . RIGHT HEART CATH N/A 01/16/2018   Procedure: RIGHT HEART CATH;  Surgeon: Nelva Bush, MD;  Location: [place] CV LAB;  Service: Cardiovascular;  Laterality: N/A;  . SHOULDER ARTHROSCOPY WITH ROTATOR CUFF REPAIR Right   . SHOULDER SURGERY Right   . TEE WITHOUT CARDIOVERSION N/A 09/27/2017  Procedure: TRANSESOPHAGEAL ECHOCARDIOGRAM (TEE);  Surgeon: Dorothy Spark, MD;  Location: [facility] ENDOSCOPY;  Service: Cardiovascular;  Laterality: N/A;  . TEE WITHOUT CARDIOVERSION N/A 02/22/2018   Procedure: TRANSESOPHAGEAL ECHOCARDIOGRAM (TEE);  Surgeon: Rexene Alberts, MD;  Location: [place];  Service: Open Heart Surgery;  Laterality: N/A;  . THORACIC AORTIC ANEURYSM REPAIR N/A 02/22/2018   Procedure: THORACIC ASCENDING ANEURYSM REPAIR (AAA) Resection of ascending aorta aneurysm;  Surgeon: Rexene Alberts, MD;  Location: [facility] OR;  Service: Open  Heart Surgery;  Laterality: N/A;    Allergies  No Known Allergies  History of Present Illness    Rodney Wiley is a 84 y.o. male with a hx of CAD s/p CABG (02/2018) aortic arch regurgitation and thoracic aortic aneurysm s/p Bentall with bioprosthetic valve replacement (02/2018), HFpEF, SVT, CKD IIIb-IV, HTN, anemia, thrombocytopenia last seen 05/07/2020.  Echo 04/2018 showed mild concentric hypertrophy, EF 50-27%, grade 2 diastolic dysfunction, AV p.o. basis with mean gradient 17 mmHg, mild MR.  Long-term monitor 01/2019 with predominantly sinus rhythm with average heart rate of 64 bpm with range 44-114 bpm, first-degree AV block, rare PAC with frequent PVC.  Escalation of beta-blocker was precluded by hypotension.  Renal artery duplex 02/06/19 with no evidence of significant stenosis. When seen 05/07/2020 by Dr. Saunders Revel he was recommended to transition his torsemide to 20 mg daily instead of as needed due to mild volume overload.  Is recently been receiving IV Feraheme for anemia with oncology.  06/13/2020 his Doxazosin was changed to 4 mg daily via MyChart message due to elevated blood pressures.  He was seen by his nephrologist 06/16/2020 with BP in the office 158/72.  His amlodipine was transitioned from 5 mg to 10 mg.  He was recommended to trial torsemide 10 mg instead of 20 mg due to it interfering with his quality of life.   He reports since changing his torsemide his urinary symptoms have improved. He reports his blood pressure has been well controlled since the changes to his doxazosin, hydralazine, amlodipine. His readings at home recently have been 125/67, 149/83, 131/61. He is pleased with his response to his blood pressure.  Reports no shortness of breath at rest. Reports stable dyspnea on exertion. He is able to push mow his yard for 45 minutes at a time without difficulty. Reports no chest pain, pressure, or tightness. No  orthopnea, PND. Reports no palpitations. Reports lower extremity edema is  stable at baseline is well controlled by keeping his legs elevated.  EKGs/Labs/Other Studies Reviewed:   The following studies were reviewed today:  EKG:  EKG is  ordered today.  The ekg ordered today demonstrates SR 77 bpm with 1st degree AV block, rightward axis, poor R wave progression. PR interval today 256 is increased compared to previous EKG 05/07/20 but stable compared to EKG 11/2019  Recent Labs: 08/01/2019: Magnesium 2.2 05/20/2020: ALT 16; BUN 36; Creatinine, Ser 2.16; Hemoglobin 8.9; Platelets 143; Potassium 4.1; Sodium 137  Recent Lipid Panel    Component Value Date/Time   CHOL 132 09/15/2018 1115   TRIG 214 (H) 09/15/2018 1115   HDL 33 (L) 09/15/2018 1115   CHOLHDL 4.0 09/15/2018 1115   CHOLHDL 3.8 03/11/2017 0753   VLDL 39 03/11/2017 0753   LDLCALC 56 09/15/2018 1115    Home Medications   Current Meds  Medication Sig  . amLODipine (NORVASC) 5 MG tablet TAKE ONE TABLET BY MOUTH DAILY  . aspirin EC 81 MG tablet Take 81 mg by mouth daily.  [name] Kitchen  atorvastatin (LIPITOR) 20 MG tablet TAKE ONE TABLET BY MOUTH DAILY  . carvedilol (COREG) 12.5 MG tablet Take 1 tablet (12.5 mg total) by mouth 2 (two) times daily.  . cholecalciferol (VITAMIN D3) 25 MCG (1000 UT) tablet Take 1,000 Units by mouth daily.  [name] Kitchen doxazosin (CARDURA) 4 MG tablet Take 1 tablet (4 mg total) by mouth daily.  . ferrous sulfate 325 (65 FE) MG EC tablet Take 325 mg by mouth daily with breakfast.   . hydrALAZINE (APRESOLINE) 100 MG tablet Take 1 tablet (100 mg total) by mouth 3 (three) times daily.  . isosorbide mononitrate (IMDUR) 30 MG 24 hr tablet Take 1 tablet (30 mg total) by mouth daily.  [name] Kitchen omeprazole (PRILOSEC) 20 MG capsule Take 20 mg by mouth daily.  [name] Kitchen torsemide (DEMADEX) 20 MG tablet Take 1 tablet (20 mg total) by mouth daily.    Review of Systems   Review of Systems  Constitutional: Negative for chills, fever and malaise/fatigue.  Cardiovascular: Positive for dyspnea on exertion and leg swelling.  Negative for chest pain, irregular heartbeat, near-syncope, orthopnea, palpitations and syncope.  Respiratory: Negative for cough, shortness of breath and wheezing.   Gastrointestinal: Negative for melena, nausea and vomiting.  Genitourinary: Negative for hematuria.  Neurological: Negative for dizziness, light-headedness and weakness.   All other systems reviewed and are otherwise negative except as noted above.  Physical Exam    VS:  BP 128/64 (BP Location: Left Arm, Patient Position: Sitting, Cuff Size: Normal)   Pulse 77   Ht 5\' 7"  (1.702 m)   Wt 175 lb (79.4 kg)   SpO2 98%   BMI 27.41 kg/m  , BMI Body mass index is 27.41 kg/m. GEN: Well nourished, overweight,  well developed, in no acute distress. HEENT: normal. Neck: Supple, no JVD, carotid bruits, or masses. Cardiac: RRR, no rubs, or gallops. 2/6 systolic murmur. No clubbing, cyanosis, edema.  Radials/DP/PT 2+ and equal bilaterally.  Respiratory:  Respirations regular and unlabored, clear to auscultation bilaterally. GI: Soft, nontender, nondistended. MS: No deformity or atrophy. Skin: Warm and dry, no rash. Neuro:  Strength and sensation are intact. Psych: Normal affect.  Assessment & Plan    1. Chronic HFpEF- Euvolemic on exam and well compensated. Nephrology recently reduced Torsemide to 10mg  daily due to frequent urination.   2. CAD s/p CABG - RContinue present GDMT including Coreg, torsemide. Continue low-sodium, heart healthy diet. Continue to elevate lower extremities when sitting, his edema has been well controlled.eports no anginal symptoms. GDMT includes aspirin, beta blocker, statin, Imdur. No indication for ischemic evaluation at this time.   3. First-degree AV block - Continue to monitor closely in setting Coreg 12.5mg  BID. Continue to monitor with EKG. PR interval today is mildly prolonged compared to 05/07/20 but is stable compared to 11/2019.   4. Thoracic aortic aneurysm and aortic regurgitation s/p Bentall  -mild lower extremity edema improved compared to previous. Euvolemic on exam. Blood pressure now well controlled.  5. HTN - BP well controlled. Continue current antihypertensive regimen. He is presently on Torsemide 10mg  daily, Amlodipine 10mg  daily (recently changed by nephrology) as well as Hydralazine 100mg  TID, Coreg 12.5mg  twice daily, Doxazosin 4mg  daily, imdur 30mg  daily. Continue to monitor lower extremity edema carefully as increased doses of amlodipine can cause lower extremity edema.  6. CKD IIb-IV -continue to follow nephrology. Careful titration of diuretics and antihypertensives.  7. HLD, LDL goal less than 70 - Follows with PCP. Reports recent LDL was well controlled. Continue Atorvastatin 20mg   daily.   Disposition: Follow up In September as previously scheduled with Dr. Saunders Revel or APP.   Loel Dubonnet, NP 06/20/2020, 2:23 PM

## 2020-06-20 NOTE — Patient Instructions (Addendum)
Medication Instructions:  No medication changes today.   *If you need a refill on your cardiac medications before your next appointment, please call your pharmacy*  Lab Work: No lab work today.   Testing/Procedures: Your EKG today shows sinus rhythm with a stable 1st degree AV block.    Follow-Up: At [facility], you and your health needs are our priority.  As part of our continuing mission to provide you with exceptional heart care, we have created designated Provider Care Teams.  These Care Teams include your primary Cardiologist (physician) and Advanced Practice Providers (APPs -  Physician Assistants and Nurse Practitioners) who all work together to provide you with the care you need, when you need it.  We recommend signing up for the patient portal called "MyChart".  Sign up information is provided on this After Visit Summary.  MyChart is used to connect with patients for Virtual Visits (Telemedicine).  Patients are able to view lab/test results, encounter notes, upcoming appointments, etc.  Non-urgent messages can be sent to your provider as well.   To learn more about what you can do with MyChart, go to [email or web address].    Your next appointment:   In September with Dr. Saunders Revel as previously scheduled  Other Instructions   In some people Amlodipine at higher doses can cause some leg swelling. If you notice changes in your leg swelling, please call us to let us know.    Keep up the good work with you exercise mowing the yard. Keep up the good work elevating your legs which helps with swelling.

## 2020-06-27 ENCOUNTER — Ambulatory Visit: Payer: Medicare HMO | Admitting: Family

## 2020-07-24 DIAGNOSIS — E7800 Pure hypercholesterolemia, unspecified: Secondary | ICD-10-CM | POA: Diagnosis not present

## 2020-07-24 DIAGNOSIS — N400 Enlarged prostate without lower urinary tract symptoms: Secondary | ICD-10-CM | POA: Diagnosis not present

## 2020-07-24 DIAGNOSIS — I2510 Atherosclerotic heart disease of native coronary artery without angina pectoris: Secondary | ICD-10-CM | POA: Diagnosis not present

## 2020-07-24 DIAGNOSIS — R011 Cardiac murmur, unspecified: Secondary | ICD-10-CM | POA: Diagnosis not present

## 2020-07-24 DIAGNOSIS — Z1339 Encounter for screening examination for other mental health and behavioral disorders: Secondary | ICD-10-CM | POA: Diagnosis not present

## 2020-07-24 DIAGNOSIS — I10 Essential (primary) hypertension: Secondary | ICD-10-CM | POA: Diagnosis not present

## 2020-07-24 DIAGNOSIS — E559 Vitamin D deficiency, unspecified: Secondary | ICD-10-CM | POA: Diagnosis not present

## 2020-07-24 DIAGNOSIS — I5032 Chronic diastolic (congestive) heart failure: Secondary | ICD-10-CM | POA: Diagnosis not present

## 2020-07-24 DIAGNOSIS — R7989 Other specified abnormal findings of blood chemistry: Secondary | ICD-10-CM | POA: Diagnosis not present

## 2020-07-24 DIAGNOSIS — R002 Palpitations: Secondary | ICD-10-CM | POA: Diagnosis not present

## 2020-07-24 DIAGNOSIS — Z8249 Family history of ischemic heart disease and other diseases of the circulatory system: Secondary | ICD-10-CM | POA: Diagnosis not present

## 2020-07-24 DIAGNOSIS — E785 Hyperlipidemia, unspecified: Secondary | ICD-10-CM | POA: Diagnosis not present

## 2020-07-24 DIAGNOSIS — Z79899 Other long term (current) drug therapy: Secondary | ICD-10-CM | POA: Diagnosis not present

## 2020-07-24 DIAGNOSIS — D519 Vitamin B12 deficiency anemia, unspecified: Secondary | ICD-10-CM | POA: Diagnosis not present

## 2020-07-24 DIAGNOSIS — R5383 Other fatigue: Secondary | ICD-10-CM | POA: Diagnosis not present

## 2020-07-24 DIAGNOSIS — R0602 Shortness of breath: Secondary | ICD-10-CM | POA: Diagnosis not present

## 2020-07-24 DIAGNOSIS — E6609 Other obesity due to excess calories: Secondary | ICD-10-CM | POA: Diagnosis not present

## 2020-08-08 ENCOUNTER — Other Ambulatory Visit: Payer: Self-pay | Admitting: Internal Medicine

## 2020-08-13 ENCOUNTER — Ambulatory Visit: Payer: Medicare HMO | Admitting: Internal Medicine

## 2020-08-13 ENCOUNTER — Encounter: Payer: Self-pay | Admitting: Internal Medicine

## 2020-08-13 ENCOUNTER — Other Ambulatory Visit: Payer: Self-pay

## 2020-08-13 VITALS — BP 150/70 | HR 65 | Ht 67.0 in | Wt 170.1 lb

## 2020-08-13 DIAGNOSIS — I351 Nonrheumatic aortic (valve) insufficiency: Secondary | ICD-10-CM | POA: Diagnosis not present

## 2020-08-13 DIAGNOSIS — I2510 Atherosclerotic heart disease of native coronary artery without angina pectoris: Secondary | ICD-10-CM

## 2020-08-13 DIAGNOSIS — I10 Essential (primary) hypertension: Secondary | ICD-10-CM | POA: Diagnosis not present

## 2020-08-13 DIAGNOSIS — I712 Thoracic aortic aneurysm, without rupture, unspecified: Secondary | ICD-10-CM

## 2020-08-13 DIAGNOSIS — I5032 Chronic diastolic (congestive) heart failure: Secondary | ICD-10-CM

## 2020-08-13 DIAGNOSIS — E785 Hyperlipidemia, unspecified: Secondary | ICD-10-CM

## 2020-08-13 MED ORDER — DOXAZOSIN MESYLATE 4 MG PO TABS: 4.0000 mg | ORAL_TABLET | Freq: Every day | ORAL | 0 refills | Status: DC

## 2020-08-13 MED ORDER — CARVEDILOL 12.5 MG PO TABS: 12.5000 mg | ORAL_TABLET | Freq: Two times a day (BID) | ORAL | 1 refills | Status: DC

## 2020-08-13 NOTE — Progress Notes (Signed)
Follow-up Outpatient Visit Date: 08/13/2020  Primary Care Provider: Remi Haggard, [place] [id number]  Chief Complaint: Follow-up coronary artery disease, thoracic aortic aneurysm, chronic HFpEF, and valvular heart disease  HPI:  Rodney Wiley is a 84 y.o. male with history of hypertension,coronary artery disease status post CABG (3/19)aortic regurgitationandthoracic aortic aneurysmstatus post Bentall with bioprosthetic aortic valve replacement (3/19),SVT, HFpEF,and chronic kidney disease stage 3withquestionableleft renal artery stenosis, who presents for follow-up of coronary artery disease, valvular heart disease, and hypertension.  I last saw Rodney Wiley in early June, at which time he was doing well other than some waxing and waning leg edema.  Blood pressure was suboptimally controlled.  I encouraged escalation of torsemide 20 mg daily x1 week.  He was seen for follow-up by Laurann Montana, NP, in mid July, noting that torsemide had been decreased to 10 mg daily due to frequent urination.  Blood pressure was well controlled.  No further testing or medication changes were made.  Today, Rodney Wiley reports feeling relatively well.  He is somewhat discouraged by elevated blood pressures that have been "a little wild."  He reports some readings up to 160/95.  He has intermittent leg swelling, though it has been fairly well controlled the last few days.  He is currently taking torsemide 10 mg daily as needed for edema.  He is trying to avoid salt though he does eat out at a deli as well as the [employer] several days a week.  He has noticed occasional nonexertional chest discomfort surrounding his sternotomy incision that is transient and without associated symptoms.  He has not had any exertional chest pain nor shortness of breath, palpitations, or  lightheadedness.  --------------------------------------------------------------------------------------------------  Cardiovascular History & Procedures: Cardiovascular Problems:  Thoracic aortic aneurysms/p Bentall  Aortic regurgitations/p bioprosthetic AVR  Mitral regurgitation  Questionable renal artery stenosis  Coronary artery disease s/p CABG  Supraventricular tachycardia  Risk Factors:  Known CAD, hypertension, hyperlipidemia, male gender, and age > 50  Cath/PCI:  Coronary angiography/RHC (01/16/2018): LMCA normal. LAD with sequential 20% ostial, 40% proximal, and long 70% proximal to mid stenoses. LCx with 60% proximal stenosis and 60 to 80-90% OM2 and OM3 lesions. Chronic total occlusion of the proximal/mid RCA with left-to-right collaterals. RA 9, RV 43/10, PA 37/15 (22), and PCWP 18. Fick CO/CI 5.1/2.6.  CV Surgery:  CABG and Bentall with bioprosthetic AVR (02/22/2018, Dr. Roxy Manns): LIMA to LAD, SVG to OM2, and SVG to PDA. 24 mm synthetic aortic root graft and 21 mm Edwards InspirisRoselia stented bovine pericardial tissue valve.  EP Procedures and Devices:  Event monitor (01/22/19): Patient was monitored for 2 days, 22 hours.Predominantly sinus rhythm with rare PAC's and frequent PVC's. Several brief atrial runs were noted.PVC burden 8.4%.  Non-Invasive Evaluation(s):  Renal artery Doppler (02/06/19): No evidence of significant renal artery stenosis affecting either kidney.  TTE (04/07/18): Normal LV size with mild LVH. LVEF 50 to 55% with anteroseptal and septal hypokinesis. Grade 2 diastolic dysfunction. Bioprosthetic aortic valve present with mean gradient of 17 mmHg. Mild mitral regurgitation. Normal RV size and function. No pericardial effusion.  TEE (09/27/17): Mildly dilated LV with LVEF 60-65%. Moderate aortic regurgitation. Mild to moderate mitral regurgitation. Mild right atrial enlargement. Moderate aortic dilation, with root measuring  5.0 cm and ascending aorta 4.7 cm  MRA chest (06/06/17): Stable ascending aortic aneurysm, measuring 5.0 cm atthe sinotubular junction and 4.8 cm at the level of the main pulmonary artery.  Renal artery Doppler (01/13/17):  Aortic atherosclerosis without stenosis or aneurysm. Normal right renal artery. Mild to moderate left renal artery stenosis (1-59%). Normal/symmetric kidney size.  MRA chest (12/27/16): Aneurysmal disease of the ascending aorta, measuring 4.3 cm at the sinus of Valsalva and 4.9 cm above the sinotubular junction.  TTE (12/14/16): Mildly dilated LV with mild LVH. Normal contraction with LVEF 55-60%. Grade 1 diastolic dysfunction. Mild to moderate aortic regurgitation. Dilated aorta, measuring up to 4.9 cm above the sinotubular junction. Mild to moderate mitral regurgitation. Mild left atrial enlargement. Normal RV size and function.  Transthoracic echo (09/2016, [place]): Full report not available. Reportedly showed LVH with LVEF 37% and diastolic dysfunction. MR and moderate to severe AI present with sclerotic aortic valve.  Carotid Doppler (10/201&): Full report not available. Reportedly showed 1-39% stenosis bilaterally.  Transthoracic echo (02/18/16, [facility]): Normal LV and RV size and function. LVEF >55%. Moderate AI; mild MR and TR.  Recent CV Pertinent Labs: Lab Results  Component Value Date   CHOL 132 09/15/2018   HDL 33 (L) 09/15/2018   LDLCALC 56 09/15/2018   TRIG 214 (H) 09/15/2018   CHOLHDL 4.0 09/15/2018   CHOLHDL 3.8 03/11/2017   INR 2.9 05/24/2018   INR 7.53 (HH) 03/27/2018   K 4.1 05/20/2020   MG 2.2 08/01/2019   BUN 36 (H) 05/20/2020   BUN 34 (H) 05/07/2020   CREATININE 2.16 (H) 05/20/2020    Past medical and surgical history were reviewed and updated in EPIC.  Current Meds  Medication Sig  . amLODipine (NORVASC) 10 MG tablet Take 10 mg by mouth daily.  [name] Kitchen aspirin EC 81 MG tablet Take 81 mg by mouth daily.  [name] Kitchen  atorvastatin (LIPITOR) 20 MG tablet TAKE ONE TABLET BY MOUTH DAILY  . carvedilol (COREG) 12.5 MG tablet Take 1 tablet (12.5 mg total) by mouth 2 (two) times daily.  . cholecalciferol (VITAMIN D3) 25 MCG (1000 UT) tablet Take 1,000 Units by mouth daily.  [name] Kitchen doxazosin (CARDURA) 4 MG tablet Take 1 tablet (4 mg total) by mouth daily.  . ferrous sulfate 325 (65 FE) MG EC tablet Take 325 mg by mouth daily with breakfast.   . hydrALAZINE (APRESOLINE) 100 MG tablet Take 1 tablet (100 mg total) by mouth 3 (three) times daily.  . isosorbide mononitrate (IMDUR) 30 MG 24 hr tablet Take 1 tablet (30 mg total) by mouth daily.  [name] Kitchen omeprazole (PRILOSEC) 20 MG capsule Take 20 mg by mouth daily.  [name] Kitchen torsemide (DEMADEX) 20 MG tablet Take 0.5 tablets (10 mg total) by mouth daily.    Allergies: Patient has no known allergies.  Social History   Tobacco Use  . Smoking status: Never Smoker  . Smokeless tobacco: Never Used  Vaping Use  . Vaping Use: Never used  Substance Use Topics  . Alcohol use: No  . Drug use: No    Family History  Problem Relation Age of Onset  . Hypertension Mother   . Stroke Mother   . Leukemia Father   . Heart attack Paternal Grandmother   . Stroke Paternal Grandfather   . Heart Problems Son 60  . Heart Problems Son 67  . Uterine cancer Sister   . Prostate cancer Brother   . Colon cancer Brother   . Kidney failure Brother   . Stroke Sister     Review of Systems: A 12-system review of systems was performed and was negative except as noted in the HPI.  --------------------------------------------------------------------------------------------------  Physical Exam: BP (!) 150/70 (BP  Location: Left Arm, Patient Position: Sitting, Cuff Size: Normal)   Pulse 65   Ht 5\' 7"  (1.702 m)   Wt 170 lb 2 oz (77.2 kg)   SpO2 97%   BMI 26.65 kg/m   Repeat BP 146/70  General: NAD. HEENT: No conjunctival pallor or scleral icterus. Facemask in place. Neck: Supple without  lymphadenopathy, thyromegaly, JVD, or HJR. Lungs: Normal work of breathing. Clear to auscultation bilaterally without wheezes or crackles. Heart: Regular rate and rhythm with 2/6 systolic murmur.  No rubs or gallops. Abd: Bowel sounds present. Soft, NT/ND. Ext: No lower extremity edema.  EKG: Normal sinus rhythm with first-degree AV block (PR interval 246 ms) PVCs, left axis deviation, and poor R wave progression.  Compared with prior tracing from 06/20/2020, PVCs are new.  Otherwise, there has been no significant interval change.  Lab Results  Component Value Date   WBC 5.3 05/20/2020   HGB 8.9 (L) 05/20/2020   HCT 26.0 (L) 05/20/2020   MCV 95.2 05/20/2020   PLT 143 (L) 05/20/2020    Lab Results  Component Value Date   NA 137 05/20/2020   K 4.1 05/20/2020   CL 109 05/20/2020   CO2 21 (L) 05/20/2020   BUN 36 (H) 05/20/2020   CREATININE 2.16 (H) 05/20/2020   GLUCOSE 95 05/20/2020   ALT 16 05/20/2020    Lab Results  Component Value Date   CHOL 132 09/15/2018   HDL 33 (L) 09/15/2018   LDLCALC 56 09/15/2018   TRIG 214 (H) 09/15/2018   CHOLHDL 4.0 09/15/2018    --------------------------------------------------------------------------------------------------  ASSESSMENT AND PLAN: Coronary artery disease without angina: Rodney Wiley has not had any symptoms to suggest worsening coronary insufficiency.  Transient nonexertional chest pain surrounding his sternotomy is likely musculoskeletal or neuropathic from prior sternotomy.  We have agreed to continue his current medications for secondary prevention.  Chronic HFpEF: Rodney Wiley appears euvolemic with NYHA class II symptoms.  Given his tenuous kidney function, I think it is reasonable to use torsemide on an as-needed basis.  As he reports having had a labs done through his PCPs office a few weeks ago, we will reach out to Ms. Lindley's office to review the records.  He is also scheduled for repeat blood work through Dr. Elroy Channel  office later this month in anticipation of iron infusion.  Hypertension: Blood pressure moderately elevated today and somewhat labile at home.  It appears that Rodney Wiley is not limiting his sodium intake on a regular basis.  We discussed the importance of sodium restriction and that many foods, particularly prepared at restaurants, are already loaded with sodium.  I provided him with information about the DASH diet.  We will defer medication changes today.  Hyperlipidemia: Continue high intensity statin therapy.  We will request recent labs from his Lavena Bullion.  If a lipid panel has not been performed within the last 12 months, we will plan to have one drawn when Rodney Wiley has labs done through Dr. Elroy Channel office later this month.  Chronic kidney disease: Creatinine being followed closely by his PCP and nephrology.  We will try to avoid nephrotoxic drugs.  Most recent renal artery Doppler did not suggest critical renal artery stenosis.  Thoracic aortic aneurysm and aortic regurgitation status post Bentall with bioprosthetic AVR: This appears stable.  Continue aspirin 81 mg daily and regular follow-up with cardiac surgery.  Follow-up: Return to clinic in 3 months.  Nelva Bush, MD 08/13/2020 2:16 PM

## 2020-08-13 NOTE — Patient Instructions (Signed)
Medication Instructions:  Your physician recommends that you continue on your current medications as directed. Please refer to the Current Medication list given to you today.  *If you need a refill on your cardiac medications before your next appointment, please call your pharmacy*  Lab Work: Worthy Keeler will be requested from your primary care physician.  If you have labs (blood work) drawn today and your tests are completely normal, you will receive your results only by: [name] Kitchen MyChart Message (if you have MyChart) OR . A paper copy in the mail If you have any lab test that is abnormal or we need to change your treatment, we will call you to review the results.  Testing/Procedures: NONE  Follow-Up: At [facility], you and your health needs are our priority.  As part of our continuing mission to provide you with exceptional heart care, we have created designated Provider Care Teams.  These Care Teams include your primary Cardiologist (physician) and Advanced Practice Providers (APPs -  Physician Assistants and Nurse Practitioners) who all work together to provide you with the care you need, when you need it.  We recommend signing up for the patient portal called "MyChart".  Sign up information is provided on this After Visit Summary.  MyChart is used to connect with patients for Virtual Visits (Telemedicine).  Patients are able to view lab/test results, encounter notes, upcoming appointments, etc.  Non-urgent messages can be sent to your provider as well.   To learn more about what you can do with MyChart, go to [email or web address].    Your next appointment:   3 month(s)  The format for your next appointment:   In Person  Provider:    You may see Nelva Bush, MD or one of the following Advanced Practice Providers on your designated Care Team:    Murray Hodgkins, NP  Christell Faith, PA-C  Marrianne Mood, PA-C

## 2020-08-14 ENCOUNTER — Encounter: Payer: Self-pay | Admitting: Internal Medicine

## 2020-08-15 DIAGNOSIS — D2262 Melanocytic nevi of left upper limb, including shoulder: Secondary | ICD-10-CM | POA: Diagnosis not present

## 2020-08-15 DIAGNOSIS — D2271 Melanocytic nevi of right lower limb, including hip: Secondary | ICD-10-CM | POA: Diagnosis not present

## 2020-08-15 DIAGNOSIS — L821 Other seborrheic keratosis: Secondary | ICD-10-CM | POA: Diagnosis not present

## 2020-08-15 DIAGNOSIS — L239 Allergic contact dermatitis, unspecified cause: Secondary | ICD-10-CM | POA: Diagnosis not present

## 2020-08-15 DIAGNOSIS — D225 Melanocytic nevi of trunk: Secondary | ICD-10-CM | POA: Diagnosis not present

## 2020-08-15 DIAGNOSIS — D2272 Melanocytic nevi of left lower limb, including hip: Secondary | ICD-10-CM | POA: Diagnosis not present

## 2020-08-15 DIAGNOSIS — L570 Actinic keratosis: Secondary | ICD-10-CM | POA: Diagnosis not present

## 2020-08-26 ENCOUNTER — Inpatient Hospital Stay: Payer: Medicare HMO | Attending: Oncology

## 2020-08-26 ENCOUNTER — Inpatient Hospital Stay: Payer: Medicare HMO | Admitting: Oncology

## 2020-08-26 ENCOUNTER — Other Ambulatory Visit: Payer: Self-pay

## 2020-08-26 ENCOUNTER — Encounter: Payer: Self-pay | Admitting: Oncology

## 2020-08-26 VITALS — BP 136/66 | HR 54 | Temp 96.9°F | Resp 16 | Wt 173.3 lb

## 2020-08-26 DIAGNOSIS — D631 Anemia in chronic kidney disease: Secondary | ICD-10-CM | POA: Insufficient documentation

## 2020-08-26 DIAGNOSIS — N189 Chronic kidney disease, unspecified: Secondary | ICD-10-CM

## 2020-08-26 DIAGNOSIS — I129 Hypertensive chronic kidney disease with stage 1 through stage 4 chronic kidney disease, or unspecified chronic kidney disease: Secondary | ICD-10-CM | POA: Diagnosis not present

## 2020-08-26 DIAGNOSIS — Z79899 Other long term (current) drug therapy: Secondary | ICD-10-CM | POA: Diagnosis not present

## 2020-08-26 DIAGNOSIS — D509 Iron deficiency anemia, unspecified: Secondary | ICD-10-CM

## 2020-08-26 DIAGNOSIS — D696 Thrombocytopenia, unspecified: Secondary | ICD-10-CM

## 2020-08-26 DIAGNOSIS — N183 Chronic kidney disease, stage 3 unspecified: Secondary | ICD-10-CM | POA: Insufficient documentation

## 2020-08-26 LAB — COMPREHENSIVE METABOLIC PANEL
ALT: 17 U/L (ref 0–44)
AST: 21 U/L (ref 15–41)
Albumin: 3.8 g/dL (ref 3.5–5.0)
Alkaline Phosphatase: 48 U/L (ref 38–126)
Anion gap: 8 (ref 5–15)
BUN: 37 mg/dL — ABNORMAL HIGH (ref 8–23)
CO2: 22 mmol/L (ref 22–32)
Calcium: 8.3 mg/dL — ABNORMAL LOW (ref 8.9–10.3)
Chloride: 108 mmol/L (ref 98–111)
Creatinine, Ser: 2.22 mg/dL — ABNORMAL HIGH (ref 0.61–1.24)
GFR calc Af Amer: 31 mL/min — ABNORMAL LOW (ref >60)
GFR calc non Af Amer: 27 mL/min — ABNORMAL LOW (ref >60)
Glucose, Bld: 107 mg/dL — ABNORMAL HIGH (ref 70–99)
Potassium: 3.9 mmol/L (ref 3.5–5.1)
Sodium: 138 mmol/L (ref 135–145)
Total Bilirubin: 0.8 mg/dL (ref 0.3–1.2)
Total Protein: 6.9 g/dL (ref 6.5–8.1)

## 2020-08-26 LAB — CBC WITH DIFFERENTIAL/PLATELET
Abs Immature Granulocytes: 0.01 10*3/uL (ref 0.00–0.07)
Basophils Absolute: 0 10*3/uL (ref 0.0–0.1)
Basophils Relative: 0 %
Eosinophils Absolute: 0.2 10*3/uL (ref 0.0–0.5)
Eosinophils Relative: 3 %
HCT: 29.5 % — ABNORMAL LOW (ref 39.0–52.0)
Hemoglobin: 10.3 g/dL — ABNORMAL LOW (ref 13.0–17.0)
Immature Granulocytes: 0 %
Lymphocytes Relative: 20 %
Lymphs Abs: 1.1 10*3/uL (ref 0.7–4.0)
MCH: 32.6 pg (ref 26.0–34.0)
MCHC: 34.9 g/dL (ref 30.0–36.0)
MCV: 93.4 fL (ref 80.0–100.0)
Monocytes Absolute: 0.6 10*3/uL (ref 0.1–1.0)
Monocytes Relative: 10 %
Neutro Abs: 3.8 10*3/uL (ref 1.7–7.7)
Neutrophils Relative %: 67 %
Platelets: 118 10*3/uL — ABNORMAL LOW (ref 150–400)
RBC: 3.16 MIL/uL — ABNORMAL LOW (ref 4.22–5.81)
RDW: 13.2 % (ref 11.5–15.5)
WBC: 5.7 10*3/uL (ref 4.0–10.5)
nRBC: 0 % (ref 0.0–0.2)

## 2020-08-26 LAB — VITAMIN B12: Vitamin B-12: 268 pg/mL (ref 180–914)

## 2020-08-26 LAB — TSH: TSH: 3.893 u[IU]/mL (ref 0.350–4.500)

## 2020-08-26 LAB — FOLATE: Folate: 20.3 ng/mL (ref >5.9)

## 2020-08-26 LAB — IRON AND TIBC
Iron: 78 ug/dL (ref 45–182)
Saturation Ratios: 32 % (ref 17.9–39.5)
TIBC: 242 ug/dL — ABNORMAL LOW (ref 250–450)
UIBC: 164 ug/dL

## 2020-08-26 LAB — RETICULOCYTES
Immature Retic Fract: 8.7 % (ref 2.3–15.9)
RBC.: 3.2 MIL/uL — ABNORMAL LOW (ref 4.22–5.81)
Retic Count, Absolute: 56.6 10*3/uL (ref 19.0–186.0)
Retic Ct Pct: 1.8 % (ref 0.4–3.1)

## 2020-08-26 LAB — FERRITIN: Ferritin: 241 ng/mL (ref 24–336)

## 2020-08-28 ENCOUNTER — Telehealth: Payer: Self-pay

## 2020-08-28 NOTE — Telephone Encounter (Signed)
-----   Message from Sindy Guadeloupe, MD sent at 08/28/2020  8:09 AM EDT ----- Please have him take po b12 1000 mcg daily. Levels <300

## 2020-08-28 NOTE — Telephone Encounter (Signed)
Patient made aware of results, confirmed understanding, of getting OTC B-12 1,000 mcg

## 2020-08-30 NOTE — Progress Notes (Signed)
Hematology/Oncology Consult note [facility]  [phone] [phone]  Patient Care Team: Remi Haggard, FNP as PCP - General (Family Medicine) End, Harrell Gave, MD as PCP - Cardiology (Cardiology)   Name of the patient: Rodney Wiley  [id number]  1936-09-11   Date of visit: 08/30/20  Diagnosis-anemia likely secondary to CKD and iron deficiency Thrombocytopenia  Chief complaint/ Reason for visit- routine f/u of anemia  Heme/Onc history:  patient is a 84 year old male with a past medical history significant for hypertension, hyperlipidemia, stage III CKD and history of aortic insufficiency as well as ascending aortic aneurysm. He recently underwent bioprosthetic aortic valve replacement, CABG x3 and resuspension of the left atrium on 02/22/2018.   Results of blood work in June 2021 were consistent with iron deficiency along with CKD. He received venofer in July 2021.   Interval history- he is on oral iron and tolerating it well. Has mild chronic fatigue. Denies other complaints  ECOG PS- 1 Pain scale- 0   Review of systems- Review of Systems  Constitutional: Positive for malaise/fatigue. Negative for chills, fever and weight loss.  HENT: Negative for congestion, ear discharge and nosebleeds.   Eyes: Negative for blurred vision.  Respiratory: Negative for cough, hemoptysis, sputum production, shortness of breath and wheezing.   Cardiovascular: Negative for chest pain, palpitations, orthopnea and claudication.  Gastrointestinal: Negative for abdominal pain, blood in stool, constipation, diarrhea, heartburn, melena, nausea and vomiting.  Genitourinary: Negative for dysuria, flank pain, frequency, hematuria and urgency.  Musculoskeletal: Negative for back pain, joint pain and myalgias.  Skin: Negative for rash.  Neurological: Negative for dizziness, tingling, focal weakness, seizures, weakness and headaches.  Endo/Heme/Allergies:  Does not bruise/bleed easily.  Psychiatric/Behavioral: Negative for depression and suicidal ideas. The patient does not have insomnia.       No Known Allergies   Past Medical History:  Diagnosis Date  . Anemia   . Aortic insufficiency    a. 02/2018 s/p bioprosthetic AVR; 04/2018 Echo: EF 50-55%, Gr2 DD, Ao bioprosthesis, mean grad 38mmHg, Ao root/Asc Ao nl in size, mild MR, mildly dil LA, nl RV fxn. Nl PASP.  [name] Kitchen Arthropathy   . Ascending aortic aneurysm ([place])    a. 12/2016 MRA: 4.3cm @ sinus of valsalva, 4.9cm above sinotubular jxn; b. 02/2018 s/p biological Bentall and AVR.  [name] Kitchen BPH (benign prostatic hyperplasia)   . 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 02/2018 Cath: LAD 20ost, 40p, 74m, LCX 60p, OM2 60, OM3 85, RCA 10p/m w/ L->R and R->R collats;  b. 02/2018 CABG x 3: LIMA to LAD, SVG to OM2, SVG to PDA, EVH via right thigh.  . CKD (chronic kidney disease), stage III   . Diverticulitis   . Essential hypertension   . GERD (gastroesophageal reflux disease)   . Hemorrhoids   . History of chicken pox   . History of Helicobacter pylori infection 06/2007  . Hyperlipidemia   . Hypertension   . Left renal artery stenosis (HCC)    a. 01/2017 RA u/s: moderate L RAS.  [name] Kitchen Lower extremity edema   . Nonrheumatic mitral (valve) insufficiency    a. 12/2016 Echo: mild to mod MR; b. 09/2017 TEE: mild to mod MR; c. 04/2018 Echo: Mild MR.  [name] Kitchen Nonrheumatic pulmonary valve insufficiency   . 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 s/p 21 mm Edwards Inspiris Resilia stented bovine pericardial tissue valve and 24 mm  Gelweave Valsalva synthetic root conduit with reimplantation of left main coronary artery.     Past Surgical History:  Procedure Laterality Date  . AORTIC VALVE REPAIR N/A 02/22/2018   Procedure: AORTIC VALVE REPLACEMENT -Biological Bentall aortic root replacement,;  Surgeon: Rexene Alberts, MD;  Location: [place];  Service: Open  Heart Surgery;  Laterality: N/A;  . bypass surgery    . CARDIAC CATHETERIZATION     01/16/2018  . COLONOSCOPY  2011   by Dr. Candace Cruise with findings of diverticulosis  . CORONARY ARTERY BYPASS GRAFT N/A 02/22/2018   Procedure: CORONARY ARTERY BYPASS GRAFTING (CABG) x three, using left internal mammary artery and right leg greater saphenous vein harvested endoscopically;  Surgeon: Rexene Alberts, MD;  Location: [place];  Service: Open Heart Surgery;  Laterality: N/A;  . CORONARY/GRAFT ANGIOGRAPHY N/A 01/16/2018   Procedure: CORONARY/GRAFT ANGIOGRAPHY;  Surgeon: Nelva Bush, MD;  Location: [place] CV LAB;  Service: Cardiovascular;  Laterality: N/A;  . ELBOW SURGERY Left   . hemorhoidectomy    . RIGHT HEART CATH N/A 01/16/2018   Procedure: RIGHT HEART CATH;  Surgeon: Nelva Bush, MD;  Location: [place] CV LAB;  Service: Cardiovascular;  Laterality: N/A;  . SHOULDER ARTHROSCOPY WITH ROTATOR CUFF REPAIR Right   . SHOULDER SURGERY Right   . TEE WITHOUT CARDIOVERSION N/A 09/27/2017   Procedure: TRANSESOPHAGEAL ECHOCARDIOGRAM (TEE);  Surgeon: Dorothy Spark, MD;  Location: [facility] ENDOSCOPY;  Service: Cardiovascular;  Laterality: N/A;  . TEE WITHOUT CARDIOVERSION N/A 02/22/2018   Procedure: TRANSESOPHAGEAL ECHOCARDIOGRAM (TEE);  Surgeon: Rexene Alberts, MD;  Location: [place];  Service: Open Heart Surgery;  Laterality: N/A;  . THORACIC AORTIC ANEURYSM REPAIR N/A 02/22/2018   Procedure: THORACIC ASCENDING ANEURYSM REPAIR (AAA) Resection of ascending aorta aneurysm;  Surgeon: Rexene Alberts, MD;  Location: [facility] OR;  Service: Open Heart Surgery;  Laterality: N/A;    Social History   Socioeconomic History  . Marital status: Unknown    Spouse name: Not on file  . Number of children: Not on file  . Years of education: Not on file  . Highest education level: Not on file  Occupational History  . Not on file  Tobacco Use  . Smoking status: Never Smoker  . Smokeless tobacco: Never Used  Vaping  Use  . Vaping Use: Never used  Substance and Sexual Activity  . Alcohol use: No  . Drug use: No  . Sexual activity: Not Currently  Other Topics Concern  . Not on file  Social History Narrative  . Not on file   Social Determinants of Health   Financial Resource Strain:   . Difficulty of Paying Living Expenses: Not on file  Food Insecurity:   . Worried About Charity fundraiser in the Last Year: Not on file  . Ran Out of Food in the Last Year: Not on file  Transportation Needs:   . Lack of Transportation (Medical): Not on file  . Lack of Transportation (Non-Medical): Not on file  Physical Activity:   . Days of Exercise per Week: Not on file  . Minutes of Exercise per Session: Not on file  Stress:   . Feeling of Stress : Not on file  Social Connections:   . Frequency of Communication with Friends and Family: Not on file  . Frequency of Social Gatherings with Friends and Family: Not on file  . Attends Religious Services: Not on file  . Active Member of Clubs or Organizations: Not on file  .  Attends Archivist Meetings: Not on file  . Marital Status: Not on file  Intimate Partner Violence:   . Fear of Current or Ex-Partner: Not on file  . Emotionally Abused: Not on file  . Physically Abused: Not on file  . Sexually Abused: Not on file    Family History  Problem Relation Age of Onset  . Hypertension Mother   . Stroke Mother   . Leukemia Father   . Heart attack Paternal Grandmother   . Stroke Paternal Grandfather   . Heart Problems Son 45  . Heart Problems Son 78  . Uterine cancer Sister   . Prostate cancer Brother   . Colon cancer Brother   . Kidney failure Brother   . Stroke Sister      Current Outpatient Medications:  .  amLODipine (NORVASC) 10 MG tablet, Take 10 mg by mouth daily., Disp: , Rfl:  .  aspirin EC 81 MG tablet, Take 81 mg by mouth daily., Disp: , Rfl:  .  atorvastatin (LIPITOR) 20 MG tablet, TAKE ONE TABLET BY MOUTH DAILY, Disp: 90  tablet, Rfl: 3 .  carvedilol (COREG) 12.5 MG tablet, Take 1 tablet (12.5 mg total) by mouth 2 (two) times daily. (Patient taking differently: Take 100 mg by mouth 2 (two) times daily. ), Disp: 180 tablet, Rfl: 1 .  cholecalciferol (VITAMIN D3) 25 MCG (1000 UT) tablet, Take 1,000 Units by mouth daily., Disp: , Rfl:  .  doxazosin (CARDURA) 4 MG tablet, Take 1 tablet (4 mg total) by mouth daily., Disp: 90 tablet, Rfl: 0 .  ferrous sulfate 325 (65 FE) MG EC tablet, Take 325 mg by mouth daily with breakfast. , Disp: , Rfl:  .  hydrALAZINE (APRESOLINE) 100 MG tablet, Take 1 tablet (100 mg total) by mouth 3 (three) times daily., Disp: 90 tablet, Rfl: 3 .  isosorbide mononitrate (IMDUR) 30 MG 24 hr tablet, Take 1 tablet (30 mg total) by mouth daily., Disp: 90 tablet, Rfl: 3 .  omeprazole (PRILOSEC) 20 MG capsule, Take 20 mg by mouth daily., Disp: , Rfl:  .  torsemide (DEMADEX) 20 MG tablet, Take 0.5 tablets (10 mg total) by mouth daily., Disp: 45 tablet, Rfl: 1  Physical exam:  Vitals:   08/26/20 1144  BP: 136/66  Pulse: (!) 54  Resp: 16  Temp: (!) 96.9 F (36.1 C)  TempSrc: Tympanic  SpO2: 99%  Weight: 173 lb 4.8 oz (78.6 kg)   Physical Exam Constitutional:      General: He is not in acute distress. Cardiovascular:     Rate and Rhythm: Normal rate and regular rhythm.     Heart sounds: Normal heart sounds.  Pulmonary:     Effort: Pulmonary effort is normal.     Breath sounds: Normal breath sounds.  Abdominal:     General: Bowel sounds are normal.     Palpations: Abdomen is soft.  Skin:    General: Skin is warm and dry.  Neurological:     Mental Status: He is alert and oriented to person, place, and time.      CMP Latest Ref Rng & Units 08/26/2020  Glucose 70 - 99 mg/dL 107(H)  BUN 8 - 23 mg/dL 37(H)  Creatinine 0.61 - 1.24 mg/dL 2.22(H)  Sodium 135 - 145 mmol/L 138  Potassium 3.5 - 5.1 mmol/L 3.9  Chloride 98 - 111 mmol/L 108  CO2 22 - 32 mmol/L 22  Calcium 8.9 - 10.3 mg/dL  8.3(L)  Total Protein 6.5 -  8.1 g/dL 6.9  Total Bilirubin 0.3 - 1.2 mg/dL 0.8  Alkaline Phos 38 - 126 U/L 48  AST 15 - 41 U/L 21  ALT 0 - 44 U/L 17   CBC Latest Ref Rng & Units 08/26/2020  WBC 4.0 - 10.5 K/uL 5.7  Hemoglobin 13.0 - 17.0 g/dL 10.3(L)  Hematocrit 39 - 52 % 29.5(L)  Platelets 150 - 400 K/uL 118(L)      Assessment and plan- Patient is a 83 y.o. male with anemia of CKD andf iron deficiency here for routine f/u  Patient received IV iron in July 2021. Hb has gone up from 8.9 to 10.3. He also has a component of anemia of CKD but given that anemia has improved to >10, will hold off on starting EPO at this time  Cbc ferritin and iron studies in 2 and 4 months. I will see him in 4 months   Visit Diagnosis 1. Anemia in chronic kidney disease, unspecified CKD stage      Dr. Randa Evens, MD, MPH [facility] at [facility] [phone] 08/30/2020 9:45 AM

## 2020-09-14 NOTE — Progress Notes (Signed)
09/15/2020 9:48 AM   Rodney Hora [place]tsel 18-May-1936 [id number]  Referring provider: Remi Haggard, [address] [place],  [place] [id number] Chief Complaint  Patient presents with  . Elevated PSA    HPI: Rodney Wiley is a 84 y.o. male who presents today for evaluation and management of elevated PSA.   -Labs on 04/23/2020 PSA 7.23, PSA free was 2.360, and % free PSA was 33%.  Repeat PSA 07/2020 7.57 -Saw Dr. Yves Dill several years ago for prostate enlargement -On doxazosin -No previous prostate biopsy -No bothersome LUTS -Denies hematuria or dysuria.  -Denies flank, abdominal or pelvic pain  PSA trend: 04/23/2020: 7.23 07/25/2020: 7.11   PMH: Past Medical History:  Diagnosis Date  . Anemia   . Aortic insufficiency    a. 02/2018 s/p bioprosthetic AVR; 04/2018 Echo: EF 50-55%, Gr2 DD, Ao bioprosthesis, mean grad 30mmHg, Ao root/Asc Ao nl in size, mild MR, mildly dil LA, nl RV fxn. Nl PASP.  [name] Kitchen Arthropathy   . Ascending aortic aneurysm ([place])    a. 12/2016 MRA: 4.3cm @ sinus of valsalva, 4.9cm above sinotubular jxn; b. 02/2018 s/p biological Bentall and AVR.  [name] Kitchen BPH (benign prostatic hyperplasia)   . 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 02/2018 Cath: LAD 20ost, 40p, 52m, LCX 60p, OM2 60, OM3 85, RCA 10p/m w/ L->R and R->R collats;  b. 02/2018 CABG x 3: LIMA to LAD, SVG to OM2, SVG to PDA, EVH via right thigh.  . CKD (chronic kidney disease), stage III ([place])   . Diverticulitis   . Essential hypertension   . GERD (gastroesophageal reflux disease)   . Hemorrhoids   . History of chicken pox   . History of Helicobacter pylori infection 06/2007  . Hyperlipidemia   . Hypertension   . Left renal artery stenosis (HCC)    a. 01/2017 RA u/s: moderate L RAS.  [name] Kitchen Lower extremity edema   . Nonrheumatic mitral (valve) insufficiency    a. 12/2016 Echo: mild to mod MR; b. 09/2017 TEE: mild to mod MR; c. 04/2018 Echo: Mild MR.  [name] Kitchen Nonrheumatic pulmonary valve insufficiency   . S/P biological  Bentall aortic root replacement with bioprosthetic valve and synthetic root conduit 02/22/2018   a. s/p 21 mm Edwards Inspiris Resilia stented bovine pericardial tissue valve and 24 mm Gelweave Valsalva synthetic root conduit with reimplantation of left main coronary artery.    Surgical History: Past Surgical History:  Procedure Laterality Date  . AORTIC VALVE REPAIR N/A 02/22/2018   Procedure: AORTIC VALVE REPLACEMENT -Biological Bentall aortic root replacement,;  Surgeon: Rexene Alberts, MD;  Location: [place];  Service: Open Heart Surgery;  Laterality: N/A;  . bypass surgery    . CARDIAC CATHETERIZATION     01/16/2018  . COLONOSCOPY  2011   by Dr. Candace Cruise with findings of diverticulosis  . CORONARY ARTERY BYPASS GRAFT N/A 02/22/2018   Procedure: CORONARY ARTERY BYPASS GRAFTING (CABG) x three, using left internal mammary artery and right leg greater saphenous vein harvested endoscopically;  Surgeon: Rexene Alberts, MD;  Location: [place];  Service: Open Heart Surgery;  Laterality: N/A;  . CORONARY/GRAFT ANGIOGRAPHY N/A 01/16/2018   Procedure: CORONARY/GRAFT ANGIOGRAPHY;  Surgeon: Nelva Bush, MD;  Location: [place] CV LAB;  Service: Cardiovascular;  Laterality: N/A;  . ELBOW SURGERY Left   . hemorhoidectomy    . RIGHT HEART CATH N/A 01/16/2018   Procedure: RIGHT HEART CATH;  Surgeon: Nelva Bush, MD;  Location: [place] CV  LAB;  Service: Cardiovascular;  Laterality: N/A;  . SHOULDER ARTHROSCOPY WITH ROTATOR CUFF REPAIR Right   . SHOULDER SURGERY Right   . TEE WITHOUT CARDIOVERSION N/A 09/27/2017   Procedure: TRANSESOPHAGEAL ECHOCARDIOGRAM (TEE);  Surgeon: Dorothy Spark, MD;  Location: [facility] ENDOSCOPY;  Service: Cardiovascular;  Laterality: N/A;  . TEE WITHOUT CARDIOVERSION N/A 02/22/2018   Procedure: TRANSESOPHAGEAL ECHOCARDIOGRAM (TEE);  Surgeon: Rexene Alberts, MD;  Location: [place];  Service: Open Heart Surgery;  Laterality: N/A;  . THORACIC AORTIC ANEURYSM REPAIR N/A  02/22/2018   Procedure: THORACIC ASCENDING ANEURYSM REPAIR (AAA) Resection of ascending aorta aneurysm;  Surgeon: Rexene Alberts, MD;  Location: [facility] OR;  Service: Open Heart Surgery;  Laterality: N/A;    Home Medications:  Allergies as of 09/15/2020   No Known Allergies     Medication List       Accurate as of September 15, 2020  9:48 AM. If you have any questions, ask your nurse or doctor.        amLODipine 10 MG tablet Commonly known as: NORVASC Take 10 mg by mouth daily.   aspirin EC 81 MG tablet Take 81 mg by mouth daily.   atorvastatin 20 MG tablet Commonly known as: LIPITOR TAKE ONE TABLET BY MOUTH DAILY   carvedilol 12.5 MG tablet Commonly known as: COREG Take 1 tablet (12.5 mg total) by mouth 2 (two) times daily. What changed: how much to take   cholecalciferol 25 MCG (1000 UNIT) tablet Commonly known as: VITAMIN D3 Take 1,000 Units by mouth daily.   doxazosin 4 MG tablet Commonly known as: CARDURA Take 1 tablet (4 mg total) by mouth daily.   ferrous sulfate 325 (65 FE) MG EC tablet Take 325 mg by mouth daily with breakfast.   hydrALAZINE 100 MG tablet Commonly known as: APRESOLINE Take 1 tablet (100 mg total) by mouth 3 (three) times daily.   isosorbide mononitrate 30 MG 24 hr tablet Commonly known as: IMDUR Take 1 tablet (30 mg total) by mouth daily.   omeprazole 20 MG capsule Commonly known as: PRILOSEC Take 20 mg by mouth daily.   torsemide 20 MG tablet Commonly known as: DEMADEX Take 0.5 tablets (10 mg total) by mouth daily.       Allergies: No Known Allergies  Family History: Family History  Problem Relation Age of Onset  . Hypertension Mother   . Stroke Mother   . Leukemia Father   . Heart attack Paternal Grandmother   . Stroke Paternal Grandfather   . Heart Problems Son 74  . Heart Problems Son 12  . Uterine cancer Sister   . Prostate cancer Brother   . Colon cancer Brother   . Kidney failure Brother   . Stroke Sister      Social History:  reports that he has never smoked. He has never used smokeless tobacco. He reports that he does not drink alcohol and does not use drugs.   Physical Exam: BP 128/72   Pulse 77   Ht 5\' 7"  (1.702 m)   Wt 175 lb (79.4 kg)   BMI 27.41 kg/m   Constitutional:  Alert and oriented, No acute distress. HEENT: [place] AT, moist mucus membranes.  Trachea midline, no masses. Cardiovascular: No clubbing, cyanosis, or edema. Respiratory: Normal respiratory effort, no increased work of breathing. GI: Abdomen is soft, nontender, nondistended, no abdominal masses GU: 60+ cc prostate, smooth no nodules.  Lymph: No cervical or inguinal lymphadenopathy. Rectal: Normal sphincter tone Skin: No rashes, bruises  or suspicious lesions. Neurologic: Grossly intact, no focal deficits, moving all 4 extremities. Psychiatric: Normal mood and affect.   Assessment & Plan:    1. Elevated PSA  Although PSA is a prostate cancer screening test he was informed that cancer is not the most common cause of an elevated PSA. Other potential causes including BPH and inflammation were discussed. He was informed that the only way to adequately diagnose prostate cancer would be a transrectal ultrasound and biopsy of the prostate. The procedure was discussed including potential risks of bleeding and infection/sepsis. He was also informed that a negative biopsy does not conclusively rule out the possibility that prostate cancer may be present and that continued monitoring is required. The use of newer adjunctive blood tests including PHI and 4kScore were discussed. The use of multiparametric prostate MRI was also discussed however is not typically used for initial evaluation of an elevated PSA. Continued periodic surveillance was also discussed.  We also discussed that approximately 50% of men in the 80s will have low-grade prostate cancer.  His percentage of free PSA is more indicative of BPH  Patient elected  surveillance and will see him back in 6 months with a [place] [address], [place], [place] [address] [phone]  I, Selena Batten, am acting as a scribe for Dr. Nicki Reaper C. Emalea Mix,  I have reviewed the above documentation for accuracy and completeness, and I agree with the above.    Abbie Sons, MD

## 2020-09-15 ENCOUNTER — Encounter: Payer: Self-pay | Admitting: Urology

## 2020-09-15 ENCOUNTER — Other Ambulatory Visit: Payer: Self-pay

## 2020-09-15 ENCOUNTER — Ambulatory Visit: Payer: Medicare HMO | Admitting: Urology

## 2020-09-15 VITALS — BP 128/72 | HR 77 | Ht 67.0 in | Wt 175.0 lb

## 2020-09-15 DIAGNOSIS — R972 Elevated prostate specific antigen [PSA]: Secondary | ICD-10-CM

## 2020-09-15 DIAGNOSIS — N400 Enlarged prostate without lower urinary tract symptoms: Secondary | ICD-10-CM | POA: Diagnosis not present

## 2020-10-22 ENCOUNTER — Inpatient Hospital Stay: Payer: Medicare HMO | Attending: Oncology

## 2020-10-22 DIAGNOSIS — D631 Anemia in chronic kidney disease: Secondary | ICD-10-CM | POA: Insufficient documentation

## 2020-10-22 DIAGNOSIS — N189 Chronic kidney disease, unspecified: Secondary | ICD-10-CM | POA: Diagnosis not present

## 2020-10-22 LAB — CBC WITH DIFFERENTIAL/PLATELET
Abs Immature Granulocytes: 0.02 10*3/uL (ref 0.00–0.07)
Basophils Absolute: 0 10*3/uL (ref 0.0–0.1)
Basophils Relative: 0 %
Eosinophils Absolute: 0.2 10*3/uL (ref 0.0–0.5)
Eosinophils Relative: 3 %
HCT: 29.3 % — ABNORMAL LOW (ref 39.0–52.0)
Hemoglobin: 10 g/dL — ABNORMAL LOW (ref 13.0–17.0)
Immature Granulocytes: 0 %
Lymphocytes Relative: 13 %
Lymphs Abs: 0.9 10*3/uL (ref 0.7–4.0)
MCH: 32.8 pg (ref 26.0–34.0)
MCHC: 34.1 g/dL (ref 30.0–36.0)
MCV: 96.1 fL (ref 80.0–100.0)
Monocytes Absolute: 0.7 10*3/uL (ref 0.1–1.0)
Monocytes Relative: 10 %
Neutro Abs: 5.2 10*3/uL (ref 1.7–7.7)
Neutrophils Relative %: 74 %
Platelets: 116 10*3/uL — ABNORMAL LOW (ref 150–400)
RBC: 3.05 MIL/uL — ABNORMAL LOW (ref 4.22–5.81)
RDW: 13.5 % (ref 11.5–15.5)
WBC: 7 10*3/uL (ref 4.0–10.5)
nRBC: 0 % (ref 0.0–0.2)

## 2020-10-22 LAB — IRON AND TIBC
Iron: 75 ug/dL (ref 45–182)
Saturation Ratios: 29 % (ref 17.9–39.5)
TIBC: 259 ug/dL (ref 250–450)
UIBC: 184 ug/dL

## 2020-10-22 LAB — FERRITIN: Ferritin: 277 ng/mL (ref 24–336)

## 2020-10-27 DIAGNOSIS — Z23 Encounter for immunization: Secondary | ICD-10-CM | POA: Diagnosis not present

## 2020-10-27 DIAGNOSIS — I5032 Chronic diastolic (congestive) heart failure: Secondary | ICD-10-CM | POA: Diagnosis not present

## 2020-10-27 DIAGNOSIS — D519 Vitamin B12 deficiency anemia, unspecified: Secondary | ICD-10-CM | POA: Diagnosis not present

## 2020-10-27 DIAGNOSIS — E7800 Pure hypercholesterolemia, unspecified: Secondary | ICD-10-CM | POA: Diagnosis not present

## 2020-10-27 DIAGNOSIS — I10 Essential (primary) hypertension: Secondary | ICD-10-CM | POA: Diagnosis not present

## 2020-10-27 DIAGNOSIS — D649 Anemia, unspecified: Secondary | ICD-10-CM | POA: Diagnosis not present

## 2020-10-27 DIAGNOSIS — E559 Vitamin D deficiency, unspecified: Secondary | ICD-10-CM | POA: Diagnosis not present

## 2020-10-28 DIAGNOSIS — G608 Other hereditary and idiopathic neuropathies: Secondary | ICD-10-CM | POA: Diagnosis not present

## 2020-10-28 DIAGNOSIS — M79661 Pain in right lower leg: Secondary | ICD-10-CM | POA: Diagnosis not present

## 2020-10-28 DIAGNOSIS — G603 Idiopathic progressive neuropathy: Secondary | ICD-10-CM | POA: Diagnosis not present

## 2020-10-29 ENCOUNTER — Other Ambulatory Visit: Payer: Self-pay | Admitting: Internal Medicine

## 2020-10-29 NOTE — Telephone Encounter (Signed)
Rx request sent to pharmacy.  

## 2020-11-12 ENCOUNTER — Other Ambulatory Visit: Payer: Self-pay

## 2020-11-12 ENCOUNTER — Ambulatory Visit (INDEPENDENT_AMBULATORY_CARE_PROVIDER_SITE_OTHER): Payer: Medicare HMO | Admitting: Internal Medicine

## 2020-11-12 ENCOUNTER — Other Ambulatory Visit: Payer: Self-pay | Admitting: Internal Medicine

## 2020-11-12 ENCOUNTER — Encounter: Payer: Self-pay | Admitting: Internal Medicine

## 2020-11-12 VITALS — BP 120/66 | HR 56 | Ht 67.0 in | Wt 171.0 lb

## 2020-11-12 DIAGNOSIS — E785 Hyperlipidemia, unspecified: Secondary | ICD-10-CM | POA: Diagnosis not present

## 2020-11-12 DIAGNOSIS — I10 Essential (primary) hypertension: Secondary | ICD-10-CM | POA: Diagnosis not present

## 2020-11-12 DIAGNOSIS — I5032 Chronic diastolic (congestive) heart failure: Secondary | ICD-10-CM

## 2020-11-12 DIAGNOSIS — I2510 Atherosclerotic heart disease of native coronary artery without angina pectoris: Secondary | ICD-10-CM

## 2020-11-12 DIAGNOSIS — I25119 Atherosclerotic heart disease of native coronary artery with unspecified angina pectoris: Secondary | ICD-10-CM | POA: Diagnosis not present

## 2020-11-12 DIAGNOSIS — I440 Atrioventricular block, first degree: Secondary | ICD-10-CM | POA: Diagnosis not present

## 2020-11-12 DIAGNOSIS — N184 Chronic kidney disease, stage 4 (severe): Secondary | ICD-10-CM | POA: Diagnosis not present

## 2020-11-12 MED ORDER — NITROGLYCERIN 0.4 MG SL SUBL: 0.4000 mg | SUBLINGUAL_TABLET | SUBLINGUAL | 2 refills | Status: DC | PRN

## 2020-11-12 NOTE — Telephone Encounter (Signed)
Rx request sent to pharmacy.  

## 2020-11-12 NOTE — Progress Notes (Signed)
Follow-up Outpatient Visit Date: 11/12/2020  Primary Care Provider: Remi Haggard, [place] [address]  Chief Complaint: Follow-up CAD, valvular heart disease, thoracic aortic aneurysm, and hypertension  HPI:  Rodney Wiley is a 84 y.o. male with history of hypertension,coronary artery disease status post CABG (3/19)aortic regurgitationandthoracic aortic aneurysmstatus post Bentall with bioprosthetic aortic valve replacement (3/19),SVT, HFpEF,and chronic kidney disease stage 3withquestionableleft renal artery stenosis, who presents for follow-up of coronary artery disease, valvular heart disease, and hypertension.  I last saw Rodney Wiley in September, at which time he was feeling relatively well, though he was discouraged by labile blood pressure.  He noted intermittent edema, for which he was using torsemide 10 mg daily as needed.  Rodney Wiley reported increased sodium intake, which we discussed at length.  We deferred medication changes in favor of lifestyle modifications.  Today, Rodney Wiley reports feeling relatively well.  He notes occasional left upper chest pain that occurs randomly and feels as though his chest is "trying to expand."  The pain is not exertional and resolves when applies light pressure with his right hand.  It has been present intermittently for the last year and does not seem to be getting worse.  He denies shortness of breath, palpitations, and lightheadedness.  He has mild leg edema that is controlled with as needed torsemide.  Home BP's have been better since our last visit.  Rodney Wiley is trying to watch his sodium intake.  He is tolerating his medications well.  --------------------------------------------------------------------------------------------------  Cardiovascular History & Procedures: Cardiovascular Problems:  Thoracic aortic aneurysms/p Bentall  Aortic regurgitations/p bioprosthetic AVR  Mitral  regurgitation  Questionable renal artery stenosis  Coronary artery disease s/p CABG  Supraventricular tachycardia  Risk Factors:  Known CAD, hypertension, hyperlipidemia, male gender, and age > 20  Cath/PCI:  Coronary angiography/RHC (01/16/2018): LMCA normal. LAD with sequential 20% ostial, 40% proximal, and long 70% proximal to mid stenoses. LCx with 60% proximal stenosis and 60 to 80-90% OM2 and OM3 lesions. Chronic total occlusion of the proximal/mid RCA with left-to-right collaterals. RA 9, RV 43/10, PA 37/15 (22), and PCWP 18. Fick CO/CI 5.1/2.6.  CV Surgery:  CABG and Bentall with bioprosthetic AVR (02/22/2018, Dr. Roxy Manns): LIMA to LAD, SVG to OM2, and SVG to PDA. 24 mm synthetic aortic root graft and 21 mm Edwards InspirisRoselia stented bovine pericardial tissue valve.  EP Procedures and Devices:  Event monitor (01/22/19): Patient was monitored for 2 days, 22 hours.Predominantly sinus rhythm with rare PAC's and frequent PVC's. Several brief atrial runs were noted.PVC burden 8.4%.  Non-Invasive Evaluation(s):  Renal artery Doppler (02/06/19): No evidence of significant renal artery stenosis affecting either kidney.  TTE (04/07/18): Normal LV size with mild LVH. LVEF 50 to 55% with anteroseptal and septal hypokinesis. Grade 2 diastolic dysfunction. Bioprosthetic aortic valve present with mean gradient of 17 mmHg. Mild mitral regurgitation. Normal RV size and function. No pericardial effusion.  TEE (09/27/17): Mildly dilated LV with LVEF 60-65%. Moderate aortic regurgitation. Mild to moderate mitral regurgitation. Mild right atrial enlargement. Moderate aortic dilation, with root measuring 5.0 cm and ascending aorta 4.7 cm  MRA chest (06/06/17): Stable ascending aortic aneurysm, measuring 5.0 cm atthe sinotubular junction and 4.8 cm at the level of the main pulmonary artery.  Renal artery Doppler (01/13/17): Aortic atherosclerosis without stenosis or aneurysm.  Normal right renal artery. Mild to moderate left renal artery stenosis (1-59%). Normal/symmetric kidney size.  MRA chest (12/27/16): Aneurysmal disease of the ascending aorta, measuring 4.3  cm at the sinus of Valsalva and 4.9 cm above the sinotubular junction.  TTE (12/14/16): Mildly dilated LV with mild LVH. Normal contraction with LVEF 55-60%. Grade 1 diastolic dysfunction. Mild to moderate aortic regurgitation. Dilated aorta, measuring up to 4.9 cm above the sinotubular junction. Mild to moderate mitral regurgitation. Mild left atrial enlargement. Normal RV size and function.  Transthoracic echo (09/2016, [place]): Full report not available. Reportedly showed LVH with LVEF 67% and diastolic dysfunction. MR and moderate to severe AI present with sclerotic aortic valve.  Carotid Doppler (10/201&): Full report not available. Reportedly showed 1-39% stenosis bilaterally.  Transthoracic echo (02/18/16, [facility]): Normal LV and RV size and function. LVEF >55%. Moderate AI; mild MR and TR.  Recent CV Pertinent Labs: Lab Results  Component Value Date   CHOL 132 09/15/2018   HDL 33 (L) 09/15/2018   LDLCALC 56 09/15/2018   TRIG 214 (H) 09/15/2018   CHOLHDL 4.0 09/15/2018   CHOLHDL 3.8 03/11/2017   INR 2.9 05/24/2018   INR 7.53 (HH) 03/27/2018   K 3.9 08/26/2020   MG 2.2 08/01/2019   BUN 37 (H) 08/26/2020   BUN 34 (H) 05/07/2020   CREATININE 2.22 (H) 08/26/2020    Past medical and surgical history were reviewed and updated in EPIC.  Current Meds  Medication Sig  . amLODipine (NORVASC) 10 MG tablet Take 10 mg by mouth daily.  [name] Kitchen ascorbic acid (VITAMIN C) 500 MG tablet Take by mouth.  [name] Kitchen aspirin EC 81 MG tablet Take 81 mg by mouth daily.  [name] Kitchen atorvastatin (LIPITOR) 20 MG tablet TAKE ONE TABLET BY MOUTH DAILY  . carvedilol (COREG) 12.5 MG tablet Take 1 tablet (12.5 mg total) by mouth 2 (two) times daily. (Patient taking differently: Take 12.5 mg by mouth 2 (two)  times daily. )  . cholecalciferol (VITAMIN D3) 25 MCG (1000 UT) tablet Take 1,000 Units by mouth daily.  [name] Kitchen doxazosin (CARDURA) 4 MG tablet Take 1 tablet (4 mg total) by mouth daily.  [name] Kitchen FARXIGA 10 MG TABS tablet Take 10 mg by mouth daily.  . ferrous sulfate 325 (65 FE) MG EC tablet Take 325 mg by mouth daily with breakfast.   . hydrALAZINE (APRESOLINE) 100 MG tablet TAKE 1 TABLET BY MOUTH THREE TIMES A DAY  . isosorbide mononitrate (IMDUR) 30 MG 24 hr tablet Take 1 tablet (30 mg total) by mouth daily.  [name] Kitchen omeprazole (PRILOSEC) 20 MG capsule Take 20 mg by mouth daily.  [name] Kitchen torsemide (DEMADEX) 20 MG tablet Take 0.5 tablets (10 mg total) by mouth daily.  . [DISCONTINUED] carvedilol (COREG) 6.25 MG tablet Take 6.25 mg by mouth 2 (two) times daily.  . [DISCONTINUED] doxazosin (CARDURA) 2 MG tablet Take by mouth.    Allergies: Patient has no known allergies.  Social History   Tobacco Use  . Smoking status: Never Smoker  . Smokeless tobacco: Never Used  Vaping Use  . Vaping Use: Never used  Substance Use Topics  . Alcohol use: No  . Drug use: No    Family History  Problem Relation Age of Onset  . Hypertension Mother   . Stroke Mother   . Leukemia Father   . Heart attack Paternal Grandmother   . Stroke Paternal Grandfather   . Heart Problems Son 2  . Heart Problems Son 65  . Uterine cancer Sister   . Prostate cancer Brother   . Colon cancer Brother   . Kidney failure Brother   . Stroke Sister  Review of Systems: A 12-system review of systems was performed and was negative except as noted in the HPI.  --------------------------------------------------------------------------------------------------  Physical Exam: BP 120/66 (BP Location: Left Arm, Patient Position: Sitting, Cuff Size: Normal)   Pulse (!) 56   Ht 5\' 7"  (1.702 m)   Wt 171 lb (77.6 kg)   SpO2 98%   BMI 26.78 kg/m   General:  NAD. HEENT: No conjunctival pallor or scleral icterus. Facemask in place. Neck:  Supple without lymphadenopathy, thyromegaly, JVD, or HJR. Lungs: Normal work of breathing. Clear to auscultation bilaterally without wheezes or crackles. Heart: Bradycardic but regular with 2/6 systolic murmur.  No rubs or gallops. Abd: Bowel sounds present. Soft, NT/ND without hepatosplenomegaly Ext: 1+ distal calf edema bilaterally.  EKG:  Sinus bradycardia with 1st degree AV block, PAC's and PVC, left axis deviation, and poor R wave progression.  PR interval has lengthened from prior tracing on 08/13/2020, though PR interval was underestimated on prior tracing.  Lab Results  Component Value Date   WBC 7.0 10/22/2020   HGB 10.0 (L) 10/22/2020   HCT 29.3 (L) 10/22/2020   MCV 96.1 10/22/2020   PLT 116 (L) 10/22/2020    Lab Results  Component Value Date   NA 138 08/26/2020   K 3.9 08/26/2020   CL 108 08/26/2020   CO2 22 08/26/2020   BUN 37 (H) 08/26/2020   CREATININE 2.22 (H) 08/26/2020   GLUCOSE 107 (H) 08/26/2020   ALT 17 08/26/2020    Lab Results  Component Value Date   CHOL 132 09/15/2018   HDL 33 (L) 09/15/2018   LDLCALC 56 09/15/2018   TRIG 214 (H) 09/15/2018   CHOLHDL 4.0 09/15/2018    --------------------------------------------------------------------------------------------------  ASSESSMENT AND PLAN: Coronary artery disease and atypical chest pain: Rodney Wiley reports intermittent atypical chest pain over the last year.  It does not sound anginal.  It may be MSK or related to prior cardiac surgery.  We discussed further evaluation with MPI but have agreed to defer this.  We will continue his current regimen of carvedilol and isosorbide mononitrate, though we will need to follow his PR interval closely in the setting of chronic 1st degree AV block and beta-blocker use.  I will provide Rodney Wiley with a prescription for sublingual nitroglycerin to be used as needed for chest pain.  Continue aspirin and statin therapy for secondary prevention.  Thoracic aortic  aneurysm and aortic regurgitation: Doing well s/p Bentall.  Continue aspirin and SBE prophylaxis.  Continue BP control and secondary prevention with statin therapy.  Chronic HFpEF: Lower extremity edema has remained stable, controlled with as needed torsemide.  We will defer medication changes today.  First degree AV block: PR interval ~340 ms today, slightly longer than at prior visit in September.  Rodney Wiley denies sx of high grade AV block.  Given his cardiomyopathy and difficult to control hypertension, I would like to continue beta-blocker therapy if possible.  We will need to monitor his PR interval closely.  If it continues to lengthen, we will need to consider decreasing or carvedilol altogether.  Hypertension: Blood pressure well-controlled today.  Continue current medications and lifestyle modifications.  Continue current dose of atorvastatin; lifestyle modifications to help improve triglycerides.  Chronic kidney disease stage IV: Renal function stable on last check in 08/2020.  Continue close follow-up with nephrology.  We will request most recent labs from his PCP.  Hyperlipidemia: Request most recent lipid panel from his PCP.  Continue atorvastatin for goal LDL <  20.  Follow-up: Return to clinic in 3 months.  Nelva Bush, MD 11/12/2020 11:41 AM

## 2020-11-12 NOTE — Patient Instructions (Signed)
Medication Instructions:  Your physician recommends that you continue on your current medications as directed. Please refer to the Current Medication list given to you today.  Nitroglycerin as needed for chest pain - Dissolve 1 tablet (0.4 mg) under your tongue every 5 minutes as needed for chest pain. Do not take more than 3 doses. If chest pain does not resolve, then call 911 or go to the Emergency Room.   *If you need a refill on your cardiac medications before your next appointment, please call your pharmacy*   Lab Work: Worthy Keeler will be requested from your primary care physician.  If you have labs (blood work) drawn today and your tests are completely normal, you will receive your results only by: [name] Kitchen MyChart Message (if you have MyChart) OR . A paper copy in the mail If you have any lab test that is abnormal or we need to change your treatment, we will call you to review the results.  Follow-Up: At [facility], you and your health needs are our priority.  As part of our continuing mission to provide you with exceptional heart care, we have created designated Provider Care Teams.  These Care Teams include your primary Cardiologist (physician) and Advanced Practice Providers (APPs -  Physician Assistants and Nurse Practitioners) who all work together to provide you with the care you need, when you need it.  We recommend signing up for the patient portal called "MyChart".  Sign up information is provided on this After Visit Summary.  MyChart is used to connect with patients for Virtual Visits (Telemedicine).  Patients are able to view lab/test results, encounter notes, upcoming appointments, etc.  Non-urgent messages can be sent to your provider as well.   To learn more about what you can do with MyChart, go to [email or web address].    Your next appointment:   3 month(s)  The format for your next appointment:   In Person  Provider:   You may see Nelva Bush, MD or one of the  following Advanced Practice Providers on your designated Care Team:    Murray Hodgkins, NP  Christell Faith, PA-C  Marrianne Mood, PA-C  [place], [place]  Laurann [place], NP

## 2020-11-13 DIAGNOSIS — N184 Chronic kidney disease, stage 4 (severe): Secondary | ICD-10-CM | POA: Insufficient documentation

## 2020-11-20 DIAGNOSIS — I10 Essential (primary) hypertension: Secondary | ICD-10-CM | POA: Diagnosis not present

## 2020-11-20 DIAGNOSIS — N184 Chronic kidney disease, stage 4 (severe): Secondary | ICD-10-CM | POA: Diagnosis not present

## 2020-11-24 DIAGNOSIS — D649 Anemia, unspecified: Secondary | ICD-10-CM | POA: Diagnosis not present

## 2020-11-24 DIAGNOSIS — N1832 Chronic kidney disease, stage 3b: Secondary | ICD-10-CM | POA: Diagnosis not present

## 2020-11-24 DIAGNOSIS — I10 Essential (primary) hypertension: Secondary | ICD-10-CM | POA: Diagnosis not present

## 2020-12-12 ENCOUNTER — Other Ambulatory Visit: Payer: Self-pay | Admitting: Internal Medicine

## 2020-12-15 NOTE — Telephone Encounter (Signed)
Rx request sent to pharmacy.  

## 2020-12-17 ENCOUNTER — Telehealth: Payer: Self-pay

## 2020-12-17 MED ORDER — HYDRALAZINE HCL 100 MG PO TABS: 100.0000 mg | ORAL_TABLET | Freq: Three times a day (TID) | ORAL | 2 refills | Status: DC

## 2020-12-17 NOTE — Telephone Encounter (Signed)
Refilled patients Hydralazine to new pharmacy as requested.  Sonnen, Chaney Born, MD 1 hour ago (2:29 PM)   I recently switched my prescriptions to [facility] [place]. I am having a problem filling my hydralazine 100 mg. They gave me 25 mg and said that is all they have on file. I even took the bottle in to show them and they still gave me 25 mg. What do I need to do?

## 2020-12-26 ENCOUNTER — Inpatient Hospital Stay: Payer: Medicare HMO | Attending: Oncology

## 2020-12-26 ENCOUNTER — Encounter: Payer: Self-pay | Admitting: Oncology

## 2020-12-26 ENCOUNTER — Inpatient Hospital Stay: Payer: Medicare HMO | Admitting: Oncology

## 2020-12-26 VITALS — BP 125/70 | HR 59 | Temp 97.5°F | Resp 16 | Ht 67.0 in | Wt 173.0 lb

## 2020-12-26 DIAGNOSIS — D509 Iron deficiency anemia, unspecified: Secondary | ICD-10-CM | POA: Insufficient documentation

## 2020-12-26 DIAGNOSIS — I2510 Atherosclerotic heart disease of native coronary artery without angina pectoris: Secondary | ICD-10-CM | POA: Insufficient documentation

## 2020-12-26 DIAGNOSIS — N183 Chronic kidney disease, stage 3 unspecified: Secondary | ICD-10-CM | POA: Insufficient documentation

## 2020-12-26 DIAGNOSIS — Z7984 Long term (current) use of oral hypoglycemic drugs: Secondary | ICD-10-CM | POA: Insufficient documentation

## 2020-12-26 DIAGNOSIS — I129 Hypertensive chronic kidney disease with stage 1 through stage 4 chronic kidney disease, or unspecified chronic kidney disease: Secondary | ICD-10-CM | POA: Diagnosis not present

## 2020-12-26 DIAGNOSIS — Z79899 Other long term (current) drug therapy: Secondary | ICD-10-CM | POA: Insufficient documentation

## 2020-12-26 DIAGNOSIS — N189 Chronic kidney disease, unspecified: Secondary | ICD-10-CM

## 2020-12-26 DIAGNOSIS — D631 Anemia in chronic kidney disease: Secondary | ICD-10-CM | POA: Diagnosis not present

## 2020-12-26 LAB — CBC WITH DIFFERENTIAL/PLATELET
Abs Immature Granulocytes: 0.01 10*3/uL (ref 0.00–0.07)
Basophils Absolute: 0 10*3/uL (ref 0.0–0.1)
Basophils Relative: 0 %
Eosinophils Absolute: 0.2 10*3/uL (ref 0.0–0.5)
Eosinophils Relative: 4 %
HCT: 30.7 % — ABNORMAL LOW (ref 39.0–52.0)
Hemoglobin: 10.6 g/dL — ABNORMAL LOW (ref 13.0–17.0)
Immature Granulocytes: 0 %
Lymphocytes Relative: 19 %
Lymphs Abs: 1 10*3/uL (ref 0.7–4.0)
MCH: 33 pg (ref 26.0–34.0)
MCHC: 34.5 g/dL (ref 30.0–36.0)
MCV: 95.6 fL (ref 80.0–100.0)
Monocytes Absolute: 0.7 10*3/uL (ref 0.1–1.0)
Monocytes Relative: 12 %
Neutro Abs: 3.6 10*3/uL (ref 1.7–7.7)
Neutrophils Relative %: 65 %
Platelets: 115 10*3/uL — ABNORMAL LOW (ref 150–400)
RBC: 3.21 MIL/uL — ABNORMAL LOW (ref 4.22–5.81)
RDW: 13.2 % (ref 11.5–15.5)
WBC: 5.5 10*3/uL (ref 4.0–10.5)
nRBC: 0 % (ref 0.0–0.2)

## 2020-12-26 LAB — IRON AND TIBC
Iron: 79 ug/dL (ref 45–182)
Saturation Ratios: 30 % (ref 17.9–39.5)
TIBC: 265 ug/dL (ref 250–450)
UIBC: 186 ug/dL

## 2020-12-26 LAB — FERRITIN: Ferritin: 202 ng/mL (ref 24–336)

## 2020-12-26 NOTE — Addendum Note (Signed)
Addended by: Kern Alberta on: 12/26/2020 02:13 PM   Modules accepted: Orders

## 2020-12-26 NOTE — Progress Notes (Signed)
Hematology/Oncology Consult note [facility]  Telephone:([phone] [phone]  Patient Care Team: Remi Haggard, FNP as PCP - General (Family Medicine) End, Harrell Gave, MD as PCP - Cardiology (Cardiology)   Name of the patient: Rodney Wiley  [id number]  [birth year]   Date of visit: 12/26/20  Diagnosis- anemia likely secondary to CKD and iron deficiency Thrombocytopenia  Chief complaint/ Reason for visit-routine follow-up of anemia  Heme/Onc history: patient is a85 year old male with a past medical history significant for hypertension, hyperlipidemia, stage III CKD and history of aortic insufficiency as well as ascending aortic aneurysm. He recently underwent bioprosthetic aortic valve replacement, CABG x3 and resuspension of the left atrium on 02/22/2018.   Results of blood work in June 2021 were consistent with iron deficiency along with CKD. He received venofer in July 2021.    Interval history-patient is doing well for his age.  Denies any specific complaints at this time.  ECOG PS- 1 Pain scale- 0 Opioid associated constipation- no  Review of systems- Review of Systems  Constitutional: Negative for chills, fever, malaise/fatigue and weight loss.  HENT: Negative for congestion, ear discharge and nosebleeds.   Eyes: Negative for blurred vision.  Respiratory: Negative for cough, hemoptysis, sputum production, shortness of breath and wheezing.   Cardiovascular: Negative for chest pain, palpitations, orthopnea and claudication.  Gastrointestinal: Negative for abdominal pain, blood in stool, constipation, diarrhea, heartburn, melena, nausea and vomiting.  Genitourinary: Negative for dysuria, flank pain, frequency, hematuria and urgency.  Musculoskeletal: Negative for back pain, joint pain and myalgias.  Skin: Negative for rash.  Neurological: Negative for dizziness, tingling, focal weakness, seizures, weakness and headaches.   Endo/Heme/Allergies: Does not bruise/bleed easily.  Psychiatric/Behavioral: Negative for depression and suicidal ideas. The patient does not have insomnia.       No Known Allergies   Past Medical History:  Diagnosis Date   Anemia    Aortic insufficiency    a. 02/2018 s/p bioprosthetic AVR; 04/2018 Echo: EF 50-55%, Gr2 DD, Ao bioprosthesis, mean grad 6mmHg, Ao root/Asc Ao nl in size, mild MR, mildly dil LA, nl RV fxn. Nl PASP.   Arthropathy    Ascending aortic aneurysm ([place])    a. 12/2016 MRA: 4.3cm @ sinus of valsalva, 4.9cm above sinotubular jxn; b. 02/2018 s/p biological Bentall and AVR.   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 02/2018 Cath: LAD 20ost, 40p, 43m, LCX 60p, OM2 60, OM3 85, RCA 10p/m w/ L->R and R->R collats;  b. 02/2018 CABG x 3: LIMA to LAD, SVG to OM2, SVG to PDA, EVH via right thigh.   CKD (chronic kidney disease), stage III (HCC)    Diverticulitis    Essential hypertension    GERD (gastroesophageal reflux disease)    Hemorrhoids    History of chicken pox    History of Helicobacter pylori infection 06/2007   Hyperlipidemia    Hypertension    Left renal artery stenosis (HCC)    a. 01/2017 RA u/s: moderate L RAS.   Lower extremity edema    Nonrheumatic mitral (valve) insufficiency    a. 12/2016 Echo: mild to mod MR; b. 09/2017 TEE: mild to mod MR; c. 04/2018 Echo: Mild MR.   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 s/p 21 mm Edwards Inspiris Resilia stented bovine pericardial tissue valve and 24 mm Gelweave  Valsalva synthetic root conduit with reimplantation of left main coronary artery.     Past Surgical History:  Procedure Laterality Date   AORTIC VALVE REPAIR N/A 02/22/2018   Procedure: AORTIC VALVE REPLACEMENT -Biological Bentall aortic root replacement,;  Surgeon: Rexene Alberts, MD;   Location: [place];  Service: Open Heart Surgery;  Laterality: N/A;   bypass surgery     CARDIAC CATHETERIZATION     01/16/2018   COLONOSCOPY  2011   by Dr. Candace Cruise with findings of diverticulosis   CORONARY ARTERY BYPASS GRAFT N/A 02/22/2018   Procedure: CORONARY ARTERY BYPASS GRAFTING (CABG) x three, using left internal mammary artery and right leg greater saphenous vein harvested endoscopically;  Surgeon: Rexene Alberts, MD;  Location: [place];  Service: Open Heart Surgery;  Laterality: N/A;   CORONARY/GRAFT ANGIOGRAPHY N/A 01/16/2018   Procedure: CORONARY/GRAFT ANGIOGRAPHY;  Surgeon: Nelva Bush, MD;  Location: [place] CV [facility];  Service: Cardiovascular;  Laterality: N/A;   ELBOW SURGERY Left    hemorhoidectomy     RIGHT HEART CATH N/A 01/16/2018   Procedure: RIGHT HEART CATH;  Surgeon: Nelva Bush, MD;  Location: [place] [facility];  Service: Cardiovascular;  Laterality: N/A;   SHOULDER ARTHROSCOPY WITH ROTATOR CUFF REPAIR Right    SHOULDER SURGERY Right    TEE WITHOUT CARDIOVERSION N/A 09/27/2017   Procedure: TRANSESOPHAGEAL ECHOCARDIOGRAM (TEE);  Surgeon: Dorothy Spark, MD;  Location: [facility] ENDOSCOPY;  Service: Cardiovascular;  Laterality: N/A;   TEE WITHOUT CARDIOVERSION N/A 02/22/2018   Procedure: TRANSESOPHAGEAL ECHOCARDIOGRAM (TEE);  Surgeon: Rexene Alberts, MD;  Location: [place];  Service: Open Heart Surgery;  Laterality: N/A;   THORACIC AORTIC ANEURYSM REPAIR N/A 02/22/2018   Procedure: THORACIC ASCENDING ANEURYSM REPAIR (AAA) Resection of ascending aorta aneurysm;  Surgeon: Rexene Alberts, MD;  Location: [facility];  Service: Open Heart Surgery;  Laterality: N/A;    Social History   Socioeconomic History   Marital status: Unknown    Spouse name: Not on file   Number of children: Not on file   Years of education: Not on file   Highest education level: Not on file  Occupational History   Not on file  Tobacco Use   Smoking status: Never Smoker    Smokeless tobacco: Never Used  Vaping Use   Vaping Use: Never used  Substance and Sexual Activity   Alcohol use: No   Drug use: No   Sexual activity: Not Currently  Other Topics Concern   Not on file  Social History Narrative   Not on file   Social Determinants of Health   Financial Resource Strain: Not on file  Food Insecurity: Not on file  Transportation Needs: Not on file  Physical Activity: Not on file  Stress: Not on file  Social Connections: Not on file  Intimate Partner Violence: Not on file    Family History  Problem Relation Age of Onset   Hypertension Mother    Stroke Mother    Leukemia Father    Heart attack Paternal Grandmother    Stroke Paternal Grandfather    Heart Problems Son 49   Heart Problems Son 6   Uterine cancer Sister    Prostate cancer Brother    Colon cancer Brother    Kidney failure Brother    Stroke Sister      Current Outpatient Medications:    amLODipine (NORVASC) 10 MG tablet, TAKE ONE TABLET BY MOUTH DAILY, Disp: 90 tablet, Rfl: 1   ascorbic acid (VITAMIN  C) 500 MG tablet, Take 500 mg by mouth daily., Disp: , Rfl:    aspirin EC 81 MG tablet, Take 81 mg by mouth daily., Disp: , Rfl:    atorvastatin (LIPITOR) 20 MG tablet, TAKE ONE TABLET BY MOUTH DAILY, Disp: 90 tablet, Rfl: 3   carvedilol (COREG) 12.5 MG tablet, Take 1 tablet (12.5 mg total) by mouth 2 (two) times daily., Disp: 180 tablet, Rfl: 1   cholecalciferol (VITAMIN D3) 25 MCG (1000 UT) tablet, Take 1,000 Units by mouth daily., Disp: , Rfl:    doxazosin (CARDURA) 4 MG tablet, TAKE ONE TABLET BY MOUTH DAILY, Disp: 90 tablet, Rfl: 1   FARXIGA 10 MG TABS tablet, Take 10 mg by mouth daily., Disp: , Rfl:    ferrous sulfate 325 (65 FE) MG EC tablet, Take 325 mg by mouth daily with breakfast. , Disp: , Rfl:    hydrALAZINE (APRESOLINE) 100 MG tablet, Take 1 tablet (100 mg total) by mouth 3 (three) times daily., Disp: 90 tablet, Rfl: 2   isosorbide  mononitrate (IMDUR) 30 MG 24 hr tablet, Take 1 tablet (30 mg total) by mouth daily., Disp: 90 tablet, Rfl: 3   omeprazole (PRILOSEC) 20 MG capsule, Take 20 mg by mouth daily., Disp: , Rfl:    torsemide (DEMADEX) 20 MG tablet, Take 0.5 tablets (10 mg total) by mouth daily., Disp: 45 tablet, Rfl: 1   nitroGLYCERIN (NITROSTAT) 0.4 MG SL tablet, Place 1 tablet (0.4 mg total) under the tongue every 5 (five) minutes as needed for chest pain. Maximum of 3 doses. (Patient not taking: Reported on 12/26/2020), Disp: 25 tablet, Rfl: 2  Physical exam:  Vitals:   12/26/20 1121  BP: 125/70  Pulse: (!) 59  Resp: 16  Temp: (!) 97.5 F (36.4 C)  TempSrc: Oral  Weight: 173 lb (78.5 kg)  Height: 5\' 7"  (1.702 m)   Physical Exam Constitutional:      General: He is not in acute distress. Eyes:     Extraocular Movements: EOM normal.  Cardiovascular:     Rate and Rhythm: Normal rate and regular rhythm.     Heart sounds: Normal heart sounds.  Pulmonary:     Effort: Pulmonary effort is normal.     Breath sounds: Normal breath sounds.  Abdominal:     General: Bowel sounds are normal.     Palpations: Abdomen is soft.  Skin:    General: Skin is warm and dry.  Neurological:     Mental Status: He is alert and oriented to person, place, and time.      CMP Latest Ref Rng & Units 08/26/2020  Glucose 70 - 99 mg/dL 107(H)  BUN 8 - 23 mg/dL 37(H)  Creatinine 0.61 - 1.24 mg/dL 2.22(H)  Sodium 135 - 145 mmol/L 138  Potassium 3.5 - 5.1 mmol/L 3.9  Chloride 98 - 111 mmol/L 108  CO2 22 - 32 mmol/L 22  Calcium 8.9 - 10.3 mg/dL 8.3(L)  Total Protein 6.5 - 8.1 g/dL 6.9  Total Bilirubin 0.3 - 1.2 mg/dL 0.8  Alkaline Phos 38 - 126 U/L 48  AST 15 - 41 U/L 21  ALT 0 - 44 U/L 17   CBC Latest Ref Rng & Units 12/26/2020  WBC 4.0 - 10.5 K/uL 5.5  Hemoglobin 13.0 - 17.0 g/dL 10.6(L)  Hematocrit 39.0 - 52.0 % 30.7(L)  Platelets 150 - 400 K/uL 115(L)      Assessment and plan- Patient is a 85 y.o. male with  history of iron deficiency  anemia and anemia secondary to chronic kidney disease here for routine follow-up  After receiving IV iron in July 2021 patient's hemoglobin is presently stable between 10-11.  Iron studies from today are pending but hemoglobin was 10.6. He does not require any EPO at this time.  We will continue to monitor CBC ferritin and iron studies every 3 months and I will see him back in 6 months   Visit Diagnosis 1. Iron deficiency anemia, unspecified iron deficiency anemia type   2. Anemia of chronic kidney failure, unspecified stage      Dr. Randa Evens, MD, MPH [facility] at [facility] [phone] 12/26/2020 1:19 PM

## 2020-12-26 NOTE — Progress Notes (Signed)
Pt doing well no c/o ?

## 2021-01-11 ENCOUNTER — Other Ambulatory Visit: Payer: Self-pay | Admitting: Internal Medicine

## 2021-01-28 DIAGNOSIS — I10 Essential (primary) hypertension: Secondary | ICD-10-CM | POA: Diagnosis not present

## 2021-01-28 DIAGNOSIS — E7800 Pure hypercholesterolemia, unspecified: Secondary | ICD-10-CM | POA: Diagnosis not present

## 2021-01-28 DIAGNOSIS — E559 Vitamin D deficiency, unspecified: Secondary | ICD-10-CM | POA: Diagnosis not present

## 2021-01-28 DIAGNOSIS — N400 Enlarged prostate without lower urinary tract symptoms: Secondary | ICD-10-CM | POA: Diagnosis not present

## 2021-01-28 DIAGNOSIS — D649 Anemia, unspecified: Secondary | ICD-10-CM | POA: Diagnosis not present

## 2021-01-28 DIAGNOSIS — R011 Cardiac murmur, unspecified: Secondary | ICD-10-CM | POA: Diagnosis not present

## 2021-01-28 DIAGNOSIS — D519 Vitamin B12 deficiency anemia, unspecified: Secondary | ICD-10-CM | POA: Diagnosis not present

## 2021-02-05 ENCOUNTER — Other Ambulatory Visit: Payer: Self-pay | Admitting: Internal Medicine

## 2021-02-10 ENCOUNTER — Encounter: Payer: Self-pay | Admitting: Family Medicine

## 2021-02-12 ENCOUNTER — Telehealth: Payer: Self-pay | Admitting: Urology

## 2021-02-15 NOTE — Telephone Encounter (Signed)
error 

## 2021-02-19 DIAGNOSIS — Z6827 Body mass index (BMI) 27.0-27.9, adult: Secondary | ICD-10-CM | POA: Diagnosis not present

## 2021-02-19 DIAGNOSIS — D6869 Other thrombophilia: Secondary | ICD-10-CM | POA: Diagnosis not present

## 2021-02-19 DIAGNOSIS — K219 Gastro-esophageal reflux disease without esophagitis: Secondary | ICD-10-CM | POA: Diagnosis not present

## 2021-02-19 DIAGNOSIS — I4891 Unspecified atrial fibrillation: Secondary | ICD-10-CM | POA: Diagnosis not present

## 2021-02-19 DIAGNOSIS — E119 Type 2 diabetes mellitus without complications: Secondary | ICD-10-CM | POA: Diagnosis not present

## 2021-02-19 DIAGNOSIS — K5900 Constipation, unspecified: Secondary | ICD-10-CM | POA: Diagnosis not present

## 2021-02-19 DIAGNOSIS — I509 Heart failure, unspecified: Secondary | ICD-10-CM | POA: Diagnosis not present

## 2021-02-19 DIAGNOSIS — M1990 Unspecified osteoarthritis, unspecified site: Secondary | ICD-10-CM | POA: Diagnosis not present

## 2021-02-19 DIAGNOSIS — E663 Overweight: Secondary | ICD-10-CM | POA: Diagnosis not present

## 2021-02-19 DIAGNOSIS — E785 Hyperlipidemia, unspecified: Secondary | ICD-10-CM | POA: Diagnosis not present

## 2021-02-19 DIAGNOSIS — I2510 Atherosclerotic heart disease of native coronary artery without angina pectoris: Secondary | ICD-10-CM | POA: Diagnosis not present

## 2021-02-19 DIAGNOSIS — I110 Hypertensive heart disease with heart failure: Secondary | ICD-10-CM | POA: Diagnosis not present

## 2021-02-19 DIAGNOSIS — Z008 Encounter for other general examination: Secondary | ICD-10-CM | POA: Diagnosis not present

## 2021-02-27 ENCOUNTER — Other Ambulatory Visit: Payer: Self-pay

## 2021-02-27 ENCOUNTER — Encounter: Payer: Self-pay | Admitting: Internal Medicine

## 2021-02-27 ENCOUNTER — Other Ambulatory Visit
Admission: RE | Admit: 2021-02-27 | Discharge: 2021-02-27 | Disposition: A | Discharge status: Home / Self Care | Payer: Medicare HMO | Source: Ambulatory Visit | Attending: Internal Medicine | Admitting: Internal Medicine

## 2021-02-27 ENCOUNTER — Ambulatory Visit: Payer: Medicare HMO | Admitting: Internal Medicine

## 2021-02-27 VITALS — BP 140/62 | HR 101 | Ht 67.0 in | Wt 177.0 lb

## 2021-02-27 DIAGNOSIS — I471 Supraventricular tachycardia, unspecified: Secondary | ICD-10-CM

## 2021-02-27 DIAGNOSIS — I5032 Chronic diastolic (congestive) heart failure: Secondary | ICD-10-CM

## 2021-02-27 DIAGNOSIS — I712 Thoracic aortic aneurysm, without rupture, unspecified: Secondary | ICD-10-CM

## 2021-02-27 DIAGNOSIS — I351 Nonrheumatic aortic (valve) insufficiency: Secondary | ICD-10-CM | POA: Diagnosis not present

## 2021-02-27 DIAGNOSIS — N184 Chronic kidney disease, stage 4 (severe): Secondary | ICD-10-CM | POA: Diagnosis not present

## 2021-02-27 DIAGNOSIS — I2510 Atherosclerotic heart disease of native coronary artery without angina pectoris: Secondary | ICD-10-CM | POA: Diagnosis not present

## 2021-02-27 LAB — COMPREHENSIVE METABOLIC PANEL
ALT: 20 U/L (ref 0–44)
AST: 20 U/L (ref 15–41)
Albumin: 4 g/dL (ref 3.5–5.0)
Alkaline Phosphatase: 52 U/L (ref 38–126)
Anion gap: 10 (ref 5–15)
BUN: 58 mg/dL — ABNORMAL HIGH (ref 8–23)
CO2: 21 mmol/L — ABNORMAL LOW (ref 22–32)
Calcium: 8.7 mg/dL — ABNORMAL LOW (ref 8.9–10.3)
Chloride: 108 mmol/L (ref 98–111)
Creatinine, Ser: 2.46 mg/dL — ABNORMAL HIGH (ref 0.61–1.24)
GFR, Estimated: 26 mL/min — ABNORMAL LOW (ref >60)
Glucose, Bld: 99 mg/dL (ref 70–99)
Potassium: 3.4 mmol/L — ABNORMAL LOW (ref 3.5–5.1)
Sodium: 139 mmol/L (ref 135–145)
Total Bilirubin: 0.8 mg/dL (ref 0.3–1.2)
Total Protein: 7 g/dL (ref 6.5–8.1)

## 2021-02-27 LAB — CBC
HCT: 32.8 % — ABNORMAL LOW (ref 39.0–52.0)
Hemoglobin: 11.3 g/dL — ABNORMAL LOW (ref 13.0–17.0)
MCH: 32.7 pg (ref 26.0–34.0)
MCHC: 34.5 g/dL (ref 30.0–36.0)
MCV: 94.8 fL (ref 80.0–100.0)
Platelets: 127 10*3/uL — ABNORMAL LOW (ref 150–400)
RBC: 3.46 MIL/uL — ABNORMAL LOW (ref 4.22–5.81)
RDW: 13.2 % (ref 11.5–15.5)
WBC: 5.7 10*3/uL (ref 4.0–10.5)
nRBC: 0 % (ref 0.0–0.2)

## 2021-02-27 LAB — MAGNESIUM: Magnesium: 2.1 mg/dL (ref 1.7–2.4)

## 2021-02-27 LAB — TSH: TSH: 3.473 u[IU]/mL (ref 0.350–4.500)

## 2021-02-27 MED ORDER — AMIODARONE HCL 200 MG PO TABS: 200.0000 mg | ORAL_TABLET | Freq: Two times a day (BID) | ORAL | 0 refills | Status: DC

## 2021-02-27 NOTE — Patient Instructions (Signed)
Medication Instructions:  Your physician has recommended you make the following change in your medication:   START Amiodarone 200 mg one tablet twice a day. An Rx has been sent to your pharmacy.  *If you need a refill on your cardiac medications before your next appointment, please call your pharmacy*   Lab Work: Cbc, Cmp, Mag, Tsh today.  Please have your labs drawn at the medical mall. You can stop at the registration desk to check in.  If you have labs (blood work) drawn today and your tests are completely normal, you will receive your results only by: [name] Kitchen MyChart Message (if you have MyChart) OR . A paper copy in the mail If you have any lab test that is abnormal or we need to change your treatment, we will call you to review the results.   Testing/Procedures: Your physician has requested that you have an echocardiogram. Echocardiography is a painless test that uses sound waves to create images of your heart. It provides your doctor with information about the size and shape of your heart and how well your heart's chambers and valves are working. This procedure takes approximately one hour. There are no restrictions for this procedure.     Follow-Up: At [facility], you and your health needs are our priority.  As part of our continuing mission to provide you with exceptional heart care, we have created designated Provider Care Teams.  These Care Teams include your primary Cardiologist (physician) and Advanced Practice Providers (APPs -  Physician Assistants and Nurse Practitioners) who all work together to provide you with the care you need, when you need it.  We recommend signing up for the patient portal called "MyChart".  Sign up information is provided on this After Visit Summary.  MyChart is used to connect with patients for Virtual Visits (Telemedicine).  Patients are able to view lab/test results, encounter notes, upcoming appointments, etc.  Non-urgent messages can be sent to  your provider as well.   To learn more about what you can do with MyChart, go to [email or web address].    Your next appointment:   2-3 week(s)  The format for your next appointment:   In Person  Provider:   You may see Nelva Bush, MD or one of the following Advanced Practice Providers on your designated Care Team:    Murray Hodgkins, NP  Christell Faith, PA-C  Marrianne Mood, PA-C  Cadence Estelline, [place]  Laurann Montana, NP    Other Instructions N/A

## 2021-02-27 NOTE — Progress Notes (Signed)
Follow-up Outpatient Visit Date: 02/27/2021  Primary Care Provider: Remi Haggard, [place] [address]  Chief Complaint: Follow-up coronary artery disease, aortic regurgitation, and thoracic aortic aneurysm status post repair  HPI:  Rodney Wiley is a 85 y.o. male with history of hypertension,coronary artery disease status post CABG (3/19)aortic regurgitationandthoracic aortic aneurysmstatus post Bentall with bioprosthetic aortic valve replacement (3/19),SVT, HFpEF,and chronic kidney disease stage 3withquestionableleft renal artery stenosis, who presents for follow-up of coronary artery disease, valvular heart disease, and hypertension.  I last saw Rodney Wiley in 11/2020, at which time he was feeling relatively well.  He noted occasional nonexertional chest pain that resolved with palpation of the chest wall.  Chronic leg edema was controlled with as needed torsemide.  Home blood pressures had been under better control with sodium restriction.  We did not make any medication changes or additional testing.  Sublingual nitroglycerin was provided.  Today, Rodney Wiley reports that he has been feeling fairly well.  He notices occasional pulling along his median sternotomy incision but otherwise no significant chest pain.  He has not had any shortness of breath, palpitations, or lightheadedness.  Chronic leg edema is stable.  Home blood pressures are typically similar to today's are slightly lower.  --------------------------------------------------------------------------------------------------  Cardiovascular History & Procedures: Cardiovascular Problems:  Thoracic aortic aneurysms/p Bentall  Aortic regurgitations/p bioprosthetic AVR  Mitral regurgitation  Questionable renal artery stenosis  Coronary artery disease s/p CABG  Supraventricular tachycardia  Postoperative atrial fibrillation  Risk Factors:  Known CAD, hypertension, hyperlipidemia,  male gender, and age > 44  Cath/PCI:  Coronary angiography/RHC (01/16/2018): LMCA normal. LAD with sequential 20% ostial, 40% proximal, and long 70% proximal to mid stenoses. LCx with 60% proximal stenosis and 60 to 80-90% OM2 and OM3 lesions. Chronic total occlusion of the proximal/mid RCA with left-to-right collaterals. RA 9, RV 43/10, PA 37/15 (22), and PCWP 18. Fick CO/CI 5.1/2.6.  CV Surgery:  CABG and Bentall with bioprosthetic AVR (02/22/2018, Dr. Roxy Manns): LIMA to LAD, SVG to OM2, and SVG to PDA. 24 mm synthetic aortic root graft and 21 mm Edwards InspirisRoselia stented bovine pericardial tissue valve.  EP Procedures and Devices:  Event monitor (01/22/19): Patient was monitored for 2 days, 22 hours.Predominantly sinus rhythm with rare PAC's and frequent PVC's. Several brief atrial runs were noted.PVC burden 8.4%.  Non-Invasive Evaluation(s):  Renal artery Doppler (02/06/19): No evidence of significant renal artery stenosis affecting either kidney.  TTE (04/07/18): Normal LV size with mild LVH. LVEF 50 to 55% with anteroseptal and septal hypokinesis. Grade 2 diastolic dysfunction. Bioprosthetic aortic valve present with mean gradient of 17 mmHg. Mild mitral regurgitation. Normal RV size and function. No pericardial effusion.  TEE (09/27/17): Mildly dilated LV with LVEF 60-65%. Moderate aortic regurgitation. Mild to moderate mitral regurgitation. Mild right atrial enlargement. Moderate aortic dilation, with root measuring 5.0 cm and ascending aorta 4.7 cm  MRA chest (06/06/17): Stable ascending aortic aneurysm, measuring 5.0 cm atthe sinotubular junction and 4.8 cm at the level of the main pulmonary artery.  Renal artery Doppler (01/13/17): Aortic atherosclerosis without stenosis or aneurysm. Normal right renal artery. Mild to moderate left renal artery stenosis (1-59%). Normal/symmetric kidney size.  MRA chest (12/27/16): Aneurysmal disease of the ascending aorta, measuring  4.3 cm at the sinus of Valsalva and 4.9 cm above the sinotubular junction.  TTE (12/14/16): Mildly dilated LV with mild LVH. Normal contraction with LVEF 55-60%. Grade 1 diastolic dysfunction. Mild to moderate aortic regurgitation. Dilated aorta, measuring up  to 4.9 cm above the sinotubular junction. Mild to moderate mitral regurgitation. Mild left atrial enlargement. Normal RV size and function.  Transthoracic echo (09/2016, [facility]): Full report not available. Reportedly showed LVH with LVEF 57% and diastolic dysfunction. MR and moderate to severe AI present with sclerotic aortic valve.  Carotid Doppler (10/201&): Full report not available. Reportedly showed 1-39% stenosis bilaterally.  Transthoracic echo (02/18/16, [facility]): Normal LV and RV size and function. LVEF >55%. Moderate AI; mild MR and TR.   Recent CV Pertinent Labs: Lab Results  Component Value Date   CHOL 132 09/15/2018   HDL 33 (L) 09/15/2018   LDLCALC 56 09/15/2018   TRIG 214 (H) 09/15/2018   CHOLHDL 4.0 09/15/2018   CHOLHDL 3.8 03/11/2017   INR 2.9 05/24/2018   INR 7.53 (HH) 03/27/2018   K 3.4 (L) 02/27/2021   MG 2.1 02/27/2021   BUN 58 (H) 02/27/2021   BUN 34 (H) 05/07/2020   CREATININE 2.46 (H) 02/27/2021    Past medical and surgical history were reviewed and updated in EPIC.  Current Meds  Medication Sig  . amiodarone (PACERONE) 200 MG tablet Take 1 tablet (200 mg total) by mouth 2 (two) times daily.  [name] Kitchen amLODipine (NORVASC) 10 MG tablet TAKE ONE TABLET BY MOUTH DAILY  . ascorbic acid (VITAMIN C) 500 MG tablet Take 500 mg by mouth daily.  [name] Kitchen aspirin EC 81 MG tablet Take 81 mg by mouth daily.  [name] Kitchen atorvastatin (LIPITOR) 20 MG tablet TAKE ONE TABLET BY MOUTH DAILY  . carvedilol (COREG) 12.5 MG tablet Take 1 tablet (12.5 mg total) by mouth 2 (two) times daily.  . cholecalciferol (VITAMIN D3) 25 MCG (1000 UT) tablet Take 1,000 Units by mouth daily.  [name] Kitchen doxazosin (CARDURA) 4 MG tablet  TAKE ONE TABLET BY MOUTH DAILY  . FARXIGA 10 MG TABS tablet Take 10 mg by mouth daily.  . ferrous sulfate 325 (65 FE) MG EC tablet Take 325 mg by mouth daily with breakfast.   . hydrALAZINE (APRESOLINE) 100 MG tablet TAKE 1 TABLET BY MOUTH 3 TIMES DAILY.  . isosorbide mononitrate (IMDUR) 30 MG 24 hr tablet Take 1 tablet (30 mg total) by mouth daily.  . nitroGLYCERIN (NITROSTAT) 0.4 MG SL tablet Place 1 tablet (0.4 mg total) under the tongue every 5 (five) minutes as needed for chest pain. Maximum of 3 doses.  [name] Kitchen omeprazole (PRILOSEC) 20 MG capsule Take 20 mg by mouth daily.  [name] Kitchen torsemide (DEMADEX) 20 MG tablet Take 0.5 tablets (10 mg total) by mouth daily.    Allergies: Patient has no known allergies.  Social History   Tobacco Use  . Smoking status: Never Smoker  . Smokeless tobacco: Never Used  Vaping Use  . Vaping Use: Never used  Substance Use Topics  . Alcohol use: No  . Drug use: No    Family History  Problem Relation Age of Onset  . Hypertension Mother   . Stroke Mother   . Leukemia Father   . Heart attack Paternal Grandmother   . Stroke Paternal Grandfather   . Heart Problems Son 76  . Heart Problems Son 30  . Uterine cancer Sister   . Prostate cancer Brother   . Colon cancer Brother   . Kidney failure Brother   . Stroke Sister     Review of Systems: A 12-system review of systems was performed and was negative except as noted in the HPI.  --------------------------------------------------------------------------------------------------  Physical Exam: BP 140/62 (BP Location: Right  Arm, Patient Position: Sitting, Cuff Size: Normal)   Pulse (!) 101   Ht 5\' 7"  (1.702 m)   Wt 177 lb (80.3 kg)   SpO2 98%   BMI 27.72 kg/m   General:  NAD. Neck: No JVD or HJR. Lungs: Clear to auscultation bilaterally without wheezes or crackles. Heart: Tachycardic and irregular with 1/6 systolic murmur.  No rubs or gallops. Abdomen: Soft, nontender, nondistended. Extremities:  1+ pitting edema in both calves.  EKG: Normal sinus rhythm with PACs, PVCs, and first-degree AV block, left axis deviation, incomplete RBBB, inferior infarct, and poor R wave progression.  Heart rate has increased with more frequent PACs compared to prior tracing from 11/21/2020.  Lab Results  Component Value Date   WBC 5.7 02/27/2021   HGB 11.3 (L) 02/27/2021   HCT 32.8 (L) 02/27/2021   MCV 94.8 02/27/2021   PLT 127 (L) 02/27/2021    Lab Results  Component Value Date   NA 139 02/27/2021   K 3.4 (L) 02/27/2021   CL 108 02/27/2021   CO2 21 (L) 02/27/2021   BUN 58 (H) 02/27/2021   CREATININE 2.46 (H) 02/27/2021   GLUCOSE 99 02/27/2021   ALT 20 02/27/2021    Lab Results  Component Value Date   CHOL 132 09/15/2018   HDL 33 (L) 09/15/2018   LDLCALC 56 09/15/2018   TRIG 214 (H) 09/15/2018   CHOLHDL 4.0 09/15/2018    --------------------------------------------------------------------------------------------------  ASSESSMENT AND PLAN: PSVT and PACs: EKG today shows notable increase in heart rate from prior tracings as recently as 11/21/2020.  So there is some irregularity, I do not believe this represents atrial fibrillation.  Instead, it appears to be sinus rhythm with long first-degree AV block and frequent PACs.  Rodney Wiley is asymptomatic.  Given that he has experienced symptomatic SVT in the past, I think it would be best to add amiodarone 200 mg twice daily to be continued until follow-up, at which time we could consider decreasing it to 100 mg daily.  I am reluctant to escalate his beta-blocker therapy further in the setting of first-degree AV block.  I do not believe that anticoagulation is indicated at this time, as this does not appear to reflect atrial fibrillation.  We will have a low threshold for referral to EP if he remains tachycardic or becomes symptomatic.  I will check a CBC, CMP, magnesium, and TSH today.  We will also obtain an echocardiogram to ensure that his  EF remains stable without evidence of worsening valvular heart disease.  Coronary artery disease: No angina reported.  EKG shows increased heart rate today with stable inferior Q waves and poor R wave progression.  Continue current medications for secondary prevention.  Chronic HFpEF, thoracic aortic aneurysm, aortic regurgitation, and chronic kidney disease stage IV: Stable lower extremity edema with NYHA class II heart failure symptoms.  We will repeat an echocardiogram and continue current medications.  Continue current diuretic regimen.  I will check a complete metabolic panel today to ensure stable renal function in the setting of CKD stage IV.  Follow-up: Return to clinic in 2-3 weeks.  Nelva Bush, MD 02/28/2021 1:12 PM

## 2021-02-28 ENCOUNTER — Encounter: Payer: Self-pay | Admitting: Internal Medicine

## 2021-03-03 ENCOUNTER — Encounter: Payer: Self-pay | Admitting: Urology

## 2021-03-04 ENCOUNTER — Telehealth: Payer: Self-pay | Admitting: Internal Medicine

## 2021-03-04 ENCOUNTER — Encounter: Payer: Self-pay | Admitting: Emergency Medicine

## 2021-03-04 ENCOUNTER — Emergency Department
Admission: EM | Admit: 2021-03-04 | Discharge: 2021-03-04 | Disposition: A | Discharge status: Home / Self Care | Payer: Medicare HMO | Attending: Emergency Medicine | Admitting: Emergency Medicine

## 2021-03-04 ENCOUNTER — Emergency Department: Payer: Medicare HMO

## 2021-03-04 ENCOUNTER — Other Ambulatory Visit: Payer: Self-pay

## 2021-03-04 ENCOUNTER — Ambulatory Visit
Admission: EM | Admit: 2021-03-04 | Discharge: 2021-03-04 | Disposition: A | Discharge status: Other Acute Inpatient Hospital | Payer: Medicare HMO | Source: Home / Self Care

## 2021-03-04 DIAGNOSIS — N3001 Acute cystitis with hematuria: Secondary | ICD-10-CM | POA: Insufficient documentation

## 2021-03-04 DIAGNOSIS — N3000 Acute cystitis without hematuria: Secondary | ICD-10-CM | POA: Insufficient documentation

## 2021-03-04 DIAGNOSIS — R630 Anorexia: Secondary | ICD-10-CM | POA: Insufficient documentation

## 2021-03-04 DIAGNOSIS — Z7982 Long term (current) use of aspirin: Secondary | ICD-10-CM | POA: Diagnosis not present

## 2021-03-04 DIAGNOSIS — I5032 Chronic diastolic (congestive) heart failure: Secondary | ICD-10-CM | POA: Insufficient documentation

## 2021-03-04 DIAGNOSIS — R7989 Other specified abnormal findings of blood chemistry: Secondary | ICD-10-CM

## 2021-03-04 DIAGNOSIS — M47816 Spondylosis without myelopathy or radiculopathy, lumbar region: Secondary | ICD-10-CM | POA: Diagnosis not present

## 2021-03-04 DIAGNOSIS — N184 Chronic kidney disease, stage 4 (severe): Secondary | ICD-10-CM | POA: Insufficient documentation

## 2021-03-04 DIAGNOSIS — Z79899 Other long term (current) drug therapy: Secondary | ICD-10-CM | POA: Insufficient documentation

## 2021-03-04 DIAGNOSIS — R338 Other retention of urine: Secondary | ICD-10-CM

## 2021-03-04 DIAGNOSIS — I130 Hypertensive heart and chronic kidney disease with heart failure and stage 1 through stage 4 chronic kidney disease, or unspecified chronic kidney disease: Secondary | ICD-10-CM | POA: Diagnosis not present

## 2021-03-04 DIAGNOSIS — Z20822 Contact with and (suspected) exposure to covid-19: Secondary | ICD-10-CM | POA: Diagnosis not present

## 2021-03-04 DIAGNOSIS — R42 Dizziness and giddiness: Secondary | ICD-10-CM | POA: Insufficient documentation

## 2021-03-04 DIAGNOSIS — K5730 Diverticulosis of large intestine without perforation or abscess without bleeding: Secondary | ICD-10-CM | POA: Diagnosis not present

## 2021-03-04 DIAGNOSIS — I2510 Atherosclerotic heart disease of native coronary artery without angina pectoris: Secondary | ICD-10-CM | POA: Diagnosis not present

## 2021-03-04 DIAGNOSIS — N281 Cyst of kidney, acquired: Secondary | ICD-10-CM | POA: Diagnosis not present

## 2021-03-04 DIAGNOSIS — I10 Essential (primary) hypertension: Secondary | ICD-10-CM | POA: Diagnosis not present

## 2021-03-04 DIAGNOSIS — Z951 Presence of aortocoronary bypass graft: Secondary | ICD-10-CM | POA: Insufficient documentation

## 2021-03-04 DIAGNOSIS — R300 Dysuria: Secondary | ICD-10-CM | POA: Diagnosis not present

## 2021-03-04 DIAGNOSIS — K8020 Calculus of gallbladder without cholecystitis without obstruction: Secondary | ICD-10-CM | POA: Diagnosis not present

## 2021-03-04 LAB — URINALYSIS, COMPLETE (UACMP) WITH MICROSCOPIC
Bilirubin Urine: NEGATIVE
Glucose, UA: 100 mg/dL — AB
Ketones, ur: NEGATIVE mg/dL
Nitrite: NEGATIVE
Protein, ur: 100 mg/dL — AB
Specific Gravity, Urine: 1.01 (ref 1.005–1.030)
Squamous Epithelial / HPF: NONE SEEN (ref 0–5)
WBC, UA: >50 WBC/hpf (ref 0–5)
pH: 5 (ref 5.0–8.0)

## 2021-03-04 LAB — RESP PANEL BY RT-PCR (FLU A&B, COVID) ARPGX2
Influenza A by PCR: NEGATIVE
Influenza B by PCR: NEGATIVE
SARS Coronavirus 2 by RT PCR: NEGATIVE

## 2021-03-04 LAB — CBC WITH DIFFERENTIAL/PLATELET
Abs Immature Granulocytes: 0.1 10*3/uL — ABNORMAL HIGH (ref 0.00–0.07)
Basophils Absolute: 0 10*3/uL (ref 0.0–0.1)
Basophils Relative: 0 %
Eosinophils Absolute: 0 10*3/uL (ref 0.0–0.5)
Eosinophils Relative: 0 %
HCT: 30 % — ABNORMAL LOW (ref 39.0–52.0)
Hemoglobin: 10.1 g/dL — ABNORMAL LOW (ref 13.0–17.0)
Immature Granulocytes: 1 %
Lymphocytes Relative: 7 %
Lymphs Abs: 0.8 10*3/uL (ref 0.7–4.0)
MCH: 32.1 pg (ref 26.0–34.0)
MCHC: 33.7 g/dL (ref 30.0–36.0)
MCV: 95.2 fL (ref 80.0–100.0)
Monocytes Absolute: 0.9 10*3/uL (ref 0.1–1.0)
Monocytes Relative: 7 %
Neutro Abs: 10.1 10*3/uL — ABNORMAL HIGH (ref 1.7–7.7)
Neutrophils Relative %: 85 %
Platelets: 115 10*3/uL — ABNORMAL LOW (ref 150–400)
RBC: 3.15 MIL/uL — ABNORMAL LOW (ref 4.22–5.81)
RDW: 13.5 % (ref 11.5–15.5)
WBC: 11.9 10*3/uL — ABNORMAL HIGH (ref 4.0–10.5)
nRBC: 0 % (ref 0.0–0.2)

## 2021-03-04 LAB — COMPREHENSIVE METABOLIC PANEL
ALT: 21 U/L (ref 0–44)
AST: 30 U/L (ref 15–41)
Albumin: 3.5 g/dL (ref 3.5–5.0)
Alkaline Phosphatase: 51 U/L (ref 38–126)
Anion gap: 8 (ref 5–15)
BUN: 50 mg/dL — ABNORMAL HIGH (ref 8–23)
CO2: 23 mmol/L (ref 22–32)
Calcium: 8.2 mg/dL — ABNORMAL LOW (ref 8.9–10.3)
Chloride: 103 mmol/L (ref 98–111)
Creatinine, Ser: 2.9 mg/dL — ABNORMAL HIGH (ref 0.61–1.24)
GFR, Estimated: 21 mL/min — ABNORMAL LOW (ref >60)
Glucose, Bld: 106 mg/dL — ABNORMAL HIGH (ref 70–99)
Potassium: 3.4 mmol/L — ABNORMAL LOW (ref 3.5–5.1)
Sodium: 134 mmol/L — ABNORMAL LOW (ref 135–145)
Total Bilirubin: 1.4 mg/dL — ABNORMAL HIGH (ref 0.3–1.2)
Total Protein: 7.3 g/dL (ref 6.5–8.1)

## 2021-03-04 MED ORDER — SODIUM CHLORIDE 0.9 % IV SOLN
1.0000 g | Freq: Once | INTRAVENOUS | Status: AC
  Administered 2021-03-04: 1 g via INTRAVENOUS
  Filled 2021-03-04: qty 10

## 2021-03-04 MED ORDER — IOHEXOL 9 MG/ML PO SOLN
500.0000 mL | ORAL | Status: AC
  Administered 2021-03-04 (×2): 500 mL via ORAL

## 2021-03-04 MED ORDER — PHENAZOPYRIDINE HCL 95 MG PO TABS: 95.0000 mg | ORAL_TABLET | Freq: Three times a day (TID) | ORAL | 0 refills | Status: AC | PRN

## 2021-03-04 MED ORDER — CEFDINIR 300 MG PO CAPS: 300.0000 mg | ORAL_CAPSULE | Freq: Every day | ORAL | 0 refills | Status: AC

## 2021-03-04 NOTE — Telephone Encounter (Signed)
Patient had labs drawn last Friday and had a high BUN result. Patient was advised to drink to drink lots of fluid and skip fluid pil that day. Patient is now running a fever of 101, unable to go to bathroom even though he feels like he needs it, very weak and not even getting up, also having chills. Daughter also states the patient had an EKG that also sparked concern  Please advise patients daughter Davy Pique with any advice

## 2021-03-04 NOTE — Discharge Instructions (Addendum)
As we discussed, your kidney function is worsening, you are not completely emptying your bladder, and you have a urinary tract infection.  It is possible that the worsening kidney function is due to the fact that you cannot empty your bladder fully and is causing back pressure on your kidneys.  Due to these above issues I am recommending that she go to the emergency department at [facility] for further evaluation and possible admission.

## 2021-03-04 NOTE — ED Provider Notes (Signed)
[facility] Emergency Department Provider Note   ____________________________________________   Event Date/Time   First MD Initiated Contact with Patient 03/04/21 1555     (approximate)  I have reviewed the triage vital signs and the nursing notes.   HISTORY  Chief Complaint Pyelonephritis and Urinary Retention    HPI Rodney Wiley is a 85 y.o. male we have the past medical history of hypertension, aortic insufficiency, GERD, and CAD who presents for dysuria, fever, lightheadedness, and decreased appetite over the past 3 days.  Patient states that he has also been having a very small amount of urination each time he feels like he needs to use the bathroom.  Patient currently denies any vision changes, tinnitus, difficulty speaking, facial droop, sore throat, chest pain, shortness of breath, abdominal pain, nausea/vomiting/diarrhea, or weakness/numbness/paresthesias in any extremity         Past Medical History:  Diagnosis Date  . Anemia   . Aortic insufficiency    a. 02/2018 s/p bioprosthetic AVR; 04/2018 Echo: EF 50-55%, Gr2 DD, Ao bioprosthesis, mean grad 32mmHg, Ao root/Asc Ao nl in size, mild MR, mildly dil LA, nl RV fxn. Nl PASP.  [name] Kitchen Arthropathy   . Ascending aortic aneurysm ([place])    a. 12/2016 MRA: 4.3cm @ sinus of valsalva, 4.9cm above sinotubular jxn; b. 02/2018 s/p biological Bentall and AVR.  [name] Kitchen BPH (benign prostatic hyperplasia)   . 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 02/2018 Cath: LAD 20ost, 40p, 77m, LCX 60p, OM2 60, OM3 85, RCA 10p/m w/ L->R and R->R collats;  b. 02/2018 CABG x 3: LIMA to LAD, SVG to OM2, SVG to PDA, EVH via right thigh.  . CKD (chronic kidney disease), stage III ([place])   . Diverticulitis   . Essential hypertension   . GERD (gastroesophageal reflux disease)   . Hemorrhoids   . History of chicken pox   . History of Helicobacter pylori infection 06/2007  . Hyperlipidemia   . Hypertension   . Left renal artery stenosis (HCC)     a. 01/2017 RA u/s: moderate L RAS.  [name] Kitchen Lower extremity edema   . Nonrheumatic mitral (valve) insufficiency    a. 12/2016 Echo: mild to mod MR; b. 09/2017 TEE: mild to mod MR; c. 04/2018 Echo: Mild MR.  [name] Kitchen Nonrheumatic pulmonary valve insufficiency   . 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 s/p 21 mm Edwards Inspiris Resilia stented bovine pericardial tissue valve and 24 mm Gelweave Valsalva synthetic root conduit with reimplantation of left main coronary artery.    Patient Active Problem List   Diagnosis Date Noted  . Chronic kidney disease, stage IV (severe) ([place]) 11/13/2020  . Elevated PSA 09/15/2020  . Benign prostatic hyperplasia without lower urinary tract symptoms 09/15/2020  . Anemia in chronic kidney disease 05/20/2020  . PSVT (paroxysmal supraventricular tachycardia) ([place]) 12/11/2018  . Chronic heart failure with preserved ejection fraction (HFpEF) ([place]) 12/11/2018  . Paroxysmal atrial fibrillation ([place]) 05/17/2018  . Chronic diastolic heart failure ([place]) 05/17/2018  . Medication management 05/17/2018  . History of aortic valve replacement with bioprosthetic valve 02/22/2018  . S/P CABG x 3 02/22/2018  . S/P ascending aortic aneurysm repair 02/22/2018  . Coronary artery disease involving native coronary artery of native heart without angina pectoris   . First degree AV block 12/21/2017  . Chronic kidney disease, stage III (moderate) ([place]) 03/22/2017  . Thoracic aortic aneurysm without rupture ([place])  01/27/2017  . Renal artery stenosis ([place]) 01/27/2017  . Hyperlipidemia LDL goal <70 01/27/2017  . Nonrheumatic aortic valve insufficiency 10/27/2016  . Mitral regurgitation 10/27/2016  . Essential hypertension 02/08/2007  . HYPERTENSION, BENIGN SYSTEMIC 02/02/2007    Past Surgical History:  Procedure Laterality Date  . AORTIC VALVE REPAIR N/A 02/22/2018   Procedure: AORTIC VALVE REPLACEMENT -Biological  Bentall aortic root replacement,;  Surgeon: Rexene Alberts, MD;  Location: [place];  Service: Open Heart Surgery;  Laterality: N/A;  . bypass surgery    . CARDIAC CATHETERIZATION     01/16/2018  . COLONOSCOPY  2011   by Dr. Candace Cruise with findings of diverticulosis  . CORONARY ARTERY BYPASS GRAFT N/A 02/22/2018   Procedure: CORONARY ARTERY BYPASS GRAFTING (CABG) x three, using left internal mammary artery and right leg greater saphenous vein harvested endoscopically;  Surgeon: Rexene Alberts, MD;  Location: [place];  Service: Open Heart Surgery;  Laterality: N/A;  . CORONARY/GRAFT ANGIOGRAPHY N/A 01/16/2018   Procedure: CORONARY/GRAFT ANGIOGRAPHY;  Surgeon: Nelva Bush, MD;  Location: [place] [facility];  Service: Cardiovascular;  Laterality: N/A;  . ELBOW SURGERY Left   . hemorhoidectomy    . RIGHT HEART CATH N/A 01/16/2018   Procedure: RIGHT HEART CATH;  Surgeon: Nelva Bush, MD;  Location: [place] [facility];  Service: Cardiovascular;  Laterality: N/A;  . SHOULDER ARTHROSCOPY WITH ROTATOR CUFF REPAIR Right   . SHOULDER SURGERY Right   . TEE WITHOUT CARDIOVERSION N/A 09/27/2017   Procedure: TRANSESOPHAGEAL ECHOCARDIOGRAM (TEE);  Surgeon: Dorothy Spark, MD;  Location[facility] ENDOSCOPY;  Service: Cardiovascular;  Laterality: N/A;  . TEE WITHOUT CARDIOVERSION N/A 02/22/2018   Procedure: TRANSESOPHAGEAL ECHOCARDIOGRAM (TEE);  Surgeon: Rexene Alberts, MD;  Location: [place];  Service: Open Heart Surgery;  Laterality: N/A;  . THORACIC AORTIC ANEURYSM REPAIR N/A 02/22/2018   Procedure: THORACIC ASCENDING ANEURYSM REPAIR (AAA) Resection of ascending aorta aneurysm;  Surgeon: Rexene Alberts, MD;  Location: [facility] OR;  Service: Open Heart Surgery;  Laterality: N/A;    Prior to Admission medications   Medication Sig Start Date End Date Taking? Authorizing Provider  cefdinir (OMNICEF) 300 MG capsule Take 1 capsule (300 mg total) by mouth daily for 5 days. 03/04/21 03/09/21 Yes Naaman Plummer, MD   phenazopyridine (PYRIDIUM) 95 MG tablet Take 1 tablet (95 mg total) by mouth 3 (three) times daily as needed for pain. 03/04/21 03/04/22 Yes Naaman Plummer, MD  amiodarone (PACERONE) 200 MG tablet Take 1 tablet (200 mg total) by mouth 2 (two) times daily. 02/27/21   End, Harrell Gave, MD  amLODipine (NORVASC) 10 MG tablet TAKE ONE TABLET BY MOUTH DAILY 12/15/20   End, Harrell Gave, MD  ascorbic acid (VITAMIN C) 500 MG tablet Take 500 mg by mouth daily.    [provider]  aspirin EC 81 MG tablet Take 81 mg by mouth daily.    [provider]  atorvastatin (LIPITOR) 20 MG tablet TAKE ONE TABLET BY MOUTH DAILY 05/30/20   End, Harrell Gave, MD  carvedilol (COREG) 12.5 MG tablet Take 1 tablet (12.5 mg total) by mouth 2 (two) times daily. 12/15/20   End, Harrell Gave, MD  cholecalciferol (VITAMIN D3) 25 MCG (1000 UT) tablet Take 1,000 Units by mouth daily.    [provider]  doxazosin (CARDURA) 4 MG tablet TAKE ONE TABLET BY MOUTH DAILY 11/12/20   End, Harrell Gave, MD  FARXIGA 10 MG TABS tablet Take 10 mg by mouth daily. 10/29/20   [provider]  ferrous sulfate 325 (65 FE) MG EC tablet Take 325 mg by mouth daily with breakfast.     [provider]  hydrALAZINE (APRESOLINE) 100 MG tablet TAKE 1 TABLET BY MOUTH 3 TIMES DAILY. 01/12/21   End, Harrell Gave, MD  isosorbide mononitrate (IMDUR) 30 MG 24 hr tablet Take 1 tablet (30 mg total) by mouth daily. 05/07/20   End, Harrell Gave, MD  nitroGLYCERIN (NITROSTAT) 0.4 MG SL tablet Place 1 tablet (0.4 mg total) under the tongue every 5 (five) minutes as needed for chest pain. Maximum of 3 doses. 11/12/20   End, Harrell Gave, MD  omeprazole (PRILOSEC) 20 MG capsule Take 20 mg by mouth daily.    [provider]  torsemide (DEMADEX) 20 MG tablet Take 0.5 tablets (10 mg total) by mouth daily. 06/20/20 12/17/20  Loel Dubonnet, NP    Allergies Patient has no known allergies.  Family History  Problem Relation Age of  Onset  . Hypertension Mother   . Stroke Mother   . Leukemia Father   . Heart attack Paternal Grandmother   . Stroke Paternal Grandfather   . Heart Problems Son 39  . Heart Problems Son 78  . Uterine cancer Sister   . Prostate cancer Brother   . Colon cancer Brother   . Kidney failure Brother   . Stroke Sister     Social History Social History   Tobacco Use  . Smoking status: Never Smoker  . Smokeless tobacco: Never Used  Vaping Use  . Vaping Use: Never used  Substance Use Topics  . Alcohol use: No  . Drug use: No    Review of Systems Constitutional: No fever/chills Eyes: No visual changes. ENT: No sore throat. Cardiovascular: Denies chest pain. Respiratory: Denies shortness of breath. Gastrointestinal: No abdominal pain.  No nausea, no vomiting.  No diarrhea. Genitourinary: Positive for dysuria. Musculoskeletal: Negative for acute arthralgias Skin: Negative for rash. Neurological: Negative for headaches, weakness/numbness/paresthesias in any extremity Psychiatric: Negative for suicidal ideation/homicidal ideation   ____________________________________________   PHYSICAL EXAM:  VITAL SIGNS: ED Triage Vitals [03/04/21 1503]  Enc Vitals Group     BP 104/61     Pulse Rate 84     Resp 17     Temp 97.6 F (36.4 C)     Temp Source Oral     SpO2 96 %     Weight 172 lb (78 kg)     Height 5\' 7"  (1.702 m)     Head Circumference      Peak Flow      Pain Score 0     Pain Loc      Pain Edu?      Excl. in [place]?    Constitutional: Alert and oriented. Well appearing and in no acute distress. Eyes: Conjunctivae are normal. PERRL. Head: Atraumatic. Nose: No congestion/rhinnorhea. Mouth/Throat: Mucous membranes are moist. Neck: No stridor Cardiovascular: Grossly normal heart sounds.  Good peripheral circulation. Respiratory: Normal respiratory effort.  No retractions. Gastrointestinal: Soft and nontender. No distention. Musculoskeletal: No obvious  deformities Neurologic:  Normal speech and language. No gross focal neurologic deficits are appreciated. Skin:  Skin is warm and dry. No rash noted. Psychiatric: Mood and affect are normal. Speech and behavior are normal.  ____________________________________________   LABS (all labs ordered are listed, but only abnormal results are displayed)  Labs Reviewed  RESP PANEL BY RT-PCR (FLU A&B, COVID) [id number]   RADIOLOGY  ED MD interpretation: CT of the abdomen and pelvis without IV contrast shows  no evidence of acute abnormalities.  There is incidental findings of cholelithiasis, bilateral renal cysts, and diverticulosis  Official radiology report(s): CT Abdomen Pelvis Wo Contrast  Result Date: 03/04/2021 CLINICAL DATA:  Abdominal distension EXAM: CT ABDOMEN AND PELVIS WITHOUT CONTRAST TECHNIQUE: Multidetector CT imaging of the abdomen and pelvis was performed following the standard protocol without IV contrast. COMPARISON:  07/18/2009 FINDINGS: Lower chest: Scattered calcified granulomas are noted without focal infiltrate. Hepatobiliary: Liver is within normal limits. Gallbladder is decompressed but demonstrates multiple gallstones within. No ductal dilatation is seen. Pancreas: Unremarkable. No pancreatic ductal dilatation or surrounding inflammatory changes. Spleen: Normal in size without focal abnormality. Adrenals/Urinary Tract: Adrenal glands are within normal limits. Kidneys demonstrate no renal calculi or urinary tract obstructive changes. Bilateral renal cystic change is noted increased in appearance when compared with the prior exam. No complicating factors of the cysts are noted. The largest of these lies in the left lower pole measuring 7.9 cm. Bladder is incompletely distended but shows no acute abnormality. Stomach/Bowel: Diverticular change of the colon is noted. No evidence of diverticulitis is seen. The appendix is within normal limits. Small bowel and stomach appear within normal  limits. Vascular/Lymphatic: Aortic atherosclerosis. No enlarged abdominal or pelvic lymph nodes. Reproductive: Prostate is prominent indenting upon the inferior aspect of the bladder. Other: No abdominal wall hernia or abnormality. No abdominopelvic ascites. Musculoskeletal: Degenerative changes of lumbar spine are noted. IMPRESSION: Cholelithiasis without complicating factors. Bilateral renal cysts progressed when compared with the prior exam as described. Diverticulosis without diverticulitis. Prominent prostate. Aortic Atherosclerosis (ICD10-I70.0). Electronically Signed   By: Inez Catalina M.D.   On: 03/04/2021 19:17    ____________________________________________   PROCEDURES  Procedure(s) performed (including Critical Care):  .1-3 Lead EKG Interpretation Performed by: Naaman Plummer, MD Authorized by: Naaman Plummer, MD     Interpretation: normal     ECG rate:  76   ECG rate assessment: normal     Rhythm: sinus rhythm     Ectopy: none     Conduction: normal       ____________________________________________   INITIAL IMPRESSION / ASSESSMENT AND PLAN / ED COURSE  As part of my medical decision making, I reviewed the following data within the [facility] notes reviewed and incorporated, Labs reviewed, EKG interpreted, Old chart reviewed, Radiograph reviewed and Notes from prior ED visits reviewed and incorporated        No e/o epididymo-orchitis on exam and low suspicion for rectal abscess, prostatitis, other GU deep space infection, gonorrhea/chlamydia. Unlikely Infected Urolithiasis, AAA, cholecystitis, pancreatitis, SBO, appendicitis, or other acute abdomen. Workup: UA: None Rx: Cefdinir 300 mg twice daily x5 days  Disposition: Discharge home. SRP discussed. Advise follow up with primary care provider within 24-72 hours.      ____________________________________________   FINAL CLINICAL IMPRESSION(S) / ED DIAGNOSES  Final diagnoses:   Acute cystitis with hematuria     ED Discharge Orders         Ordered    cefdinir (OMNICEF) 300 MG capsule  Daily        03/04/21 1926    phenazopyridine (PYRIDIUM) 95 MG tablet  3 times daily PRN        03/04/21 1926           Note:  This document was prepared using Dragon voice recognition software and may include unintentional dictation errors.   Naaman Plummer, MD 03/04/21 2352

## 2021-03-04 NOTE — ED Triage Notes (Signed)
Patient states that he has been feeling bad since Monday. States that he has had fever, urge to urinate but decreased output. States that he has been able to urinate some today without issues. States that he has been having body aches and chills with some dizziness at times. Patient denies any pain currently. Patient son is also present in the room and is concerned about bun and creatinine levels that were drawn on Friday.

## 2021-03-04 NOTE — ED Triage Notes (Signed)
Pt comes into the ED via POV where he was sent by UC.  Pt had urine and blood checked where they found him to have a kidney infection.  Pt states he has been able to void only small amounts at a time and his bladder was palpable to the top of his umbilicus at their office.  Pt ambulatory to triage and in NAD with even and unlabored respirations.

## 2021-03-04 NOTE — Telephone Encounter (Signed)
Based on his constellation of symptoms, I am concerned about urinary retention leading to urinary tract infection that could quickly spread to sepsis.  I agree with recommendation to go to the ED for further evaluation.  Nelva Bush, MD [facility]

## 2021-03-04 NOTE — ED Notes (Signed)
Patient is being discharged from the Urgent Care and sent to the Emergency Department via POV driven by son . Per Margarette Canada NP, patient is in need of higher level of care due to current symptoms and test results. Patient is aware and verbalizes understanding of plan of care.  Vitals:   03/04/21 1239  BP: 112/63  Pulse: 71  Resp: 18  Temp: 98.2 F (36.8 C)  SpO2: 98%

## 2021-03-04 NOTE — ED Provider Notes (Signed)
[facility]    CSN: [id number] Arrival date & time: 03/04/21  1209      History   Chief Complaint Chief Complaint  Patient presents with  . Fever    HPI Rodney Wiley is a 85 y.o. male.   HPI   85 year old male here for evaluation of urinary complaints.  Patient is here with his son who reports that he started feeling bad 2 days ago.  Yesterday he had a fever up to 101 with associated symptoms of chills, dizziness, fatigue, and decreased appetite.  Patient is also complaining of pain with urination as well as cloudy urine.  Patient denies body aches.  Patient states that he has not seen any blood in his urine he has not had any back pain, no nausea, vomiting, or abdominal pain and no swelling of his extremities.  Patient reports that he does not feel like he completely empties his bladder when he uses the restroom.  Patient reports that last night he went to the bathroom approximately 13 times in which is able to void little amounts each time.  Past Medical History:  Diagnosis Date  . Anemia   . Aortic insufficiency    a. 02/2018 s/p bioprosthetic AVR; 04/2018 Echo: EF 50-55%, Gr2 DD, Ao bioprosthesis, mean grad 53mmHg, Ao root/Asc Ao nl in size, mild MR, mildly dil LA, nl RV fxn. Nl PASP.  [name] Kitchen Arthropathy   . Ascending aortic aneurysm ([place])    a. 12/2016 MRA: 4.3cm @ sinus of valsalva, 4.9cm above sinotubular jxn; b. 02/2018 s/p biological Bentall and AVR.  [name] Kitchen BPH (benign prostatic hyperplasia)   . 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 02/2018 Cath: LAD 20ost, 40p, 62m, LCX 60p, OM2 60, OM3 85, RCA 10p/m w/ L->R and R->R collats;  b. 02/2018 CABG x 3: LIMA to LAD, SVG to OM2, SVG to PDA, EVH via right thigh.  . CKD (chronic kidney disease), stage III ([place])   . Diverticulitis   . Essential hypertension   . GERD (gastroesophageal reflux disease)   . Hemorrhoids   . History of chicken pox   . History of Helicobacter pylori infection 06/2007  . Hyperlipidemia   . Hypertension    . Left renal artery stenosis (HCC)    a. 01/2017 RA u/s: moderate L RAS.  [name] Kitchen Lower extremity edema   . Nonrheumatic mitral (valve) insufficiency    a. 12/2016 Echo: mild to mod MR; b. 09/2017 TEE: mild to mod MR; c. 04/2018 Echo: Mild MR.  [name] Kitchen Nonrheumatic pulmonary valve insufficiency   . 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 s/p 21 mm Edwards Inspiris Resilia stented bovine pericardial tissue valve and 24 mm Gelweave Valsalva synthetic root conduit with reimplantation of left main coronary artery.    Patient Active Problem List   Diagnosis Date Noted  . Chronic kidney disease, stage IV (severe) ([place]) 11/13/2020  . Elevated PSA 09/15/2020  . Benign prostatic hyperplasia without lower urinary tract symptoms 09/15/2020  . Anemia in chronic kidney disease 05/20/2020  . PSVT (paroxysmal supraventricular tachycardia) ([place]) 12/11/2018  . Chronic heart failure with preserved ejection fraction (HFpEF) ([place]) 12/11/2018  . Paroxysmal atrial fibrillation ([place]) 05/17/2018  . Chronic diastolic heart failure ([place]) 05/17/2018  . Medication management 05/17/2018  . History of aortic valve replacement with bioprosthetic valve 02/22/2018  . S/P CABG x 3 02/22/2018  . S/P ascending aortic aneurysm repair 02/22/2018  . Coronary artery  disease involving native coronary artery of native heart without angina pectoris   . First degree AV block 12/21/2017  . Chronic kidney disease, stage III (moderate) ([place]) 03/22/2017  . Thoracic aortic aneurysm without rupture ([place]) 01/27/2017  . Renal artery stenosis ([place]) 01/27/2017  . Hyperlipidemia LDL goal <70 01/27/2017  . Nonrheumatic aortic valve insufficiency 10/27/2016  . Mitral regurgitation 10/27/2016  . Essential hypertension 02/08/2007  . HYPERTENSION, BENIGN SYSTEMIC 02/02/2007    Past Surgical History:  Procedure Laterality Date  . AORTIC VALVE REPAIR N/A 02/22/2018   Procedure:  AORTIC VALVE REPLACEMENT -Biological Bentall aortic root replacement,;  Surgeon: Rexene Alberts, MD;  Location: [place];  Service: Open Heart Surgery;  Laterality: N/A;  . bypass surgery    . CARDIAC CATHETERIZATION     01/16/2018  . COLONOSCOPY  2011   by Dr. Candace Cruise with findings of diverticulosis  . CORONARY ARTERY BYPASS GRAFT N/A 02/22/2018   Procedure: CORONARY ARTERY BYPASS GRAFTING (CABG) x three, using left internal mammary artery and right leg greater saphenous vein harvested endoscopically;  Surgeon: Rexene Alberts, MD;  Location: [place];  Service: Open Heart Surgery;  Laterality: N/A;  . CORONARY/GRAFT ANGIOGRAPHY N/A 01/16/2018   Procedure: CORONARY/GRAFT ANGIOGRAPHY;  Surgeon: Nelva Bush, MD;  Location: [facility];  Service: Cardiovascular;  Laterality: N/A;  . ELBOW SURGERY Left   . hemorhoidectomy    . RIGHT HEART CATH N/A 01/16/2018   Procedure: RIGHT HEART CATH;  Surgeon: Nelva Bush, MD;  Location: [facility];  Service: Cardiovascular;  Laterality: N/A;  . SHOULDER ARTHROSCOPY WITH ROTATOR CUFF REPAIR Right   . SHOULDER SURGERY Right   . TEE WITHOUT CARDIOVERSION N/A 09/27/2017   Procedure: TRANSESOPHAGEAL ECHOCARDIOGRAM (TEE);  Surgeon: Dorothy Spark, MD;  Location: [facility];  Service: Cardiovascular;  Laterality: N/A;  . TEE WITHOUT CARDIOVERSION N/A 02/22/2018   Procedure: TRANSESOPHAGEAL ECHOCARDIOGRAM (TEE);  Surgeon: Rexene Alberts, MD;  Location: [place];  Service: Open Heart Surgery;  Laterality: N/A;  . THORACIC AORTIC ANEURYSM REPAIR N/A 02/22/2018   Procedure: THORACIC ASCENDING ANEURYSM REPAIR (AAA) Resection of ascending aorta aneurysm;  Surgeon: Rexene Alberts, MD;  [facility];  Service: Open Heart Surgery;  Laterality: N/A;       Home Medications    Prior to Admission medications   Medication Sig Start Date End Date Taking? Authorizing Provider  amiodarone (PACERONE) 200 MG tablet Take 1 tablet (200 mg total) by mouth 2  (two) times daily. 02/27/21  Yes End, Harrell Gave, MD  amLODipine (NORVASC) 10 MG tablet TAKE ONE TABLET BY MOUTH DAILY 12/15/20  Yes End, Harrell Gave, MD  ascorbic acid (VITAMIN C) 500 MG tablet Take 500 mg by mouth daily.   Yes [provider]  aspirin EC 81 MG tablet Take 81 mg by mouth daily.   Yes [provider]  atorvastatin (LIPITOR) 20 MG tablet TAKE ONE TABLET BY MOUTH DAILY 05/30/20  Yes End, Harrell Gave, MD  carvedilol (COREG) 12.5 MG tablet Take 1 tablet (12.5 mg total) by mouth 2 (two) times daily. 12/15/20  Yes End, Harrell Gave, MD  cholecalciferol (VITAMIN D3) 25 MCG (1000 UT) tablet Take 1,000 Units by mouth daily.   Yes [provider]  doxazosin (CARDURA) 4 MG tablet TAKE ONE TABLET BY MOUTH DAILY 11/12/20  Yes End, Harrell Gave, MD  FARXIGA 10 MG TABS tablet Take 10 mg by mouth daily. 10/29/20  Yes [provider]  ferrous sulfate 325 (65 FE) MG EC tablet Take  325 mg by mouth daily with breakfast.    Yes [provider]  hydrALAZINE (APRESOLINE) 100 MG tablet TAKE 1 TABLET BY MOUTH 3 TIMES DAILY. 01/12/21  Yes End, Harrell Gave, MD  isosorbide mononitrate (IMDUR) 30 MG 24 hr tablet Take 1 tablet (30 mg total) by mouth daily. 05/07/20  Yes End, Harrell Gave, MD  nitroGLYCERIN (NITROSTAT) 0.4 MG SL tablet Place 1 tablet (0.4 mg total) under the tongue every 5 (five) minutes as needed for chest pain. Maximum of 3 doses. 11/12/20  Yes End, Harrell Gave, MD  omeprazole (PRILOSEC) 20 MG capsule Take 20 mg by mouth daily.   Yes [provider]  torsemide (DEMADEX) 20 MG tablet Take 0.5 tablets (10 mg total) by mouth daily. 06/20/20 12/17/20 Yes Loel Dubonnet, NP    Family History Family History  Problem Relation Age of Onset  . Hypertension Mother   . Stroke Mother   . Leukemia Father   . Heart attack Paternal Grandmother   . Stroke Paternal Grandfather   . Heart Problems Son 58  . Heart Problems Son 3  . Uterine cancer Sister   .  Prostate cancer Brother   . Colon cancer Brother   . Kidney failure Brother   . Stroke Sister     Social History Social History   Tobacco Use  . Smoking status: Never Smoker  . Smokeless tobacco: Never Used  Vaping Use  . Vaping Use: Never used  Substance Use Topics  . Alcohol use: No  . Drug use: No     Allergies   Patient has no known allergies.   Review of Systems Review of Systems  Constitutional: Positive for activity change, appetite change, chills, fatigue and fever.  Gastrointestinal: Negative for abdominal pain, nausea and vomiting.  Genitourinary: Positive for difficulty urinating, dysuria, frequency and urgency. Negative for hematuria.  Musculoskeletal: Negative for back pain.  Neurological: Positive for dizziness. Negative for syncope.  Hematological: Negative.   Psychiatric/Behavioral: Negative.      Physical Exam Triage Vital Signs ED Triage Vitals [03/04/21 1236]  Enc Vitals Group     BP      Pulse      Resp      Temp      Temp src      SpO2      Weight 172 lb 6.4 oz (78.2 kg)     Height      Head Circumference      Peak Flow      Pain Score 0     Pain Loc      Pain Edu?      Excl. in [place]?    No data found.  Updated Vital Signs BP 112/63 (BP Location: Right Arm)   Pulse 71   Temp 98.2 F (36.8 C) (Oral)   Resp 18   Wt 172 lb 6.4 oz (78.2 kg)   SpO2 98%   BMI 27.00 kg/m   Visual Acuity Right Eye Distance:   Left Eye Distance:   Bilateral Distance:    Right Eye Near:   Left Eye Near:    Bilateral Near:     Physical Exam Vitals and nursing note reviewed.  Constitutional:      Appearance: Normal appearance.  HENT:     Head: Normocephalic and atraumatic.  Cardiovascular:     Rate and Rhythm: Normal rate and regular rhythm.     Pulses: Normal pulses.  Pulmonary:     Effort: Pulmonary effort is normal.  Breath sounds: Normal breath sounds. No wheezing, rhonchi or rales.  Abdominal:     General: Bowel sounds are  normal. There is distension.     Palpations: Abdomen is soft.     Tenderness: There is no abdominal tenderness.  Skin:    General: Skin is warm and dry.     Capillary Refill: Capillary refill takes less than 2 seconds.     Findings: No erythema.  Neurological:     General: No focal deficit present.     Mental Status: He is alert and oriented to person, place, and time.  Psychiatric:        Mood and Affect: Mood normal.        Behavior: Behavior normal.        Thought Content: Thought content normal.        Judgment: Judgment normal.      UC Treatments / Results  Labs (all labs ordered are listed, but only abnormal results are displayed) Labs Reviewed  URINALYSIS, COMPLETE (UACMP) WITH MICROSCOPIC - Abnormal; Notable for the following components:      Result Value   APPearance HAZY (*)    Glucose, UA 100 (*)    Hgb urine dipstick TRACE (*)    Protein, ur 100 (*)    Leukocytes,Ua LARGE (*)    Bacteria, UA MANY (*)    All other components within normal limits  CBC WITH DIFFERENTIAL/PLATELET - Abnormal; Notable for the following components:   WBC 11.9 (*)    RBC 3.15 (*)    Hemoglobin 10.1 (*)    HCT 30.0 (*)    Platelets 115 (*)    Neutro Abs 10.1 (*)    Abs Immature Granulocytes 0.10 (*)    All other components within normal limits  COMPREHENSIVE METABOLIC PANEL - Abnormal; Notable for the following components:   Sodium 134 (*)    Potassium 3.4 (*)    Glucose, Bld 106 (*)    BUN 50 (*)    Creatinine, Ser 2.90 (*)    Calcium 8.2 (*)    Total Bilirubin 1.4 (*)    GFR, Estimated 21 (*)    All other components within normal limits  URINE CULTURE    EKG   Radiology No results found.  Procedures Procedures (including critical care time)  Medications Ordered in UC Medications - No data to display  Initial Impression / Assessment and Plan / UC Course  I have reviewed the triage vital signs and the nursing notes.  Pertinent labs & imaging results that were  available during my care of the patient were reviewed by me and considered in my medical decision making (see chart for details).   Patient is here for evaluation of urinary complaints that have been going on for the past 2 days.  Patient called and spoke to urology yesterday and was advised to go the emergency department and then spoke to cardiology today who also advised him to go to the ER but when family said he would not wait they then instructed him to come to urgent care for evaluation.  Patient states that he has been feeling bad for the past 3 days and had a fever of 101 yesterday.  Patient does appear to not feel well.  He reports that he has had difficulty urinating and will only go small amounts each time and does not feel he complete empties his bladder when he voids.  Patient states that he has had some cloudiness to his urine and  it hurts to pee but he has not seen any blood in his urine.  Family is concerned because he had blood drawn 5 days ago that showed a BUN of 58 and a creatinine of 2.46.  Previous labs from 11/20/2020 showed BUN of 37 and a creatinine of 1.99.  Patient reports that he has been drinking 4 bottles of water daily.  Patient was able to provide a urine sample in clinic which is very cloudy and yellow in appearance.  Physical exam reveals clear lung sounds in all fields.  Abdomen is soft, nontender, with positive bowel sounds in all 4 quadrants.  Patient's bladder is palpable approximately 4 cm below the umbilicus and is nontender to palpation.  Concern for acute urine retention.  Will check UA, CBC, and CMP.  UA shows 100 glucose, trace hemoglobin, 100 protein, large leukocytes, greater than 50 WBCs, and many bacteria.  No squamous epithelials are nitrites present.  Will send urine for culture.  CBC shows an elevated white count of 11.9, RBC count 3.15, H&H 10.1 and 30.  Platelets 115.  This is a mild decrease from 5 days ago but it is consistent with values from the end of  January of this year.  CMP shows mild hyponatremia at 134, mild hypokalemia at 3.4, BUN has mildly improved at 50 and creatinine is worse at 2.90.  Patient's GFR has also decreased to 21 from 26.  He needs to be evaluated in the emergency department due to his UTI, incomplete bladder emptying and urine retention, and his worsening renal function.  Patient is agreeable to going to the emergency department and will go to [facility].  Patient is stable and is going by car with his son driving.  Report called to [facility] the charge nurse at [facility].   Final Clinical Impressions(s) / UC Diagnoses   Final diagnoses:  Elevated serum creatinine  Acute cystitis without hematuria  Acute retention of urine     Discharge Instructions     As we discussed, your kidney function is worsening, you are not completely emptying your bladder, and you have a urinary tract infection.  It is possible that the worsening kidney function is due to the fact that you cannot empty your bladder fully and is causing back pressure on your kidneys.  Due to these above issues I am recommending that she go to the emergency department at [facility] for further evaluation and possible admission.    ED Prescriptions    None     PDMP not reviewed this encounter.   Margarette Canada, NP 03/04/21 1419

## 2021-03-04 NOTE — ED Notes (Signed)
Bladder Scan= 72 ML

## 2021-03-04 NOTE — Telephone Encounter (Signed)
Spoke with patients daughter in law per release form. She reports patient has had a fever up to 101 and some weakness which is out of his normal. She reports he is not his normal self and requested that she call us about this. She states that he downplays his symptoms but today he was worried and requested her to call us. Daughter in law was concerned about labs and previous EKG. Reviewed lab results and recommendations by Dr. Saunders Revel and EKG findings he reviewed at last visit. Dr. Bernardo Heater was also notified of his symptoms as well and they recommended he go to ED for further evaluation. Encouraged reaching out to his primary care provider for further recommendations as well. She states patient does not want ED visit. Upon further discussion reviewed that Urgent Care visit may be helpful because we need to find out the cause of his fever. Reviewed two local Urgent care options and that they could complete evaluation and treatment of any acute processes. She verbalized understanding of our conversation, agreement with plan, and had no further questions at this time.

## 2021-03-04 NOTE — Telephone Encounter (Signed)
Pt daughter in law (Ms. Marez) called the office seeking advise of pt having fever of 101 last night, recent labs indicate a BUN level of 58 and pt dehydrated per cardiologist. pt states he got up at least 13 times last night with very little output, spoke to Dr. Bernardo Heater whom recommends pt go to ED.

## 2021-03-07 LAB — URINE CULTURE
Culture: >=100000 — AB
Special Requests: NORMAL

## 2021-03-10 ENCOUNTER — Other Ambulatory Visit: Payer: Self-pay

## 2021-03-10 DIAGNOSIS — R972 Elevated prostate specific antigen [PSA]: Secondary | ICD-10-CM

## 2021-03-16 ENCOUNTER — Other Ambulatory Visit: Admission: RE | Admit: 2021-03-16 | Payer: Medicare HMO | Source: Ambulatory Visit

## 2021-03-16 ENCOUNTER — Other Ambulatory Visit: Payer: Medicare HMO

## 2021-03-16 ENCOUNTER — Other Ambulatory Visit: Payer: Self-pay

## 2021-03-16 DIAGNOSIS — R972 Elevated prostate specific antigen [PSA]: Secondary | ICD-10-CM

## 2021-03-17 ENCOUNTER — Ambulatory Visit (INDEPENDENT_AMBULATORY_CARE_PROVIDER_SITE_OTHER): Payer: Medicare HMO

## 2021-03-17 DIAGNOSIS — I471 Supraventricular tachycardia: Secondary | ICD-10-CM

## 2021-03-17 LAB — ECHOCARDIOGRAM COMPLETE
AR max vel: 1.12 cm2
AV Area VTI: 1.02 cm2
AV Area mean vel: 1.02 cm2
AV Mean grad: 20.5 mmHg
AV Peak grad: 33.2 mmHg
Ao pk vel: 2.88 m/s
Area-P 1/2: 2.42 cm2
MV M vel: 5 m/s
MV Peak grad: 100 mmHg
MV VTI: 1.48 cm2
S' Lateral: 2.5 cm

## 2021-03-17 LAB — PSA: Prostate Specific Ag, Serum: 23.6 ng/mL — ABNORMAL HIGH (ref 0.0–4.0)

## 2021-03-19 ENCOUNTER — Ambulatory Visit: Payer: Medicare HMO | Admitting: Urology

## 2021-03-19 ENCOUNTER — Encounter: Payer: Self-pay | Admitting: Urology

## 2021-03-19 ENCOUNTER — Other Ambulatory Visit: Payer: Self-pay

## 2021-03-19 VITALS — BP 104/61 | HR 58 | Ht 67.0 in | Wt 172.0 lb

## 2021-03-19 DIAGNOSIS — N410 Acute prostatitis: Secondary | ICD-10-CM

## 2021-03-19 DIAGNOSIS — R972 Elevated prostate specific antigen [PSA]: Secondary | ICD-10-CM | POA: Diagnosis not present

## 2021-03-19 MED ORDER — SULFAMETHOXAZOLE-TRIMETHOPRIM 800-160 MG PO TABS: 1.0000 | ORAL_TABLET | Freq: Two times a day (BID) | ORAL | 0 refills | Status: AC

## 2021-03-19 NOTE — Progress Notes (Signed)
03/19/2021 8:19 AM   Rodney Wiley May 28, 1936 [id number]  Referring provider: Remi Wiley, [place],  [place] [address]  Chief Complaint  Patient presents with  . Elevated PSA    HPI: 85 y.o. male presents for follow-up of a recent [facility] ED visit.   Initially seen 09/15/2020 to establish care for an elevated PSA  Presented to [facility] 03/04/2021 complaining of a 2-day history of malaise, chills, fatigue and fever to 101 degrees associated with dysuria and cloudy urine  Urinalysis with pyuria/bacteriuria, mild leukocytosis 11.9, creatinine 2.90 and question of urinary retention and it was recommended he be further evaluated in the [facility] ED  CT abdomen/pelvis performed which showed no significant abnormalities.  Bilateral simple renal cysts noted as well as prostate enlargement.  The bladder was not distended.  He was discharged on 5-day course of cefdinir  Urine culture grew >100,000 pansensitive E. Coli  Feeling much better with resolution of symptoms  He was due for a follow-up PSA this month which was drawn at a lab visit appointment on 03/16/2021 and was elevated at 23.6   PMH: Past Medical History:  Diagnosis Date  . Anemia   . Aortic insufficiency    a. 02/2018 s/p bioprosthetic AVR; 04/2018 Echo: EF 50-55%, Gr2 DD, Ao bioprosthesis, mean grad 45mmHg, Ao root/Asc Ao nl in size, mild MR, mildly dil LA, nl RV fxn. Nl PASP.  [name] Kitchen Arthropathy   . Ascending aortic aneurysm ([place])    a. 12/2016 MRA: 4.3cm @ sinus of valsalva, 4.9cm above sinotubular jxn; b. 02/2018 s/p biological Bentall and AVR.  [name] Kitchen BPH (benign prostatic hyperplasia)   . 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 02/2018 Cath: LAD 20ost, 40p, 16m, LCX 60p, OM2 60, OM3 85, RCA 10p/m w/ L->R and R->R collats;  b. 02/2018 CABG x 3: LIMA to LAD, SVG to OM2, SVG to PDA, EVH via right thigh.  . CKD (chronic kidney disease), stage III ([place])   . Diverticulitis   . Essential hypertension   . GERD  (gastroesophageal reflux disease)   . Hemorrhoids   . History of chicken pox   . History of Helicobacter pylori infection 06/2007  . Hyperlipidemia   . Hypertension   . Left renal artery stenosis (HCC)    a. 01/2017 RA u/s: moderate L RAS.  [name] Kitchen Lower extremity edema   . Nonrheumatic mitral (valve) insufficiency    a. 12/2016 Echo: mild to mod MR; b. 09/2017 TEE: mild to mod MR; c. 04/2018 Echo: Mild MR.  [name] Kitchen Nonrheumatic pulmonary valve insufficiency   . 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 s/p 21 mm Edwards Inspiris Resilia stented bovine pericardial tissue valve and 24 mm Gelweave Valsalva synthetic root conduit with reimplantation of left main coronary artery.    Surgical History: Past Surgical History:  Procedure Laterality Date  . AORTIC VALVE REPAIR N/A 02/22/2018   Procedure: AORTIC VALVE REPLACEMENT -Biological Bentall aortic root replacement,;  Surgeon: Rexene Alberts, MD;  Location: [place];  Service: Open Heart Surgery;  Laterality: N/A;  . bypass surgery    . CARDIAC CATHETERIZATION     01/16/2018  . COLONOSCOPY  2011   by Dr. Candace Cruise with findings of diverticulosis  . CORONARY ARTERY BYPASS GRAFT N/A 02/22/2018   Procedure: CORONARY ARTERY BYPASS GRAFTING (CABG) x three, using left internal mammary artery and right leg greater saphenous vein harvested endoscopically;  Surgeon:  Rexene Alberts, MD;  Location: [place];  Service: Open Heart Surgery;  Laterality: N/A;  . CORONARY/GRAFT ANGIOGRAPHY N/A 01/16/2018   Procedure: CORONARY/GRAFT ANGIOGRAPHY;  Surgeon: Nelva Bush, MD;  Location: [place] [facility];  Service: Cardiovascular;  Laterality: N/A;  . ELBOW SURGERY Left   . hemorhoidectomy    . RIGHT HEART CATH N/A 01/16/2018   Procedure: RIGHT HEART CATH;  Surgeon: Nelva Bush, MD;  Location: [place] [facility];  Service: Cardiovascular;  Laterality: N/A;  . SHOULDER ARTHROSCOPY WITH ROTATOR CUFF  REPAIR Right   . SHOULDER SURGERY Right   . TEE WITHOUT CARDIOVERSION N/A 09/27/2017   Procedure: TRANSESOPHAGEAL ECHOCARDIOGRAM (TEE);  Surgeon: Dorothy Spark, MD;  Location: [facility];  Service: Cardiovascular;  Laterality: N/A;  . TEE WITHOUT CARDIOVERSION N/A 02/22/2018   Procedure: TRANSESOPHAGEAL ECHOCARDIOGRAM (TEE);  Surgeon: Rexene Alberts, MD;  Location: [place];  Service: Open Heart Surgery;  Laterality: N/A;  . THORACIC AORTIC ANEURYSM REPAIR N/A 02/22/2018   Procedure: THORACIC ASCENDING ANEURYSM REPAIR (AAA) Resection of ascending aorta aneurysm;  Surgeon: Rexene Alberts, MD;  Location: [facility];  Service: Open Heart Surgery;  Laterality: N/A;    Home Medications:  Allergies as of 03/19/2021   No Known Allergies     Medication List       Accurate as of March 19, 2021  8:19 AM. If you have any questions, ask your nurse or doctor.        amiodarone 200 MG tablet Commonly known as: PACERONE Take 1 tablet (200 mg total) by mouth 2 (two) times daily.   amLODipine 10 MG tablet Commonly known as: NORVASC TAKE ONE TABLET BY MOUTH DAILY   ascorbic acid 500 MG tablet Commonly known as: VITAMIN C Take 500 mg by mouth daily.   aspirin EC 81 MG tablet Take 81 mg by mouth daily.   atorvastatin 20 MG tablet Commonly known as: LIPITOR TAKE ONE TABLET BY MOUTH DAILY   carvedilol 12.5 MG tablet Commonly known as: COREG Take 1 tablet (12.5 mg total) by mouth 2 (two) times daily.   cholecalciferol 25 MCG (1000 UNIT) tablet Commonly known as: VITAMIN D3 Take 1,000 Units by mouth daily.   doxazosin 4 MG tablet Commonly known as: CARDURA TAKE ONE TABLET BY MOUTH DAILY   Farxiga 10 MG Tabs tablet Generic drug: dapagliflozin propanediol Take 10 mg by mouth daily.   ferrous sulfate 325 (65 FE) MG EC tablet Take 325 mg by mouth daily with breakfast.   hydrALAZINE 100 MG tablet Commonly known as: APRESOLINE TAKE 1 TABLET BY MOUTH 3 TIMES DAILY.   isosorbide  mononitrate 30 MG 24 hr tablet Commonly known as: IMDUR Take 1 tablet (30 mg total) by mouth daily.   nitroGLYCERIN 0.4 MG SL tablet Commonly known as: Nitrostat Place 1 tablet (0.4 mg total) under the tongue every 5 (five) minutes as needed for chest pain. Maximum of 3 doses.   omeprazole 20 MG capsule Commonly known as: PRILOSEC Take 20 mg by mouth daily.   phenazopyridine 95 MG tablet Commonly known as: PYRIDIUM Take 1 tablet (95 mg total) by mouth 3 (three) times daily as needed for pain.   torsemide 20 MG tablet Commonly known as: DEMADEX Take 0.5 tablets (10 mg total) by mouth daily.       Allergies: No Known Allergies  Family History: Family History  Problem Relation Age of Onset  . Hypertension Mother   . Stroke Mother   . Leukemia Father   .  Heart attack Paternal Grandmother   . Stroke Paternal Grandfather   . Heart Problems Son 61  . Heart Problems Son 30  . Uterine cancer Sister   . Prostate cancer Brother   . Colon cancer Brother   . Kidney failure Brother   . Stroke Sister     Social History:  reports that he has never smoked. He has never used smokeless tobacco. He reports that he does not drink alcohol and does not use drugs.   Physical Exam: BP 104/61   Pulse (!) 58   Ht 5\' 7"  (1.702 m)   Wt 172 lb (78 kg)   BMI 26.94 kg/m   Constitutional:  Alert and oriented, No acute distress. HEENT: [place] AT, moist mucus membranes.  Trachea midline, no masses. Cardiovascular: No clubbing, cyanosis, or edema. Respiratory: Normal respiratory effort, no increased work of breathing. Neurologic: Grossly intact, no focal deficits, moving all 4 extremities. Psychiatric: Normal mood and affect.   Pertinent Imaging: CT images from 03/04/2021 personally reviewed and interpreted   Assessment & Plan:    1.  Acute prostatitis  Discussed with [name] his symptoms were consistent with acute bacterial prostatitis and that a 5-day course of antibiotics is not  adequate treatment and he has it increased risk for recurrent infection and chronic nonbacterial prostatitis  Recommend additional 2 weeks course of antibiotic therapy and Rx Septra DS sent to pharmacy  2.  Elevated PSA  Drawn 4/11 and the significant bump due to UTI  Will repeat in 3 months    Abbie Sons, MD  [facility] [address], [place], [place] [address] [phone]

## 2021-03-20 ENCOUNTER — Ambulatory Visit (INDEPENDENT_AMBULATORY_CARE_PROVIDER_SITE_OTHER): Payer: Medicare HMO | Admitting: Family

## 2021-03-20 ENCOUNTER — Encounter: Payer: Self-pay | Admitting: Family

## 2021-03-20 ENCOUNTER — Ambulatory Visit: Payer: Self-pay | Admitting: Urology

## 2021-03-20 VITALS — BP 108/50 | HR 52 | Ht 67.0 in | Wt 179.0 lb

## 2021-03-20 DIAGNOSIS — I471 Supraventricular tachycardia, unspecified: Secondary | ICD-10-CM

## 2021-03-20 DIAGNOSIS — I5032 Chronic diastolic (congestive) heart failure: Secondary | ICD-10-CM | POA: Diagnosis not present

## 2021-03-20 DIAGNOSIS — I491 Atrial premature depolarization: Secondary | ICD-10-CM | POA: Diagnosis not present

## 2021-03-20 DIAGNOSIS — N184 Chronic kidney disease, stage 4 (severe): Secondary | ICD-10-CM

## 2021-03-20 DIAGNOSIS — I444 Left anterior fascicular block: Secondary | ICD-10-CM

## 2021-03-20 DIAGNOSIS — Z952 Presence of prosthetic heart valve: Secondary | ICD-10-CM | POA: Diagnosis not present

## 2021-03-20 DIAGNOSIS — I440 Atrioventricular block, first degree: Secondary | ICD-10-CM

## 2021-03-20 MED ORDER — AMIODARONE HCL 100 MG PO TABS: 100.0000 mg | ORAL_TABLET | Freq: Every day | ORAL | 5 refills | Status: DC

## 2021-03-20 NOTE — Patient Instructions (Addendum)
Medication Instructions:  Your physician has recommended you make the following change in your medication:   CHANGE Amiodarone to 100mg  twice daily *You may use up your 200mg  tablets by taking a half tablet twice per day*  *If you need a refill on your cardiac medications before your next appointment, please call your pharmacy*   Lab Work: Your provider recommends lab work today: BMP  If you have labs (blood work) drawn today and your tests are completely normal, you will receive your results only by: [name] Kitchen MyChart Message (if you have MyChart) OR . A paper copy in the mail If you have any lab test that is abnormal or we need to change your treatment, we will call you to review the results.   Testing/Procedures: Your EKG today shows sinus bradycardia which is a regular but slow heart rhythm.   Follow-Up: At [facility], you and your health needs are our priority.  As part of our continuing mission to provide you with exceptional heart care, we have created designated Provider Care Teams.  These Care Teams include your primary Cardiologist (physician) and Advanced Practice Providers (APPs -  Physician Assistants and Nurse Practitioners) who all work together to provide you with the care you need, when you need it.  We recommend signing up for the patient portal called "MyChart".  Sign up information is provided on this After Visit Summary.  MyChart is used to connect with patients for Virtual Visits (Telemedicine).  Patients are able to view lab/test results, encounter notes, upcoming appointments, etc.  Non-urgent messages can be sent to your provider as well.   To learn more about what you can do with MyChart, go to [email or web address].    Your next appointment:   3 week(s)  The format for your next appointment:   In Person  Provider:   You may see Nelva Bush, MD or one of the following Advanced Practice Providers on your designated Care Team:    Murray Hodgkins,  NP  Christell Faith, PA-C  Marrianne Mood, PA-C  Cadence Kathlen Mody, [place]  Laurann Montana, NP  Other Instructions  Heart Healthy Diet Recommendations: A low-salt diet is recommended. Meats should be grilled, baked, or boiled. Avoid fried foods. Focus on lean protein sources like fish or chicken with vegetables and fruits. The American Heart Association is a Microbiologist!  American Heart Association Diet and Lifeystyle Recommendations   Exercise recommendations: The American Heart Association recommends 150 minutes of moderate intensity exercise weekly. Try 30 minutes of moderate intensity exercise 4-5 times per week. This could include walking, jogging, or swimming.

## 2021-03-20 NOTE — Progress Notes (Signed)
Office Visit    Patient Name: Rodney Wiley Date of Encounter: 03/20/2021  PCP:  Remi Haggard, [place]  Cardiologist:  Nelva Bush, MD  Advanced Practice Provider:  No care team member to display Electrophysiologist:  None    Chief Complaint    EDDER Wiley is a 85 y.o. male with a hx of hypertension, coronary artery disease s/p CABG (02/2018), aortic regurgitation and thoracic aortic aneurysm s/p Bentall with bioprosthetic aortic valve replacement (02/2018), SVT, HFpEF, CKD 3 with questionable left renal artery stenosis presents today for follow-up after initiation of amiodarone  Past Medical History    Past Medical History:  Diagnosis Date  . Anemia   . Aortic insufficiency    a. 02/2018 s/p bioprosthetic AVR; 04/2018 Echo: EF 50-55%, Gr2 DD, Ao bioprosthesis, mean grad 5mmHg, Ao root/Asc Ao nl in size, mild MR, mildly dil LA, nl RV fxn. Nl PASP.  [name] Kitchen Arthropathy   . Ascending aortic aneurysm ([place])    a. 12/2016 MRA: 4.3cm @ sinus of valsalva, 4.9cm above sinotubular jxn; b. 02/2018 s/p biological Bentall and AVR.  [name] Kitchen BPH (benign prostatic hyperplasia)   . 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 02/2018 Cath: LAD 20ost, 40p, 23m, LCX 60p, OM2 60, OM3 85, RCA 10p/m w/ L->R and R->R collats;  b. 02/2018 CABG x 3: LIMA to LAD, SVG to OM2, SVG to PDA, EVH via right thigh.  . CKD (chronic kidney disease), stage III ([place])   . Diverticulitis   . Essential hypertension   . GERD (gastroesophageal reflux disease)   . Hemorrhoids   . History of chicken pox   . History of Helicobacter pylori infection 06/2007  . Hyperlipidemia   . Hypertension   . Left renal artery stenosis ([facility])    a. 01/2017 RA u/s: moderate L RAS.  [name] Kitchen Lower extremity edema   . Nonrheumatic mitral (valve) insufficiency    a. 12/2016 Echo: mild to mod MR; b. 09/2017 TEE: mild to mod MR; c. 04/2018 Echo: Mild MR.  [name] Kitchen Nonrheumatic pulmonary valve insufficiency   . S/P biological Bentall  aortic root replacement with bioprosthetic valve and synthetic root conduit 02/22/2018   a. s/p 21 mm Edwards Inspiris Resilia stented bovine pericardial tissue valve and 24 mm Gelweave Valsalva synthetic root conduit with reimplantation of left main coronary artery.   Past Surgical History:  Procedure Laterality Date  . AORTIC VALVE REPAIR N/A 02/22/2018   Procedure: AORTIC VALVE REPLACEMENT -Biological Bentall aortic root replacement,;  Surgeon: Rexene Alberts, MD;  Location: [place];  Service: Open Heart Surgery;  Laterality: N/A;  . bypass surgery    . CARDIAC CATHETERIZATION     01/16/2018  . COLONOSCOPY  2011   by Dr. Candace Cruise with findings of diverticulosis  . CORONARY ARTERY BYPASS GRAFT N/A 02/22/2018   Procedure: CORONARY ARTERY BYPASS GRAFTING (CABG) x three, using left internal mammary artery and right leg greater saphenous vein harvested endoscopically;  Surgeon: Rexene Alberts, MD;  Location: [place];  Service: Open Heart Surgery;  Laterality: N/A;  . CORONARY/GRAFT ANGIOGRAPHY N/A 01/16/2018   Procedure: CORONARY/GRAFT ANGIOGRAPHY;  Surgeon: Nelva Bush, MD;  Location: [place] [facility];  Service: Cardiovascular;  Laterality: N/A;  . ELBOW SURGERY Left   . hemorhoidectomy    . RIGHT HEART CATH N/A 01/16/2018   Procedure: RIGHT HEART CATH;  Surgeon: Nelva Bush, MD;  Location: [place] [facility];  Service: Cardiovascular;  Laterality:  N/A;  . SHOULDER ARTHROSCOPY WITH ROTATOR CUFF REPAIR Right   . SHOULDER SURGERY Right   . TEE WITHOUT CARDIOVERSION N/A 09/27/2017   Procedure: TRANSESOPHAGEAL ECHOCARDIOGRAM (TEE);  Surgeon: Dorothy Spark, MD;  Location: [facility] ENDOSCOPY;  Service: Cardiovascular;  Laterality: N/A;  . TEE WITHOUT CARDIOVERSION N/A 02/22/2018   Procedure: TRANSESOPHAGEAL ECHOCARDIOGRAM (TEE);  Surgeon: Rexene Alberts, MD;  Location: [place];  Service: Open Heart Surgery;  Laterality: N/A;  . THORACIC AORTIC ANEURYSM REPAIR N/A 02/22/2018   Procedure: THORACIC  ASCENDING ANEURYSM REPAIR (AAA) Resection of ascending aorta aneurysm;  Surgeon: Rexene Alberts, MD;  Location: [facility] OR;  Service: Open Heart Surgery;  Laterality: N/A;    Allergies  No Known Allergies  History of Present Illness    Rodney Wiley is a 85 y.o. 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 Saunders Revel.  When last seen his EKG showed sinus rhythm first-degree AV block and frequent PACs. He was started on Amiodarone 200mg  twice daily. Lab work performed 02/28/2019 2K3.4, creatinine 2.46, GFR 26, Hb 11.3, normal thyroid and magnesium.  Was recommended to hold torsemide for 1 day and increase fluid intake. Echocardiogram was ordered.  He was seen in the urgent care and subsequently sent to the ED for UTI, incomplete bladder emptying, urinary retention, worsening renal function.  Lab work 03/04/2021  K3.4, creatinine 9, GFR 21, Hb 10.1.  He with sent home with cefdinir 300 mg twice daily for 5 days as well as PRN Pyridium.   Echocardiogram 03/17/21 showed LVEF 60-65% (by 3D volume 69%), no RWMA, mild LVH, RVSF low normal, normal PASP, mild to moderate MR, bioprosthetic AV functioning appropriately with mean gradient 20.33mmHg, RA pressure 47mmHg.   He saw urology yesterday and urinary symptoms had improved an antibiotic course initiated as 5-day course of antibiotics with adequate treatment for acute prostatitis.  He presents today for follow up.  Reports no chest pain, pressure, tightness.  Reports shortness of breath at rest and no dyspnea on exertion.  Reports no lightheadedness, dizziness, near-syncope, syncope.  He has not been monitoring his blood pressure or heart rate routinely at home.  Notes he is tolerating the amiodarone without difficulty.  He is pleased  with the results of his echocardiogram which we reviewed.  EKGs/Labs/Other Studies Reviewed:   The following studies were reviewed today:  Cath/PCI:  Coronary angiography/RHC (01/16/2018): LMCA normal.  LAD with sequential 20% ostial, 40% proximal, and long 70% proximal to mid stenoses.  LCx with 60% proximal stenosis and 60 to 80-90% OM2 and OM3 lesions.  Chronic total occlusion of the proximal/mid RCA with left-to-right collaterals.  RA 9, RV 43/10, PA 37/15 (22), and PCWP 18.  Fick CO/CI 5.1/2.6.   CV Surgery:  CABG and Bentall with bioprosthetic AVR (02/22/2018, Dr. Roxy Manns): LIMA to LAD, SVG to OM2, and SVG to PDA.  24 mm synthetic aortic root graft and 21 mm Edwards Inspiris [place] stented bovine pericardial tissue valve.   EP Procedures and Devices:  Event monitor (01/22/19): Patient was monitored for 2 days, 22 hours.  Predominantly sinus rhythm with rare PAC's and frequent PVC's.  Several brief atrial runs were noted.  PVC burden 8.4%.   Non-Invasive Evaluation(s):  TTE (03/17/21): LVEF 60-65% (EF by 3D volume 69%). No RWMA. Mild LVH, indeterminate diastolic parameters.  RVSF low normal. Normal PASP. Mild to moderate MR. Normal structure and function of AV prosthesis (mean gradient 20.5 mmHg).  Renal artery Doppler (02/06/19): No evidence of significant renal artery stenosis affecting either kidney.  TTE (04/07/18): Normal LV size with mild LVH.  LVEF 50 to 55% with anteroseptal and septal hypokinesis.  Grade 2 diastolic dysfunction.  Bioprosthetic aortic valve present with mean gradient of 17 mmHg.  Mild mitral regurgitation.  Normal RV size and function.  No pericardial effusion.  TEE (09/27/17): Mildly dilated LV with LVEF 60-65%. Moderate aortic regurgitation. Mild to moderate mitral regurgitation. Mild right atrial enlargement. Moderate aortic dilation, with root measuring 5.0 cm and ascending aorta 4.7 cm  MRA chest (06/06/17): Stable ascending aortic aneurysm, measuring 5.0 cm at the  sinotubular junction and 4.8 cm at the level of the main pulmonary artery.  Renal artery Doppler (01/13/17): Aortic atherosclerosis without stenosis or aneurysm. Normal right renal artery. Mild to moderate left renal artery stenosis (1-59%). Normal/symmetric kidney size.  MRA chest (12/27/16): Aneurysmal disease of the ascending aorta, measuring 4.3 cm at the sinus of Valsalva and 4.9 cm above the sinotubular junction.  TTE (12/14/16): Mildly dilated LV with mild LVH. Normal contraction with LVEF 55-60%. Grade 1 diastolic dysfunction. Mild to moderate aortic regurgitation. Dilated aorta, measuring up to 4.9 cm above the sinotubular junction. Mild to moderate mitral regurgitation. Mild left atrial enlargement. Normal RV size and function.  Transthoracic echo (09/2016, [place]): Full report not available.  Reportedly showed LVH with LVEF 25% and diastolic dysfunction.  MR and moderate to severe AI present with sclerotic aortic valve.  Carotid Doppler (09/2016): Full report not available.  Reportedly showed 1-39% stenosis bilaterally.  Transthoracic echo (02/18/16, [facility]): Normal LV and RV size and function.  LVEF > 55%.  Moderate AI; mild MR and TR.  EKG:  EKG is  ordered today.  The ekg ordered today demonstrates sinus bradycardia 52 bpm first-degree AV block PR 356 and LAFB.  Stable Q waves in inferior leads.  No acute ST/T wave changes.  Recent Labs: 02/27/2021: Magnesium 2.1; TSH 3.473 03/04/2021: ALT 21; BUN 50; Creatinine, Ser 2.90; Hemoglobin 10.1; Platelets 115; Potassium 3.4; Sodium 134  Recent Lipid Panel    Component Value Date/Time   CHOL 132 09/15/2018 1115   TRIG 214 (H) 09/15/2018 1115   HDL 33 (L) 09/15/2018 1115   CHOLHDL 4.0 09/15/2018 1115   CHOLHDL 3.8 03/11/2017 0753   VLDL 39 03/11/2017 0753   LDLCALC 56 09/15/2018 1115    Home Medications   Current Meds  Medication Sig  . amiodarone (PACERONE) 100 MG tablet Take 1 tablet (100 mg total) by  mouth daily.  [name] Kitchen amLODipine (NORVASC) 10 MG tablet TAKE ONE TABLET BY MOUTH DAILY  . ascorbic acid (VITAMIN C) 500 MG tablet Take 500 mg by mouth daily.  [name] Kitchen aspirin EC 81 MG tablet Take 81 mg by mouth daily.  [name] Kitchen atorvastatin (LIPITOR) 20 MG tablet TAKE ONE TABLET BY MOUTH DAILY  . carvedilol (COREG) 12.5 MG tablet Take 1 tablet (12.5 mg total) by mouth 2 (two) times daily.  . cholecalciferol (VITAMIN D3) 25 MCG (1000 UT) tablet Take 1,000 Units by mouth daily.  [name] Kitchen doxazosin (CARDURA) 4 MG tablet TAKE ONE TABLET BY MOUTH DAILY  . FARXIGA 10 MG TABS tablet Take 10 mg by mouth daily.  . ferrous sulfate 325 (65 FE) MG EC tablet Take 325 mg by mouth daily with breakfast.   . hydrALAZINE (APRESOLINE) 100 MG  tablet TAKE 1 TABLET BY MOUTH 3 TIMES DAILY.  . isosorbide mononitrate (IMDUR) 30 MG 24 hr tablet Take 1 tablet (30 mg total) by mouth daily.  . nitroGLYCERIN (NITROSTAT) 0.4 MG SL tablet Place 1 tablet (0.4 mg total) under the tongue every 5 (five) minutes as needed for chest pain. Maximum of 3 doses.  [name] Kitchen omeprazole (PRILOSEC) 20 MG capsule Take 20 mg by mouth daily.  . phenazopyridine (PYRIDIUM) 95 MG tablet Take 1 tablet (95 mg total) by mouth 3 (three) times daily as needed for pain.  [name] Kitchen sulfamethoxazole-trimethoprim (BACTRIM DS) 800-160 MG tablet Take 1 tablet by mouth 2 (two) times daily for 14 days.  . [DISCONTINUED] amiodarone (PACERONE) 200 MG tablet Take 1 tablet (200 mg total) by mouth 2 (two) times daily.     Review of Systems  All other systems reviewed and are otherwise negative except as noted above.  Physical Exam    VS:  BP (!) 108/50 (BP Location: Left Arm, Patient Position: Sitting, Cuff Size: Normal)   Pulse (!) 52   Ht 5\' 7"  (1.702 m)   Wt 179 lb (81.2 kg)   SpO2 97%   BMI 28.04 kg/m  , BMI Body mass index is 28.04 kg/m.  Wt Readings from Last 3 Encounters:  03/20/21 179 lb (81.2 kg)  03/19/21 172 lb (78 kg)  03/04/21 172 lb (78 kg)     GEN: Well nourished, well  developed, in no acute distress. HEENT: normal. Neck: Supple, no JVD, carotid bruits, or masses. Cardiac: Bradycardic, RRR, no rubs, or gallops. EX5/1 systolic murmur. No clubbing, cyanosis.  Nonpitting bilateral pedal edema..  Radials/DP/PT 2+ and equal bilaterally.  Respiratory:  Respirations regular and unlabored, clear to auscultation bilaterally. GI: Soft, nontender, nondistended. MS: No deformity or atrophy. Skin: Warm and dry, no rash. Neuro:  Strength and sensation are intact. Psych: Normal affect.  Assessment & Plan    1. PSVT / PAC/PVC / LAFB  -tolerating amiodarone 200 mg twice daily without difficulty.  EKG today shows sinus bradycardia 52 bpm with first-degree AV block PR 356 and LAFB. LAFB is new compared to previous. No lightheadedness, dizziness, near-syncope, syncope.  Reduce amiodarone to 100 mg twice daily.  At follow-up if rate remains well controlled and no recurrent arrhythmia could consider reduction to 100 mg daily.  02/27/2021 normal TSH and liver function, plan to repeat a follow-up for monitoring after 6 weeks of amiodarone therapy.  Avoid further escalation of beta-blocker therapy due to first-degree AV block.  2. CAD s/p CABG-EKG today with stable inferior Q waves and no acute ST/T changes.  No anginal symptoms.  No indication for ischemic evaluation at this time.  GDMT includes beta-blocker, statin, aspirin, Imdur  3. Chronic HFpEF / AI s/p AVR -stable lower extremity edema with NYHA II symptoms.  Echocardiogram 03/2019 with normal LVEF and bioprosthetic aortic valve functioning appropriately.  Continue current medications and current diuretic regimen.  BMP today for monitoring of renal function.  4. CKD IV - Careful titration of diuretics and antihypertensives.  BMP today.  Of note, he is undergoing treatment for prostatitis with Augmentin.  Disposition: Follow up in 3 week(s) with Dr. Saunders Revel or APP   Signed, Loel Dubonnet, NP 03/20/2021, 4:10 PM [place]

## 2021-03-21 LAB — BASIC METABOLIC PANEL
BUN/Creatinine Ratio: 14 (ref 10–24)
BUN: 36 mg/dL — ABNORMAL HIGH (ref 8–27)
CO2: 15 mmol/L — ABNORMAL LOW (ref 20–29)
Calcium: 8 mg/dL — ABNORMAL LOW (ref 8.6–10.2)
Chloride: 106 mmol/L (ref 96–106)
Creatinine, Ser: 2.63 mg/dL — ABNORMAL HIGH (ref 0.76–1.27)
Glucose: 95 mg/dL (ref 65–99)
Potassium: 4.2 mmol/L (ref 3.5–5.2)
Sodium: 136 mmol/L (ref 134–144)
eGFR: 24 mL/min/{1.73_m2} — ABNORMAL LOW (ref >59)

## 2021-03-22 ENCOUNTER — Other Ambulatory Visit: Payer: Self-pay | Admitting: Internal Medicine

## 2021-03-22 DIAGNOSIS — I471 Supraventricular tachycardia: Secondary | ICD-10-CM

## 2021-03-26 ENCOUNTER — Inpatient Hospital Stay: Payer: Medicare HMO | Attending: Oncology

## 2021-03-26 DIAGNOSIS — N189 Chronic kidney disease, unspecified: Secondary | ICD-10-CM

## 2021-03-26 DIAGNOSIS — D509 Iron deficiency anemia, unspecified: Secondary | ICD-10-CM | POA: Diagnosis not present

## 2021-03-26 DIAGNOSIS — D631 Anemia in chronic kidney disease: Secondary | ICD-10-CM | POA: Insufficient documentation

## 2021-03-26 LAB — CBC WITH DIFFERENTIAL/PLATELET
Abs Immature Granulocytes: 0.02 10*3/uL (ref 0.00–0.07)
Basophils Absolute: 0 10*3/uL (ref 0.0–0.1)
Basophils Relative: 0 %
Eosinophils Absolute: 0.2 10*3/uL (ref 0.0–0.5)
Eosinophils Relative: 4 %
HCT: 28.2 % — ABNORMAL LOW (ref 39.0–52.0)
Hemoglobin: 9.5 g/dL — ABNORMAL LOW (ref 13.0–17.0)
Immature Granulocytes: 0 %
Lymphocytes Relative: 16 %
Lymphs Abs: 0.8 10*3/uL (ref 0.7–4.0)
MCH: 32.5 pg (ref 26.0–34.0)
MCHC: 33.7 g/dL (ref 30.0–36.0)
MCV: 96.6 fL (ref 80.0–100.0)
Monocytes Absolute: 0.5 10*3/uL (ref 0.1–1.0)
Monocytes Relative: 10 %
Neutro Abs: 3.2 10*3/uL (ref 1.7–7.7)
Neutrophils Relative %: 70 %
Platelets: 109 10*3/uL — ABNORMAL LOW (ref 150–400)
RBC: 2.92 MIL/uL — ABNORMAL LOW (ref 4.22–5.81)
RDW: 14.2 % (ref 11.5–15.5)
WBC: 4.7 10*3/uL (ref 4.0–10.5)
nRBC: 0 % (ref 0.0–0.2)

## 2021-03-26 LAB — FERRITIN: Ferritin: 219 ng/mL (ref 24–336)

## 2021-03-26 LAB — IRON AND TIBC
Iron: 67 ug/dL (ref 45–182)
Saturation Ratios: 26 % (ref 17.9–39.5)
TIBC: 263 ug/dL (ref 250–450)
UIBC: 196 ug/dL

## 2021-04-12 NOTE — Progress Notes (Signed)
Cardiology Office Note:    Date:  04/13/2021   ID:  Rodney Wiley, DOB [birth year], MRN [id number]  PCP:  Remi Haggard, FNP  [facility] Cardiologist:  Nelva Bush, MD  [place] Electrophysiologist:  None   Referring MD: Remi Haggard, FNP   Chief Complaint: 3 week follow-up  History of Present Illness:    Rodney Wiley is a 85 y.o. male with a hx of HTN, CAD s/p CABG 02/2018, aortic regurgitation and thoracic aortic aneurysm s/p Bentall with bioprosthetic aortic valve replacement 02/2018, SVT, HFpEF, CKD stage 3 with possible renal artery stenosis who presents for follow-up.   Patient was seen early this year and EKG noted to have frequent PACs and Dr. Saunders Revel started amiodarone. Echo was ordered.   He was seen in an urgent care and sent to th ED for UTI, incomplete bladder emptying, urinary retention, and worsening renal function. He was sent home with antibiotics and PRN pyridium.   Echo 03/17/21 showed LVEF 60-65%, no WMA, mild LVH, RVSF, normal PASP, mild to mod MR, bioprosthetic AV functioning normally with mean gradient 20.71mmHg, RA pressures 69mmHg.   He was last seen 03/20/21 for amiodarone follow-up. EKG showed SB with first degree AV block. amio was reduced to 100mg  BID.   Today, EKGS shows SB, 48bpm with first degree AV block PRI 362ms, LAFB. He has been taking amiodarone 100mg  daily since the last visit. He denies dizziness of lightheadedness. No chest pain, SOB, LLE, palpitations, PND, orthopnea. No other changes since the last visit.    Past Medical History:  Diagnosis Date  . Anemia   . Aortic insufficiency    a. 02/2018 s/p bioprosthetic AVR; 04/2018 Echo: EF 50-55%, Gr2 DD, Ao bioprosthesis, mean grad 30mmHg, Ao root/Asc Ao nl in size, mild MR, mildly dil LA, nl RV fxn. Nl PASP.  [name] Kitchen Arthropathy   . Ascending aortic aneurysm ([place])    a. 12/2016 MRA: 4.3cm @ sinus of valsalva, 4.9cm above sinotubular jxn; b. 02/2018 s/p biological Bentall and AVR.  [name] Kitchen BPH  (benign prostatic hyperplasia)   . 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 02/2018 Cath: LAD 20ost, 40p, 28m, LCX 60p, OM2 60, OM3 85, RCA 10p/m w/ L->R and R->R collats;  b. 02/2018 CABG x 3: LIMA to LAD, SVG to OM2, SVG to PDA, EVH via right thigh.  . CKD (chronic kidney disease), stage III ([place])   . Diverticulitis   . Essential hypertension   . GERD (gastroesophageal reflux disease)   . Hemorrhoids   . History of chicken pox   . History of Helicobacter pylori infection 06/2007  . Hyperlipidemia   . Hypertension   . Left renal artery stenosis (H[facility])    a. 01/2017 RA u/s: moderate L RAS.  [name] Kitchen Lower extremity edema   . Nonrheumatic mitral (valve) insufficiency    a. 12/2016 Echo: mild to mod MR; b. 09/2017 TEE: mild to mod MR; c. 04/2018 Echo: Mild MR.  [name] Kitchen Nonrheumatic pulmonary valve insufficiency   . 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 s/p 21 mm Edwards Inspiris Resilia stented bovine pericardial tissue valve and 24 mm Gelweave Valsalva synthetic root conduit with reimplantation of left main coronary artery.    Past Surgical History:  Procedure Laterality Date  . AORTIC VALVE REPAIR N/A 02/22/2018   Procedure: AORTIC VALVE REPLACEMENT -Biological Bentall aortic root replacement,;  Surgeon: Rexene Alberts, MD;  Location: [place];  Service: Open Heart Surgery;  Laterality: N/A;  . bypass surgery    . CARDIAC CATHETERIZATION     01/16/2018  . COLONOSCOPY  2011   by Dr. Candace Cruise with findings of diverticulosis  . CORONARY ARTERY BYPASS GRAFT N/A 02/22/2018   Procedure: CORONARY ARTERY BYPASS GRAFTING (CABG) x three, using left internal mammary artery and right leg greater saphenous vein harvested endoscopically;  Surgeon: Rexene Alberts, MD;  Location: [place];  Service: Open Heart Surgery;  Laterality: N/A;  . CORONARY/GRAFT ANGIOGRAPHY N/A 01/16/2018   Procedure: CORONARY/GRAFT ANGIOGRAPHY;  Surgeon: Nelva Bush, MD;   Location: [place] [facility];  Service: Cardiovascular;  Laterality: N/A;  . ELBOW SURGERY Left   . hemorhoidectomy    . RIGHT HEART CATH N/A 01/16/2018   Procedure: RIGHT HEART CATH;  Surgeon: Nelva Bush, MD;  Location: [place] [facility];  Service: Cardiovascular;  Laterality: N/A;  . SHOULDER ARTHROSCOPY WITH ROTATOR CUFF REPAIR Right   . SHOULDER SURGERY Right   . TEE WITHOUT CARDIOVERSION N/A 09/27/2017   Procedure: TRANSESOPHAGEAL ECHOCARDIOGRAM (TEE);  Surgeon: Dorothy Spark, MD;  [facility] END[facility];  Service: Cardiovascular;  Laterality: N/A;  . TEE WITHOUT CARDIOVERSION N/A 02/22/2018   Procedure: TRANSESOPHAGEAL ECHOCARDIOGRAM (TEE);  Surgeon: Rexene Alberts, MD;  Location: [place];  Service: Open Heart Surgery;  Laterality: N/A;  . THORACIC AORTIC ANEURYSM REPAIR N/A 02/22/2018   Procedure: THORACIC ASCENDING ANEURYSM REPAIR (AAA) Resection of ascending aorta aneurysm;  Surgeon: Rexene Alberts, MD;  Location: [facility] OR;  Service: Open Heart Surgery;  Laterality: N/A;    Current Medications: Current Meds  Medication Sig  . amiodarone (PACERONE) 100 MG tablet Take 1 tablet (100 mg total) by mouth daily.  [name] Kitchen amLODipine (NORVASC) 10 MG tablet TAKE ONE TABLET BY MOUTH DAILY  . ascorbic acid (VITAMIN C) 500 MG tablet Take 500 mg by mouth daily.  [name] Kitchen aspirin EC 81 MG tablet Take 81 mg by mouth daily.  [name] Kitchen atorvastatin (LIPITOR) 20 MG tablet TAKE ONE TABLET BY MOUTH DAILY  . carvedilol (COREG) 3.125 MG tablet Take 1 tablet (3.125 mg total) by mouth 2 (two) times daily.  . cholecalciferol (VITAMIN D3) 25 MCG (1000 UT) tablet Take 1,000 Units by mouth daily.  [name] Kitchen doxazosin (CARDURA) 4 MG tablet TAKE ONE TABLET BY MOUTH DAILY  . FARXIGA 10 MG TABS tablet Take 10 mg by mouth daily.  . ferrous sulfate 325 (65 FE) MG EC tablet Take 325 mg by mouth daily with breakfast.   . hydrALAZINE (APRESOLINE) 100 MG tablet TAKE 1 TABLET BY MOUTH 3 TIMES DAILY.  . isosorbide mononitrate (IMDUR) 30 MG  24 hr tablet Take 1 tablet (30 mg total) by mouth daily.  . nitroGLYCERIN (NITROSTAT) 0.4 MG SL tablet Place 1 tablet (0.4 mg total) under the tongue every 5 (five) minutes as needed for chest pain. Maximum of 3 doses.  [name] Kitchen omeprazole (PRILOSEC) 20 MG capsule Take 20 mg by mouth daily.  . phenazopyridine (PYRIDIUM) 95 MG tablet Take 1 tablet (95 mg total) by mouth 3 (three) times daily as needed for pain.  [name] Kitchen torsemide (DEMADEX) 10 MG tablet Take 10 mg by mouth daily as needed.  . [DISCONTINUED] carvedilol (COREG) 12.5 MG tablet Take 1 tablet (12.5 mg total) by mouth 2 (two) times daily.     Allergies:   Patient has no known allergies.   Social History   Socioeconomic History  . Marital status: Widowed    Spouse name: Not on file  .  Number of children: Not on file  . Years of education: Not on file  . Highest education level: Not on file  Occupational History  . Not on file  Tobacco Use  . Smoking status: Never Smoker  . Smokeless tobacco: Never Used  Vaping Use  . Vaping Use: Never used  Substance and Sexual Activity  . Alcohol use: No  . Drug use: No  . Sexual activity: Not Currently  Other Topics Concern  . Not on file  Social History Narrative  . Not on file   Social Determinants of Health   Financial Resource Strain: Not on file  Food Insecurity: Not on file  Transportation Needs: Not on file  Physical Activity: Not on file  Stress: Not on file  Social Connections: Not on file     Family History: The patient's family history includes Colon cancer in his brother; Heart Problems (age of onset: 68) in his son and son; Heart attack in his paternal grandmother; Hypertension in his mother; Kidney failure in his brother; Leukemia in his father; Prostate cancer in his brother; Stroke in his mother, paternal grandfather, and sister; Uterine cancer in his sister.  ROS:   Please see the history of present illness.     All other systems reviewed and are  negative.  EKGs/Labs/Other Studies Reviewed:    The following studies were reviewed today:  Echo 03/17/21 1. Left ventricular ejection fraction, by estimation, is 60 to 65%. Left  ventricular ejection fraction by 3D volume is 69 %. The left ventricle has  normal function. The left ventricle has no regional wall motion  abnormalities. There is mild left  ventricular hypertrophy. Left ventricular diastolic parameters are  indeterminate.  2. Right ventricular systolic function is low normal. The right  ventricular size is normal. There is normal pulmonary artery systolic  pressure.  3. The mitral valve is normal in structure. Mild to moderate mitral valve  regurgitation.  4. The aortic valve has been repaired/replaced. Aortic valve  regurgitation is not visualized. There is a Edwards bioprosthetic valve  present in the aortic position. Echo findings are consistent with normal  structure and function of the aortic valve  prosthesis. Aortic valve mean gradient measures 20.5 mmHg.  5. The inferior vena cava is normal in size with greater than 50%  respiratory variability, suggesting right atrial pressure of 3 mmHg.    Heart monitor 01/22/19   The patient was monitored for 2 days, 22 hours.  The predominant rhythm was sinus with an average rate of 61 bpm (range 44-114 bpm in sinus). First degree AV block was noted.  There were rare PAC's and frequent PVC's, including periods of ventricular bigeminy.  19 atrial runs were observed, lasting up to 11 beats with a maximum rate of 164 bpm.  No sustained arrhythmia or prolonged pause was observed.   Predominantly sinus rhythm with rare PAC's and frequent PVC's.  Several brief atrial runs were noted.   Cardiac cath 2019 Conclusions: 1. Significant 3-vessel coronary artery disease, as detailed below. 2. Mildly elevated left and right heart filling pressures. 3. Upper normal pulmonary artery pressure. 4. Normal Fick cardiac  output/index. 5. Renal angiography not performed due to radiation/contrast needed for coronary angiography.  Recommendations: 1. Proceed with aorta/aortic valve intervention, per Dr. Roxy Manns. CABG at the time of surgery will need to be considered, given multivessel CAD. 2. Aggressive secondary prevention. 3. We will discuss further non-invasive follow-up of renal artery stenosis when Mr. Botts returns for follow-up.  Nelva Bush, MD [facility] Pager: [phone]  Coronary Diagrams   Diagnostic Dominance: Right        EKG:  EKG is  ordered today.  The ekg ordered today demonstrates SB  Recent Labs: 02/27/2021: Magnesium 2.1; TSH 3.473 03/04/2021: ALT 21 03/20/2021: BUN 36; Creatinine, Ser 2.63; Potassium 4.2; Sodium 136 03/26/2021: Hemoglobin 9.5; Platelets 109  Recent Lipid Panel    Component Value Date/Time   CHOL 132 09/15/2018 1115   TRIG 214 (H) 09/15/2018 1115   HDL 33 (L) 09/15/2018 1115   CHOLHDL 4.0 09/15/2018 1115   CHOLHDL 3.8 03/11/2017 0753   VLDL 39 03/11/2017 0753   LDLCALC 56 09/15/2018 1115    Physical Exam:    VS:  BP 134/62 (BP Location: Left Arm, Patient Position: Sitting, Cuff Size: Large)   Pulse (!) 47   Ht 5\' 7"  (1.702 m)   Wt 170 lb (77.1 kg)   SpO2 98%   BMI 26.63 kg/m     Wt Readings from Last 3 Encounters:  04/13/21 170 lb (77.1 kg)  03/20/21 179 lb (81.2 kg)  03/19/21 172 lb (78 kg)     GEN:  Well nourished, well developed in no acute distress HEENT: Normal NECK: No JVD; No carotid bruits LYMPHATICS: No lymphadenopathy CARDIAC: bradycardic, RR, no murmurs, rubs, gallops RESPIRATORY:  Clear to auscultation without rales, wheezing or rhonchi  ABDOMEN: Soft, non-tender, non-distended MUSCULOSKELETAL:  No edema; No deformity  SKIN: Warm and dry NEUROLOGIC:  Alert and oriented x 3 PSYCHIATRIC:  Normal affect   ASSESSMENT:    1. PAC (premature atrial contraction)   2. 1st degree AV block   3. S/P AVR   4.  Chronic kidney disease, stage IV (severe) ([place])   5. Coronary artery disease involving native coronary artery of native heart without angina pectoris   6. Chronic heart failure with preserved ejection fraction (HFpEF) ([place])   7. Coronary artery disease involving native coronary artery of native heart with angina pectoris ([place])   8. PSVT (paroxysmal supraventricular tachycardia) ([place])   9. Essential hypertension    PLAN:    In order of problems listed above:  PSVT/PAC/PVC/LAFB Started on amiodarone in March 2022 by Dr. Saunders Revel for PSVT/PACs. He is asymptomatic. Seen in follow-up and amiodarone decreased to 100mg  daily for 1st degree AV block and bradycardia. EKG today show heart rate 47bpm and PRI 328ms. He continues to be asymptomatic. Decrease Coreg to 3.125mg  BID for bradycardia and 1st degree AV block. Continue amiodarone. Follow-up in 1 week for EKG.  CAD s/p CABG No anginal symptoms. Continue Aspirin, statin, BB, Imdur  Chronic HFpEF/AI s/p AVR Patient has trace pedal edema, which he says is stable. Echo from 2020 showed noraml LVEF and normal functioning bioprosthetic valve. He takes torsemide as needed. Continue GDMT  CKD IV Recent labs showed stable kidney function.  HTN Decrease Coreg as above. Continue amlodipine 10mg  daily, Imdur 30mg  daily, hydral 100mg  TID, Torsemide PRN. BP might increase with reduction in BB. Can increase Imdur. No ACE/ARB for CKD  Disposition: Follow up in 1 week(s) with APP for EKG    Signed, Carrine Kroboth Ninfa Meeker, PA-C  04/13/2021 4:21 PM    [facility]

## 2021-04-13 ENCOUNTER — Other Ambulatory Visit: Payer: Self-pay

## 2021-04-13 ENCOUNTER — Ambulatory Visit: Payer: Medicare HMO | Admitting: Medical

## 2021-04-13 ENCOUNTER — Encounter: Payer: Self-pay | Admitting: Medical

## 2021-04-13 VITALS — BP 134/62 | HR 47 | Ht 67.0 in | Wt 170.0 lb

## 2021-04-13 DIAGNOSIS — I2510 Atherosclerotic heart disease of native coronary artery without angina pectoris: Secondary | ICD-10-CM

## 2021-04-13 DIAGNOSIS — I5032 Chronic diastolic (congestive) heart failure: Secondary | ICD-10-CM

## 2021-04-13 DIAGNOSIS — I471 Supraventricular tachycardia: Secondary | ICD-10-CM

## 2021-04-13 DIAGNOSIS — I440 Atrioventricular block, first degree: Secondary | ICD-10-CM

## 2021-04-13 DIAGNOSIS — Z952 Presence of prosthetic heart valve: Secondary | ICD-10-CM

## 2021-04-13 DIAGNOSIS — I10 Essential (primary) hypertension: Secondary | ICD-10-CM

## 2021-04-13 DIAGNOSIS — I25119 Atherosclerotic heart disease of native coronary artery with unspecified angina pectoris: Secondary | ICD-10-CM

## 2021-04-13 DIAGNOSIS — N184 Chronic kidney disease, stage 4 (severe): Secondary | ICD-10-CM

## 2021-04-13 DIAGNOSIS — I491 Atrial premature depolarization: Secondary | ICD-10-CM

## 2021-04-13 MED ORDER — CARVEDILOL 3.125 MG PO TABS: 3.1250 mg | ORAL_TABLET | Freq: Two times a day (BID) | ORAL | 3 refills | Status: DC

## 2021-04-13 NOTE — Patient Instructions (Addendum)
Medication Instructions:  Your physician has recommended you make the following change in your medication:   1. DECREASE Carvedilol to 3.125 mg twice a day  *If you need a refill on your cardiac medications before your next appointment, please call your pharmacy*   Lab Work: None  If you have labs (blood work) drawn today and your tests are completely normal, you will receive your results only by: [name] Kitchen MyChart Message (if you have MyChart) OR . A paper copy in the mail If you have any lab test that is abnormal or we need to change your treatment, we will call you to review the results.   Testing/Procedures: None   Follow-Up: At [facility], you and your health needs are our priority.  As part of our continuing mission to provide you with exceptional heart care, we have created designated Provider Care Teams.  These Care Teams include your primary Cardiologist (physician) and Advanced Practice Providers (APPs -  Physician Assistants and Nurse Practitioners) who all work together to provide you with the care you need, when you need it.  Your next appointment:   1 week(s)  The format for your next appointment:   In Person  Provider:   You may see Nelva Bush, MD or one of the following Advanced Practice Providers on your designated Care Team:    Murray Hodgkins, NP  Christell Faith, PA-C  Marrianne Mood, PA-C  Cadence [place], [place]  Laurann Montana, NP

## 2021-04-21 ENCOUNTER — Other Ambulatory Visit: Payer: Self-pay

## 2021-04-21 ENCOUNTER — Ambulatory Visit: Payer: Medicare HMO | Admitting: Family

## 2021-04-21 ENCOUNTER — Encounter: Payer: Self-pay | Admitting: Family

## 2021-04-21 VITALS — BP 128/60 | HR 54 | Ht 67.0 in | Wt 171.4 lb

## 2021-04-21 DIAGNOSIS — Z952 Presence of prosthetic heart valve: Secondary | ICD-10-CM

## 2021-04-21 DIAGNOSIS — I5032 Chronic diastolic (congestive) heart failure: Secondary | ICD-10-CM | POA: Diagnosis not present

## 2021-04-21 DIAGNOSIS — N184 Chronic kidney disease, stage 4 (severe): Secondary | ICD-10-CM | POA: Diagnosis not present

## 2021-04-21 DIAGNOSIS — I440 Atrioventricular block, first degree: Secondary | ICD-10-CM | POA: Diagnosis not present

## 2021-04-21 DIAGNOSIS — R001 Bradycardia, unspecified: Secondary | ICD-10-CM | POA: Diagnosis not present

## 2021-04-21 DIAGNOSIS — I471 Supraventricular tachycardia: Secondary | ICD-10-CM | POA: Diagnosis not present

## 2021-04-21 NOTE — Progress Notes (Signed)
Office Visit    Patient Name: Rodney Wiley Date of Encounter: 04/21/2021  PCP:  Remi Haggard, [place]  Cardiologist:  Nelva Bush, MD  Advanced Practice Provider:  No care team member to display Electrophysiologist:  None    Chief Complaint    TARENCE Wiley is a 85 y.o. male with a hx of hypertension, coronary artery disease s/p CABG (02/2018), aortic regurgitation and thoracic aortic aneurysm s/p Bentall with bioprosthetic aortic valve replacement (02/2018), SVT, HFpEF, CKD 3 with questionable left renal artery stenosis presents today for follow-up of bradycardia  Past Medical History    Past Medical History:  Diagnosis Date  . Anemia   . Aortic insufficiency    a. 02/2018 s/p bioprosthetic AVR; 04/2018 Echo: EF 50-55%, Gr2 DD, Ao bioprosthesis, mean grad 77mmHg, Ao root/Asc Ao nl in size, mild MR, mildly dil LA, nl RV fxn. Nl PASP.  [name] Kitchen Arthropathy   . Ascending aortic aneurysm ([place])    a. 12/2016 MRA: 4.3cm @ sinus of valsalva, 4.9cm above sinotubular jxn; b. 02/2018 s/p biological Bentall and AVR.  [name] Kitchen BPH (benign prostatic hyperplasia)   . 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 02/2018 Cath: LAD 20ost, 40p, 97m, LCX 60p, OM2 60, OM3 85, RCA 10p/m w/ L->R and R->R collats;  b. 02/2018 CABG x 3: LIMA to LAD, SVG to OM2, SVG to PDA, EVH via right thigh.  . CKD (chronic kidney disease), stage III ([place])   . Diverticulitis   . Essential hypertension   . GERD (gastroesophageal reflux disease)   . Hemorrhoids   . History of chicken pox   . History of Helicobacter pylori infection 06/2007  . Hyperlipidemia   . Hypertension   . Left renal artery stenosis ([facility])    a. 01/2017 RA u/s: moderate L RAS.  [name] Kitchen Lower extremity edema   . Nonrheumatic mitral (valve) insufficiency    a. 12/2016 Echo: mild to mod MR; b. 09/2017 TEE: mild to mod MR; c. 04/2018 Echo: Mild MR.  [name] Kitchen Nonrheumatic pulmonary valve insufficiency   . 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 s/p 21 mm Edwards Inspiris Resilia stented bovine pericardial tissue valve and 24 mm Gelweave Valsalva synthetic root conduit with reimplantation of left main coronary artery.   Past Surgical History:  Procedure Laterality Date  . AORTIC VALVE REPAIR N/A 02/22/2018   Procedure: AORTIC VALVE REPLACEMENT -Biological Bentall aortic root replacement,;  Surgeon: Rexene Alberts, MD;  Location: [facility];  Service: Open Heart Surgery;  Laterality: N/A;  . bypass surgery    . CARDIAC CATHETERIZATION     01/16/2018  . COLONOSCOPY  2011   by Dr. Candace Cruise with findings of diverticulosis  . CORONARY ARTERY BYPASS GRAFT N/A 02/22/2018   Procedure: CORONARY ARTERY BYPASS GRAFTING (CABG) x three, using left internal mammary artery and right leg greater saphenous vein harvested endoscopically;  Surgeon: Rexene Alberts, MD;  Location: [place];  Service: Open Heart Surgery;  Laterality: N/A;  . CORONARY/GRAFT ANGIOGRAPHY N/A 01/16/2018   Procedure: CORONARY/GRAFT ANGIOGRAPHY;  Surgeon: Nelva Bush, MD;  Location:  [facility];  Service: Cardiovascular;  Laterality: N/A;  . ELBOW SURGERY Left   . hemorhoidectomy    . RIGHT HEART CATH N/A 01/16/2018   Procedure: RIGHT HEART CATH;  Surgeon: Nelva Bush, MD;  Location: [place] [facility];  Service: Cardiovascular;  Laterality: N/A;  .  SHOULDER ARTHROSCOPY WITH ROTATOR CUFF REPAIR Right   . SHOULDER SURGERY Right   . TEE WITHOUT CARDIOVERSION N/A 09/27/2017   Procedure: TRANSESOPHAGEAL ECHOCARDIOGRAM (TEE);  Surgeon: Dorothy Spark, MD;  Location: [facility];  Service: Cardiovascular;  Laterality: N/A;  . TEE WITHOUT CARDIOVERSION N/A 02/22/2018   Procedure: TRANSESOPHAGEAL ECHOCARDIOGRAM (TEE);  Surgeon: [place] Alberts, MD;  Location: [place];  Service: Open Heart Surgery;  Laterality: N/A;  . THORACIC AORTIC ANEURYSM REPAIR N/A 02/22/2018   Procedure: THORACIC ASCENDING  ANEURYSM REPAIR (AAA) Resection of ascending aorta aneurysm;  Surgeon: [place]ene Alberts, MD;  Location: [facility] OR;  Service: Open Heart Surgery;  Laterality: N/A;    Allergies  No Known Allergies  History of Present Illness    Rodney Wiley is a 85 y.o. 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  When last seen his EKG showed sinus rhythm first-degree AV block and frequent PACs. He was started on Amiodarone 200mg  twice daily. Lab work performed 02/28/2019 2K3.4, creatinine 2.46, GFR 26, Hb 11.3, normal thyroid and magnesium.  Was recommended to hold torsemide for 1 day and increase fluid intake. Echocardiogram was ordered.  He was seen in the urgent care and subsequently sent to the ED for UTI, incomplete bladder emptying, urinary retention, worsening renal function.  Lab work 03/04/2021  K3.4, creatinine 9, GFR 21, Hb 10.1.  He with sent home with cefdinir 300 mg twice daily for 5 days as well as PRN Pyridium.   Echocardiogram 03/17/21 showed LVEF 60-65% (by 3D volume 69%), no RWMA, mild LVH, RVSF low normal, normal PASP, mild to moderate MR, bioprosthetic AV functioning appropriately with mean gradient 20.42mmHg, RA pressure 70mmHg.   Seen in clinic 03/20/21 and tolerating Amiodarone. Due to bradycardia dose was reduced to 100mg  twice daily. Seen in follow up 04/13/21 with EKG showing SB 48bpm with first degree AV block 385ms and LAFB. He was taking Amiodarone 100mg  QD. He was asymptomatic in regards to bradycardia. His Coreg was decreased to 3.125mg  BID.   He presents today for follow-up.  He tells me his energy level has improved since last seen and since his dose of carvedilol was reduced.  He notes his energy level is not back to baseline but he is hopeful and will continue to  improve.  He was able to to push mow a portion of his yard yesterday. Reports no shortness of breath nor dyspnea on exertion. Reports no chest pain, pressure, or tightness. No edema, orthopnea, PND. Reports no palpitations.   EKGs/Labs/Other Studies Reviewed:   The following studies were reviewed today:  Cath/PCI:  Coronary angiography/RHC (01/16/2018): LMCA normal.  LAD with sequential 20% ostial, 40% proximal, and long 70% proximal to mid stenoses.  LCx with 60% proximal stenosis and 60 to 80-90% OM2 and OM3 lesions.  Chronic total occlusion of the proximal/mid RCA with left-to-right collaterals.  RA 9, RV 43/10, PA 37/15 (22), and PCWP 18.  Fick CO/CI 5.1/2.6.   CV Surgery:  CABG and Bentall with bioprosthetic AVR (02/22/2018, Dr. Roxy Manns): LIMA to LAD, SVG to OM2, and SVG to PDA.  24 mm synthetic aortic root graft and 21 mm Edwards Inspiris [place] stented bovine pericardial tissue valve.   EP Procedures and Devices:  Event monitor (01/22/19): Patient was monitored for 2 days, 22 hours.  Predominantly  sinus rhythm with rare PAC's and frequent PVC's.  Several brief atrial runs were noted.  PVC burden 8.4%.   Non-Invasive Evaluation(s):  TTE (03/17/21): LVEF 60-65% (EF by 3D volume 69%). No RWMA. Mild LVH, indeterminate diastolic parameters. RVSF low normal. Normal PASP. Mild to moderate MR. Normal structure and function of AV prosthesis (mean gradient 20.5 mmHg).  Renal artery Doppler (02/06/19): No evidence of significant renal artery stenosis affecting either kidney.  TTE (04/07/18): Normal LV size with mild LVH.  LVEF 50 to 55% with anteroseptal and septal hypokinesis.  Grade 2 diastolic dysfunction.  Bioprosthetic aortic valve present with mean gradient of 17 mmHg.  Mild mitral regurgitation.  Normal RV size and function.  No pericardial effusion.  TEE (09/27/17): Mildly dilated LV with LVEF 60-65%. Moderate aortic regurgitation. Mild to moderate mitral regurgitation. Mild right atrial  enlargement. Moderate aortic dilation, with root measuring 5.0 cm and ascending aorta 4.7 cm  MRA chest (06/06/17): Stable ascending aortic aneurysm, measuring 5.0 cm at the sinotubular junction and 4.8 cm at the level of the main pulmonary artery.  Renal artery Doppler (01/13/17): Aortic atherosclerosis without stenosis or aneurysm. Normal right renal artery. Mild to moderate left renal artery stenosis (1-59%). Normal/symmetric kidney size.  MRA chest (12/27/16): Aneurysmal disease of the ascending aorta, measuring 4.3 cm at the sinus of Valsalva and 4.9 cm above the sinotubular junction.  TTE (12/14/16): Mildly dilated LV with mild LVH. Normal contraction with LVEF 55-60%. Grade 1 diastolic dysfunction. Mild to moderate aortic regurgitation. Dilated aorta, measuring up to 4.9 cm above the sinotubular junction. Mild to moderate mitral regurgitation. Mild left atrial enlargement. Normal RV size and function.  Transthoracic echo (09/2016, [facility]): Full report not available.  Reportedly showed LVH with LVEF 96% and diastolic dysfunction.  MR and moderate to severe AI present with sclerotic aortic valve.  Carotid Doppler (09/2016): Full report not available.  Reportedly showed 1-39% stenosis bilaterally.  Transthoracic echo (02/18/16, [facility]): Normal LV and RV size and function.  LVEF > 55%.  Moderate AI; mild MR and TR.  EKG:  EKG is  ordered today.  The ekg ordered today demonstrates sinus bradycardia 54 bpm first-degree AV block PR 348 and LAFB.  Stable Q waves in inferior leads.  Isolated single PVC.  No acute ST/T wave changes.  Recent Labs: 02/27/2021: Magnesium 2.1; TSH 3.473 03/04/2021: ALT 21 03/20/2021: BUN 36; Creatinine, Ser 2.63; Potassium 4.2; Sodium 136 03/26/2021: Hemoglobin 9.5; Platelets 109  Recent Lipid Panel    Component Value Date/Time   CHOL 132 09/15/2018 1115   TRIG 214 (H) 09/15/2018 1115   HDL 33 (L) 09/15/2018 1115   CHOLHDL 4.0 09/15/2018 1115    CHOLHDL 3.8 03/11/2017 0753   VLDL 39 03/11/2017 0753   LDLCALC 56 09/15/2018 1115    Home Medications   No outpatient medications have been marked as taking for the 04/21/21 encounter (Office Visit) with Loel Dubonnet, NP.     Review of Systems  All other systems reviewed and are otherwise negative except as noted above.  Physical Exam    VS:  BP 128/60 (BP Location: Left Arm, Patient Position: Sitting, Cuff Size: Normal)   Pulse (!) 54   Ht 5\' 7"  (1.702 m)   Wt 171 lb 6 oz (77.7 kg)   SpO2 98%   BMI 26.84 kg/m  , BMI Body mass index is 26.84 kg/m.  Wt Readings from Last 3 Encounters:  04/21/21 171 lb 6 oz (77.7 kg)  04/13/21  170 lb (77.1 kg)  03/20/21 179 lb (81.2 kg)     GEN: Well nourished, well developed, in no acute distress. HEENT: normal. Neck: Supple, no JVD, carotid bruits, or masses. Cardiac: Bradycardic, RRR, no rubs, or gallops. LO7/5 systolic murmur. No clubbing, cyanosis.  Nonpitting bilateral pedal edema..  Radials/DP/PT 2+ and equal bilaterally.  Respiratory:  Respirations regular and unlabored, clear to auscultation bilaterally. GI: Soft, nontender, nondistended. MS: No deformity or atrophy. Skin: Warm and dry, no rash. Neuro:  Strength and sensation are intact. Psych: Normal affect.  Assessment & Plan    1. PSVT / PAC/PVC / LAFB / Bradycardia  -  EKG today shows sinus bradycardia 54 bpm with first-degree AV block and LAFB with isolated single PVC.  Reports energy level improving since reduced dose of carvedilol.  Continue Coreg 3.125 mg twice daily and amiodarone 100 mg daily.  He reports his energy level is gradually improving we will continue to monitor closely.  If he is recurrent fatigue may need to consider discontinuing either amiodarone or carvedilol and/or referral to EP though concern for recurrent arrhythmia if BB/Amiodarone discontinued.  He will contact our office if heart rate is consistently below 50 bpm.  2. CAD s/p CABG-EKG today  with stable inferior Q waves and no acute ST/T changes.  No anginal symptoms.  No indication for ischemic evaluation at this time.  GDMT includes beta-blocker, statin, aspirin, Imdur  3. Chronic HFpEF / AI s/p AVR - Stable NYHA II symptoms.  Echocardiogram 03/2019 with normal LVEF and bioprosthetic aortic valve functioning appropriately.  Continue current medications and current diuretic regimen.  Heart healthy diet and regular cardiovascular exercise encouraged.   4. CKD IV - Careful titration of diuretics and antihypertensives.    Disposition: Follow up in 6 week(s) with Dr. Saunders Revel or APP   Signed, Loel Dubonnet, NP 04/21/2021, 8:57 AM [place]

## 2021-04-21 NOTE — Patient Instructions (Addendum)
Medication Instructions:  Continue your current medications.   *If you need a refill on your cardiac medications before your next appointment, please call your pharmacy*  Lab Work: None ordered today.   Testing/Procedures: Your EKG today showed sinus bradycardia which is a stable finding - your heart rate was 54 bpm which is improved compared to previous.   Follow-Up: At [facility], you and your health needs are our priority.  As part of our continuing mission to provide you with exceptional heart care, we have created designated Provider Care Teams.  These Care Teams include your primary Cardiologist (physician) and Advanced Practice Providers (APPs -  Physician Assistants and Nurse Practitioners) who all work together to provide you with the care you need, when you need it.   Your next appointment:   6 week(s)  The format for your next appointment:   In Person  Provider:   You may see Nelva Bush, MD or one of the following Advanced Practice Providers on your designated Care Team:    Murray Hodgkins, NP  Christell Faith, PA-C  Marrianne Mood, PA-C  Cadence Kathlen Mody, [place]  Other Instructions  Contact our office if your heart rate is consistently less than 50 beats per minute. Please check your heart rate two times per week and keep a log.   Tips to Measure your Blood Pressure Correctly  Here's what you can do to ensure a correct reading: . Don't drink a caffeinated beverage or smoke during the 30 minutes before the test. . Sit quietly for five minutes before the test begins. . During the measurement, sit in a chair with your feet on the floor and your arm supported so your elbow is at about heart level. . The inflatable part of the cuff should completely cover at least 80% of your upper arm, and the cuff should be placed on bare skin, not over a shirt. . Don't talk during the measurement. . Have your blood pressure measured twice, with a brief break in between. If the  readings are different by 5 points or more, have it done a third time.   Blood pressure categories  Blood pressure category SYSTOLIC (upper number)  DIASTOLIC (lower number)  Normal Less than 120 mm Hg and Less than 80 mm Hg  Elevated 120-129 mm Hg and Less than 80 mm Hg  High blood pressure: Stage 1 hypertension 130-139 mm Hg or 80-89 mm Hg  High blood pressure: Stage 2 hypertension 140 mm Hg or higher or 90 mm Hg or higher  Hypertensive crisis (consult your doctor immediately) Higher than 180 mm Hg and/or Higher than 120 mm Hg  Source: American Heart Association and American Stroke Association. For more on getting your blood pressure under control, buy Controlling Your Blood Pressure, a Special Health Report from [facility].  Blood Pressure Log   Date   Heart Rate  Blood Pressure  Example: Nov 1 55bpm 124/78                                            Heart Healthy Diet Recommendations: A low-salt diet is recommended. Meats should be grilled, baked, or boiled. Avoid fried foods. Focus on lean protein sources like fish or chicken with vegetables and fruits. The American Heart Association is a Microbiologist!  American Heart Association Diet and Lifeystyle Recommendations   Exercise recommendations: The American Heart  Association recommends 150 minutes of moderate intensity exercise weekly. Try 30 minutes of moderate intensity exercise 4-5 times per week. This could include walking, jogging, or swimming.

## 2021-04-28 DIAGNOSIS — Z8249 Family history of ischemic heart disease and other diseases of the circulatory system: Secondary | ICD-10-CM | POA: Diagnosis not present

## 2021-04-28 DIAGNOSIS — D696 Thrombocytopenia, unspecified: Secondary | ICD-10-CM | POA: Diagnosis not present

## 2021-04-28 DIAGNOSIS — I350 Nonrheumatic aortic (valve) stenosis: Secondary | ICD-10-CM | POA: Diagnosis not present

## 2021-04-28 DIAGNOSIS — I5032 Chronic diastolic (congestive) heart failure: Secondary | ICD-10-CM | POA: Diagnosis not present

## 2021-04-28 DIAGNOSIS — R5383 Other fatigue: Secondary | ICD-10-CM | POA: Diagnosis not present

## 2021-04-28 DIAGNOSIS — I361 Nonrheumatic tricuspid (valve) insufficiency: Secondary | ICD-10-CM | POA: Diagnosis not present

## 2021-04-28 DIAGNOSIS — R609 Edema, unspecified: Secondary | ICD-10-CM | POA: Diagnosis not present

## 2021-04-28 DIAGNOSIS — E559 Vitamin D deficiency, unspecified: Secondary | ICD-10-CM | POA: Diagnosis not present

## 2021-04-28 DIAGNOSIS — M79661 Pain in right lower leg: Secondary | ICD-10-CM | POA: Diagnosis not present

## 2021-04-28 DIAGNOSIS — I351 Nonrheumatic aortic (valve) insufficiency: Secondary | ICD-10-CM | POA: Diagnosis not present

## 2021-04-28 DIAGNOSIS — R5381 Other malaise: Secondary | ICD-10-CM | POA: Diagnosis not present

## 2021-04-28 DIAGNOSIS — E538 Deficiency of other specified B group vitamins: Secondary | ICD-10-CM | POA: Diagnosis not present

## 2021-04-28 DIAGNOSIS — E7800 Pure hypercholesterolemia, unspecified: Secondary | ICD-10-CM | POA: Diagnosis not present

## 2021-04-28 DIAGNOSIS — D519 Vitamin B12 deficiency anemia, unspecified: Secondary | ICD-10-CM | POA: Diagnosis not present

## 2021-04-28 DIAGNOSIS — G603 Idiopathic progressive neuropathy: Secondary | ICD-10-CM | POA: Diagnosis not present

## 2021-04-28 DIAGNOSIS — D649 Anemia, unspecified: Secondary | ICD-10-CM | POA: Diagnosis not present

## 2021-04-28 DIAGNOSIS — I10 Essential (primary) hypertension: Secondary | ICD-10-CM | POA: Diagnosis not present

## 2021-04-28 DIAGNOSIS — G608 Other hereditary and idiopathic neuropathies: Secondary | ICD-10-CM | POA: Diagnosis not present

## 2021-04-28 DIAGNOSIS — E039 Hypothyroidism, unspecified: Secondary | ICD-10-CM | POA: Diagnosis not present

## 2021-04-30 DIAGNOSIS — H524 Presbyopia: Secondary | ICD-10-CM | POA: Diagnosis not present

## 2021-05-07 DIAGNOSIS — R7309 Other abnormal glucose: Secondary | ICD-10-CM | POA: Diagnosis not present

## 2021-05-11 ENCOUNTER — Other Ambulatory Visit: Payer: Self-pay | Admitting: *Deleted

## 2021-05-11 ENCOUNTER — Other Ambulatory Visit: Payer: Self-pay | Admitting: Internal Medicine

## 2021-05-11 DIAGNOSIS — R972 Elevated prostate specific antigen [PSA]: Secondary | ICD-10-CM

## 2021-05-19 DIAGNOSIS — N184 Chronic kidney disease, stage 4 (severe): Secondary | ICD-10-CM | POA: Diagnosis not present

## 2021-05-19 DIAGNOSIS — D649 Anemia, unspecified: Secondary | ICD-10-CM | POA: Diagnosis not present

## 2021-05-19 DIAGNOSIS — I10 Essential (primary) hypertension: Secondary | ICD-10-CM | POA: Diagnosis not present

## 2021-05-20 ENCOUNTER — Other Ambulatory Visit: Payer: Self-pay

## 2021-05-20 MED ORDER — ATORVASTATIN CALCIUM 20 MG PO TABS: 1.0000 | ORAL_TABLET | Freq: Every day | ORAL | 3 refills | Status: DC

## 2021-05-20 MED ORDER — FARXIGA 10 MG PO TABS: 10.0000 mg | ORAL_TABLET | Freq: Every day | ORAL | 3 refills | Status: DC

## 2021-06-02 ENCOUNTER — Other Ambulatory Visit: Payer: Self-pay | Admitting: Internal Medicine

## 2021-06-03 NOTE — Progress Notes (Signed)
Follow-up Outpatient Visit Date: 06/04/2021  Primary Care Provider: Remi Haggard, [place] [address]  Chief Complaint: Follow-up hypertension and bradycardia  HPI:  [name] is a 85 y.o. male with history of hypertension, coronary artery disease status post CABG (3/19) aortic regurgitation and thoracic aortic aneurysm status post Bentall with bioprosthetic aortic valve replacement (3/19), SVT, HFpEF, and chronic kidney disease stage 3 with questionable left renal artery stenosis, who presents for follow-up of CAD, valvular heart and aortic disease, and hypertension.  He was last seen in our office in mid May by Laurann Montana, NP, for follow-up of bradycardia.  He had previously noted to be bradycardic with heart rates in the 40s and associated first-degree AV block after episodes of PSVT and frequent PACs were noted earlier in the year prompting addition of amiodarone.  Carvedilol was reduced in April with improvement in energy, though was not quite back to baseline.  Today, [name] reports that he is feeling relatively well.  He denies chest pain, shortness of breath, palpitations, and lightheadedness.  His energy is fair.  He has been monitoring his blood pressure and heart rates at home.  Heart rates even after some activity tend to run in the mid to upper 50s.  Blood pressures fluctuate, ranging from 110 to 619 mmHg systolic.  He remains compliant with his medications and has not had any side effects.  His chronic ankle edema is stable.  He uses as needed torsemide 2 to 3 days/week to control this.  --------------------------------------------------------------------------------------------------  Cardiovascular History & Procedures: Cardiovascular Problems: Thoracic aortic aneurysm s/p Bentall Aortic regurgitation s/p bioprosthetic AVR Mitral regurgitation Questionable renal artery stenosis Coronary artery disease s/p CABG Supraventricular  tachycardia Postoperative atrial fibrillation   Risk Factors: Known CAD, hypertension, hyperlipidemia, male gender, and age > 62   Cath/PCI: Coronary angiography/RHC (01/16/2018): LMCA normal.  LAD with sequential 20% ostial, 40% proximal, and long 70% proximal to mid stenoses.  LCx with 60% proximal stenosis and 60 to 80-90% OM2 and OM3 lesions.  Chronic total occlusion of the proximal/mid RCA with left-to-right collaterals.  RA 9, RV 43/10, PA 37/15 (22), and PCWP 18.  Fick CO/CI 5.1/2.6.   CV Surgery: CABG and Bentall with bioprosthetic AVR (02/22/2018, Dr. Roxy Manns): LIMA to LAD, SVG to OM2, and SVG to PDA.  24 mm synthetic aortic root graft and 21 mm Edwards Inspiris [place] stented bovine pericardial tissue valve.   EP Procedures and Devices: Event monitor (01/22/19): Patient was monitored for 2 days, 22 hours.  Predominantly sinus rhythm with rare PAC's and frequent PVC's.  Several brief atrial runs were noted.  PVC burden 8.4%.   Non-Invasive Evaluation(s): Renal artery Doppler (02/06/19): No evidence of significant renal artery stenosis affecting either kidney. TTE (04/07/18): Normal LV size with mild LVH.  LVEF 50 to 55% with anteroseptal and septal hypokinesis.  Grade 2 diastolic dysfunction.  Bioprosthetic aortic valve present with mean gradient of 17 mmHg.  Mild mitral regurgitation.  Normal RV size and function.  No pericardial effusion. TEE (09/27/17): Mildly dilated LV with LVEF 60-65%. Moderate aortic regurgitation. Mild to moderate mitral regurgitation. Mild right atrial enlargement. Moderate aortic dilation, with root measuring 5.0 cm and ascending aorta 4.7 cm MRA chest (06/06/17): Stable ascending aortic aneurysm, measuring 5.0 cm at the sinotubular junction and 4.8 cm at the level of the main pulmonary artery. Renal artery Doppler (01/13/17): Aortic atherosclerosis without stenosis or aneurysm. Normal right renal artery. Mild to moderate left renal artery stenosis (  1-59%). Normal/symmetric  kidney size. MRA chest (12/27/16): Aneurysmal disease of the ascending aorta, measuring 4.3 cm at the sinus of Valsalva and 4.9 cm above the sinotubular junction. TTE (12/14/16): Mildly dilated LV with mild LVH. Normal contraction with LVEF 55-60%. Grade 1 diastolic dysfunction. Mild to moderate aortic regurgitation. Dilated aorta, measuring up to 4.9 cm above the sinotubular junction. Mild to moderate mitral regurgitation. Mild left atrial enlargement. Normal RV size and function. Transthoracic echo (09/2016, [facility]): Full report not available.  Reportedly showed LVH with LVEF 19% and diastolic dysfunction.  MR and moderate to severe AI present with sclerotic aortic valve. Carotid Doppler (10/201&): Full report not available.  Reportedly showed 1-39% stenosis bilaterally. Transthoracic echo (02/18/16, [facility]): Normal LV and RV size and function.  LVEF > 55%.  Moderate AI; mild MR and TR.  Recent CV Pertinent Labs: Lab Results  Component Value Date   CHOL 132 09/15/2018   HDL 33 (L) 09/15/2018   LDLCALC 56 09/15/2018   TRIG 214 (H) 09/15/2018   CHOLHDL 4.0 09/15/2018   CHOLHDL 3.8 03/11/2017   INR 2.9 05/24/2018   INR 7.53 (HH) 03/27/2018   K 4.2 03/20/2021   MG 2.1 02/27/2021   BUN 36 (H) 03/20/2021   CREATININE 2.63 (H) 03/20/2021    Past medical and surgical history were reviewed and updated in EPIC.  Current Meds  Medication Sig   amiodarone (PACERONE) 100 MG tablet Take 1 tablet (100 mg total) by mouth daily.   amLODipine (NORVASC) 10 MG tablet TAKE 1 TABLET BY MOUTH EVERY DAY   ascorbic acid (VITAMIN C) 500 MG tablet Take 500 mg by mouth daily.   aspirin EC 81 MG tablet Take 81 mg by mouth daily.   atorvastatin (LIPITOR) 20 MG tablet Take 1 tablet (20 mg total) by mouth daily.   carvedilol (COREG) 3.125 MG tablet Take 1 tablet (3.125 mg total) by mouth 2 (two) times daily.   cholecalciferol (VITAMIN D3) 25 MCG (1000 UT) tablet Take 1,000 Units by  mouth daily.   doxazosin (CARDURA) 4 MG tablet TAKE 1 TABLET BY MOUTH EVERY DAY   FARXIGA 10 MG TABS tablet Take 1 tablet (10 mg total) by mouth daily.   ferrous sulfate 325 (65 FE) MG EC tablet Take 325 mg by mouth daily with breakfast.    hydrALAZINE (APRESOLINE) 100 MG tablet TAKE 1 TABLET BY MOUTH 3 TIMES DAILY.   isosorbide mononitrate (IMDUR) 30 MG 24 hr tablet TAKE 1 TABLET BY MOUTH EVERY DAY   nitroGLYCERIN (NITROSTAT) 0.4 MG SL tablet Place 1 tablet (0.4 mg total) under the tongue every 5 (five) minutes as needed for chest pain. Maximum of 3 doses.   omeprazole (PRILOSEC) 20 MG capsule Take 20 mg by mouth daily.   phenazopyridine (PYRIDIUM) 95 MG tablet Take 1 tablet (95 mg total) by mouth 3 (three) times daily as needed for pain.   torsemide (DEMADEX) 10 MG tablet Take 10 mg by mouth daily as needed.    Allergies: Patient has no known allergies.  Social History   Tobacco Use   Smoking status: Never   Smokeless tobacco: Never  Vaping Use   Vaping Use: Never used  Substance Use Topics   Alcohol use: No   Drug use: No    Family History  Problem Relation Age of Onset   Hypertension Mother    Stroke Mother    Leukemia Father    Heart attack Paternal Grandmother    Stroke Paternal Grandfather  Heart Problems Son 89   Heart Problems Son 73   Uterine cancer Sister    Prostate cancer Brother    Colon cancer Brother    Kidney failure Brother    Stroke Sister     Review of Systems: A 12-system review of systems was performed and was negative except as noted in the HPI.  --------------------------------------------------------------------------------------------------  Physical Exam: BP 110/60 (BP Location: Left Arm, Patient Position: Sitting, Cuff Size: Normal)   Pulse (!) 50   Ht 5\' 7"  (1.702 m)   Wt 176 lb (79.8 kg)   SpO2 97%   BMI 27.57 kg/m   General:  NAD. Neck: No JVD or HJR. Lungs: Clear to auscultation bilaterally without wheezes or  crackles. Heart: Regular rate and rhythm with 3/6 systolic murmur. Abdomen: Soft, nontender, nondistended. Extremities: 1+ pretibial edema bilaterally.  EKG: Sinus bradycardia (heart rate 50 bpm) with first-degree AV block (PR interval 306 ms), left axis deviation, LVH, and poor R wave progression.  PVCs are no longer present.  Otherwise, no significant change from prior tracing on 04/21/2021.  Lab Results  Component Value Date   WBC 4.7 03/26/2021   HGB 9.5 (L) 03/26/2021   HCT 28.2 (L) 03/26/2021   MCV 96.6 03/26/2021   PLT 109 (L) 03/26/2021    Lab Results  Component Value Date   NA 136 03/20/2021   K 4.2 03/20/2021   CL 106 03/20/2021   CO2 15 (L) 03/20/2021   BUN 36 (H) 03/20/2021   CREATININE 2.63 (H) 03/20/2021   GLUCOSE 95 03/20/2021   ALT 21 03/04/2021    Lab Results  Component Value Date   CHOL 132 09/15/2018   HDL 33 (L) 09/15/2018   LDLCALC 56 09/15/2018   TRIG 214 (H) 09/15/2018   CHOLHDL 4.0 09/15/2018    --------------------------------------------------------------------------------------------------  ASSESSMENT AND PLAN: Bradycardia and PSVT: Patient continues to have sinus bradycardia with significant PR prolongation today despite de-escalation of carvedilol over the past few visits.  He has not had any palpitations nor is there evidence of supraventricular ectopy or PVCs on today's EKG.  I recommended discontinuation of carvedilol altogether, given that blood pressure is low normal today in the setting of sinus bradycardia.  If heart rate remains low low at follow-up and/or Mr. Griffee develops symptoms of bradycardia, we will need to repeat ambulatory cardiac monitoring.  Continue low-dose amiodarone with low threshold for referral to EP.  Chronic HFpEF: Mr. Frampton has stable class I-II symptoms with chronic ankle edema similar to prior visits.  His weight is up 5 pounds from his last visit in May but similar to prior visits in March.  Continue as  needed torsemide.  Labile hypertension: Blood pressure low normal today even on recheck.  He notes some home blood pressures as high as 160/80 in the last few days.  He has had issues with high blood pressure in the past.  However, given his readings today, I have recommended discontinuation of carvedilol with close monitoring of blood pressure at home.  We will defer changes to his other medications today.  Coronary artery disease: No symptoms to suggest worsening coronary insufficiency.  Continue aspirin, isosorbide mononitrate, and atorvastatin for antianginal therapy and secondary prevention.  Thoracic aortic aneurysm: Patient's status post bio Bentall.  Echocardiogram in April with appropriate valve function.  Continue to work on blood pressure control and secondary prevention with lipid therapy.  Chronic kidney disease stage IV: Avoid nephrotoxic drugs.  Ongoing follow-up with [facility] nephrology.  Follow-up: Return  to clinic in 6 weeks to reassess heart rate and blood pressure.  Nelva Bush, MD 06/04/2021 8:35 AM

## 2021-06-04 ENCOUNTER — Encounter: Payer: Self-pay | Admitting: Internal Medicine

## 2021-06-04 ENCOUNTER — Ambulatory Visit: Payer: Medicare HMO | Admitting: Internal Medicine

## 2021-06-04 ENCOUNTER — Other Ambulatory Visit: Payer: Self-pay

## 2021-06-04 VITALS — BP 110/60 | HR 50 | Ht 67.0 in | Wt 176.0 lb

## 2021-06-04 DIAGNOSIS — R0989 Other specified symptoms and signs involving the circulatory and respiratory systems: Secondary | ICD-10-CM

## 2021-06-04 DIAGNOSIS — R001 Bradycardia, unspecified: Secondary | ICD-10-CM | POA: Diagnosis not present

## 2021-06-04 DIAGNOSIS — I2510 Atherosclerotic heart disease of native coronary artery without angina pectoris: Secondary | ICD-10-CM | POA: Diagnosis not present

## 2021-06-04 DIAGNOSIS — I712 Thoracic aortic aneurysm, without rupture, unspecified: Secondary | ICD-10-CM

## 2021-06-04 DIAGNOSIS — I5032 Chronic diastolic (congestive) heart failure: Secondary | ICD-10-CM | POA: Diagnosis not present

## 2021-06-04 DIAGNOSIS — I471 Supraventricular tachycardia: Secondary | ICD-10-CM | POA: Diagnosis not present

## 2021-06-04 NOTE — Patient Instructions (Signed)
Medication Instructions:   Your physician has recommended you make the following change in your medication:   STOP Carvedilol   *If you need a refill on your cardiac medications before your next appointment, please call your pharmacy*   Lab Work:  None ordered  Testing/Procedures:  None ordered   Follow-Up: At [facility], you and your health needs are our priority.  As part of our continuing mission to provide you with exceptional heart care, we have created designated Provider Care Teams.  These Care Teams include your primary Cardiologist (physician) and Advanced Practice Providers (APPs -  Physician Assistants and Nurse Practitioners) who all work together to provide you with the care you need, when you need it.  We recommend signing up for the patient portal called "MyChart".  Sign up information is provided on this After Visit Summary.  MyChart is used to connect with patients for Virtual Visits (Telemedicine).  Patients are able to view lab/test results, encounter notes, upcoming appointments, etc.  Non-urgent messages can be sent to your provider as well.   To learn more about what you can do with MyChart, go to [email or web address].    Your next appointment:   6 week(s)  The format for your next appointment:   In Person  Provider:   You may see Nelva Bush, MD or one of the following Advanced Practice Providers on your designated Care Team:   Murray Hodgkins, NP Christell Faith, PA-C Marrianne Mood, PA-C Cadence Bruni, [place] Laurann Montana, NP

## 2021-06-10 ENCOUNTER — Other Ambulatory Visit: Payer: Self-pay | Admitting: Internal Medicine

## 2021-06-18 ENCOUNTER — Other Ambulatory Visit: Payer: Medicare HMO

## 2021-06-18 ENCOUNTER — Other Ambulatory Visit: Payer: Self-pay

## 2021-06-18 DIAGNOSIS — R972 Elevated prostate specific antigen [PSA]: Secondary | ICD-10-CM | POA: Diagnosis not present

## 2021-06-19 ENCOUNTER — Encounter: Payer: Self-pay | Admitting: *Deleted

## 2021-06-19 LAB — PSA: Prostate Specific Ag, Serum: 12.6 ng/mL — ABNORMAL HIGH (ref 0.0–4.0)

## 2021-06-25 ENCOUNTER — Inpatient Hospital Stay: Payer: Medicare HMO

## 2021-06-25 ENCOUNTER — Inpatient Hospital Stay: Payer: Medicare HMO | Admitting: Oncology

## 2021-07-09 MED ORDER — ISOSORBIDE MONONITRATE ER 30 MG PO TB24: 30.0000 mg | ORAL_TABLET | Freq: Two times a day (BID) | ORAL | Status: DC

## 2021-07-13 ENCOUNTER — Inpatient Hospital Stay (HOSPITAL_BASED_OUTPATIENT_CLINIC_OR_DEPARTMENT_OTHER): Payer: Medicare HMO | Admitting: Nurse Practitioner

## 2021-07-13 ENCOUNTER — Other Ambulatory Visit: Payer: Self-pay

## 2021-07-13 ENCOUNTER — Inpatient Hospital Stay: Payer: Medicare HMO | Attending: Oncology

## 2021-07-13 VITALS — BP 140/67 | HR 56 | Temp 97.7°F | Resp 17 | Wt 177.0 lb

## 2021-07-13 DIAGNOSIS — N189 Chronic kidney disease, unspecified: Secondary | ICD-10-CM

## 2021-07-13 DIAGNOSIS — N183 Chronic kidney disease, stage 3 unspecified: Secondary | ICD-10-CM | POA: Insufficient documentation

## 2021-07-13 DIAGNOSIS — D631 Anemia in chronic kidney disease: Secondary | ICD-10-CM | POA: Insufficient documentation

## 2021-07-13 DIAGNOSIS — D696 Thrombocytopenia, unspecified: Secondary | ICD-10-CM | POA: Insufficient documentation

## 2021-07-13 DIAGNOSIS — Z79899 Other long term (current) drug therapy: Secondary | ICD-10-CM | POA: Diagnosis not present

## 2021-07-13 DIAGNOSIS — D509 Iron deficiency anemia, unspecified: Secondary | ICD-10-CM | POA: Diagnosis not present

## 2021-07-13 LAB — CBC WITH DIFFERENTIAL/PLATELET
Abs Immature Granulocytes: 0.02 10*3/uL (ref 0.00–0.07)
Basophils Absolute: 0 10*3/uL (ref 0.0–0.1)
Basophils Relative: 0 %
Eosinophils Absolute: 0.2 10*3/uL (ref 0.0–0.5)
Eosinophils Relative: 3 %
HCT: 30.7 % — ABNORMAL LOW (ref 39.0–52.0)
Hemoglobin: 10.1 g/dL — ABNORMAL LOW (ref 13.0–17.0)
Immature Granulocytes: 0 %
Lymphocytes Relative: 18 %
Lymphs Abs: 0.9 10*3/uL (ref 0.7–4.0)
MCH: 32.3 pg (ref 26.0–34.0)
MCHC: 32.9 g/dL (ref 30.0–36.0)
MCV: 98.1 fL (ref 80.0–100.0)
Monocytes Absolute: 0.5 10*3/uL (ref 0.1–1.0)
Monocytes Relative: 9 %
Neutro Abs: 3.6 10*3/uL (ref 1.7–7.7)
Neutrophils Relative %: 70 %
Platelets: 124 10*3/uL — ABNORMAL LOW (ref 150–400)
RBC: 3.13 MIL/uL — ABNORMAL LOW (ref 4.22–5.81)
RDW: 13.9 % (ref 11.5–15.5)
WBC: 5.2 10*3/uL (ref 4.0–10.5)
nRBC: 0 % (ref 0.0–0.2)

## 2021-07-13 LAB — IRON AND TIBC
Iron: 76 ug/dL (ref 45–182)
Saturation Ratios: 29 % (ref 17.9–39.5)
TIBC: 266 ug/dL (ref 250–450)
UIBC: 190 ug/dL

## 2021-07-13 LAB — FERRITIN: Ferritin: 106 ng/mL (ref 24–336)

## 2021-07-13 NOTE — Progress Notes (Signed)
Hematology/Oncology Consult note [facility]  Telephone:([phone] [phone]  Patient Care Team: Remi Haggard, FNP as PCP - General (Family Medicine) End, Harrell Gave, MD as PCP - Cardiology (Cardiology)   Name of the patient: Rodney Wiley  [id number]  1936/04/01   Date of visit: 07/13/21  Diagnosis- anemia likely secondary to CKD and iron deficiency, Thrombocytopenia  Chief complaint/ Reason for visit-routine follow-up of anemia  Heme/Onc history:  patient is a 85 year old male with a past medical history significant for hypertension, hyperlipidemia, stage III CKD and history of aortic insufficiency as well as ascending aortic aneurysm.  He recently underwent bioprosthetic aortic valve replacement, CABG x3 and resuspension of the left atrium on 02/22/2018.    Results of blood work in June 2021 were consistent with iron deficiency along with CKD. He received venofer in July 2021.     Interval history- Patient is 85 year old male with anemia of ckd and iron deficiency anemia who presents for follow up. He feels well. Denies specific complaints.   ECOG PS- 1 Pain scale- 0 Opioid associated constipation- no  Review of systems- Review of Systems  Constitutional:  Negative for chills, fever, malaise/fatigue and weight loss.  HENT:  Negative for congestion, ear discharge and nosebleeds.   Eyes:  Negative for blurred vision.  Respiratory:  Negative for cough, hemoptysis, sputum production, shortness of breath and wheezing.   Cardiovascular:  Negative for chest pain, palpitations, orthopnea and claudication.  Gastrointestinal:  Negative for abdominal pain, blood in stool, constipation, diarrhea, heartburn, melena, nausea and vomiting.  Genitourinary:  Negative for dysuria, flank pain, frequency, hematuria and urgency.  Musculoskeletal:  Negative for back pain, joint pain and myalgias.  Skin:  Negative for rash.  Neurological:  Negative for  dizziness, tingling, focal weakness, seizures, weakness and headaches.  Endo/Heme/Allergies:  Does not bruise/bleed easily.  Psychiatric/Behavioral:  Negative for depression and suicidal ideas. The patient does not have insomnia.      No Known Allergies   Past Medical History:  Diagnosis Date   Anemia    Aortic insufficiency    a. 02/2018 s/p bioprosthetic AVR; 04/2018 Echo: EF 50-55%, Gr2 DD, Ao bioprosthesis, mean grad 73mmHg, Ao root/Asc Ao nl in size, mild MR, mildly dil LA, nl RV fxn. Nl PASP.   Arthropathy    Ascending aortic aneurysm ([place])    a. 12/2016 MRA: 4.3cm @ sinus of valsalva, 4.9cm above sinotubular jxn; b. 02/2018 s/p biological Bentall and AVR.   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 02/2018 Cath: LAD 20ost, 40p, 52m, LCX 60p, OM2 60, OM3 85, RCA 10p/m w/ L->R and R->R collats;  b. 02/2018 CABG x 3: LIMA to LAD, SVG to OM2, SVG to PDA, EVH via right thigh.   CKD (chronic kidney disease), stage III (HCC)    Diverticulitis    Essential hypertension    GERD (gastroesophageal reflux disease)    Hemorrhoids    History of chicken pox    History of Helicobacter pylori infection 06/2007   Hyperlipidemia    Hypertension    Left renal artery stenosis (HCC)    a. 01/2017 RA u/s: moderate L RAS.   Lower extremity edema    Nonrheumatic mitral (valve) insufficiency    a. 12/2016 Echo: mild to mod MR; b. 09/2017 TEE: mild to mod MR; c. 04/2018 Echo: Mild MR.   Nonrheumatic pulmonary valve insufficiency    S/P biological Bentall aortic root replacement  with bioprosthetic valve and synthetic root conduit 02/22/2018   a. s/p 21 mm Edwards Inspiris [place] stented bovine pericardial tissue valve and 24 mm Gelweave Valsalva synthetic root conduit with reimplantation of left main coronary artery.     Past Surgical History:  Procedure Laterality Date   AORTIC VALVE REPAIR N/A 02/22/2018   Procedure: AORTIC VALVE REPLACEMENT -Biological Bentall aortic root  replacement,;  Surgeon: Rexene Alberts, MD;  Location: [place];  Service: Open Heart Surgery;  Laterality: N/A;   bypass surgery     CARDIAC CATHETERIZATION     01/16/2018   COLONOSCOPY  2011   by Dr. Candace Cruise with findings of diverticulosis   CORONARY ARTERY BYPASS GRAFT N/A 02/22/2018   Procedure: CORONARY ARTERY BYPASS GRAFTING (CABG) x three, using left internal mammary artery and right leg greater saphenous vein harvested endoscopically;  Surgeon: Rexene Alberts, MD;  Location: [place];  Service: Open Heart Surgery;  Laterality: N/A;   CORONARY/GRAFT ANGIOGRAPHY N/A 01/16/2018   Procedure: CORONARY/GRAFT ANGIOGRAPHY;  Surgeon: Nelva Bush, MD;  Location: [place] [facility];  Service: Cardiovascular;  Laterality: N/A;   ELBOW SURGERY Left    hemorhoidectomy     RIGHT HEART CATH N/A 01/16/2018   Procedure: RIGHT HEART CATH;  Surgeon: Nelva Bush, MD;  Location: [place] [facility];  Service: Cardiovascular;  Laterality: N/A;   SHOULDER ARTHROSCOPY WITH ROTATOR CUFF REPAIR Right    SHOULDER SURGERY Right    TEE WITHOUT CARDIOVERSION N/A 09/27/2017   Procedure: TRANSESOPHAGEAL ECHOCARDIOGRAM (TEE);  Surgeon: Dorothy Spark, MD;  Location: [facility] ENDOSCOPY;  Service: Cardiovascular;  Laterality: N/A;   TEE WITHOUT CARDIOVERSION N/A 02/22/2018   Procedure: TRANSESOPHAGEAL ECHOCARDIOGRAM (TEE);  Surgeon: Rexene Alberts, MD;  Location: [place];  Service: Open Heart Surgery;  Laterality: N/A;   THORACIC AORTIC ANEURYSM REPAIR N/A 02/22/2018   Procedure: THORACIC ASCENDING ANEURYSM REPAIR (AAA) Resection of ascending aorta aneurysm;  Surgeon: Rexene Alberts, MD;  Location: [facility] OR;  Service: Open Heart Surgery;  Laterality: N/A;    Social History   Socioeconomic History   Marital status: Widowed    Spouse name: Not on file   Number of children: Not on file   Years of education: Not on file   Highest education level: Not on file  Occupational History   Not on file  Tobacco Use   Smoking  status: Never   Smokeless tobacco: Never  Vaping Use   Vaping Use: Never used  Substance and Sexual Activity   Alcohol use: No   Drug use: No   Sexual activity: Not Currently  Other Topics Concern   Not on file  Social History Narrative   Not on file   Social Determinants of Health   Financial Resource Strain: Not on file  Food Insecurity: Not on file  Transportation Needs: Not on file  Physical Activity: Not on file  Stress: Not on file  Social Connections: Not on file  Intimate Partner Violence: Not on file    Family History  Problem Relation Age of Onset   Hypertension Mother    Stroke Mother    Leukemia Father    Heart attack Paternal Grandmother    Stroke Paternal Grandfather    Heart Problems Son 10   Heart Problems Son 49   Uterine cancer Sister    Prostate cancer Brother    Colon cancer Brother    Kidney failure Brother    Stroke Sister      Current Outpatient Medications:  amiodarone (PACERONE) 100 MG tablet, Take 1 tablet (100 mg total) by mouth daily., Disp: 60 tablet, Rfl: 5   amLODipine (NORVASC) 10 MG tablet, TAKE 1 TABLET BY MOUTH EVERY DAY, Disp: 90 tablet, Rfl: 0   ascorbic acid (VITAMIN C) 500 MG tablet, Take 500 mg by mouth daily., Disp: , Rfl:    aspirin EC 81 MG tablet, Take 81 mg by mouth daily., Disp: , Rfl:    atorvastatin (LIPITOR) 20 MG tablet, Take 1 tablet (20 mg total) by mouth daily., Disp: 90 tablet, Rfl: 3   cholecalciferol (VITAMIN D3) 25 MCG (1000 UT) tablet, Take 1,000 Units by mouth daily., Disp: , Rfl:    doxazosin (CARDURA) 4 MG tablet, TAKE 1 TABLET BY MOUTH EVERY DAY, Disp: 90 tablet, Rfl: 0   FARXIGA 10 MG TABS tablet, Take 1 tablet (10 mg total) by mouth daily., Disp: 90 tablet, Rfl: 3   ferrous sulfate 325 (65 FE) MG EC tablet, Take 325 mg by mouth daily with breakfast. , Disp: , Rfl:    hydrALAZINE (APRESOLINE) 100 MG tablet, TAKE 1 TABLET BY MOUTH 3 TIMES DAILY., Disp: 90 tablet, Rfl: 0   isosorbide mononitrate (IMDUR)  30 MG 24 hr tablet, Take 1 tablet (30 mg total) by mouth in the morning and at bedtime., Disp: , Rfl:    omeprazole (PRILOSEC) 20 MG capsule, Take 20 mg by mouth daily., Disp: , Rfl:    torsemide (DEMADEX) 10 MG tablet, Take 10 mg by mouth daily as needed., Disp: , Rfl:    levothyroxine (SYNTHROID) 25 MCG tablet, Take 25 mcg by mouth every morning. (Patient not taking: Reported on 07/13/2021), Disp: , Rfl:    nitroGLYCERIN (NITROSTAT) 0.4 MG SL tablet, Place 1 tablet (0.4 mg total) under the tongue every 5 (five) minutes as needed for chest pain. Maximum of 3 doses. (Patient not taking: Reported on 07/13/2021), Disp: 25 tablet, Rfl: 2   phenazopyridine (PYRIDIUM) 95 MG tablet, Take 1 tablet (95 mg total) by mouth 3 (three) times daily as needed for pain. (Patient not taking: Reported on 07/13/2021), Disp: 20 tablet, Rfl: 0  Physical exam:  Vitals:   07/13/21 1129  BP: 140/67  Pulse: (!) 56  Resp: 17  Temp: 97.7 F (36.5 C)  SpO2: 97%  Weight: 177 lb (80.3 kg)   Physical Exam Constitutional:      General: He is not in acute distress.    Appearance: He is well-developed.  HENT:     Mouth/Throat:     Pharynx: No oropharyngeal exudate.  Eyes:     General: No scleral icterus. Cardiovascular:     Rate and Rhythm: Normal rate and regular rhythm.  Pulmonary:     Effort: Pulmonary effort is normal.     Breath sounds: Normal breath sounds. No wheezing.  Abdominal:     General: There is no distension.     Palpations: Abdomen is soft.     Tenderness: There is no abdominal tenderness.  Musculoskeletal:     Comments: Ambulatory w/o aids  Skin:    General: Skin is warm and dry.     Coloration: Skin is not pale.     Findings: Bruising present.  Neurological:     Mental Status: He is alert and oriented to person, place, and time.  Psychiatric:        Mood and Affect: Mood normal.        Behavior: Behavior normal.     CMP Latest Ref Rng & Units 03/20/2021  Glucose 65 - 99 mg/dL 95  BUN 8 -  27 mg/dL 36(H)  Creatinine 0.76 - 1.27 mg/dL 2.63(H)  Sodium 134 - 144 mmol/L 136  Potassium 3.5 - 5.2 mmol/L 4.2  Chloride 96 - 106 mmol/L 106  CO2 20 - 29 mmol/L 15(L)  Calcium 8.6 - 10.2 mg/dL 8.0(L)  Total Protein 6.5 - 8.1 g/dL -  Total Bilirubin 0.3 - 1.2 mg/dL -  Alkaline Phos 38 - 126 U/L -  AST 15 - 41 U/L -  ALT 0 - 44 U/L -   CBC Latest Ref Rng & Units 07/13/2021  WBC 4.0 - 10.5 K/uL 5.2  Hemoglobin 13.0 - 17.0 g/dL 10.1(L)  Hematocrit 39.0 - 52.0 % 30.7(L)  Platelets 150 - 400 K/uL 124(L)     Assessment and plan- Patient is a 85 y.o. male with history of iron deficiency anemia and anemia of chronic kidney disease, who returns to clinic for routine follow up.   Iron Deficiency Anemia- IV iron in July 2021. Hemoglobin stable between 10-11. Iron studies today are pending but previously normal. He does not require IV iron today.  Anemia of CKD- Hemoglobin stable at 10.1. GFR previously 21. No indication for initiation of EPO at this time.  Thrombocytopenia- platelet count 124. Monitor.   RTC 3 months for labs (cbc, ferritin, iron studies) 6 months - labs (cbc, cmp, ferritin, iron studies), follow up with Dr. Janese Banks, possible venofer.    Visit Diagnosis 1. Iron deficiency anemia, unspecified iron deficiency anemia type   2. Anemia in chronic kidney disease, unspecified CKD stage     Beckey Rutter, DNP, AGNP-C [place] at [facility] [phone] (clinic) 07/13/2021 12:26 PM

## 2021-07-13 NOTE — Progress Notes (Signed)
6 mo follow-up for anemia. States that his BP has been elevated the past week or so. He has appt with cardiology this week to f/u on BP, otherwise denies any changes in his health since last visit.

## 2021-07-16 NOTE — Progress Notes (Signed)
Cardiology Office Note:    Date:  07/17/2021   ID:  Rodney Wiley, DOB [birth year], MRN [id number]  PCP:  Remi Haggard, FNP  [facility] Cardiologist:  Nelva Bush, MD  [place] Electrophysiologist:  None   Referring MD: Remi Haggard, FNP   Chief Complaint: 6 week follow-up  History of Present Illness:    Rodney Wiley is a 85 y.o. male with a hx of with a hx of HTN, CAD s/p CABG 02/2018, aortic regurgitation and thoracic aortic aneurysm s/p Bentall with bioprosthetic aortic valve replacement 02/2018, SVT, HFpEF, CKD stage 3 with possible renal artery stenosis who presents for follow-up.    Patient was seen early this year and EKG noted to have frequent PACs and Dr. Saunders Revel started amiodarone. Echo was ordered.    He was seen in an urgent care and sent to th ED for UTI, incomplete bladder emptying, urinary retention, and worsening renal function. He was sent home with antibiotics and PRN pyridium.    Echo 03/17/21 showed LVEF 60-65%, no WMA, mild LVH, RVSF, normal PASP, mild to mod MR, bioprosthetic AV functioning normally with mean gradient 20.44mmHg, RA pressures 57mmHg.   Seen 03/20/21 for amiodarone follow-up. EKG showed SB with 1st degree AV block, and amiodarone was rediced to 100mg  daily.   Seen 04/13/21 and HR was 49bpm and coreg was reduced to 3.125mg  BID. He was stable at follow-up, 5/17. And HR was 53bpm. Meds continued.   Last seen 06/04/21 and EKG showed SB with PR prolongation. Coreg was discontinued.   Today, the patient reports some higher numbers in the BP. Highest was 172/85, 169/85. He takes BP about an hour after his medications. He feels hot when Bps are higher. He had a few episodes of dizziness, but this is not significant. No chest pain or SOB. He had chronic LEE, R>L. No orthopnea, or pnd. He takes Torsemide as needed, about it 3-4 times a week, depending on lower leg edema. EKG shows PRI 344ms, which is worse than the last visit, but similar to prior  EKGs. He reports he is still taking Coreg, even though this was stopped at the last appointment. Also reports he is on Amlodipine 10mg , which for some reason was deleted from his list, we will add this back on.  Past Medical History:  Diagnosis Date   Anemia    Aortic insufficiency    a. 02/2018 s/p bioprosthetic AVR; 04/2018 Echo: EF 50-55%, Gr2 DD, Ao bioprosthesis, mean grad 20mmHg, Ao root/Asc Ao nl in size, mild MR, mildly dil LA, nl RV fxn. Nl PASP.   Arthropathy    Ascending aortic aneurysm ([place])    a. 12/2016 MRA: 4.3cm @ sinus of valsalva, 4.9cm above sinotubular jxn; b. 02/2018 s/p biological Bentall and AVR.   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 02/2018 Cath: LAD 20ost, 40p, 69m, LCX 60p, OM2 60, OM3 85, RCA 10p/m w/ L->R and R->R collats;  b. 02/2018 CABG x 3: LIMA to LAD, SVG to OM2, SVG to PDA, EVH via right thigh.   CKD (chronic kidney disease), stage III (HCC)    Diverticulitis    Essential hypertension    GERD (gastroesophageal reflux disease)    Hemorrhoids    History of chicken pox    History of Helicobacter pylori infection 06/2007   Hyperlipidemia    Hypertension    Left renal artery stenosis ([place])    a. 01/2017 RA u/s:  moderate L RAS.   Lower extremity edema    Nonrheumatic mitral (valve) insufficiency    a. 12/2016 Echo: mild to mod MR; b. 09/2017 TEE: mild to mod MR; c. 04/2018 Echo: Mild MR.   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 s/p 21 mm Edwards Inspiris Resilia stented bovine pericardial tissue valve and 24 mm Gelweave Valsalva synthetic root conduit with reimplantation of left main coronary artery.    Past Surgical History:  Procedure Laterality Date   AORTIC VALVE REPAIR N/A 02/22/2018   Procedure: AORTIC VALVE REPLACEMENT -Biological Bentall aortic root replacement,;  Surgeon: Rexene Alberts, MD;   Location: [place];  Service: Open Heart Surgery;  Laterality: N/A;   bypass surgery     CARDIAC CATHETERIZATION     01/16/2018   COLONOSCOPY  2011   by Dr. Candace Cruise with findings of diverticulosis   CORONARY ARTERY BYPASS GRAFT N/A 02/22/2018   Procedure: CORONARY ARTERY BYPASS GRAFTING (CABG) x three, using left internal mammary artery and right leg greater saphenous vein harvested endoscopically;  Surgeon: Rexene Alberts, MD;  Location: [facility];  Service: Open Heart Surgery;  Laterality: N/A;   CORONARY/GRAFT ANGIOGRAPHY N/A 01/16/2018   Procedure: CORONARY/GRAFT ANGIOGRAPHY;  Surgeon: Nelva Bush, MD;  Location: [facility];  Service: Cardiovascular;  Laterality: N/A;   ELBOW SURGERY Left    hemorhoidectomy     RIGHT HEART CATH N/A 01/16/2018   Procedure: RIGHT HEART CATH;  Surgeon: Nelva Bush, MD;  Location: [facility];  Service: Cardiovascular;  Laterality: N/A;   SHOULDER ARTHROSCOPY WITH ROTATOR CUFF REPAIR Right    SHOULDER SURGERY Right    TEE WITHOUT CARDIOVERSION N/A 09/27/2017   Procedure: TRANSESOPHAGEAL ECHOCARDIOGRAM (TEE);  Surgeon: Dorothy Spark, MD;  Location: [facility] ENDOSCOPY;  Service: Cardiovascular;  Laterality: N/A;   TEE WITHOUT CARDIOVERSION N/A 02/22/2018   Procedure: TRANSESOPHAGEAL ECHOCARDIOGRAM (TEE);  Surgeon: Rexene Alberts, MD;  Location: [facility];  Service: Open Heart Surgery;  Laterality: N/A;   THORACIC AORTIC ANEURYSM REPAIR N/A 02/22/2018   Procedure: THORACIC ASCENDING ANEURYSM REPAIR (AAA) Resection of ascending aorta aneurysm;  Surgeon: Rexene Alberts, MD;  Location: [facility];  Service: Open Heart Surgery;  Laterality: N/A;    Current Medications: Current Meds  Medication Sig   amiodarone (PACERONE) 100 MG tablet Take 1 tablet (100 mg total) by mouth daily.   amLODipine (NORVASC) 10 MG tablet TAKE 1 TABLET BY MOUTH EVERY DAY   ascorbic acid (VITAMIN C) 500 MG tablet Take 500 mg by mouth daily.   aspirin EC 81 MG tablet Take 81 mg by  mouth daily.   atorvastatin (LIPITOR) 20 MG tablet Take 1 tablet (20 mg total) by mouth daily.   cholecalciferol (VITAMIN D3) 25 MCG (1000 UT) tablet Take 1,000 Units by mouth daily.   doxazosin (CARDURA) 4 MG tablet TAKE 1 TABLET BY MOUTH EVERY DAY   FARXIGA 10 MG TABS tablet Take 1 tablet (10 mg total) by mouth daily.   ferrous sulfate 325 (65 FE) MG EC tablet Take 325 mg by mouth daily with breakfast.    hydrALAZINE (APRESOLINE) 100 MG tablet TAKE 1 TABLET BY MOUTH 3 TIMES DAILY.   levothyroxine (SYNTHROID) 25 MCG tablet Take 25 mcg by mouth every morning.   nitroGLYCERIN (NITROSTAT) 0.4 MG SL tablet Place 1 tablet (0.4 mg total) under the tongue every 5 (five) minutes as needed  for chest pain. Maximum of 3 doses.   omeprazole (PRILOSEC) 20 MG capsule Take 20 mg by mouth daily.   phenazopyridine (PYRIDIUM) 95 MG tablet Take 1 tablet (95 mg total) by mouth 3 (three) times daily as needed for pain.   torsemide (DEMADEX) 10 MG tablet Take 10 mg by mouth daily as needed.   [DISCONTINUED] carvedilol (COREG) 3.125 MG tablet Take 3.125 mg by mouth 2 (two) times daily.   [DISCONTINUED] isosorbide mononitrate (IMDUR) 30 MG 24 hr tablet Take 1 tablet (30 mg total) by mouth in the morning and at bedtime.   [DISCONTINUED] isosorbide mononitrate (IMDUR) 60 MG 24 hr tablet Take 1 tablet (60 mg total) by mouth daily.     Allergies:   Patient has no known allergies.   Social History   Socioeconomic History   Marital status: Widowed    Spouse name: Not on file   Number of children: Not on file   Years of education: Not on file   Highest education level: Not on file  Occupational History   Not on file  Tobacco Use   Smoking status: Never   Smokeless tobacco: Never  Vaping Use   Vaping Use: Never used  Substance and Sexual Activity   Alcohol use: No   Drug use: No   Sexual activity: Not Currently  Other Topics Concern   Not on file  Social History Narrative   Not on file   Social  Determinants of Health   Financial Resource Strain: Not on file  Food Insecurity: Not on file  Transportation Needs: Not on file  Physical Activity: Not on file  Stress: Not on file  Social Connections: Not on file     Family History: The patient's family history includes Colon cancer in his brother; Heart Problems (age of onset: 36) in his son and son; Heart attack in his paternal grandmother; Hypertension in his mother; Kidney failure in his brother; Leukemia in his father; Prostate cancer in his brother; Stroke in his mother, paternal grandfather, and sister; Uterine cancer in his sister.  ROS:   Please see the history of present illness.     All other systems reviewed and are negative.  EKGs/Labs/Other Studies Reviewed:    The following studies were reviewed today:  Echo 03/17/21  1. Left ventricular ejection fraction, by estimation, is 60 to 65%. Left  ventricular ejection fraction by 3D volume is 69 %. The left ventricle has  normal function. The left ventricle has no regional wall motion  abnormalities. There is mild left  ventricular hypertrophy. Left ventricular diastolic parameters are  indeterminate.   2. Right ventricular systolic function is low normal. The right  ventricular size is normal. There is normal pulmonary artery systolic  pressure.   3. The mitral valve is normal in structure. Mild to moderate mitral valve  regurgitation.   4. The aortic valve has been repaired/replaced. Aortic valve  regurgitation is not visualized. There is a Edwards bioprosthetic valve  present in the aortic position. Echo findings are consistent with normal  structure and function of the aortic valve  prosthesis. Aortic valve mean gradient measures 20.5 mmHg.   5. The inferior vena cava is normal in size with greater than 50%  respiratory variability, suggesting right atrial pressure of 3 mmHg.   Long term monitor 01/2019 The patient was monitored for 2 days, 22 hours. The  predominant rhythm was sinus with an average rate of 61 bpm (range 44-114 bpm in sinus). First degree AV  block was noted. There were rare PAC's and frequent PVC's, including periods of ventricular bigeminy. 19 atrial runs were observed, lasting up to 11 beats with a maximum rate of 164 bpm. No sustained arrhythmia or prolonged pause was observed.   Predominantly sinus rhythm with rare PAC's and frequent PVC's.  Several brief atrial runs were noted.   Cardiac cath 01/2018 Conclusions: Significant 3-vessel coronary artery disease, as detailed below. Mildly elevated left and right heart filling pressures. Upper normal pulmonary artery pressure. Normal Fick cardiac output/index. Renal angiography not performed due to radiation/contrast needed for coronary angiography.   Recommendations: Proceed with aorta/aortic valve intervention, per Dr. Roxy Manns. CABG at the time of surgery will need to be considered, given multivessel CAD. Aggressive secondary prevention. We will discuss further non-invasive follow-up of renal artery stenosis when Mr. Goley returns for follow-up.   Nelva Bush, MD [facility] Pager: [phone]  EKG:  EKG is  ordered today.  The ekg ordered today demonstrates SB, 52bpm, 1st degree AV block, PRI 310ms, LAD  Recent Labs: 02/27/2021: Magnesium 2.1; TSH 3.473 03/04/2021: ALT 21 03/20/2021: BUN 36; Creatinine, Ser 2.63; Potassium 4.2; Sodium 136 07/13/2021: Hemoglobin 10.1; Platelets 124  Recent Lipid Panel    Component Value Date/Time   CHOL 132 09/15/2018 1115   TRIG 214 (H) 09/15/2018 1115   HDL 33 (L) 09/15/2018 1115   CHOLHDL 4.0 09/15/2018 1115   CHOLHDL 3.8 03/11/2017 0753   VLDL 39 03/11/2017 0753   LDLCALC 56 09/15/2018 1115     Physical Exam:    VS:  BP 130/60 (BP Location: Left Arm, Patient Position: Sitting, Cuff Size: Normal)   Pulse (!) 52   Ht 5\' 7"  (1.702 m)   Wt 178 lb 4 oz (80.9 kg)   SpO2 98%   BMI 27.92 kg/m     Wt Readings from  Last 3 Encounters:  07/17/21 178 lb 4 oz (80.9 kg)  07/13/21 177 lb (80.3 kg)  06/04/21 176 lb (79.8 kg)     GEN:  Well nourished, well developed in no acute distress HEENT: Normal NECK: No JVD; No carotid bruits LYMPHATICS: No lymphadenopathy CARDIAC: bradycardia, RR, + murmur, rubs, gallops RESPIRATORY:  Clear to auscultation without rales, wheezing or rhonchi  ABDOMEN: Soft, non-tender, non-distended MUSCULOSKELETAL:  Trace lower leg edema; No deformity  SKIN: Warm and dry NEUROLOGIC:  Alert and oriented x 3 PSYCHIATRIC:  Normal affect   ASSESSMENT:    1. Labile hypertension   2. Chronic diastolic heart failure ([place])   3. Bradycardia   4. First degree AV block   5. Chronic kidney disease, stage IV (severe) ([place])   6. Thoracic aortic aneurysm without rupture ([place])   7. Coronary artery disease of native artery of native heart with stable angina pectoris ([place])   8. Hypertension, unspecified type   9. S/P AVR    PLAN:    In order of problems listed above:  Bradycardia/PSVT First degree AV block He is on amiodarone 100mg  for ectopy. Coreg stopped at the last visit for bradycardia and increasing PRI, however he reported he still has been taking Coreg. EKG today shows SB 52bpm with PRI today 360ms. Patient is asymptomatic. Patient was instructed to stop Coreg. 6 week follow-up for heart rate and BP monitoring.  HFpEF AI s/p AVR Patient takes lasix as needed, depending on lower leg edema. He takes Torsemide about 3-4 times a week. CHF education discussed. Continue BB and torsemide PRN.  HTN BP at home 140-150s/60-70s. Today int he  office BP 130-60. Stop Coreg as above. I will increase Imdur to 60mg  daily. He will takes his Bps daily and call back to report log. He also takes hydralazine 100mg  TID, Cardura 4mg  daily, Farxiga, 10mg  daily, amlodipine 10mg  daily. Re-evaluate BP in 6 weeks.   CAD s/p CABG in 2019 Patient denies anginal symptoms. No plan for ischemic evaluation at  this time. Continue Aspirin, statin, Imdur. No BB for bradycardia/first degree AV block. He has SL NTG.   Thoracic aortic aneurysm S/p Bentall procedure. Echo in April 2022 showed normal valve function. Continue with blood pressure control.   CKD stage IV Stable by labs 03/2021.   Disposition: Follow up in 3 month(s) with MD/APP   Signed, Lesley Galentine Ninfa Meeker, PA-C  07/17/2021 12:57 PM    [facility]

## 2021-07-17 ENCOUNTER — Encounter: Payer: Self-pay | Admitting: Medical

## 2021-07-17 ENCOUNTER — Ambulatory Visit: Payer: Medicare HMO | Admitting: Medical

## 2021-07-17 ENCOUNTER — Other Ambulatory Visit: Payer: Self-pay

## 2021-07-17 VITALS — BP 130/60 | HR 52 | Ht 67.0 in | Wt 178.2 lb

## 2021-07-17 DIAGNOSIS — I5032 Chronic diastolic (congestive) heart failure: Secondary | ICD-10-CM | POA: Diagnosis not present

## 2021-07-17 DIAGNOSIS — R001 Bradycardia, unspecified: Secondary | ICD-10-CM

## 2021-07-17 DIAGNOSIS — Z952 Presence of prosthetic heart valve: Secondary | ICD-10-CM

## 2021-07-17 DIAGNOSIS — I712 Thoracic aortic aneurysm, without rupture, unspecified: Secondary | ICD-10-CM

## 2021-07-17 DIAGNOSIS — I440 Atrioventricular block, first degree: Secondary | ICD-10-CM | POA: Diagnosis not present

## 2021-07-17 DIAGNOSIS — I25118 Atherosclerotic heart disease of native coronary artery with other forms of angina pectoris: Secondary | ICD-10-CM

## 2021-07-17 DIAGNOSIS — N184 Chronic kidney disease, stage 4 (severe): Secondary | ICD-10-CM

## 2021-07-17 DIAGNOSIS — I10 Essential (primary) hypertension: Secondary | ICD-10-CM

## 2021-07-17 DIAGNOSIS — R0989 Other specified symptoms and signs involving the circulatory and respiratory systems: Secondary | ICD-10-CM | POA: Diagnosis not present

## 2021-07-17 MED ORDER — ISOSORBIDE MONONITRATE ER 60 MG PO TB24: 60.0000 mg | ORAL_TABLET | Freq: Every day | ORAL | 3 refills | Status: DC

## 2021-07-17 NOTE — Patient Instructions (Addendum)
Medication Instructions:   Please INCREASE IMDUR to 60 mg daily  *If you need a refill on your cardiac medications before your next appointment, please call your pharmacy*   Lab Work: None  Testing/Procedures: None   Follow-Up: At [facility], you and your health needs are our priority.  As part of our continuing mission to provide you with exceptional heart care, we have created designated Provider Care Teams.  These Care Teams include your primary Cardiologist (physician) and Advanced Practice Providers (APPs -  Physician Assistants and Nurse Practitioners) who all work together to provide you with the care you need, when you need it.  Your next appointment:   3 month(s)  The format for your next appointment:   In Person  Provider:   Cadence Kathlen Mody, PA-C

## 2021-07-29 DIAGNOSIS — E039 Hypothyroidism, unspecified: Secondary | ICD-10-CM | POA: Diagnosis not present

## 2021-07-29 DIAGNOSIS — E7800 Pure hypercholesterolemia, unspecified: Secondary | ICD-10-CM | POA: Diagnosis not present

## 2021-07-29 DIAGNOSIS — I10 Essential (primary) hypertension: Secondary | ICD-10-CM | POA: Diagnosis not present

## 2021-07-29 DIAGNOSIS — D519 Vitamin B12 deficiency anemia, unspecified: Secondary | ICD-10-CM | POA: Diagnosis not present

## 2021-07-29 DIAGNOSIS — R5383 Other fatigue: Secondary | ICD-10-CM | POA: Diagnosis not present

## 2021-07-29 DIAGNOSIS — E559 Vitamin D deficiency, unspecified: Secondary | ICD-10-CM | POA: Diagnosis not present

## 2021-08-07 ENCOUNTER — Other Ambulatory Visit: Payer: Self-pay | Admitting: Internal Medicine

## 2021-08-18 ENCOUNTER — Other Ambulatory Visit: Payer: Self-pay | Admitting: Internal Medicine

## 2021-08-18 DIAGNOSIS — I471 Supraventricular tachycardia: Secondary | ICD-10-CM

## 2021-08-19 DIAGNOSIS — I788 Other diseases of capillaries: Secondary | ICD-10-CM | POA: Diagnosis not present

## 2021-08-19 DIAGNOSIS — D2272 Melanocytic nevi of left lower limb, including hip: Secondary | ICD-10-CM | POA: Diagnosis not present

## 2021-08-19 DIAGNOSIS — D2262 Melanocytic nevi of left upper limb, including shoulder: Secondary | ICD-10-CM | POA: Diagnosis not present

## 2021-08-19 DIAGNOSIS — X32XXXA Exposure to sunlight, initial encounter: Secondary | ICD-10-CM | POA: Diagnosis not present

## 2021-08-19 DIAGNOSIS — D2271 Melanocytic nevi of right lower limb, including hip: Secondary | ICD-10-CM | POA: Diagnosis not present

## 2021-08-19 DIAGNOSIS — D225 Melanocytic nevi of trunk: Secondary | ICD-10-CM | POA: Diagnosis not present

## 2021-08-19 DIAGNOSIS — L570 Actinic keratosis: Secondary | ICD-10-CM | POA: Diagnosis not present

## 2021-08-19 DIAGNOSIS — D2261 Melanocytic nevi of right upper limb, including shoulder: Secondary | ICD-10-CM | POA: Diagnosis not present

## 2021-08-21 ENCOUNTER — Other Ambulatory Visit: Payer: Self-pay | Admitting: Internal Medicine

## 2021-08-21 DIAGNOSIS — I471 Supraventricular tachycardia: Secondary | ICD-10-CM

## 2021-09-01 NOTE — Progress Notes (Signed)
Cardiology Office Note:    Date:  09/02/2021   ID:  Rodney Wiley, DOB [birth year], MRN [id number]  PCP:  Remi Haggard, FNP  [facility] Cardiologist:  Nelva Bush, MD  [facility] Electrophysiologist:  None   Referring MD: Remi Haggard, FNP   Chief Complaint: 1 month follow-up  History of Present Illness:    Rodney Wiley is a 85 y.o. male with a hx of HTN, CAD s/p CABG 02/2018, aortic regurgitation and thoracic aortic aneurysm s/p Bentall with bioprosthetic aortic valve replacement 02/2018, SVT, HFpEF, CKD stage 3 with possible renal artery stenosis who presents for follow-up.    Patient was seen early this year and EKG noted to have frequent PACs and Dr. Saunders Revel started amiodarone. Echo was ordered.    He was seen in an urgent care and sent to th ED for UTI, incomplete bladder emptying, urinary retention, and worsening renal function. He was sent home with antibiotics and PRN pyridium.    Echo 03/17/21 showed LVEF 60-65%, no WMA, mild LVH, RVSF, normal PASP, mild to mod MR, bioprosthetic AV functioning normally with mean gradient 20.107mmHg, RA pressures 17mmHg.   Seen 03/20/21 for amiodarone follow-up. EKG showed SB with 1st degree AV block, and amiodarone was rediced to 100mg  daily.    Seen 04/13/21 and HR was 49bpm and coreg was reduced to 3.125mg  BID. He was stable at follow-up, 5/17. And HR was 53bpm. Meds continued.    Last seen 06/04/21 and EKG showed SB with PR prolongation. Coreg was discontinued.   Patient was last seen 07/17/21 and BP Was high. He had not stopped coreg so this was reiterated to stop.   Today, the patient reports he has been doing well since the last visit. No chest pain or shortness of breath. No dizziness or lightheadedness. EKG with SB, 1st degree AV block with PRI 327ms. He takes torsemide 10mg  as needed. He has minimal LLE. No orthopnea or pnd. HE follows low/no salt diet. Wheezing on exam, he has PCP apt coming up.    Past Medical History:   Diagnosis Date   Anemia    Aortic insufficiency    a. 02/2018 s/p bioprosthetic AVR; 04/2018 Echo: EF 50-55%, Gr2 DD, Ao bioprosthesis, mean grad 49mmHg, Ao root/Asc Ao nl in size, mild MR, mildly dil LA, nl RV fxn. Nl PASP.   Arthropathy    Ascending aortic aneurysm ([place])    a. 12/2016 MRA: 4.3cm @ sinus of valsalva, 4.9cm above sinotubular jxn; b. 02/2018 s/p biological Bentall and AVR.   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 02/2018 Cath: LAD 20ost, 40p, 19m, LCX 60p, OM2 60, OM3 85, RCA 10p/m w/ L->R and R->R collats;  b. 02/2018 CABG x 3: LIMA to LAD, SVG to OM2, SVG to PDA, EVH via right thigh.   CKD (chronic kidney disease), stage III (HCC)    Diverticulitis    Essential hypertension    GERD (gastroesophageal reflux disease)    Hemorrhoids    History of chicken pox    History of Helicobacter pylori infection 06/2007   Hyperlipidemia    Hypertension    Left renal artery stenosis (HCC)    a. 01/2017 RA u/s: moderate L RAS.   Lower extremity edema    Nonrheumatic mitral (valve) insufficiency    a. 12/2016 Echo: mild to mod MR; b. 09/2017 TEE: mild to mod MR; c. 04/2018 Echo: Mild MR.   Nonrheumatic pulmonary valve  insufficiency    S/P biological Bentall aortic root replacement with bioprosthetic valve and synthetic root conduit 02/22/2018   a. s/p 21 mm Edwards Inspiris [place] stented bovine pericardial tissue valve and 24 mm Gelweave Valsalva synthetic root conduit with reimplantation of left main coronary artery.    Past Surgical History:  Procedure Laterality Date   AORTIC VALVE REPAIR N/A 02/22/2018   Procedure: AORTIC VALVE REPLACEMENT -Biological Bentall aortic root replacement,;  Surgeon: Rexene Alberts, MD;  Location: [place];  Service: Open Heart Surgery;  Laterality: N/A;   bypass surgery     CARDIAC CATHETERIZATION     01/16/2018   COLONOSCOPY  2011   by Dr. Candace Cruise with findings of diverticulosis   CORONARY ARTERY BYPASS GRAFT N/A 02/22/2018    Procedure: CORONARY ARTERY BYPASS GRAFTING (CABG) x three, using left internal mammary artery and right leg greater saphenous vein harvested endoscopically;  Surgeon: Rexene Alberts, MD;  Location: [place];  Service: Open Heart Surgery;  Laterality: N/A;   CORONARY/GRAFT ANGIOGRAPHY N/A 01/16/2018   Procedure: CORONARY/GRAFT ANGIOGRAPHY;  Surgeon: Nelva Bush, MD;  Location: [place] [facility];  Service: Cardiovascular;  Laterality: N/A;   ELBOW SURGERY Left    hemorhoidectomy     RIGHT HEART CATH N/A 01/16/2018   Procedure: RIGHT HEART CATH;  Surgeon: Nelva Bush, MD;  Location: [place] [facility];  Service: Cardiovascular;  Laterality: N/A;   SHOULDER ARTHROSCOPY WITH ROTATOR CUFF REPAIR Right    SHOULDER SURGERY Right    TEE WITHOUT CARDIOVERSION N/A 09/27/2017   Procedure: TRANSESOPHAGEAL ECHOCARDIOGRAM (TEE);  Surgeon: Dorothy Spark, MD;  Location: [facility];  Service: Cardiovascular;  Laterality: N/A;   TEE WITHOUT CARDIOVERSION N/A 02/22/2018   Procedure: TRANSESOPHAGEAL ECHOCARDIOGRAM (TEE);  Surgeon: Rexene Alberts, MD;  Location: [place];  Service: Open Heart Surgery;  Laterality: N/A;   THORACIC AORTIC ANEURYSM REPAIR N/A 02/22/2018   Procedure: THORACIC ASCENDING ANEURYSM REPAIR (AAA) Resection of ascending aorta aneurysm;  Surgeon: Rexene Alberts, MD;  Location: [facility] OR;  Service: Open Heart Surgery;  Laterality: N/A;    Current Medications: No outpatient medications have been marked as taking for the 09/02/21 encounter (Appointment) with Kathlen Mody, Arya Boxley H, PA-C.     Allergies:   Patient has no known allergies.   Social History   Socioeconomic History   Marital status: Widowed    Spouse name: Not on file   Number of children: Not on file   Years of education: Not on file   Highest education level: Not on file  Occupational History   Not on file  Tobacco Use   Smoking status: Never   Smokeless tobacco: Never  Vaping Use   Vaping Use: Never used  Substance  and Sexual Activity   Alcohol use: No   Drug use: No   Sexual activity: Not Currently  Other Topics Concern   Not on file  Social History Narrative   Not on file   Social Determinants of Health   Financial Resource Strain: Not on file  Food Insecurity: Not on file  Transportation Needs: Not on file  Physical Activity: Not on file  Stress: Not on file  Social Connections: Not on file     Family History: The patient's family history includes Colon cancer in his brother; Heart Problems (age of onset: 31) in his son and son; Heart attack in his paternal grandmother; Hypertension in his mother; Kidney failure in his brother; Leukemia in his father; Prostate cancer in his brother;  Stroke in his mother, paternal grandfather, and sister; Uterine cancer in his sister.  ROS:   Please see the history of present illness.     All other systems reviewed and are negative.  EKGs/Labs/Other Studies Reviewed:    The following studies were reviewed today:    Echo 03/17/21  1. Left ventricular ejection fraction, by estimation, is 60 to 65%. Left  ventricular ejection fraction by 3D volume is 69 %. The left ventricle has  normal function. The left ventricle has no regional wall motion  abnormalities. There is mild left  ventricular hypertrophy. Left ventricular diastolic parameters are  indeterminate.   2. Right ventricular systolic function is low normal. The right  ventricular size is normal. There is normal pulmonary artery systolic  pressure.   3. The mitral valve is normal in structure. Mild to moderate mitral valve  regurgitation.   4. The aortic valve has been repaired/replaced. Aortic valve  regurgitation is not visualized. There is a Edwards bioprosthetic valve  present in the aortic position. Echo findings are consistent with normal  structure and function of the aortic valve  prosthesis. Aortic valve mean gradient measures 20.5 mmHg.   5. The inferior vena cava is normal in size  with greater than 50%  respiratory variability, suggesting right atrial pressure of 3 mmHg.    Long term monitor 01/2019 The patient was monitored for 2 days, 22 hours. The predominant rhythm was sinus with an average rate of 61 bpm (range 44-114 bpm in sinus). First degree AV block was noted. There were rare PAC's and frequent PVC's, including periods of ventricular bigeminy. 19 atrial runs were observed, lasting up to 11 beats with a maximum rate of 164 bpm. No sustained arrhythmia or prolonged pause was observed.   Predominantly sinus rhythm with rare PAC's and frequent PVC's.  Several brief atrial runs were noted.   Cardiac cath 01/2018 Conclusions: Significant 3-vessel coronary artery disease, as detailed below. Mildly elevated left and right heart filling pressures. Upper normal pulmonary artery pressure. Normal Fick cardiac output/index. Renal angiography not performed due to radiation/contrast needed for coronary angiography.   Recommendations: Proceed with aorta/aortic valve intervention, per Dr. Roxy Manns. CABG at the time of surgery will need to be considered, given multivessel CAD. Aggressive secondary prevention. We will discuss further non-invasive follow-up of renal artery stenosis when Mr. Hanton returns for follow-up.   Nelva Bush, MD [facility] Pager: [phone]  EKG:  EKG is  ordered today.  The ekg ordered today demonstrates SB 55bpm,1st degree AV block with PAC, PRI 324ms, TWI aVL, LAD, LAFB  Recent Labs: 02/27/2021: Magnesium 2.1; TSH 3.473 03/04/2021: ALT 21 03/20/2021: BUN 36; Creatinine, Ser 2.63; Potassium 4.2; Sodium 136 07/13/2021: Hemoglobin 10.1; Platelets 124  Recent Lipid Panel    Component Value Date/Time   CHOL 132 09/15/2018 1115   TRIG 214 (H) 09/15/2018 1115   HDL 33 (L) 09/15/2018 1115   CHOLHDL 4.0 09/15/2018 1115   CHOLHDL 3.8 03/11/2017 0753   VLDL 39 03/11/2017 0753   LDLCALC 56 09/15/2018 1115    Physical Exam:    VS:   There were no vitals taken for this visit.    Wt Readings from Last 3 Encounters:  07/17/21 178 lb 4 oz (80.9 kg)  07/13/21 177 lb (80.3 kg)  06/04/21 176 lb (79.8 kg)     GEN:  Well nourished, well developed in no acute distress HEENT: Normal NECK: No JVD; No carotid bruits LYMPHATICS: No lymphadenopathy CARDIAC: RRR, no murmurs,  rubs, gallops RESPIRATORY:  +wheezing ABDOMEN: Soft, non-tender, non-distended MUSCULOSKELETAL:  No edema; No deformity  SKIN: Warm and dry NEUROLOGIC:  Alert and oriented x 3 PSYCHIATRIC:  Normal affect   ASSESSMENT:    1. 1st degree AV block   2. Stage 3 chronic kidney disease, unspecified whether stage 3a or 3b CKD ([place])   3. Chronic heart failure with preserved ejection fraction ([place])   4. Essential hypertension   5. Coronary artery disease involving native coronary artery of native heart without angina pectoris   6. Sinus bradycardia   7. Hypertension, unspecified type   8. Thoracic aortic aneurysm without rupture (HCC)    PLAN:    In order of problems listed above:  Bradycardia/PSVT First degree AV block Patient is overall feeling much better since stopping Coreg. EKG shows SB, 55bpm, PAC, with PRI 363ms, which is relatively stable from last visit. He is on low dose amiodarone for PSVT and PACs, he is tolerating this. No changes at this time. Continue to monitor PRI at follow-up.   HFpEF AI s/p AVR Patient reports stable dependent edema. Weight is 2lbs less than in August 2022. He takes Torsemide as needed. He follows low/no salt diet. Also uses compression socks and elevates his feet. No BB with bradycardia.   HTN BP good today. Continue amlodipine 10mg  daily, Cardura 4mg  daily, Imdur 60mg  daily, Hydralazine 100mg  TID.  CAD s/p CABG in 2019 Patient denies anginal symptoms. Continue Aspirin, Imdur, statin. No plan for further work-up at this time.   Thoracic aortic aneurysm S/p bio Bentall. Echo in April showed normal valve function.  Continue BP control  CKD stage IV Follows with [facility] nephrology.   Disposition: Follow up in 3 month(s) with MD/APP   Signed, Danette Weinfeld Ninfa Meeker, PA-C  09/02/2021 7:51 AM    [facility]

## 2021-09-02 ENCOUNTER — Ambulatory Visit: Payer: Medicare HMO | Admitting: Medical

## 2021-09-02 ENCOUNTER — Other Ambulatory Visit: Payer: Self-pay

## 2021-09-02 ENCOUNTER — Encounter: Payer: Self-pay | Admitting: Medical

## 2021-09-02 VITALS — BP 130/66 | HR 55 | Ht 67.0 in | Wt 176.0 lb

## 2021-09-02 DIAGNOSIS — N183 Chronic kidney disease, stage 3 unspecified: Secondary | ICD-10-CM | POA: Diagnosis not present

## 2021-09-02 DIAGNOSIS — R001 Bradycardia, unspecified: Secondary | ICD-10-CM | POA: Diagnosis not present

## 2021-09-02 DIAGNOSIS — I10 Essential (primary) hypertension: Secondary | ICD-10-CM

## 2021-09-02 DIAGNOSIS — I2510 Atherosclerotic heart disease of native coronary artery without angina pectoris: Secondary | ICD-10-CM

## 2021-09-02 DIAGNOSIS — I712 Thoracic aortic aneurysm, without rupture, unspecified: Secondary | ICD-10-CM

## 2021-09-02 DIAGNOSIS — I5032 Chronic diastolic (congestive) heart failure: Secondary | ICD-10-CM

## 2021-09-02 DIAGNOSIS — I440 Atrioventricular block, first degree: Secondary | ICD-10-CM

## 2021-09-02 NOTE — Patient Instructions (Signed)
Medication Instructions:  Your physician recommends that you continue on your current medications as directed. Please refer to the Current Medication list given to you today. *If you need a refill on your cardiac medications before your next appointment, please call your pharmacy*   Lab Work: None ordered If you have labs (blood work) drawn today and your tests are completely normal, you will receive your results only by: [place] (if you have MyChart) OR A paper copy in the mail If you have any lab test that is abnormal or we need to change your treatment, we will call you to review the results.   Testing/Procedures: None ordered   Follow-Up: At [facility], you and your health needs are our priority.  As part of our continuing mission to provide you with exceptional heart care, we have created designated Provider Care Teams.  These Care Teams include your primary Cardiologist (physician) and Advanced Practice Providers (APPs -  Physician Assistants and Nurse Practitioners) who all work together to provide you with the care you need, when you need it.  We recommend signing up for the patient portal called "MyChart".  Sign up information is provided on this After Visit Summary.  MyChart is used to connect with patients for Virtual Visits (Telemedicine).  Patients are able to view lab/test results, encounter notes, upcoming appointments, etc.  Non-urgent messages can be sent to your provider as well.   To learn more about what you can do with MyChart, go to [email or web address].    Your next appointment:   3 month(s)  The format for your next appointment:   In Person  Provider:   You may see Nelva Bush, MD or one of the following Advanced Practice Providers on your designated Care Team:   Murray Hodgkins, NP Christell Faith, PA-C Marrianne Mood, PA-C Cadence Kathlen Mody, [place]   Other Instructions

## 2021-10-13 ENCOUNTER — Other Ambulatory Visit: Payer: Self-pay

## 2021-10-13 ENCOUNTER — Inpatient Hospital Stay: Payer: Medicare HMO | Attending: Oncology

## 2021-10-13 ENCOUNTER — Ambulatory Visit: Payer: Medicare HMO | Admitting: Oncology

## 2021-10-13 DIAGNOSIS — D631 Anemia in chronic kidney disease: Secondary | ICD-10-CM

## 2021-10-13 DIAGNOSIS — N189 Chronic kidney disease, unspecified: Secondary | ICD-10-CM

## 2021-10-13 DIAGNOSIS — D509 Iron deficiency anemia, unspecified: Secondary | ICD-10-CM | POA: Insufficient documentation

## 2021-10-13 LAB — CBC WITH DIFFERENTIAL/PLATELET
Abs Immature Granulocytes: 0.02 10*3/uL (ref 0.00–0.07)
Basophils Absolute: 0 10*3/uL (ref 0.0–0.1)
Basophils Relative: 0 %
Eosinophils Absolute: 0.3 10*3/uL (ref 0.0–0.5)
Eosinophils Relative: 6 %
HCT: 32.6 % — ABNORMAL LOW (ref 39.0–52.0)
Hemoglobin: 11 g/dL — ABNORMAL LOW (ref 13.0–17.0)
Immature Granulocytes: 0 %
Lymphocytes Relative: 16 %
Lymphs Abs: 0.9 10*3/uL (ref 0.7–4.0)
MCH: 32.3 pg (ref 26.0–34.0)
MCHC: 33.7 g/dL (ref 30.0–36.0)
MCV: 95.6 fL (ref 80.0–100.0)
Monocytes Absolute: 0.5 10*3/uL (ref 0.1–1.0)
Monocytes Relative: 10 %
Neutro Abs: 3.7 10*3/uL (ref 1.7–7.7)
Neutrophils Relative %: 68 %
Platelets: 127 10*3/uL — ABNORMAL LOW (ref 150–400)
RBC: 3.41 MIL/uL — ABNORMAL LOW (ref 4.22–5.81)
RDW: 13.4 % (ref 11.5–15.5)
WBC: 5.4 10*3/uL (ref 4.0–10.5)
nRBC: 0 % (ref 0.0–0.2)

## 2021-10-13 LAB — IRON AND TIBC
Iron: 57 ug/dL (ref 45–182)
Saturation Ratios: 24 % (ref 17.9–39.5)
TIBC: 239 ug/dL — ABNORMAL LOW (ref 250–450)
UIBC: 182 ug/dL

## 2021-10-13 LAB — FERRITIN: Ferritin: 165 ng/mL (ref 24–336)

## 2021-10-23 ENCOUNTER — Ambulatory Visit: Payer: Medicare HMO | Admitting: Medical

## 2021-11-02 DIAGNOSIS — Z23 Encounter for immunization: Secondary | ICD-10-CM | POA: Diagnosis not present

## 2021-11-02 DIAGNOSIS — G603 Idiopathic progressive neuropathy: Secondary | ICD-10-CM | POA: Diagnosis not present

## 2021-11-02 DIAGNOSIS — N183 Chronic kidney disease, stage 3 unspecified: Secondary | ICD-10-CM | POA: Diagnosis not present

## 2021-11-02 DIAGNOSIS — E7800 Pure hypercholesterolemia, unspecified: Secondary | ICD-10-CM | POA: Diagnosis not present

## 2021-11-02 DIAGNOSIS — N184 Chronic kidney disease, stage 4 (severe): Secondary | ICD-10-CM | POA: Diagnosis not present

## 2021-11-02 DIAGNOSIS — M79661 Pain in right lower leg: Secondary | ICD-10-CM | POA: Diagnosis not present

## 2021-11-02 DIAGNOSIS — E039 Hypothyroidism, unspecified: Secondary | ICD-10-CM | POA: Diagnosis not present

## 2021-11-02 DIAGNOSIS — E559 Vitamin D deficiency, unspecified: Secondary | ICD-10-CM | POA: Diagnosis not present

## 2021-11-02 DIAGNOSIS — D519 Vitamin B12 deficiency anemia, unspecified: Secondary | ICD-10-CM | POA: Diagnosis not present

## 2021-11-02 DIAGNOSIS — I10 Essential (primary) hypertension: Secondary | ICD-10-CM | POA: Diagnosis not present

## 2021-11-02 DIAGNOSIS — G608 Other hereditary and idiopathic neuropathies: Secondary | ICD-10-CM | POA: Diagnosis not present

## 2021-11-02 DIAGNOSIS — I480 Paroxysmal atrial fibrillation: Secondary | ICD-10-CM | POA: Diagnosis not present

## 2021-11-16 DIAGNOSIS — Z6827 Body mass index (BMI) 27.0-27.9, adult: Secondary | ICD-10-CM | POA: Diagnosis not present

## 2021-11-16 DIAGNOSIS — E7800 Pure hypercholesterolemia, unspecified: Secondary | ICD-10-CM | POA: Diagnosis not present

## 2021-11-16 DIAGNOSIS — N183 Chronic kidney disease, stage 3 unspecified: Secondary | ICD-10-CM | POA: Diagnosis not present

## 2021-11-16 DIAGNOSIS — Z0000 Encounter for general adult medical examination without abnormal findings: Secondary | ICD-10-CM | POA: Diagnosis not present

## 2021-11-16 DIAGNOSIS — D649 Anemia, unspecified: Secondary | ICD-10-CM | POA: Diagnosis not present

## 2021-11-16 DIAGNOSIS — E039 Hypothyroidism, unspecified: Secondary | ICD-10-CM | POA: Diagnosis not present

## 2021-11-16 DIAGNOSIS — Z7189 Other specified counseling: Secondary | ICD-10-CM | POA: Diagnosis not present

## 2021-11-16 DIAGNOSIS — Z1389 Encounter for screening for other disorder: Secondary | ICD-10-CM | POA: Diagnosis not present

## 2021-11-16 DIAGNOSIS — E559 Vitamin D deficiency, unspecified: Secondary | ICD-10-CM | POA: Diagnosis not present

## 2021-11-16 DIAGNOSIS — I10 Essential (primary) hypertension: Secondary | ICD-10-CM | POA: Diagnosis not present

## 2021-11-19 ENCOUNTER — Other Ambulatory Visit: Payer: Self-pay

## 2021-11-19 ENCOUNTER — Ambulatory Visit
Admission: RE | Admit: 2021-11-19 | Discharge: 2021-11-19 | Disposition: A | Discharge status: Home / Self Care | Payer: Medicare HMO | Source: Ambulatory Visit | Attending: Internal Medicine | Admitting: Internal Medicine

## 2021-11-19 ENCOUNTER — Ambulatory Visit: Payer: Medicare HMO | Admitting: Internal Medicine

## 2021-11-19 ENCOUNTER — Encounter: Payer: Self-pay | Admitting: Internal Medicine

## 2021-11-19 VITALS — BP 116/70 | HR 54 | Ht 67.0 in | Wt 175.0 lb

## 2021-11-19 DIAGNOSIS — I25118 Atherosclerotic heart disease of native coronary artery with other forms of angina pectoris: Secondary | ICD-10-CM

## 2021-11-19 DIAGNOSIS — I471 Supraventricular tachycardia: Secondary | ICD-10-CM

## 2021-11-19 DIAGNOSIS — I5032 Chronic diastolic (congestive) heart failure: Secondary | ICD-10-CM | POA: Insufficient documentation

## 2021-11-19 DIAGNOSIS — Z79899 Other long term (current) drug therapy: Secondary | ICD-10-CM

## 2021-11-19 DIAGNOSIS — I10 Essential (primary) hypertension: Secondary | ICD-10-CM

## 2021-11-19 DIAGNOSIS — J439 Emphysema, unspecified: Secondary | ICD-10-CM | POA: Diagnosis not present

## 2021-11-19 DIAGNOSIS — E785 Hyperlipidemia, unspecified: Secondary | ICD-10-CM | POA: Diagnosis not present

## 2021-11-19 MED ORDER — TORSEMIDE 10 MG PO TABS: 10.0000 mg | ORAL_TABLET | Freq: Every day | ORAL | 0 refills | Status: DC | PRN

## 2021-11-19 MED ORDER — ATORVASTATIN CALCIUM 20 MG PO TABS: 20.0000 mg | ORAL_TABLET | Freq: Every day | ORAL | 3 refills | Status: DC

## 2021-11-19 NOTE — Progress Notes (Signed)
Follow-up Outpatient Visit Date: 11/19/2021  Primary Care Provider: Remi Haggard, [place] [address]  Chief Complaint: Follow-up CAD, aortic/valvular heart disease, SVT, and hypertension  HPI:  Rodney Wiley is a 85 y.o. male with history of resistant hypertension, coronary artery disease status post CABG (3/19) aortic regurgitation and thoracic aortic aneurysm status post Bentall with bioprosthetic aortic valve replacement (3/19), SVT and frequent PACs, HFpEF, and chronic kidney disease stage 3 with questionable left renal artery stenosis, who presents for follow-up of coronary artery disease, valvular heart disease, and hypertension.  He was started on amiodarone earlier this year due to frequent PACs.  Due to QT PR prolongation, amiodarone was decreased to 100 mg daily.  At his last visit with Cadence Furth, [place], in late September, he was doing well.  Blood pressure was well controlled at the time.  No medication changes or additional testing were pursued.  Today, Rodney Wiley feels well. He has sporadic leg edema that is well controlled with as needed torsemide (he uses it about 3x/week on average).  He denies chest pain, shortness of breath, palpitations, and lightheadedness.  BP is mildly elevated at times but typically is in the normal range.  --------------------------------------------------------------------------------------------------  Cardiovascular History & Procedures: Cardiovascular Problems: Thoracic aortic aneurysm s/p Bentall Aortic regurgitation s/p bioprosthetic AVR Mitral regurgitation Questionable renal artery stenosis Coronary artery disease s/p CABG Supraventricular tachycardia Postoperative atrial fibrillation   Risk Factors: Known CAD, hypertension, hyperlipidemia, male gender, and age > 59   Cath/PCI: Coronary angiography/RHC (01/16/2018): LMCA normal.  LAD with sequential 20% ostial, 40% proximal, and long 70% proximal to mid  stenoses.  LCx with 60% proximal stenosis and 60 to 80-90% OM2 and OM3 lesions.  Chronic total occlusion of the proximal/mid RCA with left-to-right collaterals.  RA 9, RV 43/10, PA 37/15 (22), and PCWP 18.  Fick CO/CI 5.1/2.6.   CV Surgery: CABG and Bentall with bioprosthetic AVR (02/22/2018, Dr. Roxy Manns): LIMA to LAD, SVG to OM2, and SVG to PDA.  24 mm synthetic aortic root graft and 21 mm Edwards Inspiris [place] stented bovine pericardial tissue valve.   EP Procedures and Devices: Event monitor (01/22/19): Patient was monitored for 2 days, 22 hours.  Predominantly sinus rhythm with rare PAC's and frequent PVC's.  Several brief atrial runs were noted.  PVC burden 8.4%.   Non-Invasive Evaluation(s): TTE (03/17/2021): Normal LV size with mild LVH.  LVEF 60-65%.  Indeterminate diastolic function.  Normal RV size and function.  Normal PA pressure.  Normal biatrial size.  Mild-moderate MR.  Bioprosthetic aortic valve with mean gradient 21 mmHg and AVA 1.0 cm.  Normal CVP. Renal artery Doppler (02/06/19): No evidence of significant renal artery stenosis affecting either kidney. TTE (04/07/18): Normal LV size with mild LVH.  LVEF 50 to 55% with anteroseptal and septal hypokinesis.  Grade 2 diastolic dysfunction.  Bioprosthetic aortic valve present with mean gradient of 17 mmHg.  Mild mitral regurgitation.  Normal RV size and function.  No pericardial effusion. TEE (09/27/17): Mildly dilated LV with LVEF 60-65%. Moderate aortic regurgitation. Mild to moderate mitral regurgitation. Mild right atrial enlargement. Moderate aortic dilation, with root measuring 5.0 cm and ascending aorta 4.7 cm MRA chest (06/06/17): Stable ascending aortic aneurysm, measuring 5.0 cm at the sinotubular junction and 4.8 cm at the level of the main pulmonary artery. Renal artery Doppler (01/13/17): Aortic atherosclerosis without stenosis or aneurysm. Normal right renal artery. Mild to moderate left renal artery stenosis (1-59%). Normal/symmetric  kidney size. MRA  chest (12/27/16): Aneurysmal disease of the ascending aorta, measuring 4.3 cm at the sinus of Valsalva and 4.9 cm above the sinotubular junction. TTE (12/14/16): Mildly dilated LV with mild LVH. Normal contraction with LVEF 55-60%. Grade 1 diastolic dysfunction. Mild to moderate aortic regurgitation. Dilated aorta, measuring up to 4.9 cm above the sinotubular junction. Mild to moderate mitral regurgitation. Mild left atrial enlargement. Normal RV size and function. Transthoracic echo (09/2016, [place]): Full report not available.  Reportedly showed LVH with LVEF 62% and diastolic dysfunction.  MR and moderate to severe AI present with sclerotic aortic valve. Carotid Doppler (10/201&): Full report not available.  Reportedly showed 1-39% stenosis bilaterally. Transthoracic echo (02/18/16, [facility]): Normal LV and RV size and function.  LVEF > 55%.  Moderate AI; mild MR and TR.  Recent CV Pertinent Labs: Lab Results  Component Value Date   CHOL 132 09/15/2018   HDL 33 (L) 09/15/2018   LDLCALC 56 09/15/2018   TRIG 214 (H) 09/15/2018   CHOLHDL 4.0 09/15/2018   CHOLHDL 3.8 03/11/2017   INR 2.9 05/24/2018   INR 7.53 (HH) 03/27/2018   K 4.2 03/20/2021   MG 2.1 02/27/2021   BUN 36 (H) 03/20/2021   CREATININE 2.63 (H) 03/20/2021    Past medical and surgical history were reviewed and updated in EPIC.  Current Meds  Medication Sig   amiodarone (PACERONE) 100 MG tablet Take 1 tablet (100 mg total) by mouth daily.   amLODipine (NORVASC) 10 MG tablet TAKE 1 TABLET BY MOUTH EVERY DAY   ascorbic acid (VITAMIN C) 500 MG tablet Take 500 mg by mouth daily.   aspirin EC 81 MG tablet Take 81 mg by mouth daily.   atorvastatin (LIPITOR) 20 MG tablet Take 1 tablet (20 mg total) by mouth daily.   cholecalciferol (VITAMIN D3) 25 MCG (1000 UT) tablet Take 1,000 Units by mouth daily.   doxazosin (CARDURA) 4 MG tablet TAKE 1 TABLET BY MOUTH EVERY DAY   FARXIGA 10 MG TABS  tablet Take 1 tablet (10 mg total) by mouth daily.   ferrous sulfate 325 (65 FE) MG EC tablet Take 325 mg by mouth daily with breakfast.    hydrALAZINE (APRESOLINE) 100 MG tablet TAKE 1 TABLET BY MOUTH 3 TIMES DAILY.   isosorbide mononitrate (IMDUR) 60 MG 24 hr tablet Take 1 tablet (60 mg total) by mouth daily.   levothyroxine (SYNTHROID) 25 MCG tablet Take 25 mcg by mouth every morning.   nitroGLYCERIN (NITROSTAT) 0.4 MG SL tablet Place 1 tablet (0.4 mg total) under the tongue every 5 (five) minutes as needed for chest pain. Maximum of 3 doses.   omeprazole (PRILOSEC) 20 MG capsule Take 20 mg by mouth daily.   phenazopyridine (PYRIDIUM) 95 MG tablet Take 1 tablet (95 mg total) by mouth 3 (three) times daily as needed for pain.   torsemide (DEMADEX) 10 MG tablet Take 10 mg by mouth daily as needed.    Allergies: Patient has no known allergies.  Social History   Tobacco Use   Smoking status: Never   Smokeless tobacco: Never  Vaping Use   Vaping Use: Never used  Substance Use Topics   Alcohol use: No   Drug use: No    Family History  Problem Relation Age of Onset   Hypertension Mother    Stroke Mother    Leukemia Father    Heart attack Paternal Grandmother    Stroke Paternal Grandfather    Heart Problems Son 84   Heart  Problems Son 27   Uterine cancer Sister    Prostate cancer Brother    Colon cancer Brother    Kidney failure Brother    Stroke Sister     Review of Systems: A 12-system review of systems was performed and was negative except as noted in the HPI.  --------------------------------------------------------------------------------------------------  Physical Exam: BP 116/70 (BP Location: Left Arm, Patient Position: Sitting, Cuff Size: Normal)    Pulse (!) 54    Ht 5\' 7"  (1.702 m)    Wt 175 lb (79.4 kg)    SpO2 97%    BMI 27.41 kg/m   General:  NAD. Neck: No JVD or HJR. Lungs: Clear to auscultation bilaterally without wheezes or crackles. Heart: Bradycardic  but regular with 2/6 systolic murmur.  No rubs or gallops. Abdomen: Soft, nontender, nondistended. Extremities: Trace pretibial edema.  EKG:  Sinus bradycardia with 1st degree AV block, LAFB, and poor R wave progression.  Lab Results  Component Value Date   WBC 5.4 10/13/2021   HGB 11.0 (L) 10/13/2021   HCT 32.6 (L) 10/13/2021   MCV 95.6 10/13/2021   PLT 127 (L) 10/13/2021    Lab Results  Component Value Date   NA 136 03/20/2021   K 4.2 03/20/2021   CL 106 03/20/2021   CO2 15 (L) 03/20/2021   BUN 36 (H) 03/20/2021   CREATININE 2.63 (H) 03/20/2021   GLUCOSE 95 03/20/2021   ALT 21 03/04/2021    Lab Results  Component Value Date   CHOL 132 09/15/2018   HDL 33 (L) 09/15/2018   LDLCALC 56 09/15/2018   TRIG 214 (H) 09/15/2018   CHOLHDL 4.0 09/15/2018    --------------------------------------------------------------------------------------------------  ASSESSMENT AND PLAN: Chronic HFpEF: Volume status stable.  Continue current medications, including as needed torsemide.  Will check CMP and Mg level today.  Bradycardia and PSVT: Mild bradycardia with stable 1st degree AV block noted today.  Rodney Wiley is asymptomatic.  We will check a CMP, Mg level, and TSH today in the setting of continued amiodarone use.  Will also get a CXR for monitoring of amiodarone lung toxicity.  We will plan to continue amio 100 mg daily.  CAD with stable angina: No angina reported.  Continue secondary prevention and current antianginal therapy consisting of amlodipine and Imdur.  Check CBC, CMP, and lipid panel today.  Thoracic aortic aneurysm s/p Bentall: No symptoms of HF.  Continue current medications.  Hypertension: BP well-controlled.  Continue current medications.  Hyperlipidemia: Check CMP and lipid panel today.  Continue atorvastatin for goal LDL < 70.  Chronic kidney disease stage IV: Continue current medications.  Recheck CMP today.  Avoid nephrotoxic drugs.  Follow-up: Return  to clinic in 6 months.  Nelva Bush, MD 11/19/2021 8:34 AM

## 2021-11-19 NOTE — Patient Instructions (Addendum)
Medication Instructions:   Your physician recommends that you continue on your current medications as directed. Please refer to the Current Medication list given to you today.  *If you need a refill on your cardiac medications before your next appointment, please call your pharmacy*   Lab Work:  Today: CBC, CMET, Magnesium, TSH, Lipid panel  If you have labs (blood work) drawn today and your tests are completely normal, you will receive your results only by: [place] (if you have MyChart) OR A paper copy in the mail If you have any lab test that is abnormal or we need to change your treatment, we will call you to review the results.   Testing/Procedures:  Your provider has ordered a Chest X-ray to be done today.   -  Please go to the [facility] [place] will check in at the front desk to the right as you walk into the atrium.   A chest x-ray takes a picture of the organs and structures inside the chest, including the heart, lungs, and blood vessels. This test can show several things, including, whether the heart is enlarges; whether fluid is building up in the lungs; and whether pacemaker / defibrillator leads are still in place.   Follow-Up: At [facility], you and your health needs are our priority.  As part of our continuing mission to provide you with exceptional heart care, we have created designated Provider Care Teams.  These Care Teams include your primary Cardiologist (physician) and Advanced Practice Providers (APPs -  Physician Assistants and Nurse Practitioners) who all work together to provide you with the care you need, when you need it.  We recommend signing up for the patient portal called "MyChart".  Sign up information is provided on this After Visit Summary.  MyChart is used to connect with patients for Virtual Visits (Telemedicine).  Patients are able to view lab/test results, encounter notes, upcoming appointments, etc.  Non-urgent messages can be  sent to your provider as well.   To learn more about what you can do with MyChart, go to [email or web address].    Your next appointment:   6 month(s)  The format for your next appointment:   In Person  Provider:   You may see Nelva Bush, MD or one of the following Advanced Practice Providers on your designated Care Team:   Murray Hodgkins, NP Christell Faith, PA-C Cadence Kathlen Mody, [place]

## 2021-11-20 LAB — COMPREHENSIVE METABOLIC PANEL
ALT: 13 IU/L (ref 0–44)
AST: 17 IU/L (ref 0–40)
Albumin/Globulin Ratio: 1.5 (ref 1.2–2.2)
Albumin: 3.7 g/dL (ref 3.6–4.6)
Alkaline Phosphatase: 76 IU/L (ref 44–121)
BUN/Creatinine Ratio: 13 (ref 10–24)
BUN: 33 mg/dL — ABNORMAL HIGH (ref 8–27)
Bilirubin Total: 0.3 mg/dL (ref 0.0–1.2)
CO2: 17 mmol/L — ABNORMAL LOW (ref 20–29)
Calcium: 8 mg/dL — ABNORMAL LOW (ref 8.6–10.2)
Chloride: 104 mmol/L (ref 96–106)
Creatinine, Ser: 2.46 mg/dL — ABNORMAL HIGH (ref 0.76–1.27)
Globulin, Total: 2.4 g/dL (ref 1.5–4.5)
Glucose: 112 mg/dL — ABNORMAL HIGH (ref 70–99)
Potassium: 3.8 mmol/L (ref 3.5–5.2)
Sodium: 136 mmol/L (ref 134–144)
Total Protein: 6.1 g/dL (ref 6.0–8.5)
eGFR: 25 mL/min/{1.73_m2} — ABNORMAL LOW (ref >59)

## 2021-11-20 LAB — CBC
Hematocrit: 33 % — ABNORMAL LOW (ref 37.5–51.0)
Hemoglobin: 11 g/dL — ABNORMAL LOW (ref 13.0–17.7)
MCH: 31.3 pg (ref 26.6–33.0)
MCHC: 33.3 g/dL (ref 31.5–35.7)
MCV: 94 fL (ref 79–97)
Platelets: 162 10*3/uL (ref 150–450)
RBC: 3.52 x10E6/uL — ABNORMAL LOW (ref 4.14–5.80)
RDW: 13.2 % (ref 11.6–15.4)
WBC: 5 10*3/uL (ref 3.4–10.8)

## 2021-11-20 LAB — LIPID PANEL
Chol/HDL Ratio: 3.8 ratio (ref 0.0–5.0)
Cholesterol, Total: 143 mg/dL (ref 100–199)
HDL: 38 mg/dL — ABNORMAL LOW (ref >39)
LDL Chol Calc (NIH): 88 mg/dL (ref 0–99)
Triglycerides: 89 mg/dL (ref 0–149)
VLDL Cholesterol Cal: 17 mg/dL (ref 5–40)

## 2021-11-20 LAB — TSH: TSH: 6.42 u[IU]/mL — ABNORMAL HIGH (ref 0.450–4.500)

## 2021-11-20 LAB — MAGNESIUM: Magnesium: 1.9 mg/dL (ref 1.6–2.3)

## 2021-11-23 ENCOUNTER — Other Ambulatory Visit: Payer: Self-pay | Admitting: Internal Medicine

## 2021-11-23 ENCOUNTER — Telehealth: Payer: Self-pay | Admitting: Emergency Medicine

## 2021-11-23 DIAGNOSIS — D631 Anemia in chronic kidney disease: Secondary | ICD-10-CM | POA: Diagnosis not present

## 2021-11-23 DIAGNOSIS — N184 Chronic kidney disease, stage 4 (severe): Secondary | ICD-10-CM | POA: Diagnosis not present

## 2021-11-23 DIAGNOSIS — M899 Disorder of bone, unspecified: Secondary | ICD-10-CM | POA: Diagnosis not present

## 2021-11-23 DIAGNOSIS — N189 Chronic kidney disease, unspecified: Secondary | ICD-10-CM | POA: Diagnosis not present

## 2021-11-23 DIAGNOSIS — I10 Essential (primary) hypertension: Secondary | ICD-10-CM | POA: Diagnosis not present

## 2021-11-23 DIAGNOSIS — E785 Hyperlipidemia, unspecified: Secondary | ICD-10-CM

## 2021-11-23 MED ORDER — ATORVASTATIN CALCIUM 40 MG PO TABS: 40.0000 mg | ORAL_TABLET | Freq: Every day | ORAL | 3 refills | Status: DC

## 2021-11-23 NOTE — Telephone Encounter (Signed)
-----   Message from Rodney Bush, MD sent at 11/23/2021  7:36 AM EST ----- Please let Rodney Wiley know that his blood counts, kidney function, and electrolytes are stable.  His LDL (bad cholesterol) has risen since last check in our office in 2019 and is now above goal.  I suggest that Rodney Wiley increase his atorvastatin to 40 mg daily and that we repeat a lipid panel and ALT in about 3 months.  His TSH (thyroid function test) was also mildly elevated, suggesting that he may need an increase in his dose of levothyroxine.  He should discuss this with his PCP; please forward these results to his PCP for review, as well.  Thanks.

## 2021-11-23 NOTE — Telephone Encounter (Signed)
Called and spoke with pt about lab results. Informed pt that blood counts, kidney function and electrolytes are stable, however LDL is higher than last check and above goal.   Told patient that per Dr. Saunders Revel we have called in 40 mg atorvastatin daily to his pharmacy.   Also told pt that his TSH is mildly elevated, however as a cardiology office, we don't manage that, so he should speak with his PCP and we would forward this results to them.   Pt verbalized understanding, and didn't have any questions or concerns. Voiced appreciation for call.

## 2021-12-02 ENCOUNTER — Other Ambulatory Visit: Payer: Self-pay

## 2021-12-02 DIAGNOSIS — R972 Elevated prostate specific antigen [PSA]: Secondary | ICD-10-CM

## 2021-12-14 DIAGNOSIS — R32 Unspecified urinary incontinence: Secondary | ICD-10-CM | POA: Diagnosis not present

## 2021-12-14 DIAGNOSIS — E663 Overweight: Secondary | ICD-10-CM | POA: Diagnosis not present

## 2021-12-14 DIAGNOSIS — Z6827 Body mass index (BMI) 27.0-27.9, adult: Secondary | ICD-10-CM | POA: Diagnosis not present

## 2021-12-14 DIAGNOSIS — K219 Gastro-esophageal reflux disease without esophagitis: Secondary | ICD-10-CM | POA: Diagnosis not present

## 2021-12-14 DIAGNOSIS — E785 Hyperlipidemia, unspecified: Secondary | ICD-10-CM | POA: Diagnosis not present

## 2021-12-14 DIAGNOSIS — I25119 Atherosclerotic heart disease of native coronary artery with unspecified angina pectoris: Secondary | ICD-10-CM | POA: Diagnosis not present

## 2021-12-14 DIAGNOSIS — E039 Hypothyroidism, unspecified: Secondary | ICD-10-CM | POA: Diagnosis not present

## 2021-12-14 DIAGNOSIS — I471 Supraventricular tachycardia: Secondary | ICD-10-CM | POA: Diagnosis not present

## 2021-12-14 DIAGNOSIS — I509 Heart failure, unspecified: Secondary | ICD-10-CM | POA: Diagnosis not present

## 2021-12-14 DIAGNOSIS — Z7722 Contact with and (suspected) exposure to environmental tobacco smoke (acute) (chronic): Secondary | ICD-10-CM | POA: Diagnosis not present

## 2021-12-14 DIAGNOSIS — N189 Chronic kidney disease, unspecified: Secondary | ICD-10-CM | POA: Diagnosis not present

## 2021-12-14 DIAGNOSIS — I130 Hypertensive heart and chronic kidney disease with heart failure and stage 1 through stage 4 chronic kidney disease, or unspecified chronic kidney disease: Secondary | ICD-10-CM | POA: Diagnosis not present

## 2021-12-21 ENCOUNTER — Other Ambulatory Visit: Payer: Self-pay

## 2021-12-21 ENCOUNTER — Other Ambulatory Visit: Payer: Medicare HMO

## 2021-12-21 DIAGNOSIS — R972 Elevated prostate specific antigen [PSA]: Secondary | ICD-10-CM

## 2021-12-22 LAB — PSA: Prostate Specific Ag, Serum: 12.2 ng/mL — ABNORMAL HIGH (ref 0.0–4.0)

## 2021-12-23 ENCOUNTER — Telehealth: Payer: Self-pay | Admitting: *Deleted

## 2021-12-23 NOTE — Telephone Encounter (Signed)
-----   Message from Abbie Sons, MD sent at 12/22/2021  8:20 PM EST ----- Repeat PSA remains elevated at 12.2.  Recommend office visit for repeat UA and DRE

## 2021-12-23 NOTE — Telephone Encounter (Signed)
Notified patient as instructed, patient pleased. Discussed follow-up appointments, patient agrees  

## 2021-12-25 DIAGNOSIS — D649 Anemia, unspecified: Secondary | ICD-10-CM | POA: Diagnosis not present

## 2021-12-25 DIAGNOSIS — N184 Chronic kidney disease, stage 4 (severe): Secondary | ICD-10-CM | POA: Diagnosis not present

## 2022-01-07 ENCOUNTER — Other Ambulatory Visit: Payer: Self-pay | Admitting: *Deleted

## 2022-01-07 DIAGNOSIS — D509 Iron deficiency anemia, unspecified: Secondary | ICD-10-CM

## 2022-01-15 ENCOUNTER — Inpatient Hospital Stay: Payer: Medicare HMO

## 2022-01-15 ENCOUNTER — Other Ambulatory Visit: Payer: Self-pay

## 2022-01-15 ENCOUNTER — Ambulatory Visit: Payer: Medicare HMO | Admitting: Urology

## 2022-01-15 ENCOUNTER — Encounter: Payer: Self-pay | Admitting: Oncology

## 2022-01-15 ENCOUNTER — Inpatient Hospital Stay (HOSPITAL_BASED_OUTPATIENT_CLINIC_OR_DEPARTMENT_OTHER): Payer: Medicare HMO | Admitting: Oncology

## 2022-01-15 ENCOUNTER — Encounter: Payer: Self-pay | Admitting: Urology

## 2022-01-15 ENCOUNTER — Inpatient Hospital Stay: Payer: Medicare HMO | Attending: Oncology

## 2022-01-15 VITALS — BP 151/58 | HR 64 | Ht 67.0 in | Wt 172.0 lb

## 2022-01-15 VITALS — BP 131/77 | HR 56 | Temp 97.9°F | Resp 16 | Ht 67.0 in | Wt 176.0 lb

## 2022-01-15 DIAGNOSIS — D631 Anemia in chronic kidney disease: Secondary | ICD-10-CM | POA: Diagnosis not present

## 2022-01-15 DIAGNOSIS — D509 Iron deficiency anemia, unspecified: Secondary | ICD-10-CM

## 2022-01-15 DIAGNOSIS — N184 Chronic kidney disease, stage 4 (severe): Secondary | ICD-10-CM | POA: Insufficient documentation

## 2022-01-15 DIAGNOSIS — N400 Enlarged prostate without lower urinary tract symptoms: Secondary | ICD-10-CM | POA: Diagnosis not present

## 2022-01-15 DIAGNOSIS — Z79899 Other long term (current) drug therapy: Secondary | ICD-10-CM | POA: Diagnosis not present

## 2022-01-15 DIAGNOSIS — N189 Chronic kidney disease, unspecified: Secondary | ICD-10-CM

## 2022-01-15 DIAGNOSIS — R600 Localized edema: Secondary | ICD-10-CM | POA: Diagnosis not present

## 2022-01-15 LAB — IRON AND TIBC
Iron: 77 ug/dL (ref 45–182)
Saturation Ratios: 29 % (ref 17.9–39.5)
TIBC: 263 ug/dL (ref 250–450)
UIBC: 186 ug/dL

## 2022-01-15 LAB — CBC WITH DIFFERENTIAL/PLATELET
Abs Immature Granulocytes: 0.02 10*3/uL (ref 0.00–0.07)
Basophils Absolute: 0 10*3/uL (ref 0.0–0.1)
Basophils Relative: 1 %
Eosinophils Absolute: 0.3 10*3/uL (ref 0.0–0.5)
Eosinophils Relative: 5 %
HCT: 30.2 % — ABNORMAL LOW (ref 39.0–52.0)
Hemoglobin: 10.1 g/dL — ABNORMAL LOW (ref 13.0–17.0)
Immature Granulocytes: 0 %
Lymphocytes Relative: 16 %
Lymphs Abs: 0.9 10*3/uL (ref 0.7–4.0)
MCH: 32.6 pg (ref 26.0–34.0)
MCHC: 33.4 g/dL (ref 30.0–36.0)
MCV: 97.4 fL (ref 80.0–100.0)
Monocytes Absolute: 0.6 10*3/uL (ref 0.1–1.0)
Monocytes Relative: 11 %
Neutro Abs: 3.7 10*3/uL (ref 1.7–7.7)
Neutrophils Relative %: 67 %
Platelets: 165 10*3/uL (ref 150–400)
RBC: 3.1 MIL/uL — ABNORMAL LOW (ref 4.22–5.81)
RDW: 13.3 % (ref 11.5–15.5)
WBC: 5.5 10*3/uL (ref 4.0–10.5)
nRBC: 0 % (ref 0.0–0.2)

## 2022-01-15 LAB — COMPREHENSIVE METABOLIC PANEL
ALT: 16 U/L (ref 0–44)
AST: 23 U/L (ref 15–41)
Albumin: 3.8 g/dL (ref 3.5–5.0)
Alkaline Phosphatase: 53 U/L (ref 38–126)
Anion gap: 8 (ref 5–15)
BUN: 48 mg/dL — ABNORMAL HIGH (ref 8–23)
CO2: 24 mmol/L (ref 22–32)
Calcium: 8.3 mg/dL — ABNORMAL LOW (ref 8.9–10.3)
Chloride: 103 mmol/L (ref 98–111)
Creatinine, Ser: 2.73 mg/dL — ABNORMAL HIGH (ref 0.61–1.24)
GFR, Estimated: 22 mL/min — ABNORMAL LOW (ref >60)
Glucose, Bld: 110 mg/dL — ABNORMAL HIGH (ref 70–99)
Potassium: 4 mmol/L (ref 3.5–5.1)
Sodium: 135 mmol/L (ref 135–145)
Total Bilirubin: 0.7 mg/dL (ref 0.3–1.2)
Total Protein: 7.1 g/dL (ref 6.5–8.1)

## 2022-01-15 LAB — MICROSCOPIC EXAMINATION
Bacteria, UA: NONE SEEN
Epithelial Cells (non renal): NONE SEEN /hpf (ref 0–10)

## 2022-01-15 LAB — URINALYSIS, COMPLETE
Bilirubin, UA: NEGATIVE
Glucose, UA: NEGATIVE
Ketones, UA: NEGATIVE
Leukocytes,UA: NEGATIVE
Nitrite, UA: NEGATIVE
RBC, UA: NEGATIVE
Specific Gravity, UA: 1.02 (ref 1.005–1.030)
Urobilinogen, Ur: 0.2 mg/dL (ref 0.2–1.0)
pH, UA: 5.5 (ref 5.0–7.5)

## 2022-01-15 LAB — FERRITIN: Ferritin: 176 ng/mL (ref 24–336)

## 2022-01-15 NOTE — Progress Notes (Signed)
Pt states that he feels ok no concerns

## 2022-01-15 NOTE — Progress Notes (Signed)
01/15/2022 9:32 AM   Rachelle Hora Chap 09/09/1936 [id number]  Referring provider: Remi Haggard, [address] [place],  [place] [address]  Chief Complaint  Patient presents with   Other    HPI: 86 y.o. male presents for follow-up visit.  Initially seen by me 09/2020 for a PSA in the low 7 range Hospitalized for UTI October 2021 and PSA was 23.6 in April 2022 Repeat follow-up PSA recommended which was repeated July 2022 and was 12.6 Repeat PSA performed in January 2023 stable at 12.2 but still elevated above baseline Appointment was made for repeat UA and DRE No complaints.  Denies dysuria, gross hematuria or flank, abdominal, pelvic pain   PMH: Past Medical History:  Diagnosis Date   Anemia    Aortic insufficiency    a. 02/2018 s/p bioprosthetic AVR; 04/2018 Echo: EF 50-55%, Gr2 DD, Ao bioprosthesis, mean grad 62mmHg, Ao root/Asc Ao nl in size, mild MR, mildly dil LA, nl RV fxn. Nl PASP.   Arthropathy    Ascending aortic aneurysm    a. 12/2016 MRA: 4.3cm @ sinus of valsalva, 4.9cm above sinotubular jxn; b. 02/2018 s/p biological Bentall and AVR.   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 02/2018 Cath: LAD 20ost, 40p, 82m, LCX 60p, OM2 60, OM3 85, RCA 10p/m w/ L->R and R->R collats;  b. 02/2018 CABG x 3: LIMA to LAD, SVG to OM2, SVG to PDA, EVH via right thigh.   CKD (chronic kidney disease), stage III (HCC)    Diverticulitis    Essential hypertension    GERD (gastroesophageal reflux disease)    Hemorrhoids    History of chicken pox    History of Helicobacter pylori infection 06/2007   Hyperlipidemia    Hypertension    Left renal artery stenosis (HCC)    a. 01/2017 RA u/s: moderate L RAS.   Lower extremity edema    Nonrheumatic mitral (valve) insufficiency    a. 12/2016 Echo: mild to mod MR; b. 09/2017 TEE: mild to mod MR; c. 04/2018 Echo: Mild MR.   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 s/p 21 mm Edwards Inspiris Resilia stented bovine pericardial tissue valve and 24 mm Gelweave Valsalva synthetic root conduit with reimplantation of left main coronary artery.    Surgical History: Past Surgical History:  Procedure Laterality Date   AORTIC VALVE REPAIR N/A 02/22/2018   Procedure: AORTIC VALVE REPLACEMENT -Biological Bentall aortic root replacement,;  Surgeon: Rexene Alberts, MD;  Location: [place];  Service: Open Heart Surgery;  Laterality: N/A;   bypass surgery     CARDIAC CATHETERIZATION     01/16/2018   COLONOSCOPY  2011   by Dr. Candace Cruise with findings of diverticulosis   CORONARY ARTERY BYPASS GRAFT N/A 02/22/2018   Procedure: CORONARY ARTERY BYPASS GRAFTING (CABG) x three, using left internal mammary artery and right leg greater saphenous vein harvested endoscopically;  Surgeon: Rexene Alberts, MD;  Location: [place];  Service: Open Heart Surgery;  Laterality: N/A;   CORONARY/GRAFT ANGIOGRAPHY N/A 01/16/2018   Procedure: CORONARY/GRAFT ANGIOGRAPHY;  Surgeon: Nelva Bush, MD;  Location: [place] CV LAB;  Service: Cardiovascular;  Laterality: N/A;   ELBOW SURGERY Left    hemorhoidectomy     RIGHT HEART CATH N/A 01/16/2018   Procedure: RIGHT HEART CATH;  Surgeon: Nelva Bush, MD;  Location: [facility]  INVASIVE CV LAB;  Service: Cardiovascular;  Laterality: N/A;   SHOULDER ARTHROSCOPY WITH ROTATOR CUFF REPAIR Right    SHOULDER SURGERY Right    TEE WITHOUT CARDIOVERSION N/A 09/27/2017   Procedure: TRANSESOPHAGEAL ECHOCARDIOGRAM (TEE);  Surgeon: Dorothy Spark, MD;  Location: [facility] ENDOSCOPY;  Service: Cardiovascular;  Laterality: N/A;   TEE WITHOUT CARDIOVERSION N/A 02/22/2018   Procedure: TRANSESOPHAGEAL ECHOCARDIOGRAM (TEE);  Surgeon: Rexene Alberts, MD;  Location: [place];  Service: Open Heart Surgery;  Laterality: N/A;   THORACIC AORTIC ANEURYSM REPAIR N/A 02/22/2018   Procedure: THORACIC  ASCENDING ANEURYSM REPAIR (AAA) Resection of ascending aorta aneurysm;  Surgeon: Rexene Alberts, MD;  Location: [facility] OR;  Service: Open Heart Surgery;  Laterality: N/A;    Home Medications:  Allergies as of 01/15/2022   No Known Allergies      Medication List        Accurate as of January 15, 2022  9:32 AM. If you have any questions, ask your nurse or doctor.          amiodarone 100 MG tablet Commonly known as: PACERONE Take 1 tablet (100 mg total) by mouth daily.   amLODipine 10 MG tablet Commonly known as: NORVASC TAKE 1 TABLET BY MOUTH EVERY DAY   ascorbic acid 500 MG tablet Commonly known as: VITAMIN C Take 500 mg by mouth daily.   aspirin EC 81 MG tablet Take 81 mg by mouth daily.   atorvastatin 40 MG tablet Commonly known as: LIPITOR Take 1 tablet (40 mg total) by mouth daily.   cholecalciferol 25 MCG (1000 UNIT) tablet Commonly known as: VITAMIN D3 Take 1,000 Units by mouth daily.   doxazosin 4 MG tablet Commonly known as: CARDURA TAKE 1 TABLET BY MOUTH EVERY DAY   Farxiga 10 MG Tabs tablet Generic drug: dapagliflozin propanediol Take 1 tablet (10 mg total) by mouth daily.   ferrous sulfate 325 (65 FE) MG EC tablet Take 325 mg by mouth daily with breakfast.   hydrALAZINE 100 MG tablet Commonly known as: APRESOLINE TAKE 1 TABLET BY MOUTH 3 TIMES DAILY.   isosorbide mononitrate 60 MG 24 hr tablet Commonly known as: IMDUR Take 1 tablet (60 mg total) by mouth daily.   levothyroxine 25 MCG tablet Commonly known as: SYNTHROID Take 25 mcg by mouth every morning.   nitroGLYCERIN 0.4 MG SL tablet Commonly known as: Nitrostat Place 1 tablet (0.4 mg total) under the tongue every 5 (five) minutes as needed for chest pain. Maximum of 3 doses.   omeprazole 20 MG capsule Commonly known as: PRILOSEC Take 20 mg by mouth daily.   phenazopyridine 95 MG tablet Commonly known as: PYRIDIUM Take 1 tablet (95 mg total) by mouth 3 (three) times daily as needed  for pain.   torsemide 10 MG tablet Commonly known as: DEMADEX Take 1 tablet (10 mg total) by mouth daily as needed.        Allergies: No Known Allergies  Family History: Family History  Problem Relation Age of Onset   Hypertension Mother    Stroke Mother    Leukemia Father    Heart attack Paternal Grandmother    Stroke Paternal Grandfather    Heart Problems Son 59   Heart Problems Son 30   Uterine cancer Sister    Prostate cancer Brother    Colon cancer Brother    Kidney failure Brother    Stroke Sister     Social History:  reports that he has never smoked. He has never  used smokeless tobacco. He reports that he does not drink alcohol and does not use drugs.   Physical Exam: BP (!) 151/58    Pulse 64    Ht 5\' 7"  (1.702 m)    Wt 172 lb (78 kg)    BMI 26.94 kg/m   Constitutional:  Alert and oriented, No acute distress. GU: Prostate 60+ cc, smooth without nodules Psychiatric: Normal mood and affect.   Assessment & Plan:    1.  Elevated PSA Last 2 PSAs elevated above baseline but stable We discussed the incidence of low-grade prostate cancer in men who are in their 80s at ~ 50% and that low-grade prostate cancer is not treated Urinalysis today was clear Options were discussed of continued surveillance versus prostate biopsy He has elected continued surveillance and will schedule a follow-up lab visit 6 months for PSA and office visit 12 months for PSA/DRE   Abbie Sons, MD  [place] [address] [place], [place] [address] [phone]

## 2022-01-17 ENCOUNTER — Encounter: Payer: Self-pay | Admitting: Oncology

## 2022-01-17 NOTE — Progress Notes (Signed)
Hematology/Oncology Consult note [facility]  [phone] [phone]  Patient Care Team: Remi Haggard, FNP as PCP - General (Family Medicine) End, Harrell Gave, MD as PCP - Cardiology (Cardiology)   Name of the patient: Rodney Wiley  [id number]  10-16-1936   Date of visit: 01/17/22  Diagnosis- anemia likely secondary to CKD and iron deficiency Thrombocytopenia    Chief complaint/ Reason for visit-routine follow-up of anemia  Heme/Onc history: patient is a 86 year old male with a past medical history significant for hypertension, hyperlipidemia, stage III CKD and history of aortic insufficiency as well as ascending aortic aneurysm.  He recently underwent bioprosthetic aortic valve replacement, CABG x3 and resuspension of the left atrium on 02/22/2018.    Results of blood work in June 2021 were consistent with iron deficiency along with CKD. He received venofer in July 2021.   Interval history-patient is doing well overall.  He hasMild chronic fatigue but denies any new complaints at this time.  He has occasional leg edema which is well controlled with torsemide.  ECOG PS- 1 Pain scale- 0   Review of systems- Review of Systems  Constitutional:  Positive for malaise/fatigue. Negative for chills, fever and weight loss.  HENT:  Negative for congestion, ear discharge and nosebleeds.   Eyes:  Negative for blurred vision.  Respiratory:  Negative for cough, hemoptysis, sputum production, shortness of breath and wheezing.   Cardiovascular:  Negative for chest pain, palpitations, orthopnea and claudication.  Gastrointestinal:  Negative for abdominal pain, blood in stool, constipation, diarrhea, heartburn, melena, nausea and vomiting.  Genitourinary:  Negative for dysuria, flank pain, frequency, hematuria and urgency.  Musculoskeletal:  Negative for back pain, joint pain and myalgias.  Skin:  Negative for rash.  Neurological:  Negative for  dizziness, tingling, focal weakness, seizures, weakness and headaches.  Endo/Heme/Allergies:  Does not bruise/bleed easily.  Psychiatric/Behavioral:  Negative for depression and suicidal ideas. The patient does not have insomnia.       No Known Allergies   Past Medical History:  Diagnosis Date   Anemia    Aortic insufficiency    a. 02/2018 s/p bioprosthetic AVR; 04/2018 Echo: EF 50-55%, Gr2 DD, Ao bioprosthesis, mean grad 30mmHg, Ao root/Asc Ao nl in size, mild MR, mildly dil LA, nl RV fxn. Nl PASP.   Arthropathy    Ascending aortic aneurysm    a. 12/2016 MRA: 4.3cm @ sinus of valsalva, 4.9cm above sinotubular jxn; b. 02/2018 s/p biological Bentall and AVR.   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 02/2018 Cath: LAD 20ost, 40p, 32m, LCX 60p, OM2 60, OM3 85, RCA 10p/m w/ L->R and R->R collats;  b. 02/2018 CABG x 3: LIMA to LAD, SVG to OM2, SVG to PDA, EVH via right thigh.   CKD (chronic kidney disease), stage III (HCC)    Diverticulitis    Essential hypertension    GERD (gastroesophageal reflux disease)    Hemorrhoids    History of chicken pox    History of Helicobacter pylori infection 06/2007   Hyperlipidemia    Hypertension    Left renal artery stenosis (HCC)    a. 01/2017 RA u/s: moderate L RAS.   Lower extremity edema    Nonrheumatic mitral (valve) insufficiency    a. 12/2016 Echo: mild to mod MR; b. 09/2017 TEE: mild to mod MR; c. 04/2018 Echo: Mild MR.   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 s/p 21 mm Edwards Inspiris Resilia stented bovine pericardial tissue valve and 24 mm Gelweave Valsalva synthetic root conduit with reimplantation of left main coronary artery.     Past Surgical History:  Procedure Laterality Date   AORTIC VALVE REPAIR N/A 02/22/2018   Procedure: AORTIC VALVE REPLACEMENT -Biological Bentall aortic root  replacement,;  Surgeon: Rexene Alberts, MD;  Location: [place];  Service: Open Heart Surgery;  Laterality: N/A;   bypass surgery     CARDIAC CATHETERIZATION     01/16/2018   COLONOSCOPY  2011   by Dr. Candace Cruise with findings of diverticulosis   CORONARY ARTERY BYPASS GRAFT N/A 02/22/2018   Procedure: CORONARY ARTERY BYPASS GRAFTING (CABG) x three, using left internal mammary artery and right leg greater saphenous vein harvested endoscopically;  Surgeon: Rexene Alberts, MD;  Location: [place];  Service: Open Heart Surgery;  Laterality: N/A;   CORONARY/GRAFT ANGIOGRAPHY N/A 01/16/2018   Procedure: CORONARY/GRAFT ANGIOGRAPHY;  Surgeon: Nelva Bush, MD;  Location: [place] CV LAB;  Service: Cardiovascular;  Laterality: N/A;   ELBOW SURGERY Left    hemorhoidectomy     RIGHT HEART CATH N/A 01/16/2018   Procedure: RIGHT HEART CATH;  Surgeon: Nelva Bush, MD;  Location: [place] CV LAB;  Service: Cardiovascular;  Laterality: N/A;   SHOULDER ARTHROSCOPY WITH ROTATOR CUFF REPAIR Right    SHOULDER SURGERY Right    TEE WITHOUT CARDIOVERSION N/A 09/27/2017   Procedure: TRANSESOPHAGEAL ECHOCARDIOGRAM (TEE);  Surgeon: Dorothy Spark, MD;  Location: [facility] ENDOSCOPY;  Service: Cardiovascular;  Laterality: N/A;   TEE WITHOUT CARDIOVERSION N/A 02/22/2018   Procedure: TRANSESOPHAGEAL ECHOCARDIOGRAM (TEE);  Surgeon: Rexene Alberts, MD;  Location: [place];  Service: Open Heart Surgery;  Laterality: N/A;   THORACIC AORTIC ANEURYSM REPAIR N/A 02/22/2018   Procedure: THORACIC ASCENDING ANEURYSM REPAIR (AAA) Resection of ascending aorta aneurysm;  Surgeon: Rexene Alberts, MD;  Location: [facility] OR;  Service: Open Heart Surgery;  Laterality: N/A;    Social History   Socioeconomic History   Marital status: Widowed    Spouse name: Not on file   Number of children: Not on file   Years of education: Not on file   Highest education level: Not on file  Occupational History   Not on file  Tobacco Use   Smoking  status: Never   Smokeless tobacco: Never  Vaping Use   Vaping Use: Never used  Substance and Sexual Activity   Alcohol use: No   Drug use: No   Sexual activity: Not Currently  Other Topics Concern   Not on file  Social History Narrative   Not on file   Social Determinants of Health   Financial Resource Strain: Not on file  Food Insecurity: Not on file  Transportation Needs: Not on file  Physical Activity: Not on file  Stress: Not on file  Social Connections: Not on file  Intimate Partner Violence: Not on file    Family History  Problem Relation Age of Onset   Hypertension Mother    Stroke Mother    Leukemia Father    Heart attack Paternal Grandmother    Stroke Paternal Grandfather    Heart Problems Son 72   Heart Problems Son 48   Uterine cancer Sister    Prostate cancer Brother    Colon cancer Brother    Kidney failure Brother    Stroke Sister  Current Outpatient Medications:    amiodarone (PACERONE) 100 MG tablet, Take 1 tablet (100 mg total) by mouth daily., Disp: 60 tablet, Rfl: 5   amLODipine (NORVASC) 10 MG tablet, TAKE 1 TABLET BY MOUTH EVERY DAY, Disp: 90 tablet, Rfl: 3   ascorbic acid (VITAMIN C) 500 MG tablet, Take 500 mg by mouth daily., Disp: , Rfl:    aspirin EC 81 MG tablet, Take 81 mg by mouth daily., Disp: , Rfl:    atorvastatin (LIPITOR) 40 MG tablet, Take 1 tablet (40 mg total) by mouth daily., Disp: 90 tablet, Rfl: 3   cholecalciferol (VITAMIN D3) 25 MCG (1000 UT) tablet, Take 1,000 Units by mouth daily., Disp: , Rfl:    doxazosin (CARDURA) 4 MG tablet, TAKE 1 TABLET BY MOUTH EVERY DAY, Disp: 90 tablet, Rfl: 1   FARXIGA 10 MG TABS tablet, Take 1 tablet (10 mg total) by mouth daily., Disp: 90 tablet, Rfl: 3   ferrous sulfate 325 (65 FE) MG EC tablet, Take 325 mg by mouth daily with breakfast. , Disp: , Rfl:    hydrALAZINE (APRESOLINE) 100 MG tablet, TAKE 1 TABLET BY MOUTH 3 TIMES DAILY., Disp: 90 tablet, Rfl: 0   isosorbide mononitrate (IMDUR)  60 MG 24 hr tablet, Take 1 tablet (60 mg total) by mouth daily., Disp: 90 tablet, Rfl: 3   levothyroxine (SYNTHROID) 25 MCG tablet, Take 25 mcg by mouth every morning., Disp: , Rfl:    omeprazole (PRILOSEC) 20 MG capsule, Take 20 mg by mouth daily., Disp: , Rfl:    phenazopyridine (PYRIDIUM) 95 MG tablet, Take 1 tablet (95 mg total) by mouth 3 (three) times daily as needed for pain., Disp: 20 tablet, Rfl: 0   torsemide (DEMADEX) 10 MG tablet, Take 1 tablet (10 mg total) by mouth daily as needed., Disp: 90 tablet, Rfl: 0   nitroGLYCERIN (NITROSTAT) 0.4 MG SL tablet, Place 1 tablet (0.4 mg total) under the tongue every 5 (five) minutes as needed for chest pain. Maximum of 3 doses. (Patient not taking: Reported on 01/15/2022), Disp: 25 tablet, Rfl: 2  Physical exam:  Vitals:   01/15/22 1327  BP: 131/77  Pulse: (!) 56  Resp: 16  Temp: 97.9 F (36.6 C)  TempSrc: Oral  Weight: 176 lb (79.8 kg)  Height: 5\' 7"  (1.702 m)   Physical Exam Constitutional:      General: He is not in acute distress. Cardiovascular:     Rate and Rhythm: Normal rate and regular rhythm.     Heart sounds: Murmur heard.  Pulmonary:     Effort: Pulmonary effort is normal.     Breath sounds: Normal breath sounds.  Musculoskeletal:     Comments: Trace bilateral edema  Skin:    General: Skin is warm and dry.  Neurological:     Mental Status: He is alert and oriented to person, place, and time.     CMP Latest Ref Rng & Units 01/15/2022  Glucose 70 - 99 mg/dL 110(H)  BUN 8 - 23 mg/dL 48(H)  Creatinine 0.61 - 1.24 mg/dL 2.73(H)  Sodium 135 - 145 mmol/L 135  Potassium 3.5 - 5.1 mmol/L 4.0  Chloride 98 - 111 mmol/L 103  CO2 22 - 32 mmol/L 24  Calcium 8.9 - 10.3 mg/dL 8.3(L)  Total Protein 6.5 - 8.1 g/dL 7.1  Total Bilirubin 0.3 - 1.2 mg/dL 0.7  Alkaline Phos 38 - 126 U/L 53  AST 15 - 41 U/L 23  ALT 0 - 44 U/L 16  CBC Latest Ref Rng & Units 01/15/2022  WBC 4.0 - 10.5 K/uL 5.5  Hemoglobin 13.0 - 17.0 g/dL  10.1(L)  Hematocrit 39.0 - 52.0 % 30.2(L)  Platelets 150 - 400 K/uL 165     Assessment and plan- Patient is a 86 y.o. male with history of iron deficiency anemia and anemia of chronic kidney disease here for routine follow-up  Patient's hemoglobin has remained stable between 10-11 for the last 1 year.  He has not required any Retacrit so far.  He is ferritin levels are 176 today with an iron saturation of 29% and therefore he does not require any IV iron at this time.  It has been more than a year since patient has required IV iron.  Given the stability of his counts I will do a CBC CMP ferritin and iron studies in 6 months in 1 year and see him back in a year.    Visit Diagnosis 1. Iron deficiency anemia, unspecified iron deficiency anemia type   2. Chronic kidney disease, stage IV (severe) ([place])   3. Anemia of chronic kidney failure, stage 4 (severe) (HCC)      Dr. Randa Evens, MD, MPH [facility] [phone] 01/17/2022 10:56 AM

## 2022-01-26 ENCOUNTER — Telehealth: Payer: Self-pay | Admitting: Pharmacy Technician

## 2022-01-26 DIAGNOSIS — Z596 Low income: Secondary | ICD-10-CM

## 2022-01-26 NOTE — Progress Notes (Signed)
[facility])                                            [place] Team    01/26/2022  [place] 21-May-1936 [id number]                                      Medication Assistance Referral  Referral From: [place]  Medication/Company: [employer] / [place] Patient application portion:  Mailed Provider application portion: Faxed  to Dr. Harrell Gave End Provider address/fax verified via: Office website    Capitola Ladson P. Citlally Captain, [place]  [phone]

## 2022-02-09 ENCOUNTER — Other Ambulatory Visit: Payer: Medicare HMO

## 2022-02-09 ENCOUNTER — Other Ambulatory Visit
Admission: RE | Admit: 2022-02-09 | Discharge: 2022-02-09 | Disposition: A | Discharge status: Home / Self Care | Payer: Medicare HMO | Source: Ambulatory Visit | Attending: Oncology | Admitting: Oncology

## 2022-02-09 DIAGNOSIS — D509 Iron deficiency anemia, unspecified: Secondary | ICD-10-CM | POA: Insufficient documentation

## 2022-02-09 LAB — COMPREHENSIVE METABOLIC PANEL
ALT: 15 U/L (ref 0–44)
AST: 20 U/L (ref 15–41)
Albumin: 3.7 g/dL (ref 3.5–5.0)
Alkaline Phosphatase: 53 U/L (ref 38–126)
Anion gap: 10 (ref 5–15)
BUN: 62 mg/dL — ABNORMAL HIGH (ref 8–23)
CO2: 22 mmol/L (ref 22–32)
Calcium: 8.6 mg/dL — ABNORMAL LOW (ref 8.9–10.3)
Chloride: 109 mmol/L (ref 98–111)
Creatinine, Ser: 2.94 mg/dL — ABNORMAL HIGH (ref 0.61–1.24)
GFR, Estimated: 20 mL/min — ABNORMAL LOW (ref >60)
Glucose, Bld: 130 mg/dL — ABNORMAL HIGH (ref 70–99)
Potassium: 3.8 mmol/L (ref 3.5–5.1)
Sodium: 141 mmol/L (ref 135–145)
Total Bilirubin: 0.7 mg/dL (ref 0.3–1.2)
Total Protein: 6.9 g/dL (ref 6.5–8.1)

## 2022-02-09 LAB — CBC
HCT: 29.1 % — ABNORMAL LOW (ref 39.0–52.0)
Hemoglobin: 9.6 g/dL — ABNORMAL LOW (ref 13.0–17.0)
MCH: 32.2 pg (ref 26.0–34.0)
MCHC: 33 g/dL (ref 30.0–36.0)
MCV: 97.7 fL (ref 80.0–100.0)
Platelets: 147 10*3/uL — ABNORMAL LOW (ref 150–400)
RBC: 2.98 MIL/uL — ABNORMAL LOW (ref 4.22–5.81)
RDW: 13.5 % (ref 11.5–15.5)
WBC: 5.6 10*3/uL (ref 4.0–10.5)
nRBC: 0 % (ref 0.0–0.2)

## 2022-02-09 LAB — IRON AND TIBC
Iron: 62 ug/dL (ref 45–182)
Saturation Ratios: 23 % (ref 17.9–39.5)
TIBC: 273 ug/dL (ref 250–450)
UIBC: 211 ug/dL

## 2022-02-09 LAB — FERRITIN: Ferritin: 127 ng/mL (ref 24–336)

## 2022-02-10 ENCOUNTER — Telehealth: Payer: Self-pay | Admitting: *Deleted

## 2022-02-10 NOTE — Telephone Encounter (Signed)
Patient daughter in law called stating that patient was called to come in for additional lab work and was not informed why or about results, she requests a return call to explain why he had this add on lab visit and go over the abnormal results ? ?CBC ?Order: [id number] ?Status: Final result    ?Visible to patient: Yes (seen)    ?Next appt: 05/21/2022 at 09:00 AM in Cardiology Harrell Gave End, MD)    ?Dx: Iron deficiency anemia, unspecified i...    ?0 Result Notes ?          ?Component Ref Range & Units 1 d ago ?(02/09/22) 3 wk ago ?(01/15/22) 3 wk ago ?(01/15/22) 2 mo ago ?(11/19/21) 4 mo ago ?(10/13/21) 7 mo ago ?(07/13/21) 10 mo ago ?(03/26/21)  ?WBC 4.0 - 10.5 K/uL 5.6  5.5   5.0 R  5.4  5.2  4.7   ?RBC 4.22 - 5.81 MIL/uL 2.98 Low   3.10 Low   0-2 R  3.52 Low  R  3.41 Low   3.13 Low   2.92 Low    ?Hemoglobin 13.0 - 17.0 g/dL 9.6 Low   10.1 Low    11.0 Low  R  11.0 Low   10.1 Low   9.5 Low    ?HCT 39.0 - 52.0 % 29.1 Low   30.2 Low    33.0 Low  R  32.6 Low   30.7 Low   28.2 Low    ?MCV 80.0 - 100.0 fL 97.7  97.4   94 R  95.6  98.1  96.6   ?MCH 26.0 - 34.0 pg 32.2  32.6   31.3 R  32.3  32.3  32.5   ?MCHC 30.0 - 36.0 g/dL 33.0  33.4   33.3 R  33.7  32.9  33.7   ?RDW 11.5 - 15.5 % 13.5  13.3   13.2 R  13.4  13.9  14.2   ?Platelets 150 - 400 K/uL 147 Low   165   162 R  127 Low   124 Low   109 Low    ?nRBC 0.0 - 0.2 % 0.0  0.0    0.0  0.0  0.0   ?Comment: Performed at [facility], [address], [place], [place] [address]  ?WBC, UA    0-5 R       ?Eosinophils Relative   5 R    6 R  3 R  4 R   ?Eosinophils Absolute   0.3 R    0.3 R  0.2 R  0.2 R   ?Basophils Relative   1 R    0 R  0 R  0 R   ?Basophils Absolute   0.0 R    0.0 R  0.0 R  0.0 R   ?Immature Granulocytes   0 R    0 R  0 R  0 R   ?Abs Immature Granulocytes   0.02 R, CM    0.02 R, CM  0.02 R, CM  0.02 R, CM   ?Epithelial Cells (non renal)    None seen R       ?Bacteria, UA    None seen R       ?Neutrophils Relative %   67 R    68 R  70 R  70 R   ?Neutro  Abs   3.7 R    3.7 R  3.6 R  3.2 R   ?Lymphocytes Relative   16 R  OkreekLymphs Abs   0.9 R    0.9 R  0.9 R  0.8 R   ?Monocytes Relative   11 R    10 R  9 R  10 R   ?Monocytes Absolute   0.6 R    0.5 R  0.5 R  0.5 R   ?Resulting Agency  [facility] [place] [facility] [place] [facility] [place] [facility] [place] [facility]  ?  ? ?  ?  ?Specimen Collected: 02/09/22 08:47 Last Resulted: 02/09/22 08:56  ?  ?  Lab Flowsheet   ? Order Details   ? View Encounter   ? Lab and Collection Details   ? Routing   ? Result History    ?View Encounter Conversation    ?  ?CM=Additional comments  R=Reference range differs from displayed range    ?  ?Result Care Coordination ? ? ?Patient Communication ? ? Add Comments   Seen Back to Top  ?  ?  ? ?Other Results from 02/09/2022 ? ? Contains abnormal data Comprehensive metabolic panel ?Order: [id number] ?Status: Final result    ?Visible to patient: Yes (seen)    ?Next appt: 05/21/2022 at 09:00 AM in Cardiology Harrell Gave End, MD)    ?Dx: Iron deficiency anemia, unspecified i...    ?0 Result Notes ?          ?Component Ref Range & Units 1 d ago ?(02/09/22) 3 wk ago ?(01/15/22) 2 mo ago ?(11/19/21) 10 mo ago ?(03/20/21) 11 mo ago ?(03/04/21) 11 mo ago ?(02/27/21) 1 yr ago ?(08/26/20)  ?Sodium 135 - 145 mmol/L 141  135  136 R  136 R  134 Low   139  138   ?Potassium 3.5 - 5.1 mmol/L 3.8  4.0  3.8 R  4.2 R  3.4 Low   3.4 Low   3.9   ?Chloride 98 - 111 mmol/L 109  103  104 R  106 R  103  108  108   ?CO2 22 - 32 mmol/L '22  24  17 '$ Low  R  15 Low  R  23  21 Low   22   ?Glucose, Bld 70 - 99 mg/dL 130 High   110 High  CM  112 High   95 R  106 High  CM  99 CM  107 High  CM   ?Comment: Glucose reference range applies only to samples taken after fasting for at least 8 hours.  ?BUN 8 - 23 mg/dL 62 High   48 High   33 High  R  36 High  R  50 High   58 High   37 High    ?Creatinine, Ser 0.61 - 1.24 mg/dL 2.94 High   2.73 High   2.46 High  R  2.63 High  R  2.90 High   2.46 High   2.22 High    ?Calcium 8.9 -  10.3 mg/dL 8.6 Low   8.3 Low   8.0 Low  R  8.0 Low  R  8.2 Low   8.7 Low   8.3 Low    ?Total Protein 6.5 - 8.1 g/dL 6.9  7.1  6.1 R   7.3  7.0  6.9   ?Albumin 3.5 - 5.0 g/dL 3.7  3.8  3.7 R   3.5  4.0  3.8   ?AST 15 - 41 U/L '20  23  17 '$ R   30  20  21   ?ALT 0 - 44 U/L '15  16  13 '$ R   '21  20  17   '$ ?Alkaline Phosphatase 38 - 126 U/L 53  53  76 R   51  52  48   ?Total Bilirubin 0.3 - 1.2 mg/dL 0.7  0.7  0.3 R   1.4 High   0.8  0.8   ?GFR, Estimated >60 mL/min 20 Low   22 Low  CM    21 Low  CM  26 Low  CM    ?Comment: (NOTE)  ?Calculated using the CKD-EPI Creatinine Equation (2021)   ?Anion gap 5 - '15 10  8 '$ CM    8 CM  10 CM  8 CM   ?Comment: Performed at [facility], [address], [place], [place] [address]  ?Resulting Agency  [facility] [place] [facility] [place] [facility] [place] [facility] [place]  ?  ? ?  ?  ?Specimen Collected: 02/09/22 08:47 Last Resulted: 02/09/22 09:37  ?  ?  Lab Flowsheet   ? Order Details   ? View Encounter   ? Lab and Collection Details   ? Routing   ? Result History    ?View Encounter Conversation    ?  ?CM=Additional comments  R=Reference range differs from displayed range    ?  ?Result Care Coordination ? ? ?Patient Communication ? ? Add Comments   Seen Back to Top  ?  ?  ? ?  ?Iron and TIBC ?Order: [id number] ?Status: Final result    ?Visible to patient: Yes (seen)    ?Next appt: 05/21/2022 at 09:00 AM in Cardiology Harrell Gave End, MD)    ?Dx: Iron deficiency anemia, unspecified i...    ?0 Result Notes ?          ?Component Ref Range & Units 1 d ago ?(02/09/22) 3 wk ago ?(01/15/22) 4 mo ago ?(10/13/21) 7 mo ago ?(07/13/21) 10 mo ago ?(03/26/21) 1 yr ago ?(12/26/20) 1 yr ago ?(10/22/20)  ?Iron 45 - 182 ug/dL 62  77  57  76  67  79  75   ?TIBC 250 - 450 ug/dL 273  263  239 Low   266  263  265  259   ?Saturation Ratios 17.9 - 39.5 % '23  29  24  29  26  30  29   '$ ?UIBC ug/dL 211  186 CM  182 CM  190 CM  196 CM  186 CM  184 CM   ?Comment: Performed at [facility], [address] [place], [place] [address]  ?Resulting Agency  [place] [facility] [place] [facility] [place] [facility] [place] [facility] [place] [facility] [place] [facility] [place] [facility]  ?  ? ?  ?  ?Specimen Collected: 02/09/22 08:47 Last Resulted: 02/09/22 09:37  ?  ?  Lab Flowsheet   ? Order Details   ? View Encounter   ? Lab and Collection Details   ? Routing   ? Result History    ?View Encounter Conversation    ?  ?CM=Additional comments    ?  ?Result Care Coordination ? ? ?Patient Communication ? ? Add Comments   Seen Back to Top  ?  ?  ? ?  ?Ferritin ?Order: [id number] ?Status: Final result    ?Visible to patient: Yes (seen)    ?Next appt: 05/21/2022 at 09:00 AM in Cardiology Harrell Gave End, MD)    ?  Dx: Iron deficiency anemia, unspecified i...    ?0 Result Notes ?          ?Component Ref Range & Units 1 d ago ?(02/09/22) 3 wk ago ?(01/15/22) 4 mo ago ?(10/13/21) 7 mo ago ?(07/13/21) 10 mo ago ?(03/26/21) 1 yr ago ?(12/26/20) 1 yr ago ?(10/22/20)  ?Ferritin 24 - 336 ng/mL 127  176 CM  165 CM  106 CM  219 CM  202 CM  277 CM   ?Comment: Performed at [facility], [address], [place], [place] [address]  ?Resulting Agency  [place] [facility] [place] [facility] [place] [facility] [place] [facility] [place] [facility] [place] [facility] [place] [facility]  ?  ? ?  ?  ?Specimen Collected: 02/09/22 08:47 Last Resulted: 02/09/22 09:37  ?  ?  ? ?

## 2022-02-10 NOTE — Telephone Encounter (Signed)
I called pt house and pt. Answered. I told him that we got a call about add on labs and no one told you what it was for. I told him that we saw pt 2/10 and had labs and he was to come back to [place] 6 months for labs. I did see that cardiac doctor asked for you to get labs today. I do see that the hgb is 9.6 which is lower than it was on 2/10 but at that number he would not need a transfusion of blood. I asked him if he wanted to call or speak to his daughter and he said there is no need he can tell her. I told him if there is something I can help with. He could call me back. He said thank you ?

## 2022-02-11 ENCOUNTER — Telehealth: Payer: Self-pay | Admitting: Internal Medicine

## 2022-02-11 NOTE — Telephone Encounter (Signed)
Called daughter Davy Pique.  ? ?Explained that after looking through chart patient appears to have been scheduled for labs on 3/7 in clinic, however he went to [facility]. Because he has standing lab orders at the [facility], they drew those labs.  ? ?Patient was to have lipid profile and ALT drawn per telephone encounter from 12/19, but because they were ordered for clinic collect, they were not drawn when patient mistakenly went to [facility]. Sonya verbalized understanding and appreciated the help on understanding what happened.  ? ?Lab appointment rescheduled for Monday, 3/13 at 8:15.  ?

## 2022-02-11 NOTE — Telephone Encounter (Signed)
Patient calling to discuss recent lab testing results  ? ?Labs were drawn at medical mall and resulted under Dr. Janese Banks but previous labs for End in December recommended a 3 m fu .  ? ?Daughter Davy Pique called cancer center to review and they advised her these were ordered by Dr. Saunders Revel and to call cardiology for results.  ? ? ?Please call daughter .  ? ?

## 2022-02-15 ENCOUNTER — Other Ambulatory Visit: Payer: Self-pay

## 2022-02-15 ENCOUNTER — Other Ambulatory Visit (INDEPENDENT_AMBULATORY_CARE_PROVIDER_SITE_OTHER): Payer: Medicare HMO

## 2022-02-15 DIAGNOSIS — E785 Hyperlipidemia, unspecified: Secondary | ICD-10-CM

## 2022-02-15 DIAGNOSIS — D631 Anemia in chronic kidney disease: Secondary | ICD-10-CM | POA: Diagnosis not present

## 2022-02-15 DIAGNOSIS — I10 Essential (primary) hypertension: Secondary | ICD-10-CM | POA: Diagnosis not present

## 2022-02-15 DIAGNOSIS — N184 Chronic kidney disease, stage 4 (severe): Secondary | ICD-10-CM | POA: Diagnosis not present

## 2022-02-16 DIAGNOSIS — N184 Chronic kidney disease, stage 4 (severe): Secondary | ICD-10-CM | POA: Diagnosis not present

## 2022-02-16 DIAGNOSIS — N200 Calculus of kidney: Secondary | ICD-10-CM | POA: Diagnosis not present

## 2022-02-16 DIAGNOSIS — N139 Obstructive and reflux uropathy, unspecified: Secondary | ICD-10-CM | POA: Diagnosis not present

## 2022-02-16 LAB — LIPID PANEL
Chol/HDL Ratio: 3.3 ratio (ref 0.0–5.0)
Cholesterol, Total: 127 mg/dL (ref 100–199)
HDL: 38 mg/dL — ABNORMAL LOW (ref >39)
LDL Chol Calc (NIH): 63 mg/dL (ref 0–99)
Triglycerides: 148 mg/dL (ref 0–149)
VLDL Cholesterol Cal: 26 mg/dL (ref 5–40)

## 2022-02-16 LAB — ALT: ALT: 14 IU/L (ref 0–44)

## 2022-02-19 ENCOUNTER — Other Ambulatory Visit: Payer: Self-pay | Admitting: Internal Medicine

## 2022-02-22 ENCOUNTER — Other Ambulatory Visit: Payer: Self-pay | Admitting: Internal Medicine

## 2022-02-26 ENCOUNTER — Other Ambulatory Visit: Payer: Self-pay | Admitting: Internal Medicine

## 2022-02-26 DIAGNOSIS — I10 Essential (primary) hypertension: Secondary | ICD-10-CM | POA: Diagnosis not present

## 2022-02-26 DIAGNOSIS — D631 Anemia in chronic kidney disease: Secondary | ICD-10-CM | POA: Diagnosis not present

## 2022-02-26 DIAGNOSIS — N184 Chronic kidney disease, stage 4 (severe): Secondary | ICD-10-CM | POA: Diagnosis not present

## 2022-02-26 MED ORDER — HYDRALAZINE HCL 100 MG PO TABS: 100.0000 mg | ORAL_TABLET | Freq: Three times a day (TID) | ORAL | 0 refills | Status: DC

## 2022-03-12 ENCOUNTER — Other Ambulatory Visit: Payer: Self-pay | Admitting: Family

## 2022-03-12 DIAGNOSIS — I471 Supraventricular tachycardia: Secondary | ICD-10-CM

## 2022-03-12 DIAGNOSIS — I440 Atrioventricular block, first degree: Secondary | ICD-10-CM

## 2022-03-12 DIAGNOSIS — I491 Atrial premature depolarization: Secondary | ICD-10-CM

## 2022-03-18 ENCOUNTER — Other Ambulatory Visit: Payer: Self-pay | Admitting: Family

## 2022-03-18 ENCOUNTER — Other Ambulatory Visit: Payer: Self-pay | Admitting: Internal Medicine

## 2022-03-18 DIAGNOSIS — I471 Supraventricular tachycardia: Secondary | ICD-10-CM

## 2022-03-18 DIAGNOSIS — I440 Atrioventricular block, first degree: Secondary | ICD-10-CM

## 2022-03-18 DIAGNOSIS — I491 Atrial premature depolarization: Secondary | ICD-10-CM

## 2022-03-21 ENCOUNTER — Other Ambulatory Visit: Payer: Self-pay | Admitting: Internal Medicine

## 2022-03-25 DIAGNOSIS — H2512 Age-related nuclear cataract, left eye: Secondary | ICD-10-CM | POA: Diagnosis not present

## 2022-03-25 DIAGNOSIS — Z01818 Encounter for other preprocedural examination: Secondary | ICD-10-CM | POA: Diagnosis not present

## 2022-03-25 DIAGNOSIS — H2511 Age-related nuclear cataract, right eye: Secondary | ICD-10-CM | POA: Diagnosis not present

## 2022-03-30 DIAGNOSIS — I10 Essential (primary) hypertension: Secondary | ICD-10-CM | POA: Diagnosis not present

## 2022-03-30 DIAGNOSIS — N184 Chronic kidney disease, stage 4 (severe): Secondary | ICD-10-CM | POA: Diagnosis not present

## 2022-04-12 DIAGNOSIS — H2512 Age-related nuclear cataract, left eye: Secondary | ICD-10-CM | POA: Diagnosis not present

## 2022-04-17 ENCOUNTER — Other Ambulatory Visit: Payer: Self-pay | Admitting: Internal Medicine

## 2022-04-22 DIAGNOSIS — E872 Acidosis, unspecified: Secondary | ICD-10-CM | POA: Diagnosis not present

## 2022-04-22 DIAGNOSIS — N184 Chronic kidney disease, stage 4 (severe): Secondary | ICD-10-CM | POA: Diagnosis not present

## 2022-04-22 DIAGNOSIS — I10 Essential (primary) hypertension: Secondary | ICD-10-CM | POA: Diagnosis not present

## 2022-04-26 DIAGNOSIS — H2511 Age-related nuclear cataract, right eye: Secondary | ICD-10-CM | POA: Diagnosis not present

## 2022-05-13 ENCOUNTER — Other Ambulatory Visit: Payer: Self-pay | Admitting: Internal Medicine

## 2022-05-20 ENCOUNTER — Other Ambulatory Visit: Payer: Self-pay | Admitting: Internal Medicine

## 2022-05-21 ENCOUNTER — Other Ambulatory Visit
Admission: RE | Admit: 2022-05-21 | Discharge: 2022-05-21 | Disposition: A | Discharge status: Home / Self Care | Payer: Medicare HMO | Attending: Internal Medicine | Admitting: Internal Medicine

## 2022-05-21 ENCOUNTER — Ambulatory Visit: Payer: Medicare HMO | Admitting: Internal Medicine

## 2022-05-21 ENCOUNTER — Encounter: Payer: Self-pay | Admitting: Internal Medicine

## 2022-05-21 VITALS — BP 114/56 | HR 49 | Ht 67.0 in | Wt 171.6 lb

## 2022-05-21 DIAGNOSIS — I25118 Atherosclerotic heart disease of native coronary artery with other forms of angina pectoris: Secondary | ICD-10-CM

## 2022-05-21 DIAGNOSIS — I471 Supraventricular tachycardia: Secondary | ICD-10-CM

## 2022-05-21 DIAGNOSIS — I5032 Chronic diastolic (congestive) heart failure: Secondary | ICD-10-CM

## 2022-05-21 DIAGNOSIS — I10 Essential (primary) hypertension: Secondary | ICD-10-CM | POA: Diagnosis not present

## 2022-05-21 DIAGNOSIS — I7121 Aneurysm of the ascending aorta, without rupture: Secondary | ICD-10-CM | POA: Diagnosis not present

## 2022-05-21 DIAGNOSIS — I2510 Atherosclerotic heart disease of native coronary artery without angina pectoris: Secondary | ICD-10-CM

## 2022-05-21 DIAGNOSIS — Z79899 Other long term (current) drug therapy: Secondary | ICD-10-CM | POA: Diagnosis not present

## 2022-05-21 LAB — COMPREHENSIVE METABOLIC PANEL
ALT: 15 U/L (ref 0–44)
AST: 21 U/L (ref 15–41)
Albumin: 3.7 g/dL (ref 3.5–5.0)
Alkaline Phosphatase: 49 U/L (ref 38–126)
Anion gap: 6 (ref 5–15)
BUN: 41 mg/dL — ABNORMAL HIGH (ref 8–23)
CO2: 26 mmol/L (ref 22–32)
Calcium: 8.8 mg/dL — ABNORMAL LOW (ref 8.9–10.3)
Chloride: 109 mmol/L (ref 98–111)
Creatinine, Ser: 2.47 mg/dL — ABNORMAL HIGH (ref 0.61–1.24)
GFR, Estimated: 25 mL/min — ABNORMAL LOW (ref >60)
Glucose, Bld: 114 mg/dL — ABNORMAL HIGH (ref 70–99)
Potassium: 4.1 mmol/L (ref 3.5–5.1)
Sodium: 141 mmol/L (ref 135–145)
Total Bilirubin: 1 mg/dL (ref 0.3–1.2)
Total Protein: 6.8 g/dL (ref 6.5–8.1)

## 2022-05-21 LAB — TSH: TSH: 2.891 u[IU]/mL (ref 0.350–4.500)

## 2022-05-21 NOTE — Patient Instructions (Signed)
Medication Instructions:   Your physician has recommended you make the following change in your medication:   STOP Carvedilol   *If you need a refill on your cardiac medications before your next appointment, please call your pharmacy*   Lab Work:  Today at the medical mall at [facility]: [place], TSH  -  Please go to the [facility] in at the Registration Desk: 1st desk to the right, past the screening table  If you have labs (blood work) drawn today and your tests are completely normal, you will receive your results only by: [place] (if you have MyChart) OR A paper copy in the mail If you have any lab test that is abnormal or we need to change your treatment, we will call you to review the results.   Testing/Procedures:  None ordered   Follow-Up: At [facility], you and your health needs are our priority.  As part of our continuing mission to provide you with exceptional heart care, we have created designated Provider Care Teams.  These Care Teams include your primary Cardiologist (physician) and Advanced Practice Providers (APPs -  Physician Assistants and Nurse Practitioners) who all work together to provide you with the care you need, when you need it.  We recommend signing up for the patient portal called "MyChart".  Sign up information is provided on this After Visit Summary.  MyChart is used to connect with patients for Virtual Visits (Telemedicine).  Patients are able to view lab/test results, encounter notes, upcoming appointments, etc.  Non-urgent messages can be sent to your provider as well.   To learn more about what you can do with MyChart, go to [email or web address].    Your next appointment:   6 month(s)  The format for your next appointment:   In Person  Provider:   You may see Nelva Bush, MD or one of the following Advanced Practice Providers on your designated Care Team:   Murray Hodgkins, NP Christell Faith, PA-C [place]  Kathlen Mody, PA-C{   Important Information About Sugar

## 2022-05-21 NOTE — Progress Notes (Unsigned)
Follow-up Outpatient Visit Date: 05/21/2022  Primary Care Provider: Remi Haggard, [place] [address]  Chief Complaint: Follow-up CAD, TAA, valvular heart disease, and HFpEF  HPI:  [name] is a 86 y.o. male with history of resistant hypertension, coronary artery disease status post CABG (3/19) aortic regurgitation and thoracic aortic aneurysm status post Bentall with bioprosthetic aortic valve replacement (3/19), SVT and frequent PACs, HFpEF, and chronic kidney disease stage 3 with questionable left renal artery stenosis, who presents for follow-up of coronary artery disease, valvular heart disease, and hypertension.  I last saw him in 11/2021, at which time [name] was feeling well.  He was using torsemide 3 days a week to control his leg edema.  Blood pressures were generally well controlled at home.  He was noted to have worsening renal function in early March and has been followed by [facility] nephrology.  Most recent creatinine in late April was 2.8.  Today, Mr. Kissick feels about the same as at prior visits.  He has stable exertional dyspnea but is still able to push mow for 20 to 30 minutes at a time before needing to rest.  He denies chest pain, palpitations, and lightheadedness.  Chronic lower extremity edema is generally well controlled with torsemide.  He is tolerating his medications well.  --------------------------------------------------------------------------------------------------  Cardiovascular History & Procedures: Cardiovascular Problems: Thoracic aortic aneurysm s/p Bentall Aortic regurgitation s/p bioprosthetic AVR Mitral regurgitation Questionable renal artery stenosis Coronary artery disease s/p CABG Supraventricular tachycardia Postoperative atrial fibrillation   Risk Factors: Known CAD, hypertension, hyperlipidemia, male gender, and age > 6   Cath/PCI: Coronary angiography/RHC (01/16/2018): LMCA normal.  LAD with sequential  20% ostial, 40% proximal, and long 70% proximal to mid stenoses.  LCx with 60% proximal stenosis and 60 to 80-90% OM2 and OM3 lesions.  Chronic total occlusion of the proximal/mid RCA with left-to-right collaterals.  RA 9, RV 43/10, PA 37/15 (22), and PCWP 18.  Fick CO/CI 5.1/2.6.   CV Surgery: CABG and Bentall with bioprosthetic AVR (02/22/2018, Dr. Roxy Manns): LIMA to LAD, SVG to OM2, and SVG to PDA.  24 mm synthetic aortic root graft and 21 mm Edwards Inspiris Roselia stented bovine pericardial tissue valve.   EP Procedures and Devices: Event monitor (01/22/19): Patient was monitored for 2 days, 22 hours.  Predominantly sinus rhythm with rare PAC's and frequent PVC's.  Several brief atrial runs were noted.  PVC burden 8.4%.   Non-Invasive Evaluation(s): TTE (03/17/2021): Normal LV size with mild LVH.  LVEF 60-65%.  Indeterminate diastolic function.  Normal RV size and function.  Normal PA pressure.  Normal biatrial size.  Mild-moderate MR.  Bioprosthetic aortic valve with mean gradient 21 mmHg and AVA 1.0 cm.  Normal CVP. Renal artery Doppler (02/06/19): No evidence of significant renal artery stenosis affecting either kidney. TTE (04/07/18): Normal LV size with mild LVH.  LVEF 50 to 55% with anteroseptal and septal hypokinesis.  Grade 2 diastolic dysfunction.  Bioprosthetic aortic valve present with mean gradient of 17 mmHg.  Mild mitral regurgitation.  Normal RV size and function.  No pericardial effusion. TEE (09/27/17): Mildly dilated LV with LVEF 60-65%. Moderate aortic regurgitation. Mild to moderate mitral regurgitation. Mild right atrial enlargement. Moderate aortic dilation, with root measuring 5.0 cm and ascending aorta 4.7 cm MRA chest (06/06/17): Stable ascending aortic aneurysm, measuring 5.0 cm at the sinotubular junction and 4.8 cm at the level of the main pulmonary artery. Renal artery Doppler (01/13/17): Aortic atherosclerosis without stenosis or  aneurysm. Normal right renal artery. Mild to  moderate left renal artery stenosis (1-59%). Normal/symmetric kidney size. MRA chest (12/27/16): Aneurysmal disease of the ascending aorta, measuring 4.3 cm at the sinus of Valsalva and 4.9 cm above the sinotubular junction. TTE (12/14/16): Mildly dilated LV with mild LVH. Normal contraction with LVEF 55-60%. Grade 1 diastolic dysfunction. Mild to moderate aortic regurgitation. Dilated aorta, measuring up to 4.9 cm above the sinotubular junction. Mild to moderate mitral regurgitation. Mild left atrial enlargement. Normal RV size and function. Transthoracic echo (09/2016, [place]): Full report not available.  Reportedly showed LVH with LVEF 99% and diastolic dysfunction.  MR and moderate to severe AI present with sclerotic aortic valve. Carotid Doppler (10/201&): Full report not available.  Reportedly showed 1-39% stenosis bilaterally. Transthoracic echo (02/18/16, [facility]): Normal LV and RV size and function.  LVEF > 55%.  Moderate AI; mild MR and TR.  Recent CV Pertinent Labs: Lab Results  Component Value Date   CHOL 127 02/15/2022   HDL 38 (L) 02/15/2022   LDLCALC 63 02/15/2022   TRIG 148 02/15/2022   CHOLHDL 3.3 02/15/2022   CHOLHDL 3.8 03/11/2017   INR 2.9 05/24/2018   INR 7.53 (HH) 03/27/2018   K 3.8 02/09/2022   MG 1.9 11/19/2021   BUN 62 (H) 02/09/2022   BUN 33 (H) 11/19/2021   CREATININE 2.94 (H) 02/09/2022    Past medical and surgical history were reviewed and updated in EPIC.  Current Meds  Medication Sig   amiodarone (PACERONE) 100 MG tablet TAKE 1 TABLET BY MOUTH EVERY DAY   amLODipine (NORVASC) 10 MG tablet TAKE 1 TABLET BY MOUTH EVERY DAY   ascorbic acid (VITAMIN C) 500 MG tablet Take 500 mg by mouth daily.   aspirin EC 81 MG tablet Take 81 mg by mouth daily.   atorvastatin (LIPITOR) 40 MG tablet Take 1 tablet (40 mg total) by mouth daily.   cholecalciferol (VITAMIN D3) 25 MCG (1000 UT) tablet Take 1,000 Units by mouth daily.   doxazosin  (CARDURA) 4 MG tablet TAKE 1 TABLET BY MOUTH EVERY DAY   FARXIGA 10 MG TABS tablet Take 1 tablet (10 mg total) by mouth daily.   ferrous sulfate 325 (65 FE) MG EC tablet Take 325 mg by mouth daily with breakfast.    hydrALAZINE (APRESOLINE) 100 MG tablet TAKE 1 TABLET BY MOUTH THREE TIMES A DAY   isosorbide mononitrate (IMDUR) 60 MG 24 hr tablet Take 1 tablet (60 mg total) by mouth daily.   levothyroxine (SYNTHROID) 25 MCG tablet Take 25 mcg by mouth every morning.   nitroGLYCERIN (NITROSTAT) 0.4 MG SL tablet Place 1 tablet (0.4 mg total) under the tongue every 5 (five) minutes as needed for chest pain. Maximum of 3 doses.   omeprazole (PRILOSEC) 20 MG capsule Take 20 mg by mouth daily.   torsemide (DEMADEX) 10 MG tablet TAKE 1 TABLET BY MOUTH EVERY DAY AS NEEDED   [DISCONTINUED] carvedilol (COREG) 3.125 MG tablet Take 3.125 mg by mouth 2 (two) times daily with a meal.    Allergies: Patient has no known allergies.  Social History   Tobacco Use   Smoking status: Never   Smokeless tobacco: Never  Vaping Use   Vaping Use: Never used  Substance Use Topics   Alcohol use: No   Drug use: No    Family History  Problem Relation Age of Onset   Hypertension Mother    Stroke Mother    Leukemia Father  Heart attack Paternal Grandmother    Stroke Paternal Grandfather    Heart Problems Son 2   Heart Problems Son 73   Uterine cancer Sister    Prostate cancer Brother    Colon cancer Brother    Kidney failure Brother    Stroke Sister     Review of Systems: A 12-system review of systems was performed and was negative except as noted in the HPI.  --------------------------------------------------------------------------------------------------  Physical Exam: BP (!) 114/56 (BP Location: Left Arm, Patient Position: Sitting, Cuff Size: Normal)   Pulse (!) 49   Ht '5\' 7"'$  (1.702 m)   Wt 171 lb 9.6 oz (77.8 kg)   BMI 26.88 kg/m   General:  NAD. Neck: No JVD or HJR. Lungs: Clear to  auscultation bilaterally without wheezes or crackles. Heart: Bradycardic but regular with 3/6 systolic murmur. Abdomen: Soft, nontender, nondistended. Extremities: Trace pretibial edema bilaterally.  EKG: Sinus bradycardia with first-degree AV block, left axis deviation, and poor R wave progression.  Heart rate has decreased slightly since 11/19/2021.  Otherwise, no significant interval change.  Lab Results  Component Value Date   WBC 5.6 02/09/2022   HGB 9.6 (L) 02/09/2022   HCT 29.1 (L) 02/09/2022   MCV 97.7 02/09/2022   PLT 147 (L) 02/09/2022    Lab Results  Component Value Date   NA 141 02/09/2022   K 3.8 02/09/2022   CL 109 02/09/2022   CO2 22 02/09/2022   BUN 62 (H) 02/09/2022   CREATININE 2.94 (H) 02/09/2022   GLUCOSE 130 (H) 02/09/2022   ALT 14 02/15/2022    Lab Results  Component Value Date   CHOL 127 02/15/2022   HDL 38 (L) 02/15/2022   LDLCALC 63 02/15/2022   TRIG 148 02/15/2022   CHOLHDL 3.3 02/15/2022    --------------------------------------------------------------------------------------------------  ASSESSMENT AND PLAN: CAD: No angina reported.  Continue aspirin and atorvastatin for secondary prevention.  Given bradycardia and first degree AV block, we will stop carvedilol.  TAA s/p Bentall: No symptoms.  Continue BP conrol and diuresis with prn torsemide.  Chronic HFpEF: Breathing is stable.  Chronic LE edema well-controlled with prn torsemide.  Hypertension: BP low-normal today.  We will hold carvedilol in the setting of concurrent sinus bradycardia and first degree AV block.  PSVT: Continue low-dose amiodarone as heart rate allows.  We will hold carvedilol with bradycardia and first degree AV block.  We will check a CMP and TSH for amiodarone surveillance.  CXR in 11/2021 showed stable chronic lung findings.  Hyperlipidemia: Continue atorvastatin 40 mg daily.  Goal LDL < 70.  Chronic kidney disease stage 4: Continue lisinopril and prn  torsemide.  Recheck CMP today.  Avoid nephrotoxic drugs.  Ongoing follow-up with [facility] nephrology.  Follow-up: Return to clinic in 6 months.  Nelva Bush, MD 05/21/2022 9:29 AM

## 2022-05-22 DIAGNOSIS — I7121 Aneurysm of the ascending aorta, without rupture: Secondary | ICD-10-CM | POA: Insufficient documentation

## 2022-05-24 ENCOUNTER — Other Ambulatory Visit: Payer: Self-pay | Admitting: Medical

## 2022-05-28 ENCOUNTER — Other Ambulatory Visit: Payer: Self-pay | Admitting: Medical

## 2022-06-05 ENCOUNTER — Other Ambulatory Visit: Payer: Self-pay | Admitting: Internal Medicine

## 2022-06-10 DIAGNOSIS — E039 Hypothyroidism, unspecified: Secondary | ICD-10-CM | POA: Diagnosis not present

## 2022-06-10 DIAGNOSIS — H5462 Unqualified visual loss, left eye, normal vision right eye: Secondary | ICD-10-CM | POA: Diagnosis not present

## 2022-06-10 DIAGNOSIS — E782 Mixed hyperlipidemia: Secondary | ICD-10-CM | POA: Diagnosis not present

## 2022-06-10 DIAGNOSIS — E559 Vitamin D deficiency, unspecified: Secondary | ICD-10-CM | POA: Diagnosis not present

## 2022-06-10 DIAGNOSIS — D519 Vitamin B12 deficiency anemia, unspecified: Secondary | ICD-10-CM | POA: Diagnosis not present

## 2022-06-10 DIAGNOSIS — E7800 Pure hypercholesterolemia, unspecified: Secondary | ICD-10-CM | POA: Diagnosis not present

## 2022-06-10 DIAGNOSIS — I10 Essential (primary) hypertension: Secondary | ICD-10-CM | POA: Diagnosis not present

## 2022-06-18 ENCOUNTER — Other Ambulatory Visit: Payer: Self-pay | Admitting: Internal Medicine

## 2022-06-18 ENCOUNTER — Other Ambulatory Visit: Payer: Self-pay | Admitting: Family

## 2022-06-18 DIAGNOSIS — I491 Atrial premature depolarization: Secondary | ICD-10-CM

## 2022-06-18 DIAGNOSIS — I471 Supraventricular tachycardia: Secondary | ICD-10-CM

## 2022-06-18 DIAGNOSIS — I440 Atrioventricular block, first degree: Secondary | ICD-10-CM

## 2022-07-15 ENCOUNTER — Other Ambulatory Visit: Payer: Medicare HMO

## 2022-07-15 DIAGNOSIS — N400 Enlarged prostate without lower urinary tract symptoms: Secondary | ICD-10-CM | POA: Diagnosis not present

## 2022-07-16 ENCOUNTER — Inpatient Hospital Stay: Payer: Medicare HMO | Attending: Oncology

## 2022-07-16 ENCOUNTER — Encounter: Payer: Self-pay | Admitting: Urology

## 2022-07-16 DIAGNOSIS — N184 Chronic kidney disease, stage 4 (severe): Secondary | ICD-10-CM | POA: Diagnosis not present

## 2022-07-16 DIAGNOSIS — D631 Anemia in chronic kidney disease: Secondary | ICD-10-CM | POA: Diagnosis not present

## 2022-07-16 DIAGNOSIS — D509 Iron deficiency anemia, unspecified: Secondary | ICD-10-CM | POA: Diagnosis not present

## 2022-07-16 LAB — PSA: Prostate Specific Ag, Serum: 11.3 ng/mL — ABNORMAL HIGH (ref 0.0–4.0)

## 2022-07-16 LAB — FERRITIN: Ferritin: 125 ng/mL (ref 24–336)

## 2022-07-16 LAB — IRON AND TIBC
Iron: 61 ug/dL (ref 45–182)
Saturation Ratios: 23 % (ref 17.9–39.5)
TIBC: 272 ug/dL (ref 250–450)
UIBC: 211 ug/dL

## 2022-07-16 LAB — CBC
HCT: 29.2 % — ABNORMAL LOW (ref 39.0–52.0)
Hemoglobin: 9.8 g/dL — ABNORMAL LOW (ref 13.0–17.0)
MCH: 33.3 pg (ref 26.0–34.0)
MCHC: 33.6 g/dL (ref 30.0–36.0)
MCV: 99.3 fL (ref 80.0–100.0)
Platelets: 140 10*3/uL — ABNORMAL LOW (ref 150–400)
RBC: 2.94 MIL/uL — ABNORMAL LOW (ref 4.22–5.81)
RDW: 13.1 % (ref 11.5–15.5)
WBC: 6.2 10*3/uL (ref 4.0–10.5)
nRBC: 0 % (ref 0.0–0.2)

## 2022-07-16 LAB — COMPREHENSIVE METABOLIC PANEL
ALT: 16 U/L (ref 0–44)
AST: 20 U/L (ref 15–41)
Albumin: 3.8 g/dL (ref 3.5–5.0)
Alkaline Phosphatase: 54 U/L (ref 38–126)
Anion gap: 12 (ref 5–15)
BUN: 51 mg/dL — ABNORMAL HIGH (ref 8–23)
CO2: 22 mmol/L (ref 22–32)
Calcium: 8.4 mg/dL — ABNORMAL LOW (ref 8.9–10.3)
Chloride: 104 mmol/L (ref 98–111)
Creatinine, Ser: 2.93 mg/dL — ABNORMAL HIGH (ref 0.61–1.24)
GFR, Estimated: 20 mL/min — ABNORMAL LOW (ref >60)
Glucose, Bld: 108 mg/dL — ABNORMAL HIGH (ref 70–99)
Potassium: 4.1 mmol/L (ref 3.5–5.1)
Sodium: 138 mmol/L (ref 135–145)
Total Bilirubin: 0.4 mg/dL (ref 0.3–1.2)
Total Protein: 6.9 g/dL (ref 6.5–8.1)

## 2022-07-23 ENCOUNTER — Other Ambulatory Visit: Payer: Self-pay | Admitting: Internal Medicine

## 2022-08-03 DIAGNOSIS — N184 Chronic kidney disease, stage 4 (severe): Secondary | ICD-10-CM | POA: Diagnosis not present

## 2022-08-03 DIAGNOSIS — I10 Essential (primary) hypertension: Secondary | ICD-10-CM | POA: Diagnosis not present

## 2022-08-10 DIAGNOSIS — E872 Acidosis, unspecified: Secondary | ICD-10-CM | POA: Diagnosis not present

## 2022-08-10 DIAGNOSIS — D649 Anemia, unspecified: Secondary | ICD-10-CM | POA: Diagnosis not present

## 2022-08-10 DIAGNOSIS — I10 Essential (primary) hypertension: Secondary | ICD-10-CM | POA: Diagnosis not present

## 2022-08-10 DIAGNOSIS — N184 Chronic kidney disease, stage 4 (severe): Secondary | ICD-10-CM | POA: Diagnosis not present

## 2022-08-10 DIAGNOSIS — N2581 Secondary hyperparathyroidism of renal origin: Secondary | ICD-10-CM | POA: Diagnosis not present

## 2022-08-15 ENCOUNTER — Other Ambulatory Visit: Payer: Self-pay | Admitting: Internal Medicine

## 2022-08-17 ENCOUNTER — Other Ambulatory Visit: Payer: Self-pay | Admitting: Internal Medicine

## 2022-08-18 DIAGNOSIS — D225 Melanocytic nevi of trunk: Secondary | ICD-10-CM | POA: Diagnosis not present

## 2022-08-18 DIAGNOSIS — D2262 Melanocytic nevi of left upper limb, including shoulder: Secondary | ICD-10-CM | POA: Diagnosis not present

## 2022-08-18 DIAGNOSIS — X32XXXA Exposure to sunlight, initial encounter: Secondary | ICD-10-CM | POA: Diagnosis not present

## 2022-08-18 DIAGNOSIS — L570 Actinic keratosis: Secondary | ICD-10-CM | POA: Diagnosis not present

## 2022-08-18 DIAGNOSIS — D2272 Melanocytic nevi of left lower limb, including hip: Secondary | ICD-10-CM | POA: Diagnosis not present

## 2022-08-18 DIAGNOSIS — L821 Other seborrheic keratosis: Secondary | ICD-10-CM | POA: Diagnosis not present

## 2022-08-18 DIAGNOSIS — D2271 Melanocytic nevi of right lower limb, including hip: Secondary | ICD-10-CM | POA: Diagnosis not present

## 2022-08-18 DIAGNOSIS — D2261 Melanocytic nevi of right upper limb, including shoulder: Secondary | ICD-10-CM | POA: Diagnosis not present

## 2022-09-08 DIAGNOSIS — H469 Unspecified optic neuritis: Secondary | ICD-10-CM | POA: Diagnosis not present

## 2022-09-09 ENCOUNTER — Other Ambulatory Visit: Payer: Self-pay | Admitting: Internal Medicine

## 2022-09-13 ENCOUNTER — Other Ambulatory Visit: Payer: Self-pay | Admitting: Internal Medicine

## 2022-09-13 DIAGNOSIS — D649 Anemia, unspecified: Secondary | ICD-10-CM | POA: Diagnosis not present

## 2022-09-13 DIAGNOSIS — I701 Atherosclerosis of renal artery: Secondary | ICD-10-CM | POA: Diagnosis not present

## 2022-09-13 DIAGNOSIS — R5383 Other fatigue: Secondary | ICD-10-CM | POA: Diagnosis not present

## 2022-09-13 DIAGNOSIS — K219 Gastro-esophageal reflux disease without esophagitis: Secondary | ICD-10-CM | POA: Diagnosis not present

## 2022-09-13 DIAGNOSIS — G608 Other hereditary and idiopathic neuropathies: Secondary | ICD-10-CM | POA: Diagnosis not present

## 2022-09-13 DIAGNOSIS — I499 Cardiac arrhythmia, unspecified: Secondary | ICD-10-CM | POA: Diagnosis not present

## 2022-09-13 DIAGNOSIS — I10 Essential (primary) hypertension: Secondary | ICD-10-CM | POA: Diagnosis not present

## 2022-09-13 DIAGNOSIS — E782 Mixed hyperlipidemia: Secondary | ICD-10-CM | POA: Diagnosis not present

## 2022-09-13 DIAGNOSIS — I471 Supraventricular tachycardia, unspecified: Secondary | ICD-10-CM | POA: Diagnosis not present

## 2022-09-13 DIAGNOSIS — I480 Paroxysmal atrial fibrillation: Secondary | ICD-10-CM | POA: Diagnosis not present

## 2022-09-13 DIAGNOSIS — G603 Idiopathic progressive neuropathy: Secondary | ICD-10-CM | POA: Diagnosis not present

## 2022-09-13 DIAGNOSIS — D696 Thrombocytopenia, unspecified: Secondary | ICD-10-CM | POA: Diagnosis not present

## 2022-09-13 DIAGNOSIS — Z23 Encounter for immunization: Secondary | ICD-10-CM | POA: Diagnosis not present

## 2022-09-13 DIAGNOSIS — I350 Nonrheumatic aortic (valve) stenosis: Secondary | ICD-10-CM | POA: Diagnosis not present

## 2022-09-13 DIAGNOSIS — M25552 Pain in left hip: Secondary | ICD-10-CM | POA: Diagnosis not present

## 2022-09-13 DIAGNOSIS — M5136 Other intervertebral disc degeneration, lumbar region: Secondary | ICD-10-CM | POA: Diagnosis not present

## 2022-09-13 DIAGNOSIS — I351 Nonrheumatic aortic (valve) insufficiency: Secondary | ICD-10-CM | POA: Diagnosis not present

## 2022-09-13 DIAGNOSIS — E538 Deficiency of other specified B group vitamins: Secondary | ICD-10-CM | POA: Diagnosis not present

## 2022-09-13 DIAGNOSIS — R0989 Other specified symptoms and signs involving the circulatory and respiratory systems: Secondary | ICD-10-CM | POA: Diagnosis not present

## 2022-09-13 DIAGNOSIS — Z953 Presence of xenogenic heart valve: Secondary | ICD-10-CM | POA: Diagnosis not present

## 2022-09-13 DIAGNOSIS — E559 Vitamin D deficiency, unspecified: Secondary | ICD-10-CM | POA: Diagnosis not present

## 2022-09-13 DIAGNOSIS — D509 Iron deficiency anemia, unspecified: Secondary | ICD-10-CM | POA: Diagnosis not present

## 2022-09-13 DIAGNOSIS — E7800 Pure hypercholesterolemia, unspecified: Secondary | ICD-10-CM | POA: Diagnosis not present

## 2022-09-13 DIAGNOSIS — I491 Atrial premature depolarization: Secondary | ICD-10-CM

## 2022-09-13 DIAGNOSIS — I440 Atrioventricular block, first degree: Secondary | ICD-10-CM

## 2022-09-13 DIAGNOSIS — I712 Thoracic aortic aneurysm, without rupture, unspecified: Secondary | ICD-10-CM | POA: Diagnosis not present

## 2022-09-13 DIAGNOSIS — D519 Vitamin B12 deficiency anemia, unspecified: Secondary | ICD-10-CM | POA: Diagnosis not present

## 2022-09-13 DIAGNOSIS — M79661 Pain in right lower leg: Secondary | ICD-10-CM | POA: Diagnosis not present

## 2022-09-13 DIAGNOSIS — I493 Ventricular premature depolarization: Secondary | ICD-10-CM | POA: Diagnosis not present

## 2022-09-13 DIAGNOSIS — E039 Hypothyroidism, unspecified: Secondary | ICD-10-CM | POA: Diagnosis not present

## 2022-09-13 DIAGNOSIS — I2510 Atherosclerotic heart disease of native coronary artery without angina pectoris: Secondary | ICD-10-CM | POA: Diagnosis not present

## 2022-09-21 ENCOUNTER — Inpatient Hospital Stay: Payer: Medicare HMO | Attending: Oncology | Admitting: Oncology

## 2022-09-21 ENCOUNTER — Encounter: Payer: Self-pay | Admitting: Oncology

## 2022-09-21 VITALS — BP 125/59 | HR 63 | Temp 97.7°F | Resp 16 | Ht 67.0 in | Wt 168.8 lb

## 2022-09-21 DIAGNOSIS — D509 Iron deficiency anemia, unspecified: Secondary | ICD-10-CM

## 2022-09-21 DIAGNOSIS — Z79899 Other long term (current) drug therapy: Secondary | ICD-10-CM | POA: Diagnosis not present

## 2022-09-21 DIAGNOSIS — N183 Chronic kidney disease, stage 3 unspecified: Secondary | ICD-10-CM | POA: Diagnosis not present

## 2022-09-21 DIAGNOSIS — D631 Anemia in chronic kidney disease: Secondary | ICD-10-CM | POA: Insufficient documentation

## 2022-09-21 NOTE — Progress Notes (Signed)
Hematology/Oncology Consult note [facility]  [phone] [phone]  Patient Care Team: Remi Haggard, FNP as PCP - General (Family Medicine) End, Harrell Gave, MD as PCP - Cardiology (Cardiology)   Name of the patient: Rodney Wiley  [id number]  [birth year]   Date of visit: 09/21/22  Diagnosis-  anemia likely secondary to CKD and iron deficiency Thrombocytopenia  Chief complaint/ Reason for visit-routine follow-up of anemia  Heme/Onc history: patient is a 86 year old male with a past medical history significant for hypertension, hyperlipidemia, stage III CKD and history of aortic insufficiency as well as ascending aortic aneurysm.  He recently underwent bioprosthetic aortic valve replacement, CABG x3 and resuspension of the left atrium on 02/22/2018.    Results of blood work in June 2021 were consistent with iron deficiency along with CKD. He received venofer in July 2021.  He has not required any EPO injections so far  Interval history-patient is doing well for his age and denies any specific complaints at this time.  No recent hospitalizations.  ECOG PS- 1 Pain scale- 0   Review of systems- Review of Systems  Constitutional:  Negative for chills, fever, malaise/fatigue and weight loss.  HENT:  Negative for congestion, ear discharge and nosebleeds.   Eyes:  Negative for blurred vision.  Respiratory:  Negative for cough, hemoptysis, sputum production, shortness of breath and wheezing.   Cardiovascular:  Negative for chest pain, palpitations, orthopnea and claudication.  Gastrointestinal:  Negative for abdominal pain, blood in stool, constipation, diarrhea, heartburn, melena, nausea and vomiting.  Genitourinary:  Negative for dysuria, flank pain, frequency, hematuria and urgency.  Musculoskeletal:  Negative for back pain, joint pain and myalgias.  Skin:  Negative for rash.  Neurological:  Negative for dizziness, tingling, focal  weakness, seizures, weakness and headaches.  Endo/Heme/Allergies:  Does not bruise/bleed easily.  Psychiatric/Behavioral:  Negative for depression and suicidal ideas. The patient does not have insomnia.       No Known Allergies   Past Medical History:  Diagnosis Date   Anemia    Aortic insufficiency    a. 02/2018 s/p bioprosthetic AVR; 04/2018 Echo: EF 50-55%, Gr2 DD, Ao bioprosthesis, mean grad 15mHg, Ao root/Asc Ao nl in size, mild MR, mildly dil LA, nl RV fxn. Nl PASP.   Arthropathy    Ascending aortic aneurysm ([place]    a. 12/2016 MRA: 4.3cm @ sinus of valsalva, 4.9cm above sinotubular jxn; b. 02/2018 s/p biological Bentall and AVR.   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 02/2018 Cath: LAD 20ost, 40p, 785mLCX 60p, OM2 60, OM3 85, RCA 10p/m w/ L->R and R->R collats;  b. 02/2018 CABG x 3: LIMA to LAD, SVG to OM2, SVG to PDA, EVH via right thigh.   CKD (chronic kidney disease), stage III (HCC)    Diverticulitis    Essential hypertension    GERD (gastroesophageal reflux disease)    Hemorrhoids    History of chicken pox    History of Helicobacter pylori infection 06/2007   Hyperlipidemia    Hypertension    Left renal artery stenosis (HCC)    a. 01/2017 RA u/s: moderate L RAS.   Lower extremity edema    Nonrheumatic mitral (valve) insufficiency    a. 12/2016 Echo: mild to mod MR; b. 09/2017 TEE: mild to mod MR; c. 04/2018 Echo: Mild MR.   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 s/p 21 mm Edwards Inspiris Resilia stented bovine pericardial tissue valve and 24 mm Gelweave Valsalva synthetic root conduit with reimplantation of left main coronary artery.     Past Surgical History:  Procedure Laterality Date   AORTIC VALVE REPAIR N/A 02/22/2018   Procedure: AORTIC VALVE REPLACEMENT -Biological Bentall aortic root replacement,;  Surgeon:  Rexene Alberts, MD;  Location: [place];  Service: Open Heart Surgery;  Laterality: N/A;   bypass surgery     CARDIAC CATHETERIZATION     01/16/2018   COLONOSCOPY  2011   by Dr. Candace Cruise with findings of diverticulosis   CORONARY ARTERY BYPASS GRAFT N/A 02/22/2018   Procedure: CORONARY ARTERY BYPASS GRAFTING (CABG) x three, using left internal mammary artery and right leg greater saphenous vein harvested endoscopically;  Surgeon: Rexene Alberts, MD;  Location: [place];  Service: Open Heart Surgery;  Laterality: N/A;   CORONARY/GRAFT ANGIOGRAPHY N/A 01/16/2018   Procedure: CORONARY/GRAFT ANGIOGRAPHY;  Surgeon: Nelva Bush, MD;  Location: [place] CV LAB;  Service: Cardiovascular;  Laterality: N/A;   ELBOW SURGERY Left    hemorhoidectomy     RIGHT HEART CATH N/A 01/16/2018   Procedure: RIGHT HEART CATH;  Surgeon: Nelva Bush, MD;  Location: [place] CV LAB;  Service: Cardiovascular;  Laterality: N/A;   SHOULDER ARTHROSCOPY WITH ROTATOR CUFF REPAIR Right    SHOULDER SURGERY Right    TEE WITHOUT CARDIOVERSION N/A 09/27/2017   Procedure: TRANSESOPHAGEAL ECHOCARDIOGRAM (TEE);  Surgeon: Dorothy Spark, MD;  Location[facility] ENDOSCOPY;  Service: Cardiovascular;  Laterality: N/A;   TEE WITHOUT CARDIOVERSION N/A 02/22/2018   Procedure: TRANSESOPHAGEAL ECHOCARDIOGRAM (TEE);  Surgeon: Rexene Alberts, MD;  Location: [place];  Service: Open Heart Surgery;  Laterality: N/A;   THORACIC AORTIC ANEURYSM REPAIR N/A 02/22/2018   Procedure: THORACIC ASCENDING ANEURYSM REPAIR (AAA) Resection of ascending aorta aneurysm;  Surgeon: Rexene Alberts, MD;  Location: [facility] OR;  Service: Open Heart Surgery;  Laterality: N/A;    Social History   Socioeconomic History   Marital status: Widowed    Spouse name: Not on file   Number of children: Not on file   Years of education: Not on file   Highest education level: Not on file  Occupational History   Not on file  Tobacco Use   Smoking status: Never   Smokeless  tobacco: Never  Vaping Use   Vaping Use: Never used  Substance and Sexual Activity   Alcohol use: No   Drug use: No   Sexual activity: Not Currently  Other Topics Concern   Not on file  Social History Narrative   Not on file   Social Determinants of Health   Financial Resource Strain: Not on file  Food Insecurity: Not on file  Transportation Needs: Not on file  Physical Activity: Not on file  Stress: Not on file  Social Connections: Not on file  Intimate Partner Violence: Not on file    Family History  Problem Relation Age of Onset   Hypertension Mother    Stroke Mother    Leukemia Father    Heart attack Paternal Grandmother    Stroke Paternal Grandfather    Heart Problems Son 56   Heart Problems Son 65   Uterine cancer Sister    Prostate cancer Brother    Colon cancer Brother    Kidney failure Brother    Stroke Sister      Current Outpatient Medications:  amiodarone (PACERONE) 100 MG tablet, TAKE 1 TABLET BY MOUTH EVERY DAY, Disp: 90 tablet, Rfl: 0   amLODipine (NORVASC) 10 MG tablet, TAKE 1 TABLET BY MOUTH EVERY DAY, Disp: 90 tablet, Rfl: 0   ascorbic acid (VITAMIN C) 500 MG tablet, Take 500 mg by mouth daily., Disp: , Rfl:    aspirin EC 81 MG tablet, Take 81 mg by mouth daily., Disp: , Rfl:    atorvastatin (LIPITOR) 40 MG tablet, Take 1 tablet (40 mg total) by mouth daily., Disp: 90 tablet, Rfl: 3   cholecalciferol (VITAMIN D3) 25 MCG (1000 UT) tablet, Take 1,000 Units by mouth daily., Disp: , Rfl:    doxazosin (CARDURA) 4 MG tablet, TAKE 1 TABLET BY MOUTH EVERY DAY, Disp: 90 tablet, Rfl: 1   enalapril (VASOTEC) 10 MG tablet, Take 10 mg by mouth daily., Disp: , Rfl:    FARXIGA 10 MG TABS tablet, Take 1 tablet (10 mg total) by mouth daily., Disp: 90 tablet, Rfl: 3   ferrous sulfate 325 (65 FE) MG EC tablet, Take 325 mg by mouth daily with breakfast. , Disp: , Rfl:    hydrALAZINE (APRESOLINE) 100 MG tablet, TAKE 1 TABLET BY MOUTH THREE TIMES A DAY, Disp: 270  tablet, Rfl: 3   isosorbide mononitrate (IMDUR) 60 MG 24 hr tablet, Take 1 tablet (60 mg total) by mouth daily., Disp: 90 tablet, Rfl: 3   levothyroxine (SYNTHROID) 25 MCG tablet, Take 25 mcg by mouth every morning., Disp: , Rfl:    omeprazole (PRILOSEC) 20 MG capsule, Take 20 mg by mouth daily., Disp: , Rfl:    torsemide (DEMADEX) 10 MG tablet, TAKE 1 TABLET BY MOUTH EVERY DAY AS NEEDED, Disp: 90 tablet, Rfl: 0   nitroGLYCERIN (NITROSTAT) 0.4 MG SL tablet, Place 1 tablet (0.4 mg total) under the tongue every 5 (five) minutes as needed for chest pain. Maximum of 3 doses. (Patient not taking: Reported on 09/21/2022), Disp: 25 tablet, Rfl: 2  Physical exam:  Vitals:   09/21/22 0902  BP: (!) 125/59  Pulse: 63  Resp: 16  Temp: 97.7 F (36.5 C)  Weight: 168 lb 12.8 oz (76.6 kg)  Height: '5\' 7"'$  (1.702 m)   Physical Exam Constitutional:      General: He is not in acute distress. Cardiovascular:     Rate and Rhythm: Normal rate and regular rhythm.     Heart sounds: Murmur heard.  Pulmonary:     Effort: Pulmonary effort is normal.     Breath sounds: Normal breath sounds.  Abdominal:     General: Bowel sounds are normal.     Palpations: Abdomen is soft.  Skin:    General: Skin is warm and dry.  Neurological:     Mental Status: He is alert and oriented to person, place, and time.         Latest Ref Rng & Units 07/16/2022    1:22 PM  CMP  Glucose 70 - 99 mg/dL 108   BUN 8 - 23 mg/dL 51   Creatinine 0.61 - 1.24 mg/dL 2.93   Sodium 135 - 145 mmol/L 138   Potassium 3.5 - 5.1 mmol/L 4.1   Chloride 98 - 111 mmol/L 104   CO2 22 - 32 mmol/L 22   Calcium 8.9 - 10.3 mg/dL 8.4   Total Protein 6.5 - 8.1 g/dL 6.9   Total Bilirubin 0.3 - 1.2 mg/dL 0.4   Alkaline Phos 38 - 126 U/L 54   AST 15 - 41 U/L 20  ALT 0 - 44 U/L 16       Latest Ref Rng & Units 07/16/2022    1:22 PM  CBC  WBC 4.0 - 10.5 K/uL 6.2   Hemoglobin 13.0 - 17.0 g/dL 9.8   Hematocrit 39.0 - 52.0 % 29.2   Platelets 150  - 400 K/uL 140     Assessment and plan- Patient is a 86 y.o. male with history of anemia of chronic kidney disease and a possible component of iron deficiency here for routine follow-up  Patient's hemoglobin has been mostly between 10-11 over the last year.  Over the last 4 to 5 months it has trended down between 9.6-9.8.  Iron studies are within normal limits with a ferritin of greater than 100 and iron saturation of more than 20%.  He does not require any IV iron at this time.  If his hemoglobin trends down to less than 9 we will consider initiating EPO at that time.  Discussed risks and benefits of Kiko including all but not limited to possible risk of thromboembolic side effects.  Since his hemoglobin is close to 10 at this time I will hold off on initiating that now.  We will repeat CBC ferritin and iron studies in 6 months in 1 year and I will see him back in 1 year.  We will also check B12 and folate in 6 months   Visit Diagnosis 1. Anemia of chronic kidney failure, stage 3 (moderate) ([place])   2. Iron deficiency anemia, unspecified iron deficiency anemia type      Dr. Randa Evens, MD, MPH [facility] [phone] 09/21/2022 10:02 AM

## 2022-10-04 DIAGNOSIS — N184 Chronic kidney disease, stage 4 (severe): Secondary | ICD-10-CM | POA: Diagnosis not present

## 2022-10-04 DIAGNOSIS — D631 Anemia in chronic kidney disease: Secondary | ICD-10-CM | POA: Diagnosis not present

## 2022-10-13 ENCOUNTER — Other Ambulatory Visit: Payer: Self-pay

## 2022-10-13 MED ORDER — ISOSORBIDE MONONITRATE ER 60 MG PO TB24: 60.0000 mg | ORAL_TABLET | Freq: Every day | ORAL | 0 refills | Status: DC

## 2022-11-03 ENCOUNTER — Other Ambulatory Visit: Payer: Self-pay | Admitting: Medical

## 2022-11-12 ENCOUNTER — Other Ambulatory Visit: Payer: Self-pay | Admitting: Internal Medicine

## 2022-11-13 ENCOUNTER — Other Ambulatory Visit: Payer: Self-pay | Admitting: Internal Medicine

## 2022-11-17 ENCOUNTER — Telehealth: Payer: Self-pay | Admitting: Internal Medicine

## 2022-11-17 DIAGNOSIS — Z0001 Encounter for general adult medical examination with abnormal findings: Secondary | ICD-10-CM | POA: Diagnosis not present

## 2022-11-17 DIAGNOSIS — E782 Mixed hyperlipidemia: Secondary | ICD-10-CM | POA: Diagnosis not present

## 2022-11-17 DIAGNOSIS — Z1331 Encounter for screening for depression: Secondary | ICD-10-CM | POA: Diagnosis not present

## 2022-11-17 DIAGNOSIS — D696 Thrombocytopenia, unspecified: Secondary | ICD-10-CM | POA: Diagnosis not present

## 2022-11-17 DIAGNOSIS — Z136 Encounter for screening for cardiovascular disorders: Secondary | ICD-10-CM | POA: Diagnosis not present

## 2022-11-17 DIAGNOSIS — R5383 Other fatigue: Secondary | ICD-10-CM | POA: Diagnosis not present

## 2022-11-17 DIAGNOSIS — I712 Thoracic aortic aneurysm, without rupture, unspecified: Secondary | ICD-10-CM | POA: Diagnosis not present

## 2022-11-17 DIAGNOSIS — I493 Ventricular premature depolarization: Secondary | ICD-10-CM | POA: Diagnosis not present

## 2022-11-17 DIAGNOSIS — Z131 Encounter for screening for diabetes mellitus: Secondary | ICD-10-CM | POA: Diagnosis not present

## 2022-11-17 NOTE — Telephone Encounter (Signed)
If he is asymptomatic, I think it is reasonable for Rodney Wiley to follow-up with [place] as planned in 1 week.  He had a CT of the abdomen and pelvis (albeit without contrast) in 02/2021 that showed aortic atherosclerosis but no evidence of AAA.  If he develops any symptoms, particularly abdominal/low back/flank pain or lightheadedness, he should seek immediate medical attention.  Nelva Bush, MD [facility]

## 2022-11-17 NOTE — Telephone Encounter (Signed)
Threasa Alpha, FNP, of [facility] did a wellness check on patient today and discovered an aortic bruit in abdomen. She is aware that patient is scheduled for 12/20 with Dr. Saunders Revel. She would like to know if Dr. Saunders Revel would like to see the patient sooner to listen and determine whether a CT needs to be ordered or if vein and vascular workup may be necessary. Please advise.

## 2022-11-17 NOTE — Telephone Encounter (Signed)
Malachy Mood made aware of MD's recommendations and voiced pt is currently asymptomatic. She reports she will make pt aware of symptoms in which he will need to seek immediate care.

## 2022-11-23 NOTE — Progress Notes (Unsigned)
Follow-up Outpatient Visit Date: 11/24/2022  Primary Care Provider: Remi Haggard, [place] [address]  Chief Complaint: Follow-up aortic valve disease, thoracic aortic aneurysm, and coronary artery disease  HPI:  [name] is a 86 y.o. male with history of resistant hypertension, coronary artery disease status post CABG (3/19) aortic regurgitation and thoracic aortic aneurysm status post Bentall with bioprosthetic aortic valve replacement (3/19), SVT and frequent PACs, HFpEF, and chronic kidney disease stage 4 with questionable left renal artery stenosis, who presents for follow-up of coronary artery disease, valvular heart disease, and hypertension.  I last saw him in June, at which time he reported stable exertional dyspnea and chronic lower extremity edema.  We agreed to discontinue carvedilol due to soft blood pressure and exertional dyspnea/fatigue.  His PCP reached out to [place] last week, concerned about an abdominal bruit appreciated on her examination.  [name] was reportedly asymptomatic and denies abdominal/back pain today as well.  Today, [name] reports that he has been feeling fairly well.  He notes sporadic chest pressure that "does not bother him much."  It occurs randomly, not related to any particular activity or exertion.  He sometimes will feel it a few days in a row and then go a month or more without any discomfort.  It has been present for at least a year and does not seem to be getting worse.  There are no associated symptoms.  Chronic exertional dyspnea remains mild and unchanged from prior visits.  He feels like his leg swelling may be a little bit worse.  He is now taking his torsemide on a daily basis.  His weight has been stable.  He denies palpitations and lightheadedness.  --------------------------------------------------------------------------------------------------  Cardiovascular History & Procedures: Cardiovascular  Problems: Thoracic aortic aneurysm s/p Bentall Aortic regurgitation s/p bioprosthetic AVR Mitral regurgitation Questionable renal artery stenosis Coronary artery disease s/p CABG Supraventricular tachycardia Postoperative atrial fibrillation   Risk Factors: Known CAD, hypertension, hyperlipidemia, male gender, and age > 29   Cath/PCI: Coronary angiography/RHC (01/16/2018): LMCA normal.  LAD with sequential 20% ostial, 40% proximal, and long 70% proximal to mid stenoses.  LCx with 60% proximal stenosis and 60 to 80-90% OM2 and OM3 lesions.  Chronic total occlusion of the proximal/mid RCA with left-to-right collaterals.  RA 9, RV 43/10, PA 37/15 (22), and PCWP 18.  Fick CO/CI 5.1/2.6.   CV Surgery: CABG and Bentall with bioprosthetic AVR (02/22/2018, Dr. Roxy Manns): LIMA to LAD, SVG to OM2, and SVG to PDA.  24 mm synthetic aortic root graft and 21 mm Edwards Inspiris [place] stented bovine pericardial tissue valve.   EP Procedures and Devices: Event monitor (01/22/19): Patient was monitored for 2 days, 22 hours.  Predominantly sinus rhythm with rare PAC's and frequent PVC's.  Several brief atrial runs were noted.  PVC burden 8.4%.   Non-Invasive Evaluation(s): TTE (03/17/2021): Normal LV size with mild LVH.  LVEF 60-65%.  Indeterminate diastolic function.  Normal RV size and function.  Normal PA pressure.  Normal biatrial size.  Mild-moderate MR.  Bioprosthetic aortic valve with mean gradient 21 mmHg and AVA 1.0 cm.  Normal CVP. Renal artery Doppler (02/06/19): No evidence of significant renal artery stenosis affecting either kidney. TTE (04/07/18): Normal LV size with mild LVH.  LVEF 50 to 55% with anteroseptal and septal hypokinesis.  Grade 2 diastolic dysfunction.  Bioprosthetic aortic valve present with mean gradient of 17 mmHg.  Mild mitral regurgitation.  Normal RV size and function.  No pericardial  effusion. TEE (09/27/17): Mildly dilated LV with LVEF 60-65%. Moderate aortic regurgitation. Mild to  moderate mitral regurgitation. Mild right atrial enlargement. Moderate aortic dilation, with root measuring 5.0 cm and ascending aorta 4.7 cm MRA chest (06/06/17): Stable ascending aortic aneurysm, measuring 5.0 cm at the sinotubular junction and 4.8 cm at the level of the main pulmonary artery. Renal artery Doppler (01/13/17): Aortic atherosclerosis without stenosis or aneurysm. Normal right renal artery. Mild to moderate left renal artery stenosis (1-59%). Normal/symmetric kidney size. MRA chest (12/27/16): Aneurysmal disease of the ascending aorta, measuring 4.3 cm at the sinus of Valsalva and 4.9 cm above the sinotubular junction. TTE (12/14/16): Mildly dilated LV with mild LVH. Normal contraction with LVEF 55-60%. Grade 1 diastolic dysfunction. Mild to moderate aortic regurgitation. Dilated aorta, measuring up to 4.9 cm above the sinotubular junction. Mild to moderate mitral regurgitation. Mild left atrial enlargement. Normal RV size and function. Transthoracic echo (09/2016, [place]): Full report not available.  Reportedly showed LVH with LVEF 40% and diastolic dysfunction.  MR and moderate to severe AI present with sclerotic aortic valve. Carotid Doppler (10/201&): Full report not available.  Reportedly showed 1-39% stenosis bilaterally. Transthoracic echo (02/18/16, [facility]): Normal LV and RV size and function.  LVEF > 55%.  Moderate AI; mild MR and TR.  Recent CV Pertinent Labs: Lab Results  Component Value Date   CHOL 127 02/15/2022   HDL 38 (L) 02/15/2022   LDLCALC 63 02/15/2022   TRIG 148 02/15/2022   CHOLHDL 3.3 02/15/2022   CHOLHDL 3.8 03/11/2017   INR 2.9 05/24/2018   INR 7.53 (HH) 03/27/2018   K 4.1 07/16/2022   MG 1.9 11/19/2021   BUN 51 (H) 07/16/2022   BUN 33 (H) 11/19/2021   CREATININE 2.93 (H) 07/16/2022    Past medical and surgical history were reviewed and updated in EPIC.  Current Meds  Medication Sig   amiodarone (PACERONE) 100 MG tablet  TAKE 1 TABLET BY MOUTH EVERY DAY   amLODipine (NORVASC) 10 MG tablet TAKE 1 TABLET BY MOUTH EVERY DAY   ascorbic acid (VITAMIN C) 500 MG tablet Take 500 mg by mouth daily.   aspirin EC 81 MG tablet Take 81 mg by mouth daily.   atorvastatin (LIPITOR) 40 MG tablet TAKE 1 TABLET BY MOUTH EVERY DAY   cholecalciferol (VITAMIN D3) 25 MCG (1000 UT) tablet Take 1,000 Units by mouth daily.   doxazosin (CARDURA) 4 MG tablet TAKE 1 TABLET BY MOUTH EVERY DAY   enalapril (VASOTEC) 10 MG tablet Take 10 mg by mouth daily.   FARXIGA 10 MG TABS tablet Take 1 tablet (10 mg total) by mouth daily.   ferrous sulfate 325 (65 FE) MG EC tablet Take 325 mg by mouth daily with breakfast.    hydrALAZINE (APRESOLINE) 100 MG tablet TAKE 1 TABLET BY MOUTH THREE TIMES A DAY   isosorbide mononitrate (IMDUR) 60 MG 24 hr tablet Take 1 tablet (60 mg total) by mouth daily.   levothyroxine (SYNTHROID) 25 MCG tablet Take 25 mcg by mouth every morning.   nitroGLYCERIN (NITROSTAT) 0.4 MG SL tablet Place 1 tablet (0.4 mg total) under the tongue every 5 (five) minutes as needed for chest pain. Maximum of 3 doses.   omeprazole (PRILOSEC) 20 MG capsule Take 20 mg by mouth daily.   torsemide (DEMADEX) 10 MG tablet TAKE 1 TABLET BY MOUTH EVERY DAY AS NEEDED (Patient taking differently: Take 10 mg by mouth daily.)    Allergies: Patient has no known allergies.  Social History   Tobacco Use   Smoking status: Never   Smokeless tobacco: Never  Vaping Use   Vaping Use: Never used  Substance Use Topics   Alcohol use: No   Drug use: No    Family History  Problem Relation Age of Onset   Hypertension Mother    Stroke Mother    Leukemia Father    Heart attack Paternal Grandmother    Stroke Paternal Grandfather    Heart Problems Son 17   Heart Problems Son 55   Uterine cancer Sister    Prostate cancer Brother    Colon cancer Brother    Kidney failure Brother    Stroke Sister     Review of Systems: A 12-system review of  systems was performed and was negative except as noted in the HPI.  --------------------------------------------------------------------------------------------------  Physical Exam: BP 116/62 (BP Location: Left Arm, Patient Position: Sitting, Cuff Size: Normal)   Pulse (!) 58   Ht '5\' 7"'$  (1.702 m)   Wt 174 lb 6.4 oz (79.1 kg)   SpO2 98%   BMI 27.31 kg/m   General:  NAD. Neck: No JVD or HJR. Lungs: Clear to auscultation bilaterally without wheezes or crackles. Heart: Bradycardic but regular with 3/6 systolic murmur radiating across the precordium. Abdomen: Soft, nontender, nondistended.  Bruit heard across abdomen, most likely due to radiation of systolic heart murmur. Extremities: Trace pretibial edema.  EKG: Sinus bradycardia with first-degree AV block (PR interval 294 ms), LAFB, and poor R wave progression.  Heart rate has increased since 05/21/2022.  PR interval is slightly shorter.  Lab Results  Component Value Date   WBC 6.2 07/16/2022   HGB 9.8 (L) 07/16/2022   HCT 29.2 (L) 07/16/2022   MCV 99.3 07/16/2022   PLT 140 (L) 07/16/2022    Lab Results  Component Value Date   NA 138 07/16/2022   K 4.1 07/16/2022   CL 104 07/16/2022   CO2 22 07/16/2022   BUN 51 (H) 07/16/2022   CREATININE 2.93 (H) 07/16/2022   GLUCOSE 108 (H) 07/16/2022   ALT 16 07/16/2022    Lab Results  Component Value Date   CHOL 127 02/15/2022   HDL 38 (L) 02/15/2022   LDLCALC 63 02/15/2022   TRIG 148 02/15/2022   CHOLHDL 3.3 02/15/2022    --------------------------------------------------------------------------------------------------  ASSESSMENT AND PLAN: Coronary artery disease with atypical chest pain: Mr. Kimmons reports sporadic chest pressure that is not exertional and can sometimes happen a few days in a row and then be absent for over a month.  Symptoms have not worsened over the last year.  We have agreed to defer ischemia evaluation as long as symptoms do not worsen.  Thoracic  aortic aneurysm status post Bentall with bioprosthetic AVR and leg edema: Mr. Baranek reports NYHA class II symptoms.  He has trace pretibial edema on exam, relatively stable from prior visits.  Continue as needed torsemide and blood pressure control.  We will repeat an echocardiogram to ensure that aortic valve is functioning properly.  Continue aspirin and statin therapy as well as SBE prophylaxis.  Renal artery stenosis and abdominal bruit: Abdominal bruit is most likely due to radiation of the systolic murmur.  However, Mr. Carroll has a history of renal artery stenosis, mild on the last study in 2020.  We will repeat a renal artery Doppler as well as obtain an abdominal aortic ultrasound to ensure that he does not have aneurysmal dilation or stenosis of the abdominal aorta.  Hypertension:  Blood pressure well-controlled today.  Continue current medications.  Chronic kidney disease stage IV: Check CMP today.  Avoid nephrotoxic drugs.  SVT: No palpitations reported.  EKG today with sinus bradycardia and first-degree AV block, less pronounced than on prior tracing in 05/2022.  Continue low-dose amiodarone.  Check CBC, CMP, and TSH today.  Consider chest x-ray versus cross-sectional imaging at follow-up based on results of echocardiogram.  Hyperlipidemia: LDL well-controlled on last check in 02/2022.  Continue atorvastatin 40 mg daily for secondary prevention.  Follow-up: Return to clinic in 3 months.  Nelva Bush, MD 11/24/2022 9:32 AM

## 2022-11-24 ENCOUNTER — Other Ambulatory Visit
Admission: RE | Admit: 2022-11-24 | Discharge: 2022-11-24 | Disposition: A | Discharge status: Home / Self Care | Payer: Medicare HMO | Source: Ambulatory Visit | Attending: Internal Medicine | Admitting: Internal Medicine

## 2022-11-24 ENCOUNTER — Encounter: Payer: Self-pay | Admitting: Internal Medicine

## 2022-11-24 ENCOUNTER — Ambulatory Visit: Payer: Medicare HMO | Attending: Internal Medicine | Admitting: Internal Medicine

## 2022-11-24 VITALS — BP 116/62 | HR 58 | Ht 67.0 in | Wt 174.4 lb

## 2022-11-24 DIAGNOSIS — I25119 Atherosclerotic heart disease of native coronary artery with unspecified angina pectoris: Secondary | ICD-10-CM

## 2022-11-24 DIAGNOSIS — I7121 Aneurysm of the ascending aorta, without rupture: Secondary | ICD-10-CM

## 2022-11-24 DIAGNOSIS — Z952 Presence of prosthetic heart valve: Secondary | ICD-10-CM

## 2022-11-24 DIAGNOSIS — I471 Supraventricular tachycardia, unspecified: Secondary | ICD-10-CM

## 2022-11-24 DIAGNOSIS — I701 Atherosclerosis of renal artery: Secondary | ICD-10-CM

## 2022-11-24 DIAGNOSIS — I10 Essential (primary) hypertension: Secondary | ICD-10-CM | POA: Diagnosis not present

## 2022-11-24 DIAGNOSIS — R0989 Other specified symptoms and signs involving the circulatory and respiratory systems: Secondary | ICD-10-CM | POA: Diagnosis not present

## 2022-11-24 DIAGNOSIS — Z79899 Other long term (current) drug therapy: Secondary | ICD-10-CM | POA: Diagnosis not present

## 2022-11-24 DIAGNOSIS — R600 Localized edema: Secondary | ICD-10-CM | POA: Insufficient documentation

## 2022-11-24 DIAGNOSIS — N184 Chronic kidney disease, stage 4 (severe): Secondary | ICD-10-CM | POA: Diagnosis not present

## 2022-11-24 LAB — CBC
HCT: 32.1 % — ABNORMAL LOW (ref 39.0–52.0)
Hemoglobin: 10.4 g/dL — ABNORMAL LOW (ref 13.0–17.0)
MCH: 31.8 pg (ref 26.0–34.0)
MCHC: 32.4 g/dL (ref 30.0–36.0)
MCV: 98.2 fL (ref 80.0–100.0)
Platelets: 149 10*3/uL — ABNORMAL LOW (ref 150–400)
RBC: 3.27 MIL/uL — ABNORMAL LOW (ref 4.22–5.81)
RDW: 13.6 % (ref 11.5–15.5)
WBC: 6.5 10*3/uL (ref 4.0–10.5)
nRBC: 0 % (ref 0.0–0.2)

## 2022-11-24 LAB — COMPREHENSIVE METABOLIC PANEL
ALT: 16 U/L (ref 0–44)
AST: 23 U/L (ref 15–41)
Albumin: 4.1 g/dL (ref 3.5–5.0)
Alkaline Phosphatase: 60 U/L (ref 38–126)
Anion gap: 10 (ref 5–15)
BUN: 40 mg/dL — ABNORMAL HIGH (ref 8–23)
CO2: 22 mmol/L (ref 22–32)
Calcium: 8.9 mg/dL (ref 8.9–10.3)
Chloride: 108 mmol/L (ref 98–111)
Creatinine, Ser: 2.77 mg/dL — ABNORMAL HIGH (ref 0.61–1.24)
GFR, Estimated: 22 mL/min — ABNORMAL LOW (ref >60)
Glucose, Bld: 124 mg/dL — ABNORMAL HIGH (ref 70–99)
Potassium: 4.3 mmol/L (ref 3.5–5.1)
Sodium: 140 mmol/L (ref 135–145)
Total Bilirubin: 1.2 mg/dL (ref 0.3–1.2)
Total Protein: 7.5 g/dL (ref 6.5–8.1)

## 2022-11-24 LAB — TSH: TSH: 4.469 u[IU]/mL (ref 0.350–4.500)

## 2022-11-24 NOTE — Patient Instructions (Signed)
Medication Instructions:  Your Physician recommend you continue on your current medication as directed.    *If you need a refill on your cardiac medications before your next appointment, please call your pharmacy*   Lab Work: Your provider would like for you to have following labs drawn: (CBC, CMP, Lipid).   Please go to the [facility] entrance and check in at the front desk.  You do not need an appointment.  They are open from 7am-6 pm.   If you have labs (blood work) drawn today and your tests are completely normal, you will receive your results only by: [place] (if you have MyChart) OR A paper copy in the mail If you have any lab test that is abnormal or we need to change your treatment, we will call you to review the results.   Testing/Procedures: Your physician has requested that you have an echocardiogram. Echocardiography is a painless test that uses sound waves to create images of your heart. It provides your doctor with information about the size and shape of your heart and how well your heart's chambers and valves are working.   You may receive an ultrasound enhancing agent through an IV if needed to better visualize your heart during the echo. This procedure takes approximately one hour.  There are no restrictions for this procedure.  This will take place at [place] ([place]) [address], [place]  Your physician has requested that you have a renal artery duplex. During this test, an ultrasound is used to evaluate blood flow to the kidneys. Take your medications as you usually do. This will take place at [place] ([place]) [address], [place].  No food after 11PM the night before.  Water is OK. (Don't drink liquids if you have been instructed not to for ANOTHER test). Avoid foods that produce bowel gas, for 24 hours prior to exam (see below). No breakfast, no chewing gum, no smoking or carbonated  beverages. Patient may take morning medications with water. Come in for test at least 15 minutes early to register.  Your physician has requested that you have an abdominal aorta duplex. During this test, an ultrasound is used to evaluate the aorta. Allow 30 minutes for this exam. Do not eat after midnight the day before and avoid carbonated beverages  This will take place at [employer] ([place]) [address], [place].  Follow-Up: At [facility], you and your health needs are our priority.  As part of our continuing mission to provide you with exceptional heart care, we have created designated Provider Care Teams.  These Care Teams include your primary Cardiologist (physician) and Advanced Practice Providers (APPs -  Physician Assistants and Nurse Practitioners) who all work together to provide you with the care you need, when you need it.  We recommend signing up for the patient portal called "MyChart".  Sign up information is provided on this After Visit Summary.  MyChart is used to connect with patients for Virtual Visits (Telemedicine).  Patients are able to view lab/test results, encounter notes, upcoming appointments, etc.  Non-urgent messages can be sent to your provider as well.   To learn more about what you can do with MyChart, go to [email or web address].    Your next appointment:   6 month(s)  The format for your next appointment:   In Person  Provider:   You may see Nelva Bush, MD or one of the following  Advanced Practice Providers on your designated Care Team:   Murray Hodgkins, NP Christell Faith, PA-C Cadence Kathlen Mody, PA-C Gerrie Nordmann, NP

## 2022-12-01 DIAGNOSIS — Z131 Encounter for screening for diabetes mellitus: Secondary | ICD-10-CM | POA: Diagnosis not present

## 2022-12-01 DIAGNOSIS — Z0001 Encounter for general adult medical examination with abnormal findings: Secondary | ICD-10-CM | POA: Diagnosis not present

## 2022-12-01 DIAGNOSIS — I493 Ventricular premature depolarization: Secondary | ICD-10-CM | POA: Diagnosis not present

## 2022-12-01 DIAGNOSIS — I712 Thoracic aortic aneurysm, without rupture, unspecified: Secondary | ICD-10-CM | POA: Diagnosis not present

## 2022-12-01 DIAGNOSIS — R5383 Other fatigue: Secondary | ICD-10-CM | POA: Diagnosis not present

## 2022-12-01 DIAGNOSIS — Z1331 Encounter for screening for depression: Secondary | ICD-10-CM | POA: Diagnosis not present

## 2022-12-01 DIAGNOSIS — E782 Mixed hyperlipidemia: Secondary | ICD-10-CM | POA: Diagnosis not present

## 2022-12-01 DIAGNOSIS — Z136 Encounter for screening for cardiovascular disorders: Secondary | ICD-10-CM | POA: Diagnosis not present

## 2022-12-01 DIAGNOSIS — D696 Thrombocytopenia, unspecified: Secondary | ICD-10-CM | POA: Diagnosis not present

## 2022-12-07 ENCOUNTER — Other Ambulatory Visit: Payer: Self-pay | Admitting: Internal Medicine

## 2022-12-09 ENCOUNTER — Encounter: Payer: Self-pay | Admitting: Family Medicine

## 2022-12-16 ENCOUNTER — Other Ambulatory Visit: Payer: Self-pay | Admitting: Internal Medicine

## 2022-12-16 DIAGNOSIS — I471 Supraventricular tachycardia, unspecified: Secondary | ICD-10-CM

## 2022-12-16 DIAGNOSIS — I440 Atrioventricular block, first degree: Secondary | ICD-10-CM

## 2022-12-16 DIAGNOSIS — I491 Atrial premature depolarization: Secondary | ICD-10-CM

## 2022-12-20 ENCOUNTER — Other Ambulatory Visit: Payer: Self-pay | Admitting: Internal Medicine

## 2022-12-20 ENCOUNTER — Other Ambulatory Visit: Payer: Self-pay | Admitting: Medical

## 2023-01-03 ENCOUNTER — Telehealth: Payer: Self-pay | Admitting: *Deleted

## 2023-01-03 NOTE — Telephone Encounter (Signed)
Called pt to let him know that the labs done - he does not neeed IV iron. I asked how he feels and he said that he is great. No concerns he said

## 2023-01-17 ENCOUNTER — Other Ambulatory Visit: Payer: Self-pay

## 2023-01-17 ENCOUNTER — Other Ambulatory Visit: Payer: Medicare HMO

## 2023-01-17 DIAGNOSIS — N400 Enlarged prostate without lower urinary tract symptoms: Secondary | ICD-10-CM

## 2023-01-17 DIAGNOSIS — R972 Elevated prostate specific antigen [PSA]: Secondary | ICD-10-CM

## 2023-01-18 ENCOUNTER — Other Ambulatory Visit: Payer: Medicare HMO

## 2023-01-18 LAB — PSA: Prostate Specific Ag, Serum: 27.4 ng/mL — ABNORMAL HIGH (ref 0.0–4.0)

## 2023-01-19 ENCOUNTER — Other Ambulatory Visit: Payer: Medicare HMO

## 2023-01-19 ENCOUNTER — Ambulatory Visit: Payer: Medicare HMO | Admitting: Oncology

## 2023-01-20 ENCOUNTER — Ambulatory Visit: Payer: Medicare HMO | Admitting: Urology

## 2023-01-21 ENCOUNTER — Encounter: Payer: Self-pay | Admitting: Urology

## 2023-01-21 ENCOUNTER — Ambulatory Visit: Payer: Medicare HMO | Admitting: Urology

## 2023-01-21 VITALS — BP 122/70 | HR 81 | Ht 67.0 in | Wt 172.0 lb

## 2023-01-21 DIAGNOSIS — R972 Elevated prostate specific antigen [PSA]: Secondary | ICD-10-CM | POA: Diagnosis not present

## 2023-01-21 LAB — URINALYSIS, COMPLETE
Bilirubin, UA: NEGATIVE
Glucose, UA: NEGATIVE
Ketones, UA: NEGATIVE
Nitrite, UA: NEGATIVE
RBC, UA: NEGATIVE
Specific Gravity, UA: 1.01 (ref 1.005–1.030)
Urobilinogen, Ur: 0.2 mg/dL (ref 0.2–1.0)
pH, UA: 5 (ref 5.0–7.5)

## 2023-01-21 LAB — MICROSCOPIC EXAMINATION

## 2023-01-21 NOTE — Progress Notes (Signed)
01/21/2023 1:19 PM   Rodney Wiley 1936/11/10 [id number]  Referring provider: Remi Haggard, [address] [place],  [place] [address]  Chief Complaint  Patient presents with   Benign Prostatic Hypertrophy    HPI: 87 y.o. male presents for annual follow-up.  Initially seen by me 09/2020 for a PSA in the low 7 range Hospitalized for UTI October 2021 and PSA was 23.6 in April 2022 Repeat follow-up PSA recommended which was repeated July 2022 and was 12.6 Repeat PSA performed in January 2023 stable at 12.2 but still elevated above baseline  Interval history: Follow-up PSA August 2023 was stable 11.3 Repeat PSA 01/17/2023 increased to 27.4 No bothersome LUTS, dysuria or gross hematuria Denies flank, abdominal or pelvic pain   PMH: Past Medical History:  Diagnosis Date   Anemia    Aortic insufficiency    a. 02/2018 s/p bioprosthetic AVR; 04/2018 Echo: EF 50-55%, Gr2 DD, Ao bioprosthesis, mean grad 42mHg, Ao root/Asc Ao nl in size, mild MR, mildly dil LA, nl RV fxn. Nl PASP.   Arthropathy    Ascending aortic aneurysm ([facility]    a. 12/2016 MRA: 4.3cm @ sinus of valsalva, 4.9cm above sinotubular jxn; b. 02/2018 s/p biological Bentall and AVR.   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 02/2018 Cath: LAD 20ost, 40p, 767mLCX 60p, OM2 60, OM3 85, RCA 10p/m w/ L->R and R->R collats;  b. 02/2018 CABG x 3: LIMA to LAD, SVG to OM2, SVG to PDA, EVH via right thigh.   CKD (chronic kidney disease), stage III (HCC)    Diverticulitis    Essential hypertension    GERD (gastroesophageal reflux disease)    Hemorrhoids    History of chicken pox    History of Helicobacter pylori infection 06/2007   Hyperlipidemia    Hypertension    Left renal artery stenosis (HCC)    a. 01/2017 RA u/s: moderate L RAS.   Lower extremity edema    Nonrheumatic mitral (valve) insufficiency    a. 12/2016 Echo: mild to mod MR; b. 09/2017 TEE: mild to mod MR; c. 04/2018 Echo:  Mild MR.   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 s/p 21 mm Edwards Inspiris Resilia stented bovine pericardial tissue valve and 24 mm Gelweave Valsalva synthetic root conduit with reimplantation of left main coronary artery.    Surgical History: Past Surgical History:  Procedure Laterality Date   AORTIC VALVE REPAIR N/A 02/22/2018   Procedure: AORTIC VALVE REPLACEMENT -Biological Bentall aortic root replacement,;  Surgeon: OwRexene AlbertsMD;  [facility] Service: Open Heart Surgery;  Laterality: N/A;   bypass surgery     CARDIAC CATHETERIZATION     01/16/2018   COLONOSCOPY  2011   by Dr. OhCandace Cruiseith findings of diverticulosis   CORONARY ARTERY BYPASS GRAFT N/A 02/22/2018   Procedure: CORONARY ARTERY BYPASS GRAFTING (CABG) x three, using left internal mammary artery and right leg greater saphenous vein harvested endoscopically;  Surgeon: OwRexene AlbertsMD;  [facility] Service: Open [facility] Surgery;  Laterality: N/A;   CORONARY/GRAFT ANGIOGRAPHY N/A 01/16/2018   Procedure: CORONARY/GRAFT ANGIOGRAPHY;  Surgeon: EnNelva BushMD;  [facility];  Service: Cardiovascular;  Laterality: N/A;   ELBOW SURGERY Left    hemorhoidectomy     RIGHT HEART CATH N/A 01/16/2018  Procedure: RIGHT HEART CATH;  Surgeon: Nelva Bush, MD;  Location: [place] [facility];  Service: Cardiovascular;  Laterality: N/A;   SHOULDER ARTHROSCOPY WITH ROTATOR CUFF REPAIR Right    SHOULDER SURGERY Right    TEE WITHOUT CARDIOVERSION N/A 09/27/2017   Procedure: TRANSESOPHAGEAL ECHOCARDIOGRAM (TEE);  Surgeon: Dorothy Spark, MD;  Location[facility] ENDOSCOPY;  Service: Cardiovascular;  Laterality: N/A;   TEE WITHOUT CARDIOVERSION N/A 02/22/2018   Procedure: TRANSESOPHAGEAL ECHOCARDIOGRAM (TEE);  Surgeon: Rexene Alberts, MD;  Location: [place];  Service: Open Heart Surgery;   Laterality: N/A;   THORACIC AORTIC ANEURYSM REPAIR N/A 02/22/2018   Procedure: THORACIC ASCENDING ANEURYSM REPAIR (AAA) Resection of ascending aorta aneurysm;  Surgeon: Rexene Alberts, MD;  Location: [facility] OR;  Service: Open Heart Surgery;  Laterality: N/A;    Home Medications:  Allergies as of 01/21/2023   No Known Allergies      Medication List        Accurate as of January 21, 2023  1:19 PM. If you have any questions, ask your nurse or doctor.          amiodarone 100 MG tablet Commonly known as: PACERONE TAKE 1 TABLET BY MOUTH EVERY DAY   amLODipine 10 MG tablet Commonly known as: NORVASC TAKE 1 TABLET BY MOUTH EVERY DAY   ascorbic acid 500 MG tablet Commonly known as: VITAMIN C Take 500 mg by mouth daily.   aspirin EC 81 MG tablet Take 81 mg by mouth daily.   atorvastatin 40 MG tablet Commonly known as: LIPITOR TAKE 1 TABLET BY MOUTH EVERY DAY   cholecalciferol 25 MCG (1000 UNIT) tablet Commonly known as: VITAMIN D3 Take 1,000 Units by mouth daily.   doxazosin 4 MG tablet Commonly known as: CARDURA TAKE 1 TABLET BY MOUTH EVERY DAY   enalapril 10 MG tablet Commonly known as: VASOTEC Take 10 mg by mouth daily.   Farxiga 10 MG Tabs tablet Generic drug: dapagliflozin propanediol Take 1 tablet (10 mg total) by mouth daily.   ferrous sulfate 325 (65 FE) MG EC tablet Take 325 mg by mouth daily with breakfast.   hydrALAZINE 100 MG tablet Commonly known as: APRESOLINE TAKE 1 TABLET BY MOUTH THREE TIMES A DAY   isosorbide mononitrate 60 MG 24 hr tablet Commonly known as: IMDUR TAKE 1 TABLET BY MOUTH EVERY DAY   levothyroxine 25 MCG tablet Commonly known as: SYNTHROID Take 25 mcg by mouth every morning.   nitroGLYCERIN 0.4 MG SL tablet Commonly known as: Nitrostat Place 1 tablet (0.4 mg total) under the tongue every 5 (five) minutes as needed for chest pain. Maximum of 3 doses.   omeprazole 20 MG capsule Commonly known as: PRILOSEC Take 20 mg by  mouth daily.   torsemide 10 MG tablet Commonly known as: DEMADEX TAKE 1 TABLET BY MOUTH EVERY DAY AS NEEDED        Allergies: No Known Allergies  Family History: Family History  Problem Relation Age of Onset   Hypertension Mother    Stroke Mother    Leukemia Father    Heart attack Paternal Grandmother    Stroke Paternal Grandfather    Heart Problems Son 39   Heart Problems Son 21   Uterine cancer Sister    Prostate cancer Brother    Colon cancer Brother    Kidney failure Brother    Stroke Sister     Social History:  reports that he has never smoked. He has never used smokeless tobacco. He reports  that he does not drink alcohol and does not use drugs.   Physical Exam: BP 122/70   Pulse 81   Ht 5' 7"$  (1.702 m)   Wt 172 lb (78 kg)   BMI 26.94 kg/m   Constitutional:  Alert and oriented, No acute distress. GU: Prostate 60+ cc, smooth without nodules Psychiatric: Normal mood and affect.   Assessment & Plan:    1.  Elevated PSA Recent PSA significantly increased from baseline at 27.4 Benign DRE Urinalysis shows no evidence of infection He has elected to proceed with prostate MRI to assess for any evidence of abnormality suspicious for high-grade cancer   Abbie Sons, MD  [place] [address] [place], [place] [address] [phone]

## 2023-01-22 ENCOUNTER — Encounter: Payer: Self-pay | Admitting: Urology

## 2023-01-29 ENCOUNTER — Other Ambulatory Visit: Payer: Self-pay | Admitting: Internal Medicine

## 2023-02-02 ENCOUNTER — Encounter: Payer: Self-pay | Admitting: Urology

## 2023-02-04 ENCOUNTER — Other Ambulatory Visit: Payer: Self-pay | Admitting: Urology

## 2023-02-04 DIAGNOSIS — R972 Elevated prostate specific antigen [PSA]: Secondary | ICD-10-CM

## 2023-02-11 ENCOUNTER — Ambulatory Visit: Payer: Medicare HMO | Attending: Internal Medicine

## 2023-02-11 ENCOUNTER — Ambulatory Visit (INDEPENDENT_AMBULATORY_CARE_PROVIDER_SITE_OTHER): Payer: Medicare HMO

## 2023-02-11 ENCOUNTER — Ambulatory Visit: Payer: Medicare HMO

## 2023-02-11 DIAGNOSIS — Z952 Presence of prosthetic heart valve: Secondary | ICD-10-CM | POA: Diagnosis not present

## 2023-02-11 DIAGNOSIS — R600 Localized edema: Secondary | ICD-10-CM | POA: Diagnosis not present

## 2023-02-11 DIAGNOSIS — I701 Atherosclerosis of renal artery: Secondary | ICD-10-CM

## 2023-02-12 LAB — ECHOCARDIOGRAM COMPLETE
AR max vel: 1.35 cm2
AV Area VTI: 1.41 cm2
AV Area mean vel: 1.35 cm2
AV Mean grad: 18.7 mmHg
AV Peak grad: 34.9 mmHg
Ao pk vel: 2.95 m/s
Area-P 1/2: 3.03 cm2
Calc EF: 54.6 %
S' Lateral: 2.9 cm
Single Plane A2C EF: 53.1 %
Single Plane A4C EF: 56.4 %

## 2023-02-17 ENCOUNTER — Encounter: Payer: Self-pay | Admitting: Internal Medicine

## 2023-03-07 ENCOUNTER — Ambulatory Visit
Admission: RE | Admit: 2023-03-07 | Discharge: 2023-03-07 | Disposition: A | Discharge status: Home / Self Care | Payer: Medicare HMO | Source: Ambulatory Visit | Attending: Urology | Admitting: Urology

## 2023-03-07 DIAGNOSIS — R972 Elevated prostate specific antigen [PSA]: Secondary | ICD-10-CM | POA: Diagnosis not present

## 2023-03-07 MED ORDER — GADOBUTROL 1 MMOL/ML IV SOLN
7.5000 mL | Freq: Once | INTRAVENOUS | Status: AC | PRN
  Administered 2023-03-07: 7.5 mL via INTRAVENOUS

## 2023-03-08 ENCOUNTER — Encounter: Payer: Self-pay | Admitting: Urology

## 2023-03-16 ENCOUNTER — Other Ambulatory Visit: Payer: Self-pay | Admitting: Internal Medicine

## 2023-03-17 ENCOUNTER — Other Ambulatory Visit: Payer: Self-pay | Admitting: Internal Medicine

## 2023-03-17 DIAGNOSIS — I491 Atrial premature depolarization: Secondary | ICD-10-CM

## 2023-03-17 DIAGNOSIS — I440 Atrioventricular block, first degree: Secondary | ICD-10-CM

## 2023-03-17 DIAGNOSIS — I471 Supraventricular tachycardia, unspecified: Secondary | ICD-10-CM

## 2023-03-22 ENCOUNTER — Other Ambulatory Visit: Payer: Self-pay | Admitting: *Deleted

## 2023-03-22 DIAGNOSIS — N183 Chronic kidney disease, stage 3 unspecified: Secondary | ICD-10-CM

## 2023-03-22 DIAGNOSIS — D631 Anemia in chronic kidney disease: Secondary | ICD-10-CM

## 2023-03-23 ENCOUNTER — Inpatient Hospital Stay: Payer: Medicare HMO | Attending: Oncology

## 2023-03-23 DIAGNOSIS — D509 Iron deficiency anemia, unspecified: Secondary | ICD-10-CM

## 2023-03-23 DIAGNOSIS — D631 Anemia in chronic kidney disease: Secondary | ICD-10-CM | POA: Diagnosis not present

## 2023-03-23 DIAGNOSIS — N183 Chronic kidney disease, stage 3 unspecified: Secondary | ICD-10-CM | POA: Insufficient documentation

## 2023-03-23 DIAGNOSIS — E611 Iron deficiency: Secondary | ICD-10-CM | POA: Insufficient documentation

## 2023-03-23 LAB — CBC WITH DIFFERENTIAL/PLATELET
Abs Immature Granulocytes: 0.02 10*3/uL (ref 0.00–0.07)
Basophils Absolute: 0 10*3/uL (ref 0.0–0.1)
Basophils Relative: 0 %
Eosinophils Absolute: 0.3 10*3/uL (ref 0.0–0.5)
Eosinophils Relative: 5 %
HCT: 30.4 % — ABNORMAL LOW (ref 39.0–52.0)
Hemoglobin: 9.9 g/dL — ABNORMAL LOW (ref 13.0–17.0)
Immature Granulocytes: 0 %
Lymphocytes Relative: 18 %
Lymphs Abs: 1 10*3/uL (ref 0.7–4.0)
MCH: 32.2 pg (ref 26.0–34.0)
MCHC: 32.6 g/dL (ref 30.0–36.0)
MCV: 99 fL (ref 80.0–100.0)
Monocytes Absolute: 0.4 10*3/uL (ref 0.1–1.0)
Monocytes Relative: 7 %
Neutro Abs: 3.8 10*3/uL (ref 1.7–7.7)
Neutrophils Relative %: 70 %
Platelets: 166 10*3/uL (ref 150–400)
RBC: 3.07 MIL/uL — ABNORMAL LOW (ref 4.22–5.81)
RDW: 14.2 % (ref 11.5–15.5)
WBC: 5.5 10*3/uL (ref 4.0–10.5)
nRBC: 0 % (ref 0.0–0.2)

## 2023-03-23 LAB — IRON AND TIBC
Iron: 148 ug/dL (ref 45–182)
Saturation Ratios: 54 % — ABNORMAL HIGH (ref 17.9–39.5)
TIBC: 274 ug/dL (ref 250–450)
UIBC: 126 ug/dL

## 2023-03-23 LAB — VITAMIN B12: Vitamin B-12: 306 pg/mL (ref 180–914)

## 2023-03-23 LAB — FERRITIN: Ferritin: 172 ng/mL (ref 24–336)

## 2023-03-24 ENCOUNTER — Encounter: Payer: Self-pay | Admitting: Oncology

## 2023-04-27 ENCOUNTER — Other Ambulatory Visit: Payer: Self-pay | Admitting: Internal Medicine

## 2023-04-29 ENCOUNTER — Other Ambulatory Visit: Payer: Self-pay | Admitting: *Deleted

## 2023-04-29 DIAGNOSIS — D631 Anemia in chronic kidney disease: Secondary | ICD-10-CM

## 2023-04-29 DIAGNOSIS — D509 Iron deficiency anemia, unspecified: Secondary | ICD-10-CM

## 2023-05-26 ENCOUNTER — Encounter: Payer: Self-pay | Admitting: Internal Medicine

## 2023-05-26 ENCOUNTER — Ambulatory Visit: Payer: Medicare HMO | Attending: Internal Medicine | Admitting: Internal Medicine

## 2023-05-26 VITALS — BP 130/70 | HR 66 | Ht 68.0 in | Wt 168.0 lb

## 2023-05-26 DIAGNOSIS — I25118 Atherosclerotic heart disease of native coronary artery with other forms of angina pectoris: Secondary | ICD-10-CM | POA: Diagnosis not present

## 2023-05-26 DIAGNOSIS — I5032 Chronic diastolic (congestive) heart failure: Secondary | ICD-10-CM

## 2023-05-26 DIAGNOSIS — I471 Supraventricular tachycardia, unspecified: Secondary | ICD-10-CM

## 2023-05-26 DIAGNOSIS — E785 Hyperlipidemia, unspecified: Secondary | ICD-10-CM

## 2023-05-26 DIAGNOSIS — I10 Essential (primary) hypertension: Secondary | ICD-10-CM

## 2023-05-26 DIAGNOSIS — I7121 Aneurysm of the ascending aorta, without rupture: Secondary | ICD-10-CM | POA: Diagnosis not present

## 2023-05-26 DIAGNOSIS — I774 Celiac artery compression syndrome: Secondary | ICD-10-CM

## 2023-05-26 DIAGNOSIS — N184 Chronic kidney disease, stage 4 (severe): Secondary | ICD-10-CM

## 2023-05-26 DIAGNOSIS — I771 Stricture of artery: Secondary | ICD-10-CM

## 2023-05-26 NOTE — Patient Instructions (Signed)
Medication Instructions:  Your physician recommends that you continue on your current medications as directed. Please refer to the Current Medication list given to you today.  Dr. Okey Dupre recommends minimizing the use of Ibuprofen and use tylenol.   *If you need a refill on your cardiac medications before your next appointment, please call your pharmacy*   Lab Work: None ordered today   Testing/Procedures: None ordered today   Follow-Up: At [facility], you and your health needs are our priority.  As part of our continuing mission to provide you with exceptional heart care, we have created designated Provider Care Teams.  These Care Teams include your primary Cardiologist (physician) and Advanced Practice Providers (APPs -  Physician Assistants and Nurse Practitioners) who all work together to provide you with the care you need, when you need it.  We recommend signing up for the patient portal called "MyChart".  Sign up information is provided on this After Visit Summary.  MyChart is used to connect with patients for Virtual Visits (Telemedicine).  Patients are able to view lab/test results, encounter notes, upcoming appointments, etc.  Non-urgent messages can be sent to your provider as well.   To learn more about what you can do with MyChart, go to [email or web address].    Your next appointment:   6 month(s)  Provider:   You may see Yvonne Kendall, MD or one of the following Advanced Practice Providers on your designated Care Team:   Nicolasa Ducking, NP Eula Listen, PA-C Cadence Fransico Michael, PA-C Charlsie Quest, NP

## 2023-05-26 NOTE — Progress Notes (Signed)
Follow-up Outpatient Visit Date: 05/26/2023  Primary Care Provider: Armando Gang, FNP [address] [place] [address]  Chief Complaint: Follow-up coronary artery disease, aortic/aortic valve disease, and HFpEF  HPI:  Mr. Valderrama is a 87 y.o. male with history of resistant hypertension, coronary artery disease status post CABG (3/19) aortic regurgitation and thoracic aortic aneurysm status post Bentall with bioprosthetic aortic valve replacement (3/19), SVT and frequent PACs, HFpEF, and chronic kidney disease stage 4 with questionable left renal artery stenosis, who presents for follow-up of coronary artery disease, aortic/aortic valve disease, and hypertension.  I last saw him in 11/2022, at which time Mr. Magnon was feeling well other than sporadic chest pressure that would occur randomly and had been stable for years.  Chronic exertional dyspnea was mild and unchanged.  We agreed to repeat an echocardiogram and renal artery Doppler.  Echocardiogram showed normal LVEF with grade 1 diastolic dysfunction.  Aortic valve prosthesis appeared to be functioning properly.  Renal artery Doppler showed no evidence of significant renal artery stenosis.  Incidental note was made of 70-99% stenosis in the celiac artery.  Today, Mr. Suguitan feels fairly well though he has been bothered quite a bit by bilateral knee pain.  He received steroid injections but experienced only a few days of pain relief.  He is now taking ibuprofen at least twice a day for pain control, as he states that acetaminophen is less effective.  He has not had any chest pain.  Chronic exertional dyspnea may be a little bit worse than a year ago, though he wonders if deconditioning related to being less active on account of his knee pain is contributing to this.  He still tries to work in the yard when possible.  He has waxing and waning edema, which he thinks has been a little worse over the last week due to the warm  temperatures.  He saw his nephrologist last month, who recommended using torsemide only as needed rather than on a standing basis due to worsening renal failure.  He has been taking it every other day for the last week or 2.  He denies palpitations and lightheadedness.  Amlodipine was also decreased from 10 mg daily to 5 mg daily at his nephrology visit last month due to soft blood pressure.  He does not have any nausea or abdominal pain (postprandial or otherwise).  --------------------------------------------------------------------------------------------------  Cardiovascular History & Procedures: Cardiovascular Problems: Thoracic aortic aneurysm s/p Bentall Aortic regurgitation s/p bioprosthetic AVR Mitral regurgitation Questionable renal artery stenosis Coronary artery disease s/p CABG Supraventricular tachycardia Postoperative atrial fibrillation Celiac artery stenosis   Risk Factors: Known CAD, hypertension, hyperlipidemia, male gender, and age > 33   Cath/PCI: Coronary angiography/RHC (01/16/2018): LMCA normal.  LAD with sequential 20% ostial, 40% proximal, and long 70% proximal to mid stenoses.  LCx with 60% proximal stenosis and 60 to 80-90% OM2 and OM3 lesions.  Chronic total occlusion of the proximal/mid RCA with left-to-right collaterals.  RA 9, RV 43/10, PA 37/15 (22), and PCWP 18.  Fick CO/CI 5.1/2.6.   CV Surgery: CABG and Bentall with bioprosthetic AVR (02/22/2018, Dr. Cornelius Moras): LIMA to LAD, SVG to OM2, and SVG to PDA.  24 mm synthetic aortic root graft and 21 mm Edwards Inspiris Roselia stented bovine pericardial tissue valve.   EP Procedures and Devices: Event monitor (01/22/19): Patient was monitored for 2 days, 22 hours.  Predominantly sinus rhythm with rare PAC's and frequent PVC's.  Several brief atrial runs were noted.  PVC burden  8.4%.   Non-Invasive Evaluation(s): TTE (02/11/2023): Normal LV size with mild LVH.  LVEF 55-60% with septal hypokinesis possibly due to  postoperative state.  Grade 1 diastolic dysfunction noted.  Normal RV size and function.  Moderate left atrial enlargement.  Aortic bioprosthesis present with mean gradient 19 mmHg.  Normal CVP. Renal artery Doppler (02/11/2023): No significant stenosis in either renal artery.  Incidental note made of 70-99% stenosis in the celiac artery. TTE (03/17/2021): Normal LV size with mild LVH.  LVEF 60-65%.  Indeterminate diastolic function.  Normal RV size and function.  Normal PA pressure.  Normal biatrial size.  Mild-moderate MR.  Bioprosthetic aortic valve with mean gradient 21 mmHg and AVA 1.0 cm.  Normal CVP. Renal artery Doppler (02/06/19): No evidence of significant renal artery stenosis affecting either kidney. TTE (04/07/18): Normal LV size with mild LVH.  LVEF 50 to 55% with anteroseptal and septal hypokinesis.  Grade 2 diastolic dysfunction.  Bioprosthetic aortic valve present with mean gradient of 17 mmHg.  Mild mitral regurgitation.  Normal RV size and function.  No pericardial effusion. TEE (09/27/17): Mildly dilated LV with LVEF 60-65%. Moderate aortic regurgitation. Mild to moderate mitral regurgitation. Mild right atrial enlargement. Moderate aortic dilation, with root measuring 5.0 cm and ascending aorta 4.7 cm MRA chest (06/06/17): Stable ascending aortic aneurysm, measuring 5.0 cm at the sinotubular junction and 4.8 cm at the level of the main pulmonary artery. Renal artery Doppler (01/13/17): Aortic atherosclerosis without stenosis or aneurysm. Normal right renal artery. Mild to moderate left renal artery stenosis (1-59%). Normal/symmetric kidney size. MRA chest (12/27/16): Aneurysmal disease of the ascending aorta, measuring 4.3 cm at the sinus of Valsalva and 4.9 cm above the sinotubular junction. TTE (12/14/16): Mildly dilated LV with mild LVH. Normal contraction with LVEF 55-60%. Grade 1 diastolic dysfunction. Mild to moderate aortic regurgitation. Dilated aorta, measuring up to 4.9 cm above the  sinotubular junction. Mild to moderate mitral regurgitation. Mild left atrial enlargement. Normal RV size and function. Transthoracic echo (09/2016, [facility]): Full report not available.  Reportedly showed LVH with LVEF 62% and diastolic dysfunction.  MR and moderate to severe AI present with sclerotic aortic valve. Carotid Doppler (10/201&): Full report not available.  Reportedly showed 1-39% stenosis bilaterally. Transthoracic echo (02/18/16, [facility]): Normal LV and RV size and function.  LVEF > 55%.  Moderate AI; mild MR and TR.  Recent CV Pertinent Labs: Lab Results  Component Value Date   CHOL 127 02/15/2022   HDL 38 (L) 02/15/2022   LDLCALC 63 02/15/2022   TRIG 148 02/15/2022   CHOLHDL 3.3 02/15/2022   CHOLHDL 3.8 03/11/2017   INR 2.9 05/24/2018   INR 7.53 (HH) 03/27/2018   K 4.3 11/24/2022   MG 1.9 11/19/2021   BUN 40 (H) 11/24/2022   BUN 33 (H) 11/19/2021   CREATININE 2.77 (H) 11/24/2022    Past medical and surgical history were reviewed and updated in EPIC.  Current Meds  Medication Sig   amiodarone (PACERONE) 100 MG tablet TAKE 1 TABLET BY MOUTH EVERY DAY   amLODipine (NORVASC) 10 MG tablet Take 5 mg by mouth daily.   ascorbic acid (VITAMIN C) 500 MG tablet Take 500 mg by mouth daily.   aspirin EC 81 MG tablet Take 81 mg by mouth daily.   atorvastatin (LIPITOR) 40 MG tablet TAKE 1 TABLET BY MOUTH EVERY DAY   cholecalciferol (VITAMIN D3) 25 MCG (1000 UT) tablet Take 1,000 Units by mouth daily.   doxazosin (CARDURA) 4  MG tablet TAKE 1 TABLET BY MOUTH EVERY DAY   enalapril (VASOTEC) 10 MG tablet Take 10 mg by mouth daily.   FARXIGA 10 MG TABS tablet Take 1 tablet (10 mg total) by mouth daily.   ferrous sulfate 325 (65 FE) MG EC tablet Take 325 mg by mouth daily with breakfast.    hydrALAZINE (APRESOLINE) 100 MG tablet TAKE 1 TABLET BY MOUTH THREE TIMES A DAY   isosorbide mononitrate (IMDUR) 60 MG 24 hr tablet TAKE 1 TABLET BY MOUTH EVERY DAY    levothyroxine (SYNTHROID) 25 MCG tablet Take 25 mcg by mouth every morning.   nitroGLYCERIN (NITROSTAT) 0.4 MG SL tablet Place 1 tablet (0.4 mg total) under the tongue every 5 (five) minutes as needed for chest pain. Maximum of 3 doses.   omeprazole (PRILOSEC) 20 MG capsule Take 20 mg by mouth daily.   torsemide (DEMADEX) 10 MG tablet TAKE 1 TABLET BY MOUTH EVERY DAY AS NEEDED   [DISCONTINUED] amLODipine (NORVASC) 10 MG tablet TAKE 1 TABLET BY MOUTH EVERY DAY    Allergies: Patient has no known allergies.  Social History   Tobacco Use   Smoking status: Never   Smokeless tobacco: Never  Vaping Use   Vaping Use: Never used  Substance Use Topics   Alcohol use: No   Drug use: No    Family History  Problem Relation Age of Onset   Hypertension Mother    Stroke Mother    Leukemia Father    Heart attack Paternal Grandmother    Stroke Paternal Grandfather    Heart Problems Son 34   Heart Problems Son 43   Uterine cancer Sister    Prostate cancer Brother    Colon cancer Brother    Kidney failure Brother    Stroke Sister     Review of Systems: A 12-system review of systems was performed and was negative except as noted in the HPI.  --------------------------------------------------------------------------------------------------  Physical Exam: BP 130/70 (BP Location: Left Arm, Patient Position: Sitting, Cuff Size: Normal)   Pulse 66   Ht 5\' 8"  (1.727 m)   Wt 168 lb (76.2 kg)   SpO2 97%   BMI 25.54 kg/m   General:  NAD. Neck: No JVD or HJR. Lungs: Clear to auscultation bilaterally without wheezes or crackles. Heart: Regular rate with 2/6 systolic murmur.  No rubs or gallops. Abdomen: Soft, nontender, nondistended. Extremities: 1+ pretibial edema bilaterally.  EKG: Normal sinus rhythm with first-degree AV block, left anterior fascicular block, borderline LVH, and anterior infarct.  Compared to prior tracing from 11/24/2022, PR interval has decreased and heart rate has  increased.  Lab Results  Component Value Date   WBC 5.5 03/23/2023   HGB 9.9 (L) 03/23/2023   HCT 30.4 (L) 03/23/2023   MCV 99.0 03/23/2023   PLT 166 03/23/2023    Lab Results  Component Value Date   NA 140 11/24/2022   K 4.3 11/24/2022   CL 108 11/24/2022   CO2 22 11/24/2022   BUN 40 (H) 11/24/2022   CREATININE 2.77 (H) 11/24/2022   GLUCOSE 124 (H) 11/24/2022   ALT 16 11/24/2022    Lab Results  Component Value Date   CHOL 127 02/15/2022   HDL 38 (L) 02/15/2022   LDLCALC 63 02/15/2022   TRIG 148 02/15/2022   CHOLHDL 3.3 02/15/2022    --------------------------------------------------------------------------------------------------  ASSESSMENT AND PLAN: Coronary artery disease with stable angina status post CABG: No angina reported on antianginal regimen of amlodipine and isosorbide mononitrate.  Slightly  progressive DOE likely multifactorial.  No plans for ischemia evaluation at this time.  Continue secondary prevention with aspirin and atorvastatin.  Request most recent labs from Ms. Clint Guy to ensure that lipid control is appropriate.  Chronic HFpEF: Mr. Zile has mild leg edema, which waxes and wanes.  He has been using torsemide less regularly on account of his advanced CKD at the urging of his nephrologist.  I agree with as needed torsemide use for worsening edema and weight gain.  Continue current medications including dapagliflozin.  Thoracic aortic aneurysm status post Bentall procedure: Echocardiogram in March showed appropriate valve function.  Continue current medications, including SBE prophylaxis.  Hypertension: Blood pressure borderline elevated today but typically better at home.  This may be driven at least in part by recent de-escalation of amlodipine.  No medication changes today.  Chronic kidney disease stage IV: Creatinine has continued to trend up over the last year or 2.  This is likely multifactorial though I am concerned about Mr. Polio is  regular ibuprofen use.  I have counseled him to avoid using ibuprofen and other NSAIDs.  He can use acetaminophen if needed.  Otherwise, if pain remains uncontrolled he should speak with his PCP and/or orthopedist about other treatment options that would not necessitate regular NSAID use.  Continue close follow-up with nephrology as well.  Celiac artery stenosis: No symptoms reported.  Continue aspirin and statin therapy without plans for intervention.  PSVT: No palpitations reported.  Continue amiodarone 100 mg daily.  Not on beta-blocker in the setting of of first-degree AV block (improved on today's EKG).  Request most recent labs from Ms. Clint Guy to ensure appropriate monitoring of chronic amiodarone use.  Hyperlipidemia: Continue atorvastatin 40 mg daily.  We will request most recent labs drawn by Ms. Clint Guy to ensure that lipids are adequately controlled.  Follow-up: Return to clinic in 6 months.  Yvonne Kendall, MD 05/26/2023 10:55 AM

## 2023-05-27 ENCOUNTER — Telehealth: Payer: Self-pay

## 2023-05-27 NOTE — Telephone Encounter (Signed)
Recent lab work requested from pcp office for Dr. Okey Dupre to review.

## 2023-06-03 ENCOUNTER — Inpatient Hospital Stay: Payer: Medicare HMO | Attending: Oncology

## 2023-06-03 DIAGNOSIS — N183 Chronic kidney disease, stage 3 unspecified: Secondary | ICD-10-CM | POA: Diagnosis not present

## 2023-06-03 DIAGNOSIS — D509 Iron deficiency anemia, unspecified: Secondary | ICD-10-CM | POA: Insufficient documentation

## 2023-06-03 DIAGNOSIS — D631 Anemia in chronic kidney disease: Secondary | ICD-10-CM | POA: Insufficient documentation

## 2023-06-03 LAB — IRON AND TIBC
Iron: 45 ug/dL (ref 45–182)
Saturation Ratios: 17 % — ABNORMAL LOW (ref 17.9–39.5)
TIBC: 272 ug/dL (ref 250–450)
UIBC: 227 ug/dL

## 2023-06-03 LAB — CBC
HCT: 30.9 % — ABNORMAL LOW (ref 39.0–52.0)
Hemoglobin: 10.1 g/dL — ABNORMAL LOW (ref 13.0–17.0)
MCH: 32.5 pg (ref 26.0–34.0)
MCHC: 32.7 g/dL (ref 30.0–36.0)
MCV: 99.4 fL (ref 80.0–100.0)
Platelets: 118 10*3/uL — ABNORMAL LOW (ref 150–400)
RBC: 3.11 MIL/uL — ABNORMAL LOW (ref 4.22–5.81)
RDW: 14 % (ref 11.5–15.5)
WBC: 7 10*3/uL (ref 4.0–10.5)
nRBC: 0 % (ref 0.0–0.2)

## 2023-06-03 LAB — FERRITIN: Ferritin: 121 ng/mL (ref 24–336)

## 2023-06-06 DIAGNOSIS — M1990 Unspecified osteoarthritis, unspecified site: Secondary | ICD-10-CM | POA: Insufficient documentation

## 2023-06-06 DIAGNOSIS — Z8619 Personal history of other infectious and parasitic diseases: Secondary | ICD-10-CM | POA: Insufficient documentation

## 2023-06-06 DIAGNOSIS — K5792 Diverticulitis of intestine, part unspecified, without perforation or abscess without bleeding: Secondary | ICD-10-CM | POA: Insufficient documentation

## 2023-06-11 ENCOUNTER — Other Ambulatory Visit: Payer: Self-pay | Admitting: Internal Medicine

## 2023-07-08 ENCOUNTER — Other Ambulatory Visit: Payer: Self-pay | Admitting: Internal Medicine

## 2023-07-08 DIAGNOSIS — I440 Atrioventricular block, first degree: Secondary | ICD-10-CM

## 2023-07-08 DIAGNOSIS — I471 Supraventricular tachycardia, unspecified: Secondary | ICD-10-CM

## 2023-07-08 DIAGNOSIS — I491 Atrial premature depolarization: Secondary | ICD-10-CM

## 2023-07-21 ENCOUNTER — Telehealth: Payer: Self-pay | Admitting: Internal Medicine

## 2023-07-21 NOTE — Telephone Encounter (Signed)
Pt c/o BP issue: STAT if pt c/o blurred vision, one-sided weakness or slurred speech  1. What are your last 5 BP readings?  8/15: 77/44, 155/95  2. Are you having any other symptoms (ex. Dizziness, headache, blurred vision, passed out)?   3. What is your BP issue?   Patient's daughter in law states patient initially started having issues with his knee that led to fluid retention because he wasn't moving around as easily. This led to patient just generally feeling bad overall. Patient's daughter in law states patient saw kidney specialist this morning and BP was 77/44. He was advised to go straight to the hospital, but he refused. Daughter in law mentions that Friday PCP also advised patient to go to the hospital due to dehydration but he refused because it was his [birth year]. Daughter in law states his BP is now 155/95 and patient does not want to take his BP medication because his BP was low this morning. Please advise.

## 2023-07-21 NOTE — Telephone Encounter (Signed)
Left message to call back  

## 2023-07-22 ENCOUNTER — Telehealth: Payer: Self-pay | Admitting: Internal Medicine

## 2023-07-22 NOTE — Telephone Encounter (Signed)
Duplicate encounter

## 2023-07-22 NOTE — Telephone Encounter (Signed)
Spoke to patient's daughter and she explained that the patient's blood pressure since yesterday had increased to 180/104. Instructed patient's daughter that the recommendations from the previous providers is paramount and that with the patient's comorbidities he needs to be seen for assessment and evaluation immediately. Patient's daughter understood with read back

## 2023-07-22 NOTE — Telephone Encounter (Signed)
Follow Up:    Rodney Wiley is returning a call from yesterday.

## 2023-07-24 ENCOUNTER — Other Ambulatory Visit: Payer: Self-pay | Admitting: Internal Medicine

## 2023-07-26 ENCOUNTER — Observation Stay
Admission: EM | Admit: 2023-07-26 | Discharge: 2023-07-29 | Disposition: A | Discharge status: Home / Self Care | Payer: Medicare HMO | Attending: Internal Medicine | Admitting: Internal Medicine

## 2023-07-26 ENCOUNTER — Emergency Department: Payer: Medicare HMO

## 2023-07-26 ENCOUNTER — Observation Stay (HOSPITAL_BASED_OUTPATIENT_CLINIC_OR_DEPARTMENT_OTHER)
Admit: 2023-07-26 | Discharge: 2023-07-26 | Disposition: A | Discharge status: Home / Self Care | Payer: Medicare HMO | Attending: Medical | Admitting: Medical

## 2023-07-26 ENCOUNTER — Other Ambulatory Visit: Payer: Self-pay

## 2023-07-26 DIAGNOSIS — N184 Chronic kidney disease, stage 4 (severe): Secondary | ICD-10-CM | POA: Diagnosis present

## 2023-07-26 DIAGNOSIS — R55 Syncope and collapse: Secondary | ICD-10-CM

## 2023-07-26 DIAGNOSIS — R0989 Other specified symptoms and signs involving the circulatory and respiratory systems: Secondary | ICD-10-CM

## 2023-07-26 DIAGNOSIS — M25562 Pain in left knee: Secondary | ICD-10-CM | POA: Diagnosis not present

## 2023-07-26 DIAGNOSIS — I5030 Unspecified diastolic (congestive) heart failure: Secondary | ICD-10-CM

## 2023-07-26 DIAGNOSIS — Z7982 Long term (current) use of aspirin: Secondary | ICD-10-CM | POA: Insufficient documentation

## 2023-07-26 DIAGNOSIS — D649 Anemia, unspecified: Secondary | ICD-10-CM | POA: Diagnosis not present

## 2023-07-26 DIAGNOSIS — Z79899 Other long term (current) drug therapy: Secondary | ICD-10-CM | POA: Insufficient documentation

## 2023-07-26 DIAGNOSIS — Z951 Presence of aortocoronary bypass graft: Secondary | ICD-10-CM | POA: Insufficient documentation

## 2023-07-26 DIAGNOSIS — R110 Nausea: Secondary | ICD-10-CM | POA: Diagnosis not present

## 2023-07-26 DIAGNOSIS — I130 Hypertensive heart and chronic kidney disease with heart failure and stage 1 through stage 4 chronic kidney disease, or unspecified chronic kidney disease: Secondary | ICD-10-CM | POA: Diagnosis not present

## 2023-07-26 DIAGNOSIS — I25119 Atherosclerotic heart disease of native coronary artery with unspecified angina pectoris: Secondary | ICD-10-CM | POA: Diagnosis present

## 2023-07-26 DIAGNOSIS — I5032 Chronic diastolic (congestive) heart failure: Secondary | ICD-10-CM | POA: Diagnosis not present

## 2023-07-26 DIAGNOSIS — G8929 Other chronic pain: Secondary | ICD-10-CM | POA: Insufficient documentation

## 2023-07-26 DIAGNOSIS — Z95828 Presence of other vascular implants and grafts: Secondary | ICD-10-CM | POA: Diagnosis not present

## 2023-07-26 DIAGNOSIS — E785 Hyperlipidemia, unspecified: Secondary | ICD-10-CM | POA: Diagnosis present

## 2023-07-26 DIAGNOSIS — R42 Dizziness and giddiness: Principal | ICD-10-CM

## 2023-07-26 DIAGNOSIS — Z952 Presence of prosthetic heart valve: Secondary | ICD-10-CM | POA: Diagnosis not present

## 2023-07-26 DIAGNOSIS — I10 Essential (primary) hypertension: Secondary | ICD-10-CM | POA: Diagnosis not present

## 2023-07-26 DIAGNOSIS — Z8679 Personal history of other diseases of the circulatory system: Secondary | ICD-10-CM

## 2023-07-26 DIAGNOSIS — I25118 Atherosclerotic heart disease of native coronary artery with other forms of angina pectoris: Secondary | ICD-10-CM | POA: Diagnosis present

## 2023-07-26 DIAGNOSIS — I2510 Atherosclerotic heart disease of native coronary artery without angina pectoris: Secondary | ICD-10-CM | POA: Diagnosis present

## 2023-07-26 DIAGNOSIS — I471 Supraventricular tachycardia, unspecified: Secondary | ICD-10-CM | POA: Diagnosis not present

## 2023-07-26 LAB — CBC
HCT: 30.9 % — ABNORMAL LOW (ref 39.0–52.0)
Hemoglobin: 10.4 g/dL — ABNORMAL LOW (ref 13.0–17.0)
MCH: 33.2 pg (ref 26.0–34.0)
MCHC: 33.7 g/dL (ref 30.0–36.0)
MCV: 98.7 fL (ref 80.0–100.0)
Platelets: 165 10*3/uL (ref 150–400)
RBC: 3.13 MIL/uL — ABNORMAL LOW (ref 4.22–5.81)
RDW: 13.7 % (ref 11.5–15.5)
WBC: 7.6 10*3/uL (ref 4.0–10.5)
nRBC: 0 % (ref 0.0–0.2)

## 2023-07-26 LAB — HEPATIC FUNCTION PANEL
ALT: 17 U/L (ref 0–44)
AST: 20 U/L (ref 15–41)
Albumin: 3.6 g/dL (ref 3.5–5.0)
Alkaline Phosphatase: 53 U/L (ref 38–126)
Bilirubin, Direct: 0.1 mg/dL (ref 0.0–0.2)
Indirect Bilirubin: 0.5 mg/dL (ref 0.3–0.9)
Total Bilirubin: 0.6 mg/dL (ref 0.3–1.2)
Total Protein: 6.8 g/dL (ref 6.5–8.1)

## 2023-07-26 LAB — BASIC METABOLIC PANEL
Anion gap: 7 (ref 5–15)
BUN: 37 mg/dL — ABNORMAL HIGH (ref 8–23)
CO2: 17 mmol/L — ABNORMAL LOW (ref 22–32)
Calcium: 8.7 mg/dL — ABNORMAL LOW (ref 8.9–10.3)
Chloride: 109 mmol/L (ref 98–111)
Creatinine, Ser: 2.32 mg/dL — ABNORMAL HIGH (ref 0.61–1.24)
GFR, Estimated: 27 mL/min — ABNORMAL LOW (ref >60)
Glucose, Bld: 133 mg/dL — ABNORMAL HIGH (ref 70–99)
Potassium: 4.2 mmol/L (ref 3.5–5.1)
Sodium: 133 mmol/L — ABNORMAL LOW (ref 135–145)

## 2023-07-26 LAB — TROPONIN I (HIGH SENSITIVITY)
Troponin I (High Sensitivity): 26 ng/L — ABNORMAL HIGH (ref <18)
Troponin I (High Sensitivity): 25 ng/L — ABNORMAL HIGH (ref <18)

## 2023-07-26 LAB — BRAIN NATRIURETIC PEPTIDE: B Natriuretic Peptide: 124.1 pg/mL — ABNORMAL HIGH (ref 0.0–100.0)

## 2023-07-26 LAB — LACTIC ACID, PLASMA: Lactic Acid, Venous: 1.1 mmol/L (ref 0.5–1.9)

## 2023-07-26 MED ORDER — SODIUM CHLORIDE 0.9 % IV SOLN
INTRAVENOUS | Status: AC
  Administered 2023-07-26: 1000 mL via INTRAVENOUS

## 2023-07-26 MED ORDER — TRAMADOL HCL 50 MG PO TABS
50.0000 mg | ORAL_TABLET | Freq: Four times a day (QID) | ORAL | Status: DC | PRN
  Administered 2023-07-27 – 2023-07-29 (×5): 50 mg via ORAL
  Filled 2023-07-26 (×5): qty 1

## 2023-07-26 MED ORDER — AMIODARONE HCL 200 MG PO TABS
100.0000 mg | ORAL_TABLET | Freq: Every day | ORAL | Status: DC
  Administered 2023-07-27 – 2023-07-29 (×3): 100 mg via ORAL
  Filled 2023-07-26 (×3): qty 1

## 2023-07-26 MED ORDER — SODIUM CHLORIDE 0.9 % IV BOLUS: 500.0000 mL | Freq: Once | INTRAVENOUS | Status: DC

## 2023-07-26 MED ORDER — ENOXAPARIN SODIUM 30 MG/0.3ML IJ SOSY
30.0000 mg | PREFILLED_SYRINGE | INTRAMUSCULAR | Status: DC
  Administered 2023-07-26 – 2023-07-28 (×3): 30 mg via SUBCUTANEOUS
  Filled 2023-07-26 (×3): qty 0.3

## 2023-07-26 MED ORDER — ASPIRIN 325 MG PO TABS
325.0000 mg | ORAL_TABLET | Freq: Once | ORAL | Status: AC
  Administered 2023-07-26: 325 mg via ORAL

## 2023-07-26 MED ORDER — DOXAZOSIN MESYLATE 4 MG PO TABS: 4.0000 mg | ORAL_TABLET | Freq: Every day | ORAL | Status: DC

## 2023-07-26 MED ORDER — ASPIRIN 81 MG PO TBEC
81.0000 mg | DELAYED_RELEASE_TABLET | Freq: Every day | ORAL | Status: DC
  Administered 2023-07-27 – 2023-07-29 (×3): 81 mg via ORAL
  Filled 2023-07-26 (×3): qty 1

## 2023-07-26 MED ORDER — GABAPENTIN 100 MG PO CAPS
100.0000 mg | ORAL_CAPSULE | Freq: Every day | ORAL | Status: DC
  Administered 2023-07-26 – 2023-07-28 (×3): 100 mg via ORAL
  Filled 2023-07-26 (×3): qty 1

## 2023-07-26 MED ORDER — HYDRALAZINE HCL 50 MG PO TABS
100.0000 mg | ORAL_TABLET | Freq: Three times a day (TID) | ORAL | Status: DC
  Administered 2023-07-26 – 2023-07-29 (×9): 100 mg via ORAL
  Filled 2023-07-26 (×8): qty 2

## 2023-07-26 MED ORDER — AMLODIPINE BESYLATE 5 MG PO TABS: 5.0000 mg | ORAL_TABLET | Freq: Every day | ORAL | Status: DC

## 2023-07-26 MED ORDER — ISOSORBIDE MONONITRATE ER 60 MG PO TB24
60.0000 mg | ORAL_TABLET | Freq: Every day | ORAL | Status: DC
  Administered 2023-07-27 – 2023-07-29 (×3): 60 mg via ORAL
  Filled 2023-07-26 (×3): qty 1

## 2023-07-26 MED ORDER — SODIUM CHLORIDE 0.9 % IV SOLN: INTRAVENOUS | Status: DC

## 2023-07-26 MED ORDER — PANTOPRAZOLE SODIUM 40 MG IV SOLR
40.0000 mg | Freq: Two times a day (BID) | INTRAVENOUS | Status: DC
  Administered 2023-07-26 – 2023-07-28 (×6): 40 mg via INTRAVENOUS
  Filled 2023-07-26 (×6): qty 10

## 2023-07-26 MED ORDER — ATORVASTATIN CALCIUM 20 MG PO TABS
40.0000 mg | ORAL_TABLET | Freq: Every day | ORAL | Status: DC
  Administered 2023-07-27 – 2023-07-29 (×3): 40 mg via ORAL
  Filled 2023-07-26 (×3): qty 2

## 2023-07-26 NOTE — Assessment & Plan Note (Addendum)
Baseline stage IV CKD with creatinine 2.3-2.9 chronically Creatinine 2.3 today GFR in the 20s Minimize nephrotoxic agents

## 2023-07-26 NOTE — Assessment & Plan Note (Signed)
On amiodarone  NSR on EKG  Follow

## 2023-07-26 NOTE — Assessment & Plan Note (Signed)
Baseline CAD status post CABG March 2019 Atypical presentation with recurrent nausea Troponin in the 20s EKG stable Status post full dose aspirin in the ER Will trend troponin Follow-up cardiology recommendations

## 2023-07-26 NOTE — Assessment & Plan Note (Signed)
2D echo March 2024 with EF of 50 to 55% and grade 1 diastolic dysfunction Appears fairly euvolemic at present Monitor volume status closely

## 2023-07-26 NOTE — Consult Note (Signed)
Cardiology Consultation   Patient ID: Rodney Wiley MRN: [id number]; DOB: March 22, 1936  Admit date: 07/26/2023 Date of Consult: 07/26/2023  PCP:  Armando Gang, FNP   [facility] Providers Cardiologist:  Yvonne Kendall, MD   {   Patient Profile:   Rodney Wiley is a 87 y.o. male with a hx of resistent HTN, CAD s/p CABG in 02/2018, aortic valve regurgitation and thoracic aortic aneurysm s/p Bentall with bioprosthetic aortic valve replacement 02/2018, SVT and frequent PACs, HFpEF, and CKD stage 4 with questionable left renal artery stenosis who is being seen 07/26/2023 for the evaluation of blood pressure at the request of Dr. Alvester Morin.  History of Present Illness:   Rodney Wiley is followed by Dr. Okey Dupre for the above cardiac issues.   The patient underwent CABG and bioprosthetic AVR in 02/2018 with LIMA to LAD, SVG to OM2 and SVG to PDA. 24mm synthetic aortic root graft and 21mm Edwards Inspiris Roselia stented bovine pericardial tissue valve was used. EF shortly after was 50-55%, G2DD. Heart monitor 2020 showed NSR, PACs, frequent PVCs and runs of atrial tachycardia, PVC burden 8.4%. The patient was started on amiodarone. BB was stopped due to 1st degree AV block.   Most recent echo 02/2023 showed normal LVEF 55-60%, mild LVH, G1DD, normal mitral valve, aortic valve mean gradient 18.56mmHg  The patient was last seen 05/2023 and was overall stable.   Seen in the ER at [facility] 07/22/23 for elevated BP, 172/85. Work-up was unremarkable, but he was found to be hypotensive at bedside and Hydralazine was decreased.   The patient presented to the ER 07/26/23 for chest pain and near syncope. He is having episodes of nausea, shakiness, diaphoresis, feeling weak. They are intermittent. No precipitating factors. He feels week and faint feeling, like he's going to black out. Denies fluttering or palpitations. No chest pain. Has had lower back pain and knee issues. He's been taking tylenol and pain  meds for his knee pain.  Knee issues have been goin on for about 6 weeks. Has had ibuprofen as well. He has had fluid removed his knee and has had injections in the knee. He reports decreased appetite for the last 2-3 weeks. He has been having issues with his left shoulder. He fell in July in a parking lot, he fell on his backside. He denies fever or chills. BP has been fluctuating at home.  In the ER BP 157/78, HR 101bpm, RR 16, 98.81F, 97%O2.  Labs show sodium 133, Scr 2.32, BUN 37, CO2 17. CBC 10.4, WBC 7.6. normal LFTs. HS trop 26>25. EKG showed NSR, 1st degree AV block, with LAD and no changes. CXR nonacute. CT head unremarkable. The patient was admitted for further work-up.    Past Medical History:  Diagnosis Date   Anemia    Aortic insufficiency    a. 02/2018 s/p bioprosthetic AVR; 04/2018 Echo: EF 50-55%, Gr2 DD, Ao bioprosthesis, mean grad , Ao root/Asc Ao nl in size, mild MR, mildly dil LA, nl RV fxn. Nl PASP.   Arthropathy    Ascending aortic aneurysm (HCC)    a. 12/2016 MRA: 4.3cm @ sinus of valsalva, 4.9cm above sinotubular jxn; b. 02/2018 s/p biological Bentall and AVR.   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 02/2018 Cath: LAD 20ost, 40p, 26m, LCX 60p, OM2 60, OM3 85, RCA 10p/m w/ L->R and R->R collats;  b. 02/2018 CABG x 3: LIMA to LAD, SVG  to OM2, SVG to PDA, EVH via right thigh.   CKD (chronic kidney disease), stage III (HCC)    Diverticulitis    Essential hypertension    GERD (gastroesophageal reflux disease)    Hemorrhoids    History of chicken pox    History of Helicobacter pylori infection 06/2007   Hyperlipidemia    Hypertension    Left renal artery stenosis (HCC)    a. 01/2017 RA u/s: moderate L RAS.   Lower extremity edema    Nonrheumatic mitral (valve) insufficiency    a. 12/2016 Echo: mild to mod MR; b. 09/2017 TEE: mild to mod MR; c. 04/2018 Echo: Mild MR.   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 s/p 21 mm Edwards Inspiris Resilia stented bovine pericardial tissue valve and 24 mm Gelweave Valsalva synthetic root conduit with reimplantation of left main coronary artery.    Past Surgical History:  Procedure Laterality Date   AORTIC VALVE REPAIR N/A 02/22/2018   Procedure: AORTIC VALVE REPLACEMENT -Biological Bentall aortic root replacement,;  Surgeon: Purcell Nails, MD;  Location[facility] OR;  Service: Open Heart Surgery;  Laterality: N/A;   bypass surgery     CARDIAC CATHETERIZATION     01/16/2018   COLONOSCOPY  2011   by Dr. Bluford Kaufmann with findings of diverticulosis   CORONARY ARTERY BYPASS GRAFT N/A 02/22/2018   Procedure: CORONARY ARTERY BYPASS GRAFTING (CABG) x three, using left internal mammary artery and right leg greater saphenous vein harvested endoscopically;  Surgeon: Purcell Nails, MD;  Location: [facility] OR;  Service: Open Heart Surgery;  Laterality: N/A;   CORONARY/GRAFT ANGIOGRAPHY N/A 01/16/2018   Procedure: CORONARY/GRAFT ANGIOGRAPHY;  Surgeon: Yvonne Kendall, MD;  Location: [facility] INVASIVE CV LAB;  Service: Cardiovascular;  Laterality: N/A;   ELBOW SURGERY Left    hemorhoidectomy     RIGHT HEART CATH N/A 01/16/2018   Procedure: RIGHT HEART CATH;  Surgeon: Yvonne Kendall, MD;  Location: [facility] INVASIVE CV LAB;  Service: Cardiovascular;  Laterality: N/A;   SHOULDER ARTHROSCOPY WITH ROTATOR CUFF REPAIR Right    SHOULDER SURGERY Right    TEE WITHOUT CARDIOVERSION N/A 09/27/2017   Procedure: TRANSESOPHAGEAL ECHOCARDIOGRAM (TEE);  Surgeon: Lars Masson, MD;  Location: [facility] ENDOSCOPY;  Service: Cardiovascular;  Laterality: N/A;   TEE WITHOUT CARDIOVERSION N/A 02/22/2018   Procedure: TRANSESOPHAGEAL ECHOCARDIOGRAM (TEE);  Surgeon: Purcell Nails, MD;  Location: [facility] OR;  Service: Open Heart Surgery;  Laterality: N/A;   THORACIC AORTIC ANEURYSM REPAIR N/A 02/22/2018   Procedure: THORACIC ASCENDING  ANEURYSM REPAIR (AAA) Resection of ascending aorta aneurysm;  Surgeon: Purcell Nails, MD;  Location: [facility] OR;  Service: Open Heart Surgery;  Laterality: N/A;     Home Medications:  Prior to Admission medications   Medication Sig Start Date End Date Taking? Authorizing Provider  amiodarone (PACERONE) 100 MG tablet TAKE 1 TABLET BY MOUTH EVERY DAY 07/11/23  Yes End, Cristal Deer, MD  amLODipine (NORVASC) 10 MG tablet Take 5 mg by mouth daily.   Yes [provider]  aspirin EC 81 MG tablet Take 81 mg by mouth daily.   Yes [provider]  atorvastatin (LIPITOR) 40 MG tablet TAKE 1 TABLET BY MOUTH EVERY DAY 07/11/23  Yes End, Cristal Deer, MD  enalapril (VASOTEC) 10 MG tablet Take 10 mg by mouth daily. 05/18/22  Yes [provider]  gabapentin (NEURONTIN) 100 MG capsule Take 100 mg by mouth  at bedtime. 06/28/23  Yes [provider]  hydrALAZINE (APRESOLINE) 100 MG tablet TAKE 1 TABLET BY MOUTH THREE TIMES A DAY 07/11/23  Yes End, Cristal Deer, MD  isosorbide mononitrate (IMDUR) 60 MG 24 hr tablet TAKE 1 TABLET BY MOUTH EVERY DAY 06/13/23  Yes End, Cristal Deer, MD  nitroGLYCERIN (NITROSTAT) 0.4 MG SL tablet Place 1 tablet (0.4 mg total) under the tongue every 5 (five) minutes as needed for chest pain. Maximum of 3 doses. 11/12/20  Yes End, Cristal Deer, MD  torsemide (DEMADEX) 10 MG tablet TAKE 1 TABLET BY MOUTH EVERY DAY AS NEEDED 07/11/23  Yes End, Cristal Deer, MD  traMADol (ULTRAM) 50 MG tablet Take 50 mg by mouth every 6 (six) hours as needed. 07/15/23  Yes [provider]  ascorbic acid (VITAMIN C) 500 MG tablet Take 500 mg by mouth daily. Patient not taking: Reported on 07/26/2023    [provider]  cholecalciferol (VITAMIN D3) 25 MCG (1000 UT) tablet Take 1,000 Units by mouth daily. Patient not taking: Reported on 07/26/2023    [provider]  doxazosin (CARDURA) 4 MG tablet TAKE 1 TABLET BY MOUTH EVERY DAY Patient not taking: Reported on 07/26/2023  08/07/21   End, Cristal Deer, MD  FARXIGA 10 MG TABS tablet Take 1 tablet (10 mg total) by mouth daily. Patient not taking: Reported on 07/26/2023 05/20/21   End, Cristal Deer, MD  ferrous sulfate 325 (65 FE) MG EC tablet Take 325 mg by mouth daily with breakfast.  Patient not taking: Reported on 07/26/2023    [provider]  levothyroxine (SYNTHROID) 25 MCG tablet Take 25 mcg by mouth every morning. Patient not taking: Reported on 07/26/2023 06/25/21   [provider]  methocarbamol (ROBAXIN) 500 MG tablet Take 500 mg by mouth at bedtime. Patient not taking: Reported on 07/26/2023    [provider]  omeprazole (PRILOSEC) 20 MG capsule Take 20 mg by mouth daily. Patient not taking: Reported on 07/26/2023    [provider]    Inpatient Medications: Scheduled Meds:  amiodarone  100 mg Oral Daily   amLODipine  5 mg Oral Daily   aspirin EC  81 mg Oral Daily   atorvastatin  40 mg Oral Daily   doxazosin  4 mg Oral Daily   enoxaparin (LOVENOX) injection  30 mg Subcutaneous Q24H   Continuous Infusions:  sodium chloride     sodium chloride     PRN Meds:   Allergies:   No Known Allergies  Social History:   Social History   Socioeconomic History   Marital status: Widowed    Spouse name: Not on file   Number of children: Not on file   Years of education: Not on file   Highest education level: Not on file  Occupational History   Not on file  Tobacco Use   Smoking status: Never   Smokeless tobacco: Never  Vaping Use   Vaping status: Never Used  Substance and Sexual Activity   Alcohol use: No   Drug use: No   Sexual activity: Not Currently  Other Topics Concern   Not on file  Social History Narrative   Not on file   Social Determinants of Health   Financial Resource Strain: Not on file  Food Insecurity: Not on file  Transportation Needs: Not on file  Physical Activity: Not on file  Stress: Not on file  Social Connections: Not on file   Intimate Partner Violence: Not on file    Family History:  Family History  Problem Relation Age of Onset   Hypertension Mother    Stroke Mother    Leukemia Father    Heart attack Paternal Grandmother    Stroke Paternal Grandfather    Heart Problems Son 18   Heart Problems Son 23   Uterine cancer Sister    Prostate cancer Brother    Colon cancer Brother    Kidney failure Brother    Stroke Sister      ROS:  Please see the history of present illness.   All other ROS reviewed and negative.     Physical Exam/Data:   Vitals:   07/26/23 0920 07/26/23 1040 07/26/23 1327 07/26/23 1328  BP: (!) 157/78 (!) 149/65 (!) 161/68   Pulse: (!) 101 69 69   Resp: 16 16 16    Temp: 98.1 F (36.7 C)   98 F (36.7 C)  TempSrc: Oral   Oral  SpO2: 97% 98% 98%   Weight: 72.6 kg     Height: 5\' 7"  (1.702 m)      No intake or output data in the 24 hours ending 07/26/23 1412    07/26/2023    9:20 AM 05/26/2023    9:20 AM 01/21/2023   12:47 PM  Last 3 Weights  Weight (lbs) 160 lb 168 lb 172 lb  Weight (kg) 72.576 kg 76.204 kg 78.019 kg     Body mass index is 25.06 kg/m.  General:  Well nourished, well developed, in no acute distress HEENT: normal Neck: no JVD Vascular: No carotid bruits; Distal pulses 2+ bilaterally Cardiac:  normal S1, S2; RRR; no murmur  Lungs:  clear to auscultation bilaterally, no wheezing, rhonchi or rales  Abd: soft, nontender, no hepatomegaly  Ext: trace lower leg edema Musculoskeletal:  No deformities, BUE and BLE strength normal and equal Skin: warm and dry  Neuro:  CNs 2-12 intact, no focal abnormalities noted Psych:  Normal affect   EKG:  The EKG was personally reviewed and demonstrates:  NSR 1st degree AV block, no changes Telemetry:  Telemetry was personally reviewed and demonstrates:  N/A  Relevant CV Studies:  Echo 02/11/23 1. Left ventricular ejection fraction, by estimation, is 55 to 60%. The  left ventricle has normal function. The left  ventricle demonstrates  regional wall motion abnormalities (septal wall hypokinesis possibly  secondary to post-operative state). There is   mild left ventricular hypertrophy. Left ventricular diastolic parameters  are consistent with Grade I diastolic dysfunction (impaired relaxation).  The average left ventricular global longitudinal strain is -12.6 %.   2. Right ventricular systolic function is normal. The right ventricular  size is normal. Tricuspid regurgitation signal is inadequate for assessing  PA pressure.   3. Left atrial size was moderately dilated.   4. The mitral valve is normal in structure. No evidence of mitral valve  regurgitation. No evidence of mitral stenosis.   5. The aortic valve has been repaired/replaced. Aortic valve  regurgitation is not visualized. No aortic stenosis is present. There is a  Edwards bioprosthetic valve present in the aortic position. Procedure  Date: 02/2018. Aortic valve mean gradient  measures 18.7 mmHg. Aortic valve Vmax measures 2.95 m/s.   6. The inferior vena cava is normal in size with greater than 50%  respiratory variability, suggesting right atrial pressure of 3 mmHg.   Echo 03/17/21  1. Left ventricular ejection fraction, by estimation, is 60 to 65%. Left  ventricular ejection fraction by 3D volume is 69 %. The left ventricle has  normal function. The left ventricle has no regional wall motion  abnormalities. There is mild left  ventricular hypertrophy. Left ventricular diastolic parameters are  indeterminate.   2. Right ventricular systolic function is low normal. The right  ventricular size is normal. There is normal pulmonary artery systolic  pressure.   3. The mitral valve is normal in structure. Mild to moderate mitral valve  regurgitation.   4. The aortic valve has been repaired/replaced. Aortic valve  regurgitation is not visualized. There is a Edwards bioprosthetic valve  present in the aortic position. Echo findings are  consistent with normal  structure and function of the aortic valve  prosthesis. Aortic valve mean gradient measures 20.5 mmHg.   5. The inferior vena cava is normal in size with greater than 50%  respiratory variability, suggesting right atrial pressure of 3 mmHg.    Laboratory Data:  High Sensitivity Troponin:   Recent Labs  Lab 07/26/23 0926 07/26/23 1115  TROPONINIHS 26* 25*     Chemistry Recent Labs  Lab 07/26/23 0926  NA 133*  K 4.2  CL 109  CO2 17*  GLUCOSE 133*  BUN 37*  CREATININE 2.32*  CALCIUM 8.7*  GFRNONAA 27*  ANIONGAP 7    Recent Labs  Lab 07/26/23 0926  PROT 6.8  ALBUMIN 3.6  AST 20  ALT 17  ALKPHOS 53  BILITOT 0.6   Lipids No results for input(s): "CHOL", "TRIG", "HDL", "LABVLDL", "LDLCALC", "CHOLHDL" in the last 168 hours.  Hematology Recent Labs  Lab 07/26/23 0926  WBC 7.6  RBC 3.13*  HGB 10.4*  HCT 30.9*  MCV 98.7  MCH 33.2  MCHC 33.7  RDW 13.7  PLT 165   Thyroid No results for input(s): "TSH", "FREET4" in the last 168 hours.  BNPNo results for input(s): "BNP", "PROBNP" in the last 168 hours.  DDimer No results for input(s): "DDIMER" in the last 168 hours.   Radiology/Studies:  DG Chest Port 1 View  Result Date: 07/26/2023 CLINICAL DATA:  87 year old male with hypertension, weakness/fatigue, diaphoretic, presyncope. EXAM: PORTABLE CHEST 1 VIEW COMPARISON:  Chest radiographs 11/19/2021 and earlier. FINDINGS: Seated AP view at 1013 hours. Lordotic positioning today. Chronic CABG and cardiac valve replacement, sternotomy. Stable cardiac size and mediastinal contours. Visualized tracheal air column is within normal limits. Allowing for portable technique the lungs are clear. No pneumothorax. No acute osseous abnormality identified. Negative visible bowel gas. IMPRESSION: No acute cardiopulmonary abnormality. Electronically Signed   By: Odessa Fleming M.D.   On: 07/26/2023 11:33   CT Head Wo Contrast  Result Date: 07/26/2023 CLINICAL DATA:   87 year old male with hypertension, weakness/fatigue, diaphoretic, presyncope. EXAM: CT HEAD WITHOUT CONTRAST TECHNIQUE: Contiguous axial images were obtained from the base of the skull through the vertex without intravenous contrast. RADIATION DOSE REDUCTION: This exam was performed according to the departmental dose-optimization program which includes automated exposure control, adjustment of the mA and/or kV according to patient size and/or use of iterative reconstruction technique. COMPARISON:  None Available. FINDINGS: Brain: No midline shift, ventriculomegaly, mass effect, evidence of mass lesion, intracranial hemorrhage or evidence of cortically based acute infarction. Small area of cortical and subcortical white matter encephalomalacia in the posterior right MCA territory on series 2, image 23 and coronal image 51, right parietal lobe. But elsewhere largely normal for age gray-white differentiation. Vascular: Calcified atherosclerosis at the skull base. No suspicious intracranial vascular hyperdensity. Skull: No acute osseous abnormality identified. Sinuses/Orbits: Visualized paranasal sinuses and mastoids are well aerated. Other: No acute orbit  or scalp soft tissue finding. IMPRESSION: 1. No acute intracranial abnormality. 2. 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 Chronic posterior right MCA territory infarct. Electronically Signed   By: Odessa Fleming M.D.   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) - continue to monitor telemetry  Elevated troponin CAD s/p CABG in 2019 - no chest pain reported - HS trop minimally elevated with flat trend, can continue to trend - EKG is nonischemic - no indication for IV heparin  - check echo as above - PTA Imdur 60mg  daily, Lipitor 40mg  daily, ASA 81mg  daily  HTN - patient reports high Bps at home - PTA enalapril 10mg  daily, Farxiga 10mg  daily, amlodipine 10mg  daily, Hydralazine 100mg  TID, Imdur 60mg  daily - farxiga and ACE held - elevated Bps here, continue to monitor  AVR - AVR in 2019 with CABG - appears euvolemic - echo in 02/2023 with stable valve, mean gradient 18.41mmHg - re-check echo  CKD stage 4 - SCr around baseline  Thoracic aortic aneurysm s/p Bentall procedure - echo as above  pSVT/PAC/PVC - continue amiodarone 100mg  daily - monitor tele  For questions or updates, please contact [facility] Please consult [email or web address] for contact info under    Signed, Caron Tardif David Stall, PA-C  07/26/2023 2:12 PM

## 2023-07-26 NOTE — ED Provider Notes (Signed)
[facility] Provider Note    Event Date/Time   First MD Initiated Contact with Patient 07/26/23 [phone]     (approximate)   History   Hypertension   HPI  Rodney Wiley is a 87 y.o. male with history of hypertension, CAD s/p CABG, CHF, CKD presenting to the emergency department for elevated blood pressure and lightheadedness.  Patient reports he was generally weak over the past several weeks.  He has also had labile blood pressures during this time with multiple systolics greater than 200s.  Last night and this morning he had an episode of onset of nausea with associated sweatiness and generalized weakness. He became lightheaded and thought he might pass out, but did not fully pass out.  Episode lasted for about 20 to 30 minutes.  Noted to have some tremors during this time.  Denies chest pain or shortness of breath. No fevers or chills.   Physical Exam   Triage Vital Signs: ED Triage Vitals  Encounter Vitals Group     BP 07/26/23 0920 (!) 157/78     Systolic BP Percentile --      Diastolic BP Percentile --      Pulse Rate 07/26/23 0920 (!) 101     Resp 07/26/23 0920 16     Temp 07/26/23 0920 98.1 F (36.7 C)     Temp Source 07/26/23 0920 Oral     SpO2 07/26/23 0920 97 %     Weight 07/26/23 0920 160 lb (72.6 kg)     Height 07/26/23 0920 5\' 7"  (1.702 m)     Head Circumference --      Peak Flow --      Pain Score 07/26/23 0924 0     Pain Loc --      Pain Education --      Exclude from Growth Chart --     Most recent vital signs: Vitals:   07/26/23 1327 07/26/23 1328  BP: (!) 161/68   Pulse: 69   Resp: 16   Temp:  98 F (36.7 C)  SpO2: 98%      General: Awake, interactive  CV:  Regular rate, good peripheral perfusion.  Resp:  Lungs clear, unlabored respirations.  Abd:  Soft, nondistended.  Neuro:  Alert and oriented, normal extraocular movements, symmetric facial movement, sensation intact over bilateral upper and lower extremities with 5  out of 5 strength.  Normal finger-to-nose testing.   ED Results / Procedures / Treatments   Labs (all labs ordered are listed, but only abnormal results are displayed) Labs Reviewed  BASIC METABOLIC PANEL - Abnormal; Notable for the following components:      Result Value   Sodium 133 (*)    CO2 17 (*)    Glucose, Bld 133 (*)    BUN 37 (*)    Creatinine, Ser 2.32 (*)    Calcium 8.7 (*)    GFR, Estimated 27 (*)    All other components within normal limits  CBC - Abnormal; Notable for the following components:   RBC 3.13 (*)    Hemoglobin 10.4 (*)    HCT 30.9 (*)    All other components within normal limits  BRAIN NATRIURETIC PEPTIDE - Abnormal; Notable for the following components:   B Natriuretic Peptide 124.1 (*)    All other components within normal limits  TROPONIN I (HIGH SENSITIVITY) - Abnormal; Notable for the following components:   Troponin I (High Sensitivity) 26 (*)    All other  components within normal limits  TROPONIN I (HIGH SENSITIVITY) - Abnormal; Notable for the following components:   Troponin I (High Sensitivity) 25 (*)    All other components within normal limits  HEPATIC FUNCTION PANEL  LACTIC ACID, PLASMA  TROPONIN I (HIGH SENSITIVITY)     EKG EKG independently reviewed interpreted by myself (ER attending) demonstrates:  EKG demonstrates sinus rhythm at a rate of 74, PR 324, QRS 86, QTc 397 subtle depressions noted in multiple leads more prominent than prior without significant ST elevation  RADIOLOGY Imaging independently reviewed and interpreted by myself demonstrates:  CXR without focal consolidation CT head without acute bleed  PROCEDURES:  Critical Care performed: No  Procedures   MEDICATIONS ORDERED IN ED: Medications  enoxaparin (LOVENOX) injection 30 mg (has no administration in time range)  amLODipine (NORVASC) tablet 5 mg (has no administration in time range)  amiodarone (PACERONE) tablet 100 mg (has no administration in time  range)  aspirin EC tablet 81 mg (has no administration in time range)  atorvastatin (LIPITOR) tablet 40 mg (has no administration in time range)  0.9 %  sodium chloride infusion (1,000 mLs Intravenous New Bag/Given 07/26/23 1445)  gabapentin (NEURONTIN) capsule 100 mg (has no administration in time range)  hydrALAZINE (APRESOLINE) tablet 100 mg (has no administration in time range)  isosorbide mononitrate (IMDUR) 24 hr tablet 60 mg (has no administration in time range)  traMADol (ULTRAM) tablet 50 mg (has no administration in time range)  pantoprazole (PROTONIX) injection 40 mg (has no administration in time range)  aspirin tablet 325 mg (325 mg Oral Given 07/26/23 1243)     IMPRESSION / MDM / ASSESSMENT AND PLAN / ED COURSE  I reviewed the triage vital signs and the nursing notes.  Differential diagnosis includes, but is not limited to, arrhythmia, anemia, electrolyte abnormality, ACS, pneumonia, pneumothorax, hypertensive emergency including pulmonary edema, cardiac strain, intracranial bleed  Patient's presentation is most consistent with acute presentation with potential threat to life or bodily function.  87 year old male presenting with weakness and hypertension after an episode of near syncope with associated nausea and diaphoresis without chest pain. Blood pressure improved here.  Stable to improved renal impairment and anemia.  Troponin is elevated at 26, no recent prior for comparison, repeat stable at 25.  Patient without chest pain, but EKG does have some subtle ST changes and episode of near syncope at rest is concerning.Discussed results of workup with patient.  Will reach out to hospitalist team to discuss admission.    FINAL CLINICAL IMPRESSION(S) / ED DIAGNOSES   Final diagnoses:  Lightheadedness  Nausea     Rx / DC Orders   ED Discharge Orders     None        Note:  This document was prepared using Dragon voice recognition software and may include  unintentional dictation errors.   Trinna Post, MD 07/26/23 [phone]

## 2023-07-26 NOTE — Assessment & Plan Note (Addendum)
Status post repair (bentall)March 2019 Managed by Dr. Okey Dupre outpatient

## 2023-07-26 NOTE — Assessment & Plan Note (Addendum)
+   generalized nausea, diaphoresis dizziness w/o true syncope  Some concern for cardiac vs. GI etiology  Noted extensive cardiac history- pending cardiology evaluation  Non focal neuro exam  CT head WNL  Will check orthostatic BP if possible  Follow up cardiology recommendations  Consider neurologic etiology if symptoms persist despite treatment and evaluation.

## 2023-07-26 NOTE — Assessment & Plan Note (Signed)
BP stable Titrate home regimen 

## 2023-07-26 NOTE — ED Notes (Signed)
See triage note  Family states that he has had some issues with his b/p over the past couple of weeks  And that she noticed a tremor to left hand about 2 weeks ago  Denies any pain  Provider in room on arrival

## 2023-07-26 NOTE — H&P (Addendum)
History and Physical    Patient: Rodney Wiley JYN:[id number] DOB: 1936-01-02 DOA: 07/26/2023 DOS: the patient was seen and examined on 07/26/2023 PCP: Armando Gang, FNP  Patient coming from: Home  Chief Complaint:  Chief Complaint  Patient presents with   Hypertension   HPI: Rodney Wiley is a 87 y.o. 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  Daughter reports patient with recurrent nausea over the past week or so.  Has had recurrent episodes of nausea, diaphoresis and malaise.  No true chest pain.  No diarrhea.  No reported emesis.  Had been evaluated by nephrology within the past week with noted low blood pressure.  No medication adjustment at that time.  Per the daughter, blood pressure has remained fairly elevated at home.  No focal hemiparesis or confusion.  No reported falls.  Has had decreased p.o. intake secondary to episodes of recurrent nausea.  No frank abdominal pain.  Follows Dr. Okey Dupre outpatient from a cardiology standpoint. Presented to the ER afebrile, hemodynamically stable.  Satting well on room air.  White count 7.6, hemoglobin 10.4, platelets 165, troponin 26-25.  Creatinine 2.32, bicarb 17.  CT head chest x-ray grossly stable.  EKG is normal sinus rhythm with first-degree AV block Review of Systems: As mentioned in the history of present illness. All other systems reviewed and are negative. Past Medical History:  Diagnosis Date   Anemia    Aortic insufficiency    a. 02/2018 s/p bioprosthetic AVR; 04/2018 Echo: EF 50-55%, Gr2 DD, Ao bioprosthesis, mean grad , Ao root/Asc Ao nl in size, mild MR, mildly dil LA, nl RV fxn. Nl PASP.   Arthropathy     Ascending aortic aneurysm (HCC)    a. 12/2016 MRA: 4.3cm @ sinus of valsalva, 4.9cm above sinotubular jxn; b. 02/2018 s/p biological Bentall and AVR.   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 02/2018 Cath: LAD 20ost, 40p, 96m, LCX 60p, OM2 60, OM3 85, RCA 10p/m w/ L->R and R->R collats;  b. 02/2018 CABG x 3: LIMA to LAD, SVG to OM2, SVG to PDA, EVH via right thigh.   CKD (chronic kidney disease), stage III (HCC)    Diverticulitis    Essential hypertension    GERD (gastroesophageal reflux disease)    Hemorrhoids    History of chicken pox    History of Helicobacter pylori infection 06/2007   Hyperlipidemia    Hypertension    Left renal artery stenosis (HCC)    a. 01/2017 RA u/s: moderate L RAS.   Lower extremity edema    Nonrheumatic mitral (valve) insufficiency    a. 12/2016 Echo: mild to mod MR; b. 09/2017 TEE: mild to mod MR; c. 04/2018 Echo: Mild MR.   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 s/p 21 mm Edwards Inspiris Resilia stented bovine pericardial tissue valve and 24 mm Gelweave Valsalva synthetic root conduit with reimplantation of left main coronary artery.   Past Surgical History:  Procedure Laterality Date   AORTIC VALVE REPAIR N/A 02/22/2018   Procedure: AORTIC VALVE REPLACEMENT -Biological Bentall aortic root replacement,;  Surgeon: Cornelius Moras,  Salvatore Decent, MD;  Location: [facility] OR;  Service: Open Heart Surgery;  Laterality: N/A;   bypass surgery     CARDIAC CATHETERIZATION     01/16/2018   COLONOSCOPY  2011   by Dr. Bluford Kaufmann with findings of diverticulosis   CORONARY ARTERY BYPASS GRAFT N/A 02/22/2018   Procedure: CORONARY ARTERY BYPASS GRAFTING (CABG) x three, using left internal mammary artery and right leg greater saphenous vein harvested endoscopically;  Surgeon: Purcell Nails, MD;  Location: [facility] OR;  Service: Open Heart Surgery;   Laterality: N/A;   CORONARY/GRAFT ANGIOGRAPHY N/A 01/16/2018   Procedure: CORONARY/GRAFT ANGIOGRAPHY;  Surgeon: Yvonne Kendall, MD;  Location: [facility] INVASIVE CV LAB;  Service: Cardiovascular;  Laterality: N/A;   ELBOW SURGERY Left    hemorhoidectomy     RIGHT HEART CATH N/A 01/16/2018   Procedure: RIGHT HEART CATH;  Surgeon: Yvonne Kendall, MD;  Location: [facility] INVASIVE CV LAB;  Service: Cardiovascular;  Laterality: N/A;   SHOULDER ARTHROSCOPY WITH ROTATOR CUFF REPAIR Right    SHOULDER SURGERY Right    TEE WITHOUT CARDIOVERSION N/A 09/27/2017   Procedure: TRANSESOPHAGEAL ECHOCARDIOGRAM (TEE);  Surgeon: Lars Masson, MD;  Location: [facility] ENDOSCOPY;  Service: Cardiovascular;  Laterality: N/A;   TEE WITHOUT CARDIOVERSION N/A 02/22/2018   Procedure: TRANSESOPHAGEAL ECHOCARDIOGRAM (TEE);  Surgeon: Purcell Nails, MD;  Location: [facility] OR;  Service: Open Heart Surgery;  Laterality: N/A;   THORACIC AORTIC ANEURYSM REPAIR N/A 02/22/2018   Procedure: THORACIC ASCENDING ANEURYSM REPAIR (AAA) Resection of ascending aorta aneurysm;  Surgeon: Purcell Nails, MD;  Location: [facility] OR;  Service: Open Heart Surgery;  Laterality: N/A;   Social History:  reports that he has never smoked. He has never used smokeless tobacco. He reports that he does not drink alcohol and does not use drugs.  No Known Allergies  Family History  Problem Relation Age of Onset   Hypertension Mother    Stroke Mother    Leukemia Father    Heart attack Paternal Grandmother    Stroke Paternal Grandfather    Heart Problems Son 62   Heart Problems Son 86   Uterine cancer Sister    Prostate cancer Brother    Colon cancer Brother    Kidney failure Brother    Stroke Sister     Prior to Admission medications   Medication Sig Start Date End Date Taking? Authorizing Provider  amiodarone (PACERONE) 100 MG tablet TAKE 1 TABLET BY MOUTH EVERY DAY 07/11/23  Yes End, Cristal Deer, MD  amLODipine (NORVASC) 10 MG tablet Take 5 mg by mouth daily.    Yes [provider]  aspirin EC 81 MG tablet Take 81 mg by mouth daily.   Yes [provider]  atorvastatin (LIPITOR) 40 MG tablet TAKE 1 TABLET BY MOUTH EVERY DAY 07/11/23  Yes End, Cristal Deer, MD  enalapril (VASOTEC) 10 MG tablet Take 10 mg by mouth daily. 05/18/22  Yes [provider]  gabapentin (NEURONTIN) 100 MG capsule Take 100 mg by mouth at bedtime. 06/28/23  Yes [provider]  hydrALAZINE (APRESOLINE) 100 MG tablet TAKE 1 TABLET BY MOUTH THREE TIMES A DAY 07/11/23  Yes End, Cristal Deer, MD  isosorbide mononitrate (IMDUR) 60 MG 24 hr tablet TAKE 1 TABLET BY MOUTH EVERY DAY 06/13/23  Yes End, Cristal Deer, MD  nitroGLYCERIN (NITROSTAT) 0.4 MG SL tablet Place 1 tablet (0.4 mg total) under the tongue every 5 (five) minutes as needed for chest pain. Maximum of 3 doses. 11/12/20  Yes End, Cristal Deer, MD  torsemide (  DEMADEX) 10 MG tablet TAKE 1 TABLET BY MOUTH EVERY DAY AS NEEDED 07/11/23  Yes End, Cristal Deer, MD  traMADol (ULTRAM) 50 MG tablet Take 50 mg by mouth every 6 (six) hours as needed. 07/15/23  Yes [provider]  ascorbic acid (VITAMIN C) 500 MG tablet Take 500 mg by mouth daily. Patient not taking: Reported on 07/26/2023    [provider]  cholecalciferol (VITAMIN D3) 25 MCG (1000 UT) tablet Take 1,000 Units by mouth daily. Patient not taking: Reported on 07/26/2023    [provider]  doxazosin (CARDURA) 4 MG tablet TAKE 1 TABLET BY MOUTH EVERY DAY Patient not taking: Reported on 07/26/2023 08/07/21   End, Cristal Deer, MD  FARXIGA 10 MG TABS tablet Take 1 tablet (10 mg total) by mouth daily. Patient not taking: Reported on 07/26/2023 05/20/21   End, Cristal Deer, MD  ferrous sulfate 325 (65 FE) MG EC tablet Take 325 mg by mouth daily with breakfast.  Patient not taking: Reported on 07/26/2023    [provider]  levothyroxine (SYNTHROID) 25 MCG tablet Take 25 mcg by mouth every morning. Patient not taking: Reported on  07/26/2023 06/25/21   [provider]  methocarbamol (ROBAXIN) 500 MG tablet Take 500 mg by mouth at bedtime. Patient not taking: Reported on 07/26/2023    [provider]  omeprazole (PRILOSEC) 20 MG capsule Take 20 mg by mouth daily. Patient not taking: Reported on 07/26/2023    [provider]    Physical Exam: Vitals:   07/26/23 0920 07/26/23 1040 07/26/23 1327 07/26/23 1328  BP: (!) 157/78 (!) 149/65 (!) 161/68   Pulse: (!) 101 69 69   Resp: 16 16 16    Temp: 98.1 F (36.7 C)   98 F (36.7 C)  TempSrc: Oral   Oral  SpO2: 97% 98% 98%   Weight: 72.6 kg     Height: 5\' 7"  (1.702 m)      Physical Exam Constitutional:      Appearance: He is normal weight.  HENT:     Head: Normocephalic and atraumatic.     Nose: Nose normal.     Mouth/Throat:     Mouth: Mucous membranes are moist.  Cardiovascular:     Rate and Rhythm: Normal rate and regular rhythm.  Pulmonary:     Effort: Pulmonary effort is normal.  Abdominal:     General: Abdomen is flat. Bowel sounds are normal.  Musculoskeletal:        General: Normal range of motion.     Cervical back: Normal range of motion.  Skin:    General: Skin is warm.  Neurological:     General: No focal deficit present.  Psychiatric:        Mood and Affect: Mood normal.     Data Reviewed:  There are no new results to review at this time. DG Chest Port 1 View CLINICAL DATA:  88 year old male with hypertension, weakness/fatigue, diaphoretic, presyncope.  EXAM: PORTABLE CHEST 1 VIEW  COMPARISON:  Chest radiographs 11/19/2021 and earlier.  FINDINGS: Seated AP view at 1013 hours. Lordotic positioning today. Chronic CABG and cardiac valve replacement, sternotomy. Stable cardiac size and mediastinal contours. Visualized tracheal air column is within normal limits. Allowing for portable technique the lungs are clear. No pneumothorax. No acute osseous abnormality identified. Negative visible bowel  gas.  IMPRESSION: No acute cardiopulmonary abnormality.  Electronically Signed   By: Odessa Fleming M.D.   On: 07/26/2023 11:33 CT Head Wo Contrast CLINICAL DATA:  87 year old  male with hypertension, weakness/fatigue, diaphoretic, presyncope.  EXAM: CT HEAD WITHOUT CONTRAST  TECHNIQUE: Contiguous axial images were obtained from the base of the skull through the vertex without intravenous contrast.  RADIATION DOSE REDUCTION: This exam was performed according to the departmental dose-optimization program which includes automated exposure control, adjustment of the mA and/or kV according to patient size and/or use of iterative reconstruction technique.  COMPARISON:  None Available.  FINDINGS: Brain: No midline shift, ventriculomegaly, mass effect, evidence of mass lesion, intracranial hemorrhage or evidence of cortically based acute infarction. Small area of cortical and subcortical white matter encephalomalacia in the posterior right MCA territory on series 2, image 23 and coronal image 51, right parietal lobe. But elsewhere largely normal for age gray-white differentiation.  Vascular: Calcified atherosclerosis at the skull base. No suspicious intracranial vascular hyperdensity.  Skull: No acute osseous abnormality identified.  Sinuses/Orbits: Visualized paranasal sinuses and mastoids are well aerated.  Other: No acute orbit or scalp soft tissue finding.  IMPRESSION: 1. No acute intracranial abnormality. 2. 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 Chronic posterior right MCA territory infarct.  Electronically Signed   By: Odessa Fleming M.D.   On: 07/26/2023 11:32  Lab Results  Component Value Date   WBC 7.6 07/26/2023   HGB 10.4 (L) 07/26/2023   HCT 30.9 (L) 07/26/2023   MCV 98.7 07/26/2023   PLT 165 07/26/2023   Last metabolic panel Lab Results  Component Value Date   GLUCOSE 133 (H) 07/26/2023   NA 133 (L) 07/26/2023   K 4.2 07/26/2023   CL 109  07/26/2023   CO2 17 (L) 07/26/2023   BUN 37 (H) 07/26/2023   CREATININE 2.32 (H) 07/26/2023   GFRNONAA 27 (L) 07/26/2023   CALCIUM 8.7 (L) 07/26/2023   PHOS 3.3 03/02/2018   PROT 6.8 07/26/2023   ALBUMIN 3.6 07/26/2023   LABGLOB 2.4 11/19/2021   AGRATIO 1.5 11/19/2021   BILITOT 0.6 07/26/2023   ALKPHOS 53 07/26/2023   AST 20 07/26/2023   ALT 17 07/26/2023   ANIONGAP 7 07/26/2023    Assessment and Plan: Nausea Patient with recurring episodes of nausea, diaphoresis, weakness over the past week No reported vomiting or overt chest pain/chest tightness Differential diagnosis fairly broad including GI and cardiac etiologies Noted baseline extensive coronary disease including CAD post CABG (3/19) aortic regurgitation and thoracic aortic aneurysm status post bioprosthetic aortic valve replacement (3/19) Also with history of superior mesenteric artery stenosis Troponin in the 20s EKG grossly stable IV PPI for gastritis coverage Pending formal cardiology evaluation given prior cardiac history Consider angiography of the abdomen pelvis if symptoms fail to improve-deferring for now in the setting of stage IV CKD Lactate pending Follow-up cardiology recommendations Monitor closely  Coronary artery disease involving native coronary artery of native heart with angina pectoris (HCC) Baseline CAD status post CABG March 2019 Atypical presentation with recurrent nausea Troponin in the 20s EKG stable Status post full dose aspirin in the ER Will trend troponin Follow-up cardiology recommendations  Pre-syncope + generalized nausea, diaphoresis dizziness w/o true syncope  Some concern for cardiac vs. GI etiology  Noted extensive cardiac history- pending cardiology evaluation  Non focal neuro exam  CT head WNL  Will check orthostatic BP if possible  Follow up cardiology recommendations  Consider neurologic etiology if symptoms persist despite treatment and evaluation.      Chronic  kidney disease, stage IV (severe) (HCC) Baseline stage IV CKD with creatinine 2.3-2.9 chronically Creatinine 2.3 today GFR in the  20s Minimize nephrotoxic agents   PSVT (paroxysmal supraventricular tachycardia) On amiodarone  NSR on EKG  Follow   Chronic diastolic heart failure (HCC) 2D echo March 2024 with EF of 50 to 55% and grade 1 diastolic dysfunction Appears fairly euvolemic at present Monitor volume status closely  S/P ascending aortic aneurysm repair Status post repair (bentall)March 2019 Managed by Dr. Okey Dupre outpatient   Hyperlipidemia LDL goal <70 Continue statin  Essential hypertension BP stable Titrate home regimen      Advance Care Planning:   Code Status: Full Code   Consults: Cardiology- End   Family Communication: Daughter at the bedside   Severity of Illness: The appropriate patient status for this patient is OBSERVATION. Observation status is judged to be reasonable and necessary in order to provide the required intensity of service to ensure the patient's safety. The patient's presenting symptoms, physical exam findings, and initial radiographic and laboratory data in the context of their medical condition is felt to place them at decreased risk for further clinical deterioration. Furthermore, it is anticipated that the patient will be medically stable for discharge from the hospital within 2 midnights of admission.   Author: Floydene Flock, MD 07/26/2023 2:26 PM  For on call review [email or web address].

## 2023-07-26 NOTE — ED Triage Notes (Signed)
Pt to ED for HTN for several days. Pt takes meds for HTN. Reports feeling generalized weakness/fatigue. Also reports getting sweats and clammy.

## 2023-07-26 NOTE — Assessment & Plan Note (Signed)
Continue statin. 

## 2023-07-26 NOTE — Assessment & Plan Note (Addendum)
Patient with recurring episodes of nausea, diaphoresis, weakness over the past week No reported vomiting or overt chest pain/chest tightness Differential diagnosis fairly broad including GI and cardiac etiologies Noted baseline extensive coronary disease including CAD post CABG (3/19) aortic regurgitation and thoracic aortic aneurysm status post bioprosthetic aortic valve replacement (3/19) Also with history of superior mesenteric artery stenosis Troponin in the 20s EKG grossly stable IV PPI for gastritis coverage Pending formal cardiology evaluation given prior cardiac history Consider angiography of the abdomen pelvis if symptoms fail to improve-deferring for now in the setting of stage IV CKD Lactate pending Follow-up cardiology recommendations Monitor closely

## 2023-07-27 ENCOUNTER — Observation Stay: Payer: Medicare HMO

## 2023-07-27 DIAGNOSIS — I471 Supraventricular tachycardia, unspecified: Secondary | ICD-10-CM

## 2023-07-27 DIAGNOSIS — E785 Hyperlipidemia, unspecified: Secondary | ICD-10-CM

## 2023-07-27 DIAGNOSIS — R55 Syncope and collapse: Secondary | ICD-10-CM

## 2023-07-27 DIAGNOSIS — R110 Nausea: Secondary | ICD-10-CM | POA: Diagnosis not present

## 2023-07-27 DIAGNOSIS — I25119 Atherosclerotic heart disease of native coronary artery with unspecified angina pectoris: Secondary | ICD-10-CM

## 2023-07-27 DIAGNOSIS — R42 Dizziness and giddiness: Secondary | ICD-10-CM | POA: Diagnosis not present

## 2023-07-27 LAB — COMPREHENSIVE METABOLIC PANEL
ALT: 16 U/L (ref 0–44)
AST: 22 U/L (ref 15–41)
Albumin: 3.8 g/dL (ref 3.5–5.0)
Alkaline Phosphatase: 56 U/L (ref 38–126)
Anion gap: 10 (ref 5–15)
BUN: 30 mg/dL — ABNORMAL HIGH (ref 8–23)
CO2: 19 mmol/L — ABNORMAL LOW (ref 22–32)
Calcium: 8.8 mg/dL — ABNORMAL LOW (ref 8.9–10.3)
Chloride: 107 mmol/L (ref 98–111)
Creatinine, Ser: 2 mg/dL — ABNORMAL HIGH (ref 0.61–1.24)
GFR, Estimated: 32 mL/min — ABNORMAL LOW (ref >60)
Glucose, Bld: 96 mg/dL (ref 70–99)
Potassium: 4.9 mmol/L (ref 3.5–5.1)
Sodium: 136 mmol/L (ref 135–145)
Total Bilirubin: 0.9 mg/dL (ref 0.3–1.2)
Total Protein: 7.1 g/dL (ref 6.5–8.1)

## 2023-07-27 LAB — ECHOCARDIOGRAM COMPLETE
AV Mean grad: 16.4 mmHg
AV Peak grad: 29.5 mmHg
Ao pk vel: 2.71 m/s
Area-P 1/2: 2.69 cm2
Height: 67 in
S' Lateral: 3.2 cm
Weight: 2560 [oz_av]

## 2023-07-27 LAB — CBG MONITORING, ED: Glucose-Capillary: 83 mg/dL (ref 70–99)

## 2023-07-27 LAB — CBC
HCT: 34.1 % — ABNORMAL LOW (ref 39.0–52.0)
Hemoglobin: 11 g/dL — ABNORMAL LOW (ref 13.0–17.0)
MCH: 32.7 pg (ref 26.0–34.0)
MCHC: 32.3 g/dL (ref 30.0–36.0)
MCV: 101.5 fL — ABNORMAL HIGH (ref 80.0–100.0)
Platelets: 182 10*3/uL (ref 150–400)
RBC: 3.36 MIL/uL — ABNORMAL LOW (ref 4.22–5.81)
RDW: 13.6 % (ref 11.5–15.5)
WBC: 7.3 10*3/uL (ref 4.0–10.5)
nRBC: 0 % (ref 0.0–0.2)

## 2023-07-27 LAB — TROPONIN I (HIGH SENSITIVITY): Troponin I (High Sensitivity): 44 ng/L — ABNORMAL HIGH (ref <18)

## 2023-07-27 MED ORDER — AMLODIPINE BESYLATE 10 MG PO TABS
10.0000 mg | ORAL_TABLET | Freq: Every day | ORAL | Status: DC
  Administered 2023-07-27 – 2023-07-28 (×2): 10 mg via ORAL
  Filled 2023-07-27 (×2): qty 1

## 2023-07-27 MED ORDER — DOXAZOSIN MESYLATE 2 MG PO TABS
2.0000 mg | ORAL_TABLET | Freq: Two times a day (BID) | ORAL | Status: DC
  Administered 2023-07-27 – 2023-07-28 (×4): 2 mg via ORAL
  Filled 2023-07-27 (×6): qty 1

## 2023-07-27 NOTE — Progress Notes (Signed)
PROGRESS NOTE    Rodney Wiley  ZOX:[id number] DOB: [birth year] DOA: 07/26/2023 PCP: Armando Gang, FNP   Assessment & Plan:   Principal Problem:   Dizziness Active Problems:   Nausea   Coronary artery disease involving native coronary artery of native heart with angina pectoris (HCC)   Chronic kidney disease, stage IV (severe) (HCC)   Pre-syncope   Essential hypertension   Hyperlipidemia LDL goal <70   S/P ascending aortic aneurysm repair   Chronic diastolic heart failure (HCC)   PSVT (paroxysmal supraventricular tachycardia)  Assessment and Plan: Nausea: etiology unclear. Zofran prn. No nausea currently. 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 Mesenteric [place] pending   Hx oc CAD: s[ CABG in March 2019. No significant arrhythmia on tele. Low nontrending troponin as per cardio. Cardio recs apprec    Pre-syncope: etiology unclear. Echo shows no acute changes as per cardio. Cardio recs apprec      CKDIV: baseline Cr 2.3-2.9. Cr is better than baseline    Paroxysmal supraventricular tachycardia: continue on amio   Chronic diastolic CHF: echo March 2024 with EF of 50 to 55% and grade 1 diastolic dysfunction. Appears euvolemic. Monitor I/Os   S/P ascending aortic aneurysm repair: in March 2019. Managed by Dr. Okey Dupre outpatient   HLD: continue on stsatin    HTN: continue on amlodipine, imdur      DVT prophylaxis: lovenox Code Status: full  Family Communication: Disposition Plan: depends on PT/OT recs (not consulted yet).  Level of care: Telemetry Cardiac Consultants:  Cardio   Procedures:   Antimicrobials:    Subjective: Pt c/o fatigue   Objective: Vitals:   07/27/23 0300 07/27/23 0400 07/27/23 0415 07/27/23 0500  BP: (!) 159/78 (!) 179/87  (!) 170/82  Pulse: 70 76  74  Resp: 18 16  20   Temp:   98.3 F (36.8 C)   TempSrc:   Oral   SpO2: 98% 99%  96%  Weight:      Height:       No intake or output data in the 24 hours ending 07/27/23 0826 Filed  Weights   07/26/23 0920  Weight: 72.6 kg    Examination:  General exam: Appears calm and comfortable  Respiratory system: Clear to auscultation. Respiratory effort normal. Cardiovascular system: S1 & S2 +. No  rubs, gallops or clicks.  Gastrointestinal system: Abdomen is nondistended, soft and nontender. Normal bowel sounds heard. Central nervous system: Alert and awake. Moves all extremities Psychiatry: Judgement and insight appears at baseline. Mood & affect appropriate.     Data Reviewed: I have personally reviewed following labs and imaging studies  CBC: Recent Labs  Lab 07/26/23 0926 07/27/23 0417  WBC 7.6 7.3  HGB 10.4* 11.0*  HCT 30.9* 34.1*  MCV 98.7 101.5*  PLT 165 182   Basic Metabolic Panel: Recent Labs  Lab 07/26/23 0926 07/27/23 0417  NA 133* 136  K 4.2 4.9  CL 109 107  CO2 17* 19*  GLUCOSE 133* 96  BUN 37* 30*  CREATININE 2.32* 2.00*  CALCIUM 8.7* 8.8*   GFR: Estimated Creatinine Clearance: 25.2 mL/min (A) (by C-G formula based on SCr of 2 mg/dL (H)). Liver Function Tests: Recent Labs  Lab 07/26/23 0926 07/27/23 0417  AST 20 22  ALT 17 16  ALKPHOS 53 56  BILITOT 0.6 0.9  PROT 6.8 7.1  ALBUMIN 3.6 3.8   No results for input(s): "LIPASE", "AMYLASE" in the last 168 hours. No results for input(s): "AMMONIA" in  the last 168 hours. Coagulation Profile: No results for input(s): "INR", "PROTIME" in the last 168 hours. Cardiac Enzymes: No results for input(s): "CKTOTAL", "CKMB", "CKMBINDEX", "TROPONINI" in the last 168 hours. BNP (last 3 results) No results for input(s): "PROBNP" in the last 8760 hours. HbA1C: No results for input(s): "HGBA1C" in the last 72 hours. CBG: Recent Labs  Lab 07/27/23 0736  GLUCAP 83   Lipid Profile: No results for input(s): "CHOL", "HDL", "LDLCALC", "TRIG", "CHOLHDL", "LDLDIRECT" in the last 72 hours. Thyroid Function Tests: No results for input(s): "TSH", "T4TOTAL", "FREET4", "T3FREE", "THYROIDAB" in the  last 72 hours. Anemia Panel: No results for input(s): "VITAMINB12", "FOLATE", "FERRITIN", "TIBC", "IRON", "RETICCTPCT" in the last 72 hours. Sepsis Labs: Recent Labs  Lab 07/26/23 1625  LATICACIDVEN 1.1    No results found for this or any previous visit (from the past 240 hour(s)).       Radiology Studies: DG Chest Port 1 View  Result Date: 07/26/2023 CLINICAL DATA:  87 year old male with hypertension, weakness/fatigue, diaphoretic, presyncope. EXAM: PORTABLE CHEST 1 VIEW COMPARISON:  Chest radiographs 11/19/2021 and earlier. FINDINGS: Seated AP view at 1013 hours. Lordotic positioning today. Chronic CABG and cardiac valve replacement, sternotomy. Stable cardiac size and mediastinal contours. Visualized tracheal air column is within normal limits. Allowing for portable technique the lungs are clear. No pneumothorax. No acute osseous abnormality identified. Negative visible bowel gas. IMPRESSION: No acute cardiopulmonary abnormality. Electronically Signed   By: Odessa Fleming M.D.   On: 07/26/2023 11:33   CT Head Wo Contrast  Result Date: 07/26/2023 CLINICAL DATA:  87 year old male with hypertension, weakness/fatigue, diaphoretic, presyncope. EXAM: CT HEAD WITHOUT CONTRAST TECHNIQUE: Contiguous axial images were obtained from the base of the skull through the vertex without intravenous contrast. RADIATION DOSE REDUCTION: This exam was performed according to the departmental dose-optimization program which includes automated exposure control, adjustment of the mA and/or kV according to patient size and/or use of iterative reconstruction technique. COMPARISON:  None Available. FINDINGS: Brain: No midline shift, ventriculomegaly, mass effect, evidence of mass lesion, intracranial hemorrhage or evidence of cortically based acute infarction. Small area of cortical and subcortical white matter encephalomalacia in the posterior right MCA territory on series 2, image 23 and coronal image 51, right parietal  lobe. But elsewhere largely normal for age gray-white differentiation. Vascular: Calcified atherosclerosis at the skull base. No suspicious intracranial vascular hyperdensity. Skull: No acute osseous abnormality identified. Sinuses/Orbits: Visualized paranasal sinuses and mastoids are well aerated. Other: No acute orbit or scalp soft tissue finding. IMPRESSION: 1. No acute intracranial abnormality. 2. 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 Chronic posterior right MCA territory infarct. Electronically Signed   By: Odessa Fleming M.D.   On: 07/26/2023 11:32        Scheduled Meds:  amiodarone  100 mg Oral Daily   amLODipine  5 mg Oral Daily   aspirin EC  81 mg Oral Daily   atorvastatin  40 mg Oral Daily   enoxaparin (LOVENOX) injection  30 mg Subcutaneous Q24H   gabapentin  100 mg Oral QHS   hydrALAZINE  100 mg Oral TID   isosorbide mononitrate  60 mg Oral Daily   pantoprazole (PROTONIX) IV  40 mg Intravenous Q12H   Continuous Infusions:   LOS: 0 days    Time spent: 35 mins     Charise Killian, MD [facility] Pager [phone]  If 7PM-7AM, please contact night-coverage [email or web address] 07/27/2023, 8:26 AM

## 2023-07-27 NOTE — Progress Notes (Signed)
Rounding Note    Patient Name: Rodney Wiley Date of Encounter: 07/27/2023  [facility] Cardiologist: Yvonne Kendall, MD   Subjective   Patient reports he is overall feeling better. NO further pre-syncope episodes. No chest pain. Tele unremarkable.   Inpatient Medications    Scheduled Meds:  amiodarone  100 mg Oral Daily   amLODipine  5 mg Oral Daily   aspirin EC  81 mg Oral Daily   atorvastatin  40 mg Oral Daily   enoxaparin (LOVENOX) injection  30 mg Subcutaneous Q24H   gabapentin  100 mg Oral QHS   hydrALAZINE  100 mg Oral TID   isosorbide mononitrate  60 mg Oral Daily   pantoprazole (PROTONIX) IV  40 mg Intravenous Q12H   Continuous Infusions:  PRN Meds: traMADol   Vital Signs    Vitals:   07/27/23 0400 07/27/23 0415 07/27/23 0500 07/27/23 0800  BP: (!) 179/87  (!) 170/82 (!) 164/84  Pulse: 76  74   Resp: 16  20   Temp:  98.3 F (36.8 C)  98.4 F (36.9 C)  TempSrc:  Oral  Oral  SpO2: 99%  96%   Weight:      Height:       No intake or output data in the 24 hours ending 07/27/23 0846    07/26/2023    9:20 AM 05/26/2023    9:20 AM 01/21/2023   12:47 PM  Last 3 Weights  Weight (lbs) 160 lb 168 lb 172 lb  Weight (kg) 72.576 kg 76.204 kg 78.019 kg      Telemetry    NSR HR 80s - Personally Reviewed  ECG    No new - Personally Reviewed  Physical Exam   GEN: No acute distress.   Neck: No JVD Cardiac: RRR, no murmurs, rubs, or gallops.  Respiratory: Clear to auscultation bilaterally. GI: Soft, nontender, non-distended  MS: No edema; No deformity. Neuro:  Nonfocal  Psych: Normal affect   Labs    High Sensitivity Troponin:   Recent Labs  Lab 07/26/23 0926 07/26/23 1115 07/27/23 0417  TROPONINIHS 26* 25* 44*     Chemistry Recent Labs  Lab 07/26/23 0926 07/27/23 0417  NA 133* 136  K 4.2 4.9  CL 109 107  CO2 17* 19*  GLUCOSE 133* 96  BUN 37* 30*  CREATININE 2.32* 2.00*  CALCIUM 8.7* 8.8*  PROT 6.8 7.1  ALBUMIN 3.6  3.8  AST 20 22  ALT 17 16  ALKPHOS 53 56  BILITOT 0.6 0.9  GFRNONAA 27* 32*  ANIONGAP 7 10    Lipids No results for input(s): "CHOL", "TRIG", "HDL", "LABVLDL", "LDLCALC", "CHOLHDL" in the last 168 hours.  Hematology Recent Labs  Lab 07/26/23 0926 07/27/23 0417  WBC 7.6 7.3  RBC 3.13* 3.36*  HGB 10.4* 11.0*  HCT 30.9* 34.1*  MCV 98.7 101.5*  MCH 33.2 32.7  MCHC 33.7 32.3  RDW 13.7 13.6  PLT 165 182   Thyroid No results for input(s): "TSH", "FREET4" in the last 168 hours.  BNP Recent Labs  Lab 07/26/23 0926  BNP 124.1*    DDimer No results for input(s): "DDIMER" in the last 168 hours.   Radiology    DG Chest Port 1 View  Result Date: 07/26/2023 CLINICAL DATA:  87 year old male with hypertension, weakness/fatigue, diaphoretic, presyncope. EXAM: PORTABLE CHEST 1 VIEW COMPARISON:  Chest radiographs 11/19/2021 and earlier. FINDINGS: Seated AP view at 1013 hours. Lordotic positioning today. Chronic CABG and cardiac valve replacement, sternotomy.  Stable cardiac size and mediastinal contours. Visualized tracheal air column is within normal limits. Allowing for portable technique the lungs are clear. No pneumothorax. No acute osseous abnormality identified. Negative visible bowel gas. IMPRESSION: No acute cardiopulmonary abnormality. Electronically Signed   By: Odessa Fleming M.D.   On: 07/26/2023 11:33   CT Head Wo Contrast  Result Date: 07/26/2023 CLINICAL DATA:  87 year old male with hypertension, weakness/fatigue, diaphoretic, presyncope. EXAM: CT HEAD WITHOUT CONTRAST TECHNIQUE: Contiguous axial images were obtained from the base of the skull through the vertex without intravenous contrast. RADIATION DOSE REDUCTION: This exam was performed according to the departmental dose-optimization program which includes automated exposure control, adjustment of the mA and/or kV according to patient size and/or use of iterative reconstruction technique. COMPARISON:  None Available. FINDINGS: Brain:  No midline shift, ventriculomegaly, mass effect, evidence of mass lesion, intracranial hemorrhage or evidence of cortically based acute infarction. Small area of cortical and subcortical white matter encephalomalacia in the posterior right MCA territory on series 2, image 23 and coronal image 51, right parietal lobe. But elsewhere largely normal for age gray-white differentiation. Vascular: Calcified atherosclerosis at the skull base. No suspicious intracranial vascular hyperdensity. Skull: No acute osseous abnormality identified. Sinuses/Orbits: Visualized paranasal sinuses and mastoids are well aerated. Other: No acute orbit or scalp soft tissue finding. IMPRESSION: 1. No acute intracranial abnormality. 2. 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 Chronic posterior right MCA territory infarct. Electronically Signed   By: Odessa Fleming M.D.   On: 07/26/2023 11:32    Cardiac Studies   Echo 02/11/23 1. Left ventricular ejection fraction, by estimation, is 55 to 60%. The  left ventricle has normal function. The left ventricle demonstrates  regional wall motion abnormalities (septal wall hypokinesis possibly  secondary to post-operative state). There is   mild left ventricular hypertrophy. Left ventricular diastolic parameters  are consistent with Grade I diastolic dysfunction (impaired relaxation).  The average left ventricular global longitudinal strain is -12.6 %.   2. Right ventricular systolic function is normal. The right ventricular  size is normal. Tricuspid regurgitation signal is inadequate for assessing  PA pressure.   3. Left atrial size was moderately dilated.   4. The mitral valve is normal in structure. No evidence of mitral valve  regurgitation. No evidence of mitral stenosis.   5. The aortic valve has been repaired/replaced. Aortic valve  regurgitation is not visualized. No aortic stenosis is present. There is a  Edwards bioprosthetic valve present in the aortic position.  Procedure  Date: 02/2018. Aortic valve mean gradient  measures 18.7 mmHg. Aortic valve Vmax measures 2.95 m/s.   6. The inferior vena cava is normal in size with greater than 50%  respiratory variability, suggesting right atrial pressure of 3 mmHg.    Echo 03/17/21  1. Left ventricular ejection fraction, by estimation, is 60 to 65%. Left  ventricular ejection fraction by 3D volume is 69 %. The left ventricle has  normal function. The left ventricle has no regional wall motion  abnormalities. There is mild left  ventricular hypertrophy. Left ventricular diastolic parameters are  indeterminate.   2. Right ventricular systolic function is low normal. The right  ventricular size is normal. There is normal pulmonary artery systolic  pressure.   3. The mitral valve is normal in structure. Mild to moderate mitral valve  regurgitation.   4. The aortic valve has been repaired/replaced. Aortic valve  regurgitation is not visualized. There is a Edwards bioprosthetic valve  present in  the aortic position. Echo findings are consistent with normal  structure and function of the aortic valve  prosthesis. Aortic valve mean gradient measures 20.5 mmHg.   5. The inferior vena cava is normal in size with greater than 50%  respiratory variability, suggesting right atrial pressure of 3 mmHg.   Patient Profile     87 y.o. 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, SVT and frequent PACs, HFpEF, and CKD stage 4 with questionable left renal artery stenosis who is being seen 07/26/2023 for the evaluation of labile blood pressure   Assessment & Plan    Pre-syncope/generalized weakness - patient reports episodes of weakness, lightheadedness, nausea and diaphoresis for the last few weeks with intermittently elevated BP - denies chest pain or syncope - Scr 2, which is at  baseline - Hgb 10.4, WBC wnl - HS trop 26>25 - CT head and CXR non-acute - EKG shows NSR with no new changes - IV PPI per IM - echo pending - tele unremarkable - check RUQ [place] and mesenteric [place] (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 I will increase amlodipine to 10mg  daily. May need to increase Imdur   AVR - AVR in 2019 with CABG - appears euvolemic - echo in 02/2023 with stable valve, mean gradient 18.86mmHg - re-check echo   CKD stage 4 - SCr at baseline   Thoracic aortic aneurysm s/p Bentall procedure - echo as above   pSVT/PAC/PVC - continue amiodarone 100mg  daily - monitor tele  For questions or updates, please contact [facility] Please consult [email or web address] for contact info under        Signed, Raymundo Rout David Stall, PA-C  07/27/2023, 8:46 AM

## 2023-07-27 NOTE — ED Notes (Signed)
Ambulated to bathroom and back independently.

## 2023-07-28 DIAGNOSIS — I10 Essential (primary) hypertension: Secondary | ICD-10-CM | POA: Diagnosis not present

## 2023-07-28 DIAGNOSIS — R42 Dizziness and giddiness: Secondary | ICD-10-CM | POA: Diagnosis not present

## 2023-07-28 DIAGNOSIS — R110 Nausea: Secondary | ICD-10-CM | POA: Diagnosis not present

## 2023-07-28 DIAGNOSIS — N184 Chronic kidney disease, stage 4 (severe): Secondary | ICD-10-CM | POA: Diagnosis not present

## 2023-07-28 DIAGNOSIS — I25119 Atherosclerotic heart disease of native coronary artery with unspecified angina pectoris: Secondary | ICD-10-CM | POA: Diagnosis not present

## 2023-07-28 DIAGNOSIS — I5032 Chronic diastolic (congestive) heart failure: Secondary | ICD-10-CM

## 2023-07-28 LAB — CBC
HCT: 25.7 % — ABNORMAL LOW (ref 39.0–52.0)
Hemoglobin: 8.6 g/dL — ABNORMAL LOW (ref 13.0–17.0)
MCH: 32.8 pg (ref 26.0–34.0)
MCHC: 33.5 g/dL (ref 30.0–36.0)
MCV: 98.1 fL (ref 80.0–100.0)
Platelets: 148 10*3/uL — ABNORMAL LOW (ref 150–400)
RBC: 2.62 MIL/uL — ABNORMAL LOW (ref 4.22–5.81)
RDW: 13.6 % (ref 11.5–15.5)
WBC: 4.5 10*3/uL (ref 4.0–10.5)
nRBC: 0 % (ref 0.0–0.2)

## 2023-07-28 LAB — BASIC METABOLIC PANEL
Anion gap: 6 (ref 5–15)
BUN: 36 mg/dL — ABNORMAL HIGH (ref 8–23)
CO2: 20 mmol/L — ABNORMAL LOW (ref 22–32)
Calcium: 8.2 mg/dL — ABNORMAL LOW (ref 8.9–10.3)
Chloride: 108 mmol/L (ref 98–111)
Creatinine, Ser: 2.31 mg/dL — ABNORMAL HIGH (ref 0.61–1.24)
GFR, Estimated: 27 mL/min — ABNORMAL LOW (ref >60)
Glucose, Bld: 82 mg/dL (ref 70–99)
Potassium: 4.4 mmol/L (ref 3.5–5.1)
Sodium: 134 mmol/L — ABNORMAL LOW (ref 135–145)

## 2023-07-28 LAB — FERRITIN: Ferritin: 170 ng/mL (ref 24–336)

## 2023-07-28 LAB — IRON AND TIBC
Iron: 48 ug/dL (ref 45–182)
Saturation Ratios: 22 % (ref 17.9–39.5)
TIBC: 218 ug/dL — ABNORMAL LOW (ref 250–450)
UIBC: 170 ug/dL

## 2023-07-28 MED ORDER — AMLODIPINE BESYLATE 5 MG PO TABS
5.0000 mg | ORAL_TABLET | Freq: Every day | ORAL | Status: DC
  Administered 2023-07-29: 5 mg via ORAL
  Filled 2023-07-28: qty 1

## 2023-07-28 MED ORDER — ENALAPRIL MALEATE 10 MG PO TABS
10.0000 mg | ORAL_TABLET | Freq: Every day | ORAL | Status: DC
  Filled 2023-07-28: qty 1

## 2023-07-28 NOTE — Progress Notes (Addendum)
Transition of Care [facility]) - Inpatient Brief Assessment   Patient Details  Name: Rodney Wiley MRN: [id number] Date of Birth: [name] 19, 1936  [facility]) CM/SW Contact:    Truddie Hidden, RN Phone Number: 07/28/2023, 12:03 PM   Clinical Narrative: TOC continuing ongoing assessments for needs that may arise and changes in discharge plan.   Transition of Care Asessment: Insurance and Status: Insurance coverage has been reviewed Patient has primary care physician: Yes Home environment has been reviewed: Return to home Prior level of function:: Independent Prior/Current Home Services: No current home services Social Determinants of Health Reivew: SDOH reviewed no interventions necessary Readmission risk has been reviewed: Yes Transition of care needs: no transition of care needs at this time

## 2023-07-28 NOTE — Plan of Care (Signed)

## 2023-07-28 NOTE — Progress Notes (Signed)
PROGRESS NOTE    Rodney Wiley  ZOX:[id number] DOB: 02-17-1936 DOA: 07/26/2023 PCP: Rodney Gang, FNP   Assessment & Plan:   Principal Problem:   Lightheadedness Active Problems:   Nausea   Coronary artery disease involving native coronary artery of native heart with angina pectoris (HCC)   Chronic kidney disease, stage IV (severe) (HCC)   Pre-syncope   Essential hypertension   Hyperlipidemia LDL goal <70   S/P ascending aortic aneurysm repair   Chronic diastolic heart failure (HCC)   PSVT (paroxysmal supraventricular tachycardia)  Assessment and Plan: Nausea: etiology unclear. Zofran prn. No nausea currently. 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 Mesenteric US showed no evidence of hemodynamically significant stenosis & proximal abdominal aortic aneurysm measuring 3.0 cm. Recommend follow-up every 3 years and pt is aware & verbalized his understanding.   Hx of CAD: s/p CABG in March 2019. No significant arrhythmia on tele. Low nontrending troponin as per cardio. Cardio recs apprec    Pre-syncope: etiology unclear. Echo shows no acute changes as per cardio. Cardio recs apprec    CKDIV: baseline Cr 2.3-2.9. Cr is labile.   Likely ACD: likely secondary to CKD. H&H are labile. No need for a transfusion currently    Paroxysmal supraventricular tachycardia: continue on amio   Chronic diastolic CHF: echo March 2024 with EF of 50 to 55% and grade 1 diastolic dysfunction. Appears euvolemic. Torsemide prn as per cardio. Monitor I/Os   S/P ascending aortic aneurysm repair: in March 2019. Managed by Dr. Okey Wiley outpatient   HLD: continue on statin    HTN: continue on amlodipine, imdur, enalapril, hydralazine, cardura as per cardio       DVT prophylaxis: lovenox Code Status: full  Family Communication: Disposition Plan: d/c back home   Level of care: Telemetry Cardiac Consultants:  Cardio   Procedures:   Antimicrobials:    Subjective: Pt c/o  malaise  Objective: Vitals:   07/27/23 2000 07/27/23 2345 07/28/23 0411 07/28/23 0807  BP: (!) 102/58 137/72 130/64 127/73  Pulse: 83 77 76 86  Resp: 18 18 18    Temp: 97.9 F (36.6 C) 97.8 F (36.6 C) 98.2 F (36.8 C) 97.7 F (36.5 C)  TempSrc: Oral Oral Oral Oral  SpO2: 98% 98% 98% 99%  Weight:   72.9 kg   Height:       No intake or output data in the 24 hours ending 07/28/23 0854 Filed Weights   07/26/23 0920 07/28/23 0411  Weight: 72.6 kg 72.9 kg    Examination:  General exam: appears comfortable  Respiratory system: clear breath sounds b/l  Cardiovascular system: S1/S2+. No rubs or clicks  Gastrointestinal system: Abd is soft, NT, ND & normal bowel sounds Central nervous system: alert & awake. Moves all extremities  Psychiatry: judgement and insight appears at baseline. Appropriate mood and affect     Data Reviewed: I have personally reviewed following labs and imaging studies  CBC: Recent Labs  Lab 07/26/23 0926 07/27/23 0417 07/28/23 0501  WBC 7.6 7.3 4.5  HGB 10.4* 11.0* 8.6*  HCT 30.9* 34.1* 25.7*  MCV 98.7 101.5* 98.1  PLT 165 182 148*   Basic Metabolic Panel: Recent Labs  Lab 07/26/23 0926 07/27/23 0417 07/28/23 0501  NA 133* 136 134*  K 4.2 4.9 4.4  CL 109 107 108  CO2 17* 19* 20*  GLUCOSE 133* 96 82  BUN 37* 30* 36*  CREATININE 2.32* 2.00* 2.31*  CALCIUM 8.7* 8.8* 8.2*  GFR: Estimated Creatinine Clearance: 21.9 mL/min (A) (by C-G formula based on SCr of 2.31 mg/dL (H)). Liver Function Tests: Recent Labs  Lab 07/26/23 0926 07/27/23 0417  AST 20 22  ALT 17 16  ALKPHOS 53 56  BILITOT 0.6 0.9  PROT 6.8 7.1  ALBUMIN 3.6 3.8   No results for input(s): "LIPASE", "AMYLASE" in the last 168 hours. No results for input(s): "AMMONIA" in the last 168 hours. Coagulation Profile: No results for input(s): "INR", "PROTIME" in the last 168 hours. Cardiac Enzymes: No results for input(s): "CKTOTAL", "CKMB", "CKMBINDEX", "TROPONINI" in the  last 168 hours. BNP (last 3 results) No results for input(s): "PROBNP" in the last 8760 hours. HbA1C: No results for input(s): "HGBA1C" in the last 72 hours. CBG: Recent Labs  Lab 07/27/23 0736  GLUCAP 83   Lipid Profile: No results for input(s): "CHOL", "HDL", "LDLCALC", "TRIG", "CHOLHDL", "LDLDIRECT" in the last 72 hours. Thyroid Function Tests: No results for input(s): "TSH", "T4TOTAL", "FREET4", "T3FREE", "THYROIDAB" in the last 72 hours. Anemia Panel: No results for input(s): "VITAMINB12", "FOLATE", "FERRITIN", "TIBC", "IRON", "RETICCTPCT" in the last 72 hours. Sepsis Labs: Recent Labs  Lab 07/26/23 1625  LATICACIDVEN 1.1    No results found for this or any previous visit (from the past 240 hour(s)).       Radiology Studies: [place] MESENTERIC ARTERIES  Result Date: 07/27/2023 CLINICAL DATA:  Syncope Nausea Hypertension EXAM: [place] MESENTERIC ARTERIAL DOPPLER COMPARISON:  None available FINDINGS: Celiac axis: 188 cm/sec Celiac axis with inspiration: 117 cm/sec Celiac axis with expiration: 111 cm/sec Splenic artery: 79 cm/sec Hepatic artery: 158 cm/sec SMA: 210 cm/sec IMA: 187 cm/sec Aorta: 92 cm/sec Aortic size: 3.0 cm proximally, 1.9 cm in the mid segment and 1.7 cm distally IMPRESSION: 1. No evidence of hemodynamically significant visceral artery stenosis. 2. Proximal abdominal aortic aneurysm measuring 3.0 cm. Recommend follow-up every 3 years. This recommendation follows ACR consensus guidelines: White Paper of the ACR Incidental Findings Committee II on Vascular Findings. J Am Coll Radiol 2013; 10:789-794. Electronically Signed   By: Acquanetta Belling M.D.   On: 07/27/2023 15:55   ECHOCARDIOGRAM COMPLETE  Result Date: 07/27/2023    ECHOCARDIOGRAM REPORT   Patient Name:   Rodney Wiley Date of Exam: 07/26/2023 Medical Rec #:  [id number]       Height:       67.0 in Accession #:    [id number]      Weight:       160.0 lb Date of Birth:  [birth year]        BSA:          1.839 m Patient  Age:    87 years        BP:           176/81 mmHg Patient Gender: M               HR:           66 bpm. Exam Location:  [facility] Procedure: 2D Echo, Cardiac Doppler and Color Doppler Indications:     R55 Syncope  History:         Patient has prior history of Echocardiogram examinations, most                  recent 02/11/2023. CAD, AVR-21 mm Edwards Inspiris Resilia. and                  Prior CABG; Risk Factors:Hypertension and Dyslipidemia. Lower  extremity edema.  Sonographer:     Daphine Deutscher RDCS Referring Phys:  [id number] Francee Nodal FURTH Diagnosing Phys: Julien Nordmann MD IMPRESSIONS  1. Left ventricular ejection fraction, by estimation, is 55 to 60%. The left ventricle has normal function. The left ventricle has no regional wall motion abnormalities. There is moderate left ventricular hypertrophy. Left ventricular diastolic parameters are consistent with Grade II diastolic dysfunction (pseudonormalization).  2. Right ventricular systolic function is normal. The right ventricular size is normal.  3. Left atrial size was mildly dilated.  4. The mitral valve is normal in structure. Moderate mitral valve regurgitation. No evidence of mitral stenosis.  5. The aortic valve is normal in structure. Aortic valve regurgitation is not visualized. Mild aortic valve stenosis. Aortic valve mean gradient measures 16.4 mmHg. Aortic valve Vmax measures 2.71 m/s.  6. The inferior vena cava is normal in size with greater than 50% respiratory variability, suggesting right atrial pressure of 3 mmHg. FINDINGS  Left Ventricle: Left ventricular ejection fraction, by estimation, is 55 to 60%. The left ventricle has normal function. The left ventricle has no regional wall motion abnormalities. The left ventricular internal cavity size was normal in size. There is  moderate left ventricular hypertrophy. Left ventricular diastolic parameters are consistent with Grade II diastolic dysfunction (pseudonormalization).  Right Ventricle: The right ventricular size is normal. No increase in right ventricular wall thickness. Right ventricular systolic function is normal. Left Atrium: Left atrial size was mildly dilated. Right Atrium: Right atrial size was normal in size. Pericardium: There is no evidence of pericardial effusion. Mitral Valve: The mitral valve is normal in structure. There is mild calcification of the mitral valve leaflet(s). Moderate mitral valve regurgitation. No evidence of mitral valve stenosis. Tricuspid Valve: The tricuspid valve is normal in structure. Tricuspid valve regurgitation is mild . No evidence of tricuspid stenosis. Aortic Valve: The aortic valve is normal in structure. Aortic valve regurgitation is not visualized. Mild aortic stenosis is present. Aortic valve mean gradient measures 16.4 mmHg. Aortic valve peak gradient measures 29.5 mmHg. Pulmonic Valve: The pulmonic valve was normal in structure. Pulmonic valve regurgitation is not visualized. No evidence of pulmonic stenosis. Aorta: The aortic root is normal in size and structure. Venous: The inferior vena cava is normal in size with greater than 50% respiratory variability, suggesting right atrial pressure of 3 mmHg. IAS/Shunts: No atrial level shunt detected by color flow Doppler.  LEFT VENTRICLE PLAX 2D LVIDd:         4.40 cm Diastology LVIDs:         3.20 cm LV e' medial:    7.18 cm/s LV PW:         1.40 cm LV E/e' medial:  17.5 LV IVS:        1.40 cm LV e' lateral:   8.92 cm/s                        LV E/e' lateral: 14.1  RIGHT VENTRICLE             IVC RV Basal diam:  3.60 cm     IVC diam: 1.30 cm RV S prime:     14.05 cm/s TAPSE (M-mode): 2.7 cm LEFT ATRIUM             Index        RIGHT ATRIUM           Index LA diam:        5.10 cm  2.77 cm/m   RA Area:     14.20 cm LA Vol (A2C):   68.0 ml 36.97 ml/m  RA Volume:   35.30 ml  19.19 ml/m LA Vol (A4C):   71.5 ml 38.87 ml/m LA Biplane Vol: 70.9 ml 38.55 ml/m  AORTIC VALVE AV Vmax:            271.40 cm/s AV Vmean:          187.600 cm/s AV VTI:            0.524 m AV Peak Grad:      29.5 mmHg AV Mean Grad:      16.4 mmHg LVOT Vmax:         109.67 cm/s LVOT Vmean:        73.867 cm/s LVOT VTI:          0.228 m LVOT/AV VTI ratio: 0.44  AORTA Ao Root diam: 3.50 cm Ao Asc diam:  2.80 cm MITRAL VALVE MV Area (PHT): 2.69 cm     SHUNTS MV Decel Time: 282 msec     Systemic VTI: 0.23 m MV E velocity: 126.00 cm/s MV A velocity: 120.00 cm/s MV E/A ratio:  1.05 Julien Nordmann MD Electronically signed by Julien Nordmann MD Signature Date/Time: 07/27/2023/11:18:15 AM    Final    US Abdomen Limited RUQ (LIVER/GB)  Result Date: 07/27/2023 CLINICAL DATA:  Nausea.  Syncope. EXAM: ULTRASOUND ABDOMEN LIMITED RIGHT UPPER QUADRANT COMPARISON:  CT abdomen pelvis without contrast 03/04/2021 FINDINGS: Gallbladder: Dilated gallbladder. Dependent stones. No wall thickening or adjacent fluid no reported pain when scanning of the gallbladder Common bile duct: Diameter: 5 mm Liver: No focal lesion identified. Within normal limits in parenchymal echogenicity. Portal vein is patent on color Doppler imaging with normal direction of blood flow towards the liver. Other: None. IMPRESSION: Dilated gallbladder with stones but no wall thickening or adjacent fluid. No further sonographic evidence of acute cholecystitis. No ductal dilatation Electronically Signed   By: Karen Kays M.D.   On: 07/27/2023 09:51   DG Chest Port 1 View  Result Date: 07/26/2023 CLINICAL DATA:  87 year old male with hypertension, weakness/fatigue, diaphoretic, presyncope. EXAM: PORTABLE CHEST 1 VIEW COMPARISON:  Chest radiographs 11/19/2021 and earlier. FINDINGS: Seated AP view at 1013 hours. Lordotic positioning today. Chronic CABG and cardiac valve replacement, sternotomy. Stable cardiac size and mediastinal contours. Visualized tracheal air column is within normal limits. Allowing for portable technique the lungs are clear. No pneumothorax. No acute osseous  abnormality identified. Negative visible bowel gas. IMPRESSION: No acute cardiopulmonary abnormality. Electronically Signed   By: Odessa Fleming M.D.   On: 07/26/2023 11:33   CT Head Wo Contrast  Result Date: 07/26/2023 CLINICAL DATA:  87 year old male with hypertension, weakness/fatigue, diaphoretic, presyncope. EXAM: CT HEAD WITHOUT CONTRAST TECHNIQUE: Contiguous axial images were obtained from the base of the skull through the vertex without intravenous contrast. RADIATION DOSE REDUCTION: This exam was performed according to the departmental dose-optimization program which includes automated exposure control, adjustment of the mA and/or kV according to patient size and/or use of iterative reconstruction technique. COMPARISON:  None Available. FINDINGS: Brain: No midline shift, ventriculomegaly, mass effect, evidence of mass lesion, intracranial hemorrhage or evidence of cortically based acute infarction. Small area of cortical and subcortical white matter encephalomalacia in the posterior right MCA territory on series 2, image 23 and coronal image 51, right parietal lobe. But elsewhere largely normal for age gray-white differentiation. Vascular: Calcified atherosclerosis at the skull base. No suspicious intracranial vascular hyperdensity.  Skull: No acute osseous abnormality identified. Sinuses/Orbits: Visualized paranasal sinuses and mastoids are well aerated. Other: No acute orbit or scalp soft tissue finding. IMPRESSION: 1. No acute intracranial abnormality. 2. 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 Chronic posterior right MCA territory infarct. Electronically Signed   By: Odessa Fleming M.D.   On: 07/26/2023 11:32        Scheduled Meds:  amiodarone  100 mg Oral Daily   amLODipine  10 mg Oral Daily   aspirin EC  81 mg Oral Daily   atorvastatin  40 mg Oral Daily   doxazosin  2 mg Oral BID   enoxaparin (LOVENOX) injection  30 mg Subcutaneous Q24H   gabapentin  100 mg Oral QHS    hydrALAZINE  100 mg Oral TID   isosorbide mononitrate  60 mg Oral Daily   pantoprazole (PROTONIX) IV  40 mg Intravenous Q12H   Continuous Infusions:   LOS: 0 days    Time spent: 25 mins     Charise Killian, MD [facility] Pager [phone]  If 7PM-7AM, please contact night-coverage [email or web address] 07/28/2023, 8:54 AM

## 2023-07-28 NOTE — Progress Notes (Signed)
Cardiology Progress Note   Patient Name: Rodney Wiley Date of Encounter: 07/28/2023  Primary Cardiologist: Yvonne Kendall, MD  Subjective   Feels well this AM.  No chest pain or dyspnea.  Blood pressures much improved.  Eager to go home.  Inpatient Medications    Scheduled Meds:  amiodarone  100 mg Oral Daily   [START ON 07/29/2023] amLODipine  5 mg Oral Daily   aspirin EC  81 mg Oral Daily   atorvastatin  40 mg Oral Daily   doxazosin  2 mg Oral BID   [START ON 07/29/2023] enalapril  10 mg Oral Daily   enoxaparin (LOVENOX) injection  30 mg Subcutaneous Q24H   gabapentin  100 mg Oral QHS   hydrALAZINE  100 mg Oral TID   isosorbide mononitrate  60 mg Oral Daily   pantoprazole (PROTONIX) IV  40 mg Intravenous Q12H   Continuous Infusions:  PRN Meds: traMADol   Vital Signs    Vitals:   07/27/23 2000 07/27/23 2345 07/28/23 0411 07/28/23 0807  BP: (!) 102/58 137/72 130/64 127/73  Pulse: 83 77 76 86  Resp: 18 18 18 16   Temp: 97.9 F (36.6 C) 97.8 F (36.6 C) 98.2 F (36.8 C) 97.7 F (36.5 C)  TempSrc: Oral Oral Oral Oral  SpO2: 98% 98% 98% 99%  Weight:   72.9 kg   Height:       No intake or output data in the 24 hours ending 07/28/23 1311 Filed Weights   07/26/23 0920 07/28/23 0411  Weight: 72.6 kg 72.9 kg    Physical Exam   GEN: Well nourished, well developed, in no acute distress.  HEENT: Grossly normal.  Neck: Supple, no JVD, carotid bruits, or masses. Cardiac: RRR, no murmurs, rubs, or gallops. No clubbing, cyanosis, edema.  Radials 2+, DP/PT 2+ and equal bilaterally.  Respiratory:  Respirations regular and unlabored, clear to auscultation bilaterally. GI: Soft, nontender, nondistended, BS + x 4. MS: no deformity or atrophy. Skin: warm and dry, no rash. Neuro:  Strength and sensation are intact. Psych: AAOx3.  Normal affect.  Labs    Chemistry Recent Labs  Lab 07/26/23 0926 07/27/23 0417 07/28/23 0501  NA 133* 136 134*  K 4.2 4.9 4.4  CL  109 107 108  CO2 17* 19* 20*  GLUCOSE 133* 96 82  BUN 37* 30* 36*  CREATININE 2.32* 2.00* 2.31*  CALCIUM 8.7* 8.8* 8.2*  PROT 6.8 7.1  --   ALBUMIN 3.6 3.8  --   AST 20 22  --   ALT 17 16  --   ALKPHOS 53 56  --   BILITOT 0.6 0.9  --   GFRNONAA 27* 32* 27*  ANIONGAP 7 10 6      Hematology Recent Labs  Lab 07/26/23 0926 07/27/23 0417 07/28/23 0501  WBC 7.6 7.3 4.5  RBC 3.13* 3.36* 2.62*  HGB 10.4* 11.0* 8.6*  HCT 30.9* 34.1* 25.7*  MCV 98.7 101.5* 98.1  MCH 33.2 32.7 32.8  MCHC 33.7 32.3 33.5  RDW 13.7 13.6 13.6  PLT 165 182 148*    Cardiac Enzymes  Recent Labs  Lab 07/26/23 0926 07/26/23 1115 07/27/23 0417  TROPONINIHS 26* 25* 44*      BNP    Component Value Date/Time   BNP 124.1 (H) 07/26/2023 0926   Lipids  Lab Results  Component Value Date   CHOL 127 02/15/2022   HDL 38 (L) 02/15/2022   LDLCALC 63 02/15/2022   TRIG 148 02/15/2022  CHOLHDL 3.3 02/15/2022    HbA1c  Lab Results  Component Value Date   HGBA1C 5.2 02/20/2018    Radiology    [place] MESENTERIC ARTERIES  Result Date: 07/27/2023 CLINICAL DATA:  Syncope Nausea Hypertension EXAM: [place] MESENTERIC ARTERIAL DOPPLER COMPARISON:  None available FINDINGS: Celiac axis: 188 cm/sec Celiac axis with inspiration: 117 cm/sec Celiac axis with expiration: 111 cm/sec Splenic artery: 79 cm/sec Hepatic artery: 158 cm/sec SMA: 210 cm/sec IMA: 187 cm/sec Aorta: 92 cm/sec Aortic size: 3.0 cm proximally, 1.9 cm in the mid segment and 1.7 cm distally IMPRESSION: 1. No evidence of hemodynamically significant visceral artery stenosis. 2. Proximal abdominal aortic aneurysm measuring 3.0 cm. Recommend follow-up every 3 years. This recommendation follows ACR consensus guidelines: White Paper of the ACR Incidental Findings Committee II on Vascular Findings. J Am Coll Radiol 2013; 10:789-794. Electronically Signed   By: Acquanetta Belling M.D.   On: 07/27/2023 15:55   US Abdomen Limited RUQ (LIVER/GB)  Result Date:  07/27/2023 CLINICAL DATA:  Nausea.  Syncope. EXAM: ULTRASOUND ABDOMEN LIMITED RIGHT UPPER QUADRANT COMPARISON:  CT abdomen pelvis without contrast 03/04/2021 FINDINGS: Gallbladder: Dilated gallbladder. Dependent stones. No wall thickening or adjacent fluid no reported pain when scanning of the gallbladder Common bile duct: Diameter: 5 mm Liver: No focal lesion identified. Within normal limits in parenchymal echogenicity. Portal vein is patent on color Doppler imaging with normal direction of blood flow towards the liver. Other: None. IMPRESSION: Dilated gallbladder with stones but no wall thickening or adjacent fluid. No further sonographic evidence of acute cholecystitis. No ductal dilatation Electronically Signed   By: Karen Kays M.D.   On: 07/27/2023 09:51   DG Chest Port 1 View  Result Date: 07/26/2023 CLINICAL DATA:  87 year old male with hypertension, weakness/fatigue, diaphoretic, presyncope. EXAM: PORTABLE CHEST 1 VIEW COMPARISON:  Chest radiographs 11/19/2021 and earlier. FINDINGS: Seated AP view at 1013 hours. Lordotic positioning today. Chronic CABG and cardiac valve replacement, sternotomy. Stable cardiac size and mediastinal contours. Visualized tracheal air column is within normal limits. Allowing for portable technique the lungs are clear. No pneumothorax. No acute osseous abnormality identified. Negative visible bowel gas. IMPRESSION: No acute cardiopulmonary abnormality. Electronically Signed   By: Odessa Fleming M.D.   On: 07/26/2023 11:33   CT Head Wo Contrast  Result Date: 07/26/2023 CLINICAL DATA:  87 year old male with hypertension, weakness/fatigue, diaphoretic, presyncope. EXAM: CT HEAD WITHOUT CONTRAST TECHNIQUE: Contiguous axial images were obtained from the base of the skull through the vertex without intravenous contrast. RADIATION DOSE REDUCTION: This exam was performed according to the departmental dose-optimization program which includes automated exposure control, adjustment of the  mA and/or kV according to patient size and/or use of iterative reconstruction technique. COMPARISON:  None Available. FINDINGS: Brain: No midline shift, ventriculomegaly, mass effect, evidence of mass lesion, intracranial hemorrhage or evidence of cortically based acute infarction. Small area of cortical and subcortical white matter encephalomalacia in the posterior right MCA territory on series 2, image 23 and coronal image 51, right parietal lobe. But elsewhere largely normal for age gray-white differentiation. Vascular: Calcified atherosclerosis at the skull base. No suspicious intracranial vascular hyperdensity. Skull: No acute osseous abnormality identified. Sinuses/Orbits: Visualized paranasal sinuses and mastoids are well aerated. Other: No acute orbit or scalp soft tissue finding. IMPRESSION: 1. No acute intracranial abnormality. 2. 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 Chronic posterior right MCA territory infarct. Electronically Signed   By: Odessa Fleming M.D.   On: 07/26/2023 11:32    Telemetry  RSR, 70's - 100, 1st deg avb - Personally Reviewed  Cardiac Studies   2D Echocardiogram 8.20.2024  1. Left ventricular ejection fraction, by estimation, is 55 to 60%. The  left ventricle has normal function. The left ventricle has no regional  wall motion abnormalities. There is moderate left ventricular hypertrophy.  Left ventricular diastolic  parameters are consistent with Grade II diastolic dysfunction  (pseudonormalization).   2. Right ventricular systolic function is normal. The right ventricular  size is normal.   3. Left atrial size was mildly dilated.   4. The mitral valve is normal in structure. Moderate mitral valve  regurgitation. No evidence of mitral stenosis.   5. The aortic valve is normal in structure. Aortic valve regurgitation is  not visualized. Mild aortic valve stenosis. Aortic valve mean gradient  measures 16.4 mmHg. Aortic valve Vmax measures 2.71  m/s.   6. The inferior vena cava is normal in size with greater than 50%  respiratory variability, suggesting right atrial pressure of 3 mmHg.  _____________   Patient Profile     87 y.o. 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  Weakness/Near syncope:  Presented w/ wkns, lightheadedness, n, diaphoresis, x several wks. acute kidney injury with mild, relatively flat troponin elevation noted.  Echo with normal LV function and grade 2 diastolic dysfunction.  Moderate MR noted.  No significant arrhythmias on telemetry.  Presentation does not appear to be consistent with a cardiac etiology.  No further cardiac workup at this time.  3.  Essential hypertension: Blood pressures elevated earlier this admission with significant improvement following titration of medications.  With prior history of lymphedema, we will reduce amlodipine back to 5 mg daily.  Continue hydralazine 100 mg 3 times daily, Imdur 60 mg daily, Cardura 2 mg twice daily (previously discontinued in the outpatient setting), and add back enalapril 10 mg daily today.  3.  CAD: Status post CABG 2019.  No chest pain.  Minimal troponin elevation without significant trend and normal LV function without wall motion abnormalities by echo.  No plans for ischemic evaluation at this time.  Continue aspirin, statin, and nitrate therapy.  4.  Normocytic anemia: H&H low this morning at 8.6 and 25.7.  Patient with prior history of iron deficiency.  Iron studies ordered.  Patient has required IV iron in the outpatient setting in the past.  5.  Status post AVR: Stable gradient of 16 mmHg on echo.  Unchanged since 2022.  6.  PSVT:  Quiescent on amiodarone 100 mg daily.  7.  Chronic stage III-IV kidney disease: Creatinine relatively stable at 2.31.  Resuming enalapril for renal protection and hypertension management.  Signed, Nicolasa Ducking, NP  07/28/2023, 1:11 PM    For questions or updates, please contact   Please consult [email or web address] for contact info under Cardiology/STEMI. d

## 2023-07-28 NOTE — Evaluation (Signed)
Physical Therapy Evaluation Patient Details Name: Rodney Wiley MRN: [id number] DOB: [birth year] Today's Date: 07/28/2023  History of Present Illness  Rodney Wiley is a 87 y.o. 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  Daughter reports patient with recurrent nausea over the past week or so  Clinical Impression  Patient received in recliner, daughter in law present in room. Patient is agreeable to PT assessment. He stands without assist. Ambulated 150 feet with RW and supervision. No difficulties other than chronic knee pain. Patient is at baseline mobility and has no skilled needs at this time. Signing off.          If plan is discharge home, recommend the following:  No needs   Can travel by private vehicle    yes    Equipment Recommendations None recommended by PT  Recommendations for Other Services       Functional Status Assessment Patient has not had a recent decline in their functional status     Precautions / Restrictions Precautions Precaution Comments: mod fall Restrictions Weight Bearing Restrictions: No      Mobility  Bed Mobility               General bed mobility comments: NT patient in recliner and remained sitting on edge of bed at end of session    Transfers Overall transfer level: Independent Equipment used: Rolling walker (2 wheels)                    Ambulation/Gait Ambulation/Gait assistance: Supervision Gait Distance (Feet): 150 Feet Assistive device: Rolling walker (2 wheels) Gait Pattern/deviations: Step-through pattern Gait velocity: WFL     General Gait Details: no difficuties noted, mild L knee pain that  is chronic. No lob  Stairs            Wheelchair Mobility     Tilt Bed    Modified Rankin (Stroke Patients Only)       Balance Overall balance assessment: Modified Independent                                           Pertinent Vitals/Pain Pain Assessment Pain Assessment: Faces Faces Pain Scale: Hurts a little bit Pain Location: L knee, chronic pain Pain Descriptors / Indicators: Discomfort Pain Intervention(s): Monitored during session    Home Living Family/patient expects to be discharged to:: Private residence Living Arrangements: Alone Available Help at Discharge: Family;Available PRN/intermittently Type of Home: House Home Access: Level entry       Home Layout: One level Home Equipment: Agricultural consultant (2 wheels);Rollator (4 wheels);Shower seat - built in      Prior Function Prior Level of Function : Independent/Modified Independent;Driving             Mobility Comments: uses walker as needed ADLs Comments: independent     Extremity/Trunk Assessment   Upper Extremity Assessment Upper Extremity Assessment: Overall WFL for tasks assessed    Lower Extremity Assessment Lower Extremity Assessment: Overall WFL for tasks assessed    Cervical / Trunk Assessment Cervical / Trunk Assessment: Normal  Communication   Communication Communication: No apparent difficulties Cueing  Techniques: Verbal cues  Cognition Arousal: Alert Behavior During Therapy: WFL for tasks assessed/performed Overall Cognitive Status: Within Functional Limits for tasks assessed                                          General Comments      Exercises     Assessment/Plan    PT Assessment Patient does not need any further PT services  PT Problem List         PT Treatment Interventions      PT Goals (Current goals can be found in the Care Plan section)  Acute Rehab PT Goals Patient Stated Goal: return home PT Goal  Formulation: With patient/family Time For Goal Achievement: 07/29/23 Potential to Achieve Goals: Good    Frequency       Co-evaluation               AM-PAC PT "6 Clicks" Mobility  Outcome Measure Help needed turning from your back to your side while in a flat bed without using bedrails?: None Help needed moving from lying on your back to sitting on the side of a flat bed without using bedrails?: None Help needed moving to and from a bed to a chair (including a wheelchair)?: None Help needed standing up from a chair using your arms (e.g., wheelchair or bedside chair)?: None Help needed to walk in hospital room?: None Help needed climbing 3-5 steps with a railing? : None 6 Click Score: 24    End of Session   Activity Tolerance: Patient tolerated treatment well Patient left: Other (comment);with family/visitor present (seated on side of bed) Nurse Communication: Mobility status      Time: 5366-4403 PT Time Calculation (min) (ACUTE ONLY): 8 min   Charges:   PT Evaluation $PT Eval Low Complexity: 1 Low   PT General Charges $$ ACUTE PT VISIT: 1 Visit         Lory Galan, PT, GCS 07/28/23,10:17 AM

## 2023-07-29 DIAGNOSIS — I10 Essential (primary) hypertension: Secondary | ICD-10-CM | POA: Diagnosis not present

## 2023-07-29 LAB — BASIC METABOLIC PANEL
Anion gap: 12 (ref 5–15)
BUN: 43 mg/dL — ABNORMAL HIGH (ref 8–23)
CO2: 18 mmol/L — ABNORMAL LOW (ref 22–32)
Calcium: 8.3 mg/dL — ABNORMAL LOW (ref 8.9–10.3)
Chloride: 107 mmol/L (ref 98–111)
Creatinine, Ser: 2.49 mg/dL — ABNORMAL HIGH (ref 0.61–1.24)
GFR, Estimated: 25 mL/min — ABNORMAL LOW (ref >60)
Glucose, Bld: 82 mg/dL (ref 70–99)
Potassium: 4.8 mmol/L (ref 3.5–5.1)
Sodium: 137 mmol/L (ref 135–145)

## 2023-07-29 LAB — CBC
HCT: 25.2 % — ABNORMAL LOW (ref 39.0–52.0)
Hemoglobin: 8.5 g/dL — ABNORMAL LOW (ref 13.0–17.0)
MCH: 32.7 pg (ref 26.0–34.0)
MCHC: 33.7 g/dL (ref 30.0–36.0)
MCV: 96.9 fL (ref 80.0–100.0)
Platelets: 148 10*3/uL — ABNORMAL LOW (ref 150–400)
RBC: 2.6 MIL/uL — ABNORMAL LOW (ref 4.22–5.81)
RDW: 13.6 % (ref 11.5–15.5)
WBC: 5.3 10*3/uL (ref 4.0–10.5)
nRBC: 0 % (ref 0.0–0.2)

## 2023-07-29 MED ORDER — DOXAZOSIN MESYLATE 2 MG PO TABS: 2.0000 mg | ORAL_TABLET | Freq: Two times a day (BID) | ORAL | 0 refills | Status: DC

## 2023-07-29 MED ORDER — PANTOPRAZOLE SODIUM 40 MG PO TBEC
40.0000 mg | DELAYED_RELEASE_TABLET | Freq: Two times a day (BID) | ORAL | Status: DC
  Administered 2023-07-29: 40 mg via ORAL
  Filled 2023-07-29: qty 1

## 2023-07-29 MED ORDER — ORAL CARE MOUTH RINSE: 15.0000 mL | OROMUCOSAL | Status: DC | PRN

## 2023-07-29 NOTE — Plan of Care (Signed)

## 2023-07-29 NOTE — TOC Progression Note (Signed)
Transition of Care [facility]) - Progression Note    Patient Details  Name: Rodney Wiley MRN: [id number] Date of Birth: 1936-10-26  Transition of Care [facility]) CM/SW Contact  Truddie Hidden, RN Phone Number: 07/29/2023, 12:41 PM  Clinical Narrative:    TOC continuing ongoing assessments for needs that may arise and changes in discharge plan.        Expected Discharge Plan and Services                                               Social Determinants of Health (SDOH) Interventions SDOH Screenings   Food Insecurity: No Food Insecurity (07/26/2023)  Housing: Low Risk  (07/26/2023)  Transportation Needs: No Transportation Needs (07/26/2023)  Utilities: Not At Risk (07/26/2023)  Tobacco Use: Low Risk  (07/26/2023)    Readmission Risk Interventions     No data to display

## 2023-07-29 NOTE — Progress Notes (Signed)
Patient is ready for discharge he has all of his belongings with him. I have reviewed all of his discharge instructions with he and his daughter-in-law who is in the room with him. I have removed patients IV without complication. Patient has been taken off telemetry. He will be taken downstairs by wheelchair with a volunteer to meet his daughter-in-law who will transport him home.

## 2023-07-29 NOTE — Discharge Summary (Signed)
Physician Discharge Summary  Rodney Wiley WUJ:[id number] DOB: 29-Sep-1936 DOA: 07/26/2023  PCP: Armando Gang, FNP  Admit date: 07/26/2023 Discharge date: 07/29/2023  Admitted From: home  Disposition:  home  Recommendations for Outpatient Follow-up:  Follow up with PCP in 1 week  Please obtain CBC w/in 1 week  F/u w/ cardio, Dr. Okey Dupre, in 1-2 weeks   Home Health: no  Equipment/Devices:  Discharge Condition: stable  CODE STATUS: full  Diet recommendation: Heart Healthy  Brief/Interim Summary: HPI was taken from Dr. Alvester Morin:  Rodney Wiley is a 87 y.o. 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  Daughter reports patient with recurrent nausea over the past week or so.  Has had recurrent episodes of nausea, diaphoresis and malaise.  No true chest pain.  No diarrhea.  No reported emesis.  Had been evaluated by nephrology within the past week with noted low blood pressure.  No medication adjustment at that time.  Per the daughter, blood pressure has remained fairly elevated at home.  No focal hemiparesis or confusion.  No reported falls.  Has had decreased p.o. intake secondary to episodes of recurrent nausea.  No frank abdominal pain.  Follows Dr. Okey Dupre outpatient from a cardiology standpoint. Presented to the ER afebrile, hemodynamically stable.  Satting well on room air.  White count 7.6, hemoglobin 10.4, platelets 165, troponin 26-25.  Creatinine 2.32, bicarb 17.  CT head chest x-ray grossly stable.  EKG is normal sinus rhythm with first-degree AV block  Discharge Diagnoses:  Principal Problem:   Lightheadedness Active Problems:   Nausea   Coronary artery disease involving  native coronary artery of native heart with angina pectoris (HCC)   Chronic kidney disease, stage IV (severe) (HCC)   Pre-syncope   Essential hypertension   Hyperlipidemia LDL goal <70   S/P ascending aortic aneurysm repair   Chronic diastolic heart failure (HCC)   PSVT (paroxysmal supraventricular tachycardia) Nausea: etiology unclear. Zofran prn. No nausea currently. 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 Mesenteric US showed no evidence of hemodynamically significant stenosis & proximal abdominal aortic aneurysm measuring 3.0 cm. Recommend follow-up every 3 years and pt is aware & verbalized his understanding.   Hx of CAD: s/p CABG in March 2019. No significant arrhythmia on tele. Low nontrending troponin as per cardio. Cardio recs apprec    Pre-syncope: etiology unclear. Echo shows no acute changes as per cardio. Cardio recs apprec    CKDIV: baseline Cr 2.3-2.9. Cr is labile.   Likely ACD: likely secondary to CKD. H&H are labile. No need for a transfusion currently    Paroxysmal supraventricular tachycardia: continue on amio   Chronic diastolic CHF: echo March 2024 with EF of 50 to 55% and grade 1 diastolic dysfunction. Appears euvolemic. Torsemide prn as per cardio. Monitor I/Os   S/P ascending aortic aneurysm repair: in March 2019. Managed by Dr. Okey Dupre outpatient   HLD: continue on statin    HTN: continue on amlodipine, imdur, enalapril, hydralazine, cardura as per cardio   Discharge Instructions  Discharge Instructions     Diet - low sodium heart healthy   Complete by: As directed    Discharge instructions   Complete by: As directed    F/u  w/ PCP in 1 week. Needs to get CBC to check H&H within 1 week. F/u w/ cardio, Dr. Okey Dupre, in 1-2 weeks   Increase activity slowly   Complete by: As directed       Allergies as of 07/29/2023   No Known Allergies      Medication List     STOP taking these medications    ascorbic acid 500 MG tablet Commonly known as:  VITAMIN C   ferrous sulfate 325 (65 FE) MG EC tablet   methocarbamol 500 MG tablet Commonly known as: ROBAXIN   omeprazole 20 MG capsule Commonly known as: PRILOSEC       TAKE these medications    amiodarone 100 MG tablet Commonly known as: PACERONE TAKE 1 TABLET BY MOUTH EVERY DAY   amLODipine 10 MG tablet Commonly known as: NORVASC Take 5 mg by mouth daily.   aspirin EC 81 MG tablet Take 81 mg by mouth daily.   atorvastatin 40 MG tablet Commonly known as: LIPITOR TAKE 1 TABLET BY MOUTH EVERY DAY   cholecalciferol 25 MCG (1000 UNIT) tablet Commonly known as: VITAMIN D3 Take 1,000 Units by mouth daily.   doxazosin 2 MG tablet Commonly known as: CARDURA Take 1 tablet (2 mg total) by mouth 2 (two) times daily. What changed:  medication strength how much to take when to take this   enalapril 10 MG tablet Commonly known as: VASOTEC Take 10 mg by mouth daily.   Farxiga 10 MG Tabs tablet Generic drug: dapagliflozin propanediol Take 1 tablet (10 mg total) by mouth daily.   gabapentin 100 MG capsule Commonly known as: NEURONTIN Take 100 mg by mouth at bedtime.   hydrALAZINE 100 MG tablet Commonly known as: APRESOLINE TAKE 1 TABLET BY MOUTH THREE TIMES A DAY   isosorbide mononitrate 60 MG 24 hr tablet Commonly known as: IMDUR TAKE 1 TABLET BY MOUTH EVERY DAY   levothyroxine 25 MCG tablet Commonly known as: SYNTHROID Take 25 mcg by mouth every morning.   nitroGLYCERIN 0.4 MG SL tablet Commonly known as: Nitrostat Place 1 tablet (0.4 mg total) under the tongue every 5 (five) minutes as needed for chest pain. Maximum of 3 doses.   torsemide 10 MG tablet Commonly known as: DEMADEX TAKE 1 TABLET BY MOUTH EVERY DAY AS NEEDED   traMADol 50 MG tablet Commonly known as: ULTRAM Take 50 mg by mouth every 6 (six) hours as needed.        No Known Allergies  Consultations: Cardio   Procedures/Studies: [place] MESENTERIC ARTERIES  Result Date:  07/27/2023 CLINICAL DATA:  Syncope Nausea Hypertension EXAM: [place] MESENTERIC ARTERIAL DOPPLER COMPARISON:  None available FINDINGS: Celiac axis: 188 cm/sec Celiac axis with inspiration: 117 cm/sec Celiac axis with expiration: 111 cm/sec Splenic artery: 79 cm/sec Hepatic artery: 158 cm/sec SMA: 210 cm/sec IMA: 187 cm/sec Aorta: 92 cm/sec Aortic size: 3.0 cm proximally, 1.9 cm in the mid segment and 1.7 cm distally IMPRESSION: 1. No evidence of hemodynamically significant visceral artery stenosis. 2. Proximal abdominal aortic aneurysm measuring 3.0 cm. Recommend follow-up every 3 years. This recommendation follows ACR consensus guidelines: White Paper of the ACR Incidental Findings Committee II on Vascular Findings. J Am Coll Radiol 2013; 10:789-794. Electronically Signed   By: Acquanetta Belling M.D.   On: 07/27/2023 15:55   ECHOCARDIOGRAM COMPLETE  Result Date: 07/27/2023    ECHOCARDIOGRAM REPORT   Patient Name:   Rodney Wiley Date of Exam: 07/26/2023 Medical Rec #:  [id number]  Height:       67.0 in Accession #:    [id number]      Weight:       160.0 lb Date of Birth:  1936-01-28        BSA:          1.839 m Patient Age:    87 years        BP:           176/81 mmHg Patient Gender: M               HR:           66 bpm. Exam Location:  [facility] Procedure: 2D Echo, Cardiac Doppler and Color Doppler Indications:     R55 Syncope  History:         Patient has prior history of Echocardiogram examinations, most                  recent 02/11/2023. CAD, AVR-21 mm Edwards Inspiris Resilia. and                  Prior CABG; Risk Factors:Hypertension and Dyslipidemia. Lower                  extremity edema.  Sonographer:     Daphine Deutscher RDCS Referring Phys:  [id number] Francee Nodal FURTH Diagnosing Phys: Julien Nordmann MD IMPRESSIONS  1. Left ventricular ejection fraction, by estimation, is 55 to 60%. The left ventricle has normal function. The left ventricle has no regional wall motion abnormalities. There is moderate left  ventricular hypertrophy. Left ventricular diastolic parameters are consistent with Grade II diastolic dysfunction (pseudonormalization).  2. Right ventricular systolic function is normal. The right ventricular size is normal.  3. Left atrial size was mildly dilated.  4. The mitral valve is normal in structure. Moderate mitral valve regurgitation. No evidence of mitral stenosis.  5. The aortic valve is normal in structure. Aortic valve regurgitation is not visualized. Mild aortic valve stenosis. Aortic valve mean gradient measures 16.4 mmHg. Aortic valve Vmax measures 2.71 m/s.  6. The inferior vena cava is normal in size with greater than 50% respiratory variability, suggesting right atrial pressure of 3 mmHg. FINDINGS  Left Ventricle: Left ventricular ejection fraction, by estimation, is 55 to 60%. The left ventricle has normal function. The left ventricle has no regional wall motion abnormalities. The left ventricular internal cavity size was normal in size. There is  moderate left ventricular hypertrophy. Left ventricular diastolic parameters are consistent with Grade II diastolic dysfunction (pseudonormalization). Right Ventricle: The right ventricular size is normal. No increase in right ventricular wall thickness. Right ventricular systolic function is normal. Left Atrium: Left atrial size was mildly dilated. Right Atrium: Right atrial size was normal in size. Pericardium: There is no evidence of pericardial effusion. Mitral Valve: The mitral valve is normal in structure. There is mild calcification of the mitral valve leaflet(s). Moderate mitral valve regurgitation. No evidence of mitral valve stenosis. Tricuspid Valve: The tricuspid valve is normal in structure. Tricuspid valve regurgitation is mild . No evidence of tricuspid stenosis. Aortic Valve: The aortic valve is normal in structure. Aortic valve regurgitation is not visualized. Mild aortic stenosis is present. Aortic valve mean gradient measures 16.4  mmHg. Aortic valve peak gradient measures 29.5 mmHg. Pulmonic Valve: The pulmonic valve was normal in structure. Pulmonic valve regurgitation is not visualized. No evidence of pulmonic stenosis. Aorta: The aortic root is normal in size and structure. Venous: The inferior vena  cava is normal in size with greater than 50% respiratory variability, suggesting right atrial pressure of 3 mmHg. IAS/Shunts: No atrial level shunt detected by color flow Doppler.  LEFT VENTRICLE PLAX 2D LVIDd:         4.40 cm Diastology LVIDs:         3.20 cm LV e' medial:    7.18 cm/s LV PW:         1.40 cm LV E/e' medial:  17.5 LV IVS:        1.40 cm LV e' lateral:   8.92 cm/s                        LV E/e' lateral: 14.1  RIGHT VENTRICLE             IVC RV Basal diam:  3.60 cm     IVC diam: 1.30 cm RV S prime:     14.05 cm/s TAPSE (M-mode): 2.7 cm LEFT ATRIUM             Index        RIGHT ATRIUM           Index LA diam:        5.10 cm 2.77 cm/m   RA Area:     14.20 cm LA Vol (A2C):   68.0 ml 36.97 ml/m  RA Volume:   35.30 ml  19.19 ml/m LA Vol (A4C):   71.5 ml 38.87 ml/m LA Biplane Vol: 70.9 ml 38.55 ml/m  AORTIC VALVE AV Vmax:           271.40 cm/s AV Vmean:          187.600 cm/s AV VTI:            0.524 m AV Peak Grad:      29.5 mmHg AV Mean Grad:      16.4 mmHg LVOT Vmax:         109.67 cm/s LVOT Vmean:        73.867 cm/s LVOT VTI:          0.228 m LVOT/AV VTI ratio: 0.44  AORTA Ao Root diam: 3.50 cm Ao Asc diam:  2.80 cm MITRAL VALVE MV Area (PHT): 2.69 cm     SHUNTS MV Decel Time: 282 msec     Systemic VTI: 0.23 m MV E velocity: 126.00 cm/s MV A velocity: 120.00 cm/s MV E/A ratio:  1.05 Julien Nordmann MD Electronically signed by Julien Nordmann MD Signature Date/Time: 07/27/2023/11:18:15 AM    Final    US Abdomen Limited RUQ (LIVER/GB)  Result Date: 07/27/2023 CLINICAL DATA:  Nausea.  Syncope. EXAM: ULTRASOUND ABDOMEN LIMITED RIGHT UPPER QUADRANT COMPARISON:  CT abdomen pelvis without contrast 03/04/2021 FINDINGS: Gallbladder:  Dilated gallbladder. Dependent stones. No wall thickening or adjacent fluid no reported pain when scanning of the gallbladder Common bile duct: Diameter: 5 mm Liver: No focal lesion identified. Within normal limits in parenchymal echogenicity. Portal vein is patent on color Doppler imaging with normal direction of blood flow towards the liver. Other: None. IMPRESSION: Dilated gallbladder with stones but no wall thickening or adjacent fluid. No further sonographic evidence of acute cholecystitis. No ductal dilatation Electronically Signed   By: Karen Kays M.D.   On: 07/27/2023 09:51   DG Chest Port 1 View  Result Date: 07/26/2023 CLINICAL DATA:  87 year old male with hypertension, weakness/fatigue, diaphoretic, presyncope. EXAM: PORTABLE CHEST 1 VIEW COMPARISON:  Chest radiographs 11/19/2021 and earlier. FINDINGS: Seated AP view  at 1013 hours. Lordotic positioning today. Chronic CABG and cardiac valve replacement, sternotomy. Stable cardiac size and mediastinal contours. Visualized tracheal air column is within normal limits. Allowing for portable technique the lungs are clear. No pneumothorax. No acute osseous abnormality identified. Negative visible bowel gas. IMPRESSION: No acute cardiopulmonary abnormality. Electronically Signed   By: Odessa Fleming M.D.   On: 07/26/2023 11:33   CT Head Wo Contrast  Result Date: 07/26/2023 CLINICAL DATA:  87 year old male with hypertension, weakness/fatigue, diaphoretic, presyncope. EXAM: CT HEAD WITHOUT CONTRAST TECHNIQUE: Contiguous axial images were obtained from the base of the skull through the vertex without intravenous contrast. RADIATION DOSE REDUCTION: This exam was performed according to the departmental dose-optimization program which includes automated exposure control, adjustment of the mA and/or kV according to patient size and/or use of iterative reconstruction technique. COMPARISON:  None Available. FINDINGS: Brain: No midline shift, ventriculomegaly, mass  effect, evidence of mass lesion, intracranial hemorrhage or evidence of cortically based acute infarction. Small area of cortical and subcortical white matter encephalomalacia in the posterior right MCA territory on series 2, image 23 and coronal image 51, right parietal lobe. But elsewhere largely normal for age gray-white differentiation. Vascular: Calcified atherosclerosis at the skull base. No suspicious intracranial vascular hyperdensity. Skull: No acute osseous abnormality identified. Sinuses/Orbits: Visualized paranasal sinuses and mastoids are well aerated. Other: No acute orbit or scalp soft tissue finding. IMPRESSION: 1. No acute intracranial abnormality. 2. 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 Chronic posterior right MCA territory infarct. Electronically Signed   By: Odessa Fleming M.D.   On: 07/26/2023 11:32   (Echo, Carotid, EGD, Colonoscopy, ERCP)    Subjective: Pt denies any complaints. Pt denies any dizziness    Discharge Exam: Vitals:   07/29/23 0530 07/29/23 0830  BP: 133/68 126/71  Pulse: 77 75  Resp: 16 16  Temp: 97.7 F (36.5 C) 97.8 F (36.6 C)  SpO2: 98% 98%   Vitals:   07/28/23 2057 07/29/23 0123 07/29/23 0530 07/29/23 0830  BP: 130/64 (!) 140/67 133/68 126/71  Pulse: 73 82 77 75  Resp: 18 18 16 16   Temp: 97.7 F (36.5 C) 98 F (36.7 C) 97.7 F (36.5 C) 97.8 F (36.6 C)  TempSrc: Oral Oral Oral Oral  SpO2: 100% 98% 98% 98%  Weight:      Height:        General: Pt is alert, awake, not in acute distress Cardiovascular: S1/S2 +, no rubs, no gallops Respiratory: CTA bilaterally, no wheezing, no rhonchi Abdominal: Soft, NT, ND, bowel sounds + Extremities:  no cyanosis    The results of significant diagnostics from this hospitalization (including imaging, microbiology, ancillary and laboratory) are listed below for reference.     Microbiology: No results found for this or any previous visit (from the past 240 hour(s)).   Labs: BNP  (last 3 results) Recent Labs    07/26/23 0926  BNP 124.1*   Basic Metabolic Panel: Recent Labs  Lab 07/26/23 0926 07/27/23 0417 07/28/23 0501 07/29/23 0512  NA 133* 136 134* 137  K 4.2 4.9 4.4 4.8  CL 109 107 108 107  CO2 17* 19* 20* 18*  GLUCOSE 133* 96 82 82  BUN 37* 30* 36* 43*  CREATININE 2.32* 2.00* 2.31* 2.49*  CALCIUM 8.7* 8.8* 8.2* 8.3*   Liver Function Tests: Recent Labs  Lab 07/26/23 0926 07/27/23 0417  AST 20 22  ALT 17 16  ALKPHOS 53 56  BILITOT 0.6 0.9  PROT 6.8 7.1  ALBUMIN 3.6 3.8   No results for input(s): "LIPASE", "AMYLASE" in the last 168 hours. No results for input(s): "AMMONIA" in the last 168 hours. CBC: Recent Labs  Lab 07/26/23 0926 07/27/23 0417 07/28/23 0501 07/29/23 0512  WBC 7.6 7.3 4.5 5.3  HGB 10.4* 11.0* 8.6* 8.5*  HCT 30.9* 34.1* 25.7* 25.2*  MCV 98.7 101.5* 98.1 96.9  PLT 165 182 148* 148*   Cardiac Enzymes: No results for input(s): "CKTOTAL", "CKMB", "CKMBINDEX", "TROPONINI" in the last 168 hours. BNP: Invalid input(s): "POCBNP" CBG: Recent Labs  Lab 07/27/23 0736  GLUCAP 83   D-Dimer No results for input(s): "DDIMER" in the last 72 hours. Hgb A1c No results for input(s): "HGBA1C" in the last 72 hours. Lipid Profile No results for input(s): "CHOL", "HDL", "LDLCALC", "TRIG", "CHOLHDL", "LDLDIRECT" in the last 72 hours. Thyroid function studies No results for input(s): "TSH", "T4TOTAL", "T3FREE", "THYROIDAB" in the last 72 hours.  Invalid input(s): "FREET3" Anemia work up Recent Labs    07/28/23 0501  FERRITIN 170  TIBC 218*  IRON 48   Urinalysis    Component Value Date/Time   COLORURINE YELLOW 03/04/2021 1241   APPEARANCEUR Clear 01/21/2023 1436   LABSPEC 1.010 03/04/2021 1241   PHURINE 5.0 03/04/2021 1241   GLUCOSEU Negative 01/21/2023 1436   HGBUR TRACE (A) 03/04/2021 1241   BILIRUBINUR Negative 01/21/2023 1436   KETONESUR NEGATIVE 03/04/2021 1241   PROTEINUR 1+ (A) 01/21/2023 1436   PROTEINUR  100 (A) 03/04/2021 1241   NITRITE Negative 01/21/2023 1436   NITRITE NEGATIVE 03/04/2021 1241   LEUKOCYTESUR Trace (A) 01/21/2023 1436   LEUKOCYTESUR LARGE (A) 03/04/2021 1241   Sepsis Labs Recent Labs  Lab 07/26/23 0926 07/27/23 0417 07/28/23 0501 07/29/23 0512  WBC 7.6 7.3 4.5 5.3   Microbiology No results found for this or any previous visit (from the past 240 hour(s)).   Time coordinating discharge: Over 30 minutes  SIGNED:   Charise Killian, MD  [facility] 07/29/2023, 1:34 PM Pager   If 7PM-7AM, please contact night-coverage [email or web address]

## 2023-07-29 NOTE — Progress Notes (Signed)
PHARMACIST - PHYSICIAN COMMUNICATION  CONCERNING: IV to Oral Route Change Policy  RECOMMENDATION: This patient is receiving pantoprazole by the intravenous route.  Based on criteria approved by the Pharmacy and Therapeutics Committee, the intravenous medication(s) is/are being converted to the equivalent oral dose form(s).  DESCRIPTION: These criteria include: The patient is eating (either orally or via tube) and/or has been taking other orally administered medications for a least 24 hours The patient has no evidence of active gastrointestinal bleeding or impaired GI absorption (gastrectomy, short bowel, patient on TNA or NPO).  If you have questions about this conversion, please contact the Pharmacy Department   Tressie Ellis, [facility] 07/29/2023 8:01 AM

## 2023-08-20 ENCOUNTER — Emergency Department: Payer: Medicare HMO

## 2023-08-20 ENCOUNTER — Other Ambulatory Visit: Payer: Self-pay

## 2023-08-20 ENCOUNTER — Observation Stay
Admission: EM | Admit: 2023-08-20 | Discharge: 2023-08-21 | Disposition: A | Discharge status: Home / Self Care | Payer: Medicare HMO | Attending: Internal Medicine | Admitting: Internal Medicine

## 2023-08-20 DIAGNOSIS — N400 Enlarged prostate without lower urinary tract symptoms: Secondary | ICD-10-CM | POA: Diagnosis not present

## 2023-08-20 DIAGNOSIS — N184 Chronic kidney disease, stage 4 (severe): Secondary | ICD-10-CM | POA: Insufficient documentation

## 2023-08-20 DIAGNOSIS — D649 Anemia, unspecified: Secondary | ICD-10-CM | POA: Diagnosis present

## 2023-08-20 DIAGNOSIS — D631 Anemia in chronic kidney disease: Secondary | ICD-10-CM | POA: Diagnosis not present

## 2023-08-20 DIAGNOSIS — I5032 Chronic diastolic (congestive) heart failure: Secondary | ICD-10-CM | POA: Diagnosis present

## 2023-08-20 DIAGNOSIS — I2510 Atherosclerotic heart disease of native coronary artery without angina pectoris: Secondary | ICD-10-CM | POA: Insufficient documentation

## 2023-08-20 DIAGNOSIS — Z951 Presence of aortocoronary bypass graft: Secondary | ICD-10-CM | POA: Diagnosis not present

## 2023-08-20 DIAGNOSIS — I712 Thoracic aortic aneurysm, without rupture, unspecified: Secondary | ICD-10-CM | POA: Diagnosis not present

## 2023-08-20 DIAGNOSIS — I130 Hypertensive heart and chronic kidney disease with heart failure and stage 1 through stage 4 chronic kidney disease, or unspecified chronic kidney disease: Secondary | ICD-10-CM | POA: Insufficient documentation

## 2023-08-20 DIAGNOSIS — Z952 Presence of prosthetic heart valve: Secondary | ICD-10-CM

## 2023-08-20 DIAGNOSIS — R1110 Vomiting, unspecified: Secondary | ICD-10-CM | POA: Diagnosis present

## 2023-08-20 DIAGNOSIS — R112 Nausea with vomiting, unspecified: Secondary | ICD-10-CM | POA: Diagnosis present

## 2023-08-20 DIAGNOSIS — Z1152 Encounter for screening for COVID-19: Secondary | ICD-10-CM | POA: Diagnosis not present

## 2023-08-20 DIAGNOSIS — I10 Essential (primary) hypertension: Secondary | ICD-10-CM | POA: Diagnosis present

## 2023-08-20 DIAGNOSIS — N1832 Chronic kidney disease, stage 3b: Secondary | ICD-10-CM | POA: Diagnosis present

## 2023-08-20 DIAGNOSIS — I480 Paroxysmal atrial fibrillation: Secondary | ICD-10-CM | POA: Diagnosis not present

## 2023-08-20 DIAGNOSIS — I7121 Aneurysm of the ascending aorta, without rupture: Secondary | ICD-10-CM | POA: Diagnosis present

## 2023-08-20 LAB — CBC
HCT: 26.2 % — ABNORMAL LOW (ref 39.0–52.0)
Hemoglobin: 8.7 g/dL — ABNORMAL LOW (ref 13.0–17.0)
MCH: 32.6 pg (ref 26.0–34.0)
MCHC: 33.2 g/dL (ref 30.0–36.0)
MCV: 98.1 fL (ref 80.0–100.0)
Platelets: 127 10*3/uL — ABNORMAL LOW (ref 150–400)
RBC: 2.67 MIL/uL — ABNORMAL LOW (ref 4.22–5.81)
RDW: 13.2 % (ref 11.5–15.5)
WBC: 6.4 10*3/uL (ref 4.0–10.5)
nRBC: 0 % (ref 0.0–0.2)

## 2023-08-20 LAB — COMPREHENSIVE METABOLIC PANEL
ALT: 15 U/L (ref 0–44)
AST: 18 U/L (ref 15–41)
Albumin: 3.6 g/dL (ref 3.5–5.0)
Alkaline Phosphatase: 54 U/L (ref 38–126)
Anion gap: 9 (ref 5–15)
BUN: 35 mg/dL — ABNORMAL HIGH (ref 8–23)
CO2: 19 mmol/L — ABNORMAL LOW (ref 22–32)
Calcium: 8.7 mg/dL — ABNORMAL LOW (ref 8.9–10.3)
Chloride: 102 mmol/L (ref 98–111)
Creatinine, Ser: 1.92 mg/dL — ABNORMAL HIGH (ref 0.61–1.24)
GFR, Estimated: 34 mL/min — ABNORMAL LOW (ref >60)
Glucose, Bld: 119 mg/dL — ABNORMAL HIGH (ref 70–99)
Potassium: 4.1 mmol/L (ref 3.5–5.1)
Sodium: 130 mmol/L — ABNORMAL LOW (ref 135–145)
Total Bilirubin: 1 mg/dL (ref 0.3–1.2)
Total Protein: 6.4 g/dL — ABNORMAL LOW (ref 6.5–8.1)

## 2023-08-20 LAB — TROPONIN I (HIGH SENSITIVITY): Troponin I (High Sensitivity): 18 ng/L — ABNORMAL HIGH (ref <18)

## 2023-08-20 LAB — LIPASE, BLOOD: Lipase: 41 U/L (ref 11–51)

## 2023-08-20 MED ORDER — ONDANSETRON HCL 4 MG/2ML IJ SOLN
4.0000 mg | Freq: Once | INTRAMUSCULAR | Status: AC
  Administered 2023-08-20: 4 mg via INTRAVENOUS
  Filled 2023-08-20: qty 2

## 2023-08-20 MED ORDER — SODIUM CHLORIDE 0.9 % IV SOLN: INTRAVENOUS | Status: AC

## 2023-08-20 NOTE — ED Triage Notes (Signed)
Pt to ed from home via POV for vomiting that started yesterday. Pt is caox4, in no acute distress and ambulatory in triage. Pt has been constipated the last week as well. He did have a BM yesterday and it as "very hard" per pt.

## 2023-08-20 NOTE — ED Provider Notes (Signed)
[facility] Provider Note    Event Date/Time   First MD Initiated Contact with Patient 08/20/23 2306     (approximate)   History   Emesis (X 2 days)   HPI  Rodney Wiley is a 87 y.o. male  with PMHx AVR with mechanical valve, AFib, CKD, HLD, on anticoagulation, here with n/v and inability to tolerate PO. For the past 4 days, pt has had diffuse abdominal cramping, nausea, and vomiting. He has been essentially unable to tolerate any PO intake. He has been increasingly weak. This started after he had some constipation, which he thought was 2/2 taking opiates for his recent L knee injury. He had little to no BM x several days, did have a small hard non bloody BM yesterday. No fevers, chills. No ongoing abdominal pain, just severe nausea and vomiting with any PO attempt.       Physical Exam   Triage Vital Signs: ED Triage Vitals  Encounter Vitals Group     BP 08/20/23 1944 (!) 156/80     Systolic BP Percentile --      Diastolic BP Percentile --      Pulse Rate 08/20/23 1944 69     Resp 08/20/23 1944 16     Temp 08/20/23 1944 98 F (36.7 C)     Temp Source 08/20/23 1944 Oral     SpO2 08/20/23 1944 98 %     Weight --      Height 08/20/23 1936 5\' 7"  (1.702 m)     Head Circumference --      Peak Flow --      Pain Score 08/20/23 1936 0     Pain Loc --      Pain Education --      Exclude from Growth Chart --     Most recent vital signs: Vitals:   08/21/23 0728 08/21/23 0800  BP:  (!) 151/69  Pulse:  68  Resp:    Temp: 98.4 F (36.9 C)   SpO2:  98%     General: Awake, no distress.  CV:  Good peripheral perfusion. RRR. Systolic murmur. Resp:  Normal work of breathing. Lungs clear bilaterally. Abd:  No distention. Hyperactive BS, mild diffuse TTP without overt guarding or rebound. Other:  Mildly dry MM.    ED Results / Procedures / Treatments   Labs (all labs ordered are listed, but only abnormal results are displayed) Labs Reviewed   COMPREHENSIVE METABOLIC PANEL - Abnormal; Notable for the following components:      Result Value   Sodium 130 (*)    CO2 19 (*)    Glucose, Bld 119 (*)    BUN 35 (*)    Creatinine, Ser 1.92 (*)    Calcium 8.7 (*)    Total Protein 6.4 (*)    GFR, Estimated 34 (*)    All other components within normal limits  CBC - Abnormal; Notable for the following components:   RBC 2.67 (*)    Hemoglobin 8.7 (*)    HCT 26.2 (*)    Platelets 127 (*)    All other components within normal limits  URINALYSIS, ROUTINE W REFLEX MICROSCOPIC - Abnormal; Notable for the following components:   Color, Urine YELLOW (*)    APPearance CLEAR (*)    Protein, ur 100 (*)    All other components within normal limits  BASIC METABOLIC PANEL - Abnormal; Notable for the following components:   Sodium 131 (*)  CO2 19 (*)    Glucose, Bld 118 (*)    BUN 36 (*)    Creatinine, Ser 1.97 (*)    Calcium 8.4 (*)    GFR, Estimated 33 (*)    All other components within normal limits  CBC - Abnormal; Notable for the following components:   RBC 2.52 (*)    Hemoglobin 8.2 (*)    HCT 24.3 (*)    Platelets 126 (*)    All other components within normal limits  TROPONIN I (HIGH SENSITIVITY) - Abnormal; Notable for the following components:   Troponin I (High Sensitivity) 18 (*)    All other components within normal limits  SARS CORONAVIRUS 2 BY RT PCR  LIPASE, BLOOD  PROTIME-INR  TYPE AND SCREEN  TROPONIN I (HIGH SENSITIVITY)     EKG Normal sinus rhythm with first-degree AV block.  Ventricular rate 70.  PR 246, QRS 90, QTc 423.  No acute ST elevations or depressions.  K-Tabs of acute ischemia or infarct.   RADIOLOGY DG abdomen: Negative CT abdomen/pelvis:cholelithiasis, no complications [place]: Cholelithiasis without complicating factors   I also independently reviewed and agree with radiologist interpretations.   PROCEDURES:  Critical Care performed: No  .1-3 Lead EKG Interpretation  Performed by:  Shaune Pollack, MD Authorized by: Shaune Pollack, MD     Interpretation: normal     ECG rate:  60-80   ECG rate assessment: normal     Rhythm: sinus rhythm     Ectopy: none     Conduction: normal   Comments:     Indication: Vomiting, abd pain    MEDICATIONS ORDERED IN ED: Medications  0.9 %  sodium chloride infusion (0 mLs Intravenous Stopped 08/21/23 0324)  aspirin EC tablet 81 mg (81 mg Oral Given 08/21/23 0913)  amiodarone (PACERONE) tablet 100 mg (100 mg Oral Given 08/21/23 0911)  amLODipine (NORVASC) tablet 5 mg (5 mg Oral Given 08/21/23 0911)  atorvastatin (LIPITOR) tablet 40 mg (has no administration in time range)  doxazosin (CARDURA) tablet 2 mg (2 mg Oral Given 08/21/23 0324)  enalapril (VASOTEC) tablet 10 mg (10 mg Oral Given 08/21/23 0911)  acetaminophen (TYLENOL) tablet 650 mg (has no administration in time range)    Or  acetaminophen (TYLENOL) suppository 650 mg (has no administration in time range)  ondansetron (ZOFRAN) tablet 4 mg (has no administration in time range)    Or  ondansetron (ZOFRAN) injection 4 mg (has no administration in time range)  lactated ringers infusion ( Intravenous New Bag/Given 08/21/23 0324)  pantoprazole (PROTONIX) injection 40 mg (40 mg Intravenous Given 08/21/23 0324)  promethazine (PHENERGAN) 6.25 mg/NS 50 mL IVPB (has no administration in time range)  ondansetron (ZOFRAN) injection 4 mg (4 mg Intravenous Given 08/20/23 2342)     IMPRESSION / MDM / ASSESSMENT AND PLAN / ED COURSE  I reviewed the triage vital signs and the nursing notes.                              Differential diagnosis includes, but is not limited to, obstruction, ileus, fecal impaction, gastritis, PUD, enteritis, atypical angina, viral GI illness, food-borne illness.  Patient's presentation is most consistent with acute presentation with potential threat to life or bodily function.  The patient is on the cardiac monitor to evaluate for evidence of arrhythmia and/or  significant heart rate changes  87 yo M here with nausea, vomiting, intractable emesis with weakness. Unclear etiology - query  possible ileus or adverse effect from recent opioid use for his knee, or gastritis/GERD esp given mild decrease in his hgb from baseline. Cholelithiasis also a consideration thought LFTs are normal. Given his persistent n/v and inability to tolerate PO, will admit.    FINAL CLINICAL IMPRESSION(S) / ED DIAGNOSES   Final diagnoses:  Nausea and vomiting, unspecified vomiting type     Rx / DC Orders   ED Discharge Orders     None        Note:  This document was prepared using Dragon voice recognition software and may include unintentional dictation errors.   Shaune Pollack, MD 08/21/23 [phone]

## 2023-08-21 ENCOUNTER — Emergency Department: Payer: Medicare HMO

## 2023-08-21 DIAGNOSIS — R1110 Vomiting, unspecified: Secondary | ICD-10-CM | POA: Diagnosis not present

## 2023-08-21 LAB — SARS CORONAVIRUS 2 BY RT PCR: SARS Coronavirus 2 by RT PCR: NEGATIVE

## 2023-08-21 LAB — URINALYSIS, ROUTINE W REFLEX MICROSCOPIC
Bacteria, UA: NONE SEEN
Bilirubin Urine: NEGATIVE
Glucose, UA: NEGATIVE mg/dL
Hgb urine dipstick: NEGATIVE
Ketones, ur: NEGATIVE mg/dL
Leukocytes,Ua: NEGATIVE
Nitrite: NEGATIVE
Protein, ur: 100 mg/dL — AB
Specific Gravity, Urine: 1.015 (ref 1.005–1.030)
pH: 5 (ref 5.0–8.0)

## 2023-08-21 LAB — TROPONIN I (HIGH SENSITIVITY): Troponin I (High Sensitivity): 17 ng/L (ref <18)

## 2023-08-21 LAB — BASIC METABOLIC PANEL
Anion gap: 9 (ref 5–15)
BUN: 36 mg/dL — ABNORMAL HIGH (ref 8–23)
CO2: 19 mmol/L — ABNORMAL LOW (ref 22–32)
Calcium: 8.4 mg/dL — ABNORMAL LOW (ref 8.9–10.3)
Chloride: 103 mmol/L (ref 98–111)
Creatinine, Ser: 1.97 mg/dL — ABNORMAL HIGH (ref 0.61–1.24)
GFR, Estimated: 33 mL/min — ABNORMAL LOW (ref >60)
Glucose, Bld: 118 mg/dL — ABNORMAL HIGH (ref 70–99)
Potassium: 4.4 mmol/L (ref 3.5–5.1)
Sodium: 131 mmol/L — ABNORMAL LOW (ref 135–145)

## 2023-08-21 LAB — CBC
HCT: 24.3 % — ABNORMAL LOW (ref 39.0–52.0)
Hemoglobin: 8.2 g/dL — ABNORMAL LOW (ref 13.0–17.0)
MCH: 32.5 pg (ref 26.0–34.0)
MCHC: 33.7 g/dL (ref 30.0–36.0)
MCV: 96.4 fL (ref 80.0–100.0)
Platelets: 126 10*3/uL — ABNORMAL LOW (ref 150–400)
RBC: 2.52 MIL/uL — ABNORMAL LOW (ref 4.22–5.81)
RDW: 13 % (ref 11.5–15.5)
WBC: 6.3 10*3/uL (ref 4.0–10.5)
nRBC: 0 % (ref 0.0–0.2)

## 2023-08-21 LAB — PROTIME-INR
INR: 1.2 (ref 0.8–1.2)
Prothrombin Time: 15.2 s (ref 11.4–15.2)

## 2023-08-21 LAB — TYPE AND SCREEN
ABO/RH(D): O NEG
Antibody Screen: NEGATIVE

## 2023-08-21 MED ORDER — PANTOPRAZOLE SODIUM 40 MG PO TBEC: 40.0000 mg | DELAYED_RELEASE_TABLET | Freq: Every day | ORAL | 1 refills | Status: DC

## 2023-08-21 MED ORDER — ENALAPRIL MALEATE 10 MG PO TABS
10.0000 mg | ORAL_TABLET | Freq: Every day | ORAL | Status: DC
  Administered 2023-08-21: 10 mg via ORAL
  Filled 2023-08-21: qty 1

## 2023-08-21 MED ORDER — PROMETHAZINE (PHENERGAN) 6.25MG IN NS 50ML IVPB: 6.2500 mg | Freq: Four times a day (QID) | INTRAVENOUS | Status: DC | PRN

## 2023-08-21 MED ORDER — LACTATED RINGERS IV SOLN: INTRAVENOUS | Status: AC

## 2023-08-21 MED ORDER — AMIODARONE HCL 200 MG PO TABS
100.0000 mg | ORAL_TABLET | Freq: Every day | ORAL | Status: DC
  Administered 2023-08-21: 100 mg via ORAL
  Filled 2023-08-21: qty 1

## 2023-08-21 MED ORDER — DOXAZOSIN MESYLATE 2 MG PO TABS
2.0000 mg | ORAL_TABLET | Freq: Two times a day (BID) | ORAL | Status: DC
  Administered 2023-08-21 (×2): 2 mg via ORAL
  Filled 2023-08-21 (×2): qty 1

## 2023-08-21 MED ORDER — ATORVASTATIN CALCIUM 20 MG PO TABS
40.0000 mg | ORAL_TABLET | Freq: Every day | ORAL | Status: DC
  Administered 2023-08-21: 40 mg via ORAL
  Filled 2023-08-21: qty 2

## 2023-08-21 MED ORDER — ASPIRIN 81 MG PO TBEC
81.0000 mg | DELAYED_RELEASE_TABLET | Freq: Every day | ORAL | Status: DC
  Administered 2023-08-21: 81 mg via ORAL
  Filled 2023-08-21: qty 1

## 2023-08-21 MED ORDER — PANTOPRAZOLE SODIUM 40 MG IV SOLR
40.0000 mg | INTRAVENOUS | Status: DC
  Administered 2023-08-21: 40 mg via INTRAVENOUS
  Filled 2023-08-21: qty 10

## 2023-08-21 MED ORDER — ONDANSETRON HCL 4 MG/2ML IJ SOLN: 4.0000 mg | Freq: Four times a day (QID) | INTRAMUSCULAR | Status: DC | PRN

## 2023-08-21 MED ORDER — ACETAMINOPHEN 325 MG PO TABS: 650.0000 mg | ORAL_TABLET | Freq: Four times a day (QID) | ORAL | Status: DC | PRN

## 2023-08-21 MED ORDER — ONDANSETRON HCL 4 MG PO TABS: 4.0000 mg | ORAL_TABLET | Freq: Four times a day (QID) | ORAL | Status: DC | PRN

## 2023-08-21 MED ORDER — ONDANSETRON HCL 4 MG PO TABS: 4.0000 mg | ORAL_TABLET | Freq: Four times a day (QID) | ORAL | 0 refills | Status: DC | PRN

## 2023-08-21 MED ORDER — AMLODIPINE BESYLATE 5 MG PO TABS
5.0000 mg | ORAL_TABLET | Freq: Every day | ORAL | Status: DC
  Administered 2023-08-21: 5 mg via ORAL
  Filled 2023-08-21: qty 1

## 2023-08-21 MED ORDER — ACETAMINOPHEN 650 MG RE SUPP: 650.0000 mg | Freq: Four times a day (QID) | RECTAL | Status: DC | PRN

## 2023-08-21 NOTE — Assessment & Plan Note (Signed)
Clinically euvolemic Continue home GDMT Monitor for overload with ongoing hydration Daily weights

## 2023-08-21 NOTE — Assessment & Plan Note (Signed)
BP controlled - Continue home antihypertensives

## 2023-08-21 NOTE — Assessment & Plan Note (Addendum)
Uncertain etiology.  Similar hospitalization with intractable nausea a month prior CT abdomen and pelvis and right upper quadrant ultrasound showing cholelithiasis and diverticulosis without complicating factors SMA ultrasound in August 2024 showed no hemodynamically significant SMA stenosis Supportive care with IV fluids, IV antiemetics Will keep n.p.o. tonight IV Protonix Given recurrent nature can will consult GI for consideration of endoscopy

## 2023-08-21 NOTE — Assessment & Plan Note (Signed)
No acute issues suspected

## 2023-08-21 NOTE — Consult Note (Signed)
GI Inpatient Consult Note  Reason for Consult: Intractable nausea and vomiting    Attending Requesting Consult: Dr. Lindajo Royal  History of Present Illness: Rodney Wiley is a 87 y.o. male seen for evaluation of intractable nausea and vomiting and poor PO intake at the request of admitting hospitalist - Dr. Lindajo Royal. Patient has a PMH of HTN, HLD, CAD s/p CABG x3 (02/2018) s/p AVR 02/2018, s/p ascending aortic aneurysm repair, HFpEF, PAF, hx of SVT, hx of SMA stenosis, hx of renal artery stenosis, CKD Stage IV, and AOCD. He presented to the [facility] ED yesterday evening for chief complaint of 3-day history of postprandial nausea and vomiting. Upon presentation to the ED, he was hypertensive (156/80) and otherwise normal vital signs. Labs significant for hemoglobin 8.7 (was 8.5 three weeks ago), sodium 130, BUN 35, serum creatinine 1.92, normal LFTs, hs-troponin 17, and INR 1.2. UA was negative. Non-contrasted CT abd/pelvis commented on cholelithiasis without evidence of cholecystitis, colonic diverticulosis, and no acute findings. Follow-up RUQ [place] confirmed cholelithiasis without features of cholecystitis. He was treated with IV fluid hydration and IV Zofran. Initially, symptoms improved but then he had another episode of vomiting after an oral challenge. GI consulted for further evaluation and management.   Patient seen and examined this morning resting comfortable in ED bed. No acute events overnight. He has remained NPO overnight. He reports his symptoms started on Wednesday with postprandial nausea lasting for 5-10 minutes anytime after he would try to eat. He reports on several occasions the food would come back up as well. He doesn't feel like his stomach would settle at all. He denies any issues with esophageal dysphagia, odynophagia, epigastric abdominal pain, or hoarseness. He takes omeprazole 20 mg daily for GERD at home and these symptoms have been fairly well-controlled. There is no  postprandial abdominal pain. He was admitted for 3-days last month for nausea, malaise, diaphoresis, and reduced PO intake. He had mesenteric ultrasound performed during admission which showed no hemodynamically significant stenosis. No recent antibiotic use. He does note he saw orthopaedic surgery on 9/10 earlier this week for issues with insufficiency fracture of left tibia and he was started on immediate-release oxycodone 5 mg PRN for pain. He has been taking the oxycodone. He has been dealing with significant constipation since starting the narcotics. He had a small, hard BM yesterday before coming to the hospital. He denies any hematochezia or melena. He lives alone.    Past Medical History:  Past Medical History:  Diagnosis Date   Anemia    Aortic insufficiency    a. 02/2018 s/p bioprosthetic AVR; 04/2018 Echo: EF 50-55%, Gr2 DD, Ao bioprosthesis, mean grad , Ao root/Asc Ao nl in size, mild MR, mildly dil LA, nl RV fxn. Nl PASP.   Arthropathy    Ascending aortic aneurysm (HCC)    a. 12/2016 MRA: 4.3cm @ sinus of valsalva, 4.9cm above sinotubular jxn; b. 02/2018 s/p biological Bentall and AVR.   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 02/2018 Cath: LAD 20ost, 40p, 32m, LCX 60p, OM2 60, OM3 85, RCA 10p/m w/ L->R and R->R collats;  b. 02/2018 CABG x 3: LIMA to LAD, SVG to OM2, SVG to PDA, EVH via right thigh.   CKD (chronic kidney disease), stage III (HCC)    Diverticulitis    Essential hypertension    GERD (gastroesophageal reflux disease)    Hemorrhoids    History of chicken pox  History of Helicobacter pylori infection 06/2007   Hyperlipidemia    Hypertension    Left renal artery stenosis (HCC)    a. 01/2017 RA u/s: moderate L RAS.   Lower extremity edema    Nonrheumatic mitral (valve) insufficiency    a. 12/2016 Echo: mild to mod MR; b. 09/2017 TEE: mild to mod MR; c. 04/2018 Echo: Mild MR.   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 s/p 21 mm Edwards Inspiris Resilia stented bovine pericardial tissue valve and 24 mm Gelweave Valsalva synthetic root conduit with reimplantation of left main coronary artery.    Problem List: Patient Active Problem List   Diagnosis Date Noted   Intractable vomiting 08/21/2023   Lightheadedness 07/26/2023   Nausea 07/26/2023   Pre-syncope 07/26/2023   Leg edema 11/24/2022   Abdominal bruit 11/24/2022   Aneurysm of ascending aorta without rupture (HCC) 05/22/2022   Iron deficiency anemia 07/13/2021   Sinus bradycardia 06/04/2021   Chronic kidney disease, stage IV (severe) (HCC) 11/13/2020   Elevated PSA 09/15/2020   Benign prostatic hyperplasia without lower urinary tract symptoms 09/15/2020   Anemia 05/20/2020   PSVT (paroxysmal supraventricular tachycardia) 12/11/2018   Chronic heart failure with preserved ejection fraction (HFpEF) (HCC) 12/11/2018   Paroxysmal atrial fibrillation (HCC) 05/17/2018   Chronic diastolic heart failure (HCC) 05/17/2018   Medication management 05/17/2018   S/P bioprosthetic  AVR 02/22/2018   CAD S/P CABG x 3 2019 02/22/2018   S/P ascending aortic aneurysm repair 02/22/2018   Coronary artery disease involving native coronary artery of native heart with angina pectoris (HCC)    First degree AV block 12/21/2017   Stage 3b chronic kidney disease (HCC) 03/22/2017   Thoracic aortic aneurysm without rupture (HCC) 01/27/2017   Renal artery stenosis (HCC) 01/27/2017   Hyperlipidemia LDL goal <70 01/27/2017   Nonrheumatic aortic valve insufficiency 10/27/2016   Mitral regurgitation 10/27/2016   Essential hypertension 02/08/2007   HYPERTENSION, BENIGN SYSTEMIC 02/02/2007    Past Surgical History: Past Surgical History:  Procedure Laterality Date   AORTIC VALVE REPAIR N/A 02/22/2018   Procedure: AORTIC VALVE REPLACEMENT -Biological Bentall aortic  root replacement,;  Surgeon: Purcell Nails, MD;  Location: [facility] OR;  Service: Open Heart Surgery;  Laterality: N/A;   bypass surgery     CARDIAC CATHETERIZATION     01/16/2018   COLONOSCOPY  2011   by Dr. Bluford Kaufmann with findings of diverticulosis   CORONARY ARTERY BYPASS GRAFT N/A 02/22/2018   Procedure: CORONARY ARTERY BYPASS GRAFTING (CABG) x three, using left internal mammary artery and right leg greater saphenous vein harvested endoscopically;  Surgeon: Purcell Nails, MD;  Location: [facility] OR;  Service: Open Heart Surgery;  Laterality: N/A;   CORONARY/GRAFT ANGIOGRAPHY N/A 01/16/2018   Procedure: CORONARY/GRAFT ANGIOGRAPHY;  Surgeon: Yvonne Kendall, MD;  Location: [facility] INVASIVE CV LAB;  Service: Cardiovascular;  Laterality: N/A;   ELBOW SURGERY Left    hemorhoidectomy     RIGHT HEART CATH N/A 01/16/2018   Procedure: RIGHT HEART CATH;  Surgeon: Yvonne Kendall, MD;  Location: [facility] INVASIVE CV LAB;  Service: Cardiovascular;  Laterality: N/A;   SHOULDER ARTHROSCOPY WITH ROTATOR CUFF REPAIR Right    SHOULDER SURGERY Right    TEE WITHOUT CARDIOVERSION N/A 09/27/2017   Procedure: TRANSESOPHAGEAL ECHOCARDIOGRAM (TEE);  Surgeon: Lars Masson, MD;  Location: [facility] ENDOSCOPY;  Service: Cardiovascular;  Laterality: N/A;   TEE WITHOUT CARDIOVERSION N/A  02/22/2018   Procedure: TRANSESOPHAGEAL ECHOCARDIOGRAM (TEE);  Surgeon: Purcell Nails, MD;  Location: [facility] OR;  Service: Open Heart Surgery;  Laterality: N/A;   THORACIC AORTIC ANEURYSM REPAIR N/A 02/22/2018   Procedure: THORACIC ASCENDING ANEURYSM REPAIR (AAA) Resection of ascending aorta aneurysm;  Surgeon: Purcell Nails, MD;  Location: [facility] OR;  Service: Open Heart Surgery;  Laterality: N/A;    Allergies: No Known Allergies  Home Medications: (Not in a hospital admission)  Home medication reconciliation was completed with the patient.   Scheduled Inpatient Medications:    amiodarone  100 mg Oral Daily   amLODipine  5 mg Oral Daily   aspirin EC  81  mg Oral Daily   atorvastatin  40 mg Oral Daily   doxazosin  2 mg Oral BID   enalapril  10 mg Oral Daily   pantoprazole (PROTONIX) IV  40 mg Intravenous Q24H    Continuous Inpatient Infusions:    sodium chloride Stopped (08/21/23 0324)   lactated ringers 100 mL/hr at 08/21/23 0324   promethazine (PHENERGAN) injection (IM or IVPB)      PRN Inpatient Medications:  acetaminophen **OR** acetaminophen, ondansetron **OR** ondansetron (ZOFRAN) IV, promethazine (PHENERGAN) injection (IM or IVPB)  Family History: family history includes Colon cancer in his brother; Heart Problems (age of onset: 62) in his son and son; Heart attack in his paternal grandmother; Hypertension in his mother; Kidney failure in his brother; Leukemia in his father; Prostate cancer in his brother; Stroke in his mother, paternal grandfather, and sister; Uterine cancer in his sister.  The patient's family history is negative for inflammatory bowel disorders, GI malignancy, or solid organ transplantation.  Social History:   reports that he has never smoked. He has never used smokeless tobacco. He reports that he does not drink alcohol and does not use drugs. The patient denies ETOH, tobacco, or drug use.   Review of Systems: Constitutional: + weight loss Eyes: No changes in vision. ENT: No oral lesions, sore throat.  GI: see HPI.  Heme/Lymph: No easy bruising.  CV: No chest pain.  GU: No hematuria.  Integumentary: No rashes.  Neuro: No headaches.  Psych: No depression/anxiety.  Endocrine: No heat/cold intolerance.  Allergic/Immunologic: No urticaria.  Resp: No cough, SOB.  Musculoskeletal: No joint swelling.    Physical Examination: BP (!) 141/76   Pulse (!) 58   Temp 98.4 F (36.9 C) (Oral)   Resp 16   Ht 5\' 7"  (1.702 m)   SpO2 96%   BMI 25.17 kg/m  Gen: NAD, alert and oriented x 4 HEENT: PEERLA, EOMI, Neck: supple, no JVD or thyromegaly Chest: CTA bilaterally, no wheezes, crackles, or other  adventitious sounds CV: RRR, no m/g/c/r Abd: soft, NT, ND, +BS in all four quadrants; no HSM, guarding, ridigity, or rebound tenderness Ext: no edema, well perfused with 2+ pulses, Skin: no rash or lesions noted Lymph: no LAD  Data: Lab Results  Component Value Date   WBC 6.3 08/21/2023   HGB 8.2 (L) 08/21/2023   HCT 24.3 (L) 08/21/2023   MCV 96.4 08/21/2023   PLT 126 (L) 08/21/2023   Recent Labs  Lab 08/20/23 1945 08/21/23 0248  HGB 8.7* 8.2*   Lab Results  Component Value Date   NA 131 (L) 08/21/2023   K 4.4 08/21/2023   CL 103 08/21/2023   CO2 19 (L) 08/21/2023   BUN 36 (H) 08/21/2023   CREATININE 1.97 (H) 08/21/2023   Lab Results  Component Value Date   ALT  15 08/20/2023   AST 18 08/20/2023   ALKPHOS 54 08/20/2023   BILITOT 1.0 08/20/2023   Recent Labs  Lab 08/20/23 2330  INR 1.2   RUQ [place] 08/21/2023: IMPRESSION: Cholelithiasis without complicating factors.   No other focal abnormality is noted.  CT abd/pelvis without contrast 08/21/2023: IMPRESSION: Cholelithiasis without complicating factors.   Diverticulosis without diverticulitis.   No other focal abnormality is noted.  [place] [facility] 07/27/2023: IMPRESSION: 1. No evidence of hemodynamically significant visceral artery stenosis. 2. Proximal abdominal aortic aneurysm measuring 3.0 cm. Recommend follow-up every 3 years. This recommendation follows ACR consensus guidelines: White Paper of the ACR Incidental Findings Committee II on Vascular Findings. J Am Coll Radiol 2013; 10:789-794.  Assessment/Plan:  87 y/o Caucasian male with a PMH of HTN, HLD, CAD s/p CABG x3 (02/2018) s/p AVR 02/2018, s/p ascending aortic aneurysm repair, HFpEF, PAF, hx of SVT, hx of SMA stenosis, hx of renal artery stenosis, CKD Stage IV, and AOCD presented to the [facility] ED yesterday evening for chief complaint of intractable nausea and vomiting   Intractable nausea and vomiting - improved overnight, clinical presentation is  c/w side effects from opioid therapy given to him by ortho on 9/10 earlier this week. Differential also includes peptic etiologies such as gastritis, PUD, esophagitis, duodenitis, biliary dyskinesia, gastroparesis, gastroenteritis, etc  Normocytic anemia - H&H stable, no signs of overt GI bleeding. Suspect component of anemia of chronic disease 2/2 CKD Stage IV  CKD Stage IV  Hyponatremia (131)  CAD s/p CABG x3, s/p AVR, HFpEF  Recommendations:  - Continue supportive care with IV antiemetics and IV fluid hydration - Correct serum electrolytes per primary team  - Discussed potential EGD for endoluminal evaluation to r/o structural abnormalities. Patient defers at this time and prefers to manage symptoms conservatively and avoid procedures requiring sedation.  - Avoid opioids if possible. Suspect he has had GI side effects to narcotics.  - Consider non-narcotic modalities to manage orthopaedic related pain - Continue PPI daily for gastric protection  - Defer EGD for now - Start heart healthy diet and monitor symptoms - GI will sign off at this time and follow peripherally   Thank you for the consult. Please call with questions or concerns.  Gilda Crease, PA-C [facility] [phone]

## 2023-08-21 NOTE — Assessment & Plan Note (Signed)
During hospitalization a month prior patient had a drop in hemoglobin from 11-8.6 but currently stable at 8.7 GI consulted for consideration of endoscopy Keep n.p.o.

## 2023-08-21 NOTE — Assessment & Plan Note (Signed)
-  Continue aspirin and amiodarone

## 2023-08-21 NOTE — H&P (Signed)
History and Physical    Patient: Rodney Wiley ZOX:[id number] DOB: September 08, 1936 DOA: 08/20/2023 DOS: the patient was seen and examined on 08/21/2023 PCP: Armando Gang, FNP  Patient coming from: Home  Chief Complaint:  Chief Complaint  Patient presents with   Emesis    X 2 days    HPI: Rodney Wiley is a 87 y.o. male with medical history significant for hypertension, CAD post CABG (3/19) aortic regurgitation and thoracic aortic aneurysm status post Bentall with bioprosthetic aortic valve replacement (3/19), SVT and frequent PACs, HFpEF, and chronic kidney disease stage 4 , SMA stenosis, renal artery stenosis, GERD , hospitalized fairly recently from 8/20 to 8/23 with intractable nausea ruled out for hemodynamically significant SMA stenosis with mesenteric ultrasound who presents to the ED with intractable vomiting that started the day prior.  He denies abdominal pain.  He has associated constipation that appears to have started since being started on opiates for meniscal injury.  He denies chest pain or shortness of breath.  He has had no fever or chills.  No affected contacts.  Emesis is nonbloody and nonbilious.  Stools without blood and not black. ED course and data review: Vitals within normal limits. Labs: WBC normal.  Hemoglobin stable for the past 3 weeks around 8.7 though prior to that it was at 11.  Lipase and LFTs normal, urinalysis unremarkable.  Troponin 17, COVID pending. EKG, personally reviewed and interpreted shows sinus rhythm at 70 with nonspecific ST-T wave changes. CT abdomen and pelvis showed cholelithiasis and diverticulosis with no complicating factors Right upper quadrant ultrasound similarly shows cholelithiasis without complicating factors. Patient was treated with IV fluids Zofran and initially improved but then started vomiting following an oral challenge. Hospitalist consulted for admission.   Review of Systems: As mentioned in the history of present illness.  All other systems reviewed and are negative.  Past Medical History:  Diagnosis Date   Anemia    Aortic insufficiency    a. 02/2018 s/p bioprosthetic AVR; 04/2018 Echo: EF 50-55%, Gr2 DD, Ao bioprosthesis, mean grad , Ao root/Asc Ao nl in size, mild MR, mildly dil LA, nl RV fxn. Nl PASP.   Arthropathy    Ascending aortic aneurysm (HCC)    a. 12/2016 MRA: 4.3cm @ sinus of valsalva, 4.9cm above sinotubular jxn; b. 02/2018 s/p biological Bentall and AVR.   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 02/2018 Cath: LAD 20ost, 40p, 75m, LCX 60p, OM2 60, OM3 85, RCA 10p/m w/ L->R and R->R collats;  b. 02/2018 CABG x 3: LIMA to LAD, SVG to OM2, SVG to PDA, EVH via right thigh.   CKD (chronic kidney disease), stage III (HCC)    Diverticulitis    Essential hypertension    GERD (gastroesophageal reflux disease)    Hemorrhoids    History of chicken pox    History of Helicobacter pylori infection 06/2007   Hyperlipidemia    Hypertension    Left renal artery stenosis (HCC)    a. 01/2017 RA u/s: moderate L RAS.   Lower extremity edema    Nonrheumatic mitral (valve) insufficiency    a. 12/2016 Echo: mild to mod MR; b. 09/2017 TEE: mild to mod MR; c. 04/2018 Echo: Mild MR.   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 s/p 21 mm Edwards Inspiris Resilia stented bovine pericardial tissue valve  and 24 mm Gelweave Valsalva synthetic root conduit with reimplantation of left main coronary artery.   Past Surgical History:  Procedure Laterality Date   AORTIC VALVE REPAIR N/A 02/22/2018   Procedure: AORTIC VALVE REPLACEMENT -Biological Bentall aortic root replacement,;  Surgeon: Purcell Nails, MD;  Location: [facility] OR;  Service: Open Heart Surgery;  Laterality: N/A;   bypass surgery     CARDIAC CATHETERIZATION     01/16/2018   COLONOSCOPY  2011   by Dr. Bluford Kaufmann with  findings of diverticulosis   CORONARY ARTERY BYPASS GRAFT N/A 02/22/2018   Procedure: CORONARY ARTERY BYPASS GRAFTING (CABG) x three, using left internal mammary artery and right leg greater saphenous vein harvested endoscopically;  Surgeon: Purcell Nails, MD;  Location: [facility] OR;  Service: Open Heart Surgery;  Laterality: N/A;   CORONARY/GRAFT ANGIOGRAPHY N/A 01/16/2018   Procedure: CORONARY/GRAFT ANGIOGRAPHY;  Surgeon: Yvonne Kendall, MD;  Location: [facility] INVASIVE CV LAB;  Service: Cardiovascular;  Laterality: N/A;   ELBOW SURGERY Left    hemorhoidectomy     RIGHT HEART CATH N/A 01/16/2018   Procedure: RIGHT HEART CATH;  Surgeon: Yvonne Kendall, MD;  Location: [facility] INVASIVE CV LAB;  Service: Cardiovascular;  Laterality: N/A;   SHOULDER ARTHROSCOPY WITH ROTATOR CUFF REPAIR Right    SHOULDER SURGERY Right    TEE WITHOUT CARDIOVERSION N/A 09/27/2017   Procedure: TRANSESOPHAGEAL ECHOCARDIOGRAM (TEE);  Surgeon: Lars Masson, MD;  Location: [facility] ENDOSCOPY;  Service: Cardiovascular;  Laterality: N/A;   TEE WITHOUT CARDIOVERSION N/A 02/22/2018   Procedure: TRANSESOPHAGEAL ECHOCARDIOGRAM (TEE);  Surgeon: Purcell Nails, MD;  Location: [facility] OR;  Service: Open Heart Surgery;  Laterality: N/A;   THORACIC AORTIC ANEURYSM REPAIR N/A 02/22/2018   Procedure: THORACIC ASCENDING ANEURYSM REPAIR (AAA) Resection of ascending aorta aneurysm;  Surgeon: Purcell Nails, MD;  Location: [facility] OR;  Service: Open Heart Surgery;  Laterality: N/A;   Social History:  reports that he has never smoked. He has never used smokeless tobacco. He reports that he does not drink alcohol and does not use drugs.  No Known Allergies  Family History  Problem Relation Age of Onset   Hypertension Mother    Stroke Mother    Leukemia Father    Heart attack Paternal Grandmother    Stroke Paternal Grandfather    Heart Problems Son 38   Heart Problems Son 13   Uterine cancer Sister    Prostate cancer Brother    Colon cancer Brother     Kidney failure Brother    Stroke Sister     Prior to Admission medications   Medication Sig Start Date End Date Taking? Authorizing Provider  amiodarone (PACERONE) 100 MG tablet TAKE 1 TABLET BY MOUTH EVERY DAY 07/11/23   End, Cristal Deer, MD  amLODipine (NORVASC) 10 MG tablet Take 5 mg by mouth daily.    [provider]  aspirin EC 81 MG tablet Take 81 mg by mouth daily.    [provider]  atorvastatin (LIPITOR) 40 MG tablet TAKE 1 TABLET BY MOUTH EVERY DAY 07/11/23   End, Cristal Deer, MD  cholecalciferol (VITAMIN D3) 25 MCG (1000 UT) tablet Take 1,000 Units by mouth daily. Patient not taking: Reported on 07/26/2023    [provider]  doxazosin (CARDURA) 2 MG tablet Take 1 tablet (2 mg total) by mouth 2 (two) times daily. 07/29/23 08/28/23  Charise Killian, MD  enalapril (VASOTEC) 10 MG tablet Take 10 mg by mouth daily. 05/18/22   [provider]  Marcelline Deist  10 MG TABS tablet Take 1 tablet (10 mg total) by mouth daily. Patient not taking: Reported on 07/26/2023 05/20/21   End, Cristal Deer, MD  gabapentin (NEURONTIN) 100 MG capsule Take 100 mg by mouth at bedtime. 06/28/23   [provider]  hydrALAZINE (APRESOLINE) 100 MG tablet TAKE 1 TABLET BY MOUTH THREE TIMES A DAY 07/11/23   End, Cristal Deer, MD  isosorbide mononitrate (IMDUR) 60 MG 24 hr tablet TAKE 1 TABLET BY MOUTH EVERY DAY 06/13/23   End, Cristal Deer, MD  levothyroxine (SYNTHROID) 25 MCG tablet Take 25 mcg by mouth every morning. Patient not taking: Reported on 07/26/2023 06/25/21   [provider]  nitroGLYCERIN (NITROSTAT) 0.4 MG SL tablet Place 1 tablet (0.4 mg total) under the tongue every 5 (five) minutes as needed for chest pain. Maximum of 3 doses. 11/12/20   End, Cristal Deer, MD  torsemide (DEMADEX) 10 MG tablet TAKE 1 TABLET BY MOUTH EVERY DAY AS NEEDED 07/11/23   End, Cristal Deer, MD  traMADol (ULTRAM) 50 MG tablet Take 50 mg by mouth every 6 (six) hours as needed. 07/15/23   [provider]    Physical Exam: Vitals:   08/20/23 1936 08/20/23 1944 08/20/23 2330 08/20/23 2341  BP:  (!) 156/80  (!) 169/93  Pulse:  69  67  Resp:  16  19  Temp:  98 F (36.7 C) 98.6 F (37 C)   TempSrc:  Oral Oral   SpO2:  98%  100%  Height: 5\' 7"  (1.702 m)      Physical Exam Vitals and nursing note reviewed.  Constitutional:      General: He is not in acute distress. HENT:     Head: Normocephalic and atraumatic.  Cardiovascular:     Rate and Rhythm: Normal rate and regular rhythm.     Heart sounds: Normal heart sounds.  Pulmonary:     Effort: Pulmonary effort is normal.     Breath sounds: Normal breath sounds.  Abdominal:     Palpations: Abdomen is soft.     Tenderness: There is no abdominal tenderness.  Neurological:     Mental Status: Mental status is at baseline.     Labs on Admission: I have personally reviewed following labs and imaging studies  CBC: Recent Labs  Lab 08/20/23 1945  WBC 6.4  HGB 8.7*  HCT 26.2*  MCV 98.1  PLT 127*   Basic Metabolic Panel: Recent Labs  Lab 08/20/23 1945  NA 130*  K 4.1  CL 102  CO2 19*  GLUCOSE 119*  BUN 35*  CREATININE 1.92*  CALCIUM 8.7*   GFR: CrCl cannot be calculated (Unknown ideal weight.). Liver Function Tests: Recent Labs  Lab 08/20/23 1945  AST 18  ALT 15  ALKPHOS 54  BILITOT 1.0  PROT 6.4*  ALBUMIN 3.6   Recent Labs  Lab 08/20/23 1945  LIPASE 41   No results for input(s): "AMMONIA" in the last 168 hours. Coagulation Profile: Recent Labs  Lab 08/20/23 2330  INR 1.2   Cardiac Enzymes: No results for input(s): "CKTOTAL", "CKMB", "CKMBINDEX", "TROPONINI" in the last 168 hours. BNP (last 3 results) No results for input(s): "PROBNP" in the last 8760 hours. HbA1C: No results for input(s): "HGBA1C" in the last 72 hours. CBG: No results for input(s): "GLUCAP" in the last 168 hours. Lipid Profile: No results for input(s): "CHOL", "HDL", "LDLCALC", "TRIG", "CHOLHDL", "LDLDIRECT" in  the last 72 hours. Thyroid Function Tests: No results for input(s): "TSH", "T4TOTAL", "FREET4", "T3FREE", "THYROIDAB" in the last  72 hours. Anemia Panel: No results for input(s): "VITAMINB12", "FOLATE", "FERRITIN", "TIBC", "IRON", "RETICCTPCT" in the last 72 hours. Urine analysis:    Component Value Date/Time   COLORURINE YELLOW (A) 08/20/2023 1945   APPEARANCEUR CLEAR (A) 08/20/2023 1945   APPEARANCEUR Clear 01/21/2023 1436   LABSPEC 1.015 08/20/2023 1945   PHURINE 5.0 08/20/2023 1945   GLUCOSEU NEGATIVE 08/20/2023 1945   HGBUR NEGATIVE 08/20/2023 1945   BILIRUBINUR NEGATIVE 08/20/2023 1945   BILIRUBINUR Negative 01/21/2023 1436   KETONESUR NEGATIVE 08/20/2023 1945   PROTEINUR 100 (A) 08/20/2023 1945   NITRITE NEGATIVE 08/20/2023 1945   LEUKOCYTESUR NEGATIVE 08/20/2023 1945    Radiological Exams on Admission: US Abdomen Limited RUQ (LIVER/GB)  Result Date: 08/21/2023 CLINICAL DATA:  Right upper quadrant pain EXAM: ULTRASOUND ABDOMEN LIMITED RIGHT UPPER QUADRANT COMPARISON:  CT from earlier in the same day. FINDINGS: Gallbladder: Multiple gallstones are noted within the gallbladder. No wall thickening or pericholecystic fluid is noted. Negative sonographic Murphy's sign is elicited. Common bile duct: Diameter: 3.2 mm. Liver: No focal lesion identified. Within normal limits in parenchymal echogenicity. Portal vein is patent on color Doppler imaging with normal direction of blood flow towards the liver. Other: None. IMPRESSION: Cholelithiasis without complicating factors. No other focal abnormality is noted. Electronically Signed   By: Alcide Clever M.D.   On: 08/21/2023 01:16   CT ABDOMEN PELVIS WO CONTRAST  Result Date: 08/21/2023 CLINICAL DATA:  Acute abdominal pain and vomiting for 2 days, initial encounter EXAM: CT ABDOMEN AND PELVIS WITHOUT CONTRAST TECHNIQUE: Multidetector CT imaging of the abdomen and pelvis was performed following the standard protocol without IV contrast.  RADIATION DOSE REDUCTION: This exam was performed according to the departmental dose-optimization program which includes automated exposure control, adjustment of the mA and/or kV according to patient size and/or use of iterative reconstruction technique. COMPARISON:  Plain film from the previous day.  CT from 03/04/2021 FINDINGS: Lower chest: No acute abnormality. Hepatobiliary: Multiple gallstones are noted within the gallbladder. No wall thickening or pericholecystic fluid is noted. The liver is within normal limits. Pancreas: Unremarkable. No pancreatic ductal dilatation or surrounding inflammatory changes. Spleen: Normal in size without focal abnormality. Adrenals/Urinary Tract: Adrenal glands are within normal limits. Kidneys show bilateral Simple appearing cysts. The largest of these again arises from the lower pole of the left kidney measuring 7.9 cm stable in appearance. No further follow-up is recommended. No renal calculi are seen. No obstructive changes are noted. The bladder is partially distended. Stomach/Bowel: Scattered diverticular change of the colon is noted without evidence of diverticulitis. The appendix is within normal limits. Small bowel and stomach are unremarkable. Vascular/Lymphatic: Aortic atherosclerosis. No enlarged abdominal or pelvic lymph nodes. Reproductive: Prostate is unremarkable. Other: No abdominal wall hernia or abnormality. No abdominopelvic ascites. Musculoskeletal: Degenerative changes of lumbar spine are noted. IMPRESSION: Cholelithiasis without complicating factors. Diverticulosis without diverticulitis. No other focal abnormality is noted. Electronically Signed   By: Alcide Clever M.D.   On: 08/21/2023 00:13   DG Abdomen 1 View  Result Date: 08/20/2023 CLINICAL DATA:  Constipation EXAM: ABDOMEN - 1 VIEW COMPARISON:  None Available. FINDINGS: Nonobstructive bowel gas pattern. Normal colonic stool burden. Mild degenerative changes of the lower lumbar spine. IMPRESSION:  Negative. Electronically Signed   By: Charline Bills M.D.   On: 08/20/2023 20:08     Data Reviewed: Relevant notes from primary care and specialist visits, past discharge summaries as available in EHR, including Care Everywhere. Prior diagnostic testing as pertinent to current admission  diagnoses Updated medications and problem lists for reconciliation ED course, including vitals, labs, imaging, treatment and response to treatment Triage notes, nursing and pharmacy notes and ED provider's notes Notable results as noted in HPI   Assessment and Plan: * Intractable vomiting Uncertain etiology.  Similar hospitalization with intractable nausea a month prior CT abdomen and pelvis and right upper quadrant ultrasound showing cholelithiasis and diverticulosis without complicating factors SMA ultrasound in August 2024 showed no hemodynamically significant SMA stenosis Supportive care with IV fluids, IV antiemetics Will keep n.p.o. tonight IV Protonix Given recurrent nature can will consult GI for consideration of endoscopy  Anemia During hospitalization a month prior patient had a drop in hemoglobin from 11-8.6 but currently stable at 8.7 GI consulted for consideration of endoscopy Keep n.p.o.  Paroxysmal atrial fibrillation (HCC) Continue aspirin and amiodarone  CAD S/P CABG x 3 2019 No complaints of chest pain, EKG nonacute and troponin normal at 17 Continue atorvastatin, enalapril, isosorbide, nitroglycerin  S/P bioprosthetic  AVR No acute issues suspected  Benign prostatic hyperplasia without lower urinary tract symptoms Continue doxazosin  Chronic diastolic heart failure (HCC) Clinically euvolemic Continue home GDMT Monitor for overload with ongoing hydration Daily weights  Stage 3b chronic kidney disease (HCC) Renal function at baseline Intermittent monitoring given intractable vomiting  Thoracic aortic aneurysm without rupture (HCC) No acute issues  suspected Continue outpatient surveillance  Essential hypertension BP controlled. Continue home antihypertensives    DVT prophylaxis: Lovenox  Consults: none  Advance Care Planning:   Code Status: Prior   Family Communication: none  Disposition Plan: Back to previous home environment  Severity of Illness: The appropriate patient status for this patient is INPATIENT. Inpatient status is judged to be reasonable and necessary in order to provide the required intensity of service to ensure the patient's safety. The patient's presenting symptoms, physical exam findings, and initial radiographic and laboratory data in the context of their chronic comorbidities is felt to place them at high risk for further clinical deterioration. Furthermore, it is not anticipated that the patient will be medically stable for discharge from the hospital within 2 midnights of admission.   * I certify that at the point of admission it is my clinical judgment that the patient will require inpatient hospital care spanning beyond 2 midnights from the point of admission due to high intensity of service, high risk for further deterioration and high frequency of surveillance required.*  Author: Andris Baumann, MD 08/21/2023 2:21 AM  For on call review [email or web address].

## 2023-08-21 NOTE — Assessment & Plan Note (Addendum)
No acute issues suspected Continue outpatient surveillance

## 2023-08-21 NOTE — Assessment & Plan Note (Signed)
Renal function at baseline Intermittent monitoring given intractable vomiting

## 2023-08-21 NOTE — Discharge Summary (Signed)
Physician Discharge Summary   Patient: Rodney Wiley MRN: [id number] DOB: December 31, 1935  Admit date:     08/20/2023  Discharge date: 08/21/23  Discharge Physician: Loyce Dys   PCP: Armando Gang, FNP   Recommendations at discharge:  Follow-up with primary care physician  Discharge Diagnoses:  Intractable vomiting-resolved Anemia Paroxysmal atrial fibrillation (HCC) CAD S/P CABG x 3 2019 S/P bioprosthetic  AVR Benign prostatic hyperplasia without lower urinary tract symptoms Chronic diastolic heart failure (HCC) Stage 3b chronic kidney disease (HCC) Thoracic aortic aneurysm without rupture [facility]) Essential hypertension   Hospital Course: Rodney Wiley is a 87 y.o. male with medical history significant for hypertension, CAD post CABG (3/19) aortic regurgitation and thoracic aortic aneurysm status post Bentall with bioprosthetic aortic valve replacement (3/19), SVT and frequent PACs, HFpEF, and chronic kidney disease stage 4 , SMA stenosis, renal artery stenosis, GERD , hospitalized fairly recently from 8/20 to 8/23 with intractable nausea ruled out for hemodynamically significant SMA stenosis with mesenteric ultrasound who presents to the ED with intractable vomiting that started the day prior. CT abdomen and pelvis showed cholelithiasis and diverticulosis with no complicating factors Right upper quadrant ultrasound similarly shows cholelithiasis without complicating factors. Patient received IV fluid as well as antiemetics: Patient was seen by gastroenterologist and no endoscopic intervention is planned, nausea and vomiting is now resolved and patient is able to tolerated diet.  Nausea and vomiting thought to be due to possible opioid related and therefore patient has been encouraged only to take OPIOD medication only when pain is extremely severe otherwise to stay away from opioids as much as possible.  Patient is therefore being discharged home today to follow-up with primary care  physician.   Consultants: Gastroenterologist Procedures performed: None Disposition: Home Diet recommendation:  Cardiac diet DISCHARGE MEDICATION: Allergies as of 08/21/2023   No Known Allergies      Medication List     TAKE these medications    amiodarone 100 MG tablet Commonly known as: PACERONE TAKE 1 TABLET BY MOUTH EVERY DAY   amLODipine 10 MG tablet Commonly known as: NORVASC Take 5 mg by mouth daily.   aspirin EC 81 MG tablet Take 81 mg by mouth daily.   atorvastatin 40 MG tablet Commonly known as: LIPITOR TAKE 1 TABLET BY MOUTH EVERY DAY   cholecalciferol 25 MCG (1000 UNIT) tablet Commonly known as: VITAMIN D3 Take 1,000 Units by mouth daily.   doxazosin 2 MG tablet Commonly known as: CARDURA Take 1 tablet (2 mg total) by mouth 2 (two) times daily.   enalapril 10 MG tablet Commonly known as: VASOTEC Take 10 mg by mouth daily.   Farxiga 10 MG Tabs tablet Generic drug: dapagliflozin propanediol Take 1 tablet (10 mg total) by mouth daily.   gabapentin 100 MG capsule Commonly known as: NEURONTIN Take 100 mg by mouth at bedtime.   hydrALAZINE 50 MG tablet Commonly known as: APRESOLINE Take 50 mg by mouth 2 (two) times daily. What changed: Another medication with the same name was removed. Continue taking this medication, and follow the directions you see here.   isosorbide mononitrate 60 MG 24 hr tablet Commonly known as: IMDUR TAKE 1 TABLET BY MOUTH EVERY DAY   levothyroxine 25 MCG tablet Commonly known as: SYNTHROID Take 25 mcg by mouth every morning.   nitroGLYCERIN 0.4 MG SL tablet Commonly known as: Nitrostat Place 1 tablet (0.4 mg total) under the tongue every 5 (five) minutes as needed for chest pain. Maximum of 3  doses.   ondansetron 4 MG tablet Commonly known as: ZOFRAN Take 1 tablet (4 mg total) by mouth every 6 (six) hours as needed for nausea.   oxyCODONE 5 MG immediate release tablet Commonly known as: Oxy IR/ROXICODONE Take  5 mg by mouth every 6 (six) hours as needed.   pantoprazole 40 MG tablet Commonly known as: Protonix Take 1 tablet (40 mg total) by mouth daily.   torsemide 10 MG tablet Commonly known as: DEMADEX TAKE 1 TABLET BY MOUTH EVERY DAY AS NEEDED   traMADol 50 MG tablet Commonly known as: ULTRAM Take 50 mg by mouth every 6 (six) hours as needed.        Discharge Exam: Vitals and nursing note reviewed.  Constitutional:      General: He is not in acute distress. HENT:     Head: Normocephalic and atraumatic.  Cardiovascular:     Rate and Rhythm: Normal rate and regular rhythm.     Heart sounds: Normal heart sounds.  Pulmonary:     Effort: Pulmonary effort is normal.     Breath sounds: Normal breath sounds.  Abdominal:     Palpations: Abdomen is soft.     Tenderness: There is no abdominal tenderness.  Neurological:     Mental Status: Mental status is at baseline.   Condition at discharge: good  Discharge time spent:  34 minutes.  Signed: Loyce Dys, MD [facility] 08/21/2023

## 2023-08-21 NOTE — Assessment & Plan Note (Signed)
No complaints of chest pain, EKG nonacute and troponin normal at 17 Continue atorvastatin, enalapril, isosorbide, nitroglycerin

## 2023-08-21 NOTE — Assessment & Plan Note (Signed)
Continue doxazosin

## 2023-08-23 ENCOUNTER — Other Ambulatory Visit: Payer: Self-pay

## 2023-08-23 ENCOUNTER — Emergency Department
Admission: EM | Admit: 2023-08-23 | Discharge: 2023-08-23 | Disposition: A | Discharge status: Home / Self Care | Payer: Medicare HMO | Attending: Emergency Medicine | Admitting: Emergency Medicine

## 2023-08-23 ENCOUNTER — Emergency Department: Payer: Medicare HMO

## 2023-08-23 DIAGNOSIS — I2510 Atherosclerotic heart disease of native coronary artery without angina pectoris: Secondary | ICD-10-CM | POA: Insufficient documentation

## 2023-08-23 DIAGNOSIS — I130 Hypertensive heart and chronic kidney disease with heart failure and stage 1 through stage 4 chronic kidney disease, or unspecified chronic kidney disease: Secondary | ICD-10-CM | POA: Diagnosis not present

## 2023-08-23 DIAGNOSIS — Z1152 Encounter for screening for COVID-19: Secondary | ICD-10-CM | POA: Diagnosis not present

## 2023-08-23 DIAGNOSIS — N189 Chronic kidney disease, unspecified: Secondary | ICD-10-CM | POA: Diagnosis not present

## 2023-08-23 DIAGNOSIS — K8020 Calculus of gallbladder without cholecystitis without obstruction: Secondary | ICD-10-CM | POA: Diagnosis not present

## 2023-08-23 DIAGNOSIS — E860 Dehydration: Secondary | ICD-10-CM | POA: Diagnosis not present

## 2023-08-23 DIAGNOSIS — R0602 Shortness of breath: Secondary | ICD-10-CM | POA: Diagnosis present

## 2023-08-23 DIAGNOSIS — R112 Nausea with vomiting, unspecified: Secondary | ICD-10-CM

## 2023-08-23 DIAGNOSIS — I509 Heart failure, unspecified: Secondary | ICD-10-CM | POA: Insufficient documentation

## 2023-08-23 LAB — TROPONIN I (HIGH SENSITIVITY)
Troponin I (High Sensitivity): 29 ng/L — ABNORMAL HIGH (ref <18)
Troponin I (High Sensitivity): 29 ng/L — ABNORMAL HIGH (ref <18)

## 2023-08-23 LAB — COMPREHENSIVE METABOLIC PANEL
ALT: 14 U/L (ref 0–44)
AST: 20 U/L (ref 15–41)
Albumin: 3.8 g/dL (ref 3.5–5.0)
Alkaline Phosphatase: 52 U/L (ref 38–126)
Anion gap: 9 (ref 5–15)
BUN: 26 mg/dL — ABNORMAL HIGH (ref 8–23)
CO2: 20 mmol/L — ABNORMAL LOW (ref 22–32)
Calcium: 8.5 mg/dL — ABNORMAL LOW (ref 8.9–10.3)
Chloride: 103 mmol/L (ref 98–111)
Creatinine, Ser: 2.2 mg/dL — ABNORMAL HIGH (ref 0.61–1.24)
GFR, Estimated: 29 mL/min — ABNORMAL LOW (ref >60)
Glucose, Bld: 110 mg/dL — ABNORMAL HIGH (ref 70–99)
Potassium: 4.3 mmol/L (ref 3.5–5.1)
Sodium: 132 mmol/L — ABNORMAL LOW (ref 135–145)
Total Bilirubin: 1.2 mg/dL (ref 0.3–1.2)
Total Protein: 6.2 g/dL — ABNORMAL LOW (ref 6.5–8.1)

## 2023-08-23 LAB — RESP PANEL BY RT-PCR (RSV, FLU A&B, COVID)  RVPGX2
Influenza A by PCR: NEGATIVE
Influenza B by PCR: NEGATIVE
Resp Syncytial Virus by PCR: NEGATIVE
SARS Coronavirus 2 by RT PCR: NEGATIVE

## 2023-08-23 LAB — CBC
HCT: 25.5 % — ABNORMAL LOW (ref 39.0–52.0)
Hemoglobin: 8.7 g/dL — ABNORMAL LOW (ref 13.0–17.0)
MCH: 33.1 pg (ref 26.0–34.0)
MCHC: 34.1 g/dL (ref 30.0–36.0)
MCV: 97 fL (ref 80.0–100.0)
Platelets: 145 10*3/uL — ABNORMAL LOW (ref 150–400)
RBC: 2.63 MIL/uL — ABNORMAL LOW (ref 4.22–5.81)
RDW: 13.1 % (ref 11.5–15.5)
WBC: 6 10*3/uL (ref 4.0–10.5)
nRBC: 0 % (ref 0.0–0.2)

## 2023-08-23 LAB — BRAIN NATRIURETIC PEPTIDE: B Natriuretic Peptide: 91.6 pg/mL (ref 0.0–100.0)

## 2023-08-23 LAB — LIPASE, BLOOD: Lipase: 39 U/L (ref 11–51)

## 2023-08-23 MED ORDER — DROPERIDOL 2.5 MG/ML IJ SOLN
1.2500 mg | Freq: Once | INTRAMUSCULAR | Status: AC
  Administered 2023-08-23: 1.25 mg via INTRAVENOUS
  Filled 2023-08-23: qty 2

## 2023-08-23 MED ORDER — SODIUM CHLORIDE 0.9 % IV BOLUS
500.0000 mL | Freq: Once | INTRAVENOUS | Status: AC
  Administered 2023-08-23: 500 mL via INTRAVENOUS

## 2023-08-23 NOTE — ED Notes (Signed)
Blue top sent to lab if needed.

## 2023-08-23 NOTE — ED Provider Triage Note (Signed)
Emergency Medicine Provider Triage Evaluation Note  Rodney Wiley , a 87 y.o. male  was evaluated in triage.  Pt complains of nausea and SOB. He was seen in the ED a few days ago and states his medication has worn off. He was admitted to the hospital on Saturday night and then discharged Sunday afternoon.   Review of Systems  Positive: Nausea, burping, SOB Negative: Abdominal pain  Physical Exam  There were no vitals taken for this visit. Gen:   Awake, no distress   Resp:  Normal effort  MSK:   Moves extremities without difficulty  Other:    Medical Decision Making  Medically screening exam initiated at 11:54 AM.  Appropriate orders placed.  Saurabh Carmona [place] was informed that the remainder of the evaluation will be completed by another provider, this initial triage assessment does not replace that evaluation, and the importance of remaining in the ED until their evaluation is complete.    Cameron Ali, PA-C 08/23/23 1157

## 2023-08-23 NOTE — ED Provider Notes (Signed)
[facility] Provider Note    Event Date/Time   First MD Initiated Contact with Patient 08/23/23 1503     (approximate)   History   Shortness of Breath   HPI  Rodney Wiley is a 87 y.o. male   Past medical history of hypertension, CAD, aortic regurgitation status post aortic valve replacement, SVT, PACs, heart failure with preserved ejection fraction, CKD, SMA stenosis, renal artery stenosis, GERD, who presents back to the emergency department with recurrence of nausea and vomiting.  He was hospitalized twice in late August and again in early September for the same symptoms.  His workup at that time including being ruled out for hemodynamically significant SMA stenosis with a mesenteric ultrasound, found to have gallstones on further imaging, ultimately discharged after symptom relief.  He was seen by gastroenterologist during his hospitalization as well and no endoscopic intervention is planned after resolution of nausea and vomiting and ability to tolerate diet.  Today he has a recurrence of his symptoms.  He reports no pain whatsoever in the chest or abdomen.  He has some mild shortness of breath that is intermittent, longstanding and unchanged.  He has no respiratory infectious symptoms.  He was able to make a bowel movement yesterday.  He has no urinary complaints.    Independent Historian contributed to assessment above: Family member is at bedside to corroborate information past medical history as above  External Medical Documents Reviewed: Discharge summary from 08/21/2023 documenting the above hospitalizations for nausea and vomiting and recent workup      Physical Exam   Triage Vital Signs: ED Triage Vitals [08/23/23 1156]  Encounter Vitals Group     BP (!) 108/49     Systolic BP Percentile      Diastolic BP Percentile      Pulse Rate 67     Resp 17     Temp 97.7 F (36.5 C)     Temp Source Oral     SpO2 98 %     Weight 160 lb 11.5 oz (72.9  kg)     Height 5\' 7"  (1.702 m)     Head Circumference      Peak Flow      Pain Score 0     Pain Loc      Pain Education      Exclude from Growth Chart     Most recent vital signs: Vitals:   08/23/23 1630 08/23/23 1643  BP: (!) 156/73   Pulse: 73   Resp: (!) 21   Temp:  98.1 F (36.7 C)  SpO2: 99%     General: Awake, no distress.  CV:  Good peripheral perfusion.  Resp:  Normal effort.  Abd:  No distention.  Other:  Awake alert comfortable pleasant gentleman no acute distress with normal hemodynamics no fever.  Clear lungs without any focality wheezing or rales.  Soft nontender abdomen to deep palpation all quadrant without any masses or distention noted.  He appears slightly dehydrated with some poor skin turgor and slightly dry mucous membranes.   ED Results / Procedures / Treatments   Labs (all labs ordered are listed, but only abnormal results are displayed) Labs Reviewed  CBC - Abnormal; Notable for the following components:      Result Value   RBC 2.63 (*)    Hemoglobin 8.7 (*)    HCT 25.5 (*)    Platelets 145 (*)    All other components within normal limits  COMPREHENSIVE METABOLIC PANEL - Abnormal; Notable for the following components:   Sodium 132 (*)    CO2 20 (*)    Glucose, Bld 110 (*)    BUN 26 (*)    Creatinine, Ser 2.20 (*)    Calcium 8.5 (*)    Total Protein 6.2 (*)    GFR, Estimated 29 (*)    All other components within normal limits  TROPONIN I (HIGH SENSITIVITY) - Abnormal; Notable for the following components:   Troponin I (High Sensitivity) 29 (*)    All other components within normal limits  TROPONIN I (HIGH SENSITIVITY) - Abnormal; Notable for the following components:   Troponin I (High Sensitivity) 29 (*)    All other components within normal limits  RESP PANEL BY RT-PCR (RSV, FLU A&B, COVID)  RVPGX2  LIPASE, BLOOD  BRAIN NATRIURETIC PEPTIDE     I ordered and reviewed the above labs they are notable for his hemoglobin is 8.7  consistent with prior.  His creatinine is slightly elevated today at 2.2.  His troponin is 29 initially.  EKG  ED ECG REPORT I, Pilar Jarvis, the attending physician, personally viewed and interpreted this ECG.   Date: 08/23/2023  EKG Time: 1634  Rate: 70  Rhythm: sinus  Axis: nl  Intervals:long pr  ST&T Change: no stemi    RADIOLOGY I independently reviewed and interpreted right upper quadrant ultrasound to see cholelithiasis without frank signs of cholecystitis. I also reviewed radiologist's formal read.   PROCEDURES:  Critical Care performed: No  Procedures   MEDICATIONS ORDERED IN ED: Medications  sodium chloride 0.9 % bolus 500 mL (500 mLs Intravenous New Bag/Given 08/23/23 1639)  droperidol (INAPSINE) 2.5 MG/ML injection 1.25 mg (1.25 mg Intravenous Given 08/23/23 1640)     IMPRESSION / MDM / ASSESSMENT AND PLAN / ED COURSE  I reviewed the triage vital signs and the nursing notes.                                Patient's presentation is most consistent with acute presentation with potential threat to life or bodily function.  Differential diagnosis includes, but is not limited to, symptomatic cholelithiasis, biliary colic, cholecystitis, pancreatitis, SMA stenosis, bowel obstruction, mesenteric ischemia, respiratory infection, CHF exacerbation   The patient is on the cardiac monitor to evaluate for evidence of arrhythmia and/or significant heart rate changes.  MDM:    Is a patient with recurrence of nausea and vomiting with extensive workup ruled out for SMA stenosis bowel obstruction and previous recent workups.  He did have cholelithiasis but he denies any pain and is nontender in the right upper quadrant to palpation.  He looks nontoxic and comfortable.  Will repeat labs, LFTs, right upper quadrant ultrasound today.  Treat with IV fluids and IV antiemetic and reassess ability to tolerate p.o.  Looks slightly dehydrated will give fluids as above.  In terms  of his intermittent shortness of breath, there is no significant change today, I considered symptomatic anemia but his H&H is unchanged compared to prior, he denies any GI bleeding, and his chest x-ray shows no signs of infection he reports no terms of respiratory infection and he has no chest pain so I doubt PE, ACS, respiratory infection.  Ultimately if workup above is unremarkable, and patient can be symptomatically managed and tolerates p.o., he can be discharged with PMD and gastroenterology follow-up.  If he continues to have intractable nausea and  vomiting will admit with potential general surgery consultation in case these gallstones are the cause of his recurrent symptoms.   -- Symptoms resolved with droperidol, fluids, tolerating p.o.  Workup largely unremarkable except for gallstones with no signs of cholecystitis labs unremarkable.  Patient stable.  To be discharged with PMD and general surgery follow-up.  Return precautions given.      FINAL CLINICAL IMPRESSION(S) / ED DIAGNOSES   Final diagnoses:  Nausea and vomiting, unspecified vomiting type  Dehydration  Gallstones     Rx / DC Orders   ED Discharge Orders     None        Note:  This document was prepared using Dragon voice recognition software and may include unintentional dictation errors.    Pilar Jarvis, MD 08/23/23 [phone]

## 2023-08-23 NOTE — ED Notes (Signed)
Pt up to the bathroom at this time.

## 2023-08-23 NOTE — ED Triage Notes (Signed)
Pt here with SOB and nausea. Pt denies abd or chest pain. Pt states he has not been able to keep anything down since he was released from the hospital on Sunday. Pt stable in triage.

## 2023-08-23 NOTE — Discharge Instructions (Signed)
Continue to take your Zofran twice daily for nausea and vomiting.  Stay well-hydrated by drinking plenty of fluids.  Call Dr. Aleen Campi of general surgery to discuss an elective procedure as gallbladder stones may be the cause of your nausea and vomiting.   Thank you for choosing [place] for your health care today!  Please see your primary doctor this week for a follow up appointment.   If you have any new, worsening, or unexpected symptoms call your doctor right away or come back to the emergency department for reevaluation.  It was my pleasure to care for you today.   Daneil Dan Modesto Charon, MD

## 2023-08-31 DIAGNOSIS — E871 Hypo-osmolality and hyponatremia: Secondary | ICD-10-CM | POA: Insufficient documentation

## 2023-09-02 DIAGNOSIS — R5381 Other malaise: Secondary | ICD-10-CM | POA: Insufficient documentation

## 2023-09-05 ENCOUNTER — Encounter: Payer: Self-pay | Admitting: Oncology

## 2023-09-12 ENCOUNTER — Ambulatory Visit: Payer: Medicare HMO | Admitting: Surgery

## 2023-09-23 ENCOUNTER — Inpatient Hospital Stay: Payer: Medicare HMO | Admitting: Oncology

## 2023-09-23 ENCOUNTER — Encounter: Payer: Self-pay | Admitting: Oncology

## 2023-09-23 ENCOUNTER — Telehealth: Payer: Self-pay

## 2023-09-23 ENCOUNTER — Inpatient Hospital Stay: Payer: Medicare HMO | Attending: Oncology

## 2023-09-23 ENCOUNTER — Other Ambulatory Visit: Payer: Self-pay

## 2023-09-23 VITALS — BP 114/56 | HR 79 | Temp 97.8°F | Resp 18 | Wt 154.0 lb

## 2023-09-23 DIAGNOSIS — N184 Chronic kidney disease, stage 4 (severe): Secondary | ICD-10-CM

## 2023-09-23 DIAGNOSIS — I129 Hypertensive chronic kidney disease with stage 1 through stage 4 chronic kidney disease, or unspecified chronic kidney disease: Secondary | ICD-10-CM | POA: Diagnosis present

## 2023-09-23 DIAGNOSIS — D631 Anemia in chronic kidney disease: Secondary | ICD-10-CM | POA: Insufficient documentation

## 2023-09-23 DIAGNOSIS — N183 Chronic kidney disease, stage 3 unspecified: Secondary | ICD-10-CM

## 2023-09-23 LAB — URINALYSIS, COMPLETE (UACMP) WITH MICROSCOPIC
Bilirubin Urine: NEGATIVE
Glucose, UA: NEGATIVE mg/dL
Hgb urine dipstick: NEGATIVE
Ketones, ur: NEGATIVE mg/dL
Leukocytes,Ua: NEGATIVE
Nitrite: NEGATIVE
Protein, ur: 30 mg/dL — AB
Specific Gravity, Urine: 1.018 (ref 1.005–1.030)
pH: 5 (ref 5.0–8.0)

## 2023-09-23 LAB — CBC (CANCER CENTER ONLY)
HCT: 23.5 % — ABNORMAL LOW (ref 39.0–52.0)
Hemoglobin: 7.6 g/dL — ABNORMAL LOW (ref 13.0–17.0)
MCH: 33.3 pg (ref 26.0–34.0)
MCHC: 32.3 g/dL (ref 30.0–36.0)
MCV: 103.1 fL — ABNORMAL HIGH (ref 80.0–100.0)
Platelet Count: 199 10*3/uL (ref 150–400)
RBC: 2.28 MIL/uL — ABNORMAL LOW (ref 4.22–5.81)
RDW: 13.8 % (ref 11.5–15.5)
WBC Count: 4.3 10*3/uL (ref 4.0–10.5)
nRBC: 0 % (ref 0.0–0.2)

## 2023-09-23 LAB — FOLATE: Folate: 7.9 ng/mL (ref >5.9)

## 2023-09-23 LAB — IRON AND TIBC
Iron: 66 ug/dL (ref 45–182)
Saturation Ratios: 29 % (ref 17.9–39.5)
TIBC: 227 ug/dL — ABNORMAL LOW (ref 250–450)
UIBC: 161 ug/dL

## 2023-09-23 LAB — FERRITIN: Ferritin: 200 ng/mL (ref 24–336)

## 2023-09-23 NOTE — Progress Notes (Signed)
Patient here for oncology follow-up appointment, concerns of urinary frequency, stinging, diarrhea and labs(hgb& Na)

## 2023-09-23 NOTE — Telephone Encounter (Signed)
Informed daughter in law of plan for epo injections per Dr. Smith Robert, all questions answered

## 2023-09-23 NOTE — Progress Notes (Signed)
Hematology/Oncology Consult note [facility]  [phone] [phone]  Patient Care Team: Armando Gang, FNP as PCP - General (Family Medicine) End, Cristal Deer, MD as PCP - Cardiology (Cardiology) Creig Hines, MD as Consulting Physician (Oncology)   Name of the patient: Rodney Wiley  [id number]  [birth year]   Date of visit: 09/23/23  Diagnosis-anemia of chronic kidney disease  Chief complaint/ Reason for visit-routine follow-up of anemia  Heme/Onc history: patient is a 87 year old male with a past medical history significant for hypertension, hyperlipidemia, stage III CKD and history of aortic insufficiency as well as ascending aortic aneurysm.  He recently underwent bioprosthetic aortic valve replacement, CABG x3 and resuspension of the left atrium on 02/22/2018.    Results of blood work in June 2021 were consistent with iron deficiency along with CKD. He received venofer in July 2021.  He has not required any EPO injections so far  Patient's hemoglobin was stable between 10-11 up until August 2024.  He had 2 back-to-back hospitalizations for syncope, hyponatremia and nausea vomiting.  Following that his hemoglobin has been around 8  Interval history-he is doing better since his hospitalization but still feels quite fatigued at this time.  Reports dysuria for the last couple of days  ECOG PS- 2 Pain scale- 0  Review of systems- Review of Systems  Constitutional:  Positive for malaise/fatigue. Negative for chills, fever and weight loss.  HENT:  Negative for congestion, ear discharge and nosebleeds.   Eyes:  Negative for blurred vision.  Respiratory:  Negative for cough, hemoptysis, sputum production, shortness of breath and wheezing.   Cardiovascular:  Negative for chest pain, palpitations, orthopnea and claudication.  Gastrointestinal:  Negative for abdominal pain, blood in stool, constipation, diarrhea, heartburn, melena,  nausea and vomiting.  Genitourinary:  Negative for dysuria, flank pain, frequency, hematuria and urgency.  Musculoskeletal:  Negative for back pain, joint pain and myalgias.  Skin:  Negative for rash.  Neurological:  Negative for dizziness, tingling, focal weakness, seizures, weakness and headaches.  Endo/Heme/Allergies:  Does not bruise/bleed easily.  Psychiatric/Behavioral:  Negative for depression and suicidal ideas. The patient does not have insomnia.       No Known Allergies   Past Medical History:  Diagnosis Date   Anemia    Aortic insufficiency    a. 02/2018 s/p bioprosthetic AVR; 04/2018 Echo: EF 50-55%, Gr2 DD, Ao bioprosthesis, mean grad , Ao root/Asc Ao nl in size, mild MR, mildly dil LA, nl RV fxn. Nl PASP.   Arthropathy    Ascending aortic aneurysm (HCC)    a. 12/2016 MRA: 4.3cm @ sinus of valsalva, 4.9cm above sinotubular jxn; b. 02/2018 s/p biological Bentall and AVR.   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 02/2018 Cath: LAD 20ost, 40p, 71m, LCX 60p, OM2 60, OM3 85, RCA 10p/m w/ L->R and R->R collats;  b. 02/2018 CABG x 3: LIMA to LAD, SVG to OM2, SVG to PDA, EVH via right thigh.   CKD (chronic kidney disease), stage III (HCC)    Diverticulitis    Essential hypertension    GERD (gastroesophageal reflux disease)    Hemorrhoids    History of chicken pox    History of Helicobacter pylori infection 06/2007   Hyperlipidemia    Hypertension    Left renal artery stenosis (HCC)    a. 01/2017 RA u/s: moderate L RAS.   Lower extremity edema    Nonrheumatic mitral (  valve) insufficiency    a. 12/2016 Echo: mild to mod MR; b. 09/2017 TEE: mild to mod MR; c. 04/2018 Echo: Mild MR.   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 s/p 21 mm Edwards Inspiris Resilia stented bovine pericardial tissue valve and 24 mm Gelweave Valsalva  synthetic root conduit with reimplantation of left main coronary artery.     Past Surgical History:  Procedure Laterality Date   AORTIC VALVE REPAIR N/A 02/22/2018   Procedure: AORTIC VALVE REPLACEMENT -Biological Bentall aortic root replacement,;  Surgeon: Purcell Nails, MD;  Location: [facility] OR;  Service: Open Heart Surgery;  Laterality: N/A;   bypass surgery     CARDIAC CATHETERIZATION     01/16/2018   COLONOSCOPY  2011   by Dr. Bluford Kaufmann with findings of diverticulosis   CORONARY ARTERY BYPASS GRAFT N/A 02/22/2018   Procedure: CORONARY ARTERY BYPASS GRAFTING (CABG) x three, using left internal mammary artery and right leg greater saphenous vein harvested endoscopically;  Surgeon: Purcell Nails, MD;  Location: [facility] OR;  Service: Open Heart Surgery;  Laterality: N/A;   CORONARY/GRAFT ANGIOGRAPHY N/A 01/16/2018   Procedure: CORONARY/GRAFT ANGIOGRAPHY;  Surgeon: Yvonne Kendall, MD;  Location: [facility] INVASIVE CV LAB;  Service: Cardiovascular;  Laterality: N/A;   ELBOW SURGERY Left    hemorhoidectomy     RIGHT HEART CATH N/A 01/16/2018   Procedure: RIGHT HEART CATH;  Surgeon: Yvonne Kendall, MD;  Location: [facility] INVASIVE CV LAB;  Service: Cardiovascular;  Laterality: N/A;   SHOULDER ARTHROSCOPY WITH ROTATOR CUFF REPAIR Right    SHOULDER SURGERY Right    TEE WITHOUT CARDIOVERSION N/A 09/27/2017   Procedure: TRANSESOPHAGEAL ECHOCARDIOGRAM (TEE);  Surgeon: Lars Masson, MD;  Location: [facility] ENDOSCOPY;  Service: Cardiovascular;  Laterality: N/A;   TEE WITHOUT CARDIOVERSION N/A 02/22/2018   Procedure: TRANSESOPHAGEAL ECHOCARDIOGRAM (TEE);  Surgeon: Purcell Nails, MD;  Location: [facility] OR;  Service: Open Heart Surgery;  Laterality: N/A;   THORACIC AORTIC ANEURYSM REPAIR N/A 02/22/2018   Procedure: THORACIC ASCENDING ANEURYSM REPAIR (AAA) Resection of ascending aorta aneurysm;  Surgeon: Purcell Nails, MD;  Location: [facility] OR;  Service: Open Heart Surgery;  Laterality: N/A;    Social History   Socioeconomic  History   Marital status: Widowed    Spouse name: Not on file   Number of children: Not on file   Years of education: Not on file   Highest education level: Not on file  Occupational History   Not on file  Tobacco Use   Smoking status: Never   Smokeless tobacco: Never  Vaping Use   Vaping status: Never Used  Substance and Sexual Activity   Alcohol use: No   Drug use: No   Sexual activity: Not Currently  Other Topics Concern   Not on file  Social History Narrative   Not on file   Social Determinants of Health   Financial Resource Strain: Patient Declined (09/01/2023)   [facility]   Overall Financial Resource Strain (CARDIA)    Difficulty of Paying Living Expenses: Patient declined  Food Insecurity: No Food Insecurity (09/01/2023)   Received from [facility]   Hunger Vital Sign    Worried About Running Out of Food in the Last Year: Never true    Ran Out of Food in the Last Year: Never true  Transportation Needs: No Transportation Needs (09/01/2023)   Received from [facility]   PRAPARE - Transportation    Lack of Transportation (Medical): No    Lack of Transportation (Non-Medical): No  Physical Activity: Not on file  Stress: Not on file  Social Connections: Not on file  Intimate Partner Violence: Not At Risk (07/26/2023)   Humiliation, Afraid, Rape, and Kick questionnaire    Fear of Current or Ex-Partner: No    Emotionally Abused: No    Physically Abused: No    Sexually Abused: No    Family History  Problem Relation Age of Onset   Hypertension Mother    Stroke Mother    Leukemia Father    Heart attack Paternal Grandmother    Stroke Paternal Grandfather    Heart Problems Son 27   Heart Problems Son 31   Uterine cancer Sister    Prostate cancer Brother    Colon cancer Brother    Kidney failure Brother    Stroke Sister      Current Outpatient Medications:    amLODipine (NORVASC) 10 MG tablet, Take 5 mg by mouth daily., Disp: , Rfl:     aspirin EC 81 MG tablet, Take 81 mg by mouth daily., Disp: , Rfl:    atorvastatin (LIPITOR) 40 MG tablet, TAKE 1 TABLET BY MOUTH EVERY DAY, Disp: 90 tablet, Rfl: 0   cholecalciferol (VITAMIN D3) 25 MCG (1000 UT) tablet, Take 1,000 Units by mouth daily., Disp: , Rfl:    enalapril (VASOTEC) 10 MG tablet, Take 10 mg by mouth daily., Disp: , Rfl:    hydrALAZINE (APRESOLINE) 50 MG tablet, Take 50 mg by mouth 2 (two) times daily., Disp: , Rfl:    isosorbide mononitrate (IMDUR) 60 MG 24 hr tablet, TAKE 1 TABLET BY MOUTH EVERY DAY, Disp: 90 tablet, Rfl: 2   nitroGLYCERIN (NITROSTAT) 0.4 MG SL tablet, Place 1 tablet (0.4 mg total) under the tongue every 5 (five) minutes as needed for chest pain. Maximum of 3 doses., Disp: 25 tablet, Rfl: 2   ondansetron (ZOFRAN) 4 MG tablet, Take 1 tablet (4 mg total) by mouth every 6 (six) hours as needed for nausea., Disp: 20 tablet, Rfl: 0   pantoprazole (PROTONIX) 40 MG tablet, Take 1 tablet (40 mg total) by mouth daily., Disp: 30 tablet, Rfl: 1   torsemide (DEMADEX) 10 MG tablet, TAKE 1 TABLET BY MOUTH EVERY DAY AS NEEDED, Disp: 90 tablet, Rfl: 0   amiodarone (PACERONE) 100 MG tablet, TAKE 1 TABLET BY MOUTH EVERY DAY (Patient not taking: Reported on 09/23/2023), Disp: 90 tablet, Rfl: 0   doxazosin (CARDURA) 2 MG tablet, Take 1 tablet (2 mg total) by mouth 2 (two) times daily. (Patient not taking: Reported on 09/23/2023), Disp: 60 tablet, Rfl: 0   FARXIGA 10 MG TABS tablet, Take 1 tablet (10 mg total) by mouth daily. (Patient not taking: Reported on 07/26/2023), Disp: 90 tablet, Rfl: 3   gabapentin (NEURONTIN) 100 MG capsule, Take 100 mg by mouth at bedtime. (Patient not taking: Reported on 09/23/2023), Disp: , Rfl:    levothyroxine (SYNTHROID) 25 MCG tablet, Take 25 mcg by mouth every morning. (Patient not taking: Reported on 07/26/2023), Disp: , Rfl:    oxyCODONE (OXY IR/ROXICODONE) 5 MG immediate release tablet, Take 5 mg by mouth every 6 (six) hours as needed.  (Patient not taking: Reported on 09/23/2023), Disp: , Rfl:    traMADol (ULTRAM) 50 MG tablet, Take 50 mg by mouth every 6 (six) hours as needed. (Patient not taking: Reported on 09/23/2023), Disp: , Rfl:   Physical  exam:  Vitals:   09/23/23 1014  BP: (!) 114/56  Pulse: 79  Resp: 18  Temp: 97.8 F (36.6 C)  TempSrc: Oral  SpO2: 99%  Weight: 154 lb (69.9 kg)   Physical Exam Cardiovascular:     Rate and Rhythm: Normal rate and regular rhythm.     Heart sounds: Normal heart sounds.  Pulmonary:     Effort: Pulmonary effort is normal.     Breath sounds: Normal breath sounds.  Abdominal:     General: Bowel sounds are normal.     Palpations: Abdomen is soft.  Musculoskeletal:     Right lower leg: No edema.     Left lower leg: No edema.  Skin:    General: Skin is warm and dry.  Neurological:     Mental Status: He is alert and oriented to person, place, and time.         Latest Ref Rng & Units 08/23/2023   11:58 AM  CMP  Glucose 70 - 99 mg/dL 161   BUN 8 - 23 mg/dL 26   Creatinine 0.96 - 1.24 mg/dL 0.45   Sodium 409 - 811 mmol/L 132   Potassium 3.5 - 5.1 mmol/L 4.3   Chloride 98 - 111 mmol/L 103   CO2 22 - 32 mmol/L 20   Calcium 8.9 - 10.3 mg/dL 8.5   Total Protein 6.5 - 8.1 g/dL 6.2   Total Bilirubin 0.3 - 1.2 mg/dL 1.2   Alkaline Phos 38 - 126 U/L 52   AST 15 - 41 U/L 20   ALT 0 - 44 U/L 14       Latest Ref Rng & Units 09/23/2023    9:50 AM  CBC  WBC 4.0 - 10.5 K/uL 4.3   Hemoglobin 13.0 - 17.0 g/dL 7.6   Hematocrit 91.4 - 52.0 % 23.5   Platelets 150 - 400 K/uL 199      Assessment and plan- Patient is a 87 y.o. male here for routine follow-up of anemia of chronic kidney disease  Patient's hemoglobin was stable between 10-11 up until August 2024.  He has had anemia workup in the past which has been attributed to anemia of chronic kidney disease and he has not required any EPO injections so far.  After he was hospitalized twice since August 2024 for hyponatremia  and syncope his hemoglobin has drifted down to the eights and has remained in that range over the last 1 month.  Iron studies today are not indicative of iron deficiency.  Given that he has a component of chronic kidney disease proceeding with EPO injections to keep his hemoglobin between 10-11 would be reasonable.  Discussed risks and benefits of EPO injections including all but not limited to possible risk of thromboembolic side effects.  Patient understands and agrees to proceed as planned.  If hemoglobin does not respond despite EPO injections we will consider getting a bone marrow biopsy at that time.  I will also check myeloma panel and serum free light chains with his next set of labs.  Recent TSH B12 and folate levels are normal   Visit Diagnosis 1. Anemia of chronic kidney failure, stage 3 (moderate) (HCC)      Dr. Owens Shark, MD, MPH [facility] [phone] 09/23/2023 12:37 PM

## 2023-09-26 ENCOUNTER — Other Ambulatory Visit: Payer: Self-pay | Admitting: Oncology

## 2023-09-28 ENCOUNTER — Other Ambulatory Visit: Payer: Self-pay

## 2023-09-28 DIAGNOSIS — D509 Iron deficiency anemia, unspecified: Secondary | ICD-10-CM

## 2023-09-29 ENCOUNTER — Other Ambulatory Visit: Payer: Self-pay | Admitting: *Deleted

## 2023-09-29 ENCOUNTER — Other Ambulatory Visit: Payer: Self-pay

## 2023-09-29 ENCOUNTER — Other Ambulatory Visit: Payer: Medicare HMO

## 2023-09-29 ENCOUNTER — Inpatient Hospital Stay: Payer: Medicare HMO

## 2023-09-29 VITALS — BP 106/54

## 2023-09-29 DIAGNOSIS — D509 Iron deficiency anemia, unspecified: Secondary | ICD-10-CM

## 2023-09-29 DIAGNOSIS — D631 Anemia in chronic kidney disease: Secondary | ICD-10-CM

## 2023-09-29 DIAGNOSIS — I129 Hypertensive chronic kidney disease with stage 1 through stage 4 chronic kidney disease, or unspecified chronic kidney disease: Secondary | ICD-10-CM | POA: Diagnosis not present

## 2023-09-29 LAB — HEMOGLOBIN AND HEMATOCRIT (CANCER CENTER ONLY)
HCT: 24.8 % — ABNORMAL LOW (ref 39.0–52.0)
Hemoglobin: 8 g/dL — ABNORMAL LOW (ref 13.0–17.0)

## 2023-09-29 LAB — BASIC METABOLIC PANEL - CANCER CENTER ONLY
Anion gap: 7 (ref 5–15)
BUN: 40 mg/dL — ABNORMAL HIGH (ref 8–23)
CO2: 18 mmol/L — ABNORMAL LOW (ref 22–32)
Calcium: 8.3 mg/dL — ABNORMAL LOW (ref 8.9–10.3)
Chloride: 112 mmol/L — ABNORMAL HIGH (ref 98–111)
Creatinine: 2.24 mg/dL — ABNORMAL HIGH (ref 0.61–1.24)
GFR, Estimated: 28 mL/min — ABNORMAL LOW (ref >60)
Glucose, Bld: 106 mg/dL — ABNORMAL HIGH (ref 70–99)
Potassium: 4.6 mmol/L (ref 3.5–5.1)
Sodium: 137 mmol/L (ref 135–145)

## 2023-09-29 LAB — RETIC PANEL
Immature Retic Fract: 11.4 % (ref 2.3–15.9)
RBC.: 2.38 MIL/uL — ABNORMAL LOW (ref 4.22–5.81)
Retic Count, Absolute: 50.5 10*3/uL (ref 19.0–186.0)
Retic Ct Pct: 2.1 % (ref 0.4–3.1)
Reticulocyte Hemoglobin: 36.8 pg (ref >27.9)

## 2023-09-29 MED ORDER — DARBEPOETIN ALFA 40 MCG/0.4ML IJ SOSY
60.0000 ug | PREFILLED_SYRINGE | INTRAMUSCULAR | Status: DC
Start: 2023-09-29 — End: 2023-09-29
  Administered 2023-09-29: 60 ug via SUBCUTANEOUS
  Filled 2023-09-29: qty 0.6

## 2023-09-30 ENCOUNTER — Other Ambulatory Visit: Payer: Medicare HMO

## 2023-09-30 ENCOUNTER — Ambulatory Visit: Payer: Medicare HMO

## 2023-09-30 LAB — KAPPA/LAMBDA LIGHT CHAINS
Kappa free light chain: 54.1 mg/L — ABNORMAL HIGH (ref 3.3–19.4)
Kappa, lambda light chain ratio: 1.85 — ABNORMAL HIGH (ref 0.26–1.65)
Lambda free light chains: 29.2 mg/L — ABNORMAL HIGH (ref 5.7–26.3)

## 2023-10-03 LAB — MULTIPLE MYELOMA PANEL, SERUM
Albumin SerPl Elph-Mcnc: 3.4 g/dL (ref 2.9–4.4)
Albumin/Glob SerPl: 1.4 (ref 0.7–1.7)
Alpha 1: 0.2 g/dL (ref 0.0–0.4)
Alpha2 Glob SerPl Elph-Mcnc: 0.5 g/dL (ref 0.4–1.0)
B-Globulin SerPl Elph-Mcnc: 0.8 g/dL (ref 0.7–1.3)
Gamma Glob SerPl Elph-Mcnc: 0.9 g/dL (ref 0.4–1.8)
Globulin, Total: 2.5 g/dL (ref 2.2–3.9)
IgA: 137 mg/dL (ref 61–437)
IgG (Immunoglobin G), Serum: 953 mg/dL (ref 603–1613)
IgM (Immunoglobulin M), Srm: 73 mg/dL (ref 15–143)
Total Protein ELP: 5.9 g/dL — ABNORMAL LOW (ref 6.0–8.5)

## 2023-10-07 ENCOUNTER — Other Ambulatory Visit: Payer: Self-pay | Admitting: Internal Medicine

## 2023-10-20 ENCOUNTER — Other Ambulatory Visit: Payer: Self-pay

## 2023-10-20 DIAGNOSIS — D631 Anemia in chronic kidney disease: Secondary | ICD-10-CM

## 2023-10-21 ENCOUNTER — Inpatient Hospital Stay: Payer: Medicare HMO | Attending: Oncology

## 2023-10-21 ENCOUNTER — Inpatient Hospital Stay: Payer: Medicare HMO

## 2023-10-21 VITALS — BP 97/61 | HR 87

## 2023-10-21 DIAGNOSIS — D631 Anemia in chronic kidney disease: Secondary | ICD-10-CM | POA: Insufficient documentation

## 2023-10-21 DIAGNOSIS — I129 Hypertensive chronic kidney disease with stage 1 through stage 4 chronic kidney disease, or unspecified chronic kidney disease: Secondary | ICD-10-CM | POA: Insufficient documentation

## 2023-10-21 DIAGNOSIS — N184 Chronic kidney disease, stage 4 (severe): Secondary | ICD-10-CM | POA: Insufficient documentation

## 2023-10-21 LAB — HEMOGLOBIN AND HEMATOCRIT (CANCER CENTER ONLY)
HCT: 26.7 % — ABNORMAL LOW (ref 39.0–52.0)
Hemoglobin: 8.6 g/dL — ABNORMAL LOW (ref 13.0–17.0)

## 2023-10-21 MED ORDER — DARBEPOETIN ALFA 60 MCG/0.3ML IJ SOSY
60.0000 ug | PREFILLED_SYRINGE | INTRAMUSCULAR | Status: DC
  Filled 2023-10-21: qty 0.3

## 2023-10-21 MED ORDER — DARBEPOETIN ALFA 60 MCG/0.3ML IJ SOSY: 60.0000 ug | PREFILLED_SYRINGE | INTRAMUSCULAR | Status: DC

## 2023-10-21 MED ORDER — DARBEPOETIN ALFA 40 MCG/0.4ML IJ SOSY
60.0000 ug | PREFILLED_SYRINGE | Freq: Once | INTRAMUSCULAR | Status: AC
Start: 2023-10-21 — End: 2023-10-21
  Administered 2023-10-21: 60 ug via SUBCUTANEOUS
  Filled 2023-10-21: qty 0.6

## 2023-10-23 ENCOUNTER — Encounter: Payer: Self-pay | Admitting: Oncology

## 2023-10-26 ENCOUNTER — Encounter: Payer: Self-pay | Admitting: *Deleted

## 2023-10-27 ENCOUNTER — Encounter: Payer: Self-pay | Admitting: Medical

## 2023-10-27 ENCOUNTER — Ambulatory Visit: Payer: Medicare HMO | Attending: Medical | Admitting: Medical

## 2023-10-27 DIAGNOSIS — I471 Supraventricular tachycardia, unspecified: Secondary | ICD-10-CM | POA: Diagnosis not present

## 2023-10-27 DIAGNOSIS — I25118 Atherosclerotic heart disease of native coronary artery with other forms of angina pectoris: Secondary | ICD-10-CM | POA: Diagnosis not present

## 2023-10-27 DIAGNOSIS — N189 Chronic kidney disease, unspecified: Secondary | ICD-10-CM

## 2023-10-27 DIAGNOSIS — I10 Essential (primary) hypertension: Secondary | ICD-10-CM | POA: Diagnosis not present

## 2023-10-27 DIAGNOSIS — N184 Chronic kidney disease, stage 4 (severe): Secondary | ICD-10-CM

## 2023-10-27 DIAGNOSIS — D631 Anemia in chronic kidney disease: Secondary | ICD-10-CM

## 2023-10-27 DIAGNOSIS — Z952 Presence of prosthetic heart valve: Secondary | ICD-10-CM

## 2023-10-27 NOTE — Patient Instructions (Addendum)
Medication Instructions:  Your Physician recommend you continue on your current medication as directed.    *If you need a refill on your cardiac medications before your next appointment, please call your pharmacy*  Lab Work: None ordered at this time   Follow-Up: At [facility], you and your health needs are our priority.  As part of our continuing mission to provide you with exceptional heart care, we have created designated Provider Care Teams.  These Care Teams include your primary Cardiologist (physician) and Advanced Practice Providers (APPs -  Physician Assistants and Nurse Practitioners) who all work together to provide you with the care you need, when you need it.  We recommend signing up for the patient portal called "MyChart".  Sign up information is provided on this After Visit Summary.  MyChart is used to connect with patients for Virtual Visits (Telemedicine).  Patients are able to view lab/test results, encounter notes, upcoming appointments, etc.  Non-urgent messages can be sent to your provider as well.   To learn more about what you can do with MyChart, go to [email or web address].    Your next appointment:   3 month(s)  Provider:   You may see Yvonne Kendall, MD or one of the following Advanced Practice Providers on your designated Care Team:   Cadence [place], [place]

## 2023-10-27 NOTE — Progress Notes (Signed)
Cardiology Office Note:    Date:  10/27/2023   ID:  Rodney Wiley, DOB 02/21/1936, MRN [id number]  PCP:  Armando Gang, FNP  [facility] Cardiologist:  Yvonne Kendall, MD  [facility] Electrophysiologist:  None   Referring MD: Armando Gang, FNP   Chief Complaint: Hospital follow-up  History of Present Illness:    Rodney Wiley is a 87 y.o. male with a hx of resistant hypertension, CAD status post CABG in 02/2018, aortic valve regurgitation and thoracic aortic aneurysm status post Bentall with bioprosthetic aortic valve replacement in 02/2018, SVT and frequent PACs, chronic anemia/iron def anemia, HFpEF, and CKD stage 4 who is being seen for hospital follow-up.  The patient underwent CABG and bioprosthetic AVR in 02/2018 with LIMA to LAD, SVG to OM2 and SVG to PDA. 24mm synthetic aortic root graft and 21mm Edwards Inspiris Roselia stented bovine pericardial tissue valve was used. EF shortly after was 50-55%, G2DD. Heart monitor 2020 showed NSR, PACs, frequent PVCs and runs of atrial tachycardia, PVC burden 8.4%. The patient was started on amiodarone. BB was stopped due to 1st degree AV block. Echo 02/2023 showed normal LVEF 55-60%, mild LVH, G1DD, normal mitral valve, aortic valve mean gradient 18.17mmHg.  The patient presented to the ER on 07/26/2023 with weakness, lightheadedness, elevated blood pressures, nausea and diaphoresis for the last few weeks.  High-sensitivity troponin elevated to 26.  EKG was nonacute.  Echo showed normal LV function, grade 2 diastolic dysfunction, moderate MR. Telemetry was unremarkable. Blood  Pressure medications were adjusted.  He was admitted at [facility] in September 2024 with hyponatremia secondary to SIADH, dyspnea, dysphagia with severe GERD (nausea and vomiting), normocytic anemia. He underwent extensive work-up to r/o underlying malignancy resulting in SIADGE. Barium swallow showed severe GERD and esophageal dysmotility, PPI was increased.    Today, the the patient has been OK. Still has some weakness and low energy. He hs been eating and drinking normally. He denies chest pain, fever or chills. He has SOB that is improving. He denies lower leg edema. He takes Torsemide as needed. BP at home is normally good. He has occasional lightheadedness and dizziness.   Past Medical History:  Diagnosis Date   Anemia    Aortic insufficiency    a. 02/2018 s/p bioprosthetic AVR; 04/2018 Echo: EF 50-55%, Gr2 DD, Ao bioprosthesis, mean grad , Ao root/Asc Ao nl in size, mild MR, mildly dil LA, nl RV fxn. Nl PASP.   Arthropathy    Ascending aortic aneurysm (HCC)    a. 12/2016 MRA: 4.3cm @ sinus of valsalva, 4.9cm above sinotubular jxn; b. 02/2018 s/p biological Bentall and AVR.   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 02/2018 Cath: LAD 20ost, 40p, 71m, LCX 60p, OM2 60, OM3 85, RCA 10p/m w/ L->R and R->R collats;  b. 02/2018 CABG x 3: LIMA to LAD, SVG to OM2, SVG to PDA, EVH via right thigh.   CKD (chronic kidney disease), stage III (HCC)    Diverticulitis    Essential hypertension    GERD (gastroesophageal reflux disease)    Hemorrhoids    History of chicken pox    History of Helicobacter pylori infection 06/2007   Hyperlipidemia    Hypertension    Left renal artery stenosis (HCC)    a. 01/2017 RA u/s: moderate L RAS.   Lower extremity edema    Nonrheumatic mitral (valve) insufficiency    a. 12/2016 Echo: mild to  mod MR; b. 09/2017 TEE: mild to mod MR; c. 04/2018 Echo: Mild MR.   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 s/p 21 mm Edwards Inspiris Resilia stented bovine pericardial tissue valve and 24 mm Gelweave Valsalva synthetic root conduit with reimplantation of left main coronary artery.    Past Surgical History:  Procedure Laterality Date   AORTIC VALVE REPAIR N/A 02/22/2018    Procedure: AORTIC VALVE REPLACEMENT -Biological Bentall aortic root replacement,;  Surgeon: Purcell Nails, MD;  Location: [facility] OR;  Service: Open Heart Surgery;  Laterality: N/A;   bypass surgery     CARDIAC CATHETERIZATION     01/16/2018   COLONOSCOPY  2011   by Dr. Bluford Kaufmann with findings of diverticulosis   CORONARY ARTERY BYPASS GRAFT N/A 02/22/2018   Procedure: CORONARY ARTERY BYPASS GRAFTING (CABG) x three, using left internal mammary artery and right leg greater saphenous vein harvested endoscopically;  Surgeon: Purcell Nails, MD;  Location: [facility] OR;  Service: Open Heart Surgery;  Laterality: N/A;   CORONARY/GRAFT ANGIOGRAPHY N/A 01/16/2018   Procedure: CORONARY/GRAFT ANGIOGRAPHY;  Surgeon: Yvonne Kendall, MD;  Location: [facility] INVASIVE CV LAB;  Service: Cardiovascular;  Laterality: N/A;   ELBOW SURGERY Left    hemorhoidectomy     RIGHT HEART CATH N/A 01/16/2018   Procedure: RIGHT HEART CATH;  Surgeon: Yvonne Kendall, MD;  Location: [facility] INVASIVE CV LAB;  Service: Cardiovascular;  Laterality: N/A;   SHOULDER ARTHROSCOPY WITH ROTATOR CUFF REPAIR Right    SHOULDER SURGERY Right    TEE WITHOUT CARDIOVERSION N/A 09/27/2017   Procedure: TRANSESOPHAGEAL ECHOCARDIOGRAM (TEE);  Surgeon: Lars Masson, MD;  Location: [facility] ENDOSCOPY;  Service: Cardiovascular;  Laterality: N/A;   TEE WITHOUT CARDIOVERSION N/A 02/22/2018   Procedure: TRANSESOPHAGEAL ECHOCARDIOGRAM (TEE);  Surgeon: Purcell Nails, MD;  Location: [facility] OR;  Service: Open Heart Surgery;  Laterality: N/A;   THORACIC AORTIC ANEURYSM REPAIR N/A 02/22/2018   Procedure: THORACIC ASCENDING ANEURYSM REPAIR (AAA) Resection of ascending aorta aneurysm;  Surgeon: Purcell Nails, MD;  Location: [facility] OR;  Service: Open Heart Surgery;  Laterality: N/A;    Current Medications: Current Meds  Medication Sig   amLODipine (NORVASC) 10 MG tablet Take 5 mg by mouth daily.   aspirin EC 81 MG tablet Take 81 mg by mouth daily.   atorvastatin (LIPITOR) 40 MG  tablet TAKE 1 TABLET BY MOUTH EVERY DAY   cholecalciferol (VITAMIN D3) 25 MCG (1000 UT) tablet Take 1,000 Units by mouth daily.   enalapril (VASOTEC) 10 MG tablet Take 10 mg by mouth daily.   hydrALAZINE (APRESOLINE) 50 MG tablet Take 50 mg by mouth 2 (two) times daily.   isosorbide mononitrate (IMDUR) 60 MG 24 hr tablet TAKE 1 TABLET BY MOUTH EVERY DAY   nitroGLYCERIN (NITROSTAT) 0.4 MG SL tablet Place 1 tablet (0.4 mg total) under the tongue every 5 (five) minutes as needed for chest pain. Maximum of 3 doses.   ondansetron (ZOFRAN) 4 MG tablet Take 1 tablet (4 mg total) by mouth every 6 (six) hours as needed for nausea.   pantoprazole (PROTONIX) 40 MG tablet Take 1 tablet (40 mg total) by mouth daily.   torsemide (DEMADEX) 10 MG tablet TAKE 1 TABLET BY MOUTH EVERY DAY AS NEEDED     Allergies:   Patient has no known allergies.   Social History   Socioeconomic History   Marital status: Widowed  Spouse name: Not on file   Number of children: Not on file   Years of education: Not on file   Highest education level: Not on file  Occupational History   Not on file  Tobacco Use   Smoking status: Never   Smokeless tobacco: Never  Vaping Use   Vaping status: Never Used  Substance and Sexual Activity   Alcohol use: No   Drug use: No   Sexual activity: Not Currently  Other Topics Concern   Not on file  Social History Narrative   Not on file   Social Determinants of Health   Financial Resource Strain: Patient Declined (09/01/2023)   Received [facility]   Overall Financial Resource Strain (CARDIA)    Difficulty of Paying Living Expenses: Patient declined  Food Insecurity: No Food Insecurity (09/01/2023)   [facility] from [facility]   Hunger Vital Sign    Worried About Running Out of Food in the Last Year: Never true    Ran Out of Food in the Last Year: Never true  Transportation Needs: No Transportation Needs (09/01/2023)   Received from [facility]   PRAPARE -  Transportation    Lack of Transportation (Medical): No    Lack of Transportation (Non-Medical): No  Physical Activity: Not on file  Stress: Not on file  Social Connections: Not on file     Family History: The patient's family history includes Colon cancer in his brother; Heart Problems (age of onset: 25) in his son and son; Heart attack in his paternal grandmother; Hypertension in his mother; Kidney failure in his brother; Leukemia in his father; Prostate cancer in his brother; Stroke in his mother, paternal grandfather, and sister; Uterine cancer in his sister.  ROS:   Please see the history of present illness.     All other systems reviewed and are negative.  EKGs/Labs/Other Studies Reviewed:    The following studies were reviewed today:  Echo 07/2023 1. Left ventricular ejection fraction, by estimation, is 55 to 60%. The  left ventricle has normal function. The left ventricle has no regional  wall motion abnormalities. There is moderate left ventricular hypertrophy.  Left ventricular diastolic  parameters are consistent with Grade II diastolic dysfunction  (pseudonormalization).   2. Right ventricular systolic function is normal. The right ventricular  size is normal.   3. Left atrial size was mildly dilated.   4. The mitral valve is normal in structure. Moderate mitral valve  regurgitation. No evidence of mitral stenosis.   5. The aortic valve is normal in structure. Aortic valve regurgitation is  not visualized. Mild aortic valve stenosis. Aortic valve mean gradient  measures 16.4 mmHg. Aortic valve Vmax measures 2.71 m/s.   6. The inferior vena cava is normal in size with greater than 50%  respiratory variability, suggesting right atrial pressure of 3 mmHg.   Echo 02/2023 1. Left ventricular ejection fraction, by estimation, is 55 to 60%. The  left ventricle has normal function. The left ventricle demonstrates  regional wall motion abnormalities (septal wall hypokinesis  possibly  secondary to post-operative state). There is   mild left ventricular hypertrophy. Left ventricular diastolic parameters  are consistent with Grade I diastolic dysfunction (impaired relaxation).  The average left ventricular global longitudinal strain is -12.6 %.   2. Right ventricular systolic function is normal. The right ventricular  size is normal. Tricuspid regurgitation signal is inadequate for assessing  PA pressure.   3. Left atrial size was moderately  dilated.   4. The mitral valve is normal in structure. No evidence of mitral valve  regurgitation. No evidence of mitral stenosis.   5. The aortic valve has been repaired/replaced. Aortic valve  regurgitation is not visualized. No aortic stenosis is present. There is a  Edwards bioprosthetic valve present in the aortic position. Procedure  Date: 02/2018. Aortic valve mean gradient  measures 18.7 mmHg. Aortic valve Vmax measures 2.95 m/s.   6. The inferior vena cava is normal in size with greater than 50%  respiratory variability, suggesting right atrial pressure of 3 mmHg.   EKG:  EKG is ordered today.  The ekg ordered today demonstrates NSR, 1st degree AV block, LAD, ant/inf q waves, no changes  Recent Labs: 11/24/2022: TSH 4.469 08/23/2023: ALT 14; B Natriuretic Peptide 91.6 09/23/2023: Platelet Count 199 09/29/2023: BUN 40; Creatinine 2.24; Potassium 4.6; Sodium 137 10/21/2023: Hemoglobin 8.6  Recent Lipid Panel    Component Value Date/Time   CHOL 127 02/15/2022 0837   TRIG 148 02/15/2022 0837   HDL 38 (L) 02/15/2022 0837   CHOLHDL 3.3 02/15/2022 0837   CHOLHDL 3.8 03/11/2017 0753   VLDL 39 03/11/2017 0753   LDLCALC 63 02/15/2022 0837    Physical Exam:    VS:  BP 136/76 (BP Location: Left Arm, Patient Position: Sitting, Cuff Size: Normal)   Pulse 80   Ht 5\' 7"  (1.702 m)   Wt 148 lb 12.8 oz (67.5 kg)   SpO2 98%   BMI 23.31 kg/m     Wt Readings from Last 3 Encounters:  10/27/23 148 lb 12.8 oz (67.5 kg)   09/23/23 154 lb (69.9 kg)  08/23/23 160 lb 11.5 oz (72.9 kg)     GEN:  Well nourished, well developed in no acute distress HEENT: Normal NECK: No JVD; No carotid bruits LYMPHATICS: No lymphadenopathy CARDIAC: RRR, + murmur, no rubs, gallops RESPIRATORY:  Clear to auscultation without rales, wheezing or rhonchi  ABDOMEN: Soft, non-tender, non-distended MUSCULOSKELETAL:  No edema; No deformity  SKIN: Warm and dry NEUROLOGIC:  Alert and oriented x 3 PSYCHIATRIC:  Normal affect   ASSESSMENT:    1. Essential hypertension   2. Coronary artery disease of native artery of native heart with stable angina pectoris (HCC)   3. Anemia in chronic kidney disease, unspecified CKD stage   4. PSVT (paroxysmal supraventricular tachycardia) (HCC)   5. S/P AVR   6. Chronic kidney disease, stage IV (severe) (HCC)    PLAN:    In order of problems listed above:  HTN BP today is good at 136/76. Continue amlodipine 10mg  daily, Imdur 60mg  daily, enalapril 10mg  daily. Reports he is off Cardura. He will continue to monitor BP at home.   CAD s/p CABG in 2019 He denies chest pain, but has chronic SOB that is improving (chronic anemia is likely contributing). Echo 07/2023 showed normal LVEF with no WMA. Continue Aspirin 81mg  daily, Lipitor 40mg  daily, Imdur 60mg  daily, SL NTG. No further ischemic work-up at this time.  Normocytic anemia Hgb low but stable, 8-9. He undergoes Iron infusions as outpatient, followed by oncology.  S/p AVR HFpEF Recent echo showed stable gradient of , unchanged since 2022. It also showed moderate MR. He takes Torsemide as needed for swelling.  pSVT He denies palpitations. Continue amiodarone 100mg  daily.   CKD stage 3-4 Scr stable at 2.39. Followed by outpatient nephrology.  Disposition: Follow up in 3 month(s) with MD/APP    Signed, Jadan Hinojos David Stall, PA-C  10/27/2023 10:32 AM  [facility]

## 2023-11-10 ENCOUNTER — Other Ambulatory Visit: Payer: Self-pay

## 2023-11-10 DIAGNOSIS — D509 Iron deficiency anemia, unspecified: Secondary | ICD-10-CM

## 2023-11-11 ENCOUNTER — Inpatient Hospital Stay: Payer: Medicare HMO

## 2023-11-11 ENCOUNTER — Inpatient Hospital Stay: Payer: Medicare HMO | Attending: Oncology

## 2023-11-11 VITALS — BP 126/81 | HR 85

## 2023-11-11 DIAGNOSIS — N184 Chronic kidney disease, stage 4 (severe): Secondary | ICD-10-CM | POA: Insufficient documentation

## 2023-11-11 DIAGNOSIS — D631 Anemia in chronic kidney disease: Secondary | ICD-10-CM | POA: Diagnosis not present

## 2023-11-11 DIAGNOSIS — I129 Hypertensive chronic kidney disease with stage 1 through stage 4 chronic kidney disease, or unspecified chronic kidney disease: Secondary | ICD-10-CM | POA: Diagnosis present

## 2023-11-11 DIAGNOSIS — D509 Iron deficiency anemia, unspecified: Secondary | ICD-10-CM

## 2023-11-11 LAB — HEMOGLOBIN AND HEMATOCRIT (CANCER CENTER ONLY)
HCT: 30.4 % — ABNORMAL LOW (ref 39.0–52.0)
Hemoglobin: 9.8 g/dL — ABNORMAL LOW (ref 13.0–17.0)

## 2023-11-11 MED ORDER — DARBEPOETIN ALFA 40 MCG/0.4ML IJ SOSY
60.0000 ug | PREFILLED_SYRINGE | Freq: Once | INTRAMUSCULAR | Status: AC
Start: 2023-11-11 — End: 2023-11-11
  Administered 2023-11-11: 60 ug via SUBCUTANEOUS
  Filled 2023-11-11: qty 0.6

## 2023-11-11 MED ORDER — DARBEPOETIN ALFA 60 MCG/0.3ML IJ SOSY
60.0000 ug | PREFILLED_SYRINGE | INTRAMUSCULAR | Status: DC
Start: 2023-11-11 — End: 2023-11-11
  Filled 2023-11-11: qty 0.3

## 2023-11-23 ENCOUNTER — Ambulatory Visit: Payer: Medicare HMO | Admitting: Internal Medicine

## 2023-11-29 ENCOUNTER — Other Ambulatory Visit: Payer: Self-pay | Admitting: *Deleted

## 2023-11-29 DIAGNOSIS — D509 Iron deficiency anemia, unspecified: Secondary | ICD-10-CM

## 2023-11-29 DIAGNOSIS — D631 Anemia in chronic kidney disease: Secondary | ICD-10-CM

## 2023-11-29 DIAGNOSIS — N183 Chronic kidney disease, stage 3 unspecified: Secondary | ICD-10-CM

## 2023-12-02 ENCOUNTER — Inpatient Hospital Stay: Payer: Medicare HMO

## 2023-12-02 VITALS — BP 152/73 | HR 63

## 2023-12-02 DIAGNOSIS — D631 Anemia in chronic kidney disease: Secondary | ICD-10-CM

## 2023-12-02 DIAGNOSIS — I129 Hypertensive chronic kidney disease with stage 1 through stage 4 chronic kidney disease, or unspecified chronic kidney disease: Secondary | ICD-10-CM | POA: Diagnosis not present

## 2023-12-02 LAB — HEMOGLOBIN AND HEMATOCRIT (CANCER CENTER ONLY)
HCT: 30.9 % — ABNORMAL LOW (ref 39.0–52.0)
Hemoglobin: 9.8 g/dL — ABNORMAL LOW (ref 13.0–17.0)

## 2023-12-02 MED ORDER — DARBEPOETIN ALFA 40 MCG/0.4ML IJ SOSY
60.0000 ug | PREFILLED_SYRINGE | INTRAMUSCULAR | Status: DC
  Administered 2023-12-02: 60 ug via SUBCUTANEOUS
  Filled 2023-12-02: qty 0.6

## 2023-12-14 ENCOUNTER — Other Ambulatory Visit: Payer: Self-pay | Admitting: Internal Medicine

## 2023-12-14 DIAGNOSIS — I440 Atrioventricular block, first degree: Secondary | ICD-10-CM

## 2023-12-14 DIAGNOSIS — I491 Atrial premature depolarization: Secondary | ICD-10-CM

## 2023-12-14 DIAGNOSIS — I471 Supraventricular tachycardia, unspecified: Secondary | ICD-10-CM

## 2023-12-15 ENCOUNTER — Encounter: Payer: Self-pay | Admitting: Oncology

## 2023-12-23 ENCOUNTER — Inpatient Hospital Stay: Payer: Medicare Other

## 2023-12-23 ENCOUNTER — Inpatient Hospital Stay: Payer: Medicare Other | Admitting: Oncology

## 2023-12-25 ENCOUNTER — Encounter: Payer: Self-pay | Admitting: Oncology

## 2023-12-26 ENCOUNTER — Other Ambulatory Visit: Payer: Self-pay | Admitting: *Deleted

## 2023-12-27 ENCOUNTER — Encounter: Payer: Self-pay | Admitting: Oncology

## 2023-12-27 ENCOUNTER — Ambulatory Visit: Payer: Medicare Other | Admitting: Urology

## 2023-12-27 ENCOUNTER — Encounter: Payer: Self-pay | Admitting: Urology

## 2023-12-27 ENCOUNTER — Inpatient Hospital Stay: Payer: Medicare Other

## 2023-12-27 ENCOUNTER — Inpatient Hospital Stay: Payer: Medicare Other | Admitting: Oncology

## 2023-12-27 VITALS — BP 102/64 | HR 80

## 2023-12-27 DIAGNOSIS — R972 Elevated prostate specific antigen [PSA]: Secondary | ICD-10-CM | POA: Diagnosis not present

## 2023-12-27 DIAGNOSIS — R339 Retention of urine, unspecified: Secondary | ICD-10-CM | POA: Diagnosis not present

## 2023-12-27 DIAGNOSIS — R3989 Other symptoms and signs involving the genitourinary system: Secondary | ICD-10-CM

## 2023-12-27 DIAGNOSIS — R39198 Other difficulties with micturition: Secondary | ICD-10-CM

## 2023-12-27 DIAGNOSIS — N401 Enlarged prostate with lower urinary tract symptoms: Secondary | ICD-10-CM | POA: Diagnosis not present

## 2023-12-27 LAB — MICROSCOPIC EXAMINATION

## 2023-12-27 LAB — URINALYSIS, COMPLETE
Bilirubin, UA: NEGATIVE
Glucose, UA: NEGATIVE
Ketones, UA: NEGATIVE
Nitrite, UA: NEGATIVE
RBC, UA: NEGATIVE
Specific Gravity, UA: 1.015 (ref 1.005–1.030)
Urobilinogen, Ur: 0.2 mg/dL (ref 0.2–1.0)
pH, UA: 5 (ref 5.0–7.5)

## 2023-12-27 LAB — BLADDER SCAN AMB NON-IMAGING: Scan Result: 1291

## 2023-12-27 MED ORDER — CEFUROXIME AXETIL 250 MG PO TABS: 250.0000 mg | ORAL_TABLET | Freq: Two times a day (BID) | ORAL | 0 refills | Status: AC

## 2023-12-27 NOTE — Patient Instructions (Signed)

## 2023-12-27 NOTE — Progress Notes (Addendum)
12/27/2023 4:03 PM   Cam Dauphin Wagster June 28, 1936 [id number]  Referring provider: Armando Gang, FNP [address] [place],  [place] [address]  Urological history: 1. Elevated PSA -PSA (01/2023) 27.4 -prostate MRI (03/2023) negative   2. BPH with LU TS -prostate volume (MRI 2024) - 50 cc   3. Renal cysts  -non contrast CT (08/2023) - Kidneys show bilateral simple appearing cysts.  Chief Complaint  Patient presents with   Follow-up   HPI: Rodney Wiley is a 88 y.o. male who presents today for weight loss and urinating very little with his daughter-in-law, [name].    Previous records reviewed.   He states for the last week he has been having issues with constipation, unable to have a bowel movement and urinary dribbling.  He states he was able to have a good bowel this morning, but he is still just having dribbling.  He has been having some dry heaves.  Patient denies any modifying or aggravating factors.  Patient denies any recent UTI's, gross hematuria, dysuria or suprapubic/flank pain.  Patient denies any fevers, chills or vomiting.    UA amber clear, specific gravity 1.015, pH 5.0, 2+ protein, 1+ leukocyte, 11-30 WBCs, hyaline cast present, mucus threads present and moderate bacteria.    PVR 1291 mL   Signed he states for the last 2 weeks he has been having alternating constipation and diarrhea.  PMH: Past Medical History:  Diagnosis Date   Anemia    Aortic insufficiency    a. 02/2018 s/p bioprosthetic AVR; 04/2018 Echo: EF 50-55%, Gr2 DD, Ao bioprosthesis, mean grad , Ao root/Asc Ao nl in size, mild MR, mildly dil LA, nl RV fxn. Nl PASP.   Arthropathy    Ascending aortic aneurysm (HCC)    a. 12/2016 MRA: 4.3cm @ sinus of valsalva, 4.9cm above sinotubular jxn; b. 02/2018 s/p biological Bentall and AVR.   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 02/2018 Cath: LAD 20ost, 40p, 77m, LCX 60p, OM2 60, OM3 85, RCA 10p/m w/ L->R and  R->R collats;  b. 02/2018 CABG x 3: LIMA to LAD, SVG to OM2, SVG to PDA, EVH via right thigh.   CKD (chronic kidney disease), stage III (HCC)    Diverticulitis    Essential hypertension    GERD (gastroesophageal reflux disease)    Hemorrhoids    History of chicken pox    History of Helicobacter pylori infection 06/2007   Hyperlipidemia    Hypertension    Left renal artery stenosis (HCC)    a. 01/2017 RA u/s: moderate L RAS.   Lower extremity edema    Nonrheumatic mitral (valve) insufficiency    a. 12/2016 Echo: mild to mod MR; b. 09/2017 TEE: mild to mod MR; c. 04/2018 Echo: Mild MR.   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 s/p 21 mm Edwards Inspiris Resilia stented bovine pericardial tissue valve and 24 mm Gelweave Valsalva synthetic root conduit with reimplantation of left main coronary artery.    Surgical History: Past Surgical History:  Procedure Laterality Date   AORTIC VALVE REPAIR N/A 02/22/2018   Procedure: AORTIC VALVE REPLACEMENT -Biological Bentall aortic root replacement,;  Surgeon: Purcell Nails, MD;  Location: [facility] OR;  Service: Open Heart Surgery;  Laterality: N/A;   bypass surgery     CARDIAC CATHETERIZATION     01/16/2018  COLONOSCOPY  2011   by Dr. Bluford Kaufmann with findings of diverticulosis   CORONARY ARTERY BYPASS GRAFT N/A 02/22/2018   Procedure: CORONARY ARTERY BYPASS GRAFTING (CABG) x three, using left internal mammary artery and right leg greater saphenous vein harvested endoscopically;  Surgeon: Purcell Nails, MD;  Location: [facility] OR;  Service: Open Heart Surgery;  Laterality: N/A;   CORONARY/GRAFT ANGIOGRAPHY N/A 01/16/2018   Procedure: CORONARY/GRAFT ANGIOGRAPHY;  Surgeon: Yvonne Kendall, MD;  [facility]: [facility];  Service: Cardiovascular;  Laterality: N/A;   ELBOW SURGERY Left    hemorhoidectomy     RIGHT HEART CATH N/A 01/16/2018    Procedure: RIGHT HEART CATH;  Surgeon: Yvonne Kendall, MD;  Location: [facility];  Service: Cardiovascular;  Laterality: N/A;   SHOULDER ARTHROSCOPY WITH ROTATOR CUFF REPAIR Right    SHOULDER SURGERY Right    TEE WITHOUT CARDIOVERSION N/A 09/27/2017   Procedure: TRANSESOPHAGEAL ECHOCARDIOGRAM (TEE);  Surgeon: Lars Masson, MD;  Location: [facility] ENDOSCOPY;  Service: Cardiovascular;  Laterality: N/A;   TEE WITHOUT CARDIOVERSION N/A 02/22/2018   Procedure: TRANSESOPHAGEAL ECHOCARDIOGRAM (TEE);  Surgeon: Purcell Nails, MD;  Location: [facility] OR;  Service: Open Heart Surgery;  Laterality: N/A;   THORACIC AORTIC ANEURYSM REPAIR N/A 02/22/2018   Procedure: THORACIC ASCENDING ANEURYSM REPAIR (AAA) Resection of ascending aorta aneurysm;  Surgeon: Purcell Nails, MD;  Location:  [facility] OR;  Service: Open Heart Surgery;  Laterality: N/A;    Home Medications:  Allergies as of 12/27/2023   No Known Allergies      Medication List        Accurate as of December 27, 2023  4:03 PM. If you have any questions, ask your nurse or doctor.          amiodarone 100 MG tablet Commonly known as: PACERONE TAKE 1 TABLET BY MOUTH EVERY DAY   amLODipine 10 MG tablet Commonly known as: NORVASC Take 5 mg by mouth daily.   aspirin EC 81 MG tablet Take 81 mg by mouth daily.   atorvastatin 40 MG tablet Commonly known as: LIPITOR TAKE 1 TABLET BY MOUTH EVERY DAY   cefUROXime 250 MG tablet Commonly known as: CEFTIN Take 1 tablet (250 mg total) by mouth 2 (two) times daily with a meal for 5 days. Started by: Michiel Cowboy   cholecalciferol 25 MCG (1000 UNIT) tablet Commonly known as: VITAMIN D3 Take 1,000 Units by mouth daily.   enalapril 10 MG tablet Commonly known as: VASOTEC Take 10 mg by mouth daily.   gabapentin 100 MG capsule Commonly known as: NEURONTIN Take 100 mg by mouth at bedtime.   hydrALAZINE 50 MG tablet Commonly known as: APRESOLINE Take 50 mg by mouth 2 (two) times  daily.   isosorbide mononitrate 60 MG 24 hr tablet Commonly known as: IMDUR TAKE 1 TABLET BY MOUTH EVERY DAY   levothyroxine 25 MCG tablet Commonly known as: SYNTHROID Take 25 mcg by mouth every morning.   nitroGLYCERIN 0.4 MG SL tablet Commonly known as: Nitrostat Place 1 tablet (0.4 mg total) under the tongue every 5 (five) minutes as needed for chest pain. Maximum of 3 doses.   ondansetron 4 MG tablet Commonly known as: ZOFRAN Take 1 tablet (4 mg total) by mouth every 6 (six) hours as needed for nausea.   oxyCODONE 5 MG immediate release tablet Commonly known as: Oxy IR/ROXICODONE Take 5 mg by mouth every 6 (six) hours as needed.   pantoprazole 40 MG tablet Commonly known as: Protonix Take 1  tablet (40 mg total) by mouth daily.   torsemide 10 MG tablet Commonly known as: DEMADEX TAKE 1 TABLET BY MOUTH EVERY DAY AS NEEDED   traMADol 50 MG tablet Commonly known as: ULTRAM Take 50 mg by mouth every 6 (six) hours as needed.        Allergies: No Known Allergies  Family History: Family History  Problem Relation Age of Onset   Hypertension Mother    Stroke Mother    Leukemia Father    Heart attack Paternal Grandmother    Stroke Paternal Grandfather    Heart Problems Son 64   Heart Problems Son 63   Uterine cancer Sister    Prostate cancer Brother    Colon cancer Brother    Kidney failure Brother    Stroke Sister     Social History:  reports that he has never smoked. He has never used smokeless tobacco. He reports that he does not drink alcohol and does not use drugs.  ROS: Pertinent ROS in HPI  Physical Exam: BP 102/64   Pulse 80   Constitutional:  Well nourished. Alert and oriented, No acute distress. HEENT: [place] AT, moist mucus membranes.  Trachea midline, no masses. Cardiovascular: No clubbing, cyanosis, or edema. Respiratory: Normal respiratory effort, no increased work of breathing. GU: No CVA tenderness.  Bladder palpable just below the umbilicus.   Patient with uncircumcised phallus.  Urethral meatus is patent.  No penile discharge. No penile lesions or rashes. Scrotum without lesions, cysts, rashes and/or edema.   Neurologic: Grossly intact, no focal deficits, moving all 4 extremities. Psychiatric: Normal mood and affect.  Laboratory Data: Lab Results  Component Value Date   WBC 4.3 09/23/2023   HGB 9.8 (L) 12/02/2023   HCT 30.9 (L) 12/02/2023   MCV 103.1 (H) 09/23/2023   PLT 199 09/23/2023   Lab Results  Component Value Date   CREATININE 2.24 (H) 09/29/2023   Lab Results  Component Value Date   AST 20 08/23/2023   Lab Results  Component Value Date   ALT 14 08/23/2023   Urinalysis See EPIC and HPI  I have reviewed the labs.   Pertinent Imaging:  12/27/23 15:58  Scan Result 1291 ml     Procedure: Simple Catheter Placement  Due to urinary retention patient is present today for a foley cath placement.  Patient was cleaned and prepped in a sterile fashion with betadine and 2% lidocaine jelly was instilled into the urethra. A 18 FR Coude foley catheter was inserted, urine return was noted  1500 ml, urine was amber in color.  The balloon was filled with 10cc of sterile water.  A leg bag was attached for drainage.  Patient was given instruction on proper catheter care.  Patient tolerated well, no complications were noted   Performed by: Michiel Cowboy, PA-C and Humberta Magallon-Mariche, CMA   Assessment & Plan:    1. Urinary retention -Foley catheter in place  -He will return for voiding trial in 1 week -I did not start an alpha-blocker at this time due to his weakness and soft BP -If he fails voiding trial next week, may consider if he is stronger -Urinary retention may be due to his constipation -UA w/ pyuria and bacteruria -urine culture pending -Ceftin 250 mg BID sent to pharmacy, will adjust once culture results are available if necessary -Explained to him and his daughter that he needs to drink Gatorade  along with his Sprite's to prevent any electrolyte imbalances also he may see  blood in the urine from stretch injury from the bladder and this is okay as long as its intermittent, thin and does not obstruct the catheter -Reviewed return to clinic and red flag signs  2. BPH with LU TS -Continue conservative management  3. Elevated PSA  -May consider repeating PSA if he fails voiding trial in the future   Return in about 1 week (around 01/03/2024) for TOV .  These notes generated with voice recognition software. I apologize for typographical errors.  Cloretta Ned  [facility] [address]  [address] [place], [place] [address] [phone]

## 2023-12-30 LAB — CULTURE, URINE COMPREHENSIVE

## 2024-01-02 NOTE — Progress Notes (Unsigned)
01/04/2024 4:36 PM   Ayush Boulet Sahr 1936-03-22 [id number]  Referring provider: Armando Gang, FNP [address] [place],  [place] [address]  Urological history: 1. Elevated PSA -PSA (01/2023) 27.4 -prostate MRI (03/2023) negative   2. BPH with LU TS -prostate volume (MRI 2024) - 50 cc   3. Renal cysts  -non contrast CT (08/2023) - Kidneys show bilateral simple appearing cysts.  No chief complaint on file.  HPI: ULYSEE Wiley is a 88 y.o. male who presents today for weight loss and urinating very little with his daughter, Rodney Wiley.    Previous records reviewed.   At his visit on December 27, 2023, he states for the last week he has been having issues with constipation, unable to have a bowel movement and urinary dribbling.  He states he was able to have a good bowel this morning, but he is still just having dribbling.  He has been having some dry heaves.  Patient denies any modifying or aggravating factors.  Patient denies any recent UTI's, gross hematuria, dysuria or suprapubic/flank pain.  Patient denies any fevers, chills or vomiting.  UA amber clear, specific gravity 1.015, pH 5.0, 2+ protein, 1+ leukocyte, 11-30 WBCs, hyaline cast present, mucus threads present and moderate bacteria.  Urine culture was negative.  PVR 1291 mL  He states for the last 2 weeks he has been having alternating constipation and diarrhea.  A Foley catheter was placed and he was scheduled for a voiding trial in 1 week.  PMH: Past Medical History:  Diagnosis Date   Anemia    Aortic insufficiency    a. 02/2018 s/p bioprosthetic AVR; 04/2018 Echo: EF 50-55%, Gr2 DD, Ao bioprosthesis, mean grad , Ao root/Asc Ao nl in size, mild MR, mildly dil LA, nl RV fxn. Nl PASP.   Arthropathy    Ascending aortic aneurysm (HCC)    a. 12/2016 MRA: 4.3cm @ sinus of valsalva, 4.9cm above sinotubular jxn; b. 02/2018 s/p biological Bentall and AVR.   BPH (benign prostatic hyperplasia)    CAD S/P CABG x 3  02/22/2018   a. 02/2018 Cath: LAD 20ost, 40p, 65m, LCX 60p, OM2 60, OM3 85, RCA 10p/m w/ L->R and R->R collats;  b. 02/2018 CABG x 3: LIMA to LAD, SVG to OM2, SVG to PDA, EVH via right thigh.   CKD (chronic kidney disease), stage III (HCC)    Diverticulitis    Essential hypertension    GERD (gastroesophageal reflux disease)    Hemorrhoids    History of chicken pox    History of Helicobacter pylori infection 06/2007   Hyperlipidemia    Hypertension    Left renal artery stenosis (HCC)    a. 01/2017 RA u/s: moderate L RAS.   Lower extremity edema    Nonrheumatic mitral (valve) insufficiency    a. 12/2016 Echo: mild to mod MR; b. 09/2017 TEE: mild to mod MR; c. 04/2018 Echo: Mild MR.   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 s/p 21 mm Edwards Inspiris Resilia stented bovine pericardial tissue valve and 24 mm Gelweave Valsalva synthetic root conduit with reimplantation of left main coronary artery.    Surgical History: Past Surgical History:  Procedure Laterality Date   AORTIC VALVE REPAIR N/A 02/22/2018   Procedure: AORTIC VALVE REPLACEMENT -Biological Bentall aortic root replacement,;  Surgeon: Purcell Nails, MD;  Location: [facility] OR;  Service: Open Heart Surgery;  Laterality: N/A;   bypass surgery     CARDIAC CATHETERIZATION     01/16/2018   COLONOSCOPY  2011   by Dr. Bluford Kaufmann with findings of diverticulosis   CORONARY ARTERY BYPASS GRAFT N/A 02/22/2018   Procedure: CORONARY ARTERY BYPASS GRAFTING (CABG) x three, using left internal mammary artery and right leg greater saphenous vein harvested endoscopically;  Surgeon: Purcell Nails, MD;  Location: [facility] OR;  Service: Open Heart Surgery;  Laterality: N/A;   CORONARY/GRAFT ANGIOGRAPHY N/A 01/16/2018   Procedure: CORONARY/GRAFT ANGIOGRAPHY;  Surgeon: Yvonne Kendall, MD;  Location: [facility] LAB;  Service: Cardiovascular;   Laterality: N/A;   ELBOW SURGERY Left    hemorhoidectomy     RIGHT HEART CATH N/A 01/16/2018   Procedure: RIGHT HEART CATH;  Surgeon: Yvonne Kendall, MD;  Location: [facility]VAS[facility] LAB;  Service: Cardiovascular;  Laterality: N/A;   SHOULDER ARTHROSCOPY WITH ROTATOR CUFF REPAIR Right    SHOULDER SURGERY Right    TEE WITHOUT CARDIOVERSION N/A 09/27/2017   Procedure: TRANSESOPHAGEAL ECHOCARDIOGRAM (TEE);  Surgeon: Lars Masson, MD;  Location: [facility] ENDOSCOPY;  Service: Cardiovascular;  Laterality: N/A;   TEE WITHOUT CARDIOVERSION N/A 02/22/2018   Procedure: TRANSESOPHAGEAL ECHOCARDIOGRAM (TEE);  Surgeon: Purcell Nails, MD;  Location: [facility] OR;  Service: Open Heart Surgery;  Laterality: N/A;   THORACIC AORTIC ANEURYSM REPAIR N/A 02/22/2018   Procedure: THORACIC ASCENDING ANEURYSM REPAIR (AAA) Resection of ascending aorta aneurysm;  Surgeon: Purcell Nails, MD;  Location: [facility] OR;  Service: Open Heart Surgery;  Laterality: N/A;    Home Medications:  Allergies as of 01/04/2024   No Known Allergies      Medication List        Accurate as of January 02, 2024  4:36 PM. If you have any questions, ask your nurse or doctor.          amiodarone 100 MG tablet Commonly known as: PACERONE TAKE 1 TABLET BY MOUTH EVERY DAY   amLODipine 10 MG tablet Commonly known as: NORVASC Take 5 mg by mouth daily.   aspirin EC 81 MG tablet Take 81 mg by mouth daily.   atorvastatin 40 MG tablet Commonly known as: LIPITOR TAKE 1 TABLET BY MOUTH EVERY DAY   cholecalciferol 25 MCG (1000 UNIT) tablet Commonly known as: VITAMIN D3 Take 1,000 Units by mouth daily.   enalapril 10 MG tablet Commonly known as: VASOTEC Take 10 mg by mouth daily.   gabapentin 100 MG capsule Commonly known as: NEURONTIN Take 100 mg by mouth at bedtime.   hydrALAZINE 50 MG tablet Commonly known as: APRESOLINE Take 50 mg by mouth 2 (two) times daily.   isosorbide mononitrate 60 MG 24 hr tablet Commonly known as:  IMDUR TAKE 1 TABLET BY MOUTH EVERY DAY   levothyroxine 25 MCG tablet Commonly known as: SYNTHROID Take 25 mcg by mouth every morning.   nitroGLYCERIN 0.4 MG SL tablet Commonly known as: Nitrostat Place 1 tablet (0.4 mg total) under the tongue every 5 (five) minutes as needed for chest pain. Maximum of 3 doses.   ondansetron 4 MG tablet Commonly known as: ZOFRAN Take 1 tablet (4 mg total) by mouth every 6 (six) hours as needed for nausea.   oxyCODONE 5 MG immediate release tablet Commonly known as: Oxy IR/ROXICODONE Take 5 mg by mouth every 6 (six) hours as needed.   pantoprazole 40 MG tablet Commonly known as: Protonix Take 1 tablet (40 mg total) by mouth daily.   torsemide 10 MG tablet  Commonly known as: DEMADEX TAKE 1 TABLET BY MOUTH EVERY DAY AS NEEDED   traMADol 50 MG tablet Commonly known as: ULTRAM Take 50 mg by mouth every 6 (six) hours as needed.        Allergies: No Known Allergies  Family History: Family History  Problem Relation Age of Onset   Hypertension Mother    Stroke Mother    Leukemia Father    Heart attack Paternal Grandmother    Stroke Paternal Grandfather    Heart Problems Son 63   Heart Problems Son 110   Uterine cancer Sister    Prostate cancer Brother    Colon cancer Brother    Kidney failure Brother    Stroke Sister     Social History:  reports that he has never smoked. He has never used smokeless tobacco. He reports that he does not drink alcohol and does not use drugs.  ROS: Pertinent ROS in HPI  Physical Exam: There were no vitals taken for this visit.  Constitutional:  Well nourished. Alert and oriented, No acute distress. HEENT: [place] AT, moist mucus membranes.  Trachea midline, no masses. Cardiovascular: No clubbing, cyanosis, or edema. Respiratory: Normal respiratory effort, no increased work of breathing. GI: Abdomen is soft, non tender, non distended, no abdominal masses. Liver and spleen not palpable.  No hernias  appreciated.  Stool sample for occult testing is not indicated.   GU: No CVA tenderness.  No bladder fullness or masses.  Patient with circumcised/uncircumcised phallus. ***Foreskin easily retracted***  Urethral meatus is patent.  No penile discharge. No penile lesions or rashes. Scrotum without lesions, cysts, rashes and/or edema.  Testicles are located scrotally bilaterally. No masses are appreciated in the testicles. Left and right epididymis are normal. Rectal: Patient with  normal sphincter tone. Anus and perineum without scarring or rashes. No rectal masses are appreciated. Prostate is approximately *** grams, *** nodules are appreciated. Seminal vesicles are normal. Skin: No rashes, bruises or suspicious lesions. Lymph: No cervical or inguinal adenopathy. Neurologic: Grossly intact, no focal deficits, moving all 4 extremities. Psychiatric: Normal mood and affect.   Laboratory Data: N/A  Pertinent Imaging: ***  Assessment & Plan:    1. Urinary retention -Foley catheter removed for voiding trial  2. BPH with LU TS -Continue conservative management  3. Elevated PSA  -May consider repeating PSA if he fails voiding trial in the future   No follow-ups on file.  These notes generated with voice recognition software. I apologize for typographical errors.  Cloretta Ned  [facility] [address]  [address] [place], [place] [address] [phone]

## 2024-01-04 ENCOUNTER — Encounter: Payer: Self-pay | Admitting: Urology

## 2024-01-04 ENCOUNTER — Ambulatory Visit (INDEPENDENT_AMBULATORY_CARE_PROVIDER_SITE_OTHER): Payer: Medicare Other | Admitting: Urology

## 2024-01-04 ENCOUNTER — Ambulatory Visit: Payer: Medicare Other | Admitting: Urology

## 2024-01-04 DIAGNOSIS — R338 Other retention of urine: Secondary | ICD-10-CM | POA: Diagnosis not present

## 2024-01-04 DIAGNOSIS — N401 Enlarged prostate with lower urinary tract symptoms: Secondary | ICD-10-CM | POA: Diagnosis not present

## 2024-01-04 DIAGNOSIS — R972 Elevated prostate specific antigen [PSA]: Secondary | ICD-10-CM

## 2024-01-04 LAB — BLADDER SCAN AMB NON-IMAGING: Scan Result: 212

## 2024-01-04 NOTE — Progress Notes (Unsigned)
01/05/2024 8:56 PM   Rodney Wiley 01/12/1936 [id number]  Referring provider: Armando Gang, FNP [address] [place],  [place] [address]  Urological history: 1. Elevated PSA -PSA (01/2023) 27.4 -prostate MRI (03/2023) negative   2. BPH with LU TS -prostate volume (MRI 2024) - 50 cc   3. Renal cysts  -non contrast CT (08/2023) - Kidneys show bilateral simple appearing cysts.  No chief complaint on file.  HPI: Rodney Wiley is a 88 y.o. male who presents today for weight loss and urinating very little with his daughter, Rodney Wiley.    Previous records reviewed.   At his visit on December 27, 2023, he states for the last week he has been having issues with constipation, unable to have a bowel movement and urinary dribbling.  He states he was able to have a good bowel this morning, but he is still just having dribbling.  He has been having some dry heaves.  Patient denies any modifying or aggravating factors.  Patient denies any recent UTI's, gross hematuria, dysuria or suprapubic/flank pain.  Patient denies any fevers, chills or vomiting.  UA amber clear, specific gravity 1.015, pH 5.0, 2+ protein, 1+ leukocyte, 11-30 WBCs, hyaline cast present, mucus threads present and moderate bacteria.  Urine culture was negative.  PVR 1291 mL  He states for the last 2 weeks he has been having alternating constipation and diarrhea.  A Foley catheter was placed and he was scheduled for a voiding trial in 1 week.  At his visit on 01/04/2024, he  drank 3 bottles of water today, but he has not voided.  He does not even feel the urge.  His bladder scan noted a possible residual of 212 mL.  He feels comfortable today.  Patient denies any modifying or aggravating factors.  Patient denies any recent UTI's, gross hematuria, dysuria or suprapubic/flank pain.  Patient denies any fevers, chills, nausea or vomiting.    PVR ***  PMH: Past Medical History:  Diagnosis Date   Anemia    Aortic  insufficiency    a. 02/2018 s/p bioprosthetic AVR; 04/2018 Echo: EF 50-55%, Gr2 DD, Ao bioprosthesis, mean grad , Ao root/Asc Ao nl in size, mild MR, mildly dil LA, nl RV fxn. Nl PASP.   Arthropathy    Ascending aortic aneurysm (HCC)    a. 12/2016 MRA: 4.3cm @ sinus of valsalva, 4.9cm above sinotubular jxn; b. 02/2018 s/p biological Bentall and AVR.   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 02/2018 Cath: LAD 20ost, 40p, 64m, LCX 60p, OM2 60, OM3 85, RCA 10p/m w/ L->R and R->R collats;  b. 02/2018 CABG x 3: LIMA to LAD, SVG to OM2, SVG to PDA, EVH via right thigh.   CKD (chronic kidney disease), stage III (HCC)    Diverticulitis    Essential hypertension    GERD (gastroesophageal reflux disease)    Hemorrhoids    History of chicken pox    History of Helicobacter pylori infection 06/2007   Hyperlipidemia    Hypertension    Left renal artery stenosis (HCC)    a. 01/2017 RA u/s: moderate L RAS.   Lower extremity edema    Nonrheumatic mitral (valve) insufficiency    a. 12/2016 Echo: mild to mod MR; b. 09/2017 TEE: mild to mod MR; c. 04/2018 Echo: Mild MR.   Nonrheumatic pulmonary valve insufficiency    S/P biological Bentall aortic root replacement with bioprosthetic valve and synthetic  root conduit 02/22/2018   a. s/p 21 mm Edwards Inspiris Resilia stented bovine pericardial tissue valve and 24 mm Gelweave Valsalva synthetic root conduit with reimplantation of left main coronary artery.    Surgical History: Past Surgical History:  Procedure Laterality Date   AORTIC VALVE REPAIR N/A 02/22/2018   Procedure: AORTIC VALVE REPLACEMENT -Biological Bentall aortic root replacement,;  Surgeon: Purcell Nails, MD;  Location: [facility] OR;  Service: Open Heart Surgery;  Laterality: N/A;   bypass surgery     CARDIAC CATHETERIZATION     01/16/2018   COLONOSCOPY  2011   by Dr. Bluford Kaufmann with findings of diverticulosis   CORONARY ARTERY BYPASS GRAFT N/A 02/22/2018   Procedure:  CORONARY ARTERY BYPASS GRAFTING (CABG) x three, using left internal mammary artery and right leg greater saphenous vein harvested endoscopically;  Surgeon: Purcell Nails, MD;  Location: [facility] OR;  Service: Open Heart Surgery;  Laterality: N/A;   CORONARY/GRAFT ANGIOGRAPHY N/A 01/16/2018   Procedure: CORONARY/GRAFT ANGIOGRAPHY;  Surgeon: Yvonne Kendall, MD;  [facility] INVASIVE CV LAB;  Service: Cardiovascular;  Laterality: N/A;   ELBOW SURGERY Left    hemorhoidectomy     RIGHT HEART CATH N/A 01/16/2018   Procedure: RIGHT HEART CATH;  Surgeon: Yvonne Kendall, MD;  [facility]: [facility] INVASIVE CV LAB;  Service: Cardiovascular;  Laterality: N/A;   SHOULDER ARTHROSCOPY WITH ROTATOR CUFF REPAIR Right    SHOULDER SURGERY Right    TEE WITHOUT CARDIOVERSION N/A 09/27/2017   Procedure: TRANSESOPHAGEAL ECHOCARDIOGRAM (TEE);  Surgeon: Lars Masson, MD;  Location: [facility] ENDOSCOPY;  Service: Cardiovascular;  Laterality: N/A;   TEE WITHOUT CARDIOVERSION N/A 02/22/2018   Procedure: TRANSESOPHAGEAL ECHOCARDIOGRAM (TEE);  Surgeon: Purcell Nails, MD;  Location: [facility] OR;  Service: Open Heart Surgery;  Laterality: N/A;   THORACIC AORTIC ANEURYSM REPAIR N/A 02/22/2018   Procedure: THORACIC ASCENDING ANEURYSM REPAIR (AAA) Resection of ascending aorta aneurysm;  Surgeon: Purcell Nails, MD;  Location: [facility] OR;  Service: Open Heart Surgery;  Laterality: N/A;    Home Medications:  Allergies as of 01/05/2024   No Known Allergies      Medication List        Accurate as of January 04, 2024  8:56 PM. If you have any questions, ask your nurse or doctor.          amiodarone 100 MG tablet Commonly known as: PACERONE TAKE 1 TABLET BY MOUTH EVERY DAY   amLODipine 10 MG tablet Commonly known as: NORVASC Take 5 mg by mouth daily.   aspirin EC 81 MG tablet Take 81 mg by mouth daily.   atorvastatin 40 MG tablet Commonly known as: LIPITOR TAKE 1 TABLET BY MOUTH EVERY DAY   cholecalciferol 25 MCG (1000 UNIT)  tablet Commonly known as: VITAMIN D3 Take 1,000 Units by mouth daily.   enalapril 10 MG tablet Commonly known as: VASOTEC Take 10 mg by mouth daily.   gabapentin 100 MG capsule Commonly known as: NEURONTIN Take 100 mg by mouth at bedtime.   hydrALAZINE 50 MG tablet Commonly known as: APRESOLINE Take 50 mg by mouth 2 (two) times daily.   isosorbide mononitrate 60 MG 24 hr tablet Commonly known as: IMDUR TAKE 1 TABLET BY MOUTH EVERY DAY   levothyroxine 25 MCG tablet Commonly known as: SYNTHROID Take 25 mcg by mouth every morning.   nitroGLYCERIN 0.4 MG SL tablet Commonly known as: Nitrostat Place 1 tablet (0.4 mg total) under the tongue every 5 (five) minutes as needed for chest pain. Maximum  of 3 doses.   ondansetron 4 MG tablet Commonly known as: ZOFRAN Take 1 tablet (4 mg total) by mouth every 6 (six) hours as needed for nausea.   oxyCODONE 5 MG immediate release tablet Commonly known as: Oxy IR/ROXICODONE Take 5 mg by mouth every 6 (six) hours as needed.   pantoprazole 40 MG tablet Commonly known as: Protonix Take 1 tablet (40 mg total) by mouth daily.   torsemide 10 MG tablet Commonly known as: DEMADEX TAKE 1 TABLET BY MOUTH EVERY DAY AS NEEDED   traMADol 50 MG tablet Commonly known as: ULTRAM Take 50 mg by mouth every 6 (six) hours as needed.        Allergies: No Known Allergies  Family History: Family History  Problem Relation Age of Onset   Hypertension Mother    Stroke Mother    Leukemia Father    Heart attack Paternal Grandmother    Stroke Paternal Grandfather    Heart Problems Son 48   Heart Problems Son 6   Uterine cancer Sister    Prostate cancer Brother    Colon cancer Brother    Kidney failure Brother    Stroke Sister     Social History:  reports that he has never smoked. He has never used smokeless tobacco. He reports that he does not drink alcohol and does not use drugs.  ROS: Pertinent ROS in HPI  Physical Exam: There were  no vitals taken for this visit.  Constitutional:  Well nourished. Alert and oriented, No acute distress. HEENT: [place] AT, moist mucus membranes.  Trachea midline, no masses. Cardiovascular: No clubbing, cyanosis, or edema. Respiratory: Normal respiratory effort, no increased work of breathing. GI: Abdomen is soft, non tender, non distended, no abdominal masses. Liver and spleen not palpable.  No hernias appreciated.  Stool sample for occult testing is not indicated.   GU: No CVA tenderness.  No bladder fullness or masses.  Patient with circumcised/uncircumcised phallus. ***Foreskin easily retracted***  Urethral meatus is patent.  No penile discharge. No penile lesions or rashes. Scrotum without lesions, cysts, rashes and/or edema.  Testicles are located scrotally bilaterally. No masses are appreciated in the testicles. Left and right epididymis are normal. Rectal: Patient with  normal sphincter tone. Anus and perineum without scarring or rashes. No rectal masses are appreciated. Prostate is approximately *** grams, *** nodules are appreciated. Seminal vesicles are normal. Skin: No rashes, bruises or suspicious lesions. Lymph: No cervical or inguinal adenopathy. Neurologic: Grossly intact, no focal deficits, moving all 4 extremities. Psychiatric: Normal mood and affect.   Laboratory Data: N/A  Pertinent Imaging: ***   Assessment & Plan:    1. Urinary retention -Foley catheter removed for voiding trial -Has not voided today, but his PVR is not high enough to indicate replacement of the Foley catheter, it is possible he may have a baseline of an elevated PVR -We will have him return at 8 AM tomorrow morning for repeat PVR and to reassess how his night went  2. BPH with LU TS -Continue conservative management  3. Elevated PSA  -May consider repeating PSA if he fails voiding trial in the future   No follow-ups on file.  These notes generated with voice recognition software. I apologize for  typographical errors.  Cloretta Ned  [facility] [address]  [address] [place], [place] [address] [phone]

## 2024-01-04 NOTE — Progress Notes (Signed)
Catheter Removal  Patient is present today for a catheter removal.  10ml of water was drained from the balloon. A 18FR foley cath was removed from the bladder, no complications were noted. Patient tolerated well.  Performed by: Randa Lynn, RMA  Follow up/ Additional notes: No follow-ups on file.

## 2024-01-05 ENCOUNTER — Encounter: Payer: Self-pay | Admitting: Urology

## 2024-01-05 ENCOUNTER — Ambulatory Visit: Payer: Medicare Other | Admitting: Urology

## 2024-01-05 VITALS — BP 180/74 | HR 64

## 2024-01-05 DIAGNOSIS — N401 Enlarged prostate with lower urinary tract symptoms: Secondary | ICD-10-CM | POA: Diagnosis not present

## 2024-01-05 DIAGNOSIS — R338 Other retention of urine: Secondary | ICD-10-CM | POA: Diagnosis not present

## 2024-01-05 DIAGNOSIS — R972 Elevated prostate specific antigen [PSA]: Secondary | ICD-10-CM | POA: Diagnosis not present

## 2024-01-05 DIAGNOSIS — N138 Other obstructive and reflux uropathy: Secondary | ICD-10-CM

## 2024-01-05 LAB — BLADDER SCAN AMB NON-IMAGING: Scan Result: 1200

## 2024-01-05 MED ORDER — TAMSULOSIN HCL 0.4 MG PO CAPS: 0.4000 mg | ORAL_CAPSULE | Freq: Every day | ORAL | 11 refills | Status: AC

## 2024-01-05 NOTE — Patient Instructions (Signed)

## 2024-01-13 ENCOUNTER — Other Ambulatory Visit: Payer: Self-pay | Admitting: Internal Medicine

## 2024-01-18 ENCOUNTER — Inpatient Hospital Stay: Payer: Medicare Other | Attending: Oncology

## 2024-01-18 ENCOUNTER — Inpatient Hospital Stay: Payer: Medicare Other | Admitting: Oncology

## 2024-01-18 ENCOUNTER — Inpatient Hospital Stay: Payer: Medicare Other

## 2024-01-20 ENCOUNTER — Inpatient Hospital Stay: Payer: Medicare Other

## 2024-01-20 ENCOUNTER — Inpatient Hospital Stay: Payer: Medicare Other | Admitting: Oncology

## 2024-01-20 ENCOUNTER — Inpatient Hospital Stay: Payer: Medicare Other | Attending: Oncology

## 2024-01-20 ENCOUNTER — Encounter: Payer: Self-pay | Admitting: Oncology

## 2024-01-20 VITALS — BP 116/73 | HR 84 | Temp 96.6°F | Resp 19 | Wt 141.3 lb

## 2024-01-20 DIAGNOSIS — D509 Iron deficiency anemia, unspecified: Secondary | ICD-10-CM

## 2024-01-20 DIAGNOSIS — N184 Chronic kidney disease, stage 4 (severe): Secondary | ICD-10-CM

## 2024-01-20 DIAGNOSIS — I129 Hypertensive chronic kidney disease with stage 1 through stage 4 chronic kidney disease, or unspecified chronic kidney disease: Secondary | ICD-10-CM | POA: Insufficient documentation

## 2024-01-20 DIAGNOSIS — D631 Anemia in chronic kidney disease: Secondary | ICD-10-CM

## 2024-01-20 DIAGNOSIS — Z79899 Other long term (current) drug therapy: Secondary | ICD-10-CM

## 2024-01-20 DIAGNOSIS — D649 Anemia, unspecified: Secondary | ICD-10-CM | POA: Diagnosis not present

## 2024-01-20 DIAGNOSIS — N183 Chronic kidney disease, stage 3 unspecified: Secondary | ICD-10-CM

## 2024-01-20 LAB — BASIC METABOLIC PANEL - CANCER CENTER ONLY
Anion gap: 9 (ref 5–15)
BUN: 33 mg/dL — ABNORMAL HIGH (ref 8–23)
CO2: 22 mmol/L (ref 22–32)
Calcium: 8.3 mg/dL — ABNORMAL LOW (ref 8.9–10.3)
Chloride: 107 mmol/L (ref 98–111)
Creatinine: 1.89 mg/dL — ABNORMAL HIGH (ref 0.61–1.24)
GFR, Estimated: 34 mL/min — ABNORMAL LOW (ref >60)
Glucose, Bld: 101 mg/dL — ABNORMAL HIGH (ref 70–99)
Potassium: 3.9 mmol/L (ref 3.5–5.1)
Sodium: 138 mmol/L (ref 135–145)

## 2024-01-20 LAB — CBC (CANCER CENTER ONLY)
HCT: 26.7 % — ABNORMAL LOW (ref 39.0–52.0)
Hemoglobin: 8.4 g/dL — ABNORMAL LOW (ref 13.0–17.0)
MCH: 31 pg (ref 26.0–34.0)
MCHC: 31.5 g/dL (ref 30.0–36.0)
MCV: 98.5 fL (ref 80.0–100.0)
Platelet Count: 162 10*3/uL (ref 150–400)
RBC: 2.71 MIL/uL — ABNORMAL LOW (ref 4.22–5.81)
RDW: 14.1 % (ref 11.5–15.5)
WBC Count: 4.1 10*3/uL (ref 4.0–10.5)
nRBC: 0 % (ref 0.0–0.2)

## 2024-01-20 LAB — IRON AND TIBC
Iron: 49 ug/dL (ref 45–182)
Saturation Ratios: 23 % (ref 17.9–39.5)
TIBC: 211 ug/dL — ABNORMAL LOW (ref 250–450)
UIBC: 162 ug/dL

## 2024-01-20 LAB — FERRITIN: Ferritin: 218 ng/mL (ref 24–336)

## 2024-01-20 MED ORDER — DARBEPOETIN ALFA 40 MCG/0.4ML IJ SOSY
60.0000 ug | PREFILLED_SYRINGE | INTRAMUSCULAR | Status: DC
  Administered 2024-01-20: 60 ug via SUBCUTANEOUS
  Filled 2024-01-20: qty 0.6

## 2024-01-22 ENCOUNTER — Other Ambulatory Visit: Payer: Self-pay | Admitting: Internal Medicine

## 2024-01-22 ENCOUNTER — Encounter: Payer: Self-pay | Admitting: Oncology

## 2024-01-22 DIAGNOSIS — I471 Supraventricular tachycardia, unspecified: Secondary | ICD-10-CM

## 2024-01-22 DIAGNOSIS — I440 Atrioventricular block, first degree: Secondary | ICD-10-CM

## 2024-01-22 DIAGNOSIS — I491 Atrial premature depolarization: Secondary | ICD-10-CM

## 2024-01-22 NOTE — Progress Notes (Signed)
Hematology/Oncology Consult note [facility]  [phone] [phone]  Patient Care Team: Armando Gang, FNP as PCP - General (Family Medicine) End, Cristal Deer, MD as PCP - Cardiology (Cardiology) Creig Hines, MD as Consulting Physician (Oncology)   Name of the patient: Rodney Wiley  [id number]  12-Apr-1936   Date of visit: 01/22/24  Diagnosis-anemia of chronic kidney disease  Chief complaint/ Reason for visit-routine follow-up of anemia of chronic kidney disease  Heme/Onc history: patient is a 88 year old male with a past medical history significant for hypertension, hyperlipidemia, stage III CKD and history of aortic insufficiency as well as ascending aortic aneurysm.  He recently underwent bioprosthetic aortic valve replacement, CABG x3 and resuspension of the left atrium on 02/22/2018.    Results of blood work in June 2021 were consistent with iron deficiency along with CKD. He received venofer in July 2021.  He has not required any EPO injections so far   Patient's hemoglobin was stable between 10-11 up until August 2024.  He had 2 back-to-back hospitalizations for syncope, hyponatremia and nausea vomiting.  Following that his hemoglobin has been around 8.  Patient was started on Aranesp every 3 weeks    Interval history-reports having occasional trouble with sleep.  He is tolerating E-point injections well so far.  Has baseline fatigue.  ECOG PS- 1 Pain scale- 0 Opioid associated constipation-no  Review of systems- Review of Systems  Constitutional:  Positive for malaise/fatigue. Negative for chills, fever and weight loss.  HENT:  Negative for congestion, ear discharge and nosebleeds.   Eyes:  Negative for blurred vision.  Respiratory:  Negative for cough, hemoptysis, sputum production, shortness of breath and wheezing.   Cardiovascular:  Negative for chest pain, palpitations, orthopnea and claudication.   Gastrointestinal:  Negative for abdominal pain, blood in stool, constipation, diarrhea, heartburn, melena, nausea and vomiting.  Genitourinary:  Negative for dysuria, flank pain, frequency, hematuria and urgency.  Musculoskeletal:  Negative for back pain, joint pain and myalgias.  Skin:  Negative for rash.  Neurological:  Negative for dizziness, tingling, focal weakness, seizures, weakness and headaches.  Endo/Heme/Allergies:  Does not bruise/bleed easily.  Psychiatric/Behavioral:  Negative for depression and suicidal ideas. The patient does not have insomnia.       No Known Allergies   Past Medical History:  Diagnosis Date   Anemia    Aortic insufficiency    a. 02/2018 s/p bioprosthetic AVR; 04/2018 Echo: EF 50-55%, Gr2 DD, Ao bioprosthesis, mean grad , Ao root/Asc Ao nl in size, mild MR, mildly dil LA, nl RV fxn. Nl PASP.   Arthropathy    Ascending aortic aneurysm (HCC)    a. 12/2016 MRA: 4.3cm @ sinus of valsalva, 4.9cm above sinotubular jxn; b. 02/2018 s/p biological Bentall and AVR.   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 02/2018 Cath: LAD 20ost, 40p, 57m, LCX 60p, OM2 60, OM3 85, RCA 10p/m w/ L->R and R->R collats;  b. 02/2018 CABG x 3: LIMA to LAD, SVG to OM2, SVG to PDA, EVH via right thigh.   CKD (chronic kidney disease), stage III (HCC)    Diverticulitis    Essential hypertension    GERD (gastroesophageal reflux disease)    Hemorrhoids    History of chicken pox    History of Helicobacter pylori infection 06/2007   Hyperlipidemia    Hypertension    Left renal artery stenosis (HCC)    a. 01/2017 RA  u/s: moderate L RAS.   Lower extremity edema    Nonrheumatic mitral (valve) insufficiency    a. 12/2016 Echo: mild to mod MR; b. 09/2017 TEE: mild to mod MR; c. 04/2018 Echo: Mild MR.   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 s/p 21 mm Edwards Inspiris Resilia stented bovine pericardial tissue valve and 24 mm Gelweave Valsalva synthetic root conduit with reimplantation of left main coronary artery.     Past Surgical History:  Procedure Laterality Date   AORTIC VALVE REPAIR N/A 02/22/2018   Procedure: AORTIC VALVE REPLACEMENT -Biological Bentall aortic root replacement,;  Surgeon: Purcell Nails, MD;  Location: [facility] OR;  Service: Open Heart Surgery;  Laterality: N/A;   bypass surgery     CARDIAC CATHETERIZATION     01/16/2018   COLONOSCOPY  2011   by Dr. Bluford Kaufmann with findings of diverticulosis   CORONARY ARTERY BYPASS GRAFT N/A 02/22/2018   Procedure: CORONARY ARTERY BYPASS GRAFTING (CABG) x three, using left internal mammary artery and right leg greater saphenous vein harvested endoscopically;  Surgeon: Purcell Nails, MD;  Location: [facility] OR;  Service: Open Heart Surgery;  Laterality: N/A;   CORONARY/GRAFT ANGIOGRAPHY N/A 01/16/2018   Procedure: CORONARY/GRAFT ANGIOGRAPHY;  Surgeon: Yvonne Kendall, MD;  Location: [facility] INVASIVE CV LAB;  Service: Cardiovascular;  Laterality: N/A;   ELBOW SURGERY Left    hemorhoidectomy     RIGHT HEART CATH N/A 01/16/2018   Procedure: RIGHT HEART CATH;  Surgeon: Yvonne Kendall, MD;  Location: [facility] INVASIVE CV LAB;  Service: Cardiovascular;  Laterality: N/A;   SHOULDER ARTHROSCOPY WITH ROTATOR CUFF REPAIR Right    SHOULDER SURGERY Right    TEE WITHOUT CARDIOVERSION N/A 09/27/2017   Procedure: TRANSESOPHAGEAL ECHOCARDIOGRAM (TEE);  Surgeon: Lars Masson, MD;  Location: [facility] ENDOSCOPY;  Service: Cardiovascular;  Laterality: N/A;   TEE WITHOUT CARDIOVERSION N/A 02/22/2018   Procedure: TRANSESOPHAGEAL ECHOCARDIOGRAM (TEE);  Surgeon: Purcell Nails, MD;  Location: [facility] OR;  Service: Open Heart Surgery;  Laterality: N/A;   THORACIC AORTIC ANEURYSM REPAIR N/A 02/22/2018   Procedure: THORACIC ASCENDING ANEURYSM REPAIR (AAA) Resection of ascending aorta aneurysm;  Surgeon: Purcell Nails, MD;   Location[facility] OR;  Service: Open Heart Surgery;  Laterality: N/A;    Social History   Socioeconomic History   Marital status: Widowed    Spouse name: Not on file   Number of children: Not on file   Years of education: Not on file   Highest education level: Not on file  Occupational History   Not on file  Tobacco Use   Smoking status: Never   Smokeless tobacco: Never  Vaping Use   Vaping status: Never Used  Substance and Sexual Activity   Alcohol use: No   Drug use: No   Sexual activity: Not Currently  Other Topics Concern   Not on file  Social History Narrative   Not on file   Social Drivers of Health   Financial Resource Strain: Patient Declined (09/01/2023)   Received from [facility]   Overall Financial Resource Strain (CARDIA)    Difficulty of Paying Living Expenses: Patient declined  Food Insecurity: No Food Insecurity (09/01/2023)   Received from [facility]   Hunger Vital Sign    Worried About Running Out of Food in the Last Year: Never true    Ran Out of Food in the Last Year: Never  true  Transportation Needs: No Transportation Needs (09/01/2023)   Received from [facility]   [facility] - Transportation    Lack of Transportation (Medical): No    Lack of Transportation (Non-Medical): No  Physical Activity: Not on file  Stress: Not on file  Social Connections: Not on file  Intimate Partner Violence: Not At Risk (07/26/2023)   Humiliation, Afraid, Rape, and Kick questionnaire    Fear of Current or Ex-Partner: No    Emotionally Abused: No    Physically Abused: No    Sexually Abused: No    Family History  Problem Relation Age of Onset   Hypertension Mother    Stroke Mother    Leukemia Father    Heart attack Paternal Grandmother    Stroke Paternal Grandfather    Heart Problems Son 25   Heart Problems Son 71   Uterine cancer Sister    Prostate cancer Brother    Colon cancer Brother    Kidney failure Brother    Stroke Sister      Current  Outpatient Medications:    amiodarone (PACERONE) 100 MG tablet, TAKE 1 TABLET BY MOUTH EVERY DAY, Disp: 90 tablet, Rfl: 0   amLODipine (NORVASC) 10 MG tablet, Take 5 mg by mouth daily., Disp: , Rfl:    aspirin EC 81 MG tablet, Take 81 mg by mouth daily., Disp: , Rfl:    atorvastatin (LIPITOR) 40 MG tablet, TAKE 1 TABLET BY MOUTH EVERY DAY, Disp: 90 tablet, Rfl: 0   cholecalciferol (VITAMIN D3) 25 MCG (1000 UT) tablet, Take 1,000 Units by mouth daily., Disp: , Rfl:    enalapril (VASOTEC) 10 MG tablet, Take 10 mg by mouth daily., Disp: , Rfl:    gabapentin (NEURONTIN) 100 MG capsule, Take 100 mg by mouth at bedtime., Disp: , Rfl:    hydrALAZINE (APRESOLINE) 50 MG tablet, Take 50 mg by mouth 2 (two) times daily., Disp: , Rfl:    isosorbide mononitrate (IMDUR) 60 MG 24 hr tablet, TAKE 1 TABLET BY MOUTH EVERY DAY, Disp: 90 tablet, Rfl: 0   levothyroxine (SYNTHROID) 25 MCG tablet, Take 25 mcg by mouth every morning., Disp: , Rfl:    nitroGLYCERIN (NITROSTAT) 0.4 MG SL tablet, Place 1 tablet (0.4 mg total) under the tongue every 5 (five) minutes as needed for chest pain. Maximum of 3 doses., Disp: 25 tablet, Rfl: 2   ondansetron (ZOFRAN) 4 MG tablet, Take 1 tablet (4 mg total) by mouth every 6 (six) hours as needed for nausea., Disp: 20 tablet, Rfl: 0   oxyCODONE (OXY IR/ROXICODONE) 5 MG immediate release tablet, Take 5 mg by mouth every 6 (six) hours as needed., Disp: , Rfl:    pantoprazole (PROTONIX) 40 MG tablet, Take 1 tablet (40 mg total) by mouth daily., Disp: 30 tablet, Rfl: 1   tamsulosin (FLOMAX) 0.4 MG CAPS capsule, Take 1 capsule (0.4 mg total) by mouth daily., Disp: 30 capsule, Rfl: 11   torsemide (DEMADEX) 10 MG tablet, TAKE 1 TABLET BY MOUTH EVERY DAY AS NEEDED, Disp: 90 tablet, Rfl: 0   traMADol (ULTRAM) 50 MG tablet, Take 50 mg by mouth every 6 (six) hours as needed., Disp: , Rfl:   Physical exam:  Vitals:   01/20/24 1410  BP: 116/73  Pulse: 84  Resp: 19  Temp: (!) 96.6 F (35.9 C)   TempSrc: Tympanic  SpO2: 100%  Weight: 141 lb 4.8 oz (64.1 kg)   Physical Exam Cardiovascular:     Rate and Rhythm: Normal rate  and regular rhythm.     Heart sounds: Normal heart sounds.  Pulmonary:     Effort: Pulmonary effort is normal.     Breath sounds: Normal breath sounds.  Abdominal:     General: Bowel sounds are normal.     Palpations: Abdomen is soft.  Skin:    General: Skin is warm and dry.  Neurological:     Mental Status: He is alert and oriented to person, place, and time.         Latest Ref Rng & Units 01/20/2024    1:53 PM  CMP  Glucose 70 - 99 mg/dL 409   BUN 8 - 23 mg/dL 33   Creatinine 8.11 - 1.24 mg/dL 9.14   Sodium 782 - 956 mmol/L 138   Potassium 3.5 - 5.1 mmol/L 3.9   Chloride 98 - 111 mmol/L 107   CO2 22 - 32 mmol/L 22   Calcium 8.9 - 10.3 mg/dL 8.3       Latest Ref Rng & Units 01/20/2024    1:54 PM  CBC  WBC 4.0 - 10.5 K/uL 4.1   Hemoglobin 13.0 - 17.0 g/dL 8.4   Hematocrit 21.3 - 52.0 % 26.7   Platelets 150 - 400 K/uL 162      Assessment and plan- Patient is a 88 y.o. male Follow-up of anemia of chronic kidney disease  Aranesp was initiated in October 2024 and despite receiving that every 3 weeks he has not had much improvement in his hemoglobin today his H&H today is 8.4/26.7 similar to what it was in September 2024.  Ferritin levels are normal at 218 with an iron saturation of 23%.  He is therefore not iron deficient.  He also underwent anemia workup back in October 2024 including myeloma panel and serum free light chains which were unremarkable.  B12 folate TSH has been normal as well.  I plan to increase the frequency of Aranesp to every 2 weeks but given no improvement in his anemia I would recommend getting a bone marrow biopsy to rule out any other etiology of anemia such as MDS.  I will tentatively see him back in 3 weeks time to discuss the results of bone marrow biopsy.  He will continue with Aranesp every 2 weeks in the meanwhile.   Discussed risks and benefits of bone marrow biopsy including all but not limited to possible risk of bleeding infection.  This would be given under moderate sedation.  Patient understands and agrees to proceed as planned.   Visit Diagnosis 1. Anemia in stage 4 chronic kidney disease (HCC)   2. Iron deficiency anemia, unspecified iron deficiency anemia type   3. Erythropoietin (EPO) stimulating agent anemia management patient      Dr. Owens Shark, MD, MPH [facility] [phone] 01/22/2024 8:53 AM

## 2024-01-23 NOTE — Telephone Encounter (Signed)
Please advise. Please see pharmacy note. Thank you so much.

## 2024-01-26 NOTE — Progress Notes (Unsigned)
Cardiology Office Note:  .   Date:  01/27/2024  ID:  Rodney Wiley, DOB 1936-11-30, MRN [id number] PCP: Armando Gang, FNP  [facility] Providers Cardiologist:  Yvonne Kendall, MD     History of Present Illness: .   Rodney Wiley is a 88 y.o. male coronary artery disease status post CABG (02/2018), thoracic aortic aneurysm and aortic regurgitation status post Bentall procedure with bioprosthetic aortic valve replacement (02/2018), chronic HFpEF, SVT and frequent PACs, resistant hypertension, and chronic kidney disease stage IV with question of left renal artery stenosis, who presents for follow-up of CAD, hypertension, and aortic/aortic valve disease.  He was last seen in our office in 10/2023 by Rodney Furth, PA, at which time he reported some weakness and fatigue.  He had been hospitalized in 08/2023 at [facility] with hyponatremia secondary to SIADH as well as GERD with significant dysphagia.  He had urinary retention last month.  He currently has a Foley catheter in place and is scheduled for cystoscopy next month with [facility] urology.  From a heart standpoint, Rodney Wiley has been doing well.  He denies chest pain, shortness of breath, and palpitations.  He has mild lower extremity edema that seem to worsen around the time of his urinary retention/renal issues.  He has also had a couple episodes of transient lightheadedness but has not passed out or fallen.  He notes that his blood pressures have been running somewhat low.  He also recently received a notice from his insurance that his enalapril is not on formulary.  ROS: See HPI  Studies Reviewed: .        Risk Assessment/Calculations:             Physical Exam:   VS:  BP (!) 118/58 (BP Location: Left Arm, Patient Position: Sitting, Cuff Size: Normal)   Pulse 94   Ht 5\' 7"  (1.702 m)   Wt 144 lb 2 oz (65.4 kg)   SpO2 97%   BMI 22.57 kg/m    Wt Readings from Last 3 Encounters:  01/27/24 144 lb 2 oz (65.4 kg)  01/20/24  141 lb 4.8 oz (64.1 kg)  10/27/23 148 lb 12.8 oz (67.5 kg)    General:  NAD. Neck: No JVD or HJR. Lungs: Clear to auscultation bilaterally without wheezes or crackles. Heart: Regular rate and rhythm with 2/6 systolic murmur.  No rubs or gallops. Abdomen: Soft, nontender, nondistended. Extremities: 1+ LE edema (L>R).  ASSESSMENT AND PLAN: .    Coronary artery disease: Rodney Wiley has not had any significant angina.  Will plan to continue antianginal therapy with amlodipine and isosorbide mononitrate as well as secondary prevention with aspirin and atorvastatin.  Chronic HFpEF: Rodney Wiley has mild lower extremity edema, which has been chronic but recently exacerbated by his bladder/renal issues.  He can continue to using his as needed torsemide.  Defer other medication changes at this time.  Hypertension: Blood pressure low normal today and frequently at other recent visits.  We have agreed to switch from enalapril 10 mg daily to lisinopril 5 mg daily.  Continue current doses of amlodipine, hydralazine, and isosorbide mononitrate.  I would have a low threshold for reducing/stopping hydralazine and/or isosorbide mononitrate in the future if blood pressure allows.  Check renal function and electrolytes in about 2 weeks.  Thoracic aortic aneurysm and aortic regurgitation status post bio Bentall: No symptoms to suggest valve incompetence.  Continue clinical follow-up and appropriate SBE prophylaxis.  Chronic kidney disease stage IV: Continue  close monitoring in the setting of recent urologic issues per Rodney Wiley and urology/nephrology.  PSVT: No palpitations reported.  Check CMP and TSH in about 2 weeks with plans to continue amiodarone 100 mg daily.  Urinary retention and preoperative cardiovascular risk assessment: Rodney Wiley does not have any symptoms of decompensated heart failure or angina.  I think it is reasonable for him to proceed with cystoscopy, which is low risk from a  cardiovascular perioperative risk standpoint, without additional cardiac testing or intervention.  Ideally, aspirin 81 mg daily would be continued during the perioperative period.  Appropriate SBE prophylaxis should be administered at the discretion of urology.    Dispo: Return to clinic in 3 months.  Signed, Yvonne Kendall, MD

## 2024-01-27 ENCOUNTER — Other Ambulatory Visit (HOSPITAL_COMMUNITY): Payer: Self-pay | Admitting: Student

## 2024-01-27 ENCOUNTER — Encounter: Payer: Self-pay | Admitting: Internal Medicine

## 2024-01-27 ENCOUNTER — Ambulatory Visit: Payer: Medicare Other | Attending: Internal Medicine | Admitting: Internal Medicine

## 2024-01-27 VITALS — BP 118/58 | HR 94 | Ht 67.0 in | Wt 144.1 lb

## 2024-01-27 DIAGNOSIS — I25118 Atherosclerotic heart disease of native coronary artery with other forms of angina pectoris: Secondary | ICD-10-CM | POA: Diagnosis not present

## 2024-01-27 DIAGNOSIS — Z79899 Other long term (current) drug therapy: Secondary | ICD-10-CM

## 2024-01-27 DIAGNOSIS — I7121 Aneurysm of the ascending aorta, without rupture: Secondary | ICD-10-CM | POA: Diagnosis not present

## 2024-01-27 DIAGNOSIS — I10 Essential (primary) hypertension: Secondary | ICD-10-CM

## 2024-01-27 DIAGNOSIS — D509 Iron deficiency anemia, unspecified: Secondary | ICD-10-CM

## 2024-01-27 DIAGNOSIS — N184 Chronic kidney disease, stage 4 (severe): Secondary | ICD-10-CM

## 2024-01-27 DIAGNOSIS — I5032 Chronic diastolic (congestive) heart failure: Secondary | ICD-10-CM | POA: Diagnosis not present

## 2024-01-27 DIAGNOSIS — Z952 Presence of prosthetic heart valve: Secondary | ICD-10-CM

## 2024-01-27 DIAGNOSIS — I471 Supraventricular tachycardia, unspecified: Secondary | ICD-10-CM

## 2024-01-27 MED ORDER — LISINOPRIL 5 MG PO TABS: 5.0000 mg | ORAL_TABLET | Freq: Every day | ORAL | 3 refills | Status: DC

## 2024-01-27 NOTE — Patient Instructions (Addendum)
Medication Instructions:  Your physician recommends the following medication changes.  STOP TAKING: Enalapril   START TAKING: Lisinopril 5 mg by mouth daily  *If you need a refill on your cardiac medications before your next appointment, please call your pharmacy*   Lab Work: Your provider would like for you to return in 2 weeks to have the following labs drawn: (CMP, TSH).   Please go to [facility] [address] ([facility] [address], [place] [address] You do not need an appointment.  They are open from 8 am- 4:30 pm.  Lunch from 1:00 pm- 2:00 pm You will need to be fasting.    Testing/Procedures: No test ordered today    Follow-Up: At [facility], you and your health needs are our priority.  As part of our continuing mission to provide you with exceptional heart care, we have created designated Provider Care Teams.  These Care Teams include your primary Cardiologist (physician) and Advanced Practice Providers (APPs -  Physician Assistants and Nurse Practitioners) who all work together to provide you with the care you need, when you need it.  We recommend signing up for the patient portal called "MyChart".  Sign up information is provided on this After Visit Summary.  MyChart is used to connect with patients for Virtual Visits (Telemedicine).  Patients are able to view lab/test results, encounter notes, upcoming appointments, etc.  Non-urgent messages can be sent to your provider as well.   To learn more about what you can do with MyChart, go to [email or web address].    Your next appointment:   3 month(s)  Provider:   You may see Yvonne Kendall, MD or one of the following Advanced Practice Providers on your designated Care Team:   Nicolasa Ducking, NP Eula Listen, PA-C Cadence Fransico Michael, PA-C Charlsie Quest, NP Carlos Levering, NP

## 2024-01-27 NOTE — Progress Notes (Signed)
Patient for IR Bone Marrow Biopsy on Monday 01/30/24, I called and spoke with the patient's daughter, [name] on the phone and gave pre-procedure instructions. [name] was made aware to have the patient here at 8:30a, NPO after MN prior to procedure as well as driver post procedure/recovery/discharge. [name] stated understanding.  Called 01/27/24

## 2024-01-30 ENCOUNTER — Encounter: Payer: Self-pay | Admitting: Oncology

## 2024-01-30 ENCOUNTER — Encounter: Payer: Self-pay | Admitting: Radiology

## 2024-01-30 ENCOUNTER — Other Ambulatory Visit: Payer: Self-pay

## 2024-01-30 ENCOUNTER — Telehealth: Payer: Self-pay | Admitting: Pharmacy Technician

## 2024-01-30 ENCOUNTER — Other Ambulatory Visit (HOSPITAL_COMMUNITY): Payer: Self-pay

## 2024-01-30 ENCOUNTER — Ambulatory Visit
Admission: RE | Admit: 2024-01-30 | Discharge: 2024-01-30 | Disposition: A | Discharge status: Home / Self Care | Payer: Medicare Other | Source: Ambulatory Visit | Attending: Oncology | Admitting: Oncology

## 2024-01-30 DIAGNOSIS — D631 Anemia in chronic kidney disease: Secondary | ICD-10-CM | POA: Insufficient documentation

## 2024-01-30 DIAGNOSIS — Z951 Presence of aortocoronary bypass graft: Secondary | ICD-10-CM | POA: Diagnosis not present

## 2024-01-30 DIAGNOSIS — I129 Hypertensive chronic kidney disease with stage 1 through stage 4 chronic kidney disease, or unspecified chronic kidney disease: Secondary | ICD-10-CM | POA: Insufficient documentation

## 2024-01-30 DIAGNOSIS — N184 Chronic kidney disease, stage 4 (severe): Secondary | ICD-10-CM | POA: Diagnosis present

## 2024-01-30 DIAGNOSIS — D509 Iron deficiency anemia, unspecified: Secondary | ICD-10-CM | POA: Insufficient documentation

## 2024-01-30 DIAGNOSIS — Z841 Family history of disorders of kidney and ureter: Secondary | ICD-10-CM | POA: Diagnosis not present

## 2024-01-30 DIAGNOSIS — Z953 Presence of xenogenic heart valve: Secondary | ICD-10-CM | POA: Insufficient documentation

## 2024-01-30 DIAGNOSIS — Z8249 Family history of ischemic heart disease and other diseases of the circulatory system: Secondary | ICD-10-CM | POA: Insufficient documentation

## 2024-01-30 HISTORY — PX: IR BONE MARROW BIOPSY & ASPIRATION: IMG5727

## 2024-01-30 LAB — CBC WITH DIFFERENTIAL/PLATELET
Abs Immature Granulocytes: 0.02 10*3/uL (ref 0.00–0.07)
Basophils Absolute: 0 10*3/uL (ref 0.0–0.1)
Basophils Relative: 0 %
Eosinophils Absolute: 0.2 10*3/uL (ref 0.0–0.5)
Eosinophils Relative: 2 %
HCT: 27.8 % — ABNORMAL LOW (ref 39.0–52.0)
Hemoglobin: 8.8 g/dL — ABNORMAL LOW (ref 13.0–17.0)
Immature Granulocytes: 0 %
Lymphocytes Relative: 19 %
Lymphs Abs: 1.2 10*3/uL (ref 0.7–4.0)
MCH: 31.1 pg (ref 26.0–34.0)
MCHC: 31.7 g/dL (ref 30.0–36.0)
MCV: 98.2 fL (ref 80.0–100.0)
Monocytes Absolute: 0.5 10*3/uL (ref 0.1–1.0)
Monocytes Relative: 8 %
Neutro Abs: 4.6 10*3/uL (ref 1.7–7.7)
Neutrophils Relative %: 71 %
Platelets: 163 10*3/uL (ref 150–400)
RBC: 2.83 MIL/uL — ABNORMAL LOW (ref 4.22–5.81)
RDW: 15.4 % (ref 11.5–15.5)
WBC: 6.5 10*3/uL (ref 4.0–10.5)
nRBC: 0 % (ref 0.0–0.2)

## 2024-01-30 MED ORDER — FENTANYL CITRATE (PF) 100 MCG/2ML IJ SOLN
INTRAMUSCULAR | Status: AC
  Filled 2024-01-30: qty 2

## 2024-01-30 MED ORDER — LIDOCAINE 1 % OPTIME INJ - NO CHARGE
10.0000 mL | Freq: Once | INTRAMUSCULAR | Status: AC
  Administered 2024-01-30: 5 mL via INTRADERMAL
  Filled 2024-01-30: qty 10

## 2024-01-30 MED ORDER — HEPARIN SOD (PORK) LOCK FLUSH 100 UNIT/ML IV SOLN
INTRAVENOUS | Status: AC
  Filled 2024-01-30: qty 5

## 2024-01-30 MED ORDER — FENTANYL CITRATE (PF) 100 MCG/2ML IJ SOLN
INTRAMUSCULAR | Status: AC | PRN
  Administered 2024-01-30: 50 ug via INTRAVENOUS

## 2024-01-30 MED ORDER — SODIUM CHLORIDE 0.9 % IV SOLN: INTRAVENOUS | Status: DC

## 2024-01-30 MED ORDER — MIDAZOLAM HCL 2 MG/2ML IJ SOLN
INTRAMUSCULAR | Status: AC | PRN
  Administered 2024-01-30: 1 mg via INTRAVENOUS

## 2024-01-30 MED ORDER — MIDAZOLAM HCL 2 MG/2ML IJ SOLN
INTRAMUSCULAR | Status: AC
  Filled 2024-01-30: qty 2

## 2024-01-30 NOTE — Telephone Encounter (Signed)
 Pharmacy Patient Advocate Encounter   Received notification from Patient Advice Request messages that prior authorization for amiodarone is required/requested.   Insurance verification completed.   The patient is insured through [employer] [place] .   Per test claim: PA required; PA submitted to above mentioned insurance via CoverMyMeds Key/confirmation #/EOC [id number] Status is pending

## 2024-01-30 NOTE — Procedures (Signed)
 Interventional Radiology Procedure Note  Procedure: CT guided aspirate and core biopsy of left iliac bone Complications: None Recommendations: - Bedrest supine x 1 hrs - Hydrocodone PRN  Pain - Follow biopsy results  Signed,  Sterling Big, MD

## 2024-01-30 NOTE — Progress Notes (Signed)
 Patient clinically stable post IR BMB per Dr Archer Asa, tolerated well. Vitals stable pre and post procedure, received Versed 1 mg along with Fentanyl 50 mcg IV for procedure. Report given to Marni Griffon post procedure/specials/13.

## 2024-01-30 NOTE — H&P (Signed)
 Chief Complaint: Patient was seen in consultation today for anemia in the setting of CKD4, with consideration for bone marrow biopsy and aspiration.  Referring Provider(s): Dr. Owens Shark, MD  Supervising Physician: Malachy Moan  Patient Status: [facility] - Out-pt  Patient is Full Code  History of Present Illness: Rodney Wiley is a 88 y.o. male with PMHx notable for hypertension, hyperlipidemia, stage III CKD and history of aortic insufficiency as well as ascending aortic aneurysm.  He recently underwent bioprosthetic aortic valve replacement, CABG x3 and resuspension of the left atrium on 02/22/2018.   Per Dr. Assunta Gambles progress note on 2/14; Assessment and plan- Patient is a 88 y.o. male Follow-up of anemia of chronic kidney disease   Aranesp was initiated in October 2024 and despite receiving that every 3 weeks he has not had much improvement in his hemoglobin today his H&H today is 8.4/26.7 similar to what it was in September 2024.  Ferritin levels are normal at 218 with an iron saturation of 23%.  He is therefore not iron deficient.  He also underwent anemia workup back in October 2024 including myeloma panel and serum free light chains which were unremarkable.  B12 folate TSH has been normal as well.  I plan to increase the frequency of Aranesp to every 2 weeks but given no improvement in his anemia I would recommend getting a bone marrow biopsy to rule out any other etiology of anemia such as MDS.  I will tentatively see him back in 3 weeks time to discuss the results of bone marrow biopsy.  He will continue with Aranesp every 2 weeks in the meanwhile.  Discussed risks and benefits of bone marrow biopsy including all but not limited to possible risk of bleeding infection.  This would be given under moderate sedation.  Patient understands and agrees to proceed as planned.  Interventional Radiology was requested for bone marrow biopsy. Patient is scheduled for same in IR today.  All  labs and medications are within acceptable parameters. No pertinent allergies. Patient has been NPO since midnight.    Currently without any significant complaints. Patient alert and laying in bed,calm. Denies any fevers, headache, chest pain, SOB, cough, abdominal pain, nausea, vomiting or bleeding.      Past Medical History:  Diagnosis Date   Anemia    Aortic insufficiency    a. 02/2018 s/p bioprosthetic AVR; 04/2018 Echo: EF 50-55%, Gr2 DD, Ao bioprosthesis, mean grad , Ao root/Asc Ao nl in size, mild MR, mildly dil LA, nl RV fxn. Nl PASP.   Arthropathy    Ascending aortic aneurysm (HCC)    a. 12/2016 MRA: 4.3cm @ sinus of valsalva, 4.9cm above sinotubular jxn; b. 02/2018 s/p biological Bentall and AVR.   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 02/2018 Cath: LAD 20ost, 40p, 34m, LCX 60p, OM2 60, OM3 85, RCA 10p/m w/ L->R and R->R collats;  b. 02/2018 CABG x 3: LIMA to LAD, SVG to OM2, SVG to PDA, EVH via right thigh.   CKD (chronic kidney disease), stage III (HCC)    Diverticulitis    Essential hypertension    GERD (gastroesophageal reflux disease)    Hemorrhoids    History of chicken pox    History of Helicobacter pylori infection 06/2007   Hyperlipidemia    Hypertension    Left renal artery stenosis (HCC)    a. 01/2017 RA u/s: moderate L RAS.   Lower extremity  edema    Nonrheumatic mitral (valve) insufficiency    a. 12/2016 Echo: mild to mod MR; b. 09/2017 TEE: mild to mod MR; c. 04/2018 Echo: Mild MR.   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 s/p 21 mm Edwards Inspiris Resilia stented bovine pericardial tissue valve and 24 mm Gelweave Valsalva synthetic root conduit with reimplantation of left main coronary artery.    Past Surgical History:  Procedure Laterality Date   AORTIC VALVE REPAIR N/A 02/22/2018   Procedure: AORTIC  VALVE REPLACEMENT -Biological Bentall aortic root replacement,;  Surgeon: Purcell Nails, MD;  Location: [facility] OR;  Service: Open Heart Surgery;  Laterality: N/A;   bypass surgery     CARDIAC CATHETERIZATION     01/16/2018   COLONOSCOPY  2011   by Dr. Bluford Kaufmann with findings of diverticulosis   CORONARY ARTERY BYPASS GRAFT N/A 02/22/2018   Procedure: CORONARY ARTERY BYPASS GRAFTING (CABG) x three, using left internal mammary artery and right leg greater saphenous vein harvested endoscopically;  Surgeon: Purcell Nails, MD;  Location: [facility] OR;  Service: Open Heart Surgery;  Laterality: N/A;   CORONARY/GRAFT ANGIOGRAPHY N/A 01/16/2018   Procedure: CORONARY/GRAFT ANGIOGRAPHY;  Surgeon: Yvonne Kendall, MD;  Location: [facility] INVASIVE CV LAB;  Service: Cardiovascular;  Laterality: N/A;   ELBOW SURGERY Left    hemorhoidectomy     RIGHT HEART CATH N/A 01/16/2018   Procedure: RIGHT HEART CATH;  Surgeon: Yvonne Kendall, MD;  Location: [facility] INVASIVE CV LAB;  Service: Cardiovascular;  Laterality: N/A;   SHOULDER ARTHROSCOPY WITH ROTATOR CUFF REPAIR Right    SHOULDER SURGERY Right    TEE WITHOUT CARDIOVERSION N/A 09/27/2017   Procedure: TRANSESOPHAGEAL ECHOCARDIOGRAM (TEE);  Surgeon: Lars Masson, MD;  Location[facility] ENDOSCOPY;  Service: Cardiovascular;  Laterality: N/A;   TEE WITHOUT CARDIOVERSION N/A 02/22/2018   Procedure: TRANSESOPHAGEAL ECHOCARDIOGRAM (TEE);  Surgeon: Purcell Nails, MD;  Location: [facility] OR;  Service: Open Heart Surgery;  Laterality: N/A;   THORACIC AORTIC ANEURYSM REPAIR N/A 02/22/2018   Procedure: THORACIC ASCENDING ANEURYSM REPAIR (AAA) Resection of ascending aorta aneurysm;  Surgeon: Purcell Nails, MD;  Location: [facility] OR;  Service: Open Heart Surgery;  Laterality: N/A;    Allergies: Patient has no known allergies.  Medications: Prior to Admission medications   Medication Sig Start Date End Date Taking? Authorizing Provider  amiodarone (PACERONE) 100 MG tablet TAKE 1 TABLET BY MOUTH  EVERY DAY 12/14/23   End, Cristal Deer, MD  amLODipine (NORVASC) 10 MG tablet Take 5 mg by mouth daily.    [provider]  aspirin EC 81 MG tablet Take 81 mg by mouth daily.    [provider]  atorvastatin (LIPITOR) 40 MG tablet TAKE 1 TABLET BY MOUTH EVERY DAY 01/13/24   End, Cristal Deer, MD  cholecalciferol (VITAMIN D3) 25 MCG (1000 UT) tablet Take 1,000 Units by mouth daily.    [provider]  gabapentin (NEURONTIN) 100 MG capsule Take 100 mg by mouth at bedtime. 06/28/23   [provider]  hydrALAZINE (APRESOLINE) 50 MG tablet Take 50 mg by mouth 2 (two) times daily. 07/22/23   [provider]  isosorbide mononitrate (IMDUR) 60 MG 24 hr tablet TAKE 1 TABLET BY MOUTH EVERY DAY 01/13/24   End, Cristal Deer, MD  levothyroxine (SYNTHROID) 25 MCG tablet Take 25 mcg by mouth every morning. 06/25/21   [provider]  lisinopril (ZESTRIL) 5 MG tablet  Take 1 tablet (5 mg total) by mouth daily. 01/27/24 04/26/24  End, Cristal Deer, MD  nitroGLYCERIN (NITROSTAT) 0.4 MG SL tablet Place 1 tablet (0.4 mg total) under the tongue every 5 (five) minutes as needed for chest pain. Maximum of 3 doses. 11/12/20   End, Cristal Deer, MD  ondansetron (ZOFRAN) 4 MG tablet Take 1 tablet (4 mg total) by mouth every 6 (six) hours as needed for nausea. 08/21/23   Loyce Dys, MD  oxyCODONE (OXY IR/ROXICODONE) 5 MG immediate release tablet Take 5 mg by mouth every 6 (six) hours as needed. 08/16/23   [provider]  pantoprazole (PROTONIX) 40 MG tablet Take 1 tablet (40 mg total) by mouth daily. 08/21/23 08/20/24  Loyce Dys, MD  tamsulosin (FLOMAX) 0.4 MG CAPS capsule Take 1 capsule (0.4 mg total) by mouth daily. 01/05/24   McGowan, Carollee Herter A, PA-C  torsemide (DEMADEX) 10 MG tablet TAKE 1 TABLET BY MOUTH EVERY DAY AS NEEDED 07/11/23   End, Cristal Deer, MD  traMADol (ULTRAM) 50 MG tablet Take 50 mg by mouth every 6 (six) hours as needed. 07/15/23   [provider]      Family History  Problem Relation Age of Onset   Hypertension Mother    Stroke Mother    Leukemia Father    Heart attack Paternal Grandmother    Stroke Paternal Grandfather    Heart Problems Son 32   Heart Problems Son 104   Uterine cancer Sister    Prostate cancer Brother    Colon cancer Brother    Kidney failure Brother    Stroke Sister     Social History   Socioeconomic History   Marital status: Widowed    Spouse name: Not on file   Number of children: Not on file   Years of education: Not on file   Highest education level: Not on file  Occupational History   Not on file  Tobacco Use   Smoking status: Never   Smokeless tobacco: Never  Vaping Use   Vaping status: Never Used  Substance and Sexual Activity   Alcohol use: No   Drug use: No   Sexual activity: Not Currently  Other Topics Concern   Not on file  Social History Narrative   Not on file   Social Drivers of Health   Financial Resource Strain: Patient Declined (09/01/2023)   Received from [facility]   Overall Financial Resource Strain (CARDIA)    Difficulty of Paying Living Expenses: Patient declined  Food Insecurity: No Food Insecurity (09/01/2023)   Received from [facility]   Hunger Vital Sign    Worried About Running Out of Food in the Last Year: Never true    Ran Out of Food in the Last Year: Never true  Transportation Needs: No Transportation Needs (09/01/2023)   Received from [facility]   PRAPARE - Transportation    Lack of Transportation (Medical): No    Lack of Transportation (Non-Medical): No  Physical Activity: Not on file  Stress: Not on file  Social Connections: Not on file     Review of Systems: A 12 point ROS discussed and pertinent positives are indicated in the HPI above.  All other systems are negative.   Vital Signs: Wt Readings from Last 3 Encounters:  01/30/24 145 lb (65.8 kg)  01/27/24 144 lb 2 oz (65.4 kg)  01/20/24 141 lb 4.8 oz (64.1 kg)   Temp  Readings from Last 3 Encounters:  01/30/24 97.9 F (  36.6 C) (Oral)  01/20/24 (!) 96.6 F (35.9 C) (Tympanic)  09/23/23 97.8 F (36.6 C) (Oral)   BP Readings from Last 3 Encounters:  01/30/24 (!) 143/66  01/27/24 (!) 118/58  01/20/24 116/73   Pulse Readings from Last 3 Encounters:  01/30/24 82  01/27/24 94  01/20/24 84     Advance Care Plan: The advanced care place/surrogate decision maker was discussed at the time of visit and the patient did not wish to discuss or was not able to name a surrogate decision maker or provide an advance care plan.  Physical Exam Vitals reviewed.  Constitutional:      General: He is not in acute distress.    Appearance: Normal appearance.  HENT:     Mouth/Throat:     Mouth: Mucous membranes are dry.  Cardiovascular:     Rate and Rhythm: Normal rate.     Pulses: Normal pulses.     Heart sounds: Murmur heard.  Pulmonary:     Effort: Pulmonary effort is normal. No respiratory distress.     Breath sounds: Normal breath sounds.  Abdominal:     General: Abdomen is flat.     Tenderness: There is no abdominal tenderness.  Musculoskeletal:        General: Normal range of motion.     Cervical back: Normal range of motion.  Skin:    General: Skin is warm and dry.  Neurological:     Mental Status: He is alert and oriented to person, place, and time.  Psychiatric:        Mood and Affect: Mood normal.        Behavior: Behavior normal.        Thought Content: Thought content normal.        Judgment: Judgment normal.     Imaging: No results found.  Labs:  CBC: Recent Labs    08/23/23 1158 09/23/23 0950 09/29/23 0945 11/11/23 1006 12/02/23 0958 01/20/24 1354 01/30/24 0902  WBC 6.0 4.3  --   --   --  4.1 6.5  HGB 8.7* 7.6*   < > 9.8* 9.8* 8.4* 8.8*  HCT 25.5* 23.5*   < > 30.4* 30.9* 26.7* 27.8*  PLT 145* 199  --   --   --  162 163   < > = values in this interval not displayed.    COAGS: Recent Labs    08/20/23 2330  INR 1.2     BMP: Recent Labs    08/21/23 0248 08/23/23 1158 09/29/23 0944 01/20/24 1353  NA 131* 132* 137 138  K 4.4 4.3 4.6 3.9  CL 103 103 112* 107  CO2 19* 20* 18* 22  GLUCOSE 118* 110* 106* 101*  BUN 36* 26* 40* 33*  CALCIUM 8.4* 8.5* 8.3* 8.3*  CREATININE 1.97* 2.20* 2.24* 1.89*  GFRNONAA 33* 29* 28* 34*    LIVER FUNCTION TESTS: Recent Labs    07/26/23 0926 07/27/23 0417 08/20/23 1945 08/23/23 1158  BILITOT 0.6 0.9 1.0 1.2  AST 20 22 18 20   ALT 17 16 15 14   ALKPHOS 53 56 54 52  PROT 6.8 7.1 6.4* 6.2*  ALBUMIN 3.6 3.8 3.6 3.8    TUMOR MARKERS: No results for input(s): "AFPTM", "CEA", "CA199", "CHROMGRNA" in the last 8760 hours.  Assessment and Plan: Patient is a 88 y.o. male Follow-up of anemia of chronic kidney disease   Aranesp was initiated in October 2024 and despite receiving that every 3 weeks he has not had much improvement  in his hemoglobin today his H&H today is 8.4/26.7 similar to what it was in September 2024.  Ferritin levels are normal at 218 with an iron saturation of 23%.  He is therefore not iron deficient.  He also underwent anemia workup back in October 2024 including myeloma panel and serum free light chains which were unremarkable.  B12 folate TSH has been normal as well.  I plan to increase the frequency of Aranesp to every 2 weeks but given no improvement in his anemia I would recommend getting a bone marrow biopsy to rule out any other etiology of anemia such as MDS. Patient understands and agrees to proceed as planned.  Patient presents for scheduled bone marrow biopsy in IR today.  Risks and benefits of bone marrow biopsy and aspiration was discussed with the patient and/or patient's family including, but not limited to bleeding, infection, damage to adjacent structures or low yield requiring additional tests.  All of the questions were answered and there is agreement to proceed.  Consent signed and in chart.    Thank you for allowing our  service to participate in Rodney Wiley 's care.  Electronically Signed: Sable Feil, PA-C   01/30/2024, 9:24 AM      I spent a total of 30 Minutes in face to face in clinical consultation, greater than 50% of which was counseling/coordinating care for anemia in the setting of CKD4, with consideration for bone marrow biopsy and aspiration.

## 2024-01-31 ENCOUNTER — Other Ambulatory Visit (HOSPITAL_COMMUNITY): Payer: Self-pay

## 2024-01-31 NOTE — Telephone Encounter (Signed)
 Pharmacy Patient Advocate Encounter  Received notification from [facility] that Prior Authorization for Amiodarone has been APPROVED from 01/30/24 to 12/05/24. Ran test claim, Copay is $14.23- One month. This test claim was processed through [facility]- copay amounts may vary at other pharmacies due to pharmacy/plan contracts, or as the patient moves through the different stages of their insurance plan.   PA #/Case ID/Reference #: [id number]

## 2024-02-01 NOTE — Telephone Encounter (Signed)
 Attempted to contact pt. Unable to leave VM as mailbox is not set up.

## 2024-02-03 ENCOUNTER — Encounter: Payer: Self-pay | Admitting: Family Medicine

## 2024-02-03 ENCOUNTER — Inpatient Hospital Stay: Payer: Medicare Other

## 2024-02-03 VITALS — BP 105/56

## 2024-02-03 DIAGNOSIS — N184 Chronic kidney disease, stage 4 (severe): Secondary | ICD-10-CM

## 2024-02-03 DIAGNOSIS — I129 Hypertensive chronic kidney disease with stage 1 through stage 4 chronic kidney disease, or unspecified chronic kidney disease: Secondary | ICD-10-CM | POA: Diagnosis not present

## 2024-02-03 DIAGNOSIS — D509 Iron deficiency anemia, unspecified: Secondary | ICD-10-CM

## 2024-02-03 LAB — HEMOGLOBIN AND HEMATOCRIT (CANCER CENTER ONLY)
HCT: 26.4 % — ABNORMAL LOW (ref 39.0–52.0)
Hemoglobin: 8.4 g/dL — ABNORMAL LOW (ref 13.0–17.0)

## 2024-02-03 MED ORDER — DARBEPOETIN ALFA 40 MCG/0.4ML IJ SOSY
60.0000 ug | PREFILLED_SYRINGE | INTRAMUSCULAR | Status: DC
  Administered 2024-02-03: 60 ug via SUBCUTANEOUS
  Filled 2024-02-03: qty 0.6

## 2024-02-07 ENCOUNTER — Encounter (HOSPITAL_COMMUNITY): Payer: Self-pay | Admitting: Oncology

## 2024-02-08 NOTE — Telephone Encounter (Signed)
 I attempted to contact the patient several times to ensure he was able to pick up the amiodarone. I was unable to leave a message as the mailbox was full. Nurse contacted [facility] and was informed that the patient's amiodarone had previously been denied. Nurse informed the pharmacy that a prior authorization had been submitted and approved from 01/30/24 to 12/05/24. The pharmacy processed the medication through insurance and confirmed that it was approved for the patient to fill.

## 2024-02-10 ENCOUNTER — Ambulatory Visit: Payer: Self-pay | Admitting: Urology

## 2024-02-10 ENCOUNTER — Other Ambulatory Visit: Payer: Self-pay

## 2024-02-10 VITALS — BP 116/61 | HR 85 | Ht 67.0 in | Wt 150.0 lb

## 2024-02-10 DIAGNOSIS — N401 Enlarged prostate with lower urinary tract symptoms: Secondary | ICD-10-CM

## 2024-02-10 DIAGNOSIS — R338 Other retention of urine: Secondary | ICD-10-CM

## 2024-02-10 DIAGNOSIS — D631 Anemia in chronic kidney disease: Secondary | ICD-10-CM

## 2024-02-10 LAB — SURGICAL PATHOLOGY

## 2024-02-10 NOTE — Progress Notes (Signed)
   02/10/24  CC:  Chief Complaint  Patient presents with   Cysto    HPI: Refer to [facility]'s note of 01/05/2024.  Has had recurrent urinary retention with urine volumes ~ 1500 mL.  Prostate volume calculated 65 cc from prior CT 08/20/2023  Blood pressure 116/61, pulse 85, height 5\' 7"  (1.702 m), weight 150 lb (68 kg). NED. A&Ox3.   No respiratory distress   Abd soft, NT, ND Normal phallus with bilateral descended testicles  Cystoscopy Procedure Note  Patient identification was confirmed, informed consent was obtained, and patient was prepped using Betadine solution.  Lidocaine jelly was administered per urethral meatus.     Pre-Procedure: - Inspection reveals a normal caliber urethral meatus.  Procedure: The flexible cystoscope was introduced without difficulty - No urethral strictures/lesions are present. -Prominent lateral lobe enlargement prostate  -Mild elevation bladder neck - Bilateral ureteral orifices identified - Bladder mucosa  reveals no ulcers, tumors, or lesions - No bladder stones - No trabeculation  Retroflexion shows suboptimal visualization secondary to urinary sediment however no definite intravesical median lobe or lesion identified   Post-Procedure: - Patient tolerated the procedure well  Assessment/ Plan: Prominent BPH Due to significant urine volumes he may have an element of bladder hypotonicity and we discussed a procedure may not resolve his retention.  We discussed that HoLEP has been effective in some cases of urinary retention with bladder hypotonicity.  We also discussed other options including TURP, UroLift and PAE The option of a suprapubic tube was also discussed He will think over these options.   Riki Altes, MD

## 2024-02-10 NOTE — Progress Notes (Signed)
 Cath Change/ Replacement  Patient is present today for a catheter change due to urinary retention. 8ml of water was removed from the balloon, a 18FR coude foley cath was removed without difficulty.  Patient was cleaned and prepped in a sterile fashion with betadine and 2% lidocaine jelly was instilled into the urethra. A 18FR coude foley cath was replaced into the bladder, no complications were noted. Urine return was noted and urine was clear in color. The balloon was filled with 10ml of sterile water. A leg bag was attached for drainage.   Patient was given proper instruction on catheter care.    Performed by: Debbe Bales, CMA (AAMA)  Follow up: as scheduled.

## 2024-02-17 ENCOUNTER — Inpatient Hospital Stay: Payer: Medicare Other

## 2024-02-17 ENCOUNTER — Inpatient Hospital Stay: Payer: Medicare Other | Attending: Oncology

## 2024-02-17 VITALS — BP 89/50

## 2024-02-17 DIAGNOSIS — N184 Chronic kidney disease, stage 4 (severe): Secondary | ICD-10-CM | POA: Insufficient documentation

## 2024-02-17 DIAGNOSIS — D631 Anemia in chronic kidney disease: Secondary | ICD-10-CM | POA: Diagnosis not present

## 2024-02-17 DIAGNOSIS — I129 Hypertensive chronic kidney disease with stage 1 through stage 4 chronic kidney disease, or unspecified chronic kidney disease: Secondary | ICD-10-CM | POA: Insufficient documentation

## 2024-02-17 LAB — HEMOGLOBIN AND HEMATOCRIT, BLOOD
HCT: 26.4 % — ABNORMAL LOW (ref 39.0–52.0)
Hemoglobin: 8.3 g/dL — ABNORMAL LOW (ref 13.0–17.0)

## 2024-02-17 MED ORDER — DARBEPOETIN ALFA 40 MCG/0.4ML IJ SOSY
60.0000 ug | PREFILLED_SYRINGE | INTRAMUSCULAR | Status: DC
  Administered 2024-02-17: 60 ug via SUBCUTANEOUS
  Filled 2024-02-17: qty 0.6

## 2024-02-20 ENCOUNTER — Encounter: Payer: Self-pay | Admitting: Urology

## 2024-02-26 ENCOUNTER — Encounter: Payer: Self-pay | Admitting: Oncology

## 2024-03-02 ENCOUNTER — Other Ambulatory Visit: Payer: Self-pay

## 2024-03-02 ENCOUNTER — Other Ambulatory Visit: Payer: Self-pay | Admitting: *Deleted

## 2024-03-02 ENCOUNTER — Inpatient Hospital Stay: Payer: Medicare Other

## 2024-03-02 ENCOUNTER — Encounter: Payer: Self-pay | Admitting: Oncology

## 2024-03-02 ENCOUNTER — Inpatient Hospital Stay: Payer: Medicare Other | Admitting: Oncology

## 2024-03-02 VITALS — BP 96/53 | HR 62 | Temp 98.6°F | Resp 18 | Ht 67.0 in | Wt 142.1 lb

## 2024-03-02 DIAGNOSIS — D649 Anemia, unspecified: Secondary | ICD-10-CM

## 2024-03-02 DIAGNOSIS — D631 Anemia in chronic kidney disease: Secondary | ICD-10-CM

## 2024-03-02 DIAGNOSIS — N183 Chronic kidney disease, stage 3 unspecified: Secondary | ICD-10-CM

## 2024-03-02 DIAGNOSIS — I129 Hypertensive chronic kidney disease with stage 1 through stage 4 chronic kidney disease, or unspecified chronic kidney disease: Secondary | ICD-10-CM | POA: Diagnosis not present

## 2024-03-02 DIAGNOSIS — Z79899 Other long term (current) drug therapy: Secondary | ICD-10-CM

## 2024-03-02 LAB — SAMPLE TO BLOOD BANK

## 2024-03-02 LAB — HEMOGLOBIN AND HEMATOCRIT, BLOOD
HCT: 28.8 % — ABNORMAL LOW (ref 39.0–52.0)
Hemoglobin: 9.1 g/dL — ABNORMAL LOW (ref 13.0–17.0)

## 2024-03-02 MED ORDER — DARBEPOETIN ALFA 40 MCG/0.4ML IJ SOSY
70.0000 ug | PREFILLED_SYRINGE | INTRAMUSCULAR | Status: DC
  Administered 2024-03-02: 70 ug via SUBCUTANEOUS
  Filled 2024-03-02: qty 0.7

## 2024-03-02 NOTE — Progress Notes (Addendum)
 Hematology/Oncology Consult note [facility]  [phone] [phone]  Patient Care Team: Donal Channing SQUIBB, FNP as PCP - General (Family Medicine) End, Lonni, MD as PCP - Cardiology (Cardiology) Melanee Annah BROCKS, MD as Consulting Physician (Oncology)   Name of the patient: Rodney Wiley  [id number]  1936/04/01   Date of visit: 03/02/24  Diagnosis-anemia of chronic kidney disease  Chief complaint/ Reason for visit-routine follow-up of anemia of chronic kidney disease  Heme/Onc history:  patient is a 88 year old male with a past medical history significant for hypertension, hyperlipidemia, stage III CKD and history of aortic insufficiency as well as ascending aortic aneurysm.  He recently underwent bioprosthetic aortic valve replacement, CABG x3 and resuspension of the left atrium on 02/22/2018.    Results of blood work in June 2021 were consistent with iron  deficiency along with CKD. He received venofer  in July 2021.  He has not required any EPO injections so far   Patient's hemoglobin was stable between 10-11 up until August 2024.  He had 2 back-to-back hospitalizations for syncope, hyponatremia and nausea vomiting.  Following that his hemoglobin has been around 8.  Patient was started on Aranesp  and presently gets it every 2 weeks.      Interval history-patient is doing well overall for his age.  No recent hospitalizations.  Tolerating Depo well without any significant side effects  ECOG PS- 2 Pain scale- 0   Review of systems- Review of Systems  Constitutional:  Negative for chills, fever, malaise/fatigue and weight loss.  HENT:  Negative for congestion, ear discharge and nosebleeds.   Eyes:  Negative for blurred vision.  Respiratory:  Negative for cough, hemoptysis, sputum production, shortness of breath and wheezing.   Cardiovascular:  Negative for chest pain, palpitations, orthopnea and claudication.  Gastrointestinal:   Negative for abdominal pain, blood in stool, constipation, diarrhea, heartburn, melena, nausea and vomiting.  Genitourinary:  Negative for dysuria, flank pain, frequency, hematuria and urgency.  Musculoskeletal:  Negative for back pain, joint pain and myalgias.  Skin:  Negative for rash.  Neurological:  Negative for dizziness, tingling, focal weakness, seizures, weakness and headaches.  Endo/Heme/Allergies:  Does not bruise/bleed easily.  Psychiatric/Behavioral:  Negative for depression and suicidal ideas. The patient does not have insomnia.       No Known Allergies   Past Medical History:  Diagnosis Date   Anemia    Aortic insufficiency    a. 02/2018 s/p bioprosthetic AVR; 04/2018 Echo: EF 50-55%, Gr2 DD, Ao bioprosthesis, mean grad , Ao root/Asc Ao nl in size, mild MR, mildly dil LA, nl RV fxn. Nl PASP.   Arthropathy    Ascending aortic aneurysm (HCC)    a. 12/2016 MRA: 4.3cm @ sinus of valsalva, 4.9cm above sinotubular jxn; b. 02/2018 s/p biological Bentall and AVR.   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 02/2018 Cath: LAD 20ost, 40p, 13m, LCX 60p, OM2 60, OM3 85, RCA 10p/m w/ L->R and R->R collats;  b. 02/2018 CABG x 3: LIMA to LAD, SVG to OM2, SVG to PDA, EVH via right thigh.   CKD (chronic kidney disease), stage III (HCC)    Diverticulitis    Essential hypertension    GERD (gastroesophageal reflux disease)    Hemorrhoids    History of chicken pox    History of Helicobacter pylori infection 06/2007   Hyperlipidemia    Hypertension    Left renal artery stenosis (HCC)  a. 01/2017 RA u/s: moderate L RAS.   Lower extremity edema    Nonrheumatic mitral (valve) insufficiency    a. 12/2016 Echo: mild to mod MR; b. 09/2017 TEE: mild to mod MR; c. 04/2018 Echo: Mild MR.   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 s/p 21 mm Edwards  Inspiris Resilia stented bovine pericardial tissue valve and 24 mm Gelweave Valsalva synthetic root conduit with reimplantation of left main coronary artery.     Past Surgical History:  Procedure Laterality Date   AORTIC VALVE REPAIR N/A 02/22/2018   Procedure: AORTIC VALVE REPLACEMENT -Biological Bentall aortic root replacement,;  Surgeon: Dusty Sudie DEL, MD;  Location: [facility] OR;  Service: Open Heart Surgery;  Laterality: N/A;   bypass surgery     CARDIAC CATHETERIZATION     01/16/2018   COLONOSCOPY  2011   by Dr. Ora with findings of diverticulosis   CORONARY ARTERY BYPASS GRAFT N/A 02/22/2018   Procedure: CORONARY ARTERY BYPASS GRAFTING (CABG) x three, using left internal mammary artery and right leg greater saphenous vein harvested endoscopically;  Surgeon: Dusty Sudie DEL, MD;  Location[facility] OR;  Service: Open Heart Surgery;  Laterality: N/A;   CORONARY/GRAFT ANGIOGRAPHY N/A 01/16/2018   Procedure: CORONARY/GRAFT ANGIOGRAPHY;  Surgeon: Mady Bruckner, MD;  Location: [facility] INVASIVE CV LAB;  Service: Cardiovascular;  Laterality: N/A;   ELBOW SURGERY Left    hemorhoidectomy     IR BONE MARROW BIOPSY & ASPIRATION  01/30/2024   RIGHT HEART CATH N/A 01/16/2018   Procedure: RIGHT HEART CATH;  Surgeon: Mady Bruckner, MD;  Location: [facility] INVASIVE CV LAB;  Service: Cardiovascular;  Laterality: N/A;   SHOULDER ARTHROSCOPY WITH ROTATOR CUFF REPAIR Right    SHOULDER SURGERY Right    TEE WITHOUT CARDIOVERSION N/A 09/27/2017   Procedure: TRANSESOPHAGEAL ECHOCARDIOGRAM (TEE);  Surgeon: Maranda Leim DEL, MD;  Location: [facility] ENDOSCOPY;  Service: Cardiovascular;  Laterality: N/A;   TEE WITHOUT CARDIOVERSION N/A 02/22/2018   Procedure: TRANSESOPHAGEAL ECHOCARDIOGRAM (TEE);  Surgeon: Dusty Sudie DEL, MD;  Location: [facility] OR;  Service: Open Heart Surgery;  Laterality: N/A;   THORACIC AORTIC ANEURYSM REPAIR N/A 02/22/2018   Procedure: THORACIC ASCENDING ANEURYSM REPAIR (AAA) Resection of ascending aorta aneurysm;   Surgeon: Dusty Sudie DEL, MD;  Location: [facility] OR;  Service: Open Heart Surgery;  Laterality: N/A;    Social History   Socioeconomic History   Marital status: Widowed    Spouse name: Not on file   Number of children: Not on file   Years of education: Not on file   Highest education level: Not on file  Occupational History   Not on file  Tobacco Use   Smoking status: Never   Smokeless tobacco: Never  Vaping Use   Vaping status: Never Used  Substance and Sexual Activity   Alcohol  use: No   Drug use: No   Sexual activity: Not Currently  Other Topics Concern   Not on file  Social History Narrative   Not on file   Social Drivers of Health   Financial Resource Strain: Patient Declined (09/01/2023)   Received from [facility]   Overall Financial Resource Strain (CARDIA)    Difficulty of Paying Living Expenses: Patient declined  Food Insecurity: No Food Insecurity (09/01/2023)   Received from [facility]   Hunger Vital Sign    Worried About Running Out of Food in the Last Year: Never  true    Ran Out of Food in the Last Year: Never true  Transportation Needs: No Transportation Needs (09/01/2023)   [facility] - [facility]    Lack of Transportation (Medical): No    Lack of Transportation (Non-Medical): No  Physical Activity: Not on file  Stress: Not on file  Social Connections: Not on file  Intimate Partner Violence: Not At Risk (07/26/2023)   Humiliation, Afraid, Rape, and Kick questionnaire    Fear of Current or Ex-Partner: No    Emotionally Abused: No    Physically Abused: No    Sexually Abused: No    Family History  Problem Relation Age of Onset   Hypertension Mother    Stroke Mother    Leukemia Father    Heart attack Paternal Grandmother    Stroke Paternal Grandfather    Heart Problems Son 66   Heart Problems Son 85   Uterine cancer Sister    Prostate cancer Brother    Colon cancer Brother    Kidney failure Brother     Stroke Sister      Current Outpatient Medications:    amiodarone  (PACERONE ) 100 MG tablet, TAKE 1 TABLET BY MOUTH EVERY DAY, Disp: 90 tablet, Rfl: 0   amLODipine  (NORVASC ) 10 MG tablet, Take 5 mg by mouth daily., Disp: , Rfl:    aspirin  EC 81 MG tablet, Take 81 mg by mouth daily., Disp: , Rfl:    atorvastatin  (LIPITOR) 40 MG tablet, TAKE 1 TABLET BY MOUTH EVERY DAY, Disp: 90 tablet, Rfl: 0   cholecalciferol (VITAMIN D3) 25 MCG (1000 UT) tablet, Take 1,000 Units by mouth daily., Disp: , Rfl:    gabapentin  (NEURONTIN ) 100 MG capsule, Take 100 mg by mouth at bedtime., Disp: , Rfl:    hydrALAZINE  (APRESOLINE ) 50 MG tablet, Take 50 mg by mouth 2 (two) times daily., Disp: , Rfl:    isosorbide  mononitrate (IMDUR ) 60 MG 24 hr tablet, TAKE 1 TABLET BY MOUTH EVERY DAY, Disp: 90 tablet, Rfl: 0   levothyroxine (SYNTHROID) 25 MCG tablet, Take 25 mcg by mouth every morning., Disp: , Rfl:    lisinopril  (ZESTRIL ) 5 MG tablet, Take 1 tablet (5 mg total) by mouth daily., Disp: 90 tablet, Rfl: 3   nitroGLYCERIN  (NITROSTAT ) 0.4 MG SL tablet, Place 1 tablet (0.4 mg total) under the tongue every 5 (five) minutes as needed for chest pain. Maximum of 3 doses., Disp: 25 tablet, Rfl: 2   ondansetron  (ZOFRAN ) 4 MG tablet, Take 1 tablet (4 mg total) by mouth every 6 (six) hours as needed for nausea., Disp: 20 tablet, Rfl: 0   oxyCODONE  (OXY IR/ROXICODONE ) 5 MG immediate release tablet, Take 5 mg by mouth every 6 (six) hours as needed., Disp: , Rfl:    pantoprazole  (PROTONIX ) 40 MG tablet, Take 1 tablet (40 mg total) by mouth daily., Disp: 30 tablet, Rfl: 1   tamsulosin  (FLOMAX ) 0.4 MG CAPS capsule, Take 1 capsule (0.4 mg total) by mouth daily., Disp: 30 capsule, Rfl: 11   torsemide  (DEMADEX ) 10 MG tablet, TAKE 1 TABLET BY MOUTH EVERY DAY AS NEEDED, Disp: 90 tablet, Rfl: 0   traMADol  (ULTRAM ) 50 MG tablet, Take 50 mg by mouth every 6 (six) hours as needed., Disp: , Rfl:  No current facility-administered medications for  this visit.  Facility-Administered Medications Ordered in Other Visits:    Darbepoetin Alfa  (ARANESP ) injection 70 mcg, 70 mcg, Subcutaneous, Q14 Days, Melanee Annah BROCKS, MD, 70 mcg at 03/02/24  1407  Physical exam:  Vitals:   03/02/24 1332  BP: (!) 96/53  Pulse: 62  Resp: 18  Temp: 98.6 F (37 C)  TempSrc: Tympanic  SpO2: 100%  Weight: 142 lb 1.6 oz (64.5 kg)  Height: 5' 7 (1.702 m)   Physical Exam Cardiovascular:     Rate and Rhythm: Normal rate and regular rhythm.     Heart sounds: Normal heart sounds.  Pulmonary:     Effort: Pulmonary effort is normal.     Breath sounds: Normal breath sounds.  Skin:    General: Skin is warm and dry.  Neurological:     Mental Status: He is alert and oriented to person, place, and time.         Latest Ref Rng & Units 01/20/2024    1:53 PM  CMP  Glucose 70 - 99 mg/dL 898   BUN 8 - 23 mg/dL 33   Creatinine 9.38 - 1.24 mg/dL 8.10   Sodium 864 - 854 mmol/L 138   Potassium 3.5 - 5.1 mmol/L 3.9   Chloride 98 - 111 mmol/L 107   CO2 22 - 32 mmol/L 22   Calcium  8.9 - 10.3 mg/dL 8.3       Latest Ref Rng & Units 03/02/2024    1:05 PM  CBC  Hemoglobin 13.0 - 17.0 g/dL 9.1   Hematocrit 60.9 - 52.0 % 28.8     No images are attached to the encounter.  No results found.   Assessment and plan- Patient is a 88 y.o. male here to discuss bone marrow biopsy results and to receive EPO for anemia of chronic kidney disease    Results of bone marrow biopsy from 01/30/2024 did not show any evidence of primary bone marrow disorder.  Hypercellular bone marrow with trilineage hematopoiesis with orderly maturation all 3 cell lines without overt dysplasia.  Blasts were not increased.  No evidence of ring sideroblasts.  Storage iron  present.  Cytogenetics showed loss of Y chromosome.    He has not had a great response to EPO despite receiving 60,000 units q. 3 weeks.  Today his hemoglobin is 9.1.  I will plan to increase his aranesp  dosing to 70 mcg  every  2 weeks.CBC and iron  studies  in 3 and 6 1 we will see him back in 6 months   Visit Diagnosis 1. Erythropoietin (EPO) stimulating agent anemia management patient   2. Anemia of chronic kidney failure, stage 3 (moderate) (HCC)      Dr. Annah Skene, MD, MPH [facility] at [facility] [phone] 03/02/2024 4:19 PM

## 2024-03-03 ENCOUNTER — Encounter: Payer: Self-pay | Admitting: Oncology

## 2024-03-05 ENCOUNTER — Encounter: Payer: Self-pay | Admitting: *Deleted

## 2024-03-05 ENCOUNTER — Other Ambulatory Visit: Payer: Self-pay | Admitting: *Deleted

## 2024-03-05 NOTE — Progress Notes (Unsigned)
 03/06/2024 3:22 PM   Rodney Wiley 1936/04/13 [id number]  Referring provider: Armando Gang, FNP [address] [place],  [place] [address]  Urological history: 1. Elevated PSA -PSA (01/2023) 27.4 -prostate MRI (03/2023) negative   2. BPH with LU TS -prostate volume (MRI 2024) - 50 cc  -cysto (02/2024) - prominent lateral lobe enlargement, mild elevation of bladder neck  3. Renal cysts  -non contrast CT (08/2023) - Kidneys show bilateral simple appearing cysts.  Chief Complaint  Patient presents with   Follow-up   HPI: Rodney Wiley is a 88 y.o. male who presents today for discussion on how to move forward with urinary retention issues with his daughter, Rodney Wiley.  Previous records reviewed.   He has been okay with the Foley catheter, but he would like to discuss other options for his urinary retention.     PMH: Past Medical History:  Diagnosis Date   Anemia    Aortic insufficiency    a. 02/2018 s/p bioprosthetic AVR; 04/2018 Echo: EF 50-55%, Gr2 DD, Ao bioprosthesis, mean grad , Ao root/Asc Ao nl in size, mild MR, mildly dil LA, nl RV fxn. Nl PASP.   Arthropathy    Ascending aortic aneurysm (HCC)    a. 12/2016 MRA: 4.3cm @ sinus of valsalva, 4.9cm above sinotubular jxn; b. 02/2018 s/p biological Bentall and AVR.   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 02/2018 Cath: LAD 20ost, 40p, 68m, LCX 60p, OM2 60, OM3 85, RCA 10p/m w/ L->R and R->R collats;  b. 02/2018 CABG x 3: LIMA to LAD, SVG to OM2, SVG to PDA, EVH via right thigh.   CKD (chronic kidney disease), stage III (HCC)    Diverticulitis    Essential hypertension    GERD (gastroesophageal reflux disease)    Hemorrhoids    History of chicken pox    History of Helicobacter pylori infection 06/2007   Hyperlipidemia    Hypertension    Left renal artery stenosis (HCC)    a. 01/2017 RA u/s: moderate L RAS.   Lower extremity edema    Nonrheumatic mitral (valve)  insufficiency    a. 12/2016 Echo: mild to mod MR; b. 09/2017 TEE: mild to mod MR; c. 04/2018 Echo: Mild MR.   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 s/p 21 mm Edwards Inspiris Resilia stented bovine pericardial tissue valve and 24 mm Gelweave Valsalva synthetic root conduit with reimplantation of left main coronary artery.    Surgical History: Past Surgical History:  Procedure Laterality Date   AORTIC VALVE REPAIR N/A 02/22/2018   Procedure: AORTIC VALVE REPLACEMENT -Biological Bentall aortic root replacement,;  Surgeon: Purcell Nails, MD;  Location: [facility] OR;  Service: Open Heart Surgery;  Laterality: N/A;   bypass surgery     CARDIAC CATHETERIZATION     01/16/2018   COLONOSCOPY  2011   by Dr. Bluford Kaufmann with findings of diverticulosis   CORONARY ARTERY BYPASS GRAFT N/A 02/22/2018   Procedure: CORONARY ARTERY BYPASS GRAFTING (CABG) x three, using left internal mammary artery and right leg greater saphenous vein harvested endoscopically;  Surgeon: Purcell Nails, MD;  Location: [facility] OR;  Service: Open Heart Surgery;  Laterality: N/A;   CORONARY/GRAFT ANGIOGRAPHY N/A 01/16/2018   Procedure: CORONARY/GRAFT ANGIOGRAPHY;  Surgeon: Yvonne Kendall, MD;  Location: [facility] INVASIVE CV LAB;  Service: Cardiovascular;  Laterality: N/A;  ELBOW SURGERY Left    hemorhoidectomy     IR BONE MARROW BIOPSY & ASPIRATION  01/30/2024   RIGHT HEART CATH N/A 01/16/2018   Procedure: RIGHT HEART CATH;  Surgeon: Yvonne Kendall, MD;  Location: [facility] INVASIVE CV LAB;  Service: Cardiovascular;  Laterality: N/A;   SHOULDER ARTHROSCOPY WITH ROTATOR CUFF REPAIR Right    SHOULDER SURGERY Right    TEE WITHOUT CARDIOVERSION N/A 09/27/2017   Procedure: TRANSESOPHAGEAL ECHOCARDIOGRAM (TEE);  Surgeon: Lars Masson, MD;  Location: [facility] ENDOSCOPY;  Service: Cardiovascular;  Laterality: N/A;   TEE WITHOUT CARDIOVERSION  N/A 02/22/2018   Procedure: TRANSESOPHAGEAL ECHOCARDIOGRAM (TEE);  Surgeon: Purcell Nails, MD;  Location: [facility] OR;  Service: Open Heart Surgery;  Laterality: N/A;   THORACIC AORTIC ANEURYSM REPAIR N/A 02/22/2018   Procedure: THORACIC ASCENDING ANEURYSM REPAIR (AAA) Resection of ascending aorta aneurysm;  Surgeon: Purcell Nails, MD;  Location: [facility] OR;  Service: Open Heart Surgery;  Laterality: N/A;    Home Medications:  Allergies as of 03/06/2024   No Known Allergies      Medication List        Accurate as of March 06, 2024  3:22 PM. If you have any questions, ask your nurse or doctor.          STOP taking these medications    gabapentin 100 MG capsule Commonly known as: NEURONTIN Stopped by: Michiel Cowboy   levothyroxine 25 MCG tablet Commonly known as: SYNTHROID Stopped by: Calden Dorsey   nitroGLYCERIN 0.4 MG SL tablet Commonly known as: Nitrostat Stopped by: [place] Welcome   ondansetron 4 MG tablet Commonly known as: ZOFRAN Stopped by: Azara Gemme       TAKE these medications    amiodarone 100 MG tablet Commonly known as: PACERONE TAKE 1 TABLET BY MOUTH EVERY DAY   amLODipine 10 MG tablet Commonly known as: NORVASC Take 5 mg by mouth daily.   aspirin EC 81 MG tablet Take 81 mg by mouth daily.   atorvastatin 40 MG tablet Commonly known as: LIPITOR TAKE 1 TABLET BY MOUTH EVERY DAY   cholecalciferol 25 MCG (1000 UNIT) tablet Commonly known as: VITAMIN D3 Take 1,000 Units by mouth daily.   hydrALAZINE 50 MG tablet Commonly known as: APRESOLINE Take 50 mg by mouth 2 (two) times daily.   isosorbide mononitrate 60 MG 24 hr tablet Commonly known as: IMDUR TAKE 1 TABLET BY MOUTH EVERY DAY   lisinopril 5 MG tablet Commonly known as: ZESTRIL Take 1 tablet (5 mg total) by mouth daily.   oxyCODONE 5 MG immediate release tablet Commonly known as: Oxy IR/ROXICODONE Take 5 mg by mouth every 6 (six) hours as needed.   pantoprazole 40 MG  tablet Commonly known as: Protonix Take 1 tablet (40 mg total) by mouth daily.   tamsulosin 0.4 MG Caps capsule Commonly known as: FLOMAX Take 1 capsule (0.4 mg total) by mouth daily.   torsemide 10 MG tablet Commonly known as: DEMADEX TAKE 1 TABLET BY MOUTH EVERY DAY AS NEEDED   traMADol 50 MG tablet Commonly known as: ULTRAM Take 50 mg by mouth every 6 (six) hours as needed.        Allergies: No Known Allergies  Family History: Family History  Problem Relation Age of Onset   Hypertension Mother    Stroke Mother    Leukemia Father    Heart attack Paternal Grandmother    Stroke Paternal Grandfather    Heart Problems Son 71   Heart Problems Son 62  Uterine cancer Sister    Prostate cancer Brother    Colon cancer Brother    Kidney failure Brother    Stroke Sister     Social History:  reports that he has never smoked. He has never used smokeless tobacco. He reports that he does not drink alcohol and does not use drugs.  ROS: Pertinent ROS in HPI  Physical Exam:    03/06/2024    2:25 PM 03/02/2024    1:32 PM 02/17/2024    1:00 PM  Vitals with BMI  Height 5\' 7"  5\' 7"    Weight 142 lbs 142 lbs 2 oz   BMI 22.24 22.25   Systolic 103 96 89  Diastolic 56 53 50  Pulse 42 62    Constitutional:  Well nourished. Alert and oriented, No acute distress. HEENT: [place] AT, moist mucus membranes.  Trachea midline Cardiovascular: No clubbing, cyanosis, or edema. Respiratory: Normal respiratory effort, no increased work of breathing. GU: No CVA tenderness.  No bladder fullness or masses.  Patient with uncircumcised phallus. Foreskin easily retracted  Urethral meatus is patent.  No penile discharge. No penile lesions or rashes.  Neurologic: Grossly intact, no focal deficits, moving all 4 extremities. Psychiatric: Normal mood and affect.   Laboratory Data: N/A  Pertinent Imaging: N/A   Cath Change/ Replacement Patient is present today for a catheter change due to urinary  retention.  8 ml of water was removed from the balloon, a 18 FR Coude foley cath was removed without difficulty.  Patient was cleaned and prepped in a sterile fashion with betadine and 2% lidocaine jelly was instilled into the urethra. A 18 FR Coude foley cath was replaced into the bladder, no complications were noted.   The catheter was hubbed.  The balloon was filled with 10ml of sterile water. A leg bag was attached for drainage.  Patient was given proper instruction on catheter care.    Performed by: Michiel Cowboy, PA-C and Benay Pike, CMA   1. Urinary retention -currently managed with indwelling Foley -changed today  2. BPH with LU TS -continue tamsulosin 0.4 mg daily  -We discussed treatment options going forward such as keeping an indwelling Foley and exchanging monthly, converting it to a suprapubic tube to be changed monthly, PAE, UroLift, TURP and HoLEP -I explained with the indwelling Foley there is a possibility of urethral erosion -I explained that with the indwelling Foley and the suprapubic tube there is always the possibility of increased risk of infection -I explained that both the indwelling Foley and suprapubic tube will be changed monthly -I explained that an SPT is placed by interventional radiology and may sometimes require serial visits to dilate up to 16 French catheter that can be exchanged in the office -I explained with PAE that it may take 3 to 6 months to experience any symptom relief -I explained with the UroLift, TURP and HoLEP he may not have resolution of his urinary retention due to bladder hypotonicity -We discussed pursuing urodynamic studies to further evaluate his bladder function and while even with urodynamic studies there is no guarantee that having a minimally invasive therapy would resolve his retention issues, if the urodynamic studies indicate the bladder is hypotonic, we may not want to consider any outlet procedure -I explained how the  urodynamic studies are performed to place referral for [facility]   3. Elevated PSA  -May consider repeating PSA at some point  Return in about 1 month (around 04/05/2024) for Foley exchange.  These  notes generated with voice recognition software. I apologize for typographical errors.  Cloretta Ned  [facility] [address]  [address] [place], [place] [address] [phone]

## 2024-03-06 ENCOUNTER — Encounter: Payer: Self-pay | Admitting: Urology

## 2024-03-06 ENCOUNTER — Ambulatory Visit (INDEPENDENT_AMBULATORY_CARE_PROVIDER_SITE_OTHER): Admitting: Urology

## 2024-03-06 VITALS — BP 103/56 | HR 42 | Ht 67.0 in | Wt 142.0 lb

## 2024-03-06 DIAGNOSIS — N401 Enlarged prostate with lower urinary tract symptoms: Secondary | ICD-10-CM | POA: Diagnosis not present

## 2024-03-06 DIAGNOSIS — R972 Elevated prostate specific antigen [PSA]: Secondary | ICD-10-CM

## 2024-03-06 DIAGNOSIS — R338 Other retention of urine: Secondary | ICD-10-CM | POA: Diagnosis not present

## 2024-03-07 ENCOUNTER — Encounter: Payer: Self-pay | Admitting: Oncology

## 2024-03-19 ENCOUNTER — Encounter (HOSPITAL_COMMUNITY): Payer: Self-pay | Admitting: Oncology

## 2024-03-23 ENCOUNTER — Inpatient Hospital Stay: Payer: Self-pay

## 2024-03-23 ENCOUNTER — Inpatient Hospital Stay: Payer: Self-pay | Attending: Oncology

## 2024-04-05 ENCOUNTER — Other Ambulatory Visit
Admission: RE | Admit: 2024-04-05 | Discharge: 2024-04-05 | Disposition: A | Discharge status: Home / Self Care | Attending: Internal Medicine | Admitting: Internal Medicine

## 2024-04-05 ENCOUNTER — Ambulatory Visit: Admitting: Urology

## 2024-04-05 VITALS — BP 143/67 | HR 60

## 2024-04-05 DIAGNOSIS — Z79899 Other long term (current) drug therapy: Secondary | ICD-10-CM | POA: Insufficient documentation

## 2024-04-05 DIAGNOSIS — R339 Retention of urine, unspecified: Secondary | ICD-10-CM

## 2024-04-05 LAB — COMPREHENSIVE METABOLIC PANEL WITH GFR
ALT: 13 U/L (ref 0–44)
AST: 18 U/L (ref 15–41)
Albumin: 3.6 g/dL (ref 3.5–5.0)
Alkaline Phosphatase: 45 U/L (ref 38–126)
Anion gap: 7 (ref 5–15)
BUN: 46 mg/dL — ABNORMAL HIGH (ref 8–23)
CO2: 18 mmol/L — ABNORMAL LOW (ref 22–32)
Calcium: 8.3 mg/dL — ABNORMAL LOW (ref 8.9–10.3)
Chloride: 107 mmol/L (ref 98–111)
Creatinine, Ser: 2.48 mg/dL — ABNORMAL HIGH (ref 0.61–1.24)
GFR, Estimated: 25 mL/min — ABNORMAL LOW (ref >60)
Glucose, Bld: 102 mg/dL — ABNORMAL HIGH (ref 70–99)
Potassium: 4.2 mmol/L (ref 3.5–5.1)
Sodium: 132 mmol/L — ABNORMAL LOW (ref 135–145)
Total Bilirubin: 0.8 mg/dL (ref 0.0–1.2)
Total Protein: 6.4 g/dL — ABNORMAL LOW (ref 6.5–8.1)

## 2024-04-05 LAB — TSH: TSH: 3.852 u[IU]/mL (ref 0.350–4.500)

## 2024-04-05 NOTE — Progress Notes (Signed)
 Cath Change/ Replacement  Patient is present today for a catheter change due to urinary retention.  8 ml of water was removed from the balloon, a 18 FR Coude foley cath was removed without difficulty.  Patient was cleaned and prepped in a sterile fashion with betadine and 2% lidocaine  jelly was instilled into the urethra. A 18 FR Coude foley cath was replaced into the bladder, no complications were noted. Urine return was noted 30 ml and urine was yellow in color. The balloon was filled with 10ml of sterile water.  A leg bag was attached for drainage.  Patient was given proper instruction on catheter care.    Performed by: Matilde Son, PA-C   Follow up: Return in about 1 month (around 05/06/2024) for Foley exchange .    We discussed suprapubic placement today and he is wanting to proceed.  I explained that this procedure was performed by interventional radiologist and what typically happens is they put in a pigtail catheter initially for 30 days and then bring the patient back to put a council tip Foley catheter in place and then again in 30 days to put a regular Foley in place and then after that the suprapubic tube exchanges will happen with us .  He is in agreement and wishes to proceed.

## 2024-04-06 ENCOUNTER — Encounter: Payer: Self-pay | Admitting: General Surgery

## 2024-04-06 ENCOUNTER — Other Ambulatory Visit: Payer: Self-pay

## 2024-04-06 DIAGNOSIS — N184 Chronic kidney disease, stage 4 (severe): Secondary | ICD-10-CM

## 2024-04-13 ENCOUNTER — Inpatient Hospital Stay: Payer: Self-pay | Attending: Oncology

## 2024-04-13 ENCOUNTER — Inpatient Hospital Stay: Payer: Self-pay

## 2024-04-13 VITALS — BP 141/69

## 2024-04-13 DIAGNOSIS — I129 Hypertensive chronic kidney disease with stage 1 through stage 4 chronic kidney disease, or unspecified chronic kidney disease: Secondary | ICD-10-CM | POA: Insufficient documentation

## 2024-04-13 DIAGNOSIS — N184 Chronic kidney disease, stage 4 (severe): Secondary | ICD-10-CM | POA: Insufficient documentation

## 2024-04-13 DIAGNOSIS — D631 Anemia in chronic kidney disease: Secondary | ICD-10-CM | POA: Diagnosis not present

## 2024-04-13 LAB — HEMOGLOBIN AND HEMATOCRIT, BLOOD
HCT: 27.8 % — ABNORMAL LOW (ref 39.0–52.0)
Hemoglobin: 9.2 g/dL — ABNORMAL LOW (ref 13.0–17.0)

## 2024-04-13 MED ORDER — DARBEPOETIN ALFA 40 MCG/0.4ML IJ SOSY
70.0000 ug | PREFILLED_SYRINGE | INTRAMUSCULAR | Status: DC
  Administered 2024-04-13: 70 ug via SUBCUTANEOUS
  Filled 2024-04-13: qty 0.7

## 2024-04-25 ENCOUNTER — Encounter: Payer: Self-pay | Admitting: Oncology

## 2024-04-25 NOTE — Progress Notes (Signed)
 Cardiology Office Note:  .   Date:  04/28/2024  ID:  Rodney Wiley, DOB May 06, 1936, MRN [id number] PCP: Sharyne Degree, FNP  [facility] Providers Cardiologist:  Sammy Crisp, MD     History of Present Illness: .   Rodney Wiley is a 88 y.o. male with history of coronary artery disease status post CABG (02/2018), thoracic aortic aneurysm and aortic regurgitation status post Bentall procedure with bioprosthetic aortic valve replacement (02/2018), chronic HFpEF, SVT and frequent PACs, resistant hypertension, and chronic kidney disease stage IV with question of left renal artery stenosis who presents for follow-up of coronary artery disease, hypertension, and aortic/aortic valve disease.  I last saw him in February, which time he was doing well other than mild lower extremity edema that have worsened during a preceding episode of urinary retention and associated worsening renal failure.  We agreed to switch from enalapril  to lisinopril  due to soft blood pressures and noticed from his insurance that enalapril  was no longer on formulary.  Follow-up CMP earlier this month showed slight interval decline in his renal function.  Today, Rodney Wiley reports feeling fairly well, though he still has a Foley catheter in place.  He is scheduled with urology to discuss other options, such as suprapubic tube, for management of his urinary retention.  He denies chest pain, shortness of breath, palpitations, and lightheadedness.  He has waxing and waning leg edema and uses torsemide  a few days a week.  His mobility is fairly limited due to left knee pain.  ROS: See HPI  Studies Reviewed: Aaron Aas   EKG Interpretation Date/Time:  Friday Apr 27 2024 08:41:40 EDT Ventricular Rate:  48 PR Interval:  344 QRS Duration:  94 QT Interval:  486 QTC Calculation: 434 R Axis:   -71  Text Interpretation: Sinus bradycardia with 1st degree A-V block Left axis deviation Minimal voltage criteria for LVH, may be normal  variant ( Cornell product ) Inferior infarct (cited on or before 27-Oct-2023) Anteroseptal infarct (cited on or before 27-Oct-2023) When compared with ECG of 27-Oct-2023 10:00, Vent. rate has decreased BY  32 BPM Confirmed by Donyea Gafford [phone]) on 04/28/2024 4:43:24 PM    Risk Assessment/Calculations:             Physical Exam:   VS:  BP (!) 102/52 (BP Location: Left Arm, Patient Position: Sitting, Cuff Size: Normal)   Pulse (!) 48   Ht 5\' 7"  (1.702 m)   Wt 142 lb (64.4 kg)   SpO2 98%   BMI 22.24 kg/m    Wt Readings from Last 3 Encounters:  04/27/24 142 lb (64.4 kg)  03/06/24 142 lb (64.4 kg)  03/02/24 142 lb 1.6 oz (64.5 kg)    General:  NAD. Neck: No JVD or HJR. Lungs: Clear to auscultation bilaterally without wheezes or crackles. Heart: Bradycardic but regular rhythm with 3/6 systolic murmur. Abdomen: Soft, nontender, nondistended. Extremities: Trace pretibial edema bilaterally.  ASSESSMENT AND PLAN: .    Coronary artery disease: No angina reported.  Continue amlodipine  and isosorbide  mononitrate for BP control and antianginal therapy, as well as aspirin  and atorvastatin  for secondary prevention.  Chronic HFpEF: Volume status appears stable with trace pretibial edema noted on exam today.  Rodney Wiley is using torsemide  on a PRN basis for edema/weight gain.  Defer SGLT2 inhibitor with urinary retention and indwelling Foley catheter, which places him at high risk for UTI.  Hypertension: BP actually borderline low today.  We have agreed to stop hydralazine  today  but continue the remainder of his medications.  Thoracic aortic aneurysm and aortic regurgitation s/p bio-Bentall procedure: No signs/symptoms of valve dysfunction; chronic systolic murmur again noted.  Defer further testing at this time; continue appropriate SBE prophylaxis.  PSVT: No palpitations reported.  EKG today shows sinus bradycardia with significant PR prolongation (1st degree AV block similar to last  tracing, though HR is notably lower).  Given lack of symptoms, we will continue low-dose amiodarone .  However, if HR drops further and/or symptoms develop, discontinuation of amiodarone  and EP consultation will need to be considered as Rodney Wiley is at high risk for progression of his conduction disease.  Chronic kidney disease stage 4 and urinary retention: Relatively stable with ongoing urinary retention.  He will continue to follow with urology and should have follow-up BMP done at next office visit (if not done by PCP in the meantime).    Dispo: Return to clinic in 1 month with APP.  Signed, Sammy Crisp, MD

## 2024-04-27 ENCOUNTER — Ambulatory Visit: Payer: Medicare Other | Attending: Internal Medicine | Admitting: Internal Medicine

## 2024-04-27 VITALS — BP 102/52 | HR 48 | Ht 67.0 in | Wt 142.0 lb

## 2024-04-27 DIAGNOSIS — I5032 Chronic diastolic (congestive) heart failure: Secondary | ICD-10-CM

## 2024-04-27 DIAGNOSIS — I10 Essential (primary) hypertension: Secondary | ICD-10-CM | POA: Diagnosis not present

## 2024-04-27 DIAGNOSIS — I7121 Aneurysm of the ascending aorta, without rupture: Secondary | ICD-10-CM | POA: Diagnosis not present

## 2024-04-27 DIAGNOSIS — R339 Retention of urine, unspecified: Secondary | ICD-10-CM

## 2024-04-27 DIAGNOSIS — I25118 Atherosclerotic heart disease of native coronary artery with other forms of angina pectoris: Secondary | ICD-10-CM

## 2024-04-27 DIAGNOSIS — N184 Chronic kidney disease, stage 4 (severe): Secondary | ICD-10-CM

## 2024-04-27 DIAGNOSIS — Z952 Presence of prosthetic heart valve: Secondary | ICD-10-CM

## 2024-04-27 DIAGNOSIS — I471 Supraventricular tachycardia, unspecified: Secondary | ICD-10-CM

## 2024-04-27 NOTE — Patient Instructions (Signed)
 Medication Instructions:  Stop taking Hydralazine   *If you need a refill on your cardiac medications before your next appointment, please call your pharmacy*  Lab Work: No labs ordered today    Testing/Procedures: No test ordered today   Follow-Up: At [facility], you and your health needs are our priority.  As part of our continuing mission to provide you with exceptional heart care, our providers are all part of one team.  This team includes your primary Cardiologist (physician) and Advanced Practice Providers or APPs (Physician Assistants and Nurse Practitioners) who all work together to provide you with the care you need, when you need it.  Your next appointment:   1 month(s)  Provider:   You may see one of the following Advanced Practice Providers on your designated Care Team:   Laneta Pintos, NP Gildardo Labrador, PA-C Varney Gentleman, PA-C Cadence [place], PA-C Ronald Cockayne, NP Morey Ar, NP

## 2024-04-28 ENCOUNTER — Encounter: Payer: Self-pay | Admitting: Internal Medicine

## 2024-04-28 DIAGNOSIS — R339 Retention of urine, unspecified: Secondary | ICD-10-CM | POA: Insufficient documentation

## 2024-05-03 ENCOUNTER — Other Ambulatory Visit: Payer: Self-pay

## 2024-05-03 ENCOUNTER — Encounter: Payer: Self-pay | Admitting: Oncology

## 2024-05-03 DIAGNOSIS — N184 Chronic kidney disease, stage 4 (severe): Secondary | ICD-10-CM

## 2024-05-04 ENCOUNTER — Inpatient Hospital Stay: Payer: Self-pay

## 2024-05-10 ENCOUNTER — Ambulatory Visit: Admitting: Urology

## 2024-05-10 DIAGNOSIS — R339 Retention of urine, unspecified: Secondary | ICD-10-CM | POA: Diagnosis not present

## 2024-05-10 NOTE — Progress Notes (Signed)
 Cath Change/ Replacement  Patient is present today for a catheter change due to urinary retention.  8 ml of water was removed from the balloon, a 18 FR Coude foley cath was removed without difficulty.  Patient was cleaned and prepped in a sterile fashion with betadine and 2% lidocaine  jelly was instilled into the urethra. A 18 FR Coude foley cath was replaced into the bladder, no complications were noted. Urine return was noted 30 ml and urine was yellow in color. The balloon was filled with 10ml of sterile water.  A leg bag was attached for drainage.  A night bag was also given to the patient and patient was given instruction on how to change from one bag to another. Patient was given proper instruction on catheter care.    Performed by: Matilde Son, PA-C   Follow up: Return for SPT placement .

## 2024-05-11 ENCOUNTER — Telehealth: Payer: Self-pay | Admitting: *Deleted

## 2024-05-11 ENCOUNTER — Inpatient Hospital Stay

## 2024-05-11 NOTE — Telephone Encounter (Signed)
 The daughter of Mr. Hooton called saying that the blood pressure today is 193/93 and that is too high to come in and get Aranesp .  I spoke to Dr. Randy Buttery and she says then push it out till next week.  I called the daughter back and told her that we can do it next Friday which is the 13th at 1:00 her labs and then 1:15 for the shot.  The daughter says that she sees all the information on MyChart.  Did asked the daughter see if the PCP can see him because he needs something else to help to get that blood pressure down. She said she would tell him to get ti see PCP

## 2024-05-17 ENCOUNTER — Other Ambulatory Visit: Payer: Self-pay | Admitting: Internal Medicine

## 2024-05-18 ENCOUNTER — Inpatient Hospital Stay: Attending: Oncology

## 2024-05-18 ENCOUNTER — Inpatient Hospital Stay

## 2024-05-18 ENCOUNTER — Other Ambulatory Visit: Payer: Self-pay

## 2024-05-18 VITALS — BP 106/53

## 2024-05-18 DIAGNOSIS — I129 Hypertensive chronic kidney disease with stage 1 through stage 4 chronic kidney disease, or unspecified chronic kidney disease: Secondary | ICD-10-CM | POA: Diagnosis present

## 2024-05-18 DIAGNOSIS — D631 Anemia in chronic kidney disease: Secondary | ICD-10-CM | POA: Insufficient documentation

## 2024-05-18 DIAGNOSIS — N184 Chronic kidney disease, stage 4 (severe): Secondary | ICD-10-CM | POA: Insufficient documentation

## 2024-05-18 LAB — HEMOGLOBIN AND HEMATOCRIT (CANCER CENTER ONLY)
HCT: 28.7 % — ABNORMAL LOW (ref 39.0–52.0)
Hemoglobin: 9.5 g/dL — ABNORMAL LOW (ref 13.0–17.0)

## 2024-05-18 MED ORDER — ISOSORBIDE MONONITRATE ER 60 MG PO TB24: 60.0000 mg | ORAL_TABLET | Freq: Every day | ORAL | 3 refills | Status: DC

## 2024-05-18 MED ORDER — DARBEPOETIN ALFA 40 MCG/0.4ML IJ SOSY
70.0000 ug | PREFILLED_SYRINGE | INTRAMUSCULAR | Status: DC
  Administered 2024-05-18: 70 ug via SUBCUTANEOUS
  Filled 2024-05-18: qty 0.7

## 2024-05-18 MED ORDER — ATORVASTATIN CALCIUM 40 MG PO TABS: 40.0000 mg | ORAL_TABLET | Freq: Every day | ORAL | 3 refills | Status: AC

## 2024-05-21 ENCOUNTER — Other Ambulatory Visit: Payer: Self-pay

## 2024-05-21 DIAGNOSIS — Z01818 Encounter for other preprocedural examination: Secondary | ICD-10-CM

## 2024-05-21 NOTE — Progress Notes (Signed)
 Called and spoke to daughter-n-law, Gordan Latina, on 05/21/24 at 9:17 am. Pt has been off aspirin  since 6/11 and knows to come to the heart and vascular entrance at 730, NPO after midnight, and needs a driver. Daughter-n-law confirmed.

## 2024-05-21 NOTE — H&P (Signed)
 Chief Complaint: Patient was seen in consultation today for chronic urinary retention secondary to BPH, with consideration for suprapubic catheter placement.  Referring Provider(s): Ms. Clotilda Cornwall, PA-C.   Supervising Physician: Karalee Beat  Patient Status: [facility] - Out-pt  Patient is Full Code  History of Present Illness: Rodney Wiley is a 88 y.o. male  with PMHx notable for chronic urinary retention, BPH, HTN, HLD, mitral, pulmonic, and aortic valve insufficiency with ascending aortic aneurysm and aortic valve replacement, CAP s/p CABG x3, CKD, left renal artery stenosis, GERD, anemia, and arthritis. He recently underwent bioprosthetic aortic valve replacement, CABG x3 and resuspension of the left atrium on 02/22/2018.   Patient is know to IR service, having most recently undergone left iliac bone aspiration and core biopsy on 2/24 by Dr. Karalee.  Patient is diagnosed with chronic urinary retention requiring indwelling foley catheter, which is exchanged by Ms. McGowan on a monthly basis. Per Ms. McGowan's progress note on 5/1: Follow up: Return in about 1 month (around 05/06/2024) for Foley exchange .    We discussed suprapubic placement today and he is wanting to proceed.  I explained that this procedure was performed by interventional radiologist and what typically happens is they put in a pigtail catheter initially for 30 days and then bring the patient back to put a council tip Foley catheter in place and then again in 30 days to put a regular Foley in place and then after that the suprapubic tube exchanges will happen with us .  He is in agreement and wishes to proceed.   Interventional Radiology was requested for suprapubic catheter placement. Patient is scheduled for same in IR today.   Patient is alert and laying in bed, calm. Wife is at bedside. Patient is currently without any significant complaints. He does endorse moderate pain at left knee,  chronic. Patient denies any fevers, headache, chest pain, SOB, cough, abdominal pain, nausea, vomiting or bleeding.     Past Medical History:  Diagnosis Date   Anemia    Aortic insufficiency    a. 02/2018 s/p bioprosthetic AVR; 04/2018 Echo: EF 50-55%, Gr2 DD, Ao bioprosthesis, mean grad , Ao root/Asc Ao nl in size, mild MR, mildly dil LA, nl RV fxn. Nl PASP.   Arthropathy    Ascending aortic aneurysm (HCC)    a. 12/2016 MRA: 4.3cm @ sinus of valsalva, 4.9cm above sinotubular jxn; b. 02/2018 s/p biological Bentall and AVR.   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 02/2018 Cath: LAD 20ost, 40p, 62m, LCX 60p, OM2 60, OM3 85, RCA 10p/m w/ L->R and R->R collats;  b. 02/2018 CABG x 3: LIMA to LAD, SVG to OM2, SVG to PDA, EVH via right thigh.   CKD (chronic kidney disease), stage III (HCC)    Diverticulitis    Essential hypertension    GERD (gastroesophageal reflux disease)    Hemorrhoids    History of chicken pox    History of Helicobacter pylori infection 06/2007   Hyperlipidemia    Hypertension    Left renal artery stenosis (HCC)    a. 01/2017 RA u/s: moderate L RAS.   Lower extremity edema    Nonrheumatic mitral (valve) insufficiency    a. 12/2016 Echo: mild to mod MR; b. 09/2017 TEE: mild to mod MR; c. 04/2018 Echo: Mild MR.   Nonrheumatic pulmonary valve insufficiency    S/P biological Bentall aortic root replacement with bioprosthetic valve and synthetic  root conduit 02/22/2018   a. s/p 21 mm Edwards Inspiris Resilia stented bovine pericardial tissue valve and 24 mm Gelweave Valsalva synthetic root conduit with reimplantation of left main coronary artery.    Past Surgical History:  Procedure Laterality Date   AORTIC VALVE REPAIR N/A 02/22/2018   Procedure: AORTIC VALVE REPLACEMENT -Biological Bentall aortic root replacement,;  Surgeon: Dusty Sudie DEL, MD;  Location: [facility] OR;  Service: Open Heart Surgery;  Laterality: N/A;   bypass surgery      CARDIAC CATHETERIZATION     01/16/2018   COLONOSCOPY  2011   by Dr. Ora with findings of diverticulosis   CORONARY ARTERY BYPASS GRAFT N/A 02/22/2018   Procedure: CORONARY ARTERY BYPASS GRAFTING (CABG) x three, using left internal mammary artery and right leg greater saphenous vein harvested endoscopically;  Surgeon: Dusty Sudie DEL, MD;  Location[facility] OR;  Service: Open Heart Surgery;  Laterality: N/A;   CORONARY/GRAFT ANGIOGRAPHY N/A 01/16/2018   Procedure: CORONARY/GRAFT ANGIOGRAPHY;  Surgeon: Mady Bruckner, MD;  Location: [facility] INVASIVE CV LAB;  Service: Cardiovascular;  Laterality: N/A;   ELBOW SURGERY Left    hemorhoidectomy     IR BONE MARROW BIOPSY & ASPIRATION  01/30/2024   RIGHT HEART CATH N/A 01/16/2018   Procedure: RIGHT HEART CATH;  Surgeon: Mady Bruckner, MD;  Location: [facility] INVASIVE CV LAB;  Service: Cardiovascular;  Laterality: N/A;   SHOULDER ARTHROSCOPY WITH ROTATOR CUFF REPAIR Right    SHOULDER SURGERY Right    TEE WITHOUT CARDIOVERSION N/A 09/27/2017   Procedure: TRANSESOPHAGEAL ECHOCARDIOGRAM (TEE);  Surgeon: Maranda Leim DEL, MD;  Location: [facility] ENDOSCOPY;  Service: Cardiovascular;  Laterality: N/A;   TEE WITHOUT CARDIOVERSION N/A 02/22/2018   Procedure: TRANSESOPHAGEAL ECHOCARDIOGRAM (TEE);  Surgeon: Dusty Sudie DEL, MD;  Location: [facility] OR;  Service: Open Heart Surgery;  Laterality: N/A;   THORACIC AORTIC ANEURYSM REPAIR N/A 02/22/2018   Procedure: THORACIC ASCENDING ANEURYSM REPAIR (AAA) Resection of ascending aorta aneurysm;  Surgeon: Dusty Sudie DEL, MD;  Location: [facility] OR;  Service: Open Heart Surgery;  Laterality: N/A;    Allergies: Patient has no known allergies.  Medications: Prior to Admission medications   Medication Sig Start Date End Date Taking? Authorizing Provider  amiodarone  (PACERONE ) 100 MG tablet TAKE 1 TABLET BY MOUTH EVERY DAY 12/14/23   End, Bruckner, MD  amLODipine  (NORVASC ) 10 MG tablet Take 5 mg by mouth daily.    [provider]  aspirin   EC 81 MG tablet Take 81 mg by mouth daily.    [provider]  atorvastatin  (LIPITOR) 40 MG tablet Take 1 tablet (40 mg total) by mouth daily. 05/18/24   End, Bruckner, MD  cholecalciferol (VITAMIN D3) 25 MCG (1000 UT) tablet Take 1,000 Units by mouth daily.    [provider]  isosorbide  mononitrate (IMDUR ) 60 MG 24 hr tablet Take 1 tablet (60 mg total) by mouth daily. 05/18/24   End, Bruckner, MD  lisinopril  (ZESTRIL ) 5 MG tablet Take 1 tablet (5 mg total) by mouth daily. 01/27/24 04/27/24  End, Bruckner, MD  oxyCODONE  (OXY IR/ROXICODONE ) 5 MG immediate release tablet Take 5 mg by mouth every 6 (six) hours as needed. 08/16/23   [provider]  pantoprazole  (PROTONIX ) 40 MG tablet Take 1 tablet (40 mg total) by mouth daily. 08/21/23 08/20/24  Dorinda Drue DASEN, MD  tamsulosin  (FLOMAX ) 0.4 MG CAPS capsule Take 1 capsule (0.4 mg total) by mouth daily. 01/05/24   McGowan, Clotilda A, PA-C  torsemide  (DEMADEX ) 10 MG tablet TAKE 1 TABLET  BY MOUTH EVERY DAY AS NEEDED 07/11/23   End, Lonni, MD  traMADol  (ULTRAM ) 50 MG tablet Take 50 mg by mouth every 6 (six) hours as needed. 07/15/23   [provider]     Family History  Problem Relation Age of Onset   Hypertension Mother    Stroke Mother    Leukemia Father    Heart attack Paternal Grandmother    Stroke Paternal Grandfather    Heart Problems Son 51   Heart Problems Son 63   Uterine cancer Sister    Prostate cancer Brother    Colon cancer Brother    Kidney failure Brother    Stroke Sister     Social History   Socioeconomic History   Marital status: Widowed    Spouse name: Not on file   Number of children: Not on file   Years of education: Not on file   Highest education level: Not on file  Occupational History   Not on file  Tobacco Use   Smoking status: Never   Smokeless tobacco: Never  Vaping Use   Vaping status: Never Used  Substance and Sexual Activity   Alcohol  use: No   Drug use: No    Sexual activity: Not Currently  Other Topics Concern   Not on file  Social History Narrative   Not on file   Social Drivers of Health   Financial Resource Strain: Patient Declined (09/01/2023)   Received from [facility]   Overall Financial Resource Strain (CARDIA)    Difficulty of Paying Living Expenses: Patient declined  Food Insecurity: No Food Insecurity (09/01/2023)   Received [facility]   Hunger Vital Sign    Within the past 12 months, you worried that your food would run out before you got the money to buy more.: Never true    Within the past 12 months, the food you bought just didn't last and you didn't have money to get more.: Never true  Transportation Needs: No Transportation Needs (09/01/2023)   Received from [facility]   PRAPARE - Transportation    Lack of Transportation (Medical): No    Lack of Transportation (Non-Medical): No  Physical Activity: Not on file  Stress: Not on file  Social Connections: Not on file     Review of Systems: A 12 point ROS discussed and pertinent positives are indicated in the HPI above.  All other systems are negative.  Vital Signs: There were no vitals taken for this visit.  Advance Care Plan: The advanced care place/surrogate decision maker was discussed at the time of visit and the patient did not wish to discuss or was not able to name a surrogate decision maker or provide an advance care plan.  Physical Exam Vitals reviewed.  Constitutional:      General: He is not in acute distress.    Appearance: Normal appearance.  HENT:     Mouth/Throat:     Mouth: Mucous membranes are moist.   Cardiovascular:     Rate and Rhythm: Normal rate and regular rhythm.     Pulses: Normal pulses.     Heart sounds: Murmur heard.  Pulmonary:     Effort: Pulmonary effort is normal.     Breath sounds: Normal breath sounds.  Abdominal:     General: Abdomen is flat. There is no distension.     Palpations: Abdomen is soft.      Tenderness: There is no abdominal tenderness.  Genitourinary:  Comments: Urethral foley catheter in place.  Musculoskeletal:        General: Normal range of motion.     Cervical back: Normal range of motion.   Skin:    General: Skin is warm and dry.   Neurological:     Mental Status: He is alert and oriented to person, place, and time.   Psychiatric:        Mood and Affect: Mood normal.        Behavior: Behavior normal.        Thought Content: Thought content normal.        Judgment: Judgment normal.     Imaging: No results found.  Labs:  CBC: Recent Labs    08/23/23 1158 09/23/23 0950 09/29/23 0945 01/20/24 1354 01/30/24 0902 02/03/24 1307 02/17/24 1336 03/02/24 1305 04/13/24 1310 05/18/24 1306  WBC 6.0 4.3  --  4.1 6.5  --   --   --   --   --   HGB 8.7* 7.6*   < > 8.4* 8.8*   < > 8.3* 9.1* 9.2* 9.5*  HCT 25.5* 23.5*   < > 26.7* 27.8*   < > 26.4* 28.8* 27.8* 28.7*  PLT 145* 199  --  162 163  --   --   --   --   --    < > = values in this interval not displayed.    COAGS: Recent Labs    08/20/23 2330  INR 1.2    BMP: Recent Labs    08/23/23 1158 09/29/23 0944 01/20/24 1353 04/05/24 1513  NA 132* 137 138 132*  K 4.3 4.6 3.9 4.2  CL 103 112* 107 107  CO2 20* 18* 22 18*  GLUCOSE 110* 106* 101* 102*  BUN 26* 40* 33* 46*  CALCIUM  8.5* 8.3* 8.3* 8.3*  CREATININE 2.20* 2.24* 1.89* 2.48*  GFRNONAA 29* 28* 34* 25*    LIVER FUNCTION TESTS: Recent Labs    07/27/23 0417 08/20/23 1945 08/23/23 1158 04/05/24 1513  BILITOT 0.9 1.0 1.2 0.8  AST 22 18 20 18   ALT 16 15 14 13   ALKPHOS 56 54 52 45  PROT 7.1 6.4* 6.2* 6.4*  ALBUMIN  3.8 3.6 3.8 3.6    TUMOR MARKERS: No results for input(s): AFPTM, CEA, CA199, CHROMGRNA in the last 8760 hours.  Assessment and Plan: Patient is diagnosed with chronic urinary retention requiring indwelling foley catheter, which is exchanged by Ms. McGowan on a monthly basis. Per Ms. McGowan's progress note on  5/1: Follow up: Return in about 1 month (around 05/06/2024) for Foley exchange .    We discussed suprapubic placement today and he is wanting to proceed.  I explained that this procedure was performed by interventional radiologist and what typically happens is they put in a pigtail catheter initially for 30 days and then bring the patient back to put a council tip Foley catheter in place and then again in 30 days to put a regular Foley in place and then after that the suprapubic tube exchanges will happen with us .  He is in agreement and wishes to proceed.  Patient presents for scheduled suprapubic catheter placement in IR today.  Patient has been NPO since midnight.  All labs and medications are within acceptable parameters. Last dose Aspirin  on 6/11. No pertinent allergies.   Risks and benefits of suprapubic catheter placement were discussed with the patient including bleeding, infection, damage to adjacent structures, bladder perforation/fistula connection, and sepsis.  All of the patient's questions  were answered, patient is agreeable to proceed. Consent signed and in chart.      Thank you for allowing our service to participate in Rodney Wiley 's care.  Electronically Signed: Carlin DELENA Griffon, PA-C   05/21/2024, 3:29 PM      I spent a total of 30 Minutes  in face to face in clinical consultation, greater than 50% of which was counseling/coordinating care for urinary retention, with consideration for suprapubic catheter placement.

## 2024-05-22 ENCOUNTER — Other Ambulatory Visit: Payer: Self-pay

## 2024-05-22 ENCOUNTER — Encounter: Payer: Self-pay | Admitting: Radiology

## 2024-05-22 ENCOUNTER — Other Ambulatory Visit: Payer: Self-pay | Admitting: Interventional Radiology

## 2024-05-22 ENCOUNTER — Ambulatory Visit
Admission: RE | Admit: 2024-05-22 | Discharge: 2024-05-22 | Disposition: A | Discharge status: Home / Self Care | Source: Ambulatory Visit | Attending: Urology | Admitting: Urology

## 2024-05-22 DIAGNOSIS — I701 Atherosclerosis of renal artery: Secondary | ICD-10-CM | POA: Diagnosis not present

## 2024-05-22 DIAGNOSIS — E785 Hyperlipidemia, unspecified: Secondary | ICD-10-CM | POA: Diagnosis not present

## 2024-05-22 DIAGNOSIS — Z953 Presence of xenogenic heart valve: Secondary | ICD-10-CM | POA: Diagnosis not present

## 2024-05-22 DIAGNOSIS — D631 Anemia in chronic kidney disease: Secondary | ICD-10-CM | POA: Insufficient documentation

## 2024-05-22 DIAGNOSIS — I129 Hypertensive chronic kidney disease with stage 1 through stage 4 chronic kidney disease, or unspecified chronic kidney disease: Secondary | ICD-10-CM | POA: Diagnosis not present

## 2024-05-22 DIAGNOSIS — I7121 Aneurysm of the ascending aorta, without rupture: Secondary | ICD-10-CM | POA: Insufficient documentation

## 2024-05-22 DIAGNOSIS — K219 Gastro-esophageal reflux disease without esophagitis: Secondary | ICD-10-CM | POA: Diagnosis not present

## 2024-05-22 DIAGNOSIS — R338 Other retention of urine: Secondary | ICD-10-CM | POA: Diagnosis not present

## 2024-05-22 DIAGNOSIS — N401 Enlarged prostate with lower urinary tract symptoms: Secondary | ICD-10-CM | POA: Diagnosis present

## 2024-05-22 DIAGNOSIS — Z79899 Other long term (current) drug therapy: Secondary | ICD-10-CM | POA: Diagnosis not present

## 2024-05-22 DIAGNOSIS — R339 Retention of urine, unspecified: Secondary | ICD-10-CM

## 2024-05-22 DIAGNOSIS — N183 Chronic kidney disease, stage 3 unspecified: Secondary | ICD-10-CM | POA: Insufficient documentation

## 2024-05-22 DIAGNOSIS — Z01818 Encounter for other preprocedural examination: Secondary | ICD-10-CM

## 2024-05-22 DIAGNOSIS — Z951 Presence of aortocoronary bypass graft: Secondary | ICD-10-CM | POA: Insufficient documentation

## 2024-05-22 DIAGNOSIS — Z7982 Long term (current) use of aspirin: Secondary | ICD-10-CM | POA: Insufficient documentation

## 2024-05-22 HISTORY — PX: IR CYSTOSTOMY TUBE PLACEMENT/BLADDER ASPIRATION: IMG1097

## 2024-05-22 LAB — CBC
HCT: 26.4 % — ABNORMAL LOW (ref 39.0–52.0)
Hemoglobin: 8.7 g/dL — ABNORMAL LOW (ref 13.0–17.0)
MCH: 31.1 pg (ref 26.0–34.0)
MCHC: 33 g/dL (ref 30.0–36.0)
MCV: 94.3 fL (ref 80.0–100.0)
Platelets: 162 10*3/uL (ref 150–400)
RBC: 2.8 MIL/uL — ABNORMAL LOW (ref 4.22–5.81)
RDW: 15 % (ref 11.5–15.5)
WBC: 5.9 10*3/uL (ref 4.0–10.5)
nRBC: 0 % (ref 0.0–0.2)

## 2024-05-22 LAB — PROTIME-INR
INR: 1.1 (ref 0.8–1.2)
Prothrombin Time: 14.2 s (ref 11.4–15.2)

## 2024-05-22 MED ORDER — FENTANYL CITRATE (PF) 100 MCG/2ML IJ SOLN
INTRAMUSCULAR | Status: AC
  Filled 2024-05-22: qty 2

## 2024-05-22 MED ORDER — CHLORHEXIDINE GLUCONATE 4 % EX SOLN: Freq: Once | CUTANEOUS | Status: AC

## 2024-05-22 MED ORDER — TRIPLE ANTIBIOTIC 3.5-400-5000 EX OINT
1.0000 | TOPICAL_OINTMENT | Freq: Once | CUTANEOUS | Status: AC
  Administered 2024-05-22: 1 via CUTANEOUS
  Filled 2024-05-22: qty 1

## 2024-05-22 MED ORDER — IOHEXOL 300 MG/ML  SOLN
10.0000 mL | Freq: Once | INTRAMUSCULAR | Status: AC | PRN
  Administered 2024-05-22: 10 mL

## 2024-05-22 MED ORDER — FENTANYL CITRATE (PF) 100 MCG/2ML IJ SOLN
INTRAMUSCULAR | Status: DC | PRN
  Administered 2024-05-22: 50 ug via INTRAVENOUS

## 2024-05-22 MED ORDER — LIDOCAINE HCL 1 % IJ SOLN
INTRAMUSCULAR | Status: AC
  Filled 2024-05-22: qty 20

## 2024-05-22 MED ORDER — SODIUM CHLORIDE 0.9 % IV SOLN: INTRAVENOUS | Status: DC

## 2024-05-22 MED ORDER — MIDAZOLAM HCL 2 MG/2ML IJ SOLN
INTRAMUSCULAR | Status: AC
Start: 2024-05-22 — End: 2024-05-22
  Filled 2024-05-22: qty 2

## 2024-05-22 MED ORDER — MIDAZOLAM HCL 2 MG/2ML IJ SOLN
INTRAMUSCULAR | Status: DC | PRN
  Administered 2024-05-22: 1 mg via INTRAVENOUS

## 2024-05-22 MED ORDER — LIDOCAINE HCL 1 % IJ SOLN
10.0000 mL | Freq: Once | INTRAMUSCULAR | Status: AC
  Administered 2024-05-22: 10 mL via INTRADERMAL

## 2024-05-22 MED ORDER — SODIUM CHLORIDE 0.9% FLUSH: 5.0000 mL | Freq: Three times a day (TID) | INTRAVENOUS | Status: DC

## 2024-05-22 NOTE — Progress Notes (Signed)
 Patient clinically stable post IR SPT placement per DR Marne Sings, tolerated well. Vitals stable pre and post procedure. Received Versed  1 mg along with Fentanyl  50 mcg IV for procedure. Report given to [facility] RN post procedure.

## 2024-05-22 NOTE — Procedures (Signed)
 Interventional Radiology Procedure Note  Procedure: Placement of a 14F Council-tip Foley catheter via suprapubic approach.   Complications: None  Estimated Blood Loss: None  Recommendations:  - DC home - Return to IR in 6-8 wks for first tube exchange   Signed,  Roxie Cord, MD

## 2024-05-25 ENCOUNTER — Inpatient Hospital Stay: Payer: Self-pay | Admitting: Oncology

## 2024-05-25 ENCOUNTER — Ambulatory Visit: Payer: Self-pay

## 2024-05-25 ENCOUNTER — Inpatient Hospital Stay: Payer: Self-pay

## 2024-05-25 ENCOUNTER — Other Ambulatory Visit: Payer: Self-pay

## 2024-05-28 ENCOUNTER — Ambulatory Visit: Attending: Medical | Admitting: Medical

## 2024-05-28 ENCOUNTER — Encounter: Payer: Self-pay | Admitting: Medical

## 2024-05-28 VITALS — BP 104/56 | HR 49 | Ht 67.0 in | Wt 142.2 lb

## 2024-05-28 DIAGNOSIS — R600 Localized edema: Secondary | ICD-10-CM | POA: Diagnosis not present

## 2024-05-28 DIAGNOSIS — I471 Supraventricular tachycardia, unspecified: Secondary | ICD-10-CM

## 2024-05-28 DIAGNOSIS — I5031 Acute diastolic (congestive) heart failure: Secondary | ICD-10-CM

## 2024-05-28 DIAGNOSIS — I10 Essential (primary) hypertension: Secondary | ICD-10-CM

## 2024-05-28 DIAGNOSIS — Z79899 Other long term (current) drug therapy: Secondary | ICD-10-CM

## 2024-05-28 DIAGNOSIS — I25118 Atherosclerotic heart disease of native coronary artery with other forms of angina pectoris: Secondary | ICD-10-CM | POA: Diagnosis not present

## 2024-05-28 DIAGNOSIS — R001 Bradycardia, unspecified: Secondary | ICD-10-CM

## 2024-05-28 MED ORDER — ISOSORBIDE MONONITRATE ER 30 MG PO TB24
30.0000 mg | ORAL_TABLET | Freq: Every day | ORAL | 3 refills | Status: AC
Start: 2024-05-28 — End: ?

## 2024-05-28 NOTE — Progress Notes (Signed)
 Cardiology Office Note   Date:  05/28/2024  ID:  Rodney Wiley, DOB 1936-10-23, MRN [id number] PCP: Donal Channing SQUIBB, FNP  [facility] Providers Cardiologist:  Lonni Hanson, MD   History of Present Illness Rodney Wiley is a 88 y.o. male with history of CAD s/p CABG in 2009, thoracic aortic aneurysm and aortic regurgitation status post Bentall procedure with bio prosthetic aortic valve replacement at the time of CABG, chronic HFpEF, SVT, frequent PVCs, resistant hypertension, CKD stage IV with possible left renal artery stenosis presents for follow-up.  The patient underwent CABG and bioprosthetic AVR in 02/2018 with LIMA to LAD, SVG to OM2 and SVG to PDA. 24mm synthetic aortic root graft and 21mm Edwards Inspiris Roselia stented bovine pericardial tissue valve was used. EF shortly after was 50-55%, G2DD. Heart monitor 2020 showed NSR, PACs, frequent PVCs and runs of atrial tachycardia, PVC burden 8.4%. The patient was started on amiodarone . BB was stopped due to 1st degree AV block. Echo 02/2023 showed normal LVEF 55-60%, mild LVH, G1DD, normal mitral valve, aortic valve mean gradient 18.4mmHg.   The patient presented to the ER on 07/26/2023 with weakness, lightheadedness, elevated blood pressures, nausea and diaphoresis for the last few weeks.  High-sensitivity troponin elevated to 26.  EKG was nonacute.  Echo showed normal LV function, grade 2 diastolic dysfunction, moderate MR. Telemetry was unremarkable. Blood  Pressure medications were adjusted.   He was admitted at [facility] in September 2024 with hyponatremia secondary to SIADH, dyspnea, dysphagia with severe GERD (nausea and vomiting), normocytic anemia. He underwent extensive work-up to r/o underlying malignancy resulting in SIADGE. Barium swallow showed severe GERD and esophageal dysmotility, PPI was increased.   Patient was last seen 04/27/2024 by Dr. Hanson for worsening hydralazine  was stopped for borderline blood  pressures.  Today, the patient reports worsening lower leg edema. It has been severe and he has been taking torsemide  10mg  daily. He had suprapubic tube put in last week. He has been getting dizzy and lightheaded and broke out in a sweat one day. He has not been on amiodarone  for several weeks, about 3 weeks.    Studies Reviewed EKG Interpretation Date/Time:  Monday May 28 2024 10:49:16 EDT Ventricular Rate:  49 PR Interval:  376 QRS Duration:  108 QT Interval:  494 QTC Calculation: 446 R Axis:   -54  Text Interpretation: Sinus bradycardia with 1st degree A-V block Left anterior fascicular block Minimal voltage criteria for LVH, may be normal variant ( Cornell product ) Cannot rule out Inferior infarct (cited on or before 27-Oct-2023) Anteroseptal infarct (cited on or before 27-Oct-2023) When compared with ECG of 27-Apr-2024 08:41, No significant change was found Confirmed by Franchester, Beverlyann Broxterman ([id number]) on 05/28/2024 11:55:28 AM    Echo 07/2023  1. Left ventricular ejection fraction, by estimation, is 55 to 60%. The  left ventricle has normal function. The left ventricle has no regional  wall motion abnormalities. There is moderate left ventricular hypertrophy.  Left ventricular diastolic  parameters are consistent with Grade II diastolic dysfunction  (pseudonormalization).   2. Right ventricular systolic function is normal. The right ventricular  size is normal.   3. Left atrial size was mildly dilated.   4. The mitral valve is normal in structure. Moderate mitral valve  regurgitation. No evidence of mitral stenosis.   5. The aortic valve is normal in structure. Aortic valve regurgitation is  not visualized. Mild aortic valve stenosis. Aortic valve mean gradient  measures 16.4 mmHg. Aortic valve  Vmax measures 2.71 m/s.   6. The inferior vena cava is normal in size with greater than 50%  respiratory variability, suggesting right atrial pressure of 3 mmHg.   Echo 02/2023 1. Left  ventricular ejection fraction, by estimation, is 55 to 60%. The  left ventricle has normal function. The left ventricle demonstrates  regional wall motion abnormalities (septal wall hypokinesis possibly  secondary to post-operative state). There is   mild left ventricular hypertrophy. Left ventricular diastolic parameters  are consistent with Grade I diastolic dysfunction (impaired relaxation).  The average left ventricular global longitudinal strain is -12.6 %.   2. Right ventricular systolic function is normal. The right ventricular  size is normal. Tricuspid regurgitation signal is inadequate for assessing  PA pressure.   3. Left atrial size was moderately dilated.   4. The mitral valve is normal in structure. No evidence of mitral valve  regurgitation. No evidence of mitral stenosis.   5. The aortic valve has been repaired/replaced. Aortic valve  regurgitation is not visualized. No aortic stenosis is present. There is a  Edwards bioprosthetic valve present in the aortic position. Procedure  Date: 02/2018. Aortic valve mean gradient  measures 18.7 mmHg. Aortic valve Vmax measures 2.95 m/s.   6. The inferior vena cava is normal in size with greater than 50%  respiratory variability, suggesting right atrial pressure of 3 mmHg.   Echo 2022  1. Left ventricular ejection fraction, by estimation, is 60 to 65%. Left  ventricular ejection fraction by 3D volume is 69 %. The left ventricle has  normal function. The left ventricle has no regional wall motion  abnormalities. There is mild left  ventricular hypertrophy. Left ventricular diastolic parameters are  indeterminate.   2. Right ventricular systolic function is low normal. The right  ventricular size is normal. There is normal pulmonary artery systolic  pressure.   3. The mitral valve is normal in structure. Mild to moderate mitral valve  regurgitation.   4. The aortic valve has been repaired/replaced. Aortic valve  regurgitation is  not visualized. There is a Edwards bioprosthetic valve  present in the aortic position. Echo findings are consistent with normal  structure and function of the aortic valve  prosthesis. Aortic valve mean gradient measures 20.5 mmHg.   5. The inferior vena cava is normal in size with greater than 50%  respiratory variability, suggesting right atrial pressure of 3 mmHg.       Physical Exam VS:  BP (!) 104/56   Pulse (!) 49   Ht 5' 7 (1.702 m)   Wt 142 lb 3.2 oz (64.5 kg)   SpO2 97%   BMI 22.27 kg/m        Wt Readings from Last 3 Encounters:  05/28/24 142 lb 3.2 oz (64.5 kg)  04/27/24 142 lb (64.4 kg)  03/06/24 142 lb (64.4 kg)    GEN: Well nourished, well developed in no acute distress NECK: No JVD; No carotid bruits CARDIAC: bradycardia, RR, + murmurs, rubs, gallops RESPIRATORY:  Clear to auscultation without rales, wheezing or rhonchi  ABDOMEN: Soft, non-tender, non-distended EXTREMITIES:  2-3+ lower leg edema; No deformity   ASSESSMENT AND PLAN  LLE HFpEF Patient reports worsening lower leg edema since the last visit. He has 2-3+ lower leg edema on Torsemide  10mg  daily. I will increase Torsemide  to 40mg  daily x 3 days, then down to 20mg  daily. I will stop amlodipine  as well. I will repeat echo echocardiogram. BMET in 2 weeks.   HTN BP is  borderline low. I am stopping amlodipine  and decreasing Imdur  to 30mg  daily. Continue Lisinopril  5mg  daily.   CAD Patient denies anginal symptoms. Continue ASA and statin therapy.   Sinus Bradycardia pSVT Patient has been on amiodarone  100mg  daily for pSVT. Insurance stopped covering it and he has not had amiodarone  for 3 weeks. EKG today shows persistent bradycardia with HR 49bpm and PRI 344>378ms (has been long in the past). He has had dizziness and sweating since the last visit, but unsure if this is related to the heart rate. It will take for amiodarone  to completely wash out. Will refer to EP for further recommendations for  bradycardia and pSVT. I will update a Mag today.         Dispo: follow-up in 2-3 months  Signed, Elbia Paro VEAR Fishman, PA-C

## 2024-05-28 NOTE — Patient Instructions (Signed)
 Medication Instructions:  Your physician recommends the following medication changes.  STOP TAKING: Amlodipine   Amiodarone    DECREASE: Imdur  to 30 mg by mouth daily  INCREASE: Torsemide  to 40 mg by mouth for 3 days, then resume 20 mg daily thereafter    *If you need a refill on your cardiac medications before your next appointment, please call your pharmacy*  Lab Work: Your provider would like for you to have following labs drawn today Mg.    Your provider would like for you to return in 2 week to have the following labs drawn: BMP.   Please go to [facility] [address] ([facility]) [address], [place] [address] You do not need an appointment.  They are open from 8 am- 4:30 pm.  Lunch from 1:00 pm- 2:00 pm You will not need to be fasting.    Testing/Procedures: Your physician has requested that you have an echocardiogram. Echocardiography is a painless test that uses sound waves to create images of your heart. It provides your doctor with information about the size and shape of your heart and how well your heart's chambers and valves are working.   You may receive an ultrasound enhancing agent through an IV if needed to better visualize your heart during the echo. This procedure takes approximately one hour.  There are no restrictions for this procedure.  This will take place at [address] [facility]) [address], [place] [address]  Please note: We ask at that you not bring children with you during ultrasound (echo/ vascular) testing. Due to room size and safety concerns, children are not allowed in the ultrasound rooms during exams. Our front office staff cannot provide observation of children in our lobby area while testing is being conducted. An adult accompanying a patient to their appointment will only be allowed in the ultrasound room at the discretion of the ultrasound technician under special circumstances. We apologize for any  inconvenience.   Follow-Up: At [facility], you and your health needs are our priority.  As part of our continuing mission to provide you with exceptional heart care, our providers are all part of one team.  This team includes your primary Cardiologist (physician) and Advanced Practice Providers or APPs (Physician Assistants and Nurse Practitioners) who all work together to provide you with the care you need, when you need it.  Your next appointment:   2 month(s)  Provider:   Lonni Hanson, MD or Cadence Franchester RIGGERS    Your physician recommends that you schedule a new EP consult

## 2024-05-29 ENCOUNTER — Ambulatory Visit: Payer: Self-pay | Admitting: Medical

## 2024-05-29 LAB — MAGNESIUM: Magnesium: 2.3 mg/dL (ref 1.6–2.3)

## 2024-06-13 ENCOUNTER — Inpatient Hospital Stay: Admitting: Oncology

## 2024-06-13 ENCOUNTER — Other Ambulatory Visit: Payer: Self-pay

## 2024-06-13 ENCOUNTER — Emergency Department
Admission: EM | Admit: 2024-06-13 | Discharge: 2024-06-13 | Discharge status: Against Medical Advice | Source: Skilled Nursing Facility | Attending: Emergency Medicine | Admitting: Emergency Medicine

## 2024-06-13 ENCOUNTER — Inpatient Hospital Stay: Attending: Oncology

## 2024-06-13 ENCOUNTER — Encounter: Payer: Self-pay | Admitting: Intensive Care

## 2024-06-13 DIAGNOSIS — I959 Hypotension, unspecified: Secondary | ICD-10-CM | POA: Diagnosis not present

## 2024-06-13 DIAGNOSIS — N289 Disorder of kidney and ureter, unspecified: Secondary | ICD-10-CM | POA: Diagnosis not present

## 2024-06-13 DIAGNOSIS — D649 Anemia, unspecified: Secondary | ICD-10-CM | POA: Insufficient documentation

## 2024-06-13 DIAGNOSIS — R55 Syncope and collapse: Secondary | ICD-10-CM | POA: Insufficient documentation

## 2024-06-13 LAB — COMPREHENSIVE METABOLIC PANEL WITH GFR
ALT: 10 U/L (ref 0–44)
AST: 18 U/L (ref 15–41)
Albumin: 3.8 g/dL (ref 3.5–5.0)
Alkaline Phosphatase: 47 U/L (ref 38–126)
Anion gap: 10 (ref 5–15)
BUN: 53 mg/dL — ABNORMAL HIGH (ref 8–23)
CO2: 21 mmol/L — ABNORMAL LOW (ref 22–32)
Calcium: 9 mg/dL (ref 8.9–10.3)
Chloride: 106 mmol/L (ref 98–111)
Creatinine, Ser: 2.37 mg/dL — ABNORMAL HIGH (ref 0.61–1.24)
GFR, Estimated: 26 mL/min — ABNORMAL LOW (ref >60)
Glucose, Bld: 113 mg/dL — ABNORMAL HIGH (ref 70–99)
Potassium: 4.5 mmol/L (ref 3.5–5.1)
Sodium: 137 mmol/L (ref 135–145)
Total Bilirubin: 0.9 mg/dL (ref 0.0–1.2)
Total Protein: 7 g/dL (ref 6.5–8.1)

## 2024-06-13 LAB — CBC
HCT: 30.2 % — ABNORMAL LOW (ref 39.0–52.0)
Hemoglobin: 9.8 g/dL — ABNORMAL LOW (ref 13.0–17.0)
MCH: 31.2 pg (ref 26.0–34.0)
MCHC: 32.5 g/dL (ref 30.0–36.0)
MCV: 96.2 fL (ref 80.0–100.0)
Platelets: 157 K/uL (ref 150–400)
RBC: 3.14 MIL/uL — ABNORMAL LOW (ref 4.22–5.81)
RDW: 15.8 % — ABNORMAL HIGH (ref 11.5–15.5)
WBC: 6.9 K/uL (ref 4.0–10.5)
nRBC: 0 % (ref 0.0–0.2)

## 2024-06-13 LAB — TROPONIN I (HIGH SENSITIVITY)
Troponin I (High Sensitivity): 14 ng/L (ref <18)
Troponin I (High Sensitivity): 15 ng/L (ref <18)

## 2024-06-13 NOTE — ED Notes (Signed)
 EDP notified regarding chronic catheter and if she still wanted sample. See d/c order.

## 2024-06-13 NOTE — ED Provider Notes (Signed)
 [facility] Provider Note    Event Date/Time   First MD Initiated Contact with Patient 06/13/24 1029     (approximate)   History   Hypotension   HPI  Rodney Wiley is a 88 year old male presenting to the ER for evaluation of low blood pressure and unresponsiveness.  Patient had scheduled follow-up with his nephrologist earlier today.  There he was noted to be hypotensive and had an episode of poor responsiveness with initial improvement, and then some worsening in mental status.  He was into the ER for further evaluation of the setting of this.  Patient thinks he may have had low blood pressure in the past, unsure etiology.  He reports feeling at his baseline over the past few days.  Denies chest pain, shortness of breath.  From nephrology note:  Patient seen in clinic today for scheduled follow-up. When being around noted to have significant hypotension with BP 71/45 [98/78 (56) 60/32 (58) 55/26 (57)] - average over 3 readings. Of note patient drove self to clinic and I observed him walking into clinic unassisted and in no distress. When I went into the room to see him he was initially poorly responsive - would not respond to my questions and did not initially respond to sternal rub (though sitting up unsupported) --> called EMS at this time. He had a good radial pulse and shortly after I began evaluating him he started responding to questions. He was able to answer questions appropriately. Diaphoretic and endorsed feeling hot. Denied other symptoms or complaints including shortness of breath, chest pain, other pain. While speaking with him he again stopped answering questions and was staring with decreased responsiveness but soon after answering questions appropriately again. Pulse with normal rate and rhythm on my exam. He had poor grip strength in both hands but appeared equal and no obvious facial asymmetry. He noted that family had been out of town and he had been  staying on his own for a couple of days. He endorsed taking all of his medications this morning.      Physical Exam   Triage Vital Signs: ED Triage Vitals  Encounter Vitals Group     BP 06/13/24 0934 (!) 153/68     Girls Systolic BP Percentile --      Girls Diastolic BP Percentile --      Boys Systolic BP Percentile --      Boys Diastolic BP Percentile --      Pulse Rate 06/13/24 0934 (!) 57     Resp 06/13/24 0934 18     Temp 06/13/24 0934 97.9 F (36.6 C)     Temp Source 06/13/24 0934 Oral     SpO2 06/13/24 0934 98 %     Weight 06/13/24 0938 135 lb (61.2 kg)     Height 06/13/24 0938 5' 7 (1.702 m)     Head Circumference --      Peak Flow --      Pain Score 06/13/24 0937 7     Pain Loc --      Pain Education --      Exclude from Growth Chart --     Most recent vital signs: Vitals:   06/13/24 1400 06/13/24 1450  BP: (!) 166/75   Pulse: (!) 54   Resp: 12   Temp:  98 F (36.7 C)  SpO2: 99%      General: Awake, interactive  CV:  Bradycardia, good peripheral perfusion Resp:  Unlabored respirations, lungs  clear to auscultation Abd:  Nondistended, soft, nontender Neuro:  Symmetric facial movement, fluid speech, 5 out of 5 strength in the bilateral upper and lower extremities with normal sensation   ED Results / Procedures / Treatments   Labs (all labs ordered are listed, but only abnormal results are displayed) Labs Reviewed  COMPREHENSIVE METABOLIC PANEL WITH GFR - Abnormal; Notable for the following components:      Result Value   CO2 21 (*)    Glucose, Bld 113 (*)    BUN 53 (*)    Creatinine, Ser 2.37 (*)    GFR, Estimated 26 (*)    All other components within normal limits  CBC - Abnormal; Notable for the following components:   RBC 3.14 (*)    Hemoglobin 9.8 (*)    HCT 30.2 (*)    RDW 15.8 (*)    All other components within normal limits  TROPONIN I (HIGH SENSITIVITY)  TROPONIN I (HIGH SENSITIVITY)     EKG EKG independently reviewed and  interpreted by myself demonstrates:  EKG demonstrates sinus bradycardia at a rate of 51, PR 300, QRS 90, QTc 412, no acute ST changes  RADIOLOGY Imaging independently reviewed and interpreted by myself demonstrates:   Formal Radiology Read:  No results found.  PROCEDURES:  Critical Care performed: No  Procedures   MEDICATIONS ORDERED IN ED: Medications - No data to display   IMPRESSION / MDM / ASSESSMENT AND PLAN / ED COURSE  I reviewed the triage vital signs and the nursing notes.  Differential diagnosis includes, but is not limited to, arrhythmia, anemia, electrolyte abnormality, hypovolemia, ACS  Patient's presentation is most consistent with acute presentation with potential threat to life or bodily function.  88 year old male presenting to the emergency department following an episode of unresponsiveness with hypotension.  Blood pressure much improved here.  Labs with stable anemia and renal dysfunction.  Negative troponin x 2.  However, patient with concerning clinical history with 2 episodes of near syncope/syncope.  Does have prolonged PR interval on EKG.  I did strongly recommend admission for further evaluation.  Patient was initially agreeable, but then later reported he wished to be discharged.  He reports he has an echocardiogram scheduled for tomorrow morning.  I did discuss that he could have worsening including fatal arrhythmia or other significant pathology and he did demonstrate decision-making capacity.  The patient wants leave against medical advise. I spoke with the patient extensively regarding the risks of leaving prior to completion of an appropriate workup and interventions given their symptomatology. We discussed possibility of significant morbidity and death that may occur as a result of them leaving. The patient expressed understanding of the situation and items mentioned, however would like to leave our care.   I have determined that the patient has the  capacity for this medical decision. I have discussed the risks and benefits of the proposed treatment and treatment alternatives including non-treatment. I have offered an open invitation to the patient to return at any time for care. I have determined that the patient understands this discussion and that the patient willingly assumes the potential risk to their well-being as a result of this informed refusal.      FINAL CLINICAL IMPRESSION(S) / ED DIAGNOSES   Final diagnoses:  Syncope and collapse  Hypotension, unspecified hypotension type     Rx / DC Orders   ED Discharge Orders     None        Note:  This document  was prepared using Conservation officer, historic buildings and may include unintentional dictation errors.   Levander Slate, MD 06/13/24 [phone]

## 2024-06-13 NOTE — Progress Notes (Deleted)
 Hematology/Oncology Consult note [facility]  [phone] [phone]  Patient Care Team: Donal Channing SQUIBB, FNP as PCP - General (Family Medicine) End, Lonni, MD as PCP - Cardiology (Cardiology) Cindie Ole DASEN, MD as PCP - Electrophysiology (Cardiology) Melanee Annah BROCKS, MD as Consulting Physician (Oncology)   Name of the patient: Rodney Wiley  [id number]  Dec 14, 1935   Date of visit: 06/13/24  Diagnosis-anemia of chronic kidney disease  Chief complaint/ Reason for visit-routine follow-up of anemia of chronic kidney disease  Heme/Onc history:  patient is a 88 year old male with a past medical history significant for hypertension, hyperlipidemia, stage III CKD and history of aortic insufficiency as well as ascending aortic aneurysm.  He recently underwent bioprosthetic aortic valve replacement, CABG x3 and resuspension of the left atrium on 02/22/2018.    Results of blood work in June 2021 were consistent with iron  deficiency along with CKD. He received venofer  in July 2021.  He has not required any EPO injections so far   Patient's hemoglobin was stable between 10-11 up until August 2024.  He had 2 back-to-back hospitalizations for syncope, hyponatremia and nausea vomiting.  Following that his hemoglobin has been around 8.  Patient was started on Aranesp  and presently gets it every 2 weeks.   Results of bone marrow biopsy from 01/30/2024 did not show any evidence of primary bone marrow disorder.  Hypercellular bone marrow with trilineage hematopoiesis with orderly maturation all 3 cell lines without overt dysplasia.  Blasts were not increased.  No evidence of ring sideroblasts.  Storage iron  present.  Cytogenetics showed loss of Y chromosome.        Interval history- ***  ECOG PS- *** Pain scale- *** Opioid associated constipation- ***  Review of systems- Review of Systems  Constitutional:  Negative for chills, fever,  malaise/fatigue and weight loss.  HENT:  Negative for congestion, ear discharge and nosebleeds.   Eyes:  Negative for blurred vision.  Respiratory:  Negative for cough, hemoptysis, sputum production, shortness of breath and wheezing.   Cardiovascular:  Negative for chest pain, palpitations, orthopnea and claudication.  Gastrointestinal:  Negative for abdominal pain, blood in stool, constipation, diarrhea, heartburn, melena, nausea and vomiting.  Genitourinary:  Negative for dysuria, flank pain, frequency, hematuria and urgency.  Musculoskeletal:  Negative for back pain, joint pain and myalgias.  Skin:  Negative for rash.  Neurological:  Negative for dizziness, tingling, focal weakness, seizures, weakness and headaches.  Endo/Heme/Allergies:  Does not bruise/bleed easily.  Psychiatric/Behavioral:  Negative for depression and suicidal ideas. The patient does not have insomnia.       No Known Allergies   Past Medical History:  Diagnosis Date   Anemia    Aortic insufficiency    a. 02/2018 s/p bioprosthetic AVR; 04/2018 Echo: EF 50-55%, Gr2 DD, Ao bioprosthesis, mean grad , Ao root/Asc Ao nl in size, mild MR, mildly dil LA, nl RV fxn. Nl PASP.   Arthropathy    Ascending aortic aneurysm (HCC)    a. 12/2016 MRA: 4.3cm @ sinus of valsalva, 4.9cm above sinotubular jxn; b. 02/2018 s/p biological Bentall and AVR.   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 02/2018 Cath: LAD 20ost, 40p, 45m, LCX 60p, OM2 60, OM3 85, RCA 10p/m w/ L->R and R->R collats;  b. 02/2018 CABG x 3: LIMA to LAD, SVG to OM2, SVG to PDA, EVH via right thigh.   CKD (chronic kidney disease), stage III (  HCC)    Diverticulitis    Essential hypertension    GERD (gastroesophageal reflux disease)    Hemorrhoids    History of chicken pox    History of Helicobacter pylori infection 06/2007   Hyperlipidemia    Hypertension    Left renal artery stenosis (HCC)    a. 01/2017 RA u/s: moderate L RAS.    Lower extremity edema    Nonrheumatic mitral (valve) insufficiency    a. 12/2016 Echo: mild to mod MR; b. 09/2017 TEE: mild to mod MR; c. 04/2018 Echo: Mild MR.   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 s/p 21 mm Edwards Inspiris Resilia stented bovine pericardial tissue valve and 24 mm Gelweave Valsalva synthetic root conduit with reimplantation of left main coronary artery.     Past Surgical History:  Procedure Laterality Date   AORTIC VALVE REPAIR N/A 02/22/2018   Procedure: AORTIC VALVE REPLACEMENT -Biological Bentall aortic root replacement,;  Surgeon: Dusty Sudie DEL, MD;  Location: [facility] OR;  Service: Open Heart Surgery;  Laterality: N/A;   bypass surgery     CARDIAC CATHETERIZATION     01/16/2018   COLONOSCOPY  2011   by Dr. Ora with findings of diverticulosis   CORONARY ARTERY BYPASS GRAFT N/A 02/22/2018   Procedure: CORONARY ARTERY BYPASS GRAFTING (CABG) x three, using left internal mammary artery and right leg greater saphenous vein harvested endoscopically;  Surgeon: Dusty Sudie DEL, MD;  Location: [facility] OR;  Service: Open Heart Surgery;  Laterality: N/A;   CORONARY/GRAFT ANGIOGRAPHY N/A 01/16/2018   Procedure: CORONARY/GRAFT ANGIOGRAPHY;  Surgeon: Mady Bruckner, MD;  Location: [facility] INVASIVE CV LAB;  Service: Cardiovascular;  Laterality: N/A;   ELBOW SURGERY Left    hemorhoidectomy     IR BONE MARROW BIOPSY & ASPIRATION  01/30/2024   IR CYSTOSTOMY TUBE PLACEMENT/BLADDER ASPIRATION  05/22/2024   RIGHT HEART CATH N/A 01/16/2018   Procedure: RIGHT HEART CATH;  Surgeon: Mady Bruckner, MD;  Location: [facility] INVASIVE CV LAB;  Service: Cardiovascular;  Laterality: N/A;   SHOULDER ARTHROSCOPY WITH ROTATOR CUFF REPAIR Right    SHOULDER SURGERY Right    TEE WITHOUT CARDIOVERSION N/A 09/27/2017   Procedure: TRANSESOPHAGEAL ECHOCARDIOGRAM (TEE);  Surgeon: Maranda Leim DEL, MD;   Location: [facility] ENDOSCOPY;  Service: Cardiovascular;  Laterality: N/A;   TEE WITHOUT CARDIOVERSION N/A 02/22/2018   Procedure: TRANSESOPHAGEAL ECHOCARDIOGRAM (TEE);  Surgeon: Dusty Sudie DEL, MD;  Location: [facility] OR;  Service: Open Heart Surgery;  Laterality: N/A;   THORACIC AORTIC ANEURYSM REPAIR N/A 02/22/2018   Procedure: THORACIC ASCENDING ANEURYSM REPAIR (AAA) Resection of ascending aorta aneurysm;  Surgeon: Dusty Sudie DEL, MD;  Location: [facility] OR;  Service: Open Heart Surgery;  Laterality: N/A;    Social History   Socioeconomic History   Marital status: Widowed    Spouse name: Not on file   Number of children: Not on file   Years of education: Not on file   Highest education level: Not on file  Occupational History   Not on file  Tobacco Use   Smoking status: Never   Smokeless tobacco: Never  Vaping Use   Vaping status: Never Used  Substance and Sexual Activity   Alcohol  use: No   Drug use: No   Sexual activity: Not Currently  Other Topics Concern   Not on file  Social History Narrative   Not on file   Social Drivers of Health  Financial Resource Strain: Patient Declined (09/01/2023)   Received from [facility]   Overall Financial Resource Strain (CARDIA)    Difficulty of Paying Living Expenses: Patient declined  Food Insecurity: No Food Insecurity (09/01/2023)   Received from [facility]   Hunger Vital Sign    Within the past 12 months, you worried that your food would run out before you got the money to buy more.: Never true    Within the past 12 months, the food you bought just didn't last and you didn't have money to get more.: Never true  Transportation Needs: No Transportation Needs (09/01/2023)   Received from [facility]   PRAPARE - Transportation    Lack of Transportation (Medical): No    Lack of Transportation (Non-Medical): No  Physical Activity: Not on file  Stress: Not on file  Social Connections: Not on file  Intimate Partner Violence: Not At  Risk (07/26/2023)   Humiliation, Afraid, Rape, and Kick questionnaire    Fear of Current or Ex-Partner: No    Emotionally Abused: No    Physically Abused: No    Sexually Abused: No    Family History  Problem Relation Age of Onset   Hypertension Mother    Stroke Mother    Leukemia Father    Heart attack Paternal Grandmother    Stroke Paternal Grandfather    Heart Problems Son 56   Heart Problems Son 82   Uterine cancer Sister    Prostate cancer Brother    Colon cancer Brother    Kidney failure Brother    Stroke Sister      Current Outpatient Medications:    aspirin  EC 81 MG tablet, Take 81 mg by mouth daily., Disp: , Rfl:    atorvastatin  (LIPITOR) 40 MG tablet, Take 1 tablet (40 mg total) by mouth daily., Disp: 90 tablet, Rfl: 3   cholecalciferol (VITAMIN D3) 25 MCG (1000 UT) tablet, Take 1,000 Units by mouth daily., Disp: , Rfl:    isosorbide  mononitrate (IMDUR ) 30 MG 24 hr tablet, Take 1 tablet (30 mg total) by mouth daily., Disp: 90 tablet, Rfl: 3   lisinopril  (ZESTRIL ) 5 MG tablet, Take 1 tablet (5 mg total) by mouth daily., Disp: 90 tablet, Rfl: 3   oxyCODONE  (OXY IR/ROXICODONE ) 5 MG immediate release tablet, Take 5 mg by mouth every 6 (six) hours as needed., Disp: , Rfl:    pantoprazole  (PROTONIX ) 40 MG tablet, Take 1 tablet (40 mg total) by mouth daily., Disp: 30 tablet, Rfl: 1   tamsulosin  (FLOMAX ) 0.4 MG CAPS capsule, Take 1 capsule (0.4 mg total) by mouth daily., Disp: 30 capsule, Rfl: 11   torsemide  (DEMADEX ) 10 MG tablet, TAKE 1 TABLET BY MOUTH EVERY DAY AS NEEDED, Disp: 90 tablet, Rfl: 0   traMADol  (ULTRAM ) 50 MG tablet, Take 50 mg by mouth every 6 (six) hours as needed., Disp: , Rfl:   Physical exam: There were no vitals filed for this visit. Physical Exam Cardiovascular:     Rate and Rhythm: Normal rate and regular rhythm.     Heart sounds: Normal heart sounds.  Pulmonary:     Effort: Pulmonary effort is normal.     Breath sounds: Normal breath sounds.   Abdominal:     General: Bowel sounds are normal.     Palpations: Abdomen is soft.  Skin:    General: Skin is warm and dry.  Neurological:     Mental Status: He is alert and oriented to person, place,  and time.      I have personally reviewed labs listed below:    Latest Ref Rng & Units 04/05/2024    3:13 PM  CMP  Glucose 70 - 99 mg/dL 897   BUN 8 - 23 mg/dL 46   Creatinine 9.38 - 1.24 mg/dL 7.51   Sodium 864 - 854 mmol/L 132   Potassium 3.5 - 5.1 mmol/L 4.2   Chloride 98 - 111 mmol/L 107   CO2 22 - 32 mmol/L 18   Calcium  8.9 - 10.3 mg/dL 8.3   Total Protein 6.5 - 8.1 g/dL 6.4   Total Bilirubin 0.0 - 1.2 mg/dL 0.8   Alkaline Phos 38 - 126 U/L 45   AST 15 - 41 U/L 18   ALT 0 - 44 U/L 13       Latest Ref Rng & Units 05/22/2024    7:55 AM  CBC  WBC 4.0 - 10.5 K/uL 5.9   Hemoglobin 13.0 - 17.0 g/dL 8.7   Hematocrit 60.9 - 52.0 % 26.4   Platelets 150 - 400 K/uL 162    I have personally reviewed Radiology images listed below: No images are attached to the encounter.  IR CYSTOSTOMY TUBE PLACEMENT/BLADDER ASPIRATION Result Date: 05/22/2024 INDICATION: 88 year old male with chronic urinary retention. He requires suprapubic catheter placement. EXAM: Suprapubic catheter placement COMPARISON:  None Available. MEDICATIONS: None. ANESTHESIA/SEDATION: Moderate (conscious) sedation was employed during this procedure. A total of Versed  1 mg and Fentanyl  50 mcg was administered intravenously by the radiology nurse. Total intra-service moderate Sedation Time: 10 minutes. The patient's level of consciousness and vital signs were monitored continuously by radiology nursing throughout the procedure under my direct supervision. CONTRAST:  10 mL Omnipaque  300-administered into the collecting system(s) FLUOROSCOPY: Radiation Exposure Index (as provided by the fluoroscopic device): 69 mGy Kerma COMPLICATIONS: None immediate. PROCEDURE: Informed written consent was obtained from the patient after a  thorough discussion of the procedural risks, benefits and alternatives. All questions were addressed. Maximal Sterile Barrier Technique was utilized including caps, mask, sterile gowns, sterile gloves, sterile drape, hand hygiene and skin antiseptic. A timeout was performed prior to the initiation of the procedure. Cone beam CT imaging was performed. The bladder is identified. Confirmation may that there is no interposition of colon or bowel between the anterior bladder wall in the anterior abdominal wall. A suitable skin entry site was selected and marked. Local anesthesia was attained by infiltration with 1% lidocaine . A small dermatotomy was made. Under fluoroscopic guidance, an 18 gauge trocar needle was advanced through the abdominal wall and into the bladder. Contrast was injected through the needle confirming its presence within the bladder lumen. A 0.035 wire was then coiled in the bladder. The trocar needle was removed. A 16 [place] tip Foley catheter was placed coaxially over an 8 x 80 mm low compliance balloon. The balloon and catheter assembly was then advanced over the wire with the balloon spanning the trans cutaneous tract. The balloon was then inflated to full effacement. As the balloon was deflated, the assembly was pushed over the wire and into the bladder. The retention balloon of the Foley catheter was inflated to 10 mL with sterile saline. The balloon was deflated and the balloon and wire were removed. The Foley catheter was pulled back in till the fixation balloon was snugly in place. Contrast injected through the Foley catheter confirms placement within the bladder. IMPRESSION: Successful balloon assisted placement of a 19 [place] tip Foley catheter via the suprapubic  route. Return to interventional Radiology in 6-8 weeks for initial catheter exchange. Electronically Signed   By: Wilkie Lent M.D.   On: 05/22/2024 12:09     Assessment and plan- Patient is a 88 y.o. male  with history of anemia of chronic kidney disease here for routine follow-up  Patient last received Aranesp  70 mcg on 05/18/2024.  He was supposed to receive this every 2 weeks.  We will restart it at this time.   Visit Diagnosis 1. Anemia of chronic kidney failure, stage 3 (moderate) (HCC)   2. Erythropoietin (EPO) stimulating agent anemia management patient      Dr. Annah Skene, MD, MPH [facility] at [facility] [phone] 06/13/2024 7:59 AM

## 2024-06-13 NOTE — ED Triage Notes (Signed)
 Brought in by [facility] from [facility] nephrology [place] for low blood pressure. EMS reports when they first arrived to clinic he was pale and diaphoretic   EMS reports patient is A&O x4  EMS vitals: 114/61 b/p 49HR

## 2024-06-13 NOTE — ED Notes (Signed)
 Fall precautions in place for Pt. This RN placed fall band, fall grip socks, bed alarm and fall sign.

## 2024-06-13 NOTE — ED Notes (Signed)
 Best contact for patients family: Rodney Wiley [phone]

## 2024-06-13 NOTE — Discharge Instructions (Signed)
 You were seen in the ER today for poor responsiveness and low blood pressure.  We recommended keeping you in the hospital for additional testing, but you did choose to leave against medical advice.  You are welcome to return at any time to continue your workup.  Otherwise, please follow-up closely with your primary care doctor and cardiologist for further evaluation.  Return immediately for any new or worsening symptoms.

## 2024-06-14 ENCOUNTER — Ambulatory Visit

## 2024-06-18 ENCOUNTER — Telehealth: Payer: Self-pay | Admitting: Internal Medicine

## 2024-06-18 ENCOUNTER — Encounter: Payer: Self-pay | Admitting: Internal Medicine

## 2024-06-18 NOTE — Telephone Encounter (Signed)
 Pt c/o BP issue: STAT if pt c/o blurred vision, one-sided weakness or slurred speech.  STAT if BP is GREATER than 180/120 TODAY.  STAT if BP is LESS than 90/60 and SYMPTOMATIC TODAY  1. What is your BP concern?  Patient's BP went from 185/95 and an hour later 88/44  2. Have you taken any BP medication today?  No  3. What are your last 5 BP readings?  185/95 106/53 88/44 86/42  123/55  4. Are you having any other symptoms (ex. Dizziness, headache, blurred vision, passed out)?   Feels cold and then breaks out in a sweat  Daughter-in-law Raelene) stated patient has been having up and down BP readings for several weeks but has got progressive worse.  [name] state patient went to ED last week.

## 2024-06-18 NOTE — Progress Notes (Unsigned)
 Cardiology Office Note   Date:  06/19/2024  ID:  Rodney Wiley, DOB December 14, 1935, MRN [id number] PCP: Donal Channing SQUIBB, FNP  [facility] Providers Cardiologist:  Lonni Hanson, MD Electrophysiologist:  OLE ONEIDA HOLTS, MD   History of Present Illness Rodney Wiley is a 88 y.o. male with history of CAD s/p CABG in 2009, thoracic aortic aneurysm and aortic regurgitation status post Bentall procedure with bio prosthetic aortic valve replacement at the time of CABG, chronic HFpEF, SVT, frequent PVCs, resistant hypertension, CKD stage IV with possible left renal artery stenosis presents for follow-up.   The patient underwent CABG and bioprosthetic AVR in 02/2018 with LIMA to LAD, SVG to OM2 and SVG to PDA. 24mm synthetic aortic root graft and 21mm Edwards Inspiris Roselia stented bovine pericardial tissue valve was used. EF shortly after was 50-55%, G2DD. Heart monitor 2020 showed NSR, PACs, frequent PVCs and runs of atrial tachycardia, PVC burden 8.4%. The patient was started on amiodarone . BB was stopped due to 1st degree AV block. Echo 02/2023 showed normal LVEF 55-60%, mild LVH, G1DD, normal mitral valve, aortic valve mean gradient 18.3mmHg.   The patient presented to the ER on 07/26/2023 with weakness, lightheadedness, elevated blood pressures, nausea and diaphoresis for the last few weeks.  High-sensitivity troponin elevated to 26.  EKG was nonacute.  Echo showed normal LV function, grade 2 diastolic dysfunction, moderate MR. Telemetry was unremarkable. Blood  Pressure medications were adjusted.   He was admitted at [facility] in September 2024 with hyponatremia secondary to SIADH, dyspnea, dysphagia with severe GERD (nausea and vomiting), normocytic anemia. He underwent extensive work-up to r/o underlying malignancy resulting in SIADGE. Barium swallow showed severe GERD and esophageal dysmotility, PPI was increased.    Patient was last seen 05/28/24 reporting lower leg edema. Torsemide  was  increased for 3 days, then back down to 20mg  daily. Patient was referred to EP for bradycardia and pSVT on amiodarone .   Today, patient is ok. On 06/13/24 he went to the ER for low BP with unresponsiveness. He was seen in nephrology office sitting down and went unresponsive. Doesn't remember feeling dizzy or lightheaded. He came to after a while and went to the ER. Labs were stable. Patient did not want to stay so he was discharged home.  Since that visit, blood pressure is reportedly labile. BP seems high in the AM and low in the PM. He is taking torsemide , lisinopril  and Imdur  in the AM. Today, BP is 140/62, orthostatics are negative. He denies chest pain, SOB, lower leg edema. He is taking torsemide  10mg  most days.    Studies Reviewed EKG Interpretation Date/Time:  Tuesday June 19 2024 08:32:22 EDT Ventricular Rate:  63 PR Interval:  274 QRS Duration:  92 QT Interval:  390 QTC Calculation: 399 R Axis:   -67  Text Interpretation: Sinus rhythm with 1st degree A-V block Left axis deviation Minimal voltage criteria for LVH, may be normal variant ( Cornell product ) Anteroseptal infarct (cited on or before 27-Oct-2023) When compared with ECG of 13-[name]-2024 09:41, No significant change was found Confirmed by Franchester, Donovin Kraemer ([id number]) on 06/19/2024 8:51:01 AM    Echo 07/2023  1. Left ventricular ejection fraction, by estimation, is 55 to 60%. The  left ventricle has normal function. The left ventricle has no regional  wall motion abnormalities. There is moderate left ventricular hypertrophy.  Left ventricular diastolic  parameters are consistent with Grade II diastolic dysfunction  (pseudonormalization).   2. Right ventricular systolic function is  normal. The right ventricular  size is normal.   3. Left atrial size was mildly dilated.   4. The mitral valve is normal in structure. Moderate mitral valve  regurgitation. No evidence of mitral stenosis.   5. The aortic valve is normal in structure.  Aortic valve regurgitation is  not visualized. Mild aortic valve stenosis. Aortic valve mean gradient  measures 16.4 mmHg. Aortic valve Vmax measures 2.71 m/s.   6. The inferior vena cava is normal in size with greater than 50%  respiratory variability, suggesting right atrial pressure of 3 mmHg.    Echo 02/2023 1. Left ventricular ejection fraction, by estimation, is 55 to 60%. The  left ventricle has normal function. The left ventricle demonstrates  regional wall motion abnormalities (septal wall hypokinesis possibly  secondary to post-operative state). There is   mild left ventricular hypertrophy. Left ventricular diastolic parameters  are consistent with Grade I diastolic dysfunction (impaired relaxation).  The average left ventricular global longitudinal strain is -12.6 %.   2. Right ventricular systolic function is normal. The right ventricular  size is normal. Tricuspid regurgitation signal is inadequate for assessing  PA pressure.   3. Left atrial size was moderately dilated.   4. The mitral valve is normal in structure. No evidence of mitral valve  regurgitation. No evidence of mitral stenosis.   5. The aortic valve has been repaired/replaced. Aortic valve  regurgitation is not visualized. No aortic stenosis is present. There is a  Edwards bioprosthetic valve present in the aortic position. Procedure  Date: 02/2018. Aortic valve mean gradient  measures 18.7 mmHg. Aortic valve Vmax measures 2.95 m/s.   6. The inferior vena cava is normal in size with greater than 50%  respiratory variability, suggesting right atrial pressure of 3 mmHg.    Echo 2022  1. Left ventricular ejection fraction, by estimation, is 60 to 65%. Left  ventricular ejection fraction by 3D volume is 69 %. The left ventricle has  normal function. The left ventricle has no regional wall motion  abnormalities. There is mild left  ventricular hypertrophy. Left ventricular diastolic parameters are  indeterminate.    2. Right ventricular systolic function is low normal. The right  ventricular size is normal. There is normal pulmonary artery systolic  pressure.   3. The mitral valve is normal in structure. Mild to moderate mitral valve  regurgitation.   4. The aortic valve has been repaired/replaced. Aortic valve  regurgitation is not visualized. There is a Edwards bioprosthetic valve  present in the aortic position. Echo findings are consistent with normal  structure and function of the aortic valve  prosthesis. Aortic valve mean gradient measures 20.5 mmHg.   5. The inferior vena cava is normal in size with greater than 50%  respiratory variability, suggesting right atrial pressure of 3 mmHg.       Physical Exam VS:  BP (!) 140/62 (BP Location: Left Arm, Patient Position: Sitting)   Pulse 63   Ht 5' 7 (1.702 m)   Wt 136 lb 3.2 oz (61.8 kg)   SpO2 97%   BMI 21.33 kg/m   Orthostatic VS for the past 24 hrs (Last 3 readings):  BP- Lying Pulse- Lying BP- Sitting Pulse- Sitting BP- Standing at 0 minutes Pulse- Standing at 0 minutes BP- Standing at 3 minutes Pulse- Standing at 3 minutes  06/19/24 0839 180/81 60 170/63 63 154/80 65 173/78 73      Wt Readings from Last 3 Encounters:  06/19/24 136 lb 3.2  oz (61.8 kg)  06/13/24 135 lb (61.2 kg)  05/28/24 142 lb 3.2 oz (64.5 kg)    GEN: Well nourished, well developed in no acute distress NECK: No JVD; No carotid bruits CARDIAC: RRR, no murmurs, rubs, gallops RESPIRATORY:  Clear to auscultation without rales, wheezing or rhonchi  ABDOMEN: Soft, non-tender, non-distended EXTREMITIES:  No edema; No deformity   ASSESSMENT AND PLAN  HTN Patient recently went to the ER for hypotension and unresponsiveness. He was seen in nephrology and BP was low and he had an episdoe of unresponsiveness and was sent to the ER. In the ER BP improved, Labs and EKG were stable. He was asked to stay, but patient refused. Since then, they report labile blood pressures at  home. He is taking torsemide , lisinopril  and Imdur  in the morning. BP today is 140/62, orthostatics negative. I recommended taking Torsemide  10mg  and lisinopril  5mg  in the AM and Imdur  30mg  in the afternoon. We will see the patient back in 2 weeks to re-evalute.   HFpEF The patient is euvolmic on exam. Continue Torsemide  10mg  daily/PRN swelling.   CAD The patient denies anginal symptoms. No further work-up indicated at this time.  Sinus bradycardia pSVT He was previously on amiodarone  100mg  daily for pSVT however insurance stopped covering it and he stopped it. EKG today showed NSR PRI . Patient was referred to EP for recommendations for bradycardia and pSVT.  CKD stage IV SCR 2.37, BUN 53, GFR 26. Overall stable.     Dispo: Follow-up in 2 weeks  Signed, Kevonta Phariss VEAR Fishman, PA-C

## 2024-06-18 NOTE — Telephone Encounter (Signed)
 Called and spoke to [place] (patient daughter in-law) pertaining to recent call,  Daughter in law states pt is very stubborn and left the hospital AMA last week after feeling better, he was seen in nephrologist office last week and was unresponsive in the room and taken to ED via EMS, where he became more responsive and wanted to leave.  DIL states patient still drives, and pops ibuprofen multiple times a day due to joint pain. He is in stages 4 kidney disease and she also states he does not drink enough water throughout the day.   Pt got put on with PA-C tomorrow to follow up on hospital visit and persistent symptoms with low vital signs.   Told DIL to message us  back with patients morning vitals for review and educated on need to go to hospital if symptoms persist along with low vital signs today.  DIL verbalized understanding of information and would call/message.

## 2024-06-19 ENCOUNTER — Encounter: Payer: Self-pay | Admitting: Medical

## 2024-06-19 ENCOUNTER — Ambulatory Visit: Attending: Medical | Admitting: Medical

## 2024-06-19 VITALS — BP 140/62 | HR 63 | Ht 67.0 in | Wt 136.2 lb

## 2024-06-19 DIAGNOSIS — I10 Essential (primary) hypertension: Secondary | ICD-10-CM | POA: Diagnosis not present

## 2024-06-19 DIAGNOSIS — I471 Supraventricular tachycardia, unspecified: Secondary | ICD-10-CM

## 2024-06-19 DIAGNOSIS — R001 Bradycardia, unspecified: Secondary | ICD-10-CM

## 2024-06-19 DIAGNOSIS — I25118 Atherosclerotic heart disease of native coronary artery with other forms of angina pectoris: Secondary | ICD-10-CM | POA: Diagnosis not present

## 2024-06-19 DIAGNOSIS — N184 Chronic kidney disease, stage 4 (severe): Secondary | ICD-10-CM

## 2024-06-19 DIAGNOSIS — I5032 Chronic diastolic (congestive) heart failure: Secondary | ICD-10-CM

## 2024-06-19 NOTE — Patient Instructions (Signed)
 Medication Instructions:  Your physician recommends the following medication changes.  START TAKING: Lisinopril  5 mg by mouth every MORNING  Imdur  30 mg by mouth every EVENING    *If you need a refill on your cardiac medications before your next appointment, please call your pharmacy*  Lab Work: No labs ordered today    Testing/Procedures: No test ordered today   Follow-Up: At [facility], you and your health needs are our priority.  As part of our continuing mission to provide you with exceptional heart care, our providers are all part of one team.  This team includes your primary Cardiologist (physician) and Advanced Practice Providers or APPs (Physician Assistants and Nurse Practitioners) who all work together to provide you with the care you need, when you need it.  Your next appointment:   2 week(s)  Provider:   Mikey Fishman, PA-C

## 2024-06-26 NOTE — Progress Notes (Deleted)
  Electrophysiology Office Note:    Date:  06/26/2024   ID:  PHELIX FUDALA, DOB 11-Mar-1936, MRN [id number]  [facility] Cardiologist:  Lonni Hanson, MD  [facility] Electrophysiologist:  OLE ONEIDA HOLTS, MD   Referring MD: Franchester Mikey DEL, PA-C   Chief Complaint: Bradycardia  History of Present Illness:    Mr. Schonberg is an 88 year old man who I am seeing today for an evaluation of bradycardia at the request of Cadence Furth, PA-C.  The patient has a history of coronary artery disease with prior CABG, Bentall procedure, chronic diastolic heart failure, SVT, frequent PVCs and CKD 4.  In 2020 the patient was started on amiodarone  for frequent PVCs.  The amiodarone  was reportedly stopped because of a possible insurance issue.      Their past medical, social and family history was reviewed.   ROS:   Please see the history of present illness.    All other systems reviewed and are negative.  EKGs/Labs/Other Studies Reviewed:    The following studies were reviewed today:  July 27, 2023 echo EF 55-60 RV normal Moderate MR  June 19, 2024 EKG shows sinus rhythm.  First-degree AV block.  PR 271 ms.  May 28, 2024 EKG shows sinus rhythm.  First-degree AV block.  PR 376 ms.  Narrow QRS.       Physical Exam:    VS:  There were no vitals taken for this visit.    Wt Readings from Last 3 Encounters:  06/19/24 136 lb 3.2 oz (61.8 kg)  06/13/24 135 lb (61.2 kg)  05/28/24 142 lb 3.2 oz (64.5 kg)     GEN: no distress CARD: RRR, No MRG RESP: No IWOB. CTAB.        ASSESSMENT AND PLAN:    No diagnosis found.   #Bradycardia #First-degree AV delay Recommend holding amiodarone  for now.  I suspect this is the main driver of his bradycardia.  Would only restart nodal blockers, etc. if his SVT returned.  The last episode I see of his SVT was in 2020 on EKG.   Follow-up with EP on an as-needed basis  Signed, OLE ONEIDA. HOLTS, MD, [facility], [facility] 06/26/2024 10:27  PM    Electrophysiology [facility]

## 2024-06-27 ENCOUNTER — Ambulatory Visit: Attending: Cardiology | Admitting: Cardiology

## 2024-06-27 ENCOUNTER — Ambulatory Visit: Admitting: Internal Medicine

## 2024-06-27 DIAGNOSIS — I471 Supraventricular tachycardia, unspecified: Secondary | ICD-10-CM

## 2024-06-27 DIAGNOSIS — R001 Bradycardia, unspecified: Secondary | ICD-10-CM

## 2024-06-27 NOTE — Progress Notes (Deleted)
  Cardiology Office Note:  .   Date:  06/27/2024  ID:  Rodney Wiley, DOB 02-Dec-1936, MRN [id number] PCP: Donal Channing SQUIBB, FNP  [facility] Providers Cardiologist:  Lonni Hanson, MD Electrophysiologist:  OLE ONEIDA HOLTS, MD { Click to update primary MD,subspecialty MD or APP then REFRESH:1}    History of Present Illness: .   Rodney Wiley is a 88 y.o. male with history of coronary artery disease status post CABG (02/2018), thoracic aortic aneurysm and aortic regurgitation status post Bentall procedure with bioprosthetic aortic valve replacement (02/2018), chronic HFpEF, SVT and frequent PACs, resistant hypertension, and chronic kidney disease stage IV with question of left renal artery stenosis, who presents for follow-up of labile hypertension and HFpEF.  I had seen him in late May, which time his blood pressure was borderline low.  We agreed to discontinue hydralazine .  He was seen a month later by Cadence Furth, PA, at which time blood pressure was still soft and both amlodipine  and amiodarone  were stopped (his insurance was no longer covering the amiodarone ).  Isosorbide  mononitrate was also reduced.  Due to significant lower extremity edema, his torsemide  was temporarily increased.  About 2 weeks later, he was taken to the emergency room because of hypotension and unresponsiveness.  However, initial triage BP in the ED was 153/68.  He remained hypertensive throughout his ED stay.  He was seen in follow-up by Ms. Furth a week ago, at which time Rodney Wiley reported continued labile blood pressures.  He was advised to take torsemide  10 mg daily and lisinopril  5 mg every morning as well as isosorbide  mononitrate 30 mg in the afternoon.  Echo is scheduled for 07/19/2024  ROS: See HPI  Studies Reviewed: .        *** Risk Assessment/Calculations:   {Does this patient have ATRIAL FIBRILLATION?:[phone]} No BP recorded.  {Refresh Note OR Click here to enter BP  :1}***       Physical  Exam:   VS:  There were no vitals taken for this visit.   Wt Readings from Last 3 Encounters:  06/19/24 136 lb 3.2 oz (61.8 kg)  06/13/24 135 lb (61.2 kg)  05/28/24 142 lb 3.2 oz (64.5 kg)    General:  NAD. Neck: No JVD or HJR. Lungs: Clear to auscultation bilaterally without wheezes or crackles. Heart: Regular rate and rhythm without murmurs, rubs, or gallops. Abdomen: Soft, nontender, nondistended. Extremities: No lower extremity edema.  ASSESSMENT AND PLAN: .    ***    {Are you ordering a CV Procedure (e.g. stress test, cath, DCCV, TEE, etc)?   Press F2        :[id number]}  Dispo: ***  Signed, Lonni Hanson, MD

## 2024-07-05 ENCOUNTER — Encounter: Payer: Self-pay | Admitting: Oncology

## 2024-07-06 ENCOUNTER — Inpatient Hospital Stay: Attending: Oncology

## 2024-07-06 ENCOUNTER — Encounter: Payer: Self-pay | Admitting: Oncology

## 2024-07-06 ENCOUNTER — Inpatient Hospital Stay: Admitting: Nurse Practitioner

## 2024-07-06 ENCOUNTER — Telehealth: Payer: Self-pay | Admitting: Oncology

## 2024-07-06 NOTE — Telephone Encounter (Signed)
 Called pt to reschedule missed appt from today - no answer - vm full

## 2024-07-06 NOTE — Telephone Encounter (Signed)
 Called pt son to get different number for pt - vm full - LH

## 2024-07-19 ENCOUNTER — Ambulatory Visit: Attending: Medical

## 2024-07-19 DIAGNOSIS — R600 Localized edema: Secondary | ICD-10-CM

## 2024-07-19 LAB — ECHOCARDIOGRAM COMPLETE
AR max vel: 0.74 cm2
AV Area VTI: 0.78 cm2
AV Area mean vel: 0.77 cm2
AV Mean grad: 20 mmHg
AV Peak grad: 38.9 mmHg
Ao pk vel: 3.12 m/s
Area-P 1/2: 3.13 cm2
MV VTI: 1.3 cm2
S' Lateral: 2.78 cm

## 2024-07-27 ENCOUNTER — Other Ambulatory Visit: Payer: Self-pay | Admitting: Interventional Radiology

## 2024-07-27 DIAGNOSIS — R339 Retention of urine, unspecified: Secondary | ICD-10-CM

## 2024-07-30 ENCOUNTER — Ambulatory Visit
Admission: RE | Admit: 2024-07-30 | Discharge: 2024-07-30 | Disposition: A | Discharge status: Home / Self Care | Source: Ambulatory Visit | Attending: Interventional Radiology | Admitting: Interventional Radiology

## 2024-07-30 DIAGNOSIS — R339 Retention of urine, unspecified: Secondary | ICD-10-CM | POA: Diagnosis present

## 2024-07-30 HISTORY — PX: IR CATHETER TUBE CHANGE: IMG717

## 2024-07-30 MED ORDER — IOHEXOL 300 MG/ML  SOLN
10.0000 mL | Freq: Once | INTRAMUSCULAR | Status: AC | PRN
  Administered 2024-07-30: 10 mL

## 2024-07-30 MED ORDER — LIDOCAINE VISCOUS HCL 2 % MT SOLN
OROMUCOSAL | Status: AC
Start: 2024-07-30 — End: 2024-07-30
  Filled 2024-07-30: qty 15

## 2024-08-01 ENCOUNTER — Ambulatory Visit: Attending: Internal Medicine | Admitting: Internal Medicine

## 2024-08-01 ENCOUNTER — Encounter: Payer: Self-pay | Admitting: Internal Medicine

## 2024-08-01 VITALS — BP 138/70 | HR 69 | Ht 65.0 in | Wt 138.4 lb

## 2024-08-01 DIAGNOSIS — I491 Atrial premature depolarization: Secondary | ICD-10-CM

## 2024-08-01 DIAGNOSIS — I25118 Atherosclerotic heart disease of native coronary artery with other forms of angina pectoris: Secondary | ICD-10-CM

## 2024-08-01 DIAGNOSIS — I10 Essential (primary) hypertension: Secondary | ICD-10-CM

## 2024-08-01 DIAGNOSIS — N189 Chronic kidney disease, unspecified: Secondary | ICD-10-CM

## 2024-08-01 DIAGNOSIS — E785 Hyperlipidemia, unspecified: Secondary | ICD-10-CM

## 2024-08-01 DIAGNOSIS — I7121 Aneurysm of the ascending aorta, without rupture: Secondary | ICD-10-CM

## 2024-08-01 DIAGNOSIS — Z952 Presence of prosthetic heart valve: Secondary | ICD-10-CM

## 2024-08-01 DIAGNOSIS — I5032 Chronic diastolic (congestive) heart failure: Secondary | ICD-10-CM | POA: Diagnosis not present

## 2024-08-01 DIAGNOSIS — I471 Supraventricular tachycardia, unspecified: Secondary | ICD-10-CM

## 2024-08-01 DIAGNOSIS — D631 Anemia in chronic kidney disease: Secondary | ICD-10-CM

## 2024-08-01 DIAGNOSIS — Z79899 Other long term (current) drug therapy: Secondary | ICD-10-CM

## 2024-08-01 NOTE — Patient Instructions (Signed)
 Medication Instructions:  Your physician recommends that you continue on your current medications as directed. Please refer to the Current Medication list given to you today.    *If you need a refill on your cardiac medications before your next appointment, please call your pharmacy*  Lab Work: Your provider would like for you to have following labs drawn today CBC, BMP, Mg.     Testing/Procedures: No test ordered today   Follow-Up: At [facility], you and your health needs are our priority.  As part of our continuing mission to provide you with exceptional heart care, our providers are all part of one team.  This team includes your primary Cardiologist (physician) and Advanced Practice Providers or APPs (Physician Assistants and Nurse Practitioners) who all work together to provide you with the care you need, when you need it.  Your next appointment:   1 month(s)  Provider:   You may see Lonni Hanson, MD or one of the following Advanced Practice Providers on your designated Care Team:   Lonni Meager, NP Lesley Maffucci, PA-C Bernardino Bring, PA-C Cadence [place]agonah, PA-C Tylene Lunch, NP Barnie Hila, NP

## 2024-08-01 NOTE — Progress Notes (Unsigned)
 Cardiology Office Note:  .   Date:  08/03/2024  ID:  Rodney Wiley, DOB [birth year], MRN [id number] PCP: Rodney Channing SQUIBB, FNP  [facility] Providers Cardiologist:  Lonni Hanson, MD Electrophysiologist:  Rodney ONEIDA HOLTS, MD     History of Present Illness: .   Rodney Wiley is a 88 y.o. male with history of coronary artery disease status post CABG (02/2018), thoracic aortic aneurysm and aortic regurgitation status post Bentall procedure with bioprosthetic aortic valve replacement (02/2018), chronic HFpEF, SVT and frequent PACs, resistant hypertension, and chronic kidney disease stage IV with question of left renal artery stenosis, who presents for follow-up of coronary artery disease, hypertension, and aortic/aortic valve disease.  He was last seen in our office in 06/2024 by Rodney Furth, NP, for evaluation of soft blood pressure.  Blood pressure at office visit was 140/62; he was advised to begin taking lisinopril  5 mg every morning and isosorbide  mononitrate 30 mg every afternoon.  Today, [name] reports that he has been having irregular blood pressures.  He feels like they are high at times, but provides readings of 110/90.  He endorses hazy vision when he feels that his BP is elevated but is otherwise without symptoms.  He denies chest pain, shortness of breath, palpitations, and lightheadedness.  He has waxing and waning edema, currently using torsemide  daily or every other day based on how swollen his legs are.  ROS: See HPI  Studies Reviewed: SABRA   EKG Interpretation Date/Time:  Wednesday August 01 2024 11:55:48 EDT Ventricular Rate:  69 PR Interval:  328 QRS Duration:  98 QT Interval:  422 QTC Calculation: 452 R Axis:   -61  Text Interpretation: Sinus rhythm with 1st degree A-V block with Premature supraventricular complexes Left axis deviation Minimal voltage criteria for LVH, may be normal variant ( Cornell product ) Anteroseptal infarct (cited on or before  27-Oct-2023) Abnormal ECG When compared with ECG of 19-[name]-2024 08:32, Premature supraventricular complexes are now Present QT has lengthened Confirmed by Rodney Wiley [phone]) on 08/03/2024 2:08:54 PM    TTE (07/19/2024):  1. Left ventricular ejection fraction, by estimation, is 60 to 65%. The  left ventricle has normal function. The left ventricle has no regional  wall motion abnormalities. There is moderate left ventricular hypertrophy.  Left ventricular diastolic  parameters are indeterminate. The average left ventricular global  longitudinal strain is -13.5 %. The global longitudinal strain is  abnormal.   2. Right ventricular systolic function is normal. The right ventricular  size is normal.   3. Left atrial size was moderately dilated.   4. The mitral valve is normal in structure. Moderate mitral valve  regurgitation. No evidence of mitral stenosis.   5. The aortic valve has been repaired/replaced, edwards Sapien  bioprosthetic aortic valve 02/2018. Aortic valve regurgitation is not  visualized. Aortic valve area, by VTI measures 0.78 cm. Aortic valve mean  gradient measures 20.0 mmHg. Aortic valve Vmax   measures 3.12 m/s.   6. The inferior vena cava is normal in size with greater than 50%  respiratory variability, suggesting right atrial pressure of 3 mmHg.   Risk Assessment/Calculations:         Physical Exam:   VS:  BP 138/70 (BP Location: Left Arm, Cuff Size: Normal)   Pulse 69   Ht 5' 5 (1.651 m)   Wt 138 lb 6.4 oz (62.8 kg)   SpO2 98%   BMI 23.03 kg/m    Wt Readings from Last 3  Encounters:  08/01/24 138 lb 6.4 oz (62.8 kg)  06/19/24 136 lb 3.2 oz (61.8 kg)  06/13/24 135 lb (61.2 kg)    General:  NAD. Neck: No JVD or HJR. Lungs: Clear to auscultation bilaterally without wheezes or crackles. Heart: Regular rate and rhythm with occasional extrasystoles and 2/6 systolic murmur. Abdomen: Soft, nontender, nondistended. Extremities: trace pretibial edema  bilaterally  ASSESSMENT AND PLAN: .    CAD: No angina reported.  Continue secondary prevention with ASA and atorvastatin , as well as antianginal tx with isosorbide  mononitrate.  Chronic HFpEF: Mild LE edema noted on exam today.  [name] reports fluid has been fairly well-controlled with self-titration of torsemide .  We will plan to continue with his current regimen of torsemide  10 mg daily as needed.  Thoracic aortic aneurysm and aortic regurgitation s/p bio-Bentall procedure: Symptoms have been stable.  Recent echo showed mean gradient of 20 mmHg, up from 16 mmHg in 07/2023.  No AI noted.  Continue clinical follow-up and ASA.  Hypertension: BP mildly elevated today.  [name] reports some elevated readings at home, though the values he provides today seem non-physiologic.  He denies any low readings.  I will check a BMP today; if renal function and electrolytes are stable, we will plan to increase lisinopril  to 10 mg daily.  Continue Imdur  30 mg daily.  Hyperlipidemia: Continue atorvastatin  40 mg daily.  PSVT and PAC's: PAC's noted again today.  No palpitations reported.  Defer AV nodal blocking agents in the setting of 1st degree AV block.  Check BMP and Mg today.  Anemia: Repeat CBC today.    Dispo: Return to clinic in 1 month.  Signed, Lonni Hanson, MD

## 2024-08-02 ENCOUNTER — Ambulatory Visit: Payer: Self-pay | Admitting: Internal Medicine

## 2024-08-02 LAB — BASIC METABOLIC PANEL WITH GFR
BUN/Creatinine Ratio: 21 (ref 10–24)
BUN: 47 mg/dL — ABNORMAL HIGH (ref 8–27)
CO2: 18 mmol/L — ABNORMAL LOW (ref 20–29)
Calcium: 9.3 mg/dL (ref 8.6–10.2)
Chloride: 101 mmol/L (ref 96–106)
Creatinine, Ser: 2.21 mg/dL — ABNORMAL HIGH (ref 0.76–1.27)
Glucose: 84 mg/dL (ref 70–99)
Potassium: 4.7 mmol/L (ref 3.5–5.2)
Sodium: 134 mmol/L (ref 134–144)
eGFR: 28 mL/min/1.73 — ABNORMAL LOW (ref >59)

## 2024-08-02 LAB — CBC
Hematocrit: 29.2 % — ABNORMAL LOW (ref 37.5–51.0)
Hemoglobin: 9.4 g/dL — ABNORMAL LOW (ref 13.0–17.7)
MCH: 31.9 pg (ref 26.6–33.0)
MCHC: 32.2 g/dL (ref 31.5–35.7)
MCV: 99 fL — ABNORMAL HIGH (ref 79–97)
Platelets: 176 x10E3/uL (ref 150–450)
RBC: 2.95 x10E6/uL — ABNORMAL LOW (ref 4.14–5.80)
RDW: 14.3 % (ref 11.6–15.4)
WBC: 5 x10E3/uL (ref 3.4–10.8)

## 2024-08-02 LAB — MAGNESIUM: Magnesium: 2.4 mg/dL — ABNORMAL HIGH (ref 1.6–2.3)

## 2024-08-03 ENCOUNTER — Encounter: Payer: Self-pay | Admitting: Internal Medicine

## 2024-08-03 DIAGNOSIS — I491 Atrial premature depolarization: Secondary | ICD-10-CM | POA: Insufficient documentation

## 2024-08-09 ENCOUNTER — Other Ambulatory Visit: Payer: Self-pay | Admitting: Internal Medicine

## 2024-08-13 ENCOUNTER — Inpatient Hospital Stay

## 2024-08-13 ENCOUNTER — Inpatient Hospital Stay (HOSPITAL_BASED_OUTPATIENT_CLINIC_OR_DEPARTMENT_OTHER): Admitting: Oncology

## 2024-08-13 ENCOUNTER — Inpatient Hospital Stay: Attending: Oncology

## 2024-08-13 ENCOUNTER — Encounter: Payer: Self-pay | Admitting: Oncology

## 2024-08-13 ENCOUNTER — Ambulatory Visit: Payer: Self-pay | Admitting: Oncology

## 2024-08-13 VITALS — BP 123/65 | HR 64 | Temp 95.5°F | Resp 18 | Ht 65.0 in | Wt 138.3 lb

## 2024-08-13 DIAGNOSIS — D508 Other iron deficiency anemias: Secondary | ICD-10-CM

## 2024-08-13 DIAGNOSIS — N184 Chronic kidney disease, stage 4 (severe): Secondary | ICD-10-CM | POA: Diagnosis not present

## 2024-08-13 DIAGNOSIS — D631 Anemia in chronic kidney disease: Secondary | ICD-10-CM | POA: Diagnosis not present

## 2024-08-13 DIAGNOSIS — I129 Hypertensive chronic kidney disease with stage 1 through stage 4 chronic kidney disease, or unspecified chronic kidney disease: Secondary | ICD-10-CM | POA: Insufficient documentation

## 2024-08-13 DIAGNOSIS — D649 Anemia, unspecified: Secondary | ICD-10-CM | POA: Diagnosis not present

## 2024-08-13 DIAGNOSIS — Z79899 Other long term (current) drug therapy: Secondary | ICD-10-CM

## 2024-08-13 LAB — IRON AND TIBC
Iron: 38 ug/dL — ABNORMAL LOW (ref 45–182)
Saturation Ratios: 15 % — ABNORMAL LOW (ref 17.9–39.5)
TIBC: 262 ug/dL (ref 250–450)
UIBC: 224 ug/dL

## 2024-08-13 LAB — CBC WITH DIFFERENTIAL (CANCER CENTER ONLY)
Abs Immature Granulocytes: 0.05 K/uL (ref 0.00–0.07)
Basophils Absolute: 0 K/uL (ref 0.0–0.1)
Basophils Relative: 0 %
Eosinophils Absolute: 0.1 K/uL (ref 0.0–0.5)
Eosinophils Relative: 2 %
HCT: 27.9 % — ABNORMAL LOW (ref 39.0–52.0)
Hemoglobin: 9.1 g/dL — ABNORMAL LOW (ref 13.0–17.0)
Immature Granulocytes: 1 %
Lymphocytes Relative: 18 %
Lymphs Abs: 1 K/uL (ref 0.7–4.0)
MCH: 31.9 pg (ref 26.0–34.0)
MCHC: 32.6 g/dL (ref 30.0–36.0)
MCV: 97.9 fL (ref 80.0–100.0)
Monocytes Absolute: 0.6 K/uL (ref 0.1–1.0)
Monocytes Relative: 9 %
Neutro Abs: 4.1 K/uL (ref 1.7–7.7)
Neutrophils Relative %: 70 %
Platelet Count: 163 K/uL (ref 150–400)
RBC: 2.85 MIL/uL — ABNORMAL LOW (ref 4.22–5.81)
RDW: 14.6 % (ref 11.5–15.5)
WBC Count: 5.9 K/uL (ref 4.0–10.5)
nRBC: 0 % (ref 0.0–0.2)

## 2024-08-13 LAB — FERRITIN: Ferritin: 95 ng/mL (ref 24–336)

## 2024-08-13 MED ORDER — DARBEPOETIN ALFA 40 MCG/0.4ML IJ SOSY
70.0000 ug | PREFILLED_SYRINGE | INTRAMUSCULAR | Status: DC
  Administered 2024-08-13: 70 ug via SUBCUTANEOUS
  Filled 2024-08-13: qty 0.7

## 2024-08-13 NOTE — Progress Notes (Signed)
 Hematology/Oncology Consult note [facility]  [phone] [phone]  Patient Care Team: Donal Channing SQUIBB, FNP as PCP - General (Family Medicine) End, Lonni, MD as PCP - Cardiology (Cardiology) Cindie Ole DASEN, MD as PCP - Electrophysiology (Cardiology) Melanee Annah BROCKS, MD as Consulting Physician (Oncology)   Name of the patient: Rodney Wiley  [id number]  1936-08-26   Date of visit: 08/13/24  Diagnosis-anemia of chronic kidney disease  Chief complaint/ Reason for visit-routine follow-up of anemia of chronic kidney disease  Heme/Onc history: patient is a 88 year old male with a past medical history significant for hypertension, hyperlipidemia, stage III CKD and history of aortic insufficiency as well as ascending aortic aneurysm.  He recently underwent bioprosthetic aortic valve replacement, CABG x3 and resuspension of the left atrium on 02/22/2018.    Results of blood work in June 2021 were consistent with iron  deficiency along with CKD. He received venofer  in July 2021.  He has not required any EPO injections so far   Patient's hemoglobin was stable between 10-11 up until August 2024.  He had 2 back-to-back hospitalizations for syncope, hyponatremia and nausea vomiting.  Following that his hemoglobin has been around 8.  Patient was started on Aranesp  and presently gets it every 2 weeks.    Interval history-patient has not come to get Aranesp  since June 2025.  Presently reports ongoing fatigue.  Blood pressure fluctuates up and down and he follows up with cardiology for the same.  ECOG PS- 2 Pain scale- 0   Review of systems- Review of Systems  Constitutional:  Positive for malaise/fatigue. Negative for chills, fever and weight loss.  HENT:  Negative for congestion, ear discharge and nosebleeds.   Eyes:  Negative for blurred vision.  Respiratory:  Negative for cough, hemoptysis, sputum production, shortness of breath and  wheezing.   Cardiovascular:  Negative for chest pain, palpitations, orthopnea and claudication.  Gastrointestinal:  Negative for abdominal pain, blood in stool, constipation, diarrhea, heartburn, melena, nausea and vomiting.  Genitourinary:  Negative for dysuria, flank pain, frequency, hematuria and urgency.  Musculoskeletal:  Negative for back pain, joint pain and myalgias.  Skin:  Negative for rash.  Neurological:  Negative for dizziness, tingling, focal weakness, seizures, weakness and headaches.  Endo/Heme/Allergies:  Does not bruise/bleed easily.  Psychiatric/Behavioral:  Negative for depression and suicidal ideas. The patient does not have insomnia.       No Known Allergies   Past Medical History:  Diagnosis Date   Anemia    Aortic insufficiency    a. 02/2018 s/p bioprosthetic AVR; 04/2018 Echo: EF 50-55%, Gr2 DD, Ao bioprosthesis, mean grad , Ao root/Asc Ao nl in size, mild MR, mildly dil LA, nl RV fxn. Nl PASP.   Arthropathy    Ascending aortic aneurysm (HCC)    a. 12/2016 MRA: 4.3cm @ sinus of valsalva, 4.9cm above sinotubular jxn; b. 02/2018 s/p biological Bentall and AVR.   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 02/2018 Cath: LAD 20ost, 40p, 6m, LCX 60p, OM2 60, OM3 85, RCA 10p/m w/ L->R and R->R collats;  b. 02/2018 CABG x 3: LIMA to LAD, SVG to OM2, SVG to PDA, EVH via right thigh.   CKD (chronic kidney disease), stage III (HCC)    Diverticulitis    Essential hypertension    GERD (gastroesophageal reflux disease)    Hemorrhoids    History of chicken pox    History of Helicobacter pylori  infection 06/2007   Hyperlipidemia    Hypertension    Left renal artery stenosis (HCC)    a. 01/2017 RA u/s: moderate L RAS.   Lower extremity edema    Nonrheumatic mitral (valve) insufficiency    a. 12/2016 Echo: mild to mod MR; b. 09/2017 TEE: mild to mod MR; c. 04/2018 Echo: Mild MR.   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 s/p 21 mm Edwards Inspiris Resilia stented bovine pericardial tissue valve and 24 mm Gelweave Valsalva synthetic root conduit with reimplantation of left main coronary artery.     Past Surgical History:  Procedure Laterality Date   AORTIC VALVE REPAIR N/A 02/22/2018   Procedure: AORTIC VALVE REPLACEMENT -Biological Bentall aortic root replacement,;  Surgeon: Dusty Sudie DEL, MD;  Location: [facility] OR;  Service: Open Heart Surgery;  Laterality: N/A;   bypass surgery     CARDIAC CATHETERIZATION     01/16/2018   COLONOSCOPY  2011   by Dr. Ora with findings of diverticulosis   CORONARY ARTERY BYPASS GRAFT N/A 02/22/2018   Procedure: CORONARY ARTERY BYPASS GRAFTING (CABG) x three, using left internal mammary artery and right leg greater saphenous vein harvested endoscopically;  Surgeon: Dusty Sudie DEL, MD;  Location: [facility] OR;  Service: Open Heart Surgery;  Laterality: N/A;   CORONARY/GRAFT ANGIOGRAPHY N/A 01/16/2018   Procedure: CORONARY/GRAFT ANGIOGRAPHY;  Surgeon: Mady Bruckner, MD;  Location: [facility] INVASIVE CV LAB;  Service: Cardiovascular;  Laterality: N/A;   ELBOW SURGERY Left    hemorhoidectomy     IR BONE MARROW BIOPSY & ASPIRATION  01/30/2024   IR CATHETER TUBE CHANGE  07/30/2024   IR CYSTOSTOMY TUBE PLACEMENT/BLADDER ASPIRATION  05/22/2024   RIGHT HEART CATH N/A 01/16/2018   Procedure: RIGHT HEART CATH;  Surgeon: Mady Bruckner, MD;  Location: [facility] INVASIVE CV LAB;  Service: Cardiovascular;  Laterality: N/A;   SHOULDER ARTHROSCOPY WITH ROTATOR CUFF REPAIR Right    SHOULDER SURGERY Right    TEE WITHOUT CARDIOVERSION N/A 09/27/2017   Procedure: TRANSESOPHAGEAL ECHOCARDIOGRAM (TEE);  Surgeon: Maranda Leim DEL, MD;  Location[facility] ENDOSCOPY;  Service: Cardiovascular;  Laterality: N/A;   TEE WITHOUT CARDIOVERSION N/A 02/22/2018   Procedure: TRANSESOPHAGEAL ECHOCARDIOGRAM (TEE);  Surgeon: Dusty Sudie DEL, MD;  Location: [facility] OR;  Service: Open Heart Surgery;  Laterality: N/A;   THORACIC AORTIC ANEURYSM REPAIR N/A 02/22/2018   Procedure: THORACIC ASCENDING ANEURYSM REPAIR (AAA) Resection of ascending aorta aneurysm;  Surgeon: Dusty Sudie DEL, MD;  Location: [facility] OR;  Service: Open Heart Surgery;  Laterality: N/A;    Social History   Socioeconomic History   Marital status: Widowed    Spouse name: Not on file   Number of children: Not on file   Years of education: Not on file   Highest education level: Not on file  Occupational History   Not on file  Tobacco Use   Smoking status: Never   Smokeless tobacco: Never  Vaping Use   Vaping status: Never Used  Substance and Sexual Activity   Alcohol  use: No   Drug use: No   Sexual activity: Not Currently  Other Topics Concern   Not on file  Social History Narrative   Not on file   Social Drivers of Health   Financial Resource Strain: Patient Declined (09/01/2023)   [facility] from [facility]   Overall Financial Resource Strain (CARDIA)    Difficulty of Paying  Living Expenses: Patient declined  Food Insecurity: No Food Insecurity (09/01/2023)   Received from [facility]   Hunger Vital Sign    Within the past 12 months, you worried that your food would run out before you got the money to buy more.: Never true    Within the past 12 months, the food you bought just didn't last and you didn't have money to get more.: Never true  Transportation Needs: No Transportation Needs (09/01/2023)   Received from [facility] - Transportation    Lack of Transportation (Medical): No    Lack of Transportation (Non-Medical): No  Physical Activity: Not on file  Stress: Not on file  Social Connections: Not on file  Intimate Partner Violence: Not At Risk (07/26/2023)   Humiliation, Afraid, Rape, and Kick questionnaire    Fear of Current or Ex-Partner: No    Emotionally Abused: No    Physically Abused: No    Sexually Abused: No     Family History  Problem Relation Age of Onset   Hypertension Mother    Stroke Mother    Leukemia Father    Heart attack Paternal Grandmother    Stroke Paternal Grandfather    Heart Problems Son 28   Heart Problems Son 39   Uterine cancer Sister    Prostate cancer Brother    Colon cancer Brother    Kidney failure Brother    Stroke Sister      Current Outpatient Medications:    amLODipine  (NORVASC ) 5 MG tablet, Take 5 mg by mouth daily. (Patient taking differently: Take 10 mg by mouth daily.), Disp: , Rfl:    aspirin  EC 81 MG tablet, Take 81 mg by mouth daily., Disp: , Rfl:    atorvastatin  (LIPITOR) 40 MG tablet, Take 1 tablet (40 mg total) by mouth daily., Disp: 90 tablet, Rfl: 3   cholecalciferol (VITAMIN D3) 25 MCG (1000 UT) tablet, Take 1,000 Units by mouth daily., Disp: , Rfl:    isosorbide  mononitrate (IMDUR ) 30 MG 24 hr tablet, Take 1 tablet (30 mg total) by mouth daily., Disp: 90 tablet, Rfl: 3   lisinopril  (ZESTRIL ) 5 MG tablet, TAKE 1 TABLET BY MOUTH DAILY, Disp: 90 tablet, Rfl: 3   oxyCODONE  (OXY IR/ROXICODONE ) 5 MG immediate release tablet, Take 5 mg by mouth every 6 (six) hours as needed., Disp: , Rfl:    pantoprazole  (PROTONIX ) 40 MG tablet, Take 1 tablet (40 mg total) by mouth daily., Disp: 30 tablet, Rfl: 1   tamsulosin  (FLOMAX ) 0.4 MG CAPS capsule, Take 1 capsule (0.4 mg total) by mouth daily., Disp: 30 capsule, Rfl: 11   torsemide  (DEMADEX ) 10 MG tablet, TAKE 1 TABLET BY MOUTH EVERY DAY AS NEEDED, Disp: 90 tablet, Rfl: 0   traMADol  (ULTRAM ) 50 MG tablet, Take 50 mg by mouth every 6 (six) hours as needed., Disp: , Rfl:  No current facility-administered medications for this visit.  Facility-Administered Medications Ordered in Other Visits:    Darbepoetin Alfa  (ARANESP ) injection 70 mcg, 70 mcg, Subcutaneous, Q14 Days, Melanee Annah BROCKS, MD, 70 mcg at 08/13/24 1228  Physical exam:  Vitals:   08/13/24 1149  BP: 123/65  Pulse: 64  Resp: 18  Temp: (!) 95.5 F  (35.3 C)  TempSrc: Tympanic  SpO2: 100%  Weight: 138 lb 4.8 oz (62.7 kg)  Height: 5' 5 (1.651 m)   Physical Exam Cardiovascular:     Rate and Rhythm: Normal rate and regular rhythm.     Heart  sounds: Normal heart sounds.  Pulmonary:     Effort: Pulmonary effort is normal.     Breath sounds: Normal breath sounds.  Abdominal:     General: Bowel sounds are normal.     Palpations: Abdomen is soft.  Musculoskeletal:     Comments: Trace lateral edema  Skin:    General: Skin is warm and dry.  Neurological:     Mental Status: He is alert and oriented to person, place, and time.      I have personally reviewed labs listed below:    Latest Ref Rng & Units 08/01/2024   12:04 PM  CMP  Glucose 70 - 99 mg/dL 84   BUN 8 - 27 mg/dL 47   Creatinine 9.23 - 1.27 mg/dL 7.78   Sodium 865 - 855 mmol/L 134   Potassium 3.5 - 5.2 mmol/L 4.7   Chloride 96 - 106 mmol/L 101   CO2 20 - 29 mmol/L 18   Calcium  8.6 - 10.2 mg/dL 9.3       Latest Ref Rng & Units 08/13/2024   11:20 AM  CBC  WBC 4.0 - 10.5 K/uL 5.9   Hemoglobin 13.0 - 17.0 g/dL 9.1   Hematocrit 60.9 - 52.0 % 27.9   Platelets 150 - 400 K/uL 163    I have personally reviewed Radiology images listed below: No images are attached to the encounter.  IR Catheter Tube Change Result Date: 07/30/2024 INDICATION: Routine exchange suprapubic catheter EXAM: Fluoroscopic guided suprapubic catheter exchange COMPLICATIONS: None immediate. PROCEDURE: Informed written consent was obtained from the patient after a thorough discussion of the procedural risks, benefits and alternatives. All questions were addressed. Maximal Sterile Barrier Technique was utilized including caps, mask, sterile gowns, sterile gloves, sterile drape, hand hygiene and skin antiseptic. A timeout was performed prior to the initiation of the procedure. With the patient in a supine position, the suprapubic region was prepped and draped in usual sterile fashion. A short Amplatz  wire was advanced into the suprapubic catheter and into the urinary bladder. The retention balloon was then aspirated and the catheter was removed. A new 16 [place] tip suprapubic catheter was then advanced over the guidewire into the urinary bladder. The retention balloon was then inflated with 10 mL of dilute contrast. The guidewire was removed. The bladder was injected through the suprapubic catheter demonstrating position. The catheter was then flushed and allowed to drain. IMPRESSION: Satisfactory exchange of a 16 [place] tip suprapubic catheter. Electronically Signed   By: Cordella Banner   On: 07/30/2024 14:55   ECHOCARDIOGRAM COMPLETE Result Date: 07/19/2024    ECHOCARDIOGRAM REPORT   Patient Name:   Rodney Wiley Date of Exam: 07/19/2024 Medical Rec #:  [id number]       Height:       67.0 in Accession #:    [id number]      Weight:       136.2 lb Date of Birth:  Dec 02, 1936        BSA:          1.718 m Patient Age:    88 years        BP:           140/62 mmHg Patient Gender: M               HR:           81 bpm. Exam Location:  [place] Procedure: 2D Echo, Cardiac Doppler, Color Doppler and Strain Analysis (Both  Spectral and Color Flow Doppler were utilized during procedure). Indications:    R60.0 Lower extremity edema  History:        Patient has prior history of Echocardiogram examinations, most                 recent 07/26/2023. CHF, CAD, Prior CABG, Prior Cardiac Surgery                 and Abnormal ECG, Aortic Valve Disease and AVR 21 mm Edwards                 Inspiris Resilia, Arrythmias:Bradycardia and Atrial                 Fibrillation, Signs/Symptoms:Syncope and Edema; Risk                 Factors:Hypertension and Dyslipidemia.  Sonographer:    Damien Drones Referring Phys: [id number] CADENCE H FURTH IMPRESSIONS  1. Left ventricular ejection fraction, by estimation, is 60 to 65%. The left ventricle has normal function. The left ventricle has no regional wall motion  abnormalities. There is moderate left ventricular hypertrophy. Left ventricular diastolic parameters are indeterminate. The average left ventricular global longitudinal strain is -13.5 %. The global longitudinal strain is abnormal.  2. Right ventricular systolic function is normal. The right ventricular size is normal.  3. Left atrial size was moderately dilated.  4. The mitral valve is normal in structure. Moderate mitral valve regurgitation. No evidence of mitral stenosis.  5. The aortic valve has been repaired/replaced, edwards Sapien bioprosthetic aortic valve 02/2018. Aortic valve regurgitation is not visualized. Aortic valve area, by VTI measures 0.78 cm. Aortic valve mean gradient measures 20.0 mmHg. Aortic valve Vmax  measures 3.12 m/s.  6. The inferior vena cava is normal in size with greater than 50% respiratory variability, suggesting right atrial pressure of 3 mmHg. FINDINGS  Left Ventricle: Left ventricular ejection fraction, by estimation, is 60 to 65%. The left ventricle has normal function. The left ventricle has no regional wall motion abnormalities. The average left ventricular global longitudinal strain is -13.5 %. Strain was performed and the global longitudinal strain is abnormal. The left ventricular internal cavity size was normal in size. There is moderate left ventricular hypertrophy. Left ventricular diastolic parameters are indeterminate. Right Ventricle: The right ventricular size is normal. No increase in right ventricular wall thickness. Right ventricular systolic function is normal. Left Atrium: Left atrial size was moderately dilated. Right Atrium: Right atrial size was normal in size. Pericardium: There is no evidence of pericardial effusion. Mitral Valve: The mitral valve is normal in structure. There is mild calcification of the mitral valve leaflet(s). Moderate mitral valve regurgitation. No evidence of mitral valve stenosis. MV peak gradient, 11.6 mmHg. The mean mitral valve  gradient is 5.5 mmHg. Tricuspid Valve: The tricuspid valve is normal in structure. Tricuspid valve regurgitation is mild . No evidence of tricuspid stenosis. Aortic Valve: The aortic valve has been repaired/replaced. Aortic valve regurgitation is not visualized. Aortic valve mean gradient measures 20.0 mmHg. Aortic valve peak gradient measures 38.9 mmHg. Aortic valve area, by VTI measures 0.78 cm. There is a  Edwards bioprosthetic valve present in the aortic position. Pulmonic Valve: The pulmonic valve was normal in structure. Pulmonic valve regurgitation is not visualized. No evidence of pulmonic stenosis. Aorta: The aortic root is normal in size and structure. Venous: The inferior vena cava is normal in size with greater than 50% respiratory variability, suggesting right atrial pressure of 3  mmHg. IAS/Shunts: No atrial level shunt detected by color flow Doppler. Additional Comments: 3D was performed not requiring image post processing on an independent workstation and was indeterminate.  LEFT VENTRICLE PLAX 2D LVIDd:         3.97 cm   Diastology LVIDs:         2.78 cm   LV e' medial:    14.10 cm/s LV PW:         1.88 cm   LV E/e' medial:  10.4 LV IVS:        1.40 cm   LV e' lateral:   23.20 cm/s LVOT diam:     1.60 cm   LV E/e' lateral: 6.3 LV SV:         46 LV SV Index:   27        2D Longitudinal Strain LVOT Area:     2.01 cm  2D Strain GLS Avg:     -13.5 %  RIGHT VENTRICLE             IVC RV Basal diam:  3.18 cm     IVC diam: 1.79 cm RV Mid diam:    2.69 cm RV S prime:     11.10 cm/s TAPSE (M-mode): 1.2 cm LEFT ATRIUM             Index        RIGHT ATRIUM           Index LA diam:        5.10 cm 2.97 cm/m   RA Area:     15.90 cm LA Vol (A2C):   91.4 ml 53.21 ml/m  RA Volume:   37.70 ml  21.95 ml/m LA Vol (A4C):   59.8 ml 34.82 ml/m LA Biplane Vol: 77.0 ml 44.83 ml/m  AORTIC VALVE                     PULMONIC VALVE AV Area (Vmax):    0.74 cm      PV Vmax:       1.14 m/s AV Area (Vmean):   0.77 cm       PV Peak grad:  5.2 mmHg AV Area (VTI):     0.78 cm AV Vmax:           312.00 cm/s AV Vmean:          210.000 cm/s AV VTI:            0.588 m AV Peak Grad:      38.9 mmHg AV Mean Grad:      20.0 mmHg LVOT Vmax:         115.00 cm/s LVOT Vmean:        80.600 cm/s LVOT VTI:          0.228 m LVOT/AV VTI ratio: 0.39  AORTA Ao Root diam: 3.20 cm Ao Asc diam:  2.90 cm MITRAL VALVE MV Area (PHT): 3.13 cm     SHUNTS MV Area VTI:   1.30 cm     Systemic VTI:  0.23 m MV Peak grad:  11.6 mmHg    Systemic Diam: 1.60 cm MV Mean grad:  5.5 mmHg MV Vmax:       1.70 m/s MV Vmean:      103.0 cm/s MV Decel Time: 242 msec MV E velocity: 146.00 cm/s MV A velocity: 49.30 cm/s MV E/A ratio:  2.96 Evalene Lunger MD Electronically signed by Evalene Lunger MD Signature Date/Time: 07/19/2024/6:37:42  PM    Final      Assessment and plan- Patient is a 88 y.o. male with history of anemia of chronic kidney disease here for routine follow-up  Patient's hemoglobin presently is around 9.  He has not received EPO since June 2025.  Will plan to restart Aranesp  every 2 weeks at this time.His iron  studies also came back with a ferritin of less than 100 and iron  saturation is 15%.  We will plan to give him IV iron  at this time as well.  Discussed risks and benefits of IV iron  including all but not limited to possible risk of infusion anaphylactic reaction.  Patient understands and agrees to proceed as planned.  H&H and Aranesp  every 2 weeks and I will see him back in 3 months with CBC ferritin and iron  studies   Visit Diagnosis 1. Anemia in stage 4 chronic kidney disease (HCC)   2. Erythropoietin (EPO) stimulating agent anemia management patient   3. Other iron  deficiency anemia      Dr. Annah Skene, MD, MPH [facility] at [facility] [phone] 08/13/2024 2:25 PM

## 2024-08-13 NOTE — Progress Notes (Signed)
 Patient doing okay, but experiencing some issues with sleep. He would like to know if we can give him anything to help him sleep at night. He is also having some left leg pain that he rates a 7 on a pain scale from 1-10.

## 2024-08-14 DIAGNOSIS — K922 Gastrointestinal hemorrhage, unspecified: Secondary | ICD-10-CM | POA: Insufficient documentation

## 2024-08-21 ENCOUNTER — Ambulatory Visit

## 2024-08-21 ENCOUNTER — Encounter: Payer: Self-pay | Admitting: Oncology

## 2024-08-21 NOTE — Telephone Encounter (Signed)
 Okay to come for we deferred today regardless of his PCP visit.  I believe he is also scheduled for  Aranesp  which he will  continue

## 2024-08-27 ENCOUNTER — Other Ambulatory Visit: Payer: Self-pay

## 2024-08-27 ENCOUNTER — Inpatient Hospital Stay

## 2024-08-27 ENCOUNTER — Other Ambulatory Visit: Payer: Self-pay | Admitting: Oncology

## 2024-08-27 VITALS — BP 122/59 | HR 44 | Temp 96.0°F | Resp 18

## 2024-08-27 DIAGNOSIS — N184 Chronic kidney disease, stage 4 (severe): Secondary | ICD-10-CM

## 2024-08-27 DIAGNOSIS — D508 Other iron deficiency anemias: Secondary | ICD-10-CM

## 2024-08-27 DIAGNOSIS — I129 Hypertensive chronic kidney disease with stage 1 through stage 4 chronic kidney disease, or unspecified chronic kidney disease: Secondary | ICD-10-CM | POA: Diagnosis not present

## 2024-08-27 DIAGNOSIS — D649 Anemia, unspecified: Secondary | ICD-10-CM

## 2024-08-27 LAB — PREPARE RBC (CROSSMATCH)

## 2024-08-27 LAB — HEMOGLOBIN AND HEMATOCRIT (CANCER CENTER ONLY)
HCT: 21.6 % — ABNORMAL LOW (ref 39.0–52.0)
Hemoglobin: 6.8 g/dL — CL (ref 13.0–17.0)

## 2024-08-27 MED ORDER — SODIUM CHLORIDE 0.9% FLUSH
10.0000 mL | Freq: Once | INTRAVENOUS | Status: AC | PRN
  Administered 2024-08-27: 10 mL
  Filled 2024-08-27: qty 10

## 2024-08-27 MED ORDER — IRON SUCROSE 20 MG/ML IV SOLN
200.0000 mg | INTRAVENOUS | Status: DC
  Administered 2024-08-27: 200 mg via INTRAVENOUS
  Filled 2024-08-27: qty 10

## 2024-08-28 ENCOUNTER — Inpatient Hospital Stay

## 2024-08-28 DIAGNOSIS — I129 Hypertensive chronic kidney disease with stage 1 through stage 4 chronic kidney disease, or unspecified chronic kidney disease: Secondary | ICD-10-CM | POA: Diagnosis not present

## 2024-08-28 DIAGNOSIS — D649 Anemia, unspecified: Secondary | ICD-10-CM

## 2024-08-28 MED ORDER — SODIUM CHLORIDE 0.9% IV SOLUTION
250.0000 mL | INTRAVENOUS | Status: DC
  Administered 2024-08-28: 100 mL via INTRAVENOUS
  Filled 2024-08-28: qty 250

## 2024-08-29 ENCOUNTER — Encounter: Payer: Self-pay | Admitting: Oncology

## 2024-08-30 LAB — TYPE AND SCREEN
ABO/RH(D): O NEG
Antibody Screen: NEGATIVE
Unit division: 0

## 2024-08-30 LAB — BPAM RBC
Blood Product Expiration Date: 202510252359
ISSUE DATE / TIME: 202509231017
Unit Type and Rh: 202510252359
Unit Type and Rh: 5100

## 2024-09-03 ENCOUNTER — Inpatient Hospital Stay

## 2024-09-03 VITALS — BP 136/77 | HR 52 | Temp 97.1°F | Resp 18

## 2024-09-03 DIAGNOSIS — D631 Anemia in chronic kidney disease: Secondary | ICD-10-CM

## 2024-09-03 DIAGNOSIS — D508 Other iron deficiency anemias: Secondary | ICD-10-CM

## 2024-09-03 DIAGNOSIS — I129 Hypertensive chronic kidney disease with stage 1 through stage 4 chronic kidney disease, or unspecified chronic kidney disease: Secondary | ICD-10-CM | POA: Diagnosis not present

## 2024-09-03 LAB — HEMOGLOBIN AND HEMATOCRIT (CANCER CENTER ONLY)
HCT: 28.1 % — ABNORMAL LOW (ref 39.0–52.0)
Hemoglobin: 8.9 g/dL — ABNORMAL LOW (ref 13.0–17.0)

## 2024-09-03 MED ORDER — IRON SUCROSE 20 MG/ML IV SOLN
200.0000 mg | INTRAVENOUS | Status: DC
  Administered 2024-09-03: 200 mg via INTRAVENOUS
  Filled 2024-09-03: qty 10

## 2024-09-03 NOTE — Patient Instructions (Signed)

## 2024-09-04 ENCOUNTER — Ambulatory Visit: Attending: Medical | Admitting: Medical

## 2024-09-04 ENCOUNTER — Encounter: Payer: Self-pay | Admitting: Medical

## 2024-09-04 VITALS — BP 130/66 | HR 76 | Ht 67.0 in | Wt 139.8 lb

## 2024-09-04 DIAGNOSIS — R001 Bradycardia, unspecified: Secondary | ICD-10-CM

## 2024-09-04 DIAGNOSIS — I5032 Chronic diastolic (congestive) heart failure: Secondary | ICD-10-CM | POA: Diagnosis not present

## 2024-09-04 DIAGNOSIS — Z952 Presence of prosthetic heart valve: Secondary | ICD-10-CM

## 2024-09-04 DIAGNOSIS — I440 Atrioventricular block, first degree: Secondary | ICD-10-CM

## 2024-09-04 DIAGNOSIS — I10 Essential (primary) hypertension: Secondary | ICD-10-CM | POA: Diagnosis not present

## 2024-09-04 DIAGNOSIS — I471 Supraventricular tachycardia, unspecified: Secondary | ICD-10-CM | POA: Diagnosis not present

## 2024-09-04 DIAGNOSIS — N189 Chronic kidney disease, unspecified: Secondary | ICD-10-CM

## 2024-09-04 DIAGNOSIS — I25118 Atherosclerotic heart disease of native coronary artery with other forms of angina pectoris: Secondary | ICD-10-CM | POA: Diagnosis not present

## 2024-09-04 DIAGNOSIS — D631 Anemia in chronic kidney disease: Secondary | ICD-10-CM

## 2024-09-04 DIAGNOSIS — N184 Chronic kidney disease, stage 4 (severe): Secondary | ICD-10-CM

## 2024-09-04 NOTE — Patient Instructions (Signed)
 Medication Instructions:  Your physician recommends that you continue on your current medications as directed. Please refer to the Current Medication list given to you today.    *If you need a refill on your cardiac medications before your next appointment, please call your pharmacy*  Lab Work: No labs ordered today    Testing/Procedures: No test ordered today   Follow-Up: At [facility], you and your health needs are our priority.  As part of our continuing mission to provide you with exceptional heart care, our providers are all part of one team.  This team includes your primary Cardiologist (physician) and Advanced Practice Providers or APPs (Physician Assistants and Nurse Practitioners) who all work together to provide you with the care you need, when you need it.  Your next appointment:   3 month(s)  Provider:   Lonni Hanson, MD or Cadence Franchester RIGGERS    Your physician recommends that you schedule a follow-up appointment with EP

## 2024-09-04 NOTE — Progress Notes (Signed)
 Cardiology Office Note   Date:  09/04/2024  ID:  Rodney Wiley, DOB Feb 08, 1936, MRN [id number] PCP: Donal Channing SQUIBB, FNP  [facility] Providers Cardiologist:  Lonni Hanson, MD Electrophysiologist:  OLE ONEIDA HOLTS, MD }    History of Present Illness Rodney Wiley is a 88 y.o. male  with history of CAD s/p CABG in 2009, thoracic aortic aneurysm and aortic regurgitation status post Bentall procedure with bio prosthetic aortic valve replacement at the time of CABG, chronic HFpEF, SVT, frequent PVCs, resistant hypertension, CKD stage IV with possible left renal artery stenosis presents for follow-up.   The patient underwent CABG and bioprosthetic AVR in 02/2018 with LIMA to LAD, SVG to OM2 and SVG to PDA. 24mm synthetic aortic root graft and 21mm Edwards Inspiris Roselia stented bovine pericardial tissue valve was used. EF shortly after was 50-55%, G2DD. Heart monitor 2020 showed NSR, PACs, frequent PVCs and runs of atrial tachycardia, PVC burden 8.4%. The patient was started on amiodarone . BB was stopped due to 1st degree AV block. Echo 02/2023 showed normal LVEF 55-60%, mild LVH, G1DD, normal mitral valve, aortic valve mean gradient 18.40mmHg.   The patient presented to the ER on 07/26/2023 with weakness, lightheadedness, elevated blood pressures, nausea and diaphoresis for the last few weeks.  High-sensitivity troponin elevated to 26.  EKG was nonacute.  Echo showed normal LV function, grade 2 diastolic dysfunction, moderate MR. Telemetry was unremarkable. Blood  Pressure medications were adjusted.   He was admitted at [facility] in September 2024 with hyponatremia secondary to SIADH, dyspnea, dysphagia with severe GERD (nausea and vomiting), normocytic anemia. He underwent extensive work-up to r/o underlying malignancy resulting in SIADGE. Barium swallow showed severe GERD and esophageal dysmotility, PPI was increased.    Patient was last seen 08/01/2024 for labile blood pressures.  Blood  pressure was 110/90.  He has been taking lisinopril  in the morning and Imdur  at night.  Lisinopril  was increased to 10 mg daily.  Patient was admitted to [facility] early September 2025 for lower GI bleed.  Hemoglobin trended 9.4>8.3.  Upper and lower endoscopy noted significant diverticular processes in the lower bowel as well as hemorrhoids.  Hemoglobin stabilized.  Today, the patient reports he is back at home and is doing OK. He denies further bleeding. He is back taking ASA. He denies chest pain. He has chronic SOB and lower leg edema that is unchanged. He takes torsemide  as needed (may be every other day or two days in a row)   Studies Reviewed EKG Interpretation Date/Time:  Tuesday September 04 2024 11:01:04 EDT Ventricular Rate:  55 PR Interval:  250 QRS Duration:  92 QT Interval:  442 QTC Calculation: 422 R Axis:   -60  Text Interpretation: Sinus bradycardia with sinus arrhythmia with 1st degree A-V block Left axis deviation Minimal voltage criteria for LVH, may be normal variant ( Cornell product ) Anteroseptal infarct (cited on or before 27-Oct-2023) When compared with ECG of 01-Aug-2024 11:55, Premature supraventricular complexes are no longer Present Confirmed by Franchester, Christiano Blandon ([id number]) on 09/04/2024 11:52:32 AM    Echo 07/2023  1. Left ventricular ejection fraction, by estimation, is 55 to 60%. The  left ventricle has normal function. The left ventricle has no regional  wall motion abnormalities. There is moderate left ventricular hypertrophy.  Left ventricular diastolic  parameters are consistent with Grade II diastolic dysfunction  (pseudonormalization).   2. Right ventricular systolic function is normal. The right ventricular  size is normal.   3.  Left atrial size was mildly dilated.   4. The mitral valve is normal in structure. Moderate mitral valve  regurgitation. No evidence of mitral stenosis.   5. The aortic valve is normal in structure. Aortic valve regurgitation is  not  visualized. Mild aortic valve stenosis. Aortic valve mean gradient  measures 16.4 mmHg. Aortic valve Vmax measures 2.71 m/s.   6. The inferior vena cava is normal in size with greater than 50%  respiratory variability, suggesting right atrial pressure of 3 mmHg.    Echo 02/2023 1. Left ventricular ejection fraction, by estimation, is 55 to 60%. The  left ventricle has normal function. The left ventricle demonstrates  regional wall motion abnormalities (septal wall hypokinesis possibly  secondary to post-operative state). There is   mild left ventricular hypertrophy. Left ventricular diastolic parameters  are consistent with Grade I diastolic dysfunction (impaired relaxation).  The average left ventricular global longitudinal strain is -12.6 %.   2. Right ventricular systolic function is normal. The right ventricular  size is normal. Tricuspid regurgitation signal is inadequate for assessing  PA pressure.   3. Left atrial size was moderately dilated.   4. The mitral valve is normal in structure. No evidence of mitral valve  regurgitation. No evidence of mitral stenosis.   5. The aortic valve has been repaired/replaced. Aortic valve  regurgitation is not visualized. No aortic stenosis is present. There is a  Edwards bioprosthetic valve present in the aortic position. Procedure  Date: 02/2018. Aortic valve mean gradient  measures 18.7 mmHg. Aortic valve Vmax measures 2.95 m/s.   6. The inferior vena cava is normal in size with greater than 50%  respiratory variability, suggesting right atrial pressure of 3 mmHg.    Echo 2022  1. Left ventricular ejection fraction, by estimation, is 60 to 65%. Left  ventricular ejection fraction by 3D volume is 69 %. The left ventricle has  normal function. The left ventricle has no regional wall motion  abnormalities. There is mild left  ventricular hypertrophy. Left ventricular diastolic parameters are  indeterminate.   2. Right ventricular systolic  function is low normal. The right  ventricular size is normal. There is normal pulmonary artery systolic  pressure.   3. The mitral valve is normal in structure. Mild to moderate mitral valve  regurgitation.   4. The aortic valve has been repaired/replaced. Aortic valve  regurgitation is not visualized. There is a Edwards bioprosthetic valve  present in the aortic position. Echo findings are consistent with normal  structure and function of the aortic valve  prosthesis. Aortic valve mean gradient measures 20.5 mmHg.   5. The inferior vena cava is normal in size with greater than 50%  respiratory variability, suggesting right atrial pressure of 3 mmHg.   Cardiac CTA 2019   IMPRESSION: 1. Trileaflet aortic valve with minimally thickened leaflets with no calcifications and central non-coaptation secondary to dilated sinuses with maximum diameter at the sinus level of 45 mm. Valvular annulus and LVOT are rather small.   2. 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 There is no dissection. There are no calcifications in the ascending aorta and moderate diffuse calcifications and atherosclerotic plaque in the aortic arch and descending thoracic aorta.   3. Normal coronary origin with right dominance.   4. Coronary calcium  score of 1074. This was 43 percentile for age and sex matched control.   5. Coronary arteries are suboptimally vasodilated.  However, there is moderate to severe diffuse plaque with suspicion for a three vessel disease. A cardiac catheterization is recommended prior to surgery.   Physical Exam VS:  BP 130/66 (BP Location: Left Arm, Patient Position: Sitting)   Pulse 76   Ht 5' 7 (1.702 m)   Wt 139 lb 12.8 oz (63.4 kg)   SpO2 98%   BMI 21.90 kg/m        Wt Readings from Last 3 Encounters:  09/04/24 139 lb 12.8 oz (63.4 kg)  08/13/24 138 lb 4.8 oz (62.7 kg)   08/01/24 138 lb 6.4 oz (62.8 kg)    GEN: Well nourished, well developed in no acute distress NECK: No JVD; No carotid bruits CARDIAC: bradycardia, RR, + murmur, no rubs, gallops RESPIRATORY:  Clear to auscultation without rales, wheezing or rhonchi  ABDOMEN: Soft, non-tender, non-distended EXTREMITIES:  mild lower leg edema; No deformity   ASSESSMENT AND PLAN  CAD The patient denies chest pain. He has chronic and unchanged SOB. Continue ASA, Imdur , and Lipitor.   HTN BP is normal today. Continue amlodipine  5mg  daily, lisinopril  5mg  daily, Imdur  30mg  daily.   HFpEF The patient has minimal lower leg edema. He takes Torsemide  10mg  as needed, which is usually every other day.   Sinus bradycardia pSVT First degree AV block Amiodarone  previously stopped for bradycardia with worsening PRI. EKG shows SB 55bpm with PRI . He was previously referred to EP, but not scheduled. We will refer him for pSVT and bradycardia with risk for worsening conduction disease. He is not on any rate controlling medications.   CKD stage IV Recent Scr 2.10, BUN 28.  Thoracic aortic aneurysm and AI s/p Bio-Bentall procedure He reports stable symptoms. Recent echo showed mean gradient of , no AI. Continue serial echocardiograms and ASA.   Anemia Recent hospitalization at [facility] for acute anemia s/p transfusion. Most recent Hgb 8.9.       Dispo: Follow-up in 3 months  Signed, Kiyoto Slomski VEAR Fishman, PA-C

## 2024-09-10 ENCOUNTER — Inpatient Hospital Stay

## 2024-09-12 ENCOUNTER — Encounter: Payer: Self-pay | Admitting: Oncology

## 2024-09-12 ENCOUNTER — Other Ambulatory Visit: Payer: Self-pay

## 2024-09-12 DIAGNOSIS — D508 Other iron deficiency anemias: Secondary | ICD-10-CM

## 2024-09-13 ENCOUNTER — Inpatient Hospital Stay

## 2024-09-13 ENCOUNTER — Inpatient Hospital Stay: Admitting: Nurse Practitioner

## 2024-09-13 ENCOUNTER — Encounter: Admitting: Hospice and Palliative Medicine

## 2024-09-13 ENCOUNTER — Inpatient Hospital Stay: Attending: Oncology

## 2024-09-13 VITALS — BP 149/74 | HR 44 | Temp 95.0°F | Resp 18

## 2024-09-13 DIAGNOSIS — N184 Chronic kidney disease, stage 4 (severe): Secondary | ICD-10-CM | POA: Insufficient documentation

## 2024-09-13 DIAGNOSIS — K922 Gastrointestinal hemorrhage, unspecified: Secondary | ICD-10-CM | POA: Diagnosis not present

## 2024-09-13 DIAGNOSIS — N390 Urinary tract infection, site not specified: Secondary | ICD-10-CM | POA: Insufficient documentation

## 2024-09-13 DIAGNOSIS — T83511A Infection and inflammatory reaction due to indwelling urethral catheter, initial encounter: Secondary | ICD-10-CM

## 2024-09-13 DIAGNOSIS — D500 Iron deficiency anemia secondary to blood loss (chronic): Secondary | ICD-10-CM

## 2024-09-13 DIAGNOSIS — D508 Other iron deficiency anemias: Secondary | ICD-10-CM

## 2024-09-13 DIAGNOSIS — T83518A Infection and inflammatory reaction due to other urinary catheter, initial encounter: Secondary | ICD-10-CM

## 2024-09-13 DIAGNOSIS — I129 Hypertensive chronic kidney disease with stage 1 through stage 4 chronic kidney disease, or unspecified chronic kidney disease: Secondary | ICD-10-CM | POA: Diagnosis present

## 2024-09-13 DIAGNOSIS — M67439 Ganglion, unspecified wrist: Secondary | ICD-10-CM | POA: Insufficient documentation

## 2024-09-13 DIAGNOSIS — R399 Unspecified symptoms and signs involving the genitourinary system: Secondary | ICD-10-CM

## 2024-09-13 DIAGNOSIS — R82998 Other abnormal findings in urine: Secondary | ICD-10-CM

## 2024-09-13 DIAGNOSIS — D631 Anemia in chronic kidney disease: Secondary | ICD-10-CM

## 2024-09-13 LAB — URINALYSIS, COMPLETE (UACMP) WITH MICROSCOPIC
Bilirubin Urine: NEGATIVE
Glucose, UA: NEGATIVE mg/dL
Hgb urine dipstick: NEGATIVE
Ketones, ur: NEGATIVE mg/dL
Nitrite: NEGATIVE
Protein, ur: 100 mg/dL — AB
Specific Gravity, Urine: 1.015 (ref 1.005–1.030)
WBC, UA: >50 WBC/hpf (ref 0–5)
pH: 8 (ref 5.0–8.0)

## 2024-09-13 LAB — CBC (CANCER CENTER ONLY)
HCT: 26 % — ABNORMAL LOW (ref 39.0–52.0)
Hemoglobin: 8.6 g/dL — ABNORMAL LOW (ref 13.0–17.0)
MCH: 31.5 pg (ref 26.0–34.0)
MCHC: 33.1 g/dL (ref 30.0–36.0)
MCV: 95.2 fL (ref 80.0–100.0)
Platelet Count: 166 K/uL (ref 150–400)
RBC: 2.73 MIL/uL — ABNORMAL LOW (ref 4.22–5.81)
RDW: 15 % (ref 11.5–15.5)
WBC Count: 4.5 K/uL (ref 4.0–10.5)
nRBC: 0 % (ref 0.0–0.2)

## 2024-09-13 LAB — CMP (CANCER CENTER ONLY)
ALT: 9 U/L (ref 0–44)
AST: 16 U/L (ref 15–41)
Albumin: 3.4 g/dL — ABNORMAL LOW (ref 3.5–5.0)
Alkaline Phosphatase: 55 U/L (ref 38–126)
Anion gap: 5 (ref 5–15)
BUN: 42 mg/dL — ABNORMAL HIGH (ref 8–23)
CO2: 21 mmol/L — ABNORMAL LOW (ref 22–32)
Calcium: 8.5 mg/dL — ABNORMAL LOW (ref 8.9–10.3)
Chloride: 106 mmol/L (ref 98–111)
Creatinine: 2.34 mg/dL — ABNORMAL HIGH (ref 0.61–1.24)
GFR, Estimated: 26 mL/min — ABNORMAL LOW (ref >60)
Glucose, Bld: 104 mg/dL — ABNORMAL HIGH (ref 70–99)
Potassium: 4.8 mmol/L (ref 3.5–5.1)
Sodium: 132 mmol/L — ABNORMAL LOW (ref 135–145)
Total Bilirubin: 0.8 mg/dL (ref 0.0–1.2)
Total Protein: 6.5 g/dL (ref 6.5–8.1)

## 2024-09-13 MED ORDER — SODIUM CHLORIDE 0.9% FLUSH
10.0000 mL | Freq: Once | INTRAVENOUS | Status: AC | PRN
  Administered 2024-09-13: 10 mL
  Filled 2024-09-13: qty 10

## 2024-09-13 MED ORDER — IRON SUCROSE 20 MG/ML IV SOLN
200.0000 mg | INTRAVENOUS | Status: DC
  Administered 2024-09-13: 200 mg via INTRAVENOUS
  Filled 2024-09-13: qty 10

## 2024-09-13 MED ORDER — CEFUROXIME AXETIL 250 MG PO TABS: 250.0000 mg | ORAL_TABLET | Freq: Two times a day (BID) | ORAL | 0 refills | Status: AC

## 2024-09-13 MED ORDER — SODIUM CHLORIDE 0.9 % IV SOLN
Freq: Once | INTRAVENOUS | Status: AC
  Filled 2024-09-13: qty 250

## 2024-09-13 NOTE — Progress Notes (Signed)
 1330: Patient here for venofer . His daughter in law stated he's had multiple issues going on. He has had back pain, bloody and cloudy urine, malodorous urine, and urgency to void despite having a chronic catheter. He is also having intermittent confusion per daughter-in-law. He has had feelings of wooziness and a very low bp yesterday down into the 70s systolic per his at home nurse. He also has had a decrease in his normal urine output at home. BP here today is 112/72 but his HR is lower than his normal from 46-52. Urine very dark and cloudy with drainage bag stained purple. [id number] Skene, MD notified of all the above. [facility] consulted. 1410: CMP drawn and sent. UA and urine culture obtained directly from suprapubic catheter, not from drainage bag for accuracy. UA and culture sent. 1518: 500 ml fluids started per order.  1600: Dasie, NP came to assess patient. Per Dasie, NP will reach out to urology to see patient as soon as their office can. Dasie, NP notified daughter in law of plan and if symptoms persist or worse, to go to the ER. Patient and daughter in law verified understanding.

## 2024-09-13 NOTE — Progress Notes (Signed)
 Symptom Management Clinic  [facility] at [facility] A Department of the [place]. [facility] [address] [place], [place] [address] [phone] (phone) [phone] (fax)  Patient Care Team: Donal Channing SQUIBB, FNP as PCP - General (Family Medicine) End, Lonni, MD as PCP - Cardiology (Cardiology) Cindie Ole DASEN, MD as PCP - Electrophysiology (Cardiology) Melanee Annah BROCKS, MD as Consulting Physician (Oncology)   Name of the patient: Rodney Wiley  [id number]  10/12/1936   Date of visit: 09/13/24  Diagnosis- Iron  Deficiency  Chief complaint/ Reason for visit- Weakness and abnormal urine  Heme/Onc History:  patient presented as a 88 year old male with a past medical history significant for hypertension, hyperlipidemia, stage III CKD and history of aortic insufficiency as well as ascending aortic aneurysm.  He recently underwent bioprosthetic aortic valve replacement, CABG x3 and resuspension of the left atrium on 02/22/2018.    Results of blood work in June 2021 were consistent with iron  deficiency along with CKD. He received venofer  in July 2021.  He has not required any EPO injections so far   Patient's hemoglobin was stable between 10-11 up until August 2024.  He had 2 back-to-back hospitalizations for syncope, hyponatremia and nausea vomiting.  Following that his hemoglobin has been around 8.  Patient was started on Aranesp  and presently gets it every 2 weeks.     Interval history-patient is an 88 year old male with above history of iron  deficiency who presents to clinic today for IV iron .  While receiving his infusion his daughter-in-law expressed concerns for increased weakness including 2 falls.  He has had a few episodes of low blood pressure at home.  She is concerned about his oral intake. Suprapubic catheter placed in June. Changed in August and has not been changed since that time. He has had hospitalization recently for GI  bleed with significant anemia and is receiving IV iron . Denies additional black bloody stool. Lives alone. Poor historian and daughter in law provides much of his history by phone.   Review of systems- Review of Systems  Constitutional:  Positive for malaise/fatigue. Negative for chills, fever and weight loss.  Respiratory:  Negative for shortness of breath.   Cardiovascular:  Negative for chest pain, palpitations and leg swelling.  Gastrointestinal:  Positive for abdominal pain (pelvic pain). Negative for blood in stool, constipation, diarrhea and melena.  Genitourinary:  Positive for hematuria and urgency. Negative for dysuria, flank pain and frequency.  Musculoskeletal:  Positive for falls. Negative for back pain, joint pain and myalgias.  Skin:  Negative for itching and rash.  Neurological:  Positive for weakness. Negative for dizziness, tingling, sensory change, loss of consciousness and headaches.  Endo/Heme/Allergies:  Negative for environmental allergies.  Psychiatric/Behavioral:  Positive for memory loss. Negative for depression. The patient is not nervous/anxious.     No Known Allergies  Past Medical History:  Diagnosis Date   Anemia    Aortic insufficiency    a. 02/2018 s/p bioprosthetic AVR; 04/2018 Echo: EF 50-55%, Gr2 DD, Ao bioprosthesis, mean grad , Ao root/Asc Ao nl in size, mild MR, mildly dil LA, nl RV fxn. Nl PASP.   Arthropathy    Ascending aortic aneurysm    a. 12/2016 MRA: 4.3cm @ sinus of valsalva, 4.9cm above sinotubular jxn; b. 02/2018 s/p biological Bentall and AVR.   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 02/2018 Cath: LAD 20ost, 40p, 67m, LCX 60p, OM2 60, OM3  85, RCA 10p/m w/ L->R and R->R collats;  b. 02/2018 CABG x 3: LIMA to LAD, SVG to OM2, SVG to PDA, EVH via right thigh.   CKD (chronic kidney disease), stage III (HCC)    Diverticulitis    Essential hypertension    GERD (gastroesophageal reflux disease)    Hemorrhoids     History of chicken pox    History of Helicobacter pylori infection 06/2007   Hyperlipidemia    Hypertension    Left renal artery stenosis    a. 01/2017 RA u/s: moderate L RAS.   Lower extremity edema    Nonrheumatic mitral (valve) insufficiency    a. 12/2016 Echo: mild to mod MR; b. 09/2017 TEE: mild to mod MR; c. 04/2018 Echo: Mild MR.   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 s/p 21 mm Edwards Inspiris Resilia stented bovine pericardial tissue valve and 24 mm Gelweave Valsalva synthetic root conduit with reimplantation of left main coronary artery.    Past Surgical History:  Procedure Laterality Date   AORTIC VALVE REPAIR N/A 02/22/2018   Procedure: AORTIC VALVE REPLACEMENT -Biological Bentall aortic root replacement,;  Surgeon: Dusty Sudie DEL, MD;  Location: [facility] OR;  Service: Open Heart Surgery;  Laterality: N/A;   bypass surgery     CARDIAC CATHETERIZATION     01/16/2018   COLONOSCOPY  2011   by Dr. Ora with findings of diverticulosis   CORONARY ARTERY BYPASS GRAFT N/A 02/22/2018   Procedure: CORONARY ARTERY BYPASS GRAFTING (CABG) x three, using left internal mammary artery and right leg greater saphenous vein harvested endoscopically;  Surgeon: Dusty Sudie DEL, MD;  Location: [facility] OR;  Service: Open Heart Surgery;  Laterality: N/A;   CORONARY/GRAFT ANGIOGRAPHY N/A 01/16/2018   Procedure: CORONARY/GRAFT ANGIOGRAPHY;  Surgeon: Mady Bruckner, MD;  Location: [facility] INVASIVE CV LAB;  Service: Cardiovascular;  Laterality: N/A;   ELBOW SURGERY Left    hemorhoidectomy     IR BONE MARROW BIOPSY & ASPIRATION  01/30/2024   IR CATHETER TUBE CHANGE  07/30/2024   IR CYSTOSTOMY TUBE PLACEMENT/BLADDER ASPIRATION  05/22/2024   RIGHT HEART CATH N/A 01/16/2018   Procedure: RIGHT HEART CATH;  Surgeon: Mady Bruckner, MD;  Location: [facility] INVASIVE CV LAB;  Service: Cardiovascular;   Laterality: N/A;   SHOULDER ARTHROSCOPY WITH ROTATOR CUFF REPAIR Right    SHOULDER SURGERY Right    TEE WITHOUT CARDIOVERSION N/A 09/27/2017   Procedure: TRANSESOPHAGEAL ECHOCARDIOGRAM (TEE);  Surgeon: Maranda Leim DEL, MD;  Location: [facility] ENDOSCOPY;  Service: Cardiovascular;  Laterality: N/A;   TEE WITHOUT CARDIOVERSION N/A 02/22/2018   Procedure: TRANSESOPHAGEAL ECHOCARDIOGRAM (TEE);  Surgeon: Dusty Sudie DEL, MD;  Location: [facility] OR;  Service: Open Heart Surgery;  Laterality: N/A;   THORACIC AORTIC ANEURYSM REPAIR N/A 02/22/2018   Procedure: THORACIC ASCENDING ANEURYSM REPAIR (AAA) Resection of ascending aorta aneurysm;  Surgeon: Dusty Sudie DEL, MD;  Location: [facility] OR;  Service: Open Heart Surgery;  Laterality: N/A;    Social History   Socioeconomic History   Marital status: Widowed    Spouse name: Not on file   Number of children: Not on file   Years of education: Not on file   Highest education level: Not on file  Occupational History   Not on file  Tobacco Use   Smoking status: Never   Smokeless tobacco: Never  Vaping Use   Vaping status: Never Used  Substance and Sexual Activity  Alcohol  use: No   Drug use: No   Sexual activity: Not Currently  Other Topics Concern   Not on file  Social History Narrative   Not on file   Social Drivers of Health   Financial Resource Strain: Low Risk  (08/15/2024)   Received from [facility]   Overall Financial Resource Strain (CARDIA)    How hard is it for you to pay for the very basics like food, housing, medical care, and heating?: Not very hard  Food Insecurity: No Food Insecurity (08/15/2024)   Received from [facility]   Hunger Vital Sign    Within the past 12 months, you worried that your food would run out before you got the money to buy more.: Never true    Within the past 12 months, the food you bought just didn't last and you didn't have money to get more.: Never true  Transportation Needs: No Transportation Needs  (08/15/2024)   Received from [facility] - Transportation    Lack of Transportation (Medical): No    Lack of Transportation (Non-Medical): No  Physical Activity: Not on file  Stress: Not on file  Social Connections: Not on file  Intimate Partner Violence: Not At Risk (07/26/2023)   Humiliation, Afraid, Rape, and Kick questionnaire    Fear of Current or Ex-Partner: No    Emotionally Abused: No    Physically Abused: No    Sexually Abused: No    Family History  Problem Relation Age of Onset   Hypertension Mother    Stroke Mother    Leukemia Father    Heart attack Paternal Grandmother    Stroke Paternal Grandfather    Heart Problems Son 18   Heart Problems Son 38   Uterine cancer Sister    Prostate cancer Brother    Colon cancer Brother    Kidney failure Brother    Stroke Sister      Current Outpatient Medications:    amLODipine  (NORVASC ) 5 MG tablet, Take 5 mg by mouth daily. (Patient taking differently: Take 10 mg by mouth daily.), Disp: , Rfl:    aspirin  EC 81 MG tablet, Take 81 mg by mouth daily., Disp: , Rfl:    atorvastatin  (LIPITOR) 40 MG tablet, Take 1 tablet (40 mg total) by mouth daily., Disp: 90 tablet, Rfl: 3   cholecalciferol (VITAMIN D3) 25 MCG (1000 UT) tablet, Take 1,000 Units by mouth daily., Disp: , Rfl:    isosorbide  mononitrate (IMDUR ) 30 MG 24 hr tablet, Take 1 tablet (30 mg total) by mouth daily., Disp: 90 tablet, Rfl: 3   lisinopril  (ZESTRIL ) 5 MG tablet, TAKE 1 TABLET BY MOUTH DAILY, Disp: 90 tablet, Rfl: 3   oxyCODONE  (OXY IR/ROXICODONE ) 5 MG immediate release tablet, Take 5 mg by mouth every 6 (six) hours as needed., Disp: , Rfl:    pantoprazole  (PROTONIX ) 40 MG tablet, Take 1 tablet (40 mg total) by mouth daily., Disp: 30 tablet, Rfl: 1   tamsulosin  (FLOMAX ) 0.4 MG CAPS capsule, Take 1 capsule (0.4 mg total) by mouth daily., Disp: 30 capsule, Rfl: 11   torsemide  (DEMADEX ) 10 MG tablet, TAKE 1 TABLET BY MOUTH EVERY DAY AS NEEDED, Disp: 90  tablet, Rfl: 0   traMADol  (ULTRAM ) 50 MG tablet, Take 50 mg by mouth every 6 (six) hours as needed., Disp: , Rfl:  No current facility-administered medications for this visit.  Facility-Administered Medications Ordered in Other Visits:    0.9 %  sodium chloride   infusion, , Intravenous, Once, Dasie Tinnie MATSU, NP, Last Rate: 300 mL/hr at 09/13/24 1518, New Bag at 09/13/24 1518   iron  sucrose (VENOFER ) injection 200 mg, 200 mg, Intravenous, Weekly, Melanee Annah BROCKS, MD, 200 mg at 09/13/24 1350  Physical exam: There were no vitals filed for this visit. Physical Exam Vitals reviewed.  Constitutional:      Comments: Elderly male. In infusion suite in recliner. Unaccompanied.   Abdominal:     General: There is no distension.     Tenderness: There is no abdominal tenderness.  Genitourinary:    Comments: Suprapubic catheter with leg bag. Purple/indigo urine urine with clumps present.  Skin:    General: Skin is warm.     Coloration: Skin is not jaundiced or pale.  Neurological:     Mental Status: He is alert and oriented to person, place, and time. Mental status is at baseline.  Psychiatric:        Mood and Affect: Mood normal.        Behavior: Behavior is cooperative.        Cognition and Memory: He exhibits impaired recent memory.         Latest Ref Rng & Units 09/13/2024    2:14 PM  CMP  Glucose 70 - 99 mg/dL 895   BUN 8 - 23 mg/dL 42   Creatinine 9.38 - 1.24 mg/dL 7.65   Sodium 864 - 854 mmol/L 132   Potassium 3.5 - 5.1 mmol/L 4.8   Chloride 98 - 111 mmol/L 106   CO2 22 - 32 mmol/L 21   Calcium  8.9 - 10.3 mg/dL 8.5   Total Protein 6.5 - 8.1 g/dL 6.5   Total Bilirubin 0.0 - 1.2 mg/dL 0.8   Alkaline Phos 38 - 126 U/L 55   AST 15 - 41 U/L 16   ALT 0 - 44 U/L 9       Latest Ref Rng & Units 09/13/2024    1:16 PM  CBC  WBC 4.0 - 10.5 K/uL 4.5   Hemoglobin 13.0 - 17.0 g/dL 8.6   Hematocrit 60.9 - 52.0 % 26.0   Platelets 150 - 400 K/uL 166    Component Ref Range & Units  (hover) 14:37 (09/13/24)  Color, Urine AMBER Abnormal   Comment: BIOCHEMICALS MAY BE AFFECTED BY COLOR  APPearance CLOUDY Abnormal   Specific Gravity, Urine 1.015  pH 8.0  Glucose, UA NEGATIVE  Hgb urine dipstick NEGATIVE  Bilirubin Urine NEGATIVE  Ketones, ur NEGATIVE  Protein, ur 100 Abnormal   Nitrite NEGATIVE  Leukocytes,Ua MODERATE Abnormal   RBC / HPF 0-5  WBC, UA >50  Bacteria, UA MANY Abnormal   Squamous Epithelial / HPF 0-5  WBC Clumps PRESENT  Mucus PRESENT  Amorphous Crystal PRESENT  Triple Phosphate Crystal PRESENT   No results found.  Assessment and plan- Patient is a 88 y.o. male currently in clinic for iron  deficiency anemia secondary to GI bleed and asked to be evaluated for:   UTI-urinalysis and microscopy reviewed today.  Leukocytes moderate with greater than 50 WBCs and clumps present to her strong indicators of UTI versus inflammation.  Many bacteria confirming infection.  No RBCs ruling out hematuria.  No squamous epithelial.  Mucus present which may be associated with inflammation.  Likely complicated by alkaline urine and presence of triple phosphate crystals.  Culture is pending but I reached out to urology/Shannon, PA who recommends empirically covering with Ceftin  given symptoms, renally dosed at 250 mg twice a day for  7 days. Possible purple bag syndrome-based on urine color, pH, and bacterial load in catheterized patient.  Can be a benign finding.  Plan to treat the urinary tract infection and have him follow-up with urology next week.  He is due for catheter replacement.  Last in August.  Poor intake-specific gravity of the urine is normal but creatinine and BUN are elevated as compared to baseline.  Okay to proceed with 500 cc of IV fluids in clinic today.  Dose reduced due to history of CKD.  I updated his daughter-in-law.  Advised that if he has worsening symptoms he will need evaluation in the emergency room for possible hospitalization.  He will  follow-up at the cancer center as scheduled for ongoing management of his iron  deficiency.   Visit Diagnosis 1. Urinary tract infection due to indwelling urinary catheter, initial encounter   2. Purple urine bag syndrome     Patient expressed understanding and was in agreement with this plan. He also understands that He can call clinic at any time with any questions, concerns, or complaints.   Thank you for allowing me to participate in the care of this very pleasant patient.   Tinnie Dawn, DNP, AGNP-C, AOCNP [facility] at [facility] [phone]

## 2024-09-14 LAB — URINE CULTURE

## 2024-09-17 ENCOUNTER — Ambulatory Visit

## 2024-09-17 NOTE — Progress Notes (Deleted)
 09/18/2024 7:17 PM   Rodney Wiley 1936-07-20 [id number]  Referring provider: Donal Channing SQUIBB, FNP [address] [place],  [place] [address]  Urological history: 1. Elevated PSA -PSA (01/2023) 27.4 -prostate MRI (03/2023) negative   2. BPH with LU TS -prostate volume (MRI 2024) - 50 cc  -cysto (02/2024) - prominent lateral lobe enlargement, mild elevation of bladder neck  3. Renal cysts  -contrast CT (08/2024) multiple bilateral renal cysts   No chief complaint on file.  HPI: Rodney Wiley is a 88 y.o. man who presents today for purple/dark urine ***daughter, [name].  Previous records reviewed.   He was being seen at the cancer center for IV iron  infusions after having a GI bleed.  It was noticed that he had purple looking urine and the last time it was exchanged was August.  UA at the cancer center was amber cloudy, specific gravity 1.015, pH 8.0, 100 protein, moderate leuks, 0-5 RBC's, > 50 WBC's, many bacteria, WBC clumps and mucus present.  Urine culture was positive for multiple species.  He was started on Ceftin  250 mg BID x 7 days.           PMH: Past Medical History:  Diagnosis Date   Anemia    Aortic insufficiency    a. 02/2018 s/p bioprosthetic AVR; 04/2018 Echo: EF 50-55%, Gr2 DD, Ao bioprosthesis, mean grad , Ao root/Asc Ao nl in size, mild MR, mildly dil LA, nl RV fxn. Nl PASP.   Arthropathy    Ascending aortic aneurysm    a. 12/2016 MRA: 4.3cm @ sinus of valsalva, 4.9cm above sinotubular jxn; b. 02/2018 s/p biological Bentall and AVR.   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 02/2018 Cath: LAD 20ost, 40p, 11m, LCX 60p, OM2 60, OM3 85, RCA 10p/m w/ L->R and R->R collats;  b. 02/2018 CABG x 3: LIMA to LAD, SVG to OM2, SVG to PDA, EVH via right thigh.   CKD (chronic kidney disease), stage III (HCC)    Diverticulitis    Essential hypertension    GERD (gastroesophageal reflux disease)    Hemorrhoids    History of  chicken pox    History of Helicobacter pylori infection 06/2007   Hyperlipidemia    Hypertension    Left renal artery stenosis    a. 01/2017 RA u/s: moderate L RAS.   Lower extremity edema    Nonrheumatic mitral (valve) insufficiency    a. 12/2016 Echo: mild to mod MR; b. 09/2017 TEE: mild to mod MR; c. 04/2018 Echo: Mild MR.   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 s/p 21 mm Edwards Inspiris Resilia stented bovine pericardial tissue valve and 24 mm Gelweave Valsalva synthetic root conduit with reimplantation of left main coronary artery.    Surgical History: Past Surgical History:  Procedure Laterality Date   AORTIC VALVE REPAIR N/A 02/22/2018   Procedure: AORTIC VALVE REPLACEMENT -Biological Bentall aortic root replacement,;  Surgeon: Dusty Sudie DEL, MD;  Location: [facility] OR;  Service: Open Heart Surgery;  Laterality: N/A;   bypass surgery     CARDIAC CATHETERIZATION     01/16/2018   COLONOSCOPY  2011   by Dr. Ora with findings of diverticulosis   CORONARY ARTERY BYPASS GRAFT N/A 02/22/2018   Procedure: CORONARY ARTERY BYPASS GRAFTING (CABG) x three, using left internal mammary artery and right  leg greater saphenous vein harvested endoscopically;  Surgeon: Dusty Sudie DEL, MD;  Location: [facility] OR;  Service: Open Heart Surgery;  Laterality: N/A;   CORONARY/GRAFT ANGIOGRAPHY N/A 01/16/2018   Procedure: CORONARY/GRAFT ANGIOGRAPHY;  Surgeon: Mady Bruckner, MD;  Location: [facility] INVASIVE CV LAB;  Service: Cardiovascular;  Laterality: N/A;   ELBOW SURGERY Left    hemorhoidectomy     IR BONE MARROW BIOPSY & ASPIRATION  01/30/2024   IR CATHETER TUBE CHANGE  07/30/2024   IR CYSTOSTOMY TUBE PLACEMENT/BLADDER ASPIRATION  05/22/2024   RIGHT HEART CATH N/A 01/16/2018   Procedure: RIGHT HEART CATH;  Surgeon: Mady Bruckner, MD;  Location: [facility] INVASIVE CV LAB;  Service: Cardiovascular;   Laterality: N/A;   SHOULDER ARTHROSCOPY WITH ROTATOR CUFF REPAIR Right    SHOULDER SURGERY Right    TEE WITHOUT CARDIOVERSION N/A 09/27/2017   Procedure: TRANSESOPHAGEAL ECHOCARDIOGRAM (TEE);  Surgeon: Maranda Leim DEL, MD;  Location: [facility] ENDOSCOPY;  Service: Cardiovascular;  Laterality: N/A;   TEE WITHOUT CARDIOVERSION N/A 02/22/2018   Procedure: TRANSESOPHAGEAL ECHOCARDIOGRAM (TEE);  Surgeon: Dusty Sudie DEL, MD;  Location: [facility] OR;  Service: Open Heart Surgery;  Laterality: N/A;   THORACIC AORTIC ANEURYSM REPAIR N/A 02/22/2018   Procedure: THORACIC ASCENDING ANEURYSM REPAIR (AAA) Resection of ascending aorta aneurysm;  Surgeon: Dusty Sudie DEL, MD;  Location: [facility] OR;  Service: Open Heart Surgery;  Laterality: N/A;    Home Medications:  Allergies as of 09/18/2024   No Known Allergies      Medication List        Accurate as of September 17, 2024  7:17 PM. If you have any questions, ask your nurse or doctor.          amLODipine  5 MG tablet Commonly known as: NORVASC  Take 5 mg by mouth daily. What changed: how much to take   aspirin  EC 81 MG tablet Take 81 mg by mouth daily.   atorvastatin  40 MG tablet Commonly known as: LIPITOR Take 1 tablet (40 mg total) by mouth daily.   cefUROXime  250 MG tablet Commonly known as: CEFTIN  Take 1 tablet (250 mg total) by mouth 2 (two) times daily with a meal for 7 days.   cholecalciferol 25 MCG (1000 UNIT) tablet Commonly known as: VITAMIN D3 Take 1,000 Units by mouth daily.   isosorbide  mononitrate 30 MG 24 hr tablet Commonly known as: IMDUR  Take 1 tablet (30 mg total) by mouth daily.   lisinopril  5 MG tablet Commonly known as: ZESTRIL  TAKE 1 TABLET BY MOUTH DAILY   oxyCODONE  5 MG immediate release tablet Commonly known as: Oxy IR/ROXICODONE  Take 5 mg by mouth every 6 (six) hours as needed.   pantoprazole  40 MG tablet Commonly known as: Protonix  Take 1 tablet (40 mg total) by mouth daily.   tamsulosin  0.4 MG Caps  capsule Commonly known as: FLOMAX  Take 1 capsule (0.4 mg total) by mouth daily.   torsemide  10 MG tablet Commonly known as: DEMADEX  TAKE 1 TABLET BY MOUTH EVERY DAY AS NEEDED   traMADol  50 MG tablet Commonly known as: ULTRAM  Take 50 mg by mouth every 6 (six) hours as needed.        Allergies: No Known Allergies  Family History: Family History  Problem Relation Age of Onset   Hypertension Mother    Stroke Mother    Leukemia Father    Heart attack Paternal Grandmother    Stroke Paternal Grandfather    Heart Problems Son 25   Heart Problems Son 69   Uterine cancer  Sister    Prostate cancer Brother    Colon cancer Brother    Kidney failure Brother    Stroke Sister     Social History:  reports that he has never smoked. He has never used smokeless tobacco. He reports that he does not drink alcohol  and does not use drugs.  ROS: Pertinent ROS in HPI  Physical Exam:  There were no vitals taken for this visit.  Constitutional:  Well nourished. Alert and oriented, No acute distress. HEENT: [place] AT, moist mucus membranes.  Trachea midline, no masses. Cardiovascular: No clubbing, cyanosis, or edema. Respiratory: Normal respiratory effort, no increased work of breathing. GI: Abdomen is soft, non tender, non distended, no abdominal masses. Liver and spleen not palpable.  No hernias appreciated.  Stool sample for occult testing is not indicated.   GU: No CVA tenderness.  No bladder fullness or masses.  Patient with circumcised/uncircumcised phallus. ***Foreskin easily retracted***  Urethral meatus is patent.  No penile discharge. No penile lesions or rashes. Scrotum without lesions, cysts, rashes and/or edema.  Testicles are located scrotally bilaterally. No masses are appreciated in the testicles. Left and right epididymis are normal. Rectal: Patient with  normal sphincter tone. Anus and perineum without scarring or rashes. No rectal masses are appreciated. Prostate is approximately  *** grams, *** nodules are appreciated. Seminal vesicles are normal. Skin: No rashes, bruises or suspicious lesions. Lymph: No cervical or inguinal adenopathy. Neurologic: Grossly intact, no focal deficits, moving all 4 extremities. Psychiatric: Normal mood and affect.   Laboratory Data: See Epic and HPI   I have reviewed the labs.  See HPI.     Pertinent Imaging: N/A  Suprapubic Cath Change Patient is present today for a suprapubic catheter change due to urinary retention.  ***ml of water was drained from the balloon, a ***FR foley cath was removed from the tract with out difficulty.  Site was cleaned and prepped in a sterile fashion with betadine.  A ***FR foley cath was replaced into the tract {dnt complications:[id number]}. Urine return was noted, 10 ml of sterile water was inflated into the balloon and a *** bag was attached for drainage.  Patient tolerated well. A night bag was given to patient and proper instruction was given on how to switch bags.    Performed by: ***  Follow up: No follow-ups on file.     1. Urinary retention -currently managed with SPT changed monthly  -changed today  2. BPH with LU TS - SPT in place   3. Elevated PSA  - prostate MRI (2024) negative   4. Purple bag syndrome  - explained that purple urine bag syndrome (PUBS) is thought to be caused by the interaction between the interaction between some bacteria and urine products causing the violet hue and reassured the patient and their family that this is not a worrisome finding  No follow-ups on file.  These notes generated with voice recognition software. I apologize for typographical errors.  CLOTILDA HELON RIGGERS  [facility] [address]  [address] [place], [place] [address] [phone]

## 2024-09-18 ENCOUNTER — Inpatient Hospital Stay
Admission: EM | Admit: 2024-09-18 | Discharge: 2024-09-20 | DRG: 312 | Disposition: A | Discharge status: Home / Self Care | Source: Ambulatory Visit | Attending: Student | Admitting: Student

## 2024-09-18 ENCOUNTER — Other Ambulatory Visit: Payer: Self-pay

## 2024-09-18 ENCOUNTER — Emergency Department

## 2024-09-18 ENCOUNTER — Ambulatory Visit: Admitting: Urology

## 2024-09-18 ENCOUNTER — Encounter: Payer: Self-pay | Admitting: Emergency Medicine

## 2024-09-18 DIAGNOSIS — I25118 Atherosclerotic heart disease of native coronary artery with other forms of angina pectoris: Secondary | ICD-10-CM | POA: Diagnosis present

## 2024-09-18 DIAGNOSIS — D509 Iron deficiency anemia, unspecified: Secondary | ICD-10-CM | POA: Diagnosis present

## 2024-09-18 DIAGNOSIS — I130 Hypertensive heart and chronic kidney disease with heart failure and stage 1 through stage 4 chronic kidney disease, or unspecified chronic kidney disease: Secondary | ICD-10-CM | POA: Diagnosis present

## 2024-09-18 DIAGNOSIS — N184 Chronic kidney disease, stage 4 (severe): Secondary | ICD-10-CM | POA: Diagnosis present

## 2024-09-18 DIAGNOSIS — T83511A Infection and inflammatory reaction due to indwelling urethral catheter, initial encounter: Principal | ICD-10-CM | POA: Diagnosis present

## 2024-09-18 DIAGNOSIS — E785 Hyperlipidemia, unspecified: Secondary | ICD-10-CM | POA: Diagnosis present

## 2024-09-18 DIAGNOSIS — N401 Enlarged prostate with lower urinary tract symptoms: Secondary | ICD-10-CM

## 2024-09-18 DIAGNOSIS — D631 Anemia in chronic kidney disease: Secondary | ICD-10-CM | POA: Diagnosis present

## 2024-09-18 DIAGNOSIS — I340 Nonrheumatic mitral (valve) insufficiency: Secondary | ICD-10-CM | POA: Diagnosis present

## 2024-09-18 DIAGNOSIS — N400 Enlarged prostate without lower urinary tract symptoms: Secondary | ICD-10-CM | POA: Diagnosis present

## 2024-09-18 DIAGNOSIS — I493 Ventricular premature depolarization: Secondary | ICD-10-CM | POA: Diagnosis present

## 2024-09-18 DIAGNOSIS — E222 Syndrome of inappropriate secretion of antidiuretic hormone: Secondary | ICD-10-CM | POA: Diagnosis present

## 2024-09-18 DIAGNOSIS — R001 Bradycardia, unspecified: Secondary | ICD-10-CM | POA: Diagnosis present

## 2024-09-18 DIAGNOSIS — R55 Syncope and collapse: Principal | ICD-10-CM | POA: Diagnosis present

## 2024-09-18 DIAGNOSIS — Z8249 Family history of ischemic heart disease and other diseases of the circulatory system: Secondary | ICD-10-CM

## 2024-09-18 DIAGNOSIS — Z79899 Other long term (current) drug therapy: Secondary | ICD-10-CM

## 2024-09-18 DIAGNOSIS — Z7982 Long term (current) use of aspirin: Secondary | ICD-10-CM

## 2024-09-18 DIAGNOSIS — Z87440 Personal history of urinary (tract) infections: Secondary | ICD-10-CM

## 2024-09-18 DIAGNOSIS — Y846 Urinary catheterization as the cause of abnormal reaction of the patient, or of later complication, without mention of misadventure at the time of the procedure: Secondary | ICD-10-CM | POA: Diagnosis present

## 2024-09-18 DIAGNOSIS — Z8419 Family history of other disorders of kidney and ureter: Secondary | ICD-10-CM

## 2024-09-18 DIAGNOSIS — I1A0 Resistant hypertension: Secondary | ICD-10-CM | POA: Diagnosis present

## 2024-09-18 DIAGNOSIS — Z951 Presence of aortocoronary bypass graft: Secondary | ICD-10-CM

## 2024-09-18 DIAGNOSIS — I4719 Other supraventricular tachycardia: Secondary | ICD-10-CM | POA: Diagnosis present

## 2024-09-18 DIAGNOSIS — Z8719 Personal history of other diseases of the digestive system: Secondary | ICD-10-CM

## 2024-09-18 DIAGNOSIS — R339 Retention of urine, unspecified: Secondary | ICD-10-CM

## 2024-09-18 DIAGNOSIS — I471 Supraventricular tachycardia, unspecified: Secondary | ICD-10-CM | POA: Diagnosis present

## 2024-09-18 DIAGNOSIS — I440 Atrioventricular block, first degree: Secondary | ICD-10-CM | POA: Diagnosis present

## 2024-09-18 DIAGNOSIS — K219 Gastro-esophageal reflux disease without esophagitis: Secondary | ICD-10-CM | POA: Diagnosis present

## 2024-09-18 DIAGNOSIS — N390 Urinary tract infection, site not specified: Secondary | ICD-10-CM | POA: Diagnosis present

## 2024-09-18 DIAGNOSIS — I701 Atherosclerosis of renal artery: Secondary | ICD-10-CM | POA: Diagnosis present

## 2024-09-18 DIAGNOSIS — Z8619 Personal history of other infectious and parasitic diseases: Secondary | ICD-10-CM

## 2024-09-18 DIAGNOSIS — Z953 Presence of xenogenic heart valve: Secondary | ICD-10-CM

## 2024-09-18 DIAGNOSIS — R972 Elevated prostate specific antigen [PSA]: Secondary | ICD-10-CM

## 2024-09-18 DIAGNOSIS — I5032 Chronic diastolic (congestive) heart failure: Secondary | ICD-10-CM | POA: Diagnosis present

## 2024-09-18 LAB — URINALYSIS, W/ REFLEX TO CULTURE (INFECTION SUSPECTED)
Bilirubin Urine: NEGATIVE
Glucose, UA: NEGATIVE mg/dL
Hgb urine dipstick: NEGATIVE
Ketones, ur: NEGATIVE mg/dL
Nitrite: NEGATIVE
Protein, ur: NEGATIVE mg/dL
RBC / HPF: 0 RBC/hpf (ref 0–5)
Specific Gravity, Urine: 1.004 — ABNORMAL LOW (ref 1.005–1.030)
pH: 6 (ref 5.0–8.0)

## 2024-09-18 LAB — COMPREHENSIVE METABOLIC PANEL WITH GFR
ALT: 10 U/L (ref 0–44)
AST: 21 U/L (ref 15–41)
Albumin: 3.3 g/dL — ABNORMAL LOW (ref 3.5–5.0)
Alkaline Phosphatase: 47 U/L (ref 38–126)
Anion gap: 11 (ref 5–15)
BUN: 34 mg/dL — ABNORMAL HIGH (ref 8–23)
CO2: 21 mmol/L — ABNORMAL LOW (ref 22–32)
Calcium: 8.5 mg/dL — ABNORMAL LOW (ref 8.9–10.3)
Chloride: 106 mmol/L (ref 98–111)
Creatinine, Ser: 2.12 mg/dL — ABNORMAL HIGH (ref 0.61–1.24)
GFR, Estimated: 30 mL/min — ABNORMAL LOW (ref >60)
Glucose, Bld: 102 mg/dL — ABNORMAL HIGH (ref 70–99)
Potassium: 4 mmol/L (ref 3.5–5.1)
Sodium: 138 mmol/L (ref 135–145)
Total Bilirubin: 0.6 mg/dL (ref 0.0–1.2)
Total Protein: 6.5 g/dL (ref 6.5–8.1)

## 2024-09-18 LAB — CBC WITH DIFFERENTIAL/PLATELET
Abs Immature Granulocytes: 0.01 K/uL (ref 0.00–0.07)
Basophils Absolute: 0 K/uL (ref 0.0–0.1)
Basophils Relative: 0 %
Eosinophils Absolute: 0.2 K/uL (ref 0.0–0.5)
Eosinophils Relative: 5 %
HCT: 28.4 % — ABNORMAL LOW (ref 39.0–52.0)
Hemoglobin: 9 g/dL — ABNORMAL LOW (ref 13.0–17.0)
Immature Granulocytes: 0 %
Lymphocytes Relative: 23 %
Lymphs Abs: 0.9 K/uL (ref 0.7–4.0)
MCH: 31.4 pg (ref 26.0–34.0)
MCHC: 31.7 g/dL (ref 30.0–36.0)
MCV: 99 fL (ref 80.0–100.0)
Monocytes Absolute: 0.3 K/uL (ref 0.1–1.0)
Monocytes Relative: 9 %
Neutro Abs: 2.5 K/uL (ref 1.7–7.7)
Neutrophils Relative %: 63 %
Platelets: 165 K/uL (ref 150–400)
RBC: 2.87 MIL/uL — ABNORMAL LOW (ref 4.22–5.81)
RDW: 15.2 % (ref 11.5–15.5)
WBC: 4 K/uL (ref 4.0–10.5)
nRBC: 0 % (ref 0.0–0.2)

## 2024-09-18 LAB — MAGNESIUM: Magnesium: 2.3 mg/dL (ref 1.7–2.4)

## 2024-09-18 LAB — TSH: TSH: 4.312 u[IU]/mL (ref 0.350–4.500)

## 2024-09-18 LAB — TROPONIN I (HIGH SENSITIVITY)
Troponin I (High Sensitivity): 14 ng/L (ref <18)
Troponin I (High Sensitivity): 14 ng/L (ref <18)

## 2024-09-18 LAB — LACTIC ACID, PLASMA: Lactic Acid, Venous: 1.5 mmol/L (ref 0.5–1.9)

## 2024-09-18 LAB — CBG MONITORING, ED: Glucose-Capillary: 99 mg/dL (ref 70–99)

## 2024-09-18 MED ORDER — LISINOPRIL 5 MG PO TABS
5.0000 mg | ORAL_TABLET | Freq: Every day | ORAL | Status: DC
Start: 2024-09-19 — End: 2024-09-20
  Administered 2024-09-19 – 2024-09-20 (×2): 5 mg via ORAL
  Filled 2024-09-18 (×2): qty 1

## 2024-09-18 MED ORDER — TAMSULOSIN HCL 0.4 MG PO CAPS
0.4000 mg | ORAL_CAPSULE | Freq: Every day | ORAL | Status: DC
  Administered 2024-09-18 – 2024-09-20 (×3): 0.4 mg via ORAL
  Filled 2024-09-18 (×3): qty 1

## 2024-09-18 MED ORDER — PANTOPRAZOLE SODIUM 40 MG PO TBEC
40.0000 mg | DELAYED_RELEASE_TABLET | Freq: Every day | ORAL | Status: DC
  Administered 2024-09-19 – 2024-09-20 (×2): 40 mg via ORAL
  Filled 2024-09-18 (×2): qty 1

## 2024-09-18 MED ORDER — SENNOSIDES-DOCUSATE SODIUM 8.6-50 MG PO TABS
1.0000 | ORAL_TABLET | Freq: Every evening | ORAL | Status: DC | PRN
  Filled 2024-09-18: qty 1

## 2024-09-18 MED ORDER — ISOSORBIDE MONONITRATE ER 60 MG PO TB24
30.0000 mg | ORAL_TABLET | Freq: Every day | ORAL | Status: DC
  Administered 2024-09-19 – 2024-09-20 (×2): 30 mg via ORAL
  Filled 2024-09-18 (×2): qty 1

## 2024-09-18 MED ORDER — ENOXAPARIN SODIUM 30 MG/0.3ML IJ SOSY
30.0000 mg | PREFILLED_SYRINGE | INTRAMUSCULAR | Status: DC
  Administered 2024-09-18 – 2024-09-19 (×2): 30 mg via SUBCUTANEOUS
  Filled 2024-09-18 (×2): qty 0.3

## 2024-09-18 MED ORDER — SODIUM CHLORIDE 0.9 % IV SOLN
2.0000 g | Freq: Once | INTRAVENOUS | Status: AC
  Administered 2024-09-18: 2 g via INTRAVENOUS
  Filled 2024-09-18: qty 12.5

## 2024-09-18 MED ORDER — ASPIRIN 81 MG PO TBEC
81.0000 mg | DELAYED_RELEASE_TABLET | Freq: Every day | ORAL | Status: DC
  Administered 2024-09-19 – 2024-09-20 (×2): 81 mg via ORAL
  Filled 2024-09-18 (×2): qty 1

## 2024-09-18 MED ORDER — SODIUM CHLORIDE 0.9 % IV BOLUS
500.0000 mL | Freq: Once | INTRAVENOUS | Status: AC
  Administered 2024-09-18: 500 mL via INTRAVENOUS

## 2024-09-18 MED ORDER — VITAMIN D 25 MCG (1000 UNIT) PO TABS
1000.0000 [IU] | ORAL_TABLET | Freq: Every day | ORAL | Status: DC
  Administered 2024-09-19 – 2024-09-20 (×2): 1000 [IU] via ORAL
  Filled 2024-09-18 (×2): qty 1

## 2024-09-18 MED ORDER — ACETAMINOPHEN 650 MG RE SUPP: 650.0000 mg | Freq: Four times a day (QID) | RECTAL | Status: DC | PRN

## 2024-09-18 MED ORDER — ACETAMINOPHEN 325 MG PO TABS
650.0000 mg | ORAL_TABLET | Freq: Four times a day (QID) | ORAL | Status: DC | PRN
  Administered 2024-09-19 – 2024-09-20 (×3): 650 mg via ORAL
  Filled 2024-09-18 (×3): qty 2

## 2024-09-18 MED ORDER — ATORVASTATIN CALCIUM 20 MG PO TABS
40.0000 mg | ORAL_TABLET | Freq: Every day | ORAL | Status: DC
  Administered 2024-09-19 – 2024-09-20 (×2): 40 mg via ORAL
  Filled 2024-09-18 (×2): qty 2

## 2024-09-18 MED ORDER — AMLODIPINE BESYLATE 5 MG PO TABS
5.0000 mg | ORAL_TABLET | Freq: Every day | ORAL | Status: DC
Start: 2024-09-19 — End: 2024-09-19
  Administered 2024-09-19: 5 mg via ORAL
  Filled 2024-09-18: qty 1

## 2024-09-18 NOTE — Progress Notes (Signed)
  Chaplain On-Call responded to Rapid Response notification for the [facility].  Chaplain arrived and learned from Staff that the patient was taken to the Emergency Department, room 13.  Chaplain went to the ED and learned that the patient was in CT scan area.  Chaplain assured Staff of availability as needed.  Chaplain Bebe Ardean EMERSON Hershal., [facility]

## 2024-09-18 NOTE — ED Triage Notes (Signed)
 Pt to ED after rapid response was called on pt in the urology clinic. Pt was seeing urology to be evaluated for UTI. Pt was sitting in lobby talking to visitor and had syncopal episode. When rapid response team arrived pt was alert and oriented. Pt was clammy. Urology clinic reported that pt did not hit his head. Pt has a urinary catheter in place, pt has purple bag syndrome

## 2024-09-18 NOTE — H&P (Incomplete)
 History and Physical    GADGE HERMIZ FMW:[id number] DOB: 07/18/1936 DOA: 09/18/2024  DOS: the patient was seen and examined on 09/18/2024  PCP: Donal Channing SQUIBB, FNP   Patient coming from: {Blank single:19197::Home,Clinic,SNF,ALF,Group Home,Hospice,***}  I have personally briefly reviewed patient's old medical records in [facility] Link and CareEverywhere  HPI:   JIBRIL MCMINN is a 88 y.o. year old male with past medical history of ***  ED Course: On arrival to the ED patient was noted to be HDS stable.  Imaging: *** Labs: *** Meds/Therapies: *** Consultants/recommendations/plan: ***  TRH contacted for admission.  Review of Systems: {ROS_Text:26778}   Past Medical History:  Diagnosis Date   Anemia    Aortic insufficiency    a. 02/2018 s/p bioprosthetic AVR; 04/2018 Echo: EF 50-55%, Gr2 DD, Ao bioprosthesis, mean grad , Ao root/Asc Ao nl in size, mild MR, mildly dil LA, nl RV fxn. Nl PASP.   Arthropathy    Ascending aortic aneurysm    a. 12/2016 MRA: 4.3cm @ sinus of valsalva, 4.9cm above sinotubular jxn; b. 02/2018 s/p biological Bentall and AVR.   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 02/2018 Cath: LAD 20ost, 40p, 22m, LCX 60p, OM2 60, OM3 85, RCA 10p/m w/ L->R and R->R collats;  b. 02/2018 CABG x 3: LIMA to LAD, SVG to OM2, SVG to PDA, EVH via right thigh.   CKD (chronic kidney disease), stage III (HCC)    Diverticulitis    Essential hypertension    GERD (gastroesophageal reflux disease)    Hemorrhoids    History of chicken pox    History of Helicobacter pylori infection 06/2007   Hyperlipidemia    Hypertension    Left renal artery stenosis    a. 01/2017 RA u/s: moderate L RAS.   Lower extremity edema    Nonrheumatic mitral (valve) insufficiency    a. 12/2016 Echo: mild to mod MR; b. 09/2017 TEE: mild to mod MR; c. 04/2018 Echo: Mild MR.   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 s/p 21 mm Edwards Inspiris Resilia stented bovine pericardial tissue valve and 24 mm Gelweave Valsalva synthetic root conduit with reimplantation of left main coronary artery.    Past Surgical History:  Procedure Laterality Date   AORTIC VALVE REPAIR N/A 02/22/2018   Procedure: AORTIC VALVE REPLACEMENT -Biological Bentall aortic root replacement,;  Surgeon: Dusty Sudie DEL, MD;  Location: [facility] OR;  Service: Open Heart Surgery;  Laterality: N/A;   bypass surgery     CARDIAC CATHETERIZATION     01/16/2018   COLONOSCOPY  2011   by Dr. Ora with findings of diverticulosis   CORONARY ARTERY BYPASS GRAFT N/A 02/22/2018   Procedure: CORONARY ARTERY BYPASS GRAFTING (CABG) x three, using left internal mammary artery and right leg greater saphenous vein harvested endoscopically;  Surgeon: Dusty Sudie DEL, MD;  Location: [facility] OR;  Service: Open Heart Surgery;  Laterality: N/A;   CORONARY/GRAFT ANGIOGRAPHY N/A 01/16/2018   Procedure: CORONARY/GRAFT ANGIOGRAPHY;  Surgeon: Mady Bruckner, MD;  Location: [facility] INVASIVE CV LAB;  Service: Cardiovascular;  Laterality: N/A;   ELBOW SURGERY Left    hemorhoidectomy     IR BONE MARROW BIOPSY & ASPIRATION  01/30/2024   IR CATHETER TUBE CHANGE  07/30/2024   IR CYSTOSTOMY TUBE PLACEMENT/BLADDER ASPIRATION  05/22/2024   RIGHT HEART CATH N/A 01/16/2018  Procedure: RIGHT HEART CATH;  Surgeon: Mady Bruckner, MD;  Location: [facility] INVASIVE CV LAB;  Service: Cardiovascular;  Laterality: N/A;   SHOULDER ARTHROSCOPY WITH ROTATOR CUFF REPAIR Right    SHOULDER SURGERY Right    TEE WITHOUT CARDIOVERSION N/A 09/27/2017   Procedure: TRANSESOPHAGEAL ECHOCARDIOGRAM (TEE);  Surgeon: Maranda Leim DEL, MD;  Location: [facility] ENDOSCOPY;  Service: Cardiovascular;  Laterality: N/A;   TEE WITHOUT CARDIOVERSION N/A 02/22/2018   Procedure: TRANSESOPHAGEAL ECHOCARDIOGRAM (TEE);  Surgeon: Dusty Sudie DEL, MD;   Location: [facility] OR;  Service: Open Heart Surgery;  Laterality: N/A;   THORACIC AORTIC ANEURYSM REPAIR N/A 02/22/2018   Procedure: THORACIC ASCENDING ANEURYSM REPAIR (AAA) Resection of ascending aorta aneurysm;  Surgeon: Dusty Sudie DEL, MD;  Location: [facility] OR;  Service: Open Heart Surgery;  Laterality: N/A;     No Known Allergies  Family History  Problem Relation Age of Onset   Hypertension Mother    Stroke Mother    Leukemia Father    Heart attack Paternal Grandmother    Stroke Paternal Grandfather    Heart Problems Son 15   Heart Problems Son 39   Uterine cancer Sister    Prostate cancer Brother    Colon cancer Brother    Kidney failure Brother    Stroke Sister     Prior to Admission medications   Medication Sig Start Date End Date Taking? Authorizing Provider  amLODipine  (NORVASC ) 5 MG tablet Take 5 mg by mouth daily. 04/13/24  Yes [provider]  atorvastatin  (LIPITOR) 40 MG tablet Take 1 tablet (40 mg total) by mouth daily. 05/18/24  Yes End, Bruckner, MD  isosorbide  mononitrate (IMDUR ) 30 MG 24 hr tablet Take 1 tablet (30 mg total) by mouth daily. 05/28/24  Yes Furth, Cadence H, PA-C  lisinopril  (ZESTRIL ) 5 MG tablet TAKE 1 TABLET BY MOUTH DAILY Patient taking differently: Take 10 mg by mouth daily. 08/09/24  Yes End, Bruckner, MD  oxyCODONE  (OXY IR/ROXICODONE ) 5 MG immediate release tablet Take 5 mg by mouth every 6 (six) hours as needed. 08/16/23  Yes [provider]  pantoprazole  (PROTONIX ) 40 MG tablet Take 1 tablet (40 mg total) by mouth daily. 08/21/23 09/18/24 Yes Djan, Drue DASEN, MD  torsemide  (DEMADEX ) 10 MG tablet TAKE 1 TABLET BY MOUTH EVERY DAY AS NEEDED 07/11/23  Yes End, Bruckner, MD  traMADol  (ULTRAM ) 50 MG tablet Take 50 mg by mouth every 6 (six) hours as needed. 07/15/23  Yes [provider]  aspirin  EC 81 MG tablet Take 81 mg by mouth daily. Patient not taking: Reported on 09/18/2024    [provider]  cefUROXime  (CEFTIN ) 250  MG tablet Take 1 tablet (250 mg total) by mouth 2 (two) times daily with a meal for 7 days. Patient not taking: Reported on 09/18/2024 09/13/24 09/20/24  Dasie Tinnie MATSU, NP  cholecalciferol (VITAMIN D3) 25 MCG (1000 UT) tablet Take 1,000 Units by mouth daily. Patient not taking: Reported on 09/18/2024    [provider]  tamsulosin  (FLOMAX ) 0.4 MG CAPS capsule Take 1 capsule (0.4 mg total) by mouth daily. Patient not taking: Reported on 09/18/2024 01/05/24   Helon Kirsch A, PA-C      reports that he has never smoked. He has never used smokeless tobacco. He reports that he does not drink alcohol  and does not use drugs. Lives with Currently works at/ unemployed currently Tobacco- *** ppd x **** years since age/ Denies use. EtOH- *** shots/ week, *** beers/week. Last drink was ***. Denies use.  Illicit drug use- denies use.  IADLs/ADLs- can person independently at baseline    Physical Exam: Vitals:   09/18/24 1530 09/18/24 1532 09/18/24 1600 09/18/24 2100  BP: (!) 190/83  (!) 147/72 (!) 168/74  Pulse: (!) 52  (!) 41 (!) 48  Resp: 16  16 20   Temp:  97.7 F (36.5 C)  98.7 F (37.1 C)  TempSrc:  Oral  Oral  SpO2: 99%  99% 100%  Weight:      Height:         Gen: HENT: CV: Resp: Abd: MSK: Skin Neuro Psych:   Labs on Admission: I have personally reviewed following labs and imaging studies  CBC: Recent Labs  Lab 09/13/24 1316 09/18/24 1035  WBC 4.5 4.0  NEUTROABS  --  2.5  HGB 8.6* 9.0*  HCT 26.0* 28.4*  MCV 95.2 99.0  PLT 166 165   Basic Metabolic Panel: Recent Labs  Lab 09/13/24 1414 09/18/24 1035  NA 132* 138  K 4.8 4.0  CL 106 106  CO2 21* 21*  GLUCOSE 104* 102*  BUN 42* 34*  CREATININE 2.34* 2.12*  CALCIUM  8.5* 8.5*  MG  --  2.3   GFR: Estimated Creatinine Clearance: 22.3 mL/min (A) (by C-G formula based on SCr of 2.12 mg/dL (H)). Liver Function Tests: Recent Labs  Lab 09/13/24 1414 09/18/24 1035  AST 16 21  ALT 9 10  ALKPHOS  55 47  BILITOT 0.8 0.6  PROT 6.5 6.5  ALBUMIN  3.4* 3.3*   No results for input(s): LIPASE, AMYLASE in the last 168 hours. No results for input(s): AMMONIA in the last 168 hours. Coagulation Profile: No results for input(s): INR, PROTIME in the last 168 hours. Cardiac Enzymes: Recent Labs  Lab 09/18/24 1035 09/18/24 1232  TROPONINIHS 14 14   BNP (last 3 results) No results for input(s): BNP in the last 8760 hours. HbA1C: No results for input(s): HGBA1C in the last 72 hours. CBG: Recent Labs  Lab 09/18/24 1033  GLUCAP 99   Lipid Profile: No results for input(s): CHOL, HDL, LDLCALC, TRIG, CHOLHDL, LDLDIRECT in the last 72 hours. Thyroid  Function Tests: Recent Labs    09/18/24 1035  TSH 4.312   Anemia Panel: No results for input(s): VITAMINB12, FOLATE, FERRITIN, TIBC, IRON , RETICCTPCT in the last 72 hours. Urine analysis:    Component Value Date/Time   COLORURINE STRAW (A) 09/18/2024 1534   APPEARANCEUR CLEAR (A) 09/18/2024 1534   APPEARANCEUR Clear 12/27/2023 1527   LABSPEC 1.004 (L) 09/18/2024 1534   PHURINE 6.0 09/18/2024 1534   GLUCOSEU NEGATIVE 09/18/2024 1534   HGBUR NEGATIVE 09/18/2024 1534   BILIRUBINUR NEGATIVE 09/18/2024 1534   BILIRUBINUR Negative 12/27/2023 1527   KETONESUR NEGATIVE 09/18/2024 1534   PROTEINUR NEGATIVE 09/18/2024 1534   NITRITE NEGATIVE 09/18/2024 1534   LEUKOCYTESUR MODERATE (A) 09/18/2024 1534    Radiological Exams on Admission: I have personally reviewed images CT ABDOMEN PELVIS WO CONTRAST Result Date: 09/18/2024 EXAM: CT ABDOMEN AND PELVIS WITHOUT CONTRAST 09/18/2024 03:03:08 PM TECHNIQUE: CT of the abdomen and pelvis was performed without the administration of intravenous contrast. Multiplanar reformatted images are provided for review. Automated exposure control, iterative reconstruction, and/or weight-based adjustment of the mA/kV was utilized to reduce the radiation dose to as low as  reasonably achievable. COMPARISON: CT abdomen and pelvis 08/21/2023. CLINICAL HISTORY: Abdominal pain, acute, nonlocalized; back pain, indwelling cath, concern for UTI/obstruction. FINDINGS: LOWER CHEST: There are calcified granulomas in the left lung base. LIVER: The liver is unremarkable. GALLBLADDER AND  BILE DUCTS: Gallstones are present. No biliary ductal dilatation. SPLEEN: No acute abnormality. PANCREAS: No acute abnormality. ADRENAL GLANDS: No acute abnormality. KIDNEYS, URETERS AND BLADDER: Suprapubic foley catheter is present. The bladder is moderately distended despite catheter placement. No surrounding inflammation. Bilateral renal cysts are present. The largest cyst on the right is in the inferior pole measuring 4.9 cm. The largest cyst in the inferior pole of the left kidney measures up to 6.6 cm. There is no urinary tract calculus or hydronephrosis. No perinephric or periureteral stranding. Per consensus, no follow-up is needed for simple Bosniak type 1 and 2 renal cysts, unless the patient has a malignancy history or risk factors. GI AND BOWEL: Stomach demonstrates no acute abnormality. There is sigmoid and descending colon diverticulosis. There is no bowel obstruction. The appendix appears normal. PERITONEUM AND RETROPERITONEUM: No ascites. No free air. VASCULATURE: Extensive atherosclerotic calcifications of the aorta, iliac arteries and branch vessels. LYMPH NODES: No lymphadenopathy. REPRODUCTIVE ORGANS: Prostate gland is enlarged. BONES AND SOFT TISSUES: Multilevel degenerative changes of the spine. Moderate-sized right inguinal hernia contains nondilated bowel, unchanged. IMPRESSION: 1. No acute findings in the abdomen or pelvis. 2. Cholelithiasis. 3. Moderate-sized right inguinal hernia containing nondilated bowel, unchanged. 4. Moderately distended urinary bladder with indwelling suprapubic catheter. 5. Bilateral renal cysts, largest measuring 6.6 cm on the left and 4.9 cm on the right. 6.  Prostatomegaly. 7. Colonic diverticulosis. Electronically signed by: Greig Pique MD 09/18/2024 03:19 PM EDT RP Workstation: [id number]   DG Chest Portable 1 View Result Date: 09/18/2024 EXAM: 1 VIEW XRAY OF THE CHEST 09/18/2024 11:08:00 AM COMPARISON: 08/23/2023 CLINICAL HISTORY: Syncope. Pt to ED after rapid response was called on pt in the urology clinic. Pt was seeing urology to be evaluated for UTI. Pt was sitting in lobby talking to visitor and had syncopal episode. When rapid response team arrived pt was alert and oriented. Pt was clammy. Urology clinic reported that pt did not hit his head. Pt has a urinary catheter in place, pt has purple bag syndrome. FINDINGS: LUNGS AND PLEURA: No focal pulmonary opacity. No pulmonary edema. No pleural effusion. No pneumothorax. HEART AND MEDIASTINUM: Stable cardiomediastinal silhouette. Status post coronary artery bypass graft and aortic valve repair. BONES AND SOFT TISSUES: No acute osseous abnormality. IMPRESSION: 1. No acute cardiopulmonary disease. 2. Postsurgical changes related to prior coronary artery bypass grafting and aortic valve repair. Electronically signed by: Lynwood Seip MD 09/18/2024 12:08 PM EDT RP Workstation: [id number]   CT Head Wo Contrast Result Date: 09/18/2024 EXAM: CT HEAD WITHOUT CONTRAST 09/18/2024 11:18:00 AM TECHNIQUE: CT of the head was performed without the administration of intravenous contrast. Automated exposure control, iterative reconstruction, and/or weight based adjustment of the mA/kV was utilized to reduce the radiation dose to as low as reasonably achievable. COMPARISON: CT head 07/26/2023. CLINICAL HISTORY: Syncope/presyncope, cerebrovascular cause suspected. Patient to ED after rapid response was called in urology clinic. FINDINGS: BRAIN AND VENTRICLES: There is no evidence of an acute infarct, intracranial hemorrhage, mass, midline shift, hydrocephalus, or acute extra-axial fluid collection. There is mild cerebral  atrophy. A small chronic right parietal infarct is unchanged. Patchy hypodensities elsewhere in the cerebral white matter bilaterally are unchanged and nonspecific but compatible with mild chronic small vessel ischemic disease. A small amount of asymmetric low density extra axial fluid over the left frontal convexity measuring up to 3 mm in thickness is unchanged and may be secondary to cerebral atrophy or reflective of a tiny subdural hygroma without mass effect. Calcified atherosclerosis at  the skull base. ORBITS: Bilateral cataract extraction. SINUSES: No acute abnormality. SOFT TISSUES AND SKULL: No acute soft tissue abnormality. No skull fracture. IMPRESSION: 1. No acute intracranial abnormality. 2. Mild chronic small vessel ischemic disease. Small chronic right parietal infarct. Electronically signed by: Dasie Hamburg MD 09/18/2024 11:36 AM EDT RP Workstation: [id number]    EKG: My personal interpretation of EKG shows: ***    Assessment/Plan Principal Problem:   Syncope     VTE prophylaxis:  {Blank single:19197::Lovenox ,SQ Heparin ,IV heparin  gtts,Xarelto,Eliquis,Coumadin ,SCDs,***}  Diet: Code Status:  {Blank single:19197::Full Code,DNR with Intubation,DNR/DNI(Do NOT Intubate),Comfort Care,***} Telemetry:  Admission status: {Blank single:19197::Observation,Inpatient}, {Blank single:19197::Med-Surg,Telemetry bed,Progressive,Step Down Unit} Patient is from: ***  Anticipated d/c is to: ***  Anticipated d/c is in: *** days   Family Communication: ***   Consults called: ***    Severity of Illness: {Observation/Inpatient:21159}   Morene Bathe, MD Jolynn DEL. [facility]

## 2024-09-18 NOTE — Progress Notes (Signed)
 PHARMACIST - PHYSICIAN COMMUNICATION  CONCERNING:  Enoxaparin  (Lovenox ) for DVT Prophylaxis    RECOMMENDATION: Patient was prescribed enoxaprin 40mg  q24 hours for VTE prophylaxis.   Filed Weights   09/18/24 1027  Weight: 63 kg (138 lb 14.2 oz)    Body mass index is 21.75 kg/m.  Estimated Creatinine Clearance: 22.3 mL/min (A) (by C-G formula based on SCr of 2.12 mg/dL (H)).  Patient is candidate for enoxaparin  30mg  every 24 hours based on CrCl <98ml/min or Weight <45kg  DESCRIPTION: Pharmacy has adjusted enoxaparin  dose per [facility] policy.  Patient is now receiving enoxaparin  30 mg every 24 hours    Damien Napoleon, PharmD Clinical Pharmacist  09/18/2024 7:11 PM

## 2024-09-18 NOTE — ED Provider Notes (Signed)
 [facility] Provider Note    Event Date/Time   First MD Initiated Contact with Patient 09/18/24 1022     (approximate)   History   Loss of Consciousness   HPI  Rodney Wiley is a 88 y.o. male past medical history significant for aortic stenosis with prior replacement, indwelling Foley catheter, who presents to the emergency department following an episode of syncope.  History is provided by the patient and his son at bedside.  States that he has been not feeling well for the past week.  Had a couple of UAs that had looked concerning for possible urinary tract infection.  Patient has had urine that has looked very purple and contaminated during recent culture.  Was waiting in urology waiting room and had an episode of syncope.  Was sitting down and then passed out his eyes rolled back in his head and he was out for a couple of minutes.  No urinary or bowel incontinence and no shaking at that time.  Slowly returned back to his normal.  Patient complains of mild back pain which has been ongoing since the past week.  Denies any chest pain or shortness of breath.  Did not hit his head.  Not on anticoagulation.  States that he does not recall the events of today.  Family member states that he has been having symptoms of urge to urinate and back pain which has been consistent with prior urinary tract infections and he has not had any urinary tract infections in a long time.  Last time his indwelling suprapubic catheter was exchanged was in August.  Was placed in January.     Physical Exam   Triage Vital Signs: ED Triage Vitals  Encounter Vitals Group     BP 09/18/24 1026 103/67     Girls Systolic BP Percentile --      Girls Diastolic BP Percentile --      Boys Systolic BP Percentile --      Boys Diastolic BP Percentile --      Pulse Rate 09/18/24 1026 68     Resp 09/18/24 1026 18     Temp --      Temp src --      SpO2 09/18/24 1026 99 %     Weight 09/18/24 1027  138 lb 14.2 oz (63 kg)     Height 09/18/24 1027 5' 7 (1.702 m)     Head Circumference --      Peak Flow --      Pain Score 09/18/24 1027 0     Pain Loc --      Pain Education --      Exclude from Growth Chart --     Most recent vital signs: Vitals:   09/18/24 1500 09/18/24 1532  BP: (!) 160/66   Pulse: (!) 49   Resp: 19   Temp:  97.7 F (36.5 C)  SpO2: 100%     Physical Exam Constitutional:      Appearance: He is well-developed.  HENT:     Head: Atraumatic.  Eyes:     Extraocular Movements: Extraocular movements intact.     Conjunctiva/sclera: Conjunctivae normal.     Pupils: Pupils are equal, round, and reactive to light.  Cardiovascular:     Rate and Rhythm: Regular rhythm. Bradycardia present.     Heart sounds: Murmur heard.  Pulmonary:     Effort: No respiratory distress.  Abdominal:     Tenderness: There is no  abdominal tenderness. There is no right CVA tenderness or left CVA tenderness.     Comments: Suprapubic catheter in place with debris around the insertion site.  Catheter bag appears purple  Musculoskeletal:        General: Normal range of motion.     Cervical back: Normal range of motion.     Right lower leg: No edema.     Left lower leg: No edema.  Skin:    General: Skin is warm.     Capillary Refill: Capillary refill takes less than 2 seconds.  Neurological:     Mental Status: He is alert. Mental status is at baseline.     IMPRESSION / MDM / ASSESSMENT AND PLAN / ED COURSE  I reviewed the triage vital signs and the nursing notes.  Differential diagnosis including progression of aortic stenosis, dehydration, orthostatic hypotension, dysrhythmia, ACS, urinary tract infection  EKG  I, Clotilda Punter, the attending physician, personally viewed and interpreted this ECG.  EKG showed sinus bradycardia with a heart rate of 43.  Decreased heart rate but no significant change when compared to prior EKG. prolonged PR interval of 300s.  QTc  456  Bradycardia with a heart rate in the 40s while on cardiac telemetry.  RADIOLOGY I independently reviewed imaging, my interpretation of imaging: CT scan of the head with no signs of intracranial hemorrhage or infarction  CT scan abdomen and pelvis with no acute findings in the abdomen or pelvis.  Cholelithiasis.  Inguinal hernia.  Unchanged from prior.  Bilateral renal cyst.  Chest x-ray with no signs of pneumonia.  LABS (all labs ordered are listed, but only abnormal results are displayed) Labs interpreted as -    Labs Reviewed  COMPREHENSIVE METABOLIC PANEL WITH GFR - Abnormal; Notable for the following components:      Result Value   CO2 21 (*)    Glucose, Bld 102 (*)    BUN 34 (*)    Creatinine, Ser 2.12 (*)    Calcium  8.5 (*)    Albumin  3.3 (*)    GFR, Estimated 30 (*)    All other components within normal limits  CBC WITH DIFFERENTIAL/PLATELET - Abnormal; Notable for the following components:   RBC 2.87 (*)    Hemoglobin 9.0 (*)    HCT 28.4 (*)    All other components within normal limits  LACTIC ACID, PLASMA  MAGNESIUM   TSH  URINALYSIS, W/ REFLEX TO CULTURE (INFECTION SUSPECTED)  CBG MONITORING, ED  TROPONIN I (HIGH SENSITIVITY)  TROPONIN I (HIGH SENSITIVITY)     MDM    Patient's lab work is overall unremarkable with no significant leukocytosis.  No significant anemia when compared to prior.  No signs or symptoms consistent with a GI bleed.  Troponin is negative at 14.  Kidney function is at his baseline.  Given that patient has had ongoing symptoms of urinary tract infection on chart review has had a UA that looked concerning for urinary tract infection but grew out greater than 100,000 CFU's with contaminated species and recommended recollect.  Patient has had symptoms of urinary tract infection and given his syncopal episode today we will go ahead and treat with IV cefepime.  Consulted hospitalist and discussed we will try to obtain a clean urine after  emptying the bag.  Will possibly need a suprapubic catheter exchanged with urology but do not feel that that is necessary in the emergency department at this time.  Patient admitted for syncope.   PROCEDURES:  Critical  Care performed: No  Procedures  Patient's presentation is most consistent with acute presentation with potential threat to life or bodily function.   MEDICATIONS ORDERED IN ED: Medications  ceFEPIme (MAXIPIME) 2 g in sodium chloride  0.9 % 100 mL IVPB (2 g Intravenous New Bag/Given 09/18/24 1530)  sodium chloride  0.9 % bolus 500 mL (0 mLs Intravenous Stopped 09/18/24 1234)    FINAL CLINICAL IMPRESSION(S) / ED DIAGNOSES   Final diagnoses:  Syncope, unspecified syncope type  Urinary tract infection associated with indwelling urethral catheter, initial encounter     Rx / DC Orders   ED Discharge Orders     None        Note:  This document was prepared using Dragon voice recognition software and may include unintentional dictation errors.   Suzanne Kirsch, MD 09/18/24 1600

## 2024-09-18 NOTE — Hospital Course (Addendum)
 Patient is a 88 year old male with past medical history of hypertension, BPH status post suprapubic cath, hyperlipidemia, CAD status post CABG, HFpEF, aortic stenosis status post replacement with recent echo showing gradient at 20 mmHg presenting to the ED after a syncopal episode at the urology clinic.  Rapid response was called and patient was brought to the ED for further evaluation.   Patient states he broke out in a sweat and then nursing staff came and checked on him and then went out. He never felt like he was going to pass out and had similar episodes about 5 this year but never passed out in his life. Most of these spells occur while he is sitting. Pt reports blurry vision, and flushing during this episode.    In the ED patient was hemodynamically stable.  Heart rate was in the 40s and patient has been known to be slightly bradycardic.  Lab work showed stable chronic abnormalities and none that would explain patient's current presentation.  Patient was given IV fluids by EDP and started on cefepime given concern for UTI.  TRH was consulted to evaluate for admission to evaluate his syncopal episode.   Has catheter changed in 08/25. Straight Catheter placed since January due to BPH.  .  Lives with by himself. Uses a walker or cane.  Not current smoking but some in his 11s.  Drinking: doesn't drink ADL/IADLS   Exam: Physical Exam General: NAD HENT: NCAT Lungs: CTAB Cardiovascular: bradycardic rate, regular rhythm, pansystolic murmur 3/6, good radial pulse  Abdomen: No TTP, catheter site without redness, urinary bag is purple.  MSK: no asymmetry Neuro: alert and oriented x4  Psych: pleasant mood.      No chest pain or dyspnea. Has had simliar episode about a month ago.   Was to be referred to EP, has not seen them yet.    Physical Exam  Constitutional: In no distress.  Cardiovascular: Bradycardic rate. No lower extremity edema  Pulmonary: Non labored breathing on room  air, no wheezing or rales.   Abdominal: Soft. Non distended and non tender Musculoskeletal: Normal range of motion.     Neurological: Alert and oriented to person, place, and time. Non focal  Skin: Skin is warm and dry.

## 2024-09-19 DIAGNOSIS — D509 Iron deficiency anemia, unspecified: Secondary | ICD-10-CM | POA: Diagnosis present

## 2024-09-19 DIAGNOSIS — I4719 Other supraventricular tachycardia: Secondary | ICD-10-CM | POA: Diagnosis present

## 2024-09-19 DIAGNOSIS — K219 Gastro-esophageal reflux disease without esophagitis: Secondary | ICD-10-CM | POA: Diagnosis present

## 2024-09-19 DIAGNOSIS — I340 Nonrheumatic mitral (valve) insufficiency: Secondary | ICD-10-CM | POA: Diagnosis present

## 2024-09-19 DIAGNOSIS — I5032 Chronic diastolic (congestive) heart failure: Secondary | ICD-10-CM | POA: Diagnosis present

## 2024-09-19 DIAGNOSIS — I10 Essential (primary) hypertension: Secondary | ICD-10-CM

## 2024-09-19 DIAGNOSIS — R55 Syncope and collapse: Secondary | ICD-10-CM | POA: Diagnosis present

## 2024-09-19 DIAGNOSIS — I471 Supraventricular tachycardia, unspecified: Secondary | ICD-10-CM | POA: Diagnosis present

## 2024-09-19 DIAGNOSIS — I25118 Atherosclerotic heart disease of native coronary artery with other forms of angina pectoris: Secondary | ICD-10-CM | POA: Diagnosis present

## 2024-09-19 DIAGNOSIS — N390 Urinary tract infection, site not specified: Secondary | ICD-10-CM | POA: Diagnosis present

## 2024-09-19 DIAGNOSIS — Z952 Presence of prosthetic heart valve: Secondary | ICD-10-CM

## 2024-09-19 DIAGNOSIS — I701 Atherosclerosis of renal artery: Secondary | ICD-10-CM | POA: Diagnosis present

## 2024-09-19 DIAGNOSIS — Z7982 Long term (current) use of aspirin: Secondary | ICD-10-CM | POA: Diagnosis not present

## 2024-09-19 DIAGNOSIS — N400 Enlarged prostate without lower urinary tract symptoms: Secondary | ICD-10-CM | POA: Diagnosis present

## 2024-09-19 DIAGNOSIS — Z951 Presence of aortocoronary bypass graft: Secondary | ICD-10-CM | POA: Diagnosis not present

## 2024-09-19 DIAGNOSIS — I440 Atrioventricular block, first degree: Secondary | ICD-10-CM | POA: Diagnosis present

## 2024-09-19 DIAGNOSIS — E222 Syndrome of inappropriate secretion of antidiuretic hormone: Secondary | ICD-10-CM | POA: Diagnosis present

## 2024-09-19 DIAGNOSIS — Z79899 Other long term (current) drug therapy: Secondary | ICD-10-CM | POA: Diagnosis not present

## 2024-09-19 DIAGNOSIS — Z8249 Family history of ischemic heart disease and other diseases of the circulatory system: Secondary | ICD-10-CM | POA: Diagnosis not present

## 2024-09-19 DIAGNOSIS — T83511A Infection and inflammatory reaction due to indwelling urethral catheter, initial encounter: Secondary | ICD-10-CM | POA: Diagnosis present

## 2024-09-19 DIAGNOSIS — E785 Hyperlipidemia, unspecified: Secondary | ICD-10-CM | POA: Diagnosis present

## 2024-09-19 DIAGNOSIS — Y846 Urinary catheterization as the cause of abnormal reaction of the patient, or of later complication, without mention of misadventure at the time of the procedure: Secondary | ICD-10-CM | POA: Diagnosis present

## 2024-09-19 DIAGNOSIS — Z8719 Personal history of other diseases of the digestive system: Secondary | ICD-10-CM | POA: Diagnosis not present

## 2024-09-19 DIAGNOSIS — I130 Hypertensive heart and chronic kidney disease with heart failure and stage 1 through stage 4 chronic kidney disease, or unspecified chronic kidney disease: Secondary | ICD-10-CM | POA: Diagnosis present

## 2024-09-19 DIAGNOSIS — Z953 Presence of xenogenic heart valve: Secondary | ICD-10-CM | POA: Diagnosis not present

## 2024-09-19 DIAGNOSIS — R001 Bradycardia, unspecified: Secondary | ICD-10-CM | POA: Diagnosis not present

## 2024-09-19 DIAGNOSIS — D631 Anemia in chronic kidney disease: Secondary | ICD-10-CM | POA: Diagnosis present

## 2024-09-19 DIAGNOSIS — N184 Chronic kidney disease, stage 4 (severe): Secondary | ICD-10-CM | POA: Diagnosis present

## 2024-09-19 LAB — CBC
HCT: 26.3 % — ABNORMAL LOW (ref 39.0–52.0)
Hemoglobin: 8.5 g/dL — ABNORMAL LOW (ref 13.0–17.0)
MCH: 31.6 pg (ref 26.0–34.0)
MCHC: 32.3 g/dL (ref 30.0–36.0)
MCV: 97.8 fL (ref 80.0–100.0)
Platelets: 145 K/uL — ABNORMAL LOW (ref 150–400)
RBC: 2.69 MIL/uL — ABNORMAL LOW (ref 4.22–5.81)
RDW: 15.1 % (ref 11.5–15.5)
WBC: 4.7 K/uL (ref 4.0–10.5)
nRBC: 0 % (ref 0.0–0.2)

## 2024-09-19 LAB — BASIC METABOLIC PANEL WITH GFR
Anion gap: 7 (ref 5–15)
BUN: 29 mg/dL — ABNORMAL HIGH (ref 8–23)
CO2: 23 mmol/L (ref 22–32)
Calcium: 8.1 mg/dL — ABNORMAL LOW (ref 8.9–10.3)
Chloride: 108 mmol/L (ref 98–111)
Creatinine, Ser: 2.1 mg/dL — ABNORMAL HIGH (ref 0.61–1.24)
GFR, Estimated: 30 mL/min — ABNORMAL LOW (ref >60)
Glucose, Bld: 87 mg/dL (ref 70–99)
Potassium: 3.9 mmol/L (ref 3.5–5.1)
Sodium: 138 mmol/L (ref 135–145)

## 2024-09-19 MED ORDER — ISOSORBIDE MONONITRATE ER 60 MG PO TB24: 30.0000 mg | ORAL_TABLET | Freq: Every day | ORAL | Status: DC

## 2024-09-19 MED ORDER — HYDRALAZINE HCL 20 MG/ML IJ SOLN
10.0000 mg | Freq: Four times a day (QID) | INTRAMUSCULAR | Status: DC | PRN
  Administered 2024-09-19: 10 mg via INTRAVENOUS
  Filled 2024-09-19: qty 1

## 2024-09-19 MED ORDER — AMLODIPINE BESYLATE 5 MG PO TABS
5.0000 mg | ORAL_TABLET | Freq: Two times a day (BID) | ORAL | Status: DC
  Administered 2024-09-19 – 2024-09-20 (×2): 5 mg via ORAL
  Filled 2024-09-19 (×2): qty 1

## 2024-09-19 NOTE — ED Notes (Signed)
 Pt resting in stretcher, denies any needs at this time, foley bag emptied, pt alert, NAD noted, call bell within reach

## 2024-09-19 NOTE — ED Notes (Signed)
 Fall risk bundle in place.

## 2024-09-19 NOTE — Care Management Obs Status (Signed)
 MEDICARE OBSERVATION STATUS NOTIFICATION   Patient Details  Name: Rodney Wiley MRN: [id number] Date of Birth: 08/26/1936   Medicare Observation Status Notification Given:  Chaney BRANDY CHRISTIANE LELON, CMA 09/19/2024, 2:16 PM

## 2024-09-19 NOTE — ED Notes (Signed)
 Pt resting in stretcher, provider at bedside

## 2024-09-19 NOTE — Progress Notes (Signed)
 Progress Note   Patient: Rodney Wiley FMW:[id number] DOB: December 28, 1935 DOA: 09/18/2024     0 DOS: the patient was seen and examined on 09/19/2024   Brief hospital course: Per H&P HPI:   Patient is a 88 year old male with past medical history of hypertension, BPH status post suprapubic cath, hyperlipidemia, CAD status post CABG, HFpEF, aortic stenosis status post replacement with recent echo showing gradient at 20 mmHg presenting to the ED after a syncopal episode at the urology clinic.  Rapid response was called and patient was brought to the ED for further evaluation.   Patient states he broke out in a sweat and then nursing staff came and checked on him and he went out. He never felt like he was going to pass out and has had similar episodes about 5 this year but never passed out in his life. Most of these spells occur while he is sitting. Pt reports blurry vision, and flushing during this episode.    In the ED patient was hemodynamically stable.  Heart rate was in the 40s and patient has been known to be bradycardic.  Lab work showed stable chronic abnormalities and none that would explain patient's current presentation.  Patient was given IV fluids by EDP and started on cefepime given concern for UTI.  TRH was consulted to evaluate for admission to evaluate his syncopal episode.    Assessment and Plan: Syncope  Unclear exact etiology. Possibly vasovagal or orthostatic syncope.  Evaluated by cardiology who recommended zio patch at discharge and continued monitoring.  -Continue telemetry monitoring   Possible UTI  Was to be treated for UTI outpatient given PUBS, but patient did not take antibiotics. No fever or leukocytosis and no symptoms.  Urine culture 6 days ago with multiple species.  UA collected this hospitalization.  received 1x dose of cefepime in the ED. -F/u UCX -Get foley catheter exchanged   HTN Blood pressure low on arrival. Now elevated. His amlodipine  was  increased.   Normocytic anemia  Iron  deficiency  Recent hospitalization for GI bleed  Iron /TIBC/Ferritin/ %Sat    Component Value Date/Time   IRON  38 (L) 08/13/2024 1120   TIBC 262 08/13/2024 1120   FERRITIN 95 08/13/2024 1120   IRONPCTSAT 15 (L) 08/13/2024 1120  Hemoglobin stable. Continue iron  supplementation  Continue to monitor       Subjective: Patient denies chest pain, dyspnea or further episodes of syncope. Noted that he had a similar episode about a month ago. He was scheduled to see EP due to pSVT and bradycardia.   Physical Exam: Vitals:   09/19/24 0726 09/19/24 0800 09/19/24 1200 09/19/24 1551  BP:  (!) 176/70 (!) 173/91 (!) 170/73  Pulse:  (!) 47 (!) 50 (!) 58  Resp:  14 18 15   Temp: (!) 97.3 F (36.3 C)  (!) 97.5 F (36.4 C) 97.8 F (36.6 C)  TempSrc: Oral   Oral  SpO2:  100% 100% 99%  Weight:      Height:        Physical Exam  Constitutional: In no distress.  Cardiovascular: Bradycardic rate. No lower extremity edema  Pulmonary: Non labored breathing on room air, no wheezing or rales.   Abdominal: Soft. Non distended and non tender Musculoskeletal: Normal range of motion.     Neurological: Alert and oriented to person, place, and time. Non focal  Skin: Skin is warm and dry.   Data Reviewed:     Latest Ref Rng & Units 09/19/2024    6:00  AM 09/18/2024   10:35 AM 09/13/2024    1:16 PM  CBC  WBC 4.0 - 10.5 K/uL 4.7  4.0  4.5   Hemoglobin 13.0 - 17.0 g/dL 8.5  9.0  8.6   Hematocrit 39.0 - 52.0 % 26.3  28.4  26.0   Platelets 150 - 400 K/uL 145  165  166       Latest Ref Rng & Units 09/19/2024    6:00 AM 09/18/2024   10:35 AM 09/13/2024    2:14 PM  BMP  Glucose 70 - 99 mg/dL 87  897  895   BUN 8 - 23 mg/dL 29  34  42   Creatinine 0.61 - 1.24 mg/dL 7.89  7.87  7.65   Sodium 135 - 145 mmol/L 138  138  132   Potassium 3.5 - 5.1 mmol/L 3.9  4.0  4.8   Chloride 98 - 111 mmol/L 108  106  106   CO2 22 - 32 mmol/L 23  21  21    Calcium  8.9 - 10.3  mg/dL 8.1  8.5  8.5      Family Communication: None at bedside   Disposition: Status is: Inpatient Remains inpatient appropriate because: further monitoring and evaluation of syncope   Planned Discharge Destination: Home    Time spent: 35 minutes  Author: Alban Pepper, MD 09/19/2024 4:10 PM  For on call review [email or web address].

## 2024-09-19 NOTE — Consult Note (Signed)
 Cardiology Consultation   Patient ID: JUPITER KABIR MRN: [id number]; DOB: 1936/06/28  Admit date: 09/18/2024 Date of Consult: 09/19/2024  PCP:  Donal Channing SQUIBB, FNP   [facility] Providers Cardiologist:  Lonni Hanson, MD  Electrophysiologist:  OLE ONEIDA HOLTS, MD       Patient Profile: Infant Zink Owensby is a 88 y.o. male with a hx of CAD s/p CABG in 2019, thoracic aortic aneurysm and aortic regurgitation s/p Bentall procedure with bioprosthetic aortic valve replacement at time of CABG, chronic HFpEF, SVT, frequent PVCs, resistant hypertension, CKD stage IV with possible left renal artery stenosis who is being seen 09/19/2024 for the evaluation of syncope at the request of Dr. Fernand.  History of Present Illness:   Patient underwent CABG and bioprosthetic AVR in 02/2018 with LIMA to LAD, SVG to OM 2, and SVG to PDA.  24 mm synthetic aortic root graft and 21 mm Edwards Inspiris Roselia stented bovine pericardial tissue valve was used.  EF shortly after was 50 to 55% with G2 DD.  Heart monitor in 2020 showed sinus rhythm with PACs and frequent PVCs with runs of atrial tachycardia, PVC burden was 8.4%.  He was started on amiodarone  and a beta-blocker was subsequently discontinued due to AV block.  Echo 02/2023 showed EF 55 to 60%, mild LVH, G1 DD, and aortic valve peak gradient 18.7 mmHg.  Patient was seen in the ED 07/2023 with weakness, lightheadedness, elevated blood pressures, nausea, and diaphoresis for the past several weeks.  Troponin was elevated to 26.  EKG was nonacute.  Echo showed normal LV function, G2 DD, moderate MR.  Telemetry was unremarkable.  Blood pressure medications were adjusted and he was discharged.  Admitted to [facility] 08/2023 with hyponatremia secondary to SIADH, dyspnea, dysphagia with severe GERD (nausea and vomiting), normocytic anemia.  He underwent extensive workup to rule out underlying malignancy resulting in SIADGE.  Barium swallow showed severe GERD and  esophageal dysmotility, PPI was increased.  Admitted again to [facility] 08/2024 for lower GI bleed with the hemoglobin trending down to 8.3.  Upper and lower endoscopy noted significant diverticular process in the lower bowel as well as hemorrhoids.  Hemoglobin stabilized and he was discharged.    Mr. Creson reports one week of not feeling well. He reports back pain and possible UTI with abnormal urine color and odor. He reports waking up early for his urology appointment yesterday for further evaluation of possible UTI. He took his medicine on an empty stomach before leaving for his appointment. While in the waiting room, he had a syncopal episode. He endorses a hot flash and sweating prior to losing consciousness. He denies any preceding symptoms of chest pain, palpitations, lightheadedness, and dizziness. Rapid response was called. He returned to baseline slowly. He was taken to the ED where BP was 103/67 with otherwise normal vital signs. EKG shows sinus bradycardia with rate 43 bpm. Pertinent labs include BUN 34, Cr 2.12, hgb 9.0, and hct 28.4. Troponin negative x 2. CXR negative. CT head negative for acute changes. CT A/P without acute findings. UA positive for infection. He was started on cefepime and given IV fluids. Cardiology was asked to consult for further evaluation of syncope. At time of cardiology consult patient reports feeling better.  Denies chest pain, shortness of breath, lightheadedness, dizziness, and lower extremity swelling.   Past Medical History:  Diagnosis Date   Anemia    Aortic insufficiency    a. 02/2018 s/p bioprosthetic AVR; 04/2018 Echo: EF 50-55%,  Gr2 DD, Ao bioprosthesis, mean grad , Ao root/Asc Ao nl in size, mild MR, mildly dil LA, nl RV fxn. Nl PASP.   Arthropathy    Ascending aortic aneurysm    a. 12/2016 MRA: 4.3cm @ sinus of valsalva, 4.9cm above sinotubular jxn; b. 02/2018 s/p biological Bentall and AVR.   BPH (benign prostatic hyperplasia)    CAD S/P CABG x 3  02/22/2018   a. 02/2018 Cath: LAD 20ost, 40p, 51m, LCX 60p, OM2 60, OM3 85, RCA 10p/m w/ L->R and R->R collats;  b. 02/2018 CABG x 3: LIMA to LAD, SVG to OM2, SVG to PDA, EVH via right thigh.   CKD (chronic kidney disease), stage III (HCC)    Diverticulitis    Essential hypertension    GERD (gastroesophageal reflux disease)    Hemorrhoids    History of chicken pox    History of Helicobacter pylori infection 06/2007   Hyperlipidemia    Hypertension    Left renal artery stenosis    a. 01/2017 RA u/s: moderate L RAS.   Lower extremity edema    Nonrheumatic mitral (valve) insufficiency    a. 12/2016 Echo: mild to mod MR; b. 09/2017 TEE: mild to mod MR; c. 04/2018 Echo: Mild MR.   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 s/p 21 mm Edwards Inspiris Resilia stented bovine pericardial tissue valve and 24 mm Gelweave Valsalva synthetic root conduit with reimplantation of left main coronary artery.    Past Surgical History:  Procedure Laterality Date   AORTIC VALVE REPAIR N/A 02/22/2018   Procedure: AORTIC VALVE REPLACEMENT -Biological Bentall aortic root replacement,;  Surgeon: Dusty Sudie DEL, MD;  Location: [facility] OR;  Service: Open Heart Surgery;  Laterality: N/A;   bypass surgery     CARDIAC CATHETERIZATION     01/16/2018   COLONOSCOPY  2011   by Dr. Ora with findings of diverticulosis   CORONARY ARTERY BYPASS GRAFT N/A 02/22/2018   Procedure: CORONARY ARTERY BYPASS GRAFTING (CABG) x three, using left internal mammary artery and right leg greater saphenous vein harvested endoscopically;  Surgeon: Dusty Sudie DEL, MD;  Location: [facility] OR;  Service: Open Heart Surgery;  Laterality: N/A;   CORONARY/GRAFT ANGIOGRAPHY N/A 01/16/2018   Procedure: CORONARY/GRAFT ANGIOGRAPHY;  Surgeon: Mady Bruckner, MD;  Location: [facility] INVASIVE CV LAB;  Service: Cardiovascular;  Laterality: N/A;   ELBOW  SURGERY Left    hemorhoidectomy     IR BONE MARROW BIOPSY & ASPIRATION  01/30/2024   IR CATHETER TUBE CHANGE  07/30/2024   IR CYSTOSTOMY TUBE PLACEMENT/BLADDER ASPIRATION  05/22/2024   RIGHT HEART CATH N/A 01/16/2018   Procedure: RIGHT HEART CATH;  Surgeon: Mady Bruckner, MD;  Location: [facility] INVASIVE CV LAB;  Service: Cardiovascular;  Laterality: N/A;   SHOULDER ARTHROSCOPY WITH ROTATOR CUFF REPAIR Right    SHOULDER SURGERY Right    TEE WITHOUT CARDIOVERSION N/A 09/27/2017   Procedure: TRANSESOPHAGEAL ECHOCARDIOGRAM (TEE);  Surgeon: Maranda Leim DEL, MD;  Location: [facility] ENDOSCOPY;  Service: Cardiovascular;  Laterality: N/A;   TEE WITHOUT CARDIOVERSION N/A 02/22/2018   Procedure: TRANSESOPHAGEAL ECHOCARDIOGRAM (TEE);  Surgeon: Dusty Sudie DEL, MD;  Location: [facility] OR;  Service: Open Heart Surgery;  Laterality: N/A;   THORACIC AORTIC ANEURYSM REPAIR N/A 02/22/2018   Procedure: THORACIC ASCENDING ANEURYSM REPAIR (AAA) Resection of ascending aorta aneurysm;  Surgeon: Dusty Sudie DEL, MD;  Location: [facility] OR;  Service: Open Heart Surgery;  Laterality: N/A;       Scheduled Meds:  amLODipine   5 mg Oral Daily   aspirin  EC  81 mg Oral Daily   atorvastatin   40 mg Oral Daily   cholecalciferol  1,000 Units Oral Daily   enoxaparin  (LOVENOX ) injection  30 mg Subcutaneous Q24H   isosorbide  mononitrate  30 mg Oral Daily   lisinopril   5 mg Oral Daily   pantoprazole   40 mg Oral Daily   tamsulosin   0.4 mg Oral Daily   Continuous Infusions:  PRN Meds: acetaminophen  **OR** acetaminophen , senna-docusate  Allergies:   No Known Allergies  Social History:   Social History   Socioeconomic History   Marital status: Widowed    Spouse name: Not on file   Number of children: Not on file   Years of education: Not on file   Highest education level: Not on file  Occupational History   Not on file  Tobacco Use   Smoking status: Never   Smokeless tobacco: Never  Vaping Use   Vaping status: Never Used   Substance and Sexual Activity   Alcohol  use: No   Drug use: No   Sexual activity: Not Currently  Other Topics Concern   Not on file  Social History Narrative   Not on file   Social Drivers of Health   Financial Resource Strain: Low Risk  (08/15/2024)   Received from [facility]   Overall Financial Resource Strain (CARDIA)    How hard is it for you to pay for the very basics like food, housing, medical care, and heating?: Not very hard  Food Insecurity: No Food Insecurity (08/15/2024)   Received from [facility]   Hunger Vital Sign    Within the past 12 months, you worried that your food would run out before you got the money to buy more.: Never true    Within the past 12 months, the food you bought just didn't last and you didn't have money to get more.: Never true  Transportation Needs: No Transportation Needs (08/15/2024)   Received from [facility] - Transportation    Lack of Transportation (Medical): No    Lack of Transportation (Non-Medical): No  Physical Activity: Not on file  Stress: Not on file  Social Connections: Not on file  Intimate Partner Violence: Not At Risk (07/26/2023)   Humiliation, Afraid, Rape, and Kick questionnaire    Fear of Current or Ex-Partner: No    Emotionally Abused: No    Physically Abused: No    Sexually Abused: No    Family History:    Family History  Problem Relation Age of Onset   Hypertension Mother    Stroke Mother    Leukemia Father    Heart attack Paternal Grandmother    Stroke Paternal Grandfather    Heart Problems Son 31   Heart Problems Son 39   Uterine cancer Sister    Prostate cancer Brother    Colon cancer Brother    Kidney failure Brother    Stroke Sister      ROS:  Please see the history of present illness.   Physical Exam/Data: Vitals:   09/19/24 0400 09/19/24 0726 09/19/24 0726 09/19/24 0800  BP: (!) 143/68 (!) 163/80  (!) 176/70  Pulse: (!) 48 (!) 54  (!) 47  Resp: 18 20  14   Temp:    (!) 97.3 F (36.3 C)   TempSrc:   Oral   SpO2: 99% 100%  100%  Weight:      Height:        Intake/Output Summary (Last 24 hours) at 09/19/2024 1149 Last data filed at 09/19/2024 0554 Gross per 24 hour  Intake --  Output 600 ml  Net -600 ml      09/18/2024   10:27 AM 09/04/2024   10:47 AM 08/13/2024   11:49 AM  Last 3 Weights  Weight (lbs) 138 lb 14.2 oz 139 lb 12.8 oz 138 lb 4.8 oz  Weight (kg) 63 kg 63.413 kg 62.732 kg     Body mass index is 21.75 kg/m.  General:  Well nourished, well developed, in no acute distress HEENT: normal Neck: no JVD Vascular: No carotid bruits; Distal pulses 2+ bilaterally Cardiac:  normal S1, S2; RRR; + murmur  Lungs:  clear to auscultation bilaterally, no wheezing, rhonchi or rales  Abd: soft, nontender, no hepatomegaly  Ext: no edema Skin: warm and dry  Psych:  Normal affect   EKG:  The EKG was personally reviewed and demonstrates: Sinus bradycardia with first-degree AV block, rate 43 bpm with PR interval 320 ms Telemetry:  Telemetry was personally reviewed and demonstrates: Sinus bradycardia/sinus rhythm with first-degree AV block  Relevant CV Studies:  07/2024 Echo complete 1. Left ventricular ejection fraction, by estimation, is 60 to 65%. The  left ventricle has normal function. The left ventricle has no regional  wall motion abnormalities. There is moderate left ventricular hypertrophy.  Left ventricular diastolic  parameters are indeterminate. The average left ventricular global  longitudinal strain is -13.5 %. The global longitudinal strain is  abnormal.   2. Right ventricular systolic function is normal. The right ventricular  size is normal.   3. Left atrial size was moderately dilated.   4. The mitral valve is normal in structure. Moderate mitral valve  regurgitation. No evidence of mitral stenosis.   5. The aortic valve has been repaired/replaced, edwards Sapien  bioprosthetic aortic valve 02/2018. Aortic valve regurgitation  is not  visualized. Aortic valve area, by VTI measures 0.78 cm. Aortic valve mean  gradient measures 20.0 mmHg. Aortic valve Vmax   measures 3.12 m/s.   6. The inferior vena cava is normal in size with greater than 50%  respiratory variability, suggesting right atrial pressure of 3 mmHg.   Laboratory Data: High Sensitivity Troponin:   Recent Labs  Lab 09/18/24 1035 09/18/24 1232  TROPONINIHS 14 14     Chemistry Recent Labs  Lab 09/13/24 1414 09/18/24 1035 09/19/24 0600  NA 132* 138 138  K 4.8 4.0 3.9  CL 106 106 108  CO2 21* 21* 23  GLUCOSE 104* 102* 87  BUN 42* 34* 29*  CREATININE 2.34* 2.12* 2.10*  CALCIUM  8.5* 8.5* 8.1*  MG  --  2.3  --   GFRNONAA 26* 30* 30*  ANIONGAP 5 11 7     Recent Labs  Lab 09/13/24 1414 09/18/24 1035  PROT 6.5 6.5  ALBUMIN  3.4* 3.3*  AST 16 21  ALT 9 10  ALKPHOS 55 47  BILITOT 0.8 0.6   Lipids No results for input(s): CHOL, TRIG, HDL, LABVLDL, LDLCALC, CHOLHDL in the last 168 hours.  Hematology Recent Labs  Lab 09/13/24 1316 09/18/24 1035 09/19/24 0600  WBC 4.5 4.0 4.7  RBC 2.73* 2.87* 2.69*  HGB 8.6* 9.0* 8.5*  HCT 26.0* 28.4* 26.3*  MCV 95.2 99.0 97.8  MCH 31.5 31.4 31.6  MCHC 33.1 31.7 32.3  RDW 15.0 15.2 15.1  PLT 166 165 145*   Thyroid   Recent  Labs  Lab 09/18/24 1035  TSH 4.312    BNPNo results for input(s): BNP, PROBNP in the last 168 hours.  DDimer No results for input(s): DDIMER in the last 168 hours.  Radiology/Studies:  CT ABDOMEN PELVIS WO CONTRAST IMPRESSION: 1. No acute findings in the abdomen or pelvis. 2. Cholelithiasis. 3. Moderate-sized right inguinal hernia containing nondilated bowel, unchanged. 4. Moderately distended urinary bladder with indwelling suprapubic catheter. 5. Bilateral renal cysts, largest measuring 6.6 cm on the left and 4.9 cm on the right. 6. Prostatomegaly. 7. Colonic diverticulosis. Electronically signed by: Greig Pique MD 09/18/2024 03:19 PM EDT RP Workstation:  [id number]   DG Chest Portable 1 View Result Date: 09/18/2024 IMPRESSION: 1. No acute cardiopulmonary disease. 2. Postsurgical changes related to prior coronary artery bypass grafting and aortic valve repair. Electronically signed by: Lynwood Seip MD 09/18/2024 12:08 PM EDT RP Workstation: [id number]   CT Head Wo Contrast Result Date: 09/18/2024 IMPRESSION: 1. No acute intracranial abnormality. 2. Mild chronic small vessel ischemic disease. Small chronic right parietal infarct. Electronically signed by: Dasie Hamburg MD 09/18/2024 11:36 AM EDT RP Workstation: [id number]   Assessment and Plan:  Syncope - Presented for syncopal episode in the setting of UTI - Recent echo with preserved EF.  He is s/p AVR with mean gradient 20 mmHg at the aortic valve - Patient reports taking his a.m. medications on an empty stomach which could have contributed to presentation.  Suspect possible orthostasis vs vasovagal episode given back pain prior.  BP hypotensive on arrival, although orthostatic vital signs done later in the ED were negative. - Recommend observing on telemetry with live Zio monitor on discharge to assess for arrhythmia or pause  Urinary tract infection - UA positive for infection - Antibiotics per IM  Hypertension - BP low on admission, now trending up - Can resume PTA antihypertensives    For questions or updates, please contact [facility] Please consult [email or web address] for contact info under      Signed, Lesley LITTIE Maffucci, PA-C  09/19/2024 11:49 AM

## 2024-09-19 NOTE — ED Notes (Signed)
 Pt disconnected from monitor to use the toilet in room.

## 2024-09-19 NOTE — ED Notes (Signed)
 Pt BP noted to be 202/80 at this time. Pt reports that he takes BP meds during the day but at home if his BP is elevated at night he will take another BP pill. Dr. Lawence made aware via secure chat, PRN BP orders to be placed.

## 2024-09-20 DIAGNOSIS — I25118 Atherosclerotic heart disease of native coronary artery with other forms of angina pectoris: Secondary | ICD-10-CM | POA: Diagnosis not present

## 2024-09-20 DIAGNOSIS — R55 Syncope and collapse: Secondary | ICD-10-CM | POA: Diagnosis not present

## 2024-09-20 DIAGNOSIS — R001 Bradycardia, unspecified: Secondary | ICD-10-CM | POA: Diagnosis not present

## 2024-09-20 DIAGNOSIS — T83511A Infection and inflammatory reaction due to indwelling urethral catheter, initial encounter: Secondary | ICD-10-CM | POA: Diagnosis not present

## 2024-09-20 LAB — BASIC METABOLIC PANEL WITH GFR
Anion gap: 11 (ref 5–15)
BUN: 30 mg/dL — ABNORMAL HIGH (ref 8–23)
CO2: 21 mmol/L — ABNORMAL LOW (ref 22–32)
Calcium: 8.4 mg/dL — ABNORMAL LOW (ref 8.9–10.3)
Chloride: 107 mmol/L (ref 98–111)
Creatinine, Ser: 1.86 mg/dL — ABNORMAL HIGH (ref 0.61–1.24)
GFR, Estimated: 35 mL/min — ABNORMAL LOW (ref >60)
Glucose, Bld: 86 mg/dL (ref 70–99)
Potassium: 3.8 mmol/L (ref 3.5–5.1)
Sodium: 139 mmol/L (ref 135–145)

## 2024-09-20 LAB — CBC
HCT: 29 % — ABNORMAL LOW (ref 39.0–52.0)
Hemoglobin: 9.7 g/dL — ABNORMAL LOW (ref 13.0–17.0)
MCH: 31.9 pg (ref 26.0–34.0)
MCHC: 33.4 g/dL (ref 30.0–36.0)
MCV: 95.4 fL (ref 80.0–100.0)
Platelets: 157 K/uL (ref 150–400)
RBC: 3.04 MIL/uL — ABNORMAL LOW (ref 4.22–5.81)
RDW: 14.8 % (ref 11.5–15.5)
WBC: 5.4 K/uL (ref 4.0–10.5)
nRBC: 0 % (ref 0.0–0.2)

## 2024-09-20 LAB — MAGNESIUM: Magnesium: 2 mg/dL (ref 1.7–2.4)

## 2024-09-20 MED ORDER — HYDRALAZINE HCL 50 MG PO TABS: 50.0000 mg | ORAL_TABLET | Freq: Two times a day (BID) | ORAL | Status: DC | PRN

## 2024-09-20 MED ORDER — LISINOPRIL 5 MG PO TABS: 10.0000 mg | ORAL_TABLET | Freq: Every day | ORAL | Status: AC

## 2024-09-20 MED ORDER — LISINOPRIL 10 MG PO TABS: 10.0000 mg | ORAL_TABLET | Freq: Every day | ORAL | Status: DC

## 2024-09-20 MED ORDER — AMLODIPINE BESYLATE 5 MG PO TABS: 5.0000 mg | ORAL_TABLET | Freq: Two times a day (BID) | ORAL | Status: AC

## 2024-09-20 MED ORDER — POLYETHYLENE GLYCOL 3350 17 G PO PACK: 17.0000 g | PACK | Freq: Every day | ORAL | Status: AC

## 2024-09-20 NOTE — ED Notes (Signed)
 Physical therapy and the ICU charge nurse have been in with the pt. Unable to complete med pass at this time.

## 2024-09-20 NOTE — Discharge Instructions (Signed)
 Two of your medications have been changes.    Norvasc  (amlodipine ) was changed to one 5 mg tablet in the morning and one 5mg  tablet at night.   Your Lisinopril  (zestril ) was increased to 10mg  every day  Please continue to take your blood pressures daily and keep a record for your primary care physician. If your systolic blood pressure or the TOP number is below 100 please call your primary care physician and do NOT take your blood pressure medicines.

## 2024-09-20 NOTE — Progress Notes (Signed)
 Suprapubic catheter exchanged per orders with no difficulties. Previous catheter was 16 fr and when aspirated balloon had 7 mL in it. New catheter 16 fr with 10 mL in balloon.

## 2024-09-20 NOTE — Progress Notes (Signed)
 Progress Note  Patient Name: Rodney Wiley Date of Encounter: 09/20/2024  Primary Cardiologist: End  Subjective   No symptoms of chest pain, dyspnea, palpitations, dizziness, presyncope, or syncope.  No further bradycardic rates on telemetry.  No evidence of prolonged pauses, high-grade AV block, or malignant arrhythmia on telemetry.  Blood pressure in the 160s to 170s mmHg systolic.  Reports back pain is improving.  Inpatient Medications    Scheduled Meds:  amLODipine   5 mg Oral BID   aspirin  EC  81 mg Oral Daily   atorvastatin   40 mg Oral Daily   cholecalciferol  1,000 Units Oral Daily   enoxaparin  (LOVENOX ) injection  30 mg Subcutaneous Q24H   isosorbide  mononitrate  30 mg Oral Daily   lisinopril   5 mg Oral Daily   pantoprazole   40 mg Oral Daily   tamsulosin   0.4 mg Oral Daily   Continuous Infusions:  PRN Meds: acetaminophen  **OR** acetaminophen , hydrALAZINE , senna-docusate   Vital Signs    Vitals:   09/20/24 0515 09/20/24 0530 09/20/24 0545 09/20/24 0835  BP: (!) 168/67 (!) 175/78 (!) 175/91 (!) 154/78  Pulse: 72 85 74 76  Resp: 20 19    Temp:      TempSrc:      SpO2: 97% 97% 100% 100%  Weight:      Height:        Intake/Output Summary (Last 24 hours) at 09/20/2024 0911 Last data filed at 09/20/2024 0859 Gross per 24 hour  Intake --  Output 3050 ml  Net -3050 ml   Filed Weights   09/18/24 1027  Weight: 63 kg    Telemetry    Sinus rhythm with rates in the 60s to 70s bpm without evidence of malignant arrhythmia, prolonged pauses, or high-grade AV block - Personally Reviewed  ECG    No new tracings- Personally Reviewed  Physical Exam   GEN: No acute distress.   Neck: No JVD. Cardiac: RRR, I/VI systolic murmur RUSB, no rubs, or gallops.  Respiratory: Clear to auscultation bilaterally.  GI: Soft, nontender, non-distended.   MS: No edema; No deformity. Neuro:  Alert and oriented x 3; Nonfocal.  Psych: Normal affect.  Labs     Chemistry Recent Labs  Lab 09/13/24 1414 09/18/24 1035 09/19/24 0600 09/20/24 0440  NA 132* 138 138 139  K 4.8 4.0 3.9 3.8  CL 106 106 108 107  CO2 21* 21* 23 21*  GLUCOSE 104* 102* 87 86  BUN 42* 34* 29* 30*  CREATININE 2.34* 2.12* 2.10* 1.86*  CALCIUM  8.5* 8.5* 8.1* 8.4*  PROT 6.5 6.5  --   --   ALBUMIN  3.4* 3.3*  --   --   AST 16 21  --   --   ALT 9 10  --   --   ALKPHOS 55 47  --   --   BILITOT 0.8 0.6  --   --   GFRNONAA 26* 30* 30* 35*  ANIONGAP 5 11 7 11      Hematology Recent Labs  Lab 09/18/24 1035 09/19/24 0600 09/20/24 0440  WBC 4.0 4.7 5.4  RBC 2.87* 2.69* 3.04*  HGB 9.0* 8.5* 9.7*  HCT 28.4* 26.3* 29.0*  MCV 99.0 97.8 95.4  MCH 31.4 31.6 31.9  MCHC 31.7 32.3 33.4  RDW 15.2 15.1 14.8  PLT 165 145* 157    Cardiac EnzymesNo results for input(s): TROPONINI in the last 168 hours. No results for input(s): TROPIPOC in the last 168 hours.   BNPNo results  for input(s): BNP, PROBNP in the last 168 hours.   DDimer No results for input(s): DDIMER in the last 168 hours.   Radiology    CT ABDOMEN PELVIS WO CONTRAST Result Date: 09/18/2024 IMPRESSION: 1. No acute findings in the abdomen or pelvis. 2. Cholelithiasis. 3. Moderate-sized right inguinal hernia containing nondilated bowel, unchanged. 4. Moderately distended urinary bladder with indwelling suprapubic catheter. 5. Bilateral renal cysts, largest measuring 6.6 cm on the left and 4.9 cm on the right. 6. Prostatomegaly. 7. Colonic diverticulosis. Electronically signed by: Greig Pique MD 09/18/2024 03:19 PM EDT RP Workstation: [id number]   DG Chest Portable 1 View Result Date: 09/18/2024 IMPRESSION: 1. No acute cardiopulmonary disease. 2. Postsurgical changes related to prior coronary artery bypass grafting and aortic valve repair. Electronically signed by: Lynwood Seip MD 09/18/2024 12:08 PM EDT RP Workstation: [id number]   CT Head Wo Contrast Result Date: 09/18/2024 IMPRESSION: 1. No acute  intracranial abnormality. 2. Mild chronic small vessel ischemic disease. Small chronic right parietal infarct. Electronically signed by: Dasie Hamburg MD 09/18/2024 11:36 AM EDT RP Workstation: [id number]    Cardiac Studies   2D echo 07/19/2024: 1. Left ventricular ejection fraction, by estimation, is 60 to 65%. The  left ventricle has normal function. The left ventricle has no regional  wall motion abnormalities. There is moderate left ventricular hypertrophy.  Left ventricular diastolic  parameters are indeterminate. The average left ventricular global  longitudinal strain is -13.5 %. The global longitudinal strain is  abnormal.   2. Right ventricular systolic function is normal. The right ventricular  size is normal.   3. Left atrial size was moderately dilated.   4. The mitral valve is normal in structure. Moderate mitral valve  regurgitation. No evidence of mitral stenosis.   5. The aortic valve has been repaired/replaced, edwards Sapien  bioprosthetic aortic valve 02/2018. Aortic valve regurgitation is not  visualized. Aortic valve area, by VTI measures 0.78 cm. Aortic valve mean  gradient measures 20.0 mmHg. Aortic valve Vmax   measures 3.12 m/s.   6. The inferior vena cava is normal in size with greater than 50%  respiratory variability, suggesting right atrial pressure of 3 mmHg.   Patient Profile     88 y.o. male with history of CAD s/p CABG in 2019, thoracic aortic aneurysm and aortic regurgitation s/p Bentall procedure with bioprosthetic aortic valve replacement at time of CABG, chronic HFpEF, SVT, frequent PVCs, resistant hypertension, CKD stage IV with possible left renal artery stenosis, anemia of chronic disease, and recurrent UTI who is being seen 09/19/2024 for the evaluation of syncope.  Assessment & Plan    1.  Syncope: - Felt to be multifactorial in the setting of UTI versus possible vagal episode in the setting of back pain, possibly exacerbated by taking  medications on an empty stomach - Initially noted to be bradycardic and hypotensive with rates in the 40s bpm with profound first-degree AV block - Cannot exclude arrhythmia, high-grade AV block, or prolonged pauses contributing - Not on AV nodal blocking medications as an outpatient, avoid - No further episodes of bradycardia on telemetry - No evidence of high-grade AV block, malignant arrhythmia, or prolonged pauses on telemetry - Recent echo with preserved LV systolic function and normal functioning bioprosthetic aortic valve - Monitor on telemetry while admitted - Recommend Zio patch at discharge  2.  CAD status post CABG without angina: - Never with chest pain - High-sensitivity troponin negative - No evidence of ACS - No indication for heparin  drip -  No plans for inpatient ischemic evaluation at this time - ASA 81 mg and atorvastatin  40 mg  3.  History of thoracic aortic aneurysm and aortic regurgitation status post Bentall procedure: - Functioning normally on recent echo - ASA - SBE prophylaxis for all dental procedures  4.  HFpEF: - Without acute exacerbation - Defer addition of MRA with underlying advanced renal dysfunction and SGLT2 inhibitor given urinary retention with indwelling Foley catheter  5.  SVT/frequent PVCs: - Quiescent - Not on AV nodal blocking medication  6.  CKD stage IV with anemia of chronic disease: - Renal function stable - Avoid nephrotoxic agents - Hemoglobin stable  7.  HTN: - BP improving to the 150s mmHg systolic - Will need some degree of permissive hypertension - Amlodipine  5 mg twice daily, Imdur  30 mg, lisinopril  10 mg       For questions or updates, please contact [facility] Please consult [email or web address] for contact info under Cardiology/STEMI.    Signed, Bernardino Bring, PA-C [facility] Pager: [phone] 09/20/2024, 9:11 AM

## 2024-09-20 NOTE — ED Notes (Addendum)
 Assisted pt to the side of the bed and set up breakfast tray and emptied foley bag. Pt has no needs at this time

## 2024-09-20 NOTE — Evaluation (Signed)
 Physical Therapy Evaluation Patient Details Name: Rodney Wiley MRN: [id number] DOB: November 19, 1936 Today's Date: 09/20/2024  History of Present Illness  Patient is a 88 year old male with syncopal episode at the urology clinic. PMH: HTN, BPH s/p suprapubic cath, HLD, CAD s/p CABG, aortic stenosis.  Clinical Impression  Patient is agreeable to PT evaluation. He reports he lives alone and has intermittent assistance from family with driving to doctor appointments. He uses a cane, rolling walker, or wheelchair for mobility at baseline, depending on how bad his left chronic knee pain is at the time.  Today the patient was seated on bed eating breakfast on arrival to room. He reports feeling overall mildly weaker than baseline with functional mobility. Patient walked in hallway with rolling walker with no loss of balance. No dizziness reported with any activity today. He required no more than CGA for all mobility. Recommend to continue PT while in the hospital to maximize independence and decrease caregiver burden.       If plan is discharge home, recommend the following: Assist for transportation;Help with stairs or ramp for entrance;Assistance with cooking/housework   Can travel by private vehicle        Equipment Recommendations None recommended by PT  Recommendations for Other Services       Functional Status Assessment Patient has had a recent decline in their functional status and demonstrates the ability to make significant improvements in function in a reasonable and predictable amount of time.     Precautions / Restrictions Precautions Precautions: Fall Recall of Precautions/Restrictions: Intact Restrictions Weight Bearing Restrictions Per Provider Order: No      Mobility  Bed Mobility               General bed mobility comments: not assessed as patient sitting up on arrival and post session    Transfers Overall transfer level: Needs assistance Equipment used:  Rolling walker (2 wheels) Transfers: Sit to/from Stand Sit to Stand: Contact guard assist, Supervision           General transfer comment: CGA for initial stand from bed. supervision for stand from bed side commode    Ambulation/Gait Ambulation/Gait assistance: Contact guard assist, Supervision Gait Distance (Feet): 120 Feet Assistive device: Rolling walker (2 wheels) Gait Pattern/deviations: Step-through pattern, Decreased stride length, Decreased stance time - left Gait velocity: decreased     General Gait Details: mild decreased stance time LLE. patient ambulated in hallway with no dizziness reported. he reports feeling mildly weaker than is baseline. encouraged rolling walker for support at this time for safety and fall prevention here and in home setting  Stairs            Wheelchair Mobility     Tilt Bed    Modified Rankin (Stroke Patients Only)       Balance Overall balance assessment: Needs assistance, History of Falls Sitting-balance support: Feet supported Sitting balance-Leahy Scale: Good     Standing balance support: Bilateral upper extremity supported Standing balance-Leahy Scale: Fair                               Pertinent Vitals/Pain      Home Living Family/patient expects to be discharged to:: Private residence Living Arrangements: Alone Available Help at Discharge: Family;Available PRN/intermittently Type of Home: House Home Access: Stairs to enter   Entrance Stairs-Number of Steps: 2 (to get into the kitchen from outside)   Home Layout: One  level Home Equipment: Cane - single point;Wheelchair - Forensic psychologist (2 wheels) Additional Comments: family drives to appointments as needed. spoke with daughter-in-law on the phone who has encouraged rehab in the past after [facility] hospital stay. she also reports a fall not long ago    Prior Function Prior Level of Function : Independent/Modified Independent              Mobility Comments: using cane vs walker vs wheelchair depending on how bad the chronic L leg pain is       Extremity/Trunk Assessment   Upper Extremity Assessment Upper Extremity Assessment: Overall WFL for tasks assessed    Lower Extremity Assessment Lower Extremity Assessment: Generalized weakness;LLE deficits/detail LLE Deficits / Details: L knee wrapped with ace wrap. he reports chronic knee pain and need for knee replacement. ROM is grossly intact       Communication   Communication Communication: No apparent difficulties    Cognition Arousal: Alert Behavior During Therapy: WFL for tasks assessed/performed   PT - Cognitive impairments: No apparent impairments                         Following commands: Intact       Cueing Cueing Techniques: Verbal cues     General Comments General comments (skin integrity, edema, etc.): no dizziness reported with mobility today    Exercises     Assessment/Plan    PT Assessment Patient needs continued PT services  PT Problem List Decreased strength;Decreased range of motion;Decreased activity tolerance;Decreased balance;Decreased mobility;Decreased safety awareness       PT Treatment Interventions DME instruction;Gait training;Stair training;Therapeutic activities;Functional mobility training;Therapeutic exercise;Balance training;Neuromuscular re-education;Patient/family education    PT Goals (Current goals can be found in the Care Plan section)  Acute Rehab PT Goals Patient Stated Goal: to return home PT Goal Formulation: With patient Time For Goal Achievement: 10/04/24 Potential to Achieve Goals: Good    Frequency Min 2X/week     Co-evaluation               AM-PAC PT 6 Clicks Mobility  Outcome Measure Help needed turning from your back to your side while in a flat bed without using bedrails?: None Help needed moving from lying on your back to sitting on the side of a flat bed without using  bedrails?: None Help needed moving to and from a bed to a chair (including a wheelchair)?: A Little Help needed standing up from a chair using your arms (e.g., wheelchair or bedside chair)?: A Little Help needed to walk in hospital room?: A Little Help needed climbing 3-5 steps with a railing? : A Little 6 Click Score: 20    End of Session   Activity Tolerance: Patient tolerated treatment well Patient left: in chair;with call bell/phone within reach Nurse Communication: Mobility status PT Visit Diagnosis: Muscle weakness (generalized) (M62.81);Unsteadiness on feet (R26.81)    Time: 9143-9059 PT Time Calculation (min) (ACUTE ONLY): 44 min   Charges:   PT Evaluation $PT Eval Moderate Complexity: 1 Mod PT Treatments $Therapeutic Activity: 8-22 mins PT General Charges $$ ACUTE PT VISIT: 1 Visit         Randine Essex, PT, MPT  Randine LULLA Essex 09/20/2024, 10:55 AM

## 2024-09-20 NOTE — ED Notes (Signed)
 Pt was able to use bedside commode and have a bowel movement.

## 2024-09-21 ENCOUNTER — Telehealth: Payer: Self-pay | Admitting: Emergency Medicine

## 2024-09-21 ENCOUNTER — Ambulatory Visit: Attending: Cardiovascular Disease

## 2024-09-21 DIAGNOSIS — R55 Syncope and collapse: Secondary | ICD-10-CM

## 2024-09-21 NOTE — Telephone Encounter (Signed)
 Received the following message from Dr. Gollan.  Can we send him a 2-week live ZIO monitor for syncope in the mail to his house? He was in the hospital, left yesterday   Called patient. No answer and unable to leave message. Called and left message per DPR for daughter. Asked to call back with any questions. Order placed for Zio.

## 2024-09-23 NOTE — Discharge Summary (Signed)
 Physician Discharge Summary   Patient: Rodney Wiley MRN: [id number] DOB: 01-11-1936  Admit date:     09/18/2024  Discharge date: 09/20/2024  Discharge Physician: Alban Pepper   PCP: Donal Channing SQUIBB, FNP   Recommendations at discharge:    Repeat BMP in ~1 week to ensure creatiine stable.  Need iron  supplementation and iron  labs monitored outpatient. TSAT 15% 08/2024 labs.   Discharge Diagnoses: Principal Problem:   Syncope Active Problems:   Coronary artery disease of native artery of native heart with stable angina pectoris   Bradycardia   Urinary tract infection associated with indwelling urethral catheter  Resolved Problems:   * No resolved hospital problems. [facility] Course: Per H&P HPI:    Patient is a 88 year old male with past medical history of hypertension, BPH status post suprapubic cath, hyperlipidemia, CAD status post CABG, HFpEF, aortic stenosis status post replacement with recent echo showing gradient at 20 mmHg presenting to the ED after a syncopal episode at the urology clinic.  Rapid response was called and patient was brought to the ED for further evaluation.     Patient states he broke out in a sweat and then nursing staff came and checked on him and he went out. He never felt like he was going to pass out and has had similar episodes about 5 this year but never passed out in his life. Most of these spells occur while he is sitting. Pt reports blurry vision, and flushing during this episode.      In the ED patient was hemodynamically stable.  Heart rate was in the 40s and patient has been known to be bradycardic.  Lab work showed stable chronic abnormalities and none that would explain patient's current presentation.  Patient was given IV fluids by EDP and started on cefepime given concern for UTI.  TRH was consulted to evaluate for admission to evaluate his syncopal episode.   Assessment and Plan: Syncope  Unclear etiology. Possibly vasovagal v  orthostatic syncope v arrythmia.  Patient noted to have taking his blood pressure medications prior to his urology appointment without any food or liquids.  He did have some bradycardia but this improved throughout his hospitalization. His initial EKG showed HR in the 40s with profound 1degreee AV block. Patient will have ZIO monitor sent to him and will follow up with his outpatient cardiologist.   Possible UTI  Was to be treated for UTI outpatient given PUBS, but patient did not take antibiotics. No fever or leukocytosis.  She did have some back pain that resolved by discharge.  Urine culture 6 days ago with multiple species.  UA collected this hospitalization.  received 1x dose of cefepime in the ED. His suprapubic catheter was exchanged and he will complete the course of antibiotics prescribed outpatient.   HTN Blood pressure low on arrival. Then became elevated. His amlodipine  was increased and he was continued on his home medications.    Normocytic anemia  Iron  deficiency  Recent hospitalization for GI bleed  Iron /TIBC/Ferritin/ %Sat    Component Value Date/Time   IRON  38 (L) 08/13/2024 1120   TIBC 262 08/13/2024 1120   FERRITIN 95 08/13/2024 1120   IRONPCTSAT 15 (L) 08/13/2024 1120    Hemoglobin stable. Mild iron  deficiency. Will need iron  supplementation started outpatient.   CKD3b Unclear BL sCr. ~2. Patient at baseline on discharge. SABRA    CAD s/p CABG HLD  On ASA and statin   HFpEF  Appears euvolemic. PRN torsemide  resumed on  discharge.   AS s/p AVR TTE with gradient     BPH  S/p suprapubic cathter      Consultants: Cardiology Procedures performed: None Disposition: Home Diet recommendation:  Regular diet DISCHARGE MEDICATION: Allergies as of 09/20/2024   No Known Allergies      Medication List     STOP taking these medications    oxyCODONE  5 MG immediate release tablet Commonly known as: Oxy IR/ROXICODONE        TAKE these  medications    amLODipine  5 MG tablet Commonly known as: NORVASC  Take 1 tablet (5 mg total) by mouth 2 (two) times daily. What changed: when to take this   aspirin  EC 81 MG tablet Take 81 mg by mouth daily.   atorvastatin  40 MG tablet Commonly known as: LIPITOR Take 1 tablet (40 mg total) by mouth daily.   cholecalciferol 25 MCG (1000 UNIT) tablet Commonly known as: VITAMIN D3 Take 1,000 Units by mouth daily.   isosorbide  mononitrate 30 MG 24 hr tablet Commonly known as: IMDUR  Take 1 tablet (30 mg total) by mouth daily.   lisinopril  5 MG tablet Commonly known as: ZESTRIL  Take 2 tablets (10 mg total) by mouth daily.   pantoprazole  40 MG tablet Commonly known as: Protonix  Take 1 tablet (40 mg total) by mouth daily.   polyethylene glycol 17 g packet Commonly known as: MiraLax Take 17 g by mouth daily.   tamsulosin  0.4 MG Caps capsule Commonly known as: FLOMAX  Take 1 capsule (0.4 mg total) by mouth daily.   torsemide  10 MG tablet Commonly known as: DEMADEX  TAKE 1 TABLET BY MOUTH EVERY DAY AS NEEDED   traMADol  50 MG tablet Commonly known as: ULTRAM  Take 50 mg by mouth every 6 (six) hours as needed.       ASK your doctor about these medications    cefUROXime  250 MG tablet Commonly known as: CEFTIN  Take 1 tablet (250 mg total) by mouth 2 (two) times daily with a meal for 7 days. Ask about: Should I take this medication?        Follow-up Information     Donal Channing SQUIBB, FNP. Schedule an appointment as soon as possible for a visit in 1 week(s).   Specialty: Family Medicine Contact information: [address] [place] [address] [phone]         Mady Bruckner, MD. Schedule an appointment as soon as possible for a visit in 1 week(s).   Specialty: Cardiology Contact information: [address] [place] [address] [phone]                Discharge Exam: Fredricka Weights   09/18/24 1027  Weight: 63 kg   Physical Exam   Constitutional: In no distress.  Cardiovascular: Normal rate, regular rhythm. No lower extremity edema  Pulmonary: Non labored breathing on room air, no wheezing or rales.   Abdominal: Soft. Non distended and non tender Musculoskeletal: Normal range of motion.     Neurological: Alert and oriented to person, place, and time. Non focal  Skin: Skin is warm and dry.    Condition at discharge: good  The results of significant diagnostics from this hospitalization (including imaging, microbiology, ancillary and laboratory) are listed below for reference.   Imaging Studies: CT ABDOMEN PELVIS WO CONTRAST Result Date: 09/18/2024 EXAM: CT ABDOMEN AND PELVIS WITHOUT CONTRAST 09/18/2024 03:03:08 PM TECHNIQUE: CT of the abdomen and pelvis was performed without the administration of intravenous contrast. Multiplanar reformatted images are provided  for review. Automated exposure control, iterative reconstruction, and/or weight-based adjustment of the mA/kV was utilized to reduce the radiation dose to as low as reasonably achievable. COMPARISON: CT abdomen and pelvis 08/21/2023. CLINICAL HISTORY: Abdominal pain, acute, nonlocalized; back pain, indwelling cath, concern for UTI/obstruction. FINDINGS: LOWER CHEST: There are calcified granulomas in the left lung base. LIVER: The liver is unremarkable. GALLBLADDER AND BILE DUCTS: Gallstones are present. No biliary ductal dilatation. SPLEEN: No acute abnormality. PANCREAS: No acute abnormality. ADRENAL GLANDS: No acute abnormality. KIDNEYS, URETERS AND BLADDER: Suprapubic foley catheter is present. The bladder is moderately distended despite catheter placement. No surrounding inflammation. Bilateral renal cysts are present. The largest cyst on the right is in the inferior pole measuring 4.9 cm. The largest cyst in the inferior pole of the left kidney measures up to 6.6 cm. There is no urinary tract calculus or hydronephrosis. No perinephric or periureteral stranding.  Per consensus, no follow-up is needed for simple Bosniak type 1 and 2 renal cysts, unless the patient has a malignancy history or risk factors. GI AND BOWEL: Stomach demonstrates no acute abnormality. There is sigmoid and descending colon diverticulosis. There is no bowel obstruction. The appendix appears normal. PERITONEUM AND RETROPERITONEUM: No ascites. No free air. VASCULATURE: Extensive atherosclerotic calcifications of the aorta, iliac arteries and branch vessels. LYMPH NODES: No lymphadenopathy. REPRODUCTIVE ORGANS: Prostate gland is enlarged. BONES AND SOFT TISSUES: Multilevel degenerative changes of the spine. Moderate-sized right inguinal hernia contains nondilated bowel, unchanged. IMPRESSION: 1. No acute findings in the abdomen or pelvis. 2. Cholelithiasis. 3. Moderate-sized right inguinal hernia containing nondilated bowel, unchanged. 4. Moderately distended urinary bladder with indwelling suprapubic catheter. 5. Bilateral renal cysts, largest measuring 6.6 cm on the left and 4.9 cm on the right. 6. Prostatomegaly. 7. Colonic diverticulosis. Electronically signed by: Greig Pique MD 09/18/2024 03:19 PM EDT RP Workstation: [id number]   DG Chest Portable 1 View Result Date: 09/18/2024 EXAM: 1 VIEW XRAY OF THE CHEST 09/18/2024 11:08:00 AM COMPARISON: 08/23/2023 CLINICAL HISTORY: Syncope. Pt to ED after rapid response was called on pt in the urology clinic. Pt was seeing urology to be evaluated for UTI. Pt was sitting in lobby talking to visitor and had syncopal episode. When rapid response team arrived pt was alert and oriented. Pt was clammy. Urology clinic reported that pt did not hit his head. Pt has a urinary catheter in place, pt has purple bag syndrome. FINDINGS: LUNGS AND PLEURA: No focal pulmonary opacity. No pulmonary edema. No pleural effusion. No pneumothorax. HEART AND MEDIASTINUM: Stable cardiomediastinal silhouette. Status post coronary artery bypass graft and aortic valve repair. BONES  AND SOFT TISSUES: No acute osseous abnormality. IMPRESSION: 1. No acute cardiopulmonary disease. 2. Postsurgical changes related to prior coronary artery bypass grafting and aortic valve repair. Electronically signed by: Lynwood Seip MD 09/18/2024 12:08 PM EDT RP Workstation: [id number]   CT Head Wo Contrast Result Date: 09/18/2024 EXAM: CT HEAD WITHOUT CONTRAST 09/18/2024 11:18:00 AM TECHNIQUE: CT of the head was performed without the administration of intravenous contrast. Automated exposure control, iterative reconstruction, and/or weight based adjustment of the mA/kV was utilized to reduce the radiation dose to as low as reasonably achievable. COMPARISON: CT head 07/26/2023. CLINICAL HISTORY: Syncope/presyncope, cerebrovascular cause suspected. Patient to ED after rapid response was called in urology clinic. FINDINGS: BRAIN AND VENTRICLES: There is no evidence of an acute infarct, intracranial hemorrhage, mass, midline shift, hydrocephalus, or acute extra-axial fluid collection. There is mild cerebral atrophy. A small chronic right parietal infarct is unchanged. Patchy hypodensities  elsewhere in the cerebral white matter bilaterally are unchanged and nonspecific but compatible with mild chronic small vessel ischemic disease. A small amount of asymmetric low density extra axial fluid over the left frontal convexity measuring up to 3 mm in thickness is unchanged and may be secondary to cerebral atrophy or reflective of a tiny subdural hygroma without mass effect. Calcified atherosclerosis at the skull base. ORBITS: Bilateral cataract extraction. SINUSES: No acute abnormality. SOFT TISSUES AND SKULL: No acute soft tissue abnormality. No skull fracture. IMPRESSION: 1. No acute intracranial abnormality. 2. Mild chronic small vessel ischemic disease. Small chronic right parietal infarct. Electronically signed by: Dasie Hamburg MD 09/18/2024 11:36 AM EDT RP Workstation: [id number]    Microbiology: Results for  orders placed or performed in visit on 09/13/24  Urine Culture     Status: Abnormal   Collection Time: 09/13/24  2:37 PM   Specimen: Urine, Clean Catch  Result Value Ref Range Status   Specimen Description   Final    URINE, CLEAN CATCH Performed at [facility], [address] [place], [place] [address]    Special Requests   Final    NONE Performed at [facility], [address] [place], [place] [address]    Culture MULTIPLE SPECIES PRESENT, SUGGEST RECOLLECTION (A)  Final   Report Status 09/14/2024 FINAL  Final    Labs: CBC: Recent Labs  Lab 09/18/24 1035 09/19/24 0600 09/20/24 0440  WBC 4.0 4.7 5.4  NEUTROABS 2.5  --   --   HGB 9.0* 8.5* 9.7*  HCT 28.4* 26.3* 29.0*  MCV 99.0 97.8 95.4  PLT 165 145* 157   Basic Metabolic Panel: Recent Labs  Lab 09/18/24 1035 09/19/24 0600 09/20/24 0440  NA 138 138 139  K 4.0 3.9 3.8  CL 106 108 107  CO2 21* 23 21*  GLUCOSE 102* 87 86  BUN 34* 29* 30*  CREATININE 2.12* 2.10* 1.86*  CALCIUM  8.5* 8.1* 8.4*  MG 2.3  --  2.0   Liver Function Tests: Recent Labs  Lab 09/18/24 1035  AST 21  ALT 10  ALKPHOS 47  BILITOT 0.6  PROT 6.5  ALBUMIN  3.3*   CBG: Recent Labs  Lab 09/18/24 1033  GLUCAP 99    Discharge time spent: greater than 30 minutes.  Signed: Alban Pepper, MD [facility] 09/23/2024

## 2024-09-24 ENCOUNTER — Inpatient Hospital Stay

## 2024-09-24 ENCOUNTER — Encounter: Payer: Self-pay | Admitting: Oncology

## 2024-10-02 ENCOUNTER — Telehealth: Payer: Self-pay | Admitting: Cardiovascular Disease

## 2024-10-02 NOTE — Telephone Encounter (Signed)
 Caller Donavon) called from [employer] regarding critical zio monitor result. Patient experienced a flutter this morning at a rate of 58 bpm at 0858. Will call patient to follow up on any symptoms.

## 2024-10-02 NOTE — Telephone Encounter (Signed)
 Carla with [employer] calling to report critical EKG

## 2024-10-08 ENCOUNTER — Inpatient Hospital Stay

## 2024-10-08 ENCOUNTER — Inpatient Hospital Stay: Attending: Oncology

## 2024-10-17 ENCOUNTER — Ambulatory Visit: Admitting: Cardiology

## 2024-10-18 ENCOUNTER — Telehealth: Payer: Self-pay | Admitting: Internal Medicine

## 2024-10-18 NOTE — Telephone Encounter (Signed)
 Called PT Rodney Wiley for further information Rodney Wiley states the patient is having dizzy spells with low heart rates 48 on pulse ox, 52 manually - she noted a small extra beat, patient will get dizzy and sweaty and have to sit down to recover, Rodney Wiley noted that the patient has had 2 falls recently and feeling of a thump in his chest while lying down.   He is being d/c'd from [facility] as he is still driving. Rodney Wiley noted that he was driving to his PCP, but missed appointment. She recommended contacting his DIL if patient needs to be seen in office  Please advise

## 2024-10-18 NOTE — Telephone Encounter (Signed)
 STAT if HR is under 50 or over 120 (normal HR is 60-100 beats per minute)  What is your heart rate? With pulse ox it was 48, but 52 when she checked it manually with an irregular rhythm   Do you have a log of your heart rate readings (document readings)? Heart range 48-52  Do you have any other symptoms? Angie stated during physical therapy patient was getting dizzy and get sweaty, had to sit down.  Patient said he feels like a thump in the middle on his chest at times.

## 2024-10-19 ENCOUNTER — Telehealth: Payer: Self-pay | Admitting: Cardiology

## 2024-10-19 NOTE — Telephone Encounter (Signed)
 Called 2x to schedule EP referral no VM mailbox is full

## 2024-10-19 NOTE — Telephone Encounter (Signed)
-----   Message from Nurse Jon SAUNDERS sent at 10/18/2024  4:23 PM EST ----- Regarding: EP visit referrals are in chart from June - please schedule

## 2024-10-22 ENCOUNTER — Inpatient Hospital Stay

## 2024-10-22 NOTE — Telephone Encounter (Signed)
Called patient, no answer and unable to leave message. °

## 2024-10-30 ENCOUNTER — Ambulatory Visit (INDEPENDENT_AMBULATORY_CARE_PROVIDER_SITE_OTHER): Admitting: Urology

## 2024-10-30 DIAGNOSIS — R339 Retention of urine, unspecified: Secondary | ICD-10-CM | POA: Diagnosis not present

## 2024-10-30 NOTE — Progress Notes (Signed)
 Suprapubic Cath Change  Patient is present today for a suprapubic catheter change due to urinary retention.  10 ml of water was drained from the balloon, a 16 FR foley cath was removed from the tract with out difficulty.  Site was cleaned and prepped in a sterile fashion with betadine.  A 16 FR foley cath was replaced into the tract no complications were noted. Urine return was noted, 10 ml of sterile water was inflated into the balloon and a Leg bag was attached for drainage.  Patient tolerated well. A night bag was given to patient and proper instruction was given on how to switch bags.    Performed by: Mathew Pinal, RN  Follow up: 11-27-2024

## 2024-11-05 ENCOUNTER — Inpatient Hospital Stay: Attending: Oncology

## 2024-11-05 ENCOUNTER — Inpatient Hospital Stay

## 2024-11-05 DIAGNOSIS — N184 Chronic kidney disease, stage 4 (severe): Secondary | ICD-10-CM | POA: Insufficient documentation

## 2024-11-05 DIAGNOSIS — I129 Hypertensive chronic kidney disease with stage 1 through stage 4 chronic kidney disease, or unspecified chronic kidney disease: Secondary | ICD-10-CM | POA: Insufficient documentation

## 2024-11-05 DIAGNOSIS — D631 Anemia in chronic kidney disease: Secondary | ICD-10-CM | POA: Insufficient documentation

## 2024-11-15 ENCOUNTER — Emergency Department

## 2024-11-15 ENCOUNTER — Other Ambulatory Visit: Payer: Self-pay

## 2024-11-15 ENCOUNTER — Observation Stay
Admission: EM | Admit: 2024-11-15 | Discharge: 2024-11-18 | Disposition: A | Source: Home / Self Care | Attending: Emergency Medicine | Admitting: Emergency Medicine

## 2024-11-15 DIAGNOSIS — R531 Weakness: Secondary | ICD-10-CM

## 2024-11-15 DIAGNOSIS — K922 Gastrointestinal hemorrhage, unspecified: Secondary | ICD-10-CM | POA: Diagnosis present

## 2024-11-15 DIAGNOSIS — I130 Hypertensive heart and chronic kidney disease with heart failure and stage 1 through stage 4 chronic kidney disease, or unspecified chronic kidney disease: Secondary | ICD-10-CM | POA: Diagnosis not present

## 2024-11-15 DIAGNOSIS — K644 Residual hemorrhoidal skin tags: Secondary | ICD-10-CM | POA: Diagnosis not present

## 2024-11-15 DIAGNOSIS — Z79899 Other long term (current) drug therapy: Secondary | ICD-10-CM | POA: Diagnosis not present

## 2024-11-15 DIAGNOSIS — Z951 Presence of aortocoronary bypass graft: Secondary | ICD-10-CM | POA: Diagnosis not present

## 2024-11-15 DIAGNOSIS — K640 First degree hemorrhoids: Secondary | ICD-10-CM | POA: Diagnosis not present

## 2024-11-15 DIAGNOSIS — W19XXXA Unspecified fall, initial encounter: Secondary | ICD-10-CM

## 2024-11-15 DIAGNOSIS — N3000 Acute cystitis without hematuria: Secondary | ICD-10-CM

## 2024-11-15 DIAGNOSIS — K921 Melena: Secondary | ICD-10-CM | POA: Diagnosis not present

## 2024-11-15 DIAGNOSIS — S51812A Laceration without foreign body of left forearm, initial encounter: Secondary | ICD-10-CM | POA: Diagnosis not present

## 2024-11-15 DIAGNOSIS — M25512 Pain in left shoulder: Secondary | ICD-10-CM

## 2024-11-15 DIAGNOSIS — Z7982 Long term (current) use of aspirin: Secondary | ICD-10-CM | POA: Diagnosis not present

## 2024-11-15 DIAGNOSIS — D122 Benign neoplasm of ascending colon: Secondary | ICD-10-CM | POA: Diagnosis not present

## 2024-11-15 DIAGNOSIS — E785 Hyperlipidemia, unspecified: Secondary | ICD-10-CM | POA: Diagnosis not present

## 2024-11-15 DIAGNOSIS — K5730 Diverticulosis of large intestine without perforation or abscess without bleeding: Secondary | ICD-10-CM | POA: Diagnosis not present

## 2024-11-15 DIAGNOSIS — M25562 Pain in left knee: Secondary | ICD-10-CM | POA: Diagnosis not present

## 2024-11-15 DIAGNOSIS — Z23 Encounter for immunization: Secondary | ICD-10-CM | POA: Diagnosis not present

## 2024-11-15 DIAGNOSIS — D62 Acute posthemorrhagic anemia: Secondary | ICD-10-CM | POA: Diagnosis not present

## 2024-11-15 DIAGNOSIS — D649 Anemia, unspecified: Secondary | ICD-10-CM

## 2024-11-15 DIAGNOSIS — K2091 Esophagitis, unspecified with bleeding: Secondary | ICD-10-CM | POA: Diagnosis present

## 2024-11-15 DIAGNOSIS — N183 Chronic kidney disease, stage 3 unspecified: Secondary | ICD-10-CM | POA: Diagnosis not present

## 2024-11-15 DIAGNOSIS — I5032 Chronic diastolic (congestive) heart failure: Secondary | ICD-10-CM | POA: Diagnosis not present

## 2024-11-15 DIAGNOSIS — I5030 Unspecified diastolic (congestive) heart failure: Secondary | ICD-10-CM | POA: Diagnosis not present

## 2024-11-15 DIAGNOSIS — N401 Enlarged prostate with lower urinary tract symptoms: Secondary | ICD-10-CM | POA: Diagnosis not present

## 2024-11-15 LAB — TROPONIN T, HIGH SENSITIVITY
Troponin T High Sensitivity: 108 ng/L (ref 0–19)
Troponin T High Sensitivity: 100 ng/L (ref 0–19)

## 2024-11-15 LAB — URINALYSIS, ROUTINE W REFLEX MICROSCOPIC
Bilirubin Urine: NEGATIVE
Glucose, UA: NEGATIVE mg/dL
Ketones, ur: NEGATIVE mg/dL
Nitrite: POSITIVE — AB
Protein, ur: 30 mg/dL — AB
Specific Gravity, Urine: 1.012 (ref 1.005–1.030)
WBC, UA: >50 WBC/hpf (ref 0–5)
pH: 5 (ref 5.0–8.0)

## 2024-11-15 LAB — COMPREHENSIVE METABOLIC PANEL WITH GFR
ALT: 7 U/L (ref 0–44)
AST: 16 U/L (ref 15–41)
Albumin: 3.4 g/dL — ABNORMAL LOW (ref 3.5–5.0)
Alkaline Phosphatase: 61 U/L (ref 38–126)
Anion gap: 12 (ref 5–15)
BUN: 39 mg/dL — ABNORMAL HIGH (ref 8–23)
CO2: 19 mmol/L — ABNORMAL LOW (ref 22–32)
Calcium: 8.3 mg/dL — ABNORMAL LOW (ref 8.9–10.3)
Chloride: 107 mmol/L (ref 98–111)
Creatinine, Ser: 1.79 mg/dL — ABNORMAL HIGH (ref 0.61–1.24)
GFR, Estimated: 36 mL/min — ABNORMAL LOW (ref >60)
Glucose, Bld: 121 mg/dL — ABNORMAL HIGH (ref 70–99)
Potassium: 4.6 mmol/L (ref 3.5–5.1)
Sodium: 138 mmol/L (ref 135–145)
Total Bilirubin: 0.3 mg/dL (ref 0.0–1.2)
Total Protein: 6 g/dL — ABNORMAL LOW (ref 6.5–8.1)

## 2024-11-15 LAB — CBC
HCT: 18.5 % — ABNORMAL LOW (ref 39.0–52.0)
Hemoglobin: 5.8 g/dL — ABNORMAL LOW (ref 13.0–17.0)
MCH: 31.7 pg (ref 26.0–34.0)
MCHC: 31.4 g/dL (ref 30.0–36.0)
MCV: 101.1 fL — ABNORMAL HIGH (ref 80.0–100.0)
Platelets: 187 K/uL (ref 150–400)
RBC: 1.83 MIL/uL — ABNORMAL LOW (ref 4.22–5.81)
RDW: 15.9 % — ABNORMAL HIGH (ref 11.5–15.5)
WBC: 5.4 K/uL (ref 4.0–10.5)
nRBC: 0 % (ref 0.0–0.2)

## 2024-11-15 LAB — CBG MONITORING, ED: Glucose-Capillary: 105 mg/dL — ABNORMAL HIGH (ref 70–99)

## 2024-11-15 LAB — PROTIME-INR
INR: 1.1 (ref 0.8–1.2)
Prothrombin Time: 15 s (ref 11.4–15.2)

## 2024-11-15 LAB — PRO BRAIN NATRIURETIC PEPTIDE: Pro Brain Natriuretic Peptide: 1643 pg/mL — ABNORMAL HIGH (ref <300.0)

## 2024-11-15 LAB — PREPARE RBC (CROSSMATCH)

## 2024-11-15 MED ORDER — ACETAMINOPHEN 650 MG RE SUPP: 650.0000 mg | Freq: Four times a day (QID) | RECTAL | Status: DC | PRN

## 2024-11-15 MED ORDER — LISINOPRIL 10 MG PO TABS
10.0000 mg | ORAL_TABLET | Freq: Every day | ORAL | Status: DC
  Administered 2024-11-15 – 2024-11-18 (×4): 10 mg via ORAL
  Filled 2024-11-15 (×4): qty 1

## 2024-11-15 MED ORDER — ONDANSETRON HCL 4 MG/2ML IJ SOLN: 4.0000 mg | Freq: Four times a day (QID) | INTRAMUSCULAR | Status: DC | PRN

## 2024-11-15 MED ORDER — BOOST / RESOURCE BREEZE PO LIQD CUSTOM
1.0000 | Freq: Three times a day (TID) | ORAL | Status: DC
  Administered 2024-11-16 – 2024-11-18 (×6): 1 via ORAL

## 2024-11-15 MED ORDER — ONDANSETRON HCL 4 MG PO TABS: 4.0000 mg | ORAL_TABLET | Freq: Four times a day (QID) | ORAL | Status: DC | PRN

## 2024-11-15 MED ORDER — ISOSORBIDE MONONITRATE ER 30 MG PO TB24
60.0000 mg | ORAL_TABLET | Freq: Every day | ORAL | Status: DC
  Administered 2024-11-15 – 2024-11-18 (×4): 60 mg via ORAL
  Filled 2024-11-15 (×4): qty 2

## 2024-11-15 MED ORDER — PANTOPRAZOLE SODIUM 40 MG IV SOLR
80.0000 mg | Freq: Once | INTRAVENOUS | Status: AC
  Administered 2024-11-15: 80 mg via INTRAVENOUS
  Filled 2024-11-15: qty 20

## 2024-11-15 MED ORDER — TETANUS-DIPHTH-ACELL PERTUSSIS 5-2-15.5 LF-MCG/0.5 IM SUSP
0.5000 mL | Freq: Once | INTRAMUSCULAR | Status: AC
  Administered 2024-11-15: 0.5 mL via INTRAMUSCULAR
  Filled 2024-11-15: qty 0.5

## 2024-11-15 MED ORDER — PANTOPRAZOLE SODIUM 40 MG IV SOLR
40.0000 mg | Freq: Two times a day (BID) | INTRAVENOUS | Status: DC
  Administered 2024-11-16 – 2024-11-18 (×6): 40 mg via INTRAVENOUS
  Filled 2024-11-15 (×7): qty 10

## 2024-11-15 MED ORDER — ACETAMINOPHEN 325 MG PO TABS
650.0000 mg | ORAL_TABLET | Freq: Four times a day (QID) | ORAL | Status: DC | PRN
  Administered 2024-11-15 – 2024-11-18 (×5): 650 mg via ORAL
  Filled 2024-11-15 (×5): qty 2

## 2024-11-15 MED ORDER — AMLODIPINE BESYLATE 5 MG PO TABS
5.0000 mg | ORAL_TABLET | Freq: Two times a day (BID) | ORAL | Status: DC
  Administered 2024-11-15 – 2024-11-18 (×6): 5 mg via ORAL
  Filled 2024-11-15 (×6): qty 1

## 2024-11-15 MED ORDER — SODIUM CHLORIDE 0.9 % IV SOLN
1.0000 g | Freq: Once | INTRAVENOUS | Status: AC
  Administered 2024-11-15: 1 g via INTRAVENOUS
  Filled 2024-11-15: qty 10

## 2024-11-15 MED ORDER — SODIUM CHLORIDE 0.9 % IV SOLN
10.0000 mL/h | Freq: Once | INTRAVENOUS | Status: AC
  Administered 2024-11-15: 10 mL/h via INTRAVENOUS

## 2024-11-15 NOTE — ED Triage Notes (Addendum)
 Pt arrives via POV with c/o weakness, black stools, and confusion since Friday. Per family, the confusion was sudden on Friday and had some blood loss in his stool earlier in the week last week and had some SOB while walking around. Pt has a hx of anemia. Pt is A&Ox4 during triage.   Pt also had a fall on Friday and endorses some leg & arm pain on the left side.  Pt has a suprapubic catheter in place.

## 2024-11-15 NOTE — Hospital Course (Addendum)
 Been having dark stools for the past two weeks.    Friday had dark bm and had a spell where he was dizzy syncopal episdo. Told his family at that time he was having Every couple of days.     Big BM Friday. No dark bowel movement or red bowel movements.   Small bowel movmeent yesterday. Still black.   Takes advil 2-3 weeks ago,  Advil dual action, took several weeks. Twice a week.   Stopped taking aspirin  since GI bleed in [place].     88 year old male with past medical history of hypertension, BPH status post suprapubic cath, hyperlipidemia, CAD status post CABG, HFpEF, aortic stenosis status post replacement with recent echo showing gradient at 20 mmHg presenting to the ED after a syncopal episode at the urology clinic. Rapid response was called and patient was brought to the ED for further evaluation.   He presents to the ED after having dark stools for the past 2 weeks.  He states that since his dark stool started there occurred every couple days and he will sometimes have bowel movements a day.  He had a large black bowel movement 6 days prior to admission and subsequently had a syncopal episode.  His last bowel movement was day prior to admission and this morning normal in color but continues to have black specks.  His family encouraged him to come to the ED since his syncopal episode 6 days ago-patient refused until today. Patient has also noted some general malaise and occasional mental confusion.   He denies BRBPR, chest pain, dyspnea, abdominal pain, fever, or chills.

## 2024-11-15 NOTE — ED Notes (Signed)
 This tech and Janeeta EDT assisted pt to the bathroom. Pt was assisted back to bed and repositioned. No other needs verbalized at this time. Fall bundle in place.

## 2024-11-15 NOTE — Progress Notes (Signed)
 Blood transfusion completed @ 1940

## 2024-11-15 NOTE — H&P (Signed)
 History and Physical    Patient: Rodney Wiley FMW:[id number] DOB: 23-Aug-1936 DOA: 11/15/2024 DOS: the patient was seen and examined on 11/15/2024 PCP: Rodney Channing SQUIBB, FNP  Patient coming from: Home  Chief Complaint:  Chief Complaint  Patient presents with   Weakness   Rectal Bleeding   Altered Mental Status   HPI: Rodney Wiley is a 88 y.o. male with medical history significant of AS s/p valve replacement, recent GI bleed (thought to be diverticular 08/2024), BPH s/p suprapubic catheter,  CKD stage 3b, and HTN.   He presents to the ED after having dark stools for the past 2 weeks.  He states that since his dark stool started there occurred every couple days and he will sometimes have bowel movements a day.  He had a large black bowel movement 6 days prior to admission and subsequently had a syncopal episode.  His last bowel movement was day prior to admission and this morning normal in color but continues to have black specks.  His family encouraged him to come to the ED since his syncopal episode 6 days ago-patient refused until today. Patient has also noted some general malaise and occasional mental confusion.   He states he last took his aspirin  08/2024 after hospitalization for LGIB. He takes Advil occasionally but last took this about a week ago.  He had a colonoscopy 08/16/2024 which showed multiple medium mouthed diverticula in the L colon and non bleeding external hemorrhoids. Upper endoscopy notable for large hiatal hernia, and few localized erosions with no bleeding or stagmata of recent bleeding in the gastric antrum.   He denies BRBPR, chest pain, dyspnea, abdominal pain, fever, or chills.   ED: Patient was noted to be hypotensive, NL HR, and 100% O2 sat on RA. Labs were notable for stably elevated scr, bicarb 19, and hgb 5.8. Xray of L knee, forearm and shoulder were obtained given fall 6 d ago and were negative for acute fracture.   He was transfused 2u of pRBCs.    Review of Systems: As mentioned in the history of present illness. All other systems reviewed and are negative. Past Medical History:  Diagnosis Date   Anemia    Aortic insufficiency    a. 02/2018 s/p bioprosthetic AVR; 04/2018 Echo: EF 50-55%, Gr2 DD, Ao bioprosthesis, mean grad , Ao root/Asc Ao nl in size, mild MR, mildly dil LA, nl RV fxn. Nl PASP.   Arthropathy    Ascending aortic aneurysm    a. 12/2016 MRA: 4.3cm @ sinus of valsalva, 4.9cm above sinotubular jxn; b. 02/2018 s/p biological Bentall and AVR.   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 02/2018 Cath: LAD 20ost, 40p, 40m, LCX 60p, OM2 60, OM3 85, RCA 10p/m w/ L->R and R->R collats;  b. 02/2018 CABG x 3: LIMA to LAD, SVG to OM2, SVG to PDA, EVH via right thigh.   CKD (chronic kidney disease), stage III (HCC)    Diverticulitis    Essential hypertension    GERD (gastroesophageal reflux disease)    Hemorrhoids    History of chicken pox    History of Helicobacter pylori infection 06/2007   Hyperlipidemia    Hypertension    Left renal artery stenosis    a. 01/2017 RA u/s: moderate L RAS.   Lower extremity edema    Nonrheumatic mitral (valve) insufficiency    a. 12/2016 Echo: mild to mod MR; b. 09/2017 TEE: mild to mod MR;  c. 04/2018 Echo: Mild MR.   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 s/p 21 mm Edwards Inspiris Resilia stented bovine pericardial tissue valve and 24 mm Gelweave Valsalva synthetic root conduit with reimplantation of left main coronary artery.   Past Surgical History:  Procedure Laterality Date   AORTIC VALVE REPAIR N/A 02/22/2018   Procedure: AORTIC VALVE REPLACEMENT -Biological Bentall aortic root replacement,;  Surgeon: Dusty Sudie DEL, MD;  Location: [facility] OR;  Service: Open Heart Surgery;  Laterality: N/A;   bypass surgery     CARDIAC CATHETERIZATION      01/16/2018   COLONOSCOPY  2011   by Dr. Ora with findings of diverticulosis   CORONARY ARTERY BYPASS GRAFT N/A 02/22/2018   Procedure: CORONARY ARTERY BYPASS GRAFTING (CABG) x three, using left internal mammary artery and right leg greater saphenous vein harvested endoscopically;  Surgeon: Dusty Sudie DEL, MD;  Location: [facility] OR;  Service: Open Heart Surgery;  Laterality: N/A;   CORONARY/GRAFT ANGIOGRAPHY N/A 01/16/2018   Procedure: CORONARY/GRAFT ANGIOGRAPHY;  Surgeon: Mady Bruckner, MD;  Location: [facility] INVASIVE CV LAB;  Service: Cardiovascular;  Laterality: N/A;   ELBOW SURGERY Left    hemorhoidectomy     IR BONE MARROW BIOPSY & ASPIRATION  01/30/2024   IR CATHETER TUBE CHANGE  07/30/2024   IR CYSTOSTOMY TUBE PLACEMENT/BLADDER ASPIRATION  05/22/2024   RIGHT HEART CATH N/A 01/16/2018   Procedure: RIGHT HEART CATH;  Surgeon: Mady Bruckner, MD;  Location: [facility] INVASIVE CV LAB;  Service: Cardiovascular;  Laterality: N/A;   SHOULDER ARTHROSCOPY WITH ROTATOR CUFF REPAIR Right    SHOULDER SURGERY Right    TEE WITHOUT CARDIOVERSION N/A 09/27/2017   Procedure: TRANSESOPHAGEAL ECHOCARDIOGRAM (TEE);  Surgeon: Maranda Leim DEL, MD;  Location: [facility] ENDOSCOPY;  Service: Cardiovascular;  Laterality: N/A;   TEE WITHOUT CARDIOVERSION N/A 02/22/2018   Procedure: TRANSESOPHAGEAL ECHOCARDIOGRAM (TEE);  Surgeon: Dusty Sudie DEL, MD;  Location: [facility] OR;  Service: Open Heart Surgery;  Laterality: N/A;   THORACIC AORTIC ANEURYSM REPAIR N/A 02/22/2018   Procedure: THORACIC ASCENDING ANEURYSM REPAIR (AAA) Resection of ascending aorta aneurysm;  Surgeon: Dusty Sudie DEL, MD;  Location: [facility] OR;  Service: Open Heart Surgery;  Laterality: N/A;   Social History:  reports that he has never smoked. He has never used smokeless tobacco. He reports that he does not drink alcohol  and does not use drugs.  Allergies[1]  Family History  Problem Relation Age of Onset   Hypertension Mother    Stroke Mother    Leukemia Father    Heart  attack Paternal Grandmother    Stroke Paternal Grandfather    Heart Problems Son 84   Heart Problems Son 9   Uterine cancer Sister    Prostate cancer Brother    Colon cancer Brother    Kidney failure Brother    Stroke Sister     Prior to Admission medications  Medication Sig Start Date End Date Taking? Authorizing Provider  amLODipine  (NORVASC ) 5 MG tablet Take 1 tablet (5 mg total) by mouth 2 (two) times daily. 09/20/24  Yes Franchot Novel, MD  aspirin  EC 81 MG tablet Take 81 mg by mouth daily.   Yes [provider]  atorvastatin  (LIPITOR) 40 MG tablet Take 1 tablet (40 mg total) by mouth daily. 05/18/24  Yes End, Bruckner, MD  cholecalciferol  (VITAMIN D3) 25 MCG (1000 UT) tablet Take 1,000 Units by mouth daily.  Yes [provider]  isosorbide  mononitrate (IMDUR ) 60 MG 24 hr tablet Take 60 mg by mouth daily. 11/05/24  Yes [provider]  lisinopril  (ZESTRIL ) 5 MG tablet Take 2 tablets (10 mg total) by mouth daily. 09/20/24  Yes Franchot Novel, MD  torsemide  (DEMADEX ) 10 MG tablet TAKE 1 TABLET BY MOUTH EVERY DAY AS NEEDED 07/11/23  Yes End, Lonni, MD  traMADol  (ULTRAM ) 50 MG tablet Take 50 mg by mouth every 6 (six) hours as needed. 07/15/23  Yes [provider]  pantoprazole  (PROTONIX ) 40 MG tablet Take 1 tablet (40 mg total) by mouth daily. 08/21/23 09/18/24  Dorinda Drue DASEN, MD  polyethylene glycol (MIRALAX ) 17 g packet Take 17 g by mouth daily. Patient not taking: Reported on 11/15/2024 09/20/24   Franchot Novel, MD  tamsulosin  (FLOMAX ) 0.4 MG CAPS capsule Take 1 capsule (0.4 mg total) by mouth daily. Patient not taking: Reported on 09/18/2024 01/05/24   Helon Clotilda LABOR, PA-C    Physical Exam: Vitals:   11/15/24 1350 11/15/24 1353 11/15/24 1408 11/15/24 1430  BP: (!) 144/67 (!) 144/67 (!) 163/69 (!) 163/72  Pulse:  61 (!) 59 (!) 59  Resp: 16 15 16    Temp: (!) 97.1 F (36.2 C) (!) 97.1 F (36.2 C) 98.1 F (36.7 C)   TempSrc:  Oral Oral Oral   SpO2: 100% 100% 98% 100%  Weight:      Height:       Physical Exam  Constitutional: In no distress.  Cardiovascular: Normal rate, regular rhythm. Systolic murmur present throughout precordium. No lower extremity edema  Pulmonary: Non labored breathing on room air, no wheezing or rales.   Abdominal: Soft. Non distended and non tender Musculoskeletal: Normal range of motion.     Neurological: Alert and oriented to person, place, and time. Non focal  Skin: Pale, warm and dry   Data Reviewed: {Tip this will not be part of the note when signed- Document your independent interpretation of telemetry tracing, EKG, lab, Radiology test or any other diagnostic tests. Add any new diagnostic test ordered today. (Optional):[id number]}    Latest Ref Rng & Units 11/15/2024   11:17 AM 09/20/2024    4:40 AM 09/19/2024    6:00 AM  CBC  WBC 4.0 - 10.5 K/uL 5.4  5.4  4.7   Hemoglobin 13.0 - 17.0 g/dL 5.8  9.7  8.5   Hematocrit 39.0 - 52.0 % 18.5  29.0  26.3   Platelets 150 - 400 K/uL 187  157  145      Assessment and Plan: Likely upper GIB       Advance Care Planning:   Code Status: Limited: Do not attempt resuscitation (DNR) -DNR-LIMITED -Do Not Intubate/DNI  Discussed with patient and his son at bedside  Consults: GI   Family Communication: Son at bedside   Severity of Illness: The appropriate patient status for this patient is OBSERVATION. Observation status is judged to be reasonable and necessary in order to provide the required intensity of service to ensure the patient's safety. The patient's presenting symptoms, physical exam findings, and initial radiographic and laboratory data in the context of their medical condition is felt to place them at decreased risk for further clinical deterioration. Furthermore, it is anticipated that the patient will be medically stable for discharge from the hospital within 2 midnights of admission.   Author: Novel Franchot,  MD 11/15/2024 3:35 PM  For on call review [email or web address].     [1] No Known Allergies

## 2024-11-15 NOTE — ED Provider Notes (Signed)
 [facility] Provider Note    Event Date/Time   First MD Initiated Contact with Patient 11/15/24 1232     (approximate)   History   Weakness, Rectal Bleeding, and Altered Mental Status   HPI  Rodney Wiley is a 88 y.o. male history of CAD, CKD, hypertension, GERD, hyperlipidemia, hypertension, heart failure with preserved ejection fraction, presenting with melena.  Started 1 to 2 weeks ago.  States that he has some mild abdominal pain but none today.  No nausea or vomiting.  Had noted some dyspnea on exertion several days ago, states he does not feel short of breath at this time, no chest pain or fever, no cough.  Independent history from son, he did a fall on Friday, did not hit his head, had a skin tear to his left forearm that son applied Vaseline and wrapped.  Patient was also complaining about left knee pain and left shoulder pain.  Son also felt that he was more weak than usual.  Has history of GI bleed in the past.  Dates that he no longer is on anticoagulation.  On independent chart review, he was admitted in September for lower GI bleed with acute anemia.  Had presented at that time with blood in the toilet.  Had upper and lower endoscopy that showed diverticular process in the lower bowels.  Also showed hemorrhoids that they thought could have been the cause of the bleed.  No concern for mass were noted in the gastric cardia.     Physical Exam   Triage Vital Signs: ED Triage Vitals  Encounter Vitals Group     BP 11/15/24 1106 136/68     Girls Systolic BP Percentile --      Girls Diastolic BP Percentile --      Boys Systolic BP Percentile --      Boys Diastolic BP Percentile --      Pulse Rate 11/15/24 1106 69     Resp 11/15/24 1106 17     Temp 11/15/24 1106 97.6 F (36.4 C)     Temp Source 11/15/24 1106 Oral     SpO2 11/15/24 1106 100 %     Weight 11/15/24 1109 130 lb (59 kg)     Height 11/15/24 1109 5' 7 (1.702 m)     Head Circumference  --      Peak Flow --      Pain Score 11/15/24 1107 8     Pain Loc --      Pain Education --      Exclude from Growth Chart --     Most recent vital signs: Vitals:   11/15/24 1106  BP: 136/68  Pulse: 69  Resp: 17  Temp: 97.6 F (36.4 C)  SpO2: 100%     General: Awake, no distress.  CV:  Good peripheral perfusion.  Resp:  Normal effort.  No thoracic cage tenderness, no tachypnea respiratory distress Abd:  No distention.  Soft, nontender Other:  He is pale, no palpable skull deformities or tenderness, moving all 4 extremities without focal weakness or numbness, he has a large skin tear to his left forearm without any purulent drainage, no surrounding erythema, it is tender, also tenderness to left shoulder and left knee.  Able to range his bilateral upper extremities, right lower extremity, range of motion at his left knee is limited due to pain.  There is no swelling, erythema to his left knee.   ED Results / Procedures /  Treatments   Labs (all labs ordered are listed, but only abnormal results are displayed) Labs Reviewed  COMPREHENSIVE METABOLIC PANEL WITH GFR - Abnormal; Notable for the following components:      Result Value   CO2 19 (*)    Glucose, Bld 121 (*)    BUN 39 (*)    Creatinine, Ser 1.79 (*)    Calcium  8.3 (*)    Total Protein 6.0 (*)    Albumin  3.4 (*)    GFR, Estimated 36 (*)    All other components within normal limits  CBC - Abnormal; Notable for the following components:   RBC 1.83 (*)    Hemoglobin 5.8 (*)    HCT 18.5 (*)    MCV 101.1 (*)    RDW 15.9 (*)    All other components within normal limits  URINALYSIS, ROUTINE W REFLEX MICROSCOPIC - Abnormal; Notable for the following components:   Color, Urine YELLOW (*)    APPearance HAZY (*)    Hgb urine dipstick SMALL (*)    Protein, ur 30 (*)    Nitrite POSITIVE (*)    Leukocytes,Ua LARGE (*)    Bacteria, UA RARE (*)    All other components within normal limits  CBG MONITORING, ED - Abnormal;  Notable for the following components:   Glucose-Capillary 105 (*)    All other components within normal limits  PRO BRAIN NATRIURETIC PEPTIDE  POC OCCULT BLOOD, ED  TYPE AND SCREEN  PREPARE RBC (CROSSMATCH)  TROPONIN T, HIGH SENSITIVITY     EKG  EKG shows, sinus rhythm, rate 64, normal QS, normal QTc, no obvious ischemic ST elevation, baseline is wandering due to patient movement, T wave flattening in aVL, T wave changes new compared to prior   RADIOLOGY On my independent interpretation, shoulder x-ray without obvious fracture   PROCEDURES:  Critical Care performed: Yes, see critical care procedure note(s)  .Critical Care  Performed by: Waymond Lorelle Cummins, MD Authorized by: Waymond Lorelle Cummins, MD   Critical care provider statement:    Critical care time (minutes):  40   Critical care was time spent personally by me on the following activities:  Development of treatment plan with patient or surrogate, discussions with consultants, evaluation of patient's response to treatment, examination of patient, ordering and review of laboratory studies, ordering and review of radiographic studies, ordering and performing treatments and interventions, pulse oximetry, re-evaluation of patient's condition and review of old charts    MEDICATIONS ORDERED IN ED: Medications  0.9 %  sodium chloride  infusion (has no administration in time range)  cefTRIAXone  (ROCEPHIN ) 1 g in sodium chloride  0.9 % 100 mL IVPB (1 g Intravenous New Bag/Given 11/15/24 1310)  Tdap (ADACEL) injection 0.5 mL (0.5 mLs Intramuscular Given 11/15/24 1312)  pantoprazole  (PROTONIX ) injection 80 mg (80 mg Intravenous Given 11/15/24 1306)     IMPRESSION / MDM / ASSESSMENT AND PLAN / ED COURSE  I reviewed the triage vital signs and the nursing notes.                              Differential diagnosis includes, but is not limited to, GI bleed, given history of melena, suspect upper GI, peptic ulcer disease, anemia, for the fall, he  says he did not hit his head, not on any anticoagulation, he says mental baseline this time, will get x-rays of his shoulder, knee, forearm.  For the dyspnea on exertion, did consider viral  illness, pneumonia, atypical ACS, CHF, symptomatic anemia.  Did consider PE but he is not hypoxic and not tachycardic, no other risk factors for it.  Will get labs, EKG, troponin, BNP, chest x-ray, shoulder x-ray, forearm x-ray, knee x-ray.  Patient's presentation is most consistent with acute presentation with potential threat to life or bodily function.  Independent interpretation of labs and imaging below.  Given his melena, GI bleed, anemia requiring blood transfusions, he will need to be admitted for further management.  Consult to hospitalist who is agreeable with plan for admission and will evaluate the patient.  He is admitted.  The patient is on the cardiac monitor to evaluate for evidence of arrhythmia and/or significant heart rate changes.   Clinical Course as of 11/15/24 1336  Thu Nov 15, 2024  1252 Independent review of labs, he is acutely anemic, will plan to transfuse 2 units of packed RBCs.  Electrolytes not severely deranged, his creatinine is elevated but appears to be stable, his UA is consistent with UTI.  Will start him on antibiotics. [TT]  1252 DG Shoulder Left IMPRESSION: 1. No acute fracture or dislocation. 2. Moderate degenerative changes of the shoulder. Mild superior subluxation of the humeral head relative to the glenoid, which can be seen in the setting of rotator cuff pathology.   [TT]    Clinical Course User Index [TT] Waymond Lorelle Cummins, MD     FINAL CLINICAL IMPRESSION(S) / ED DIAGNOSES   Final diagnoses:  Weakness  Melena  Fall, initial encounter  Skin tear of left forearm without complication, initial encounter  Anemia, unspecified type  Acute pain of left knee  Acute pain of left shoulder  Acute cystitis without hematuria     Rx / DC Orders   ED Discharge  Orders     None        Note:  This document was prepared using Dragon voice recognition software and may include unintentional dictation errors.    Waymond Lorelle Cummins, MD 11/15/24 [phone]

## 2024-11-16 ENCOUNTER — Encounter: Admission: EM | Disposition: A | Payer: Self-pay | Source: Home / Self Care | Attending: Emergency Medicine

## 2024-11-16 ENCOUNTER — Encounter: Payer: Self-pay | Admitting: Student

## 2024-11-16 ENCOUNTER — Observation Stay: Admitting: Certified Registered"

## 2024-11-16 DIAGNOSIS — K209 Esophagitis, unspecified without bleeding: Secondary | ICD-10-CM

## 2024-11-16 DIAGNOSIS — K921 Melena: Principal | ICD-10-CM

## 2024-11-16 DIAGNOSIS — K5710 Diverticulosis of small intestine without perforation or abscess without bleeding: Secondary | ICD-10-CM

## 2024-11-16 HISTORY — PX: ESOPHAGOGASTRODUODENOSCOPY: SHX5428

## 2024-11-16 LAB — CBC
HCT: 23.4 % — ABNORMAL LOW (ref 39.0–52.0)
Hemoglobin: 7.7 g/dL — ABNORMAL LOW (ref 13.0–17.0)
MCH: 30.9 pg (ref 26.0–34.0)
MCHC: 32.9 g/dL (ref 30.0–36.0)
MCV: 94 fL (ref 80.0–100.0)
Platelets: 180 K/uL (ref 150–400)
RBC: 2.49 MIL/uL — ABNORMAL LOW (ref 4.22–5.81)
RDW: 16.2 % — ABNORMAL HIGH (ref 11.5–15.5)
WBC: 4.8 K/uL (ref 4.0–10.5)
nRBC: 0 % (ref 0.0–0.2)

## 2024-11-16 LAB — TYPE AND SCREEN
ABO/RH(D): O NEG
Antibody Screen: NEGATIVE
Unit division: 0
Unit division: 0

## 2024-11-16 LAB — COMPREHENSIVE METABOLIC PANEL WITH GFR
ALT: 7 U/L (ref 0–44)
AST: 13 U/L — ABNORMAL LOW (ref 15–41)
Albumin: 3.2 g/dL — ABNORMAL LOW (ref 3.5–5.0)
Alkaline Phosphatase: 58 U/L (ref 38–126)
Anion gap: 9 (ref 5–15)
BUN: 32 mg/dL — ABNORMAL HIGH (ref 8–23)
CO2: 19 mmol/L — ABNORMAL LOW (ref 22–32)
Calcium: 8.4 mg/dL — ABNORMAL LOW (ref 8.9–10.3)
Chloride: 111 mmol/L (ref 98–111)
Creatinine, Ser: 1.68 mg/dL — ABNORMAL HIGH (ref 0.61–1.24)
GFR, Estimated: 39 mL/min — ABNORMAL LOW (ref >60)
Glucose, Bld: 82 mg/dL (ref 70–99)
Potassium: 4.7 mmol/L (ref 3.5–5.1)
Sodium: 138 mmol/L (ref 135–145)
Total Bilirubin: 0.6 mg/dL (ref 0.0–1.2)
Total Protein: 5.6 g/dL — ABNORMAL LOW (ref 6.5–8.1)

## 2024-11-16 LAB — BPAM RBC
Blood Product Expiration Date: 202601112359
Blood Product Expiration Date: 202601112359
ISSUE DATE / TIME: 202512111345
ISSUE DATE / TIME: 202512111648
Unit Type and Rh: 5100
Unit Type and Rh: 5100

## 2024-11-16 SURGERY — EGD (ESOPHAGOGASTRODUODENOSCOPY)
Anesthesia: General

## 2024-11-16 MED ORDER — SODIUM CHLORIDE 0.9 % IV SOLN: INTRAVENOUS | Status: AC

## 2024-11-16 MED ORDER — SODIUM CHLORIDE 0.9 % IV SOLN: INTRAVENOUS | Status: DC

## 2024-11-16 MED ORDER — PROPOFOL 10 MG/ML IV BOLUS
INTRAVENOUS | Status: DC | PRN
  Administered 2024-11-16: 50 mg via INTRAVENOUS

## 2024-11-16 MED ORDER — GLYCOPYRROLATE 0.2 MG/ML IJ SOLN
INTRAMUSCULAR | Status: DC | PRN
  Administered 2024-11-16: .2 mg via INTRAVENOUS

## 2024-11-16 MED ORDER — PEG 3350-KCL-NA BICARB-NACL 420 G PO SOLR
4000.0000 mL | Freq: Once | ORAL | Status: AC
  Administered 2024-11-16: 4000 mL via ORAL
  Filled 2024-11-16: qty 4000

## 2024-11-16 MED ORDER — LIDOCAINE 2% (20 MG/ML) 5 ML SYRINGE
INTRAMUSCULAR | Status: DC | PRN
  Administered 2024-11-16: 20 mg via INTRAVENOUS

## 2024-11-16 MED ORDER — PROPOFOL 500 MG/50ML IV EMUL
INTRAVENOUS | Status: DC | PRN
  Administered 2024-11-16: 100 ug/kg/min via INTRAVENOUS

## 2024-11-16 NOTE — Progress Notes (Signed)
 Progress Note   Patient: Rodney Wiley FMW:[id number] DOB: [birth year] DOA: 11/15/2024     0 DOS: the patient was seen and examined on 11/16/2024   Brief hospital course: HPI: Rodney Wiley is a 88 y.o. male with medical history significant of AS s/p valve replacement, recent GI bleed (thought to be diverticular 08/2024), BPH s/p suprapubic catheter,  CKD stage 3b, and HTN.    He presents to the ED after having dark stools for the past 2 weeks.  He states that since his dark stool started there occurred every couple days and he will sometimes have bowel movements a day.  He had a large black bowel movement 6 days prior to admission and subsequently had a syncopal episode.  His last bowel movement was day prior to admission and this morning normal in color but continues to have black specks.  His family encouraged him to come to the ED since his syncopal episode 6 days ago-patient refused until today. Patient has also noted some general malaise and occasional mental confusion.    He states he last took his aspirin  08/2024 after hospitalization for LGIB. He takes Advil occasionally but last took this about a week ago.  He had a colonoscopy 08/16/2024 which showed multiple medium mouthed diverticula in the L colon and non bleeding external hemorrhoids. Upper endoscopy notable for large hiatal hernia, and few localized erosions with no bleeding or stagmata of recent bleeding in the gastric antrum.    He denies BRBPR, chest pain, dyspnea, abdominal pain, fever, or chills.    ED: Patient was noted to be hypotensive, NL HR, and 100% O2 sat on RA. Labs were notable for stably elevated scr, bicarb 19, and hgb 5.8. Xray of L knee, forearm and shoulder were obtained given fall 6 d ago and were negative for acute fracture.    He was transfused 2u of pRBCs.   Assessment and Plan: GIB  S/p 2u of PRBCs.  Underwent EGD this a.m. - Plan for colonoscopy in AM.  Iron  deficiency anemia Hemoglobin improved with 2  units of packed red blood cells Follow-up iron  panel Monitoring hemoglobin daily  CKD3b  Appears at baseline.   NAGMA Will continue to monitor.   Acute non ischemic injury : Elevated to 10 8 repeat 100.  Likely in the setting of his anemia.  Patient had malaise but no chest pain or dyspnea.  EKG with no findings concerning for acute ischemia. No further cardiac while inpatient  Protein calorie malnutrition BMI 20 Ensures when able  Chronic problems Hypertension Hyperlipidemia BPH HFpEF Restart home medications as able      Subjective: Feels much better after receiving blood.  Physical Exam: Vitals:   11/16/24 1058 11/16/24 1108 11/16/24 1118 11/16/24 1548  BP: 136/83 (!) 166/83  (!) 152/86  Pulse: 70 69 74 88  Resp: 20 15 14 17   Temp:    97.9 F (36.6 C)  TempSrc:      SpO2: 99% 99% 99% 100%  Weight:      Height:       Physical Exam  Constitutional: In no distress.  Cardiovascular: Normal rate, regular chronically ill-appearing rhythm. No lower extremity edema  Pulmonary: Non labored breathing on room air, no wheezing or rales.   Abdominal: Soft. Non distended and non tender Musculoskeletal: Normal range of motion.     Neurological: Alert and oriented to person, place, and time. Non focal  Skin: Skin is warm and dry.   Data Reviewed:  Latest Ref Rng & Units 11/16/2024    5:05 AM 11/15/2024   11:17 AM 09/20/2024    4:40 AM  CBC  WBC 4.0 - 10.5 K/uL 4.8  5.4  5.4   Hemoglobin 13.0 - 17.0 g/dL 7.7  5.8  9.7   Hematocrit 39.0 - 52.0 % 23.4  18.5  29.0   Platelets 150 - 400 K/uL 180  187  157       Latest Ref Rng & Units 11/16/2024    5:05 AM 11/15/2024   11:17 AM 09/20/2024    4:40 AM  BMP  Glucose 70 - 99 mg/dL 82  878  86   BUN 8 - 23 mg/dL 32  39  30   Creatinine 0.61 - 1.24 mg/dL 8.31  8.20  8.13   Sodium 135 - 145 mmol/L 138  138  139   Potassium 3.5 - 5.1 mmol/L 4.7  4.6  3.8   Chloride 98 - 111 mmol/L 111  107  107   CO2 22 - 32  mmol/L 19  19  21    Calcium  8.9 - 10.3 mg/dL 8.4  8.3  8.4      Family Communication: None at bedside.   Disposition: Status is: Observation The patient remains OBS appropriate and will d/c before 2 midnights.  Planned Discharge Destination: Home    Time spent: 35 minutes  Author: Alban Pepper, MD 11/16/2024 6:10 PM  For on call review [email or web address].

## 2024-11-16 NOTE — Anesthesia Preprocedure Evaluation (Signed)
 Anesthesia Evaluation  Patient identified by MRN, date of birth, ID band Patient awake    Reviewed: Allergy & Precautions, H&P , NPO status , Patient's Chart, lab work & pertinent test results, reviewed documented beta blocker date and time   History of Anesthesia Complications Negative for: history of anesthetic complications  Airway Mallampati: II  TM Distance: >3 FB Neck ROM: full    Dental  (+) Upper Dentures, Edentulous Upper, Dental Advidsory Given, Poor Dentition   Pulmonary neg pulmonary ROS   Pulmonary exam normal breath sounds clear to auscultation       Cardiovascular Exercise Tolerance: Good hypertension, (-) angina + CAD, + CABG and + Peripheral Vascular Disease  (-) Past MI Normal cardiovascular exam+ dysrhythmias + Valvular Problems/Murmurs (s/p AVR for thoracic AAA)  Rhythm:regular Rate:Normal  ECHO 07/19/24: 1. Left ventricular ejection fraction, by estimation, is 60 to 65%. The left ventricle has normal function. The left ventricle has no regional wall motion abnormalities. There is moderate left ventricular hypertrophy. Left ventricular diastolic parameters are indeterminate. The average left ventricular global longitudinal strain is -13.5 %. The global longitudinal strain is abnormal.   2. Right ventricular systolic function is normal. The right ventricular size is normal.   3. Left atrial size was moderately dilated.   4. The mitral valve is normal in structure. Moderate mitral valve regurgitation. No evidence of mitral stenosis.   5. The aortic valve has been repaired/replaced, edwards Sapien bioprosthetic aortic valve 02/2018. Aortic valve regurgitation is not visualized. Aortic valve area, by VTI measures 0.78 cm. Aortic valve mean gradient measures 20.0 mmHg. Aortic valve Vmax  measures 3.12 m/s.   6. The inferior vena cava is normal in size with greater than 50% respiratory variability, suggesting right atrial  pressure of 3 mmHg.     Neuro/Psych negative neurological ROS  negative psych ROS   GI/Hepatic Neg liver ROS,GERD  ,,  Endo/Other  negative endocrine ROS    Renal/GU Renal disease (stage 3 CKD)  negative genitourinary   Musculoskeletal   Abdominal   Peds  Hematology  (+) Blood dyscrasia, anemia   Anesthesia Other Findings Past Medical History: No date: Anemia No date: Aortic insufficiency     Comment:  a. 02/2018 s/p bioprosthetic AVR; 04/2018 Echo: EF 50-55%,              Gr2 DD, Ao bioprosthesis, mean grad , Ao root/Asc               Ao nl in size, mild MR, mildly dil LA, nl RV fxn. Nl               PASP. No date: Arthropathy No date: Ascending aortic aneurysm     Comment:  a. 12/2016 MRA: 4.3cm @ sinus of valsalva, 4.9cm above               sinotubular jxn; b. 02/2018 s/p biological Bentall and               AVR. No date: BPH (benign prostatic hyperplasia) 02/22/2018: CAD S/P CABG x 3     Comment:  a. 02/2018 Cath: LAD 20ost, 40p, 62m, LCX 60p, OM2 60,               OM3 85, RCA 10p/m w/ L->R and R->R collats;  b. 02/2018               CABG x 3: LIMA to LAD, SVG to OM2, SVG to PDA, EVH via  right thigh. No date: CKD (chronic kidney disease), stage III (HCC) No date: Diverticulitis No date: Essential hypertension No date: GERD (gastroesophageal reflux disease) No date: Hemorrhoids No date: History of chicken pox 06/2007: History of Helicobacter pylori infection No date: Hyperlipidemia No date: Hypertension No date: Left renal artery stenosis     Comment:  a. 01/2017 RA u/s: moderate L RAS. No date: Lower extremity edema No date: Nonrheumatic mitral (valve) insufficiency     Comment:  a. 12/2016 Echo: mild to mod MR; b. 09/2017 TEE: mild to               mod MR; c. 04/2018 Echo: Mild MR. 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 s/p 21 mm Edwards Inspiris Resilia stented bovine               pericardial tissue valve and 24 mm Gelweave Valsalva               synthetic root conduit with reimplantation of left main               coronary artery.   Reproductive/Obstetrics negative OB ROS                              Anesthesia Physical Anesthesia Plan  ASA: 3  Anesthesia Plan: General   Post-op Pain Management:    Induction: Intravenous  PONV Risk Score and Plan: 2 and Propofol  infusion, TIVA and Treatment may vary due to age or medical condition  Airway Management Planned: Natural Airway and Nasal Cannula  Additional Equipment:   Intra-op Plan:   Post-operative Plan:   Informed Consent: I have reviewed the patients History and Physical, chart, labs and discussed the procedure including the risks, benefits and alternatives for the proposed anesthesia with the patient or authorized representative who has indicated his/her understanding and acceptance.     Dental Advisory Given  Plan Discussed with: Anesthesiologist, CRNA and Surgeon  Anesthesia Plan Comments:          Anesthesia Quick Evaluation

## 2024-11-16 NOTE — Consult Note (Signed)
 Rogelia Copping, MD [facility]  [address], [address] [place], [place] [address] Phone: [phone] Fax : [phone]  Consultation  Referring Provider:     Dr. Franchot Primary Care Physician:  Donal Channing SQUIBB, FNP Primary Gastroenterologist: [facility]         Reason for Consultation:     Melena  Date of Admission:  11/15/2024 Date of Consultation:  11/16/2024         HPI:   Rodney Wiley is a 88 y.o. male who was recently in the hospital at [facility] for an GI bleed.  The patient had an EGD and colonoscopy.  The patient had findings of:  EGD: Impression:            - No findings to explain abnormal CT findings.                         - Large hiatal hernia.                         - Erosive gastropathy with no bleeding and no stigmata                         of recent bleeding.                         - Normal examined duodenum.                         - No specimens collected.   Colonoscopy: Impression:            - Recent hematocheizia may have been from hemorrhoidal                         bleeding or diverticular bleeding. Bleeding has                         resolved.                         - Diverticulosis in the left colon. There was                         narrowing of the colon in association with the                         diverticular opening.                         - Non-bleeding external hemorrhoids.                         - The examined portion of the ileum was normal.                         - No specimens collected.   The patient denies taking any NSAIDs but has been having black stools for about 8 days.  The patient states that his stools have turned back to brown on his last bowel movement yesterday.  The patient's most recent blood work has shown:  Component     Latest Ref Rng 09/19/2024 09/20/2024 11/15/2024 11/16/2024  Hemoglobin     13.0 - 17.0 g/dL 8.5 (L)  9.7 (L)  5.8 (L)  7.7 (L)   HCT     39.0 - 52.0 % 26.3 (L)  29.0 (L)  18.5 (L)  23.4 (L)     In addition the patient also received 2 units of packed red blood cells overnight.  He denies any abdominal pain associated with his symptoms.  There is also no report of any hematemesis.  He states that when the stools for started to change color he noticed some red in the stool but he quickly turned black.  The patient has had an elevated troponin but denies any chest pain or shortness of breath and the troponin has been stable.  Past Medical History:  Diagnosis Date   Anemia    Aortic insufficiency    a. 02/2018 s/p bioprosthetic AVR; 04/2018 Echo: EF 50-55%, Gr2 DD, Ao bioprosthesis, mean grad , Ao root/Asc Ao nl in size, mild MR, mildly dil LA, nl RV fxn. Nl PASP.   Arthropathy    Ascending aortic aneurysm    a. 12/2016 MRA: 4.3cm @ sinus of valsalva, 4.9cm above sinotubular jxn; b. 02/2018 s/p biological Bentall and AVR.   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 02/2018 Cath: LAD 20ost, 40p, 50m, LCX 60p, OM2 60, OM3 85, RCA 10p/m w/ L->R and R->R collats;  b. 02/2018 CABG x 3: LIMA to LAD, SVG to OM2, SVG to PDA, EVH via right thigh.   CKD (chronic kidney disease), stage III (HCC)    Diverticulitis    Essential hypertension    GERD (gastroesophageal reflux disease)    Hemorrhoids    History of chicken pox    History of Helicobacter pylori infection 06/2007   Hyperlipidemia    Hypertension    Left renal artery stenosis    a. 01/2017 RA u/s: moderate L RAS.   Lower extremity edema    Nonrheumatic mitral (valve) insufficiency    a. 12/2016 Echo: mild to mod MR; b. 09/2017 TEE: mild to mod MR; c. 04/2018 Echo: Mild MR.   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 s/p 21 mm Edwards Inspiris Resilia stented bovine pericardial tissue valve and 24 mm Gelweave Valsalva synthetic root conduit with reimplantation of left main coronary artery.     Past Surgical History:  Procedure Laterality Date   AORTIC VALVE REPAIR N/A 02/22/2018   Procedure: AORTIC VALVE REPLACEMENT -Biological Bentall aortic root replacement,;  Surgeon: Dusty Sudie DEL, MD;  Location: [facility] OR;  Service: Open Heart Surgery;  Laterality: N/A;   bypass surgery     CARDIAC CATHETERIZATION     01/16/2018   COLONOSCOPY  2011   by Dr. Ora with findings of diverticulosis   CORONARY ARTERY BYPASS GRAFT N/A 02/22/2018   Procedure: CORONARY ARTERY BYPASS GRAFTING (CABG) x three, using left internal mammary artery and right leg greater saphenous vein harvested endoscopically;  Surgeon: Dusty Sudie DEL, MD;  Location: [facility] OR;  Service: Open Heart Surgery;  Laterality: N/A;   CORONARY/GRAFT ANGIOGRAPHY N/A 01/16/2018   Procedure: CORONARY/GRAFT ANGIOGRAPHY;  Surgeon: Mady Bruckner, MD;  Location: [facility] INVASIVE CV LAB;  Service: Cardiovascular;  Laterality: N/A;   ELBOW SURGERY Left    hemorhoidectomy     IR BONE MARROW BIOPSY & ASPIRATION  01/30/2024   IR CATHETER TUBE CHANGE  07/30/2024  IR CYSTOSTOMY TUBE PLACEMENT/BLADDER ASPIRATION  05/22/2024   RIGHT HEART CATH N/A 01/16/2018   Procedure: RIGHT HEART CATH;  Surgeon: Mady Bruckner, MD;  Location: [facility] INVASIVE CV LAB;  Service: Cardiovascular;  Laterality: N/A;   SHOULDER ARTHROSCOPY WITH ROTATOR CUFF REPAIR Right    SHOULDER SURGERY Right    TEE WITHOUT CARDIOVERSION N/A 09/27/2017   Procedure: TRANSESOPHAGEAL ECHOCARDIOGRAM (TEE);  Surgeon: Maranda Leim DEL, MD;  Location: [facility] ENDOSCOPY;  Service: Cardiovascular;  Laterality: N/A;   TEE WITHOUT CARDIOVERSION N/A 02/22/2018   Procedure: TRANSESOPHAGEAL ECHOCARDIOGRAM (TEE);  Surgeon: Dusty Sudie DEL, MD;  Location: [facility] OR;  Service: Open Heart Surgery;  Laterality: N/A;   THORACIC AORTIC ANEURYSM REPAIR N/A 02/22/2018   Procedure: THORACIC ASCENDING ANEURYSM REPAIR (AAA) Resection of ascending aorta aneurysm;  Surgeon: Dusty Sudie DEL, MD;  Location: [facility] OR;  Service:  Open Heart Surgery;  Laterality: N/A;    Prior to Admission medications  Medication Sig Start Date End Date Taking? Authorizing Provider  amLODipine  (NORVASC ) 5 MG tablet Take 1 tablet (5 mg total) by mouth 2 (two) times daily. 09/20/24  Yes Franchot Novel, MD  aspirin  EC 81 MG tablet Take 81 mg by mouth daily.   Yes [provider]  atorvastatin  (LIPITOR) 40 MG tablet Take 1 tablet (40 mg total) by mouth daily. 05/18/24  Yes End, Bruckner, MD  cholecalciferol  (VITAMIN D3) 25 MCG (1000 UT) tablet Take 1,000 Units by mouth daily.   Yes [provider]  isosorbide  mononitrate (IMDUR ) 60 MG 24 hr tablet Take 60 mg by mouth daily. 11/05/24  Yes [provider]  lisinopril  (ZESTRIL ) 5 MG tablet Take 2 tablets (10 mg total) by mouth daily. 09/20/24  Yes Franchot Novel, MD  torsemide  (DEMADEX ) 10 MG tablet TAKE 1 TABLET BY MOUTH EVERY DAY AS NEEDED 07/11/23  Yes End, Bruckner, MD  traMADol  (ULTRAM ) 50 MG tablet Take 50 mg by mouth every 6 (six) hours as needed. 07/15/23  Yes [provider]  pantoprazole  (PROTONIX ) 40 MG tablet Take 1 tablet (40 mg total) by mouth daily. 08/21/23 09/18/24  Dorinda Drue DASEN, MD  polyethylene glycol (MIRALAX ) 17 g packet Take 17 g by mouth daily. Patient not taking: Reported on 11/15/2024 09/20/24   Franchot Novel, MD  tamsulosin  (FLOMAX ) 0.4 MG CAPS capsule Take 1 capsule (0.4 mg total) by mouth daily. Patient not taking: Reported on 09/18/2024 01/05/24   Helon Clotilda LABOR, PA-C    Family History  Problem Relation Age of Onset   Hypertension Mother    Stroke Mother    Leukemia Father    Heart attack Paternal Grandmother    Stroke Paternal Grandfather    Heart Problems Son 48   Heart Problems Son 89   Uterine cancer Sister    Prostate cancer Brother    Colon cancer Brother    Kidney failure Brother    Stroke Sister      Social History[1]  Allergies as of 11/15/2024   (No Known Allergies)    Review of Systems:     All systems reviewed and negative except where noted in HPI.   Physical Exam:  Vital signs in last 24 hours: Temp:  [97.1 F (36.2 C)-98.5 F (36.9 C)] 97.8 F (36.6 C) (12/11 1941) Pulse Rate:  [50-80] 56 (12/11 1941) Resp:  [14-18] 18 (12/11 1941) BP: (136-204)/(63-113) 171/63 (12/11 1941) SpO2:  [98 %-100 %] 100 % (12/11 1941) Weight:  [59 kg] 59 kg (12/11 1109)   General:   Pleasant, cooperative  in NAD Head:  Normocephalic and atraumatic. Eyes:   No icterus.   Conjunctiva pink. PERRLA. Ears:  Normal auditory acuity. Neck:  Supple; no masses or thyroidomegaly Lungs: Respirations even and unlabored. Lungs clear to auscultation bilaterally.   No wheezes, crackles, or rhonchi.  Heart:  Regular rate and rhythm;  Without murmur, clicks, rubs or gallops Abdomen:  Soft, nondistended, nontender. Normal bowel sounds. No appreciable masses or hepatomegaly.  No rebound or guarding.  Rectal:  Not performed. Msk:  Symmetrical without gross deformities.  Extremities:  Without edema, cyanosis or clubbing. Neurologic:  Alert and oriented x3;  grossly normal neurologically. Skin:  Intact without significant lesions or rashes. Cervical Nodes:  No significant cervical adenopathy. Psych:  Alert and cooperative. Normal affect.  LAB RESULTS: Recent Labs    11/15/24 1117 11/16/24 0505  WBC 5.4 4.8  HGB 5.8* 7.7*  HCT 18.5* 23.4*  PLT 187 180   BMET Recent Labs    11/15/24 1117 11/16/24 0505  NA 138 138  K 4.6 4.7  CL 107 111  CO2 19* 19*  GLUCOSE 121* 82  BUN 39* 32*  CREATININE 1.79* 1.68*  CALCIUM  8.3* 8.4*   LFT Recent Labs    11/16/24 0505  PROT 5.6*  ALBUMIN  3.2*  AST 13*  ALT 7  ALKPHOS 58  BILITOT 0.6   PT/INR Recent Labs    11/15/24 2155  LABPROT 15.0  INR 1.1    STUDIES: DG Knee Complete 4 Views Left Result Date: 11/15/2024 CLINICAL DATA:  Pain.  Recent fall. EXAM: LEFT KNEE - COMPLETE 4+ VIEW COMPARISON:  None Available. FINDINGS: No acute  fracture or dislocation. Small joint effusion. Lateral and patellofemoral compartment predominant moderate osteoarthritis with joint space narrowing. Peripheral vascular calcifications are noted. IMPRESSION: 1. No acute osseous abnormality. 2. Moderate tricompartmental osteoarthritis with small joint effusion. Electronically Signed   By: Harrietta Sherry M.D.   On: 11/15/2024 13:46   DG Chest 1 View Result Date: 11/15/2024 CLINICAL DATA:  Pain.  Recent fall. EXAM: CHEST  1 VIEW COMPARISON:  09/18/2024. FINDINGS: The heart size and mediastinal contours are within normal limits. Aortic atherosclerosis. 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 Median sternotomy. No focal consolidation, pleural effusion, or pneumothorax. No acute osseous abnormality. IMPRESSION: No acute cardiopulmonary findings. Electronically Signed   By: Harrietta Sherry M.D.   On: 11/15/2024 13:45   DG Forearm Left Result Date: 11/15/2024 CLINICAL DATA:  Pain.  Recent fall. EXAM: LEFT FOREARM - 2 VIEW COMPARISON:  None Available. FINDINGS: No evidence of acute fracture. Well corticated ossicle along the superior aspect of the radiocapitellar joint may relate to prior trauma. Soft tissues are unremarkable. IMPRESSION: No acute osseous abnormality. Electronically Signed   By: Harrietta Sherry M.D.   On: 11/15/2024 13:42   DG Shoulder Left Result Date: 11/15/2024 CLINICAL DATA:  Status post fall with left shoulder pain EXAM: LEFT SHOULDER - 3 VIEW COMPARISON:  None Available. FINDINGS: There is no evidence of fracture or dislocation. Mild superior subluxation of the humeral head relative to the glenoid. Moderate degenerative changes of the glenohumeral and acromioclavicular joints. Soft tissues are unremarkable. Radiopaque device projects over the upper lateral left chest. Median sternotomy wires are nondisplaced. IMPRESSION: 1. No acute fracture or dislocation. 2. Moderate degenerative changes of the shoulder. Mild superior subluxation of the  humeral head relative to the glenoid, which can be seen in the setting of rotator cuff pathology. Electronically Signed   By: Limin  Xu M.D.   On: 11/15/2024  12:19      Impression / Plan:   Assessment: Principal Problem:   UGIB (upper gastrointestinal bleed)   Rodney Wiley is a 88 y.o. y/o male with melena for the last 8 days with it turning brown yesterday.  The patient came in with a decreased hemoglobin and received 2 units of packed red blood cells overnight with appropriate increase in his hemoglobin.  The patient denies any NSAID use.  The patient's differential diagnosis includes continued erosive gastropathy that was found back in September at [facility] when he had an upper endoscopy versus peptic ulcer disease.  Plan:  The patient will be set up for an upper endoscopy for today.  The patient has already been NPO.  The patient has been explained the plan and agrees with it.  I have spoken to the patient's hospitalist to believe that the elevated but stable troponin is likely from his worsening anemia.  PPI IV twice daily  Continue serial CBCs and transfuse PRN Avoid NSAIDs Maintain 2 large-bore IV lines Please page GI with any acute hemodynamic changes, or signs of active GI bleeding   Thank you for involving me in the care of this patient.      LOS: 0 days   Rogelia Copping, MD, MD. NOLIA 11/16/2024, 7:16 AM,  Pager [phone] 7am-5pm  Check AMION for 5pm -7am coverage and on weekends   Note: This dictation was prepared with Dragon dictation along with smaller phrase technology. Any transcriptional errors that result from this process are unintentional.       [1]  Social History Tobacco Use   Smoking status: Never   Smokeless tobacco: Never  Vaping Use   Vaping status: Never Used  Substance Use Topics   Alcohol  use: No   Drug use: No

## 2024-11-16 NOTE — TOC CM/SW Note (Signed)
 Transition of Care [facility]) - Inpatient Brief Assessment   Patient Details  Name: Rodney Wiley MRN: [id number] Date of Birth: [birth year]  Transition of Care [facility]) CM/SW Contact:    Corean ONEIDA Haddock, RN Phone Number: 11/16/2024, 11:22 AM   Clinical Narrative:  Transition of Care Department [facility]) has reviewed patient and no TOC needs have been identified at this time.  If new patient transition needs arise, please place a TOC consult.   Transition of Care Asessment: Insurance and Status: Insurance coverage has been reviewed Patient has primary care physician: Yes     Prior/Current Home Services: No current home services Social Drivers of Health Review: SDOH reviewed no interventions necessary Readmission risk has been reviewed: Yes Transition of care needs: no transition of care needs at this time

## 2024-11-16 NOTE — Op Note (Signed)
 [facility] Gastroenterology Patient Name: Rodney Wiley Procedure Date: 11/16/2024 10:18 AM MRN: [id number] Account #: [id number] Date of Birth: Sep 08, 1936 Admit Type: Inpatient Age: 88 Room: [facility] ENDO ROOM 2 Gender: Male Note Status: Finalized Instrument Name: Upper GI Scope [phone] Procedure:             Upper GI endoscopy Indications:           Melena Providers:             Rogelia Copping MD, MD Referring MD:          Channing SQUIBB. Donal (Referring MD) Medicines:             Propofol  per Anesthesia Complications:         No immediate complications. Procedure:             Pre-Anesthesia Assessment:                        - Prior to the procedure, a History and Physical was                         performed, and patient medications and allergies were                         reviewed. The patient's tolerance of previous                         anesthesia was also reviewed. The risks and benefits                         of the procedure and the sedation options and risks                         were discussed with the patient. All questions were                         answered, and informed consent was obtained.                         [Anticoagulant Agents] [Ijbd Prior to Procedure]. [ASA                         Grade]. After reviewing the risks and benefits, the                         patient was deemed in satisfactory condition to                         undergo the procedure.                        After obtaining informed consent, the endoscope was                         passed under direct vision. Throughout the procedure,                         the patient's blood pressure, pulse, and oxygen  saturations were monitored continuously. The Endoscope                         was introduced through the mouth, and advanced to the                         third part of duodenum. The upper GI endoscopy was                         accomplished  without difficulty. The patient tolerated                         the procedure well. Findings:      LA Grade C (one or more mucosal breaks continuous between tops of 2 or       more mucosal folds, less than 75% circumference) esophagitis with no       bleeding was found in the lower third of the esophagus.      The entire examined stomach was normal.      A large non-bleeding diverticulum was found in the second portion of the       duodenum. Impression:            - LA Grade C esophagitis with no bleeding.                        - Normal stomach.                        - Non-bleeding duodenal diverticulum.                        - No specimens collected. Recommendation:        - Return patient to hospital ward for ongoing care.                        - Clear liquid diet.                        - Continue present medications.                        - Perform a colonoscopy tomorrow. Procedure Code(s):     --- Professional ---                        [phone], Esophagogastroduodenoscopy, flexible,                         transoral; diagnostic, including collection of                         specimen(s) by brushing or washing, when performed                         (separate procedure) Diagnosis Code(s):     --- Professional ---                        K92.1, Melena (includes Hematochezia)                        K20.90, Esophagitis, unspecified without  bleeding CPT copyright 2022 American Medical Association. All rights reserved. The codes documented in this report are preliminary and upon coder review may  be revised to meet current compliance requirements. Rogelia Copping MD, MD 11/16/2024 10:46:20 AM This report has been signed electronically. Number of Addenda: 0 Note Initiated On: 11/16/2024 10:18 AM Estimated Blood Loss:  Estimated blood loss: none.      [facility]

## 2024-11-16 NOTE — Transfer of Care (Signed)
 Immediate Anesthesia Transfer of Care Note  Patient: Rodney Wiley  Procedure(s) Performed: EGD (ESOPHAGOGASTRODUODENOSCOPY)  Patient Location: Endoscopy Unit  Anesthesia Type:General  Level of Consciousness: drowsy  Airway & Oxygen Therapy: Patient Spontanous Breathing  Post-op Assessment: Report given to RN and Post -op Vital signs reviewed and stable  Post vital signs: Reviewed  Last Vitals:  Vitals Value Taken Time  BP 89/57 11/16/24 10:48  Temp 36.2 C 11/16/24 10:48  Pulse 67 11/16/24 10:48  Resp 17 11/16/24 10:48  SpO2 98 % 11/16/24 10:48    Last Pain:  Vitals:   11/16/24 1048  TempSrc: Temporal  PainSc:          Complications: No notable events documented.

## 2024-11-16 NOTE — Care Management Obs Status (Signed)
 MEDICARE OBSERVATION STATUS NOTIFICATION   Patient Details  Name: Rodney Wiley MRN: [id number] Date of Birth: [birth year]   Medicare Observation Status Notification Given:  Yes    Deonte Otting W, CMA 11/16/2024, 11:54 AM

## 2024-11-17 ENCOUNTER — Encounter: Admission: EM | Disposition: A | Payer: Self-pay | Source: Home / Self Care | Attending: Emergency Medicine

## 2024-11-17 ENCOUNTER — Observation Stay: Admitting: General Practice

## 2024-11-17 ENCOUNTER — Encounter: Payer: Self-pay | Admitting: Student

## 2024-11-17 DIAGNOSIS — K922 Gastrointestinal hemorrhage, unspecified: Secondary | ICD-10-CM | POA: Diagnosis not present

## 2024-11-17 LAB — IRON AND TIBC
Iron: 35 ug/dL — ABNORMAL LOW (ref 45–182)
Saturation Ratios: 15 % — ABNORMAL LOW (ref 17.9–39.5)
TIBC: 231 ug/dL — ABNORMAL LOW (ref 250–450)
UIBC: 196 ug/dL

## 2024-11-17 LAB — CBC
HCT: 27.5 % — ABNORMAL LOW (ref 39.0–52.0)
Hemoglobin: 9 g/dL — ABNORMAL LOW (ref 13.0–17.0)
MCH: 31.1 pg (ref 26.0–34.0)
MCHC: 32.7 g/dL (ref 30.0–36.0)
MCV: 95.2 fL (ref 80.0–100.0)
Platelets: 221 K/uL (ref 150–400)
RBC: 2.89 MIL/uL — ABNORMAL LOW (ref 4.22–5.81)
RDW: 15.9 % — ABNORMAL HIGH (ref 11.5–15.5)
WBC: 7.3 K/uL (ref 4.0–10.5)
nRBC: 0 % (ref 0.0–0.2)

## 2024-11-17 LAB — FERRITIN: Ferritin: 173 ng/mL (ref 24–336)

## 2024-11-17 LAB — BASIC METABOLIC PANEL WITH GFR
Anion gap: 11 (ref 5–15)
BUN: 24 mg/dL — ABNORMAL HIGH (ref 8–23)
CO2: 21 mmol/L — ABNORMAL LOW (ref 22–32)
Calcium: 8.2 mg/dL — ABNORMAL LOW (ref 8.9–10.3)
Chloride: 109 mmol/L (ref 98–111)
Creatinine, Ser: 1.51 mg/dL — ABNORMAL HIGH (ref 0.61–1.24)
GFR, Estimated: 45 mL/min — ABNORMAL LOW (ref >60)
Glucose, Bld: 85 mg/dL (ref 70–99)
Potassium: 4.7 mmol/L (ref 3.5–5.1)
Sodium: 141 mmol/L (ref 135–145)

## 2024-11-17 SURGERY — COLONOSCOPY
Anesthesia: General

## 2024-11-17 MED ORDER — IRON SUCROSE 300 MG IVPB - SIMPLE MED
300.0000 mg | Freq: Once | Status: AC
  Administered 2024-11-17: 14:00:00 300 mg via INTRAVENOUS
  Filled 2024-11-17: qty 300

## 2024-11-17 MED ORDER — PROPOFOL 10 MG/ML IV BOLUS
INTRAVENOUS | Status: DC | PRN
  Administered 2024-11-17: 80 mg via INTRAVENOUS
  Administered 2024-11-17: 100 ug/kg/min via INTRAVENOUS

## 2024-11-17 MED ORDER — PROPOFOL 1000 MG/100ML IV EMUL
INTRAVENOUS | Status: AC
  Filled 2024-11-17: qty 100

## 2024-11-17 MED ORDER — SODIUM CHLORIDE 0.9 % IV SOLN: INTRAVENOUS | Status: DC

## 2024-11-17 NOTE — Plan of Care (Signed)

## 2024-11-17 NOTE — Anesthesia Preprocedure Evaluation (Signed)
 Anesthesia Evaluation  Patient identified by MRN, date of birth, ID band Patient awake    Reviewed: Allergy & Precautions, H&P , NPO status , Patient's Chart, lab work & pertinent test results, reviewed documented beta blocker date and time   History of Anesthesia Complications Negative for: history of anesthetic complications  Airway Mallampati: II  TM Distance: >3 FB Neck ROM: full    Dental  (+) Upper Dentures, Edentulous Upper, Dental Advidsory Given, Poor Dentition   Pulmonary neg pulmonary ROS   Pulmonary exam normal breath sounds clear to auscultation       Cardiovascular Exercise Tolerance: Good hypertension, (-) angina + CAD, + CABG and + Peripheral Vascular Disease  (-) Past MI Normal cardiovascular exam+ dysrhythmias + Valvular Problems/Murmurs (s/p AVR for thoracic AAA)  Rhythm:regular Rate:Normal  ECHO 07/19/24: 1. Left ventricular ejection fraction, by estimation, is 60 to 65%. The left ventricle has normal function. The left ventricle has no regional wall motion abnormalities. There is moderate left ventricular hypertrophy. Left ventricular diastolic parameters are indeterminate. The average left ventricular global longitudinal strain is -13.5 %. The global longitudinal strain is abnormal.   2. Right ventricular systolic function is normal. The right ventricular size is normal.   3. Left atrial size was moderately dilated.   4. The mitral valve is normal in structure. Moderate mitral valve regurgitation. No evidence of mitral stenosis.   5. The aortic valve has been repaired/replaced, edwards Sapien bioprosthetic aortic valve 02/2018. Aortic valve regurgitation is not visualized. Aortic valve area, by VTI measures 0.78 cm. Aortic valve mean gradient measures 20.0 mmHg. Aortic valve Vmax  measures 3.12 m/s.   6. The inferior vena cava is normal in size with greater than 50% respiratory variability, suggesting right atrial  pressure of 3 mmHg.     Neuro/Psych negative neurological ROS  negative psych ROS   GI/Hepatic Neg liver ROS,GERD  ,,  Endo/Other  negative endocrine ROS    Renal/GU Renal disease (stage 3 CKD)  negative genitourinary   Musculoskeletal   Abdominal   Peds  Hematology  (+) Blood dyscrasia, anemia   Anesthesia Other Findings Past Medical History: No date: Anemia No date: Aortic insufficiency     Comment:  a. 02/2018 s/p bioprosthetic AVR; 04/2018 Echo: EF 50-55%,              Gr2 DD, Ao bioprosthesis, mean grad , Ao root/Asc               Ao nl in size, mild MR, mildly dil LA, nl RV fxn. Nl               PASP. No date: Arthropathy No date: Ascending aortic aneurysm     Comment:  a. 12/2016 MRA: 4.3cm @ sinus of valsalva, 4.9cm above               sinotubular jxn; b. 02/2018 s/p biological Bentall and               AVR. No date: BPH (benign prostatic hyperplasia) 02/22/2018: CAD S/P CABG x 3     Comment:  a. 02/2018 Cath: LAD 20ost, 40p, 55m, LCX 60p, OM2 60,               OM3 85, RCA 10p/m w/ L->R and R->R collats;  b. 02/2018               CABG x 3: LIMA to LAD, SVG to OM2, SVG to PDA, EVH via  right thigh. No date: CKD (chronic kidney disease), stage III (HCC) No date: Diverticulitis No date: Essential hypertension No date: GERD (gastroesophageal reflux disease) No date: Hemorrhoids No date: History of chicken pox 06/2007: History of Helicobacter pylori infection No date: Hyperlipidemia No date: Hypertension No date: Left renal artery stenosis     Comment:  a. 01/2017 RA u/s: moderate L RAS. No date: Lower extremity edema No date: Nonrheumatic mitral (valve) insufficiency     Comment:  a. 12/2016 Echo: mild to mod MR; b. 09/2017 TEE: mild to               mod MR; c. 04/2018 Echo: Mild MR. 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 s/p 21 mm Edwards Inspiris Resilia stented bovine               pericardial tissue valve and 24 mm Gelweave Valsalva               synthetic root conduit with reimplantation of left main               coronary artery.   Reproductive/Obstetrics negative OB ROS                              Anesthesia Physical Anesthesia Plan  ASA: 3  Anesthesia Plan: General   Post-op Pain Management: Minimal or no pain anticipated   Induction: Intravenous  PONV Risk Score and Plan: 2 and Propofol  infusion, TIVA and Treatment may vary due to age or medical condition  Airway Management Planned: Natural Airway and Nasal Cannula  Additional Equipment: None  Intra-op Plan:   Post-operative Plan:   Informed Consent: I have reviewed the patients History and Physical, chart, labs and discussed the procedure including the risks, benefits and alternatives for the proposed anesthesia with the patient or authorized representative who has indicated his/her understanding and acceptance.     Dental Advisory Given  Plan Discussed with: Anesthesiologist, CRNA and Surgeon  Anesthesia Plan Comments: (Discussed risks of anesthesia with patient, including possibility of difficulty with spontaneous ventilation under anesthesia necessitating airway intervention, PONV, and rare risks such as cardiac or respiratory or neurological events, and allergic reactions. Discussed the role of CRNA in patient's perioperative care. Patient understands.)         Anesthesia Quick Evaluation

## 2024-11-17 NOTE — Transfer of Care (Signed)
 Immediate Anesthesia Transfer of Care Note  Patient: Rodney Wiley  Procedure(s) Performed: COLONOSCOPY  Patient Location: PACU and Endoscopy Unit  Anesthesia Type:General  Level of Consciousness: awake, alert , and oriented  Airway & Oxygen Therapy: Patient Spontanous Breathing  Post-op Assessment: Report given to RN  Post vital signs: Reviewed and stable  Last Vitals:  Vitals Value Taken Time  BP    Temp    Pulse    Resp    SpO2      Last Pain:  Vitals:   11/17/24 0823  TempSrc: Temporal  PainSc:          Complications: No notable events documented.

## 2024-11-17 NOTE — Op Note (Signed)
 [facility] Gastroenterology Patient Name: Rodney Wiley Procedure Date: 11/17/2024 7:59 AM MRN: [id number] Account #: [id number] Date of Birth: 02-Feb-1936 Admit Type: Inpatient Age: 88 Room: [facility] ENDO ROOM 4 Gender: Male Note Status: Finalized Instrument Name: Colon Scope [id number] Procedure:             Colonoscopy Indications:           Melena Providers:             Ole Schick MD, MD Medicines:             Monitored Anesthesia Care Complications:         No immediate complications. Procedure:             Pre-Anesthesia Assessment:                        - Prior to the procedure, a History and Physical was                         performed, and patient medications and allergies were                         reviewed. The patient is competent. The risks and                         benefits of the procedure and the sedation options and                         risks were discussed with the patient. All questions                         were answered and informed consent was obtained.                         Patient identification and proposed procedure were                         verified by the physician, the nurse, the                         anesthesiologist and the technician in the endoscopy                         suite. Mental Status Examination: alert and oriented.                         Airway Examination: normal oropharyngeal airway and                         neck mobility. Respiratory Examination: clear to                         auscultation. CV Examination: normal. Prophylactic                         Antibiotics: The patient does not require prophylactic                         antibiotics. Prior Anticoagulants: The patient has  taken no anticoagulant or antiplatelet agents. ASA                         Grade Assessment: III - A patient with severe systemic                         disease. After reviewing the risks and  benefits, the                         patient was deemed in satisfactory condition to                         undergo the procedure. The anesthesia plan was to use                         monitored anesthesia care (MAC). Immediately prior to                         administration of medications, the patient was                         re-assessed for adequacy to receive sedatives. The                         heart rate, respiratory rate, oxygen saturations,                         blood pressure, adequacy of pulmonary ventilation, and                         response to care were monitored throughout the                         procedure. The physical status of the patient was                         re-assessed after the procedure.                        After obtaining informed consent, the colonoscope was                         passed under direct vision. Throughout the procedure,                         the patient's blood pressure, pulse, and oxygen                         saturations were monitored continuously. The                         Colonoscope was introduced through the anus and                         advanced to the the terminal ileum, with                         identification of the appendiceal orifice and IC  valve. The colonoscopy was performed without                         difficulty. The patient tolerated the procedure well.                         The quality of the bowel preparation was poor. The                         terminal ileum, ileocecal valve, appendiceal orifice,                         and rectum were photographed. Findings:      The perianal and digital rectal examinations were normal.      The terminal ileum appeared normal.      A 5 mm polyp was found in the ascending colon. The polyp was sessile.       Polypectomy was not attempted.      Many large-mouthed and small-mouthed diverticula were found in the       sigmoid colon  and descending colon.      Internal hemorrhoids were found during retroflexion. The hemorrhoids       were Grade I (internal hemorrhoids that do not prolapse).      The exam was otherwise without abnormality on direct and retroflexion       views. Impression:            - Preparation of the colon was poor.                        - The examined portion of the ileum was normal.                        - One 5 mm polyp in the ascending colon. Resection not                         attempted.                        - Diverticulosis in the sigmoid colon and in the                         descending colon.                        - Internal hemorrhoids.                        - The examination was otherwise normal on direct and                         retroflexion views.                        - No specimens collected. Recommendation:        - Return patient to hospital ward for ongoing care.                        - Advance diet as tolerated.                        -  Could consider VCE as an outpatient vs CT                         enterography if benefits outweigh risks. Procedure Code(s):     --- Professional ---                        [phone], Colonoscopy, flexible; diagnostic, including                         collection of specimen(s) by brushing or washing, when                         performed (separate procedure) Diagnosis Code(s):     --- Professional ---                        K64.0, First degree hemorrhoids                        D12.2, Benign neoplasm of ascending colon                        K92.1, Melena (includes Hematochezia)                        K57.30, Diverticulosis of large intestine without                         perforation or abscess without bleeding CPT copyright 2022 American [employer] Association. All rights reserved. The codes documented in this report are preliminary and upon coder review may  be revised to meet current compliance requirements. Ole Schick MD,  MD 11/17/2024 9:27:05 AM Number of Addenda: 0 Note Initiated On: 11/17/2024 7:59 AM Scope Withdrawal Time: 0 hours 4 minutes 4 seconds  Total Procedure Duration: 0 hours 8 minutes 46 seconds  Estimated Blood Loss:  Estimated blood loss: none.      [facility]

## 2024-11-17 NOTE — Anesthesia Postprocedure Evaluation (Signed)
 Anesthesia Post Note  Patient: Rodney Wiley  Procedure(s) Performed: COLONOSCOPY  Patient location during evaluation: Endoscopy Anesthesia Type: General Level of consciousness: awake and alert Pain management: pain level controlled Vital Signs Assessment: post-procedure vital signs reviewed and stable Respiratory status: spontaneous breathing, nonlabored ventilation, respiratory function stable and patient connected to nasal cannula oxygen Cardiovascular status: blood pressure returned to baseline and stable Postop Assessment: no apparent nausea or vomiting Anesthetic complications: no   No notable events documented.   Last Vitals:  Vitals:   11/17/24 0943 11/17/24 1030  BP: 129/66 (!) 176/85  Pulse:  81  Resp:  16  Temp:  36.5 C  SpO2:  100%    Last Pain:  Vitals:   11/17/24 0823  TempSrc: Temporal  PainSc:                  Debby Mines

## 2024-11-17 NOTE — Plan of Care (Signed)
  Problem: Clinical Measurements: Goal: Ability to maintain clinical measurements within normal limits will improve Outcome: Progressing   Problem: Safety: Goal: Ability to remain free from injury will improve Outcome: Progressing   Problem: Elimination: Goal: Will not experience complications related to bowel motility Outcome: Progressing

## 2024-11-17 NOTE — Progress Notes (Signed)
 Progress Note   Patient: Rodney Wiley FMW:[id number] DOB: 1936/11/26 DOA: 11/15/2024     0 DOS: the patient was seen and examined on 11/17/2024   Brief hospital course: HPI: Rodney Wiley is a 88 y.o. male with medical history significant of AS s/p valve replacement, recent GI bleed (thought to be diverticular 08/2024), BPH s/p suprapubic catheter,  CKD stage 3b, and HTN.    He presents to the ED after having dark stools for the past 2 weeks.  He states that since his dark stool started there occurred every couple days and he will sometimes have bowel movements a day.  He had a large black bowel movement 6 days prior to admission and subsequently had a syncopal episode.  His last bowel movement was day prior to admission and this morning normal in color but continues to have black specks.  His family encouraged him to come to the ED since his syncopal episode 6 days ago-patient refused until today. Patient has also noted some general malaise and occasional mental confusion.    He states he last took his aspirin  08/2024 after hospitalization for LGIB. He takes Advil occasionally but last took this about a week ago.  He had a colonoscopy 08/16/2024 which showed multiple medium mouthed diverticula in the L colon and non bleeding external hemorrhoids. Upper endoscopy notable for large hiatal hernia, and few localized erosions with no bleeding or stagmata of recent bleeding in the gastric antrum.    He denies BRBPR, chest pain, dyspnea, abdominal pain, fever, or chills.    ED: Patient was noted to be hypotensive, NL HR, and 100% O2 sat on RA. Labs were notable for stably elevated scr, bicarb 19, and hgb 5.8. Xray of L knee, forearm and shoulder were obtained given fall 6 d ago and were negative for acute fracture.    He was transfused 2u of pRBCs.   Assessment and Plan: GIB  S/p 2u of PRBCs.  Hemoglobin stable.  EGD with grade C esophagitis with no bleeding, nonbleeding duodenal diverticulum.   Colonoscopy 12/13 one 5 mm polyp in ascending colon, diverticulosis in the sigmoid and descending colon, internal hemorrhoids.  Both studies with no culprit for his bleeding.  Per GI, VCE vs ct enteropathy  can be pursued outpatient. - Plan for discharge in a.m. if hemoglobin remains stable and no further bleeding  Iron  deficiency anemia Hemoglobin improved with 2 units of packed red blood cells Iron /TIBC/Ferritin/ %Sat    Component Value Date/Time   IRON  35 (L) 11/17/2024 0402   TIBC 231 (L) 11/17/2024 0402   FERRITIN 173 11/17/2024 0402   IRONPCTSAT 15 (L) 11/17/2024 0402   Will need to continue iron  supplementation  CKD3b  Appears at baseline.   NAGMA resolving Will continue to monitor.   Acute non ischemic injury : Elevated to 10 8 repeat 100.  Likely in the setting of his anemia.  Patient had malaise but no chest pain or dyspnea.  EKG with no findings concerning for acute ischemia. No further cardiac while inpatient  Protein calorie malnutrition BMI 20 Ensures when able  Chronic problems Hypertension Hyperlipidemia BPH status post suprapubic catheter HFpEF Restart home medications as able      Subjective: No issues overnight, denies any black stool or bright red stool with prep for colonoscopy.  Physical Exam: Vitals:   11/17/24 0925 11/17/24 0943 11/17/24 1030 11/17/24 1740  BP: (!) 86/57 129/66 (!) 176/85 (!) 152/91  Pulse:   81 91  Resp:  16 16  Temp:   97.7 F (36.5 C) (!) 97.4 F (36.3 C)  TempSrc:      SpO2:   100% 100%  Weight:      Height:         Physical Exam  Constitutional: In no distress. Chronically ill appearing. Cardiovascular: Normal rate, regular rhythm.  Systolic murmur throughout precordium no lower extremity edema  Pulmonary: Non labored breathing on room air, no wheezing or rales.   Abdominal: Soft. Non distended and non tender, suprapubic catheter in place Musculoskeletal: Normal range of motion.     Neurological: Alert and  oriented to person, place, and time. Non focal  Skin: Skin is warm and dry.   Data Reviewed:     Latest Ref Rng & Units 11/17/2024    4:02 AM 11/16/2024    5:05 AM 11/15/2024   11:17 AM  CBC  WBC 4.0 - 10.5 K/uL 7.3  4.8  5.4   Hemoglobin 13.0 - 17.0 g/dL 9.0  7.7  5.8   Hematocrit 39.0 - 52.0 % 27.5  23.4  18.5   Platelets 150 - 400 K/uL 221  180  187       Latest Ref Rng & Units 11/17/2024    4:02 AM 11/16/2024    5:05 AM 11/15/2024   11:17 AM  BMP  Glucose 70 - 99 mg/dL 85  82  878   BUN 8 - 23 mg/dL 24  32  39   Creatinine 0.61 - 1.24 mg/dL 8.48  8.31  8.20   Sodium 135 - 145 mmol/L 141  138  138   Potassium 3.5 - 5.1 mmol/L 4.7  4.7  4.6   Chloride 98 - 111 mmol/L 109  111  107   CO2 22 - 32 mmol/L 21  19  19    Calcium  8.9 - 10.3 mg/dL 8.2  8.4  8.3      Family Communication: None at bedside.   Disposition: Status is: Observation The patient remains OBS appropriate and will d/c before 2 midnights.  Planned Discharge Destination: Home    Time spent: 35 minutes  Author: Alban Pepper, MD 11/17/2024 6:16 PM  For on call review [email or web address].

## 2024-11-18 LAB — CBC
HCT: 28.1 % — ABNORMAL LOW (ref 39.0–52.0)
Hemoglobin: 9.3 g/dL — ABNORMAL LOW (ref 13.0–17.0)
MCH: 32 pg (ref 26.0–34.0)
MCHC: 33.1 g/dL (ref 30.0–36.0)
MCV: 96.6 fL (ref 80.0–100.0)
Platelets: 218 K/uL (ref 150–400)
RBC: 2.91 MIL/uL — ABNORMAL LOW (ref 4.22–5.81)
RDW: 15.8 % — ABNORMAL HIGH (ref 11.5–15.5)
WBC: 5.7 K/uL (ref 4.0–10.5)
nRBC: 0 % (ref 0.0–0.2)

## 2024-11-18 LAB — BASIC METABOLIC PANEL WITH GFR
Anion gap: 11 (ref 5–15)
BUN: 18 mg/dL (ref 8–23)
CO2: 20 mmol/L — ABNORMAL LOW (ref 22–32)
Calcium: 8.5 mg/dL — ABNORMAL LOW (ref 8.9–10.3)
Chloride: 108 mmol/L (ref 98–111)
Creatinine, Ser: 1.51 mg/dL — ABNORMAL HIGH (ref 0.61–1.24)
GFR, Estimated: 45 mL/min — ABNORMAL LOW (ref >60)
Glucose, Bld: 83 mg/dL (ref 70–99)
Potassium: 4.6 mmol/L (ref 3.5–5.1)
Sodium: 139 mmol/L (ref 135–145)

## 2024-11-18 MED ORDER — FERROUS SULFATE 325 (65 FE) MG PO TBEC: 325.0000 mg | DELAYED_RELEASE_TABLET | Freq: Every day | ORAL | 0 refills | Status: AC

## 2024-11-18 MED ORDER — PANTOPRAZOLE SODIUM 40 MG PO TBEC: 40.0000 mg | DELAYED_RELEASE_TABLET | Freq: Every day | ORAL | 0 refills | Status: AC

## 2024-11-18 NOTE — Discharge Instructions (Signed)
 Please have your hemoglobin drawn in 1 week.   Please schedule an appointment with Dr. Calton office in 1-2 weeks to see if patient needs capsule endoscopy.   Please take your iron  pill daily, as well as a stool softener.   Please discuss with your pharmacist when to take your protonix  in relation to your iron  pills.

## 2024-11-18 NOTE — Plan of Care (Signed)
  Problem: Clinical Measurements: Goal: Ability to maintain clinical measurements within normal limits will improve Outcome: Progressing   Problem: Elimination: Goal: Will not experience complications related to bowel motility Outcome: Progressing   Problem: Safety: Goal: Ability to remain free from injury will improve Outcome: Progressing   

## 2024-11-18 NOTE — Plan of Care (Signed)

## 2024-11-19 ENCOUNTER — Inpatient Hospital Stay: Admitting: Oncology

## 2024-11-19 ENCOUNTER — Inpatient Hospital Stay

## 2024-11-19 ENCOUNTER — Telehealth: Payer: Self-pay | Admitting: Oncology

## 2024-11-19 NOTE — Telephone Encounter (Signed)
 Patients daughter in law called to reschedule appointments from today. I have reschedule them to Friday. She said he has been in the hospital and just got out last night. She said the injections were stopped so I cancelled the appointment for that. But did reschedule the lab/md for Friday.  Thank you

## 2024-11-19 NOTE — Discharge Summary (Signed)
 Physician Discharge Summary   Patient: Rodney Wiley MRN: [id number] DOB: 1936-06-06  Admit date:     11/15/2024  Discharge date: 11/18/2024  Discharge Physician: Alban Pepper MD   PCP: Donal Channing SQUIBB, FNP   Recommendations at discharge:    Repeat cbc to ensure stable in 1 week Will need iron  studies repeated    Discharge Diagnoses: Principal Problem:   UGIB (upper gastrointestinal bleed) Active Problems:   Melena  Resolved Problems:   * No resolved hospital problems. [facility] Course: Per H&P HPI  HPI: Rodney Wiley is a 88 y.o. male with medical history significant of AS s/p valve replacement, recent GI bleed (thought to be diverticular 08/2024), BPH s/p suprapubic catheter,  CKD stage 3b, and HTN.    He presents to the ED after having dark stools for the past 2 weeks.  He states that since his dark stool started there occurred every couple days and he will sometimes have bowel movements a day.  He had a large black bowel movement 6 days prior to admission and subsequently had a syncopal episode.  His last bowel movement was day prior to admission and this morning normal in color but continues to have black specks.  His family encouraged him to come to the ED since his syncopal episode 6 days ago-patient refused until today. Patient has also noted some general malaise and occasional mental confusion.    He states he last took his aspirin  08/2024 after hospitalization for LGIB. He takes Advil occasionally but last took this about a week ago.  He had a colonoscopy 08/16/2024 which showed multiple medium mouthed diverticula in the L colon and non bleeding external hemorrhoids. Upper endoscopy notable for large hiatal hernia, and few localized erosions with no bleeding or stagmata of recent bleeding in the gastric antrum.    He denies BRBPR, chest pain, dyspnea, abdominal pain, fever, or chills.    ED: Patient was noted to be hypotensive, NL HR, and 100% O2 sat on RA. Labs  were notable for stably elevated scr, bicarb 19, and hgb 5.8. Xray of L knee, forearm and shoulder were obtained given fall 6 d ago and were negative for acute fracture.    He was transfused 2u of pRBCs. HPI: Rodney Wiley is a 88 y.o. male with medical history significant of AS s/p valve replacement, recent GI bleed (thought to be diverticular 08/2024), BPH s/p suprapubic catheter,  CKD stage 3b, and HTN.    He presents to the ED after having dark stools for the past 2 weeks.  He states that since his dark stool started there occurred every couple days and he will sometimes have bowel movements a day.  He had a large black bowel movement 6 days prior to admission and subsequently had a syncopal episode.  His last bowel movement was day prior to admission and this morning normal in color but continues to have black specks.  His family encouraged him to come to the ED since his syncopal episode 6 days ago-patient refused until today. Patient has also noted some general malaise and occasional mental confusion.    He states he last took his aspirin  08/2024 after hospitalization for LGIB. He takes Advil occasionally but last took this about a week ago.  He had a colonoscopy 08/16/2024 which showed multiple medium mouthed diverticula in the L colon and non bleeding external hemorrhoids. Upper endoscopy notable for large hiatal hernia, and few localized erosions with no bleeding or stagmata  of recent bleeding in the gastric antrum.    He denies BRBPR, chest pain, dyspnea, abdominal pain, fever, or chills.    ED: Patient was noted to be hypotensive, NL HR, and 100% O2 sat on RA. Labs were notable for stably elevated scr, bicarb 19, and hgb 5.8. Xray of L knee, forearm and shoulder were obtained given fall 6 d ago and were negative for acute fracture.    He was transfused 2u of pRBCs.   Assessment and Plan: GIB  Melena  Likely UGIB. S/p 2u of PRBCs.  Hemoglobin stable.  EGD with grade C esophagitis with no  bleeding, nonbleeding duodenal diverticulum.  Colonoscopy 12/13 one 5 mm polyp in ascending colon, diverticulosis in the sigmoid and descending colon, internal hemorrhoids.  Both studies with no culprit for his bleeding.  Per GI, VCE vs ct enteropathy  can be pursued outpatient. No further bleeding noted.    Iron  deficiency anemia Acute Blood loss anemia  Hemoglobin improved with 2 units of packed red blood cells and remained stable.  Iron /TIBC/Ferritin/ %Sat Labs (Brief)          Component Value Date/Time    IRON  35 (L) 11/17/2024 0402    TIBC 231 (L) 11/17/2024 0402    FERRITIN 173 11/17/2024 0402    IRONPCTSAT 15 (L) 11/17/2024 0402         Latest Ref Rng & Units 11/18/2024    4:40 AM 11/17/2024    4:02 AM 11/16/2024    5:05 AM  CBC  WBC 4.0 - 10.5 K/uL 5.7  7.3  4.8   Hemoglobin 13.0 - 17.0 g/dL 9.3  9.0  7.7   Hematocrit 39.0 - 52.0 % 28.1  27.5  23.4   Platelets 150 - 400 K/uL 218  221  180   Iron  levels low. Will continue PO iron  and he will need outpatient follow up for this.    CKD3b  Scr improved. Unclear baseline. Discharged at baseline.    NAGMA mild in setting of CKD    Latest Ref Rng & Units 11/18/2024    4:40 AM 11/17/2024    4:02 AM 11/16/2024    5:05 AM  BMP  Glucose 70 - 99 mg/dL 83  85  82   BUN 8 - 23 mg/dL 18  24  32   Creatinine 0.61 - 1.24 mg/dL 8.48  8.48  8.31   Sodium 135 - 145 mmol/L 139  141  138   Potassium 3.5 - 5.1 mmol/L 4.6  4.7  4.7   Chloride 98 - 111 mmol/L 108  109  111   CO2 22 - 32 mmol/L 20  21  19    Calcium  8.9 - 10.3 mg/dL 8.5  8.2  8.4       Acute non ischemic injury Troponin Elevated to 108 repeat 100.  Likely in the setting of his anemia.  Patient had malaise but no chest pain or dyspnea.  EKG with no findings concerning for acute ischemia. No further cardiac w/u while inpatient   Protein calorie malnutrition BMI 20 Ensures when able   Chronic problems Hypertension Hyperlipidemia BPH status post suprapubic  catheter HFpEF Restart home medications as able         Consultants: GI Procedures performed: colonoscopy, EGD  Disposition: Home Diet recommendation:  Discharge Diet Orders (From admission, onward)     Start     Ordered   11/18/24 0000  Diet general        11/18/24 1334  Regular diet DISCHARGE MEDICATION: Allergies as of 11/18/2024   No Known Allergies      Medication List     PAUSE taking these medications    aspirin  EC 81 MG tablet Wait to take this until your doctor or other care provider tells you to start again. Take 81 mg by mouth daily.       TAKE these medications    amLODipine  5 MG tablet Commonly known as: NORVASC  Take 1 tablet (5 mg total) by mouth 2 (two) times daily.   atorvastatin  40 MG tablet Commonly known as: LIPITOR Take 1 tablet (40 mg total) by mouth daily.   cholecalciferol  25 MCG (1000 UNIT) tablet Commonly known as: VITAMIN D3 Take 1,000 Units by mouth daily.   ferrous sulfate  325 (65 FE) MG EC tablet Take 1 tablet (325 mg total) by mouth daily with breakfast.   isosorbide  mononitrate 60 MG 24 hr tablet Commonly known as: IMDUR  Take 60 mg by mouth daily.   lisinopril  5 MG tablet Commonly known as: ZESTRIL  Take 2 tablets (10 mg total) by mouth daily.   pantoprazole  40 MG tablet Commonly known as: Protonix  Take 1 tablet (40 mg total) by mouth daily.   polyethylene glycol 17 g packet Commonly known as: MiraLax  Take 17 g by mouth daily.   tamsulosin  0.4 MG Caps capsule Commonly known as: FLOMAX  Take 1 capsule (0.4 mg total) by mouth daily.   torsemide  10 MG tablet Commonly known as: DEMADEX  TAKE 1 TABLET BY MOUTH EVERY DAY AS NEEDED   traMADol  50 MG tablet Commonly known as: ULTRAM  Take 50 mg by mouth every 6 (six) hours as needed.        Follow-up Information     Maryruth Ole DASEN, MD. Schedule an appointment as soon as possible for a visit in 1 week(s).   Specialty: Gastroenterology Why:  call for appointment 1-2 weeks. Contact information: [address] [place] [address] [phone]         Donal Channing SQUIBB, FNP. Schedule an appointment as soon as possible for a visit.   Specialty: Family Medicine Contact information: [address] [place] [address] [phone]                Discharge Exam: Fredricka Weights   11/15/24 1109  Weight: 59 kg   Physical Exam  Constitutional: In no distress. Thin Cardiovascular: Normal rate, regular rhythm. Systolic murmur throughout precordium  No lower extremity edema  Pulmonary: Non labored breathing on room air, no wheezing or rales.   Abdominal: Soft. Non distended and non tender Musculoskeletal: Normal range of motion.     Neurological: Alert and oriented to person, place, and time. Non focal  Skin: Skin is warm and dry.    Condition at discharge: stable  The results of significant diagnostics from this hospitalization (including imaging, microbiology, ancillary and laboratory) are listed below for reference.   Imaging Studies: DG Knee Complete 4 Views Left Result Date: 11/15/2024 CLINICAL DATA:  Pain.  Recent fall. EXAM: LEFT KNEE - COMPLETE 4+ VIEW COMPARISON:  None Available. FINDINGS: No acute fracture or dislocation. Small joint effusion. Lateral and patellofemoral compartment predominant moderate osteoarthritis with joint space narrowing. Peripheral vascular calcifications are noted. IMPRESSION: 1. No acute osseous abnormality. 2. Moderate tricompartmental osteoarthritis with small joint effusion. Electronically Signed   By: Harrietta Sherry M.D.   On: 11/15/2024 13:46   DG Chest 1 View Result Date: 11/15/2024 CLINICAL DATA:  Pain.  Recent fall. EXAM: CHEST  1 VIEW COMPARISON:  09/18/2024. FINDINGS: The heart size and mediastinal contours are within normal limits. Aortic atherosclerosis. 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 Median sternotomy. No focal consolidation, pleural effusion, or  pneumothorax. No acute osseous abnormality. IMPRESSION: No acute cardiopulmonary findings. Electronically Signed   By: Harrietta Sherry M.D.   On: 11/15/2024 13:45   DG Forearm Left Result Date: 11/15/2024 CLINICAL DATA:  Pain.  Recent fall. EXAM: LEFT FOREARM - 2 VIEW COMPARISON:  None Available. FINDINGS: No evidence of acute fracture. Well corticated ossicle along the superior aspect of the radiocapitellar joint may relate to prior trauma. Soft tissues are unremarkable. IMPRESSION: No acute osseous abnormality. Electronically Signed   By: Harrietta Sherry M.D.   On: 11/15/2024 13:42   DG Shoulder Left Result Date: 11/15/2024 CLINICAL DATA:  Status post fall with left shoulder pain EXAM: LEFT SHOULDER - 3 VIEW COMPARISON:  None Available. FINDINGS: There is no evidence of fracture or dislocation. Mild superior subluxation of the humeral head relative to the glenoid. Moderate degenerative changes of the glenohumeral and acromioclavicular joints. Soft tissues are unremarkable. Radiopaque device projects over the upper lateral left chest. Median sternotomy wires are nondisplaced. IMPRESSION: 1. No acute fracture or dislocation. 2. Moderate degenerative changes of the shoulder. Mild superior subluxation of the humeral head relative to the glenoid, which can be seen in the setting of rotator cuff pathology. Electronically Signed   By: Limin  Xu M.D.   On: 11/15/2024 12:19    Microbiology: Results for orders placed or performed in visit on 09/13/24  Urine Culture     Status: Abnormal   Collection Time: 09/13/24  2:37 PM   Specimen: Urine, Clean Catch  Result Value Ref Range Status   Specimen Description   Final    URINE, CLEAN CATCH Performed at [facility], [address] [place], [place] [address]    Special Requests   Final    NONE Performed at [facility], [address] [place], [place] [address]    Culture MULTIPLE SPECIES PRESENT, SUGGEST RECOLLECTION (A)  Final    Report Status 09/14/2024 FINAL  Final    Labs: CBC: Recent Labs  Lab 11/15/24 1117 11/16/24 0505 11/17/24 0402 11/18/24 0440  WBC 5.4 4.8 7.3 5.7  HGB 5.8* 7.7* 9.0* 9.3*  HCT 18.5* 23.4* 27.5* 28.1*  MCV 101.1* 94.0 95.2 96.6  PLT 187 180 221 218   Basic Metabolic Panel: Recent Labs  Lab 11/15/24 1117 11/16/24 0505 11/17/24 0402 11/18/24 0440  NA 138 138 141 139  K 4.6 4.7 4.7 4.6  CL 107 111 109 108  CO2 19* 19* 21* 20*  GLUCOSE 121* 82 85 83  BUN 39* 32* 24* 18  CREATININE 1.79* 1.68* 1.51* 1.51*  CALCIUM  8.3* 8.4* 8.2* 8.5*   Liver Function Tests: Recent Labs  Lab 11/15/24 1117 11/16/24 0505  AST 16 13*  ALT 7 7  ALKPHOS 61 58  BILITOT 0.3 0.6  PROT 6.0* 5.6*  ALBUMIN  3.4* 3.2*   CBG: Recent Labs  Lab 11/15/24 1116  GLUCAP 105*    Discharge time spent: greater than 30 minutes.  Signed: Alban Pepper, MD [facility] 11/19/2024

## 2024-11-20 ENCOUNTER — Encounter: Payer: Self-pay | Admitting: Gastroenterology

## 2024-11-23 ENCOUNTER — Inpatient Hospital Stay: Admitting: Oncology

## 2024-11-23 ENCOUNTER — Inpatient Hospital Stay

## 2024-11-23 VITALS — BP 149/67 | HR 69 | Temp 98.7°F | Resp 18 | Ht 67.0 in | Wt 134.9 lb

## 2024-11-23 DIAGNOSIS — D508 Other iron deficiency anemias: Secondary | ICD-10-CM

## 2024-11-23 DIAGNOSIS — Z79899 Other long term (current) drug therapy: Secondary | ICD-10-CM

## 2024-11-23 DIAGNOSIS — D649 Anemia, unspecified: Secondary | ICD-10-CM | POA: Diagnosis not present

## 2024-11-23 DIAGNOSIS — D631 Anemia in chronic kidney disease: Secondary | ICD-10-CM | POA: Diagnosis not present

## 2024-11-23 DIAGNOSIS — D500 Iron deficiency anemia secondary to blood loss (chronic): Secondary | ICD-10-CM | POA: Diagnosis present

## 2024-11-23 DIAGNOSIS — N184 Chronic kidney disease, stage 4 (severe): Secondary | ICD-10-CM

## 2024-11-23 DIAGNOSIS — I129 Hypertensive chronic kidney disease with stage 1 through stage 4 chronic kidney disease, or unspecified chronic kidney disease: Secondary | ICD-10-CM | POA: Diagnosis present

## 2024-11-23 LAB — CBC (CANCER CENTER ONLY)
HCT: 30.3 % — ABNORMAL LOW (ref 39.0–52.0)
Hemoglobin: 9.6 g/dL — ABNORMAL LOW (ref 13.0–17.0)
MCH: 31 pg (ref 26.0–34.0)
MCHC: 31.7 g/dL (ref 30.0–36.0)
MCV: 97.7 fL (ref 80.0–100.0)
Platelet Count: 173 K/uL (ref 150–400)
RBC: 3.1 MIL/uL — ABNORMAL LOW (ref 4.22–5.81)
RDW: 15.8 % — ABNORMAL HIGH (ref 11.5–15.5)
WBC Count: 8.6 K/uL (ref 4.0–10.5)
nRBC: 0 % (ref 0.0–0.2)

## 2024-11-23 MED ORDER — AMOXICILLIN-POT CLAVULANATE 875-125 MG PO TABS: 1.0000 | ORAL_TABLET | Freq: Two times a day (BID) | ORAL | 0 refills | Status: AC

## 2024-11-23 MED ORDER — DARBEPOETIN ALFA 40 MCG/0.4ML IJ SOSY
80.0000 ug | PREFILLED_SYRINGE | Freq: Once | INTRAMUSCULAR | Status: AC
  Administered 2024-11-23: 80 ug via SUBCUTANEOUS
  Filled 2024-11-23: qty 0.8

## 2024-11-23 NOTE — Progress Notes (Unsigned)
 Patient has swelling in right hand & arm that is very painful that started last night. He also recently was seen in the ED for hemoglobin of 5.8; received blood transfusion in ED.

## 2024-11-23 NOTE — Progress Notes (Unsigned)
 "    Hematology/Oncology Consult note [facility]  [phone] [phone]  Patient Care Team: Donal Channing SQUIBB, FNP as PCP - General (Family Medicine) End, Lonni, MD as PCP - Cardiology (Cardiology) Cindie Ole DASEN, MD as PCP - Electrophysiology (Cardiology) Melanee Annah BROCKS, MD as Consulting Physician (Oncology)   Name of the patient: Rodney Wiley  [id number]  18-Apr-1936   Date of visit: 11/23/2024  Diagnosis- anemia of chronic kidney disease    Chief complaint/ Reason for visit- routine f/u of anemia of ckd  Heme/Onc history: patient is a 88 year old male with a past medical history significant for hypertension, hyperlipidemia, stage III CKD and history of aortic insufficiency as well as ascending aortic aneurysm.  He recently underwent bioprosthetic aortic valve replacement, CABG x3 and resuspension of the left atrium on 02/22/2018.    Results of blood work in June 2021 were consistent with iron  deficiency along with CKD. He received venofer  in July 2021.  He has not required any EPO injections so far   Patient's hemoglobin was stable between 10-11 up until August 2024.  He had 2 back-to-back hospitalizations for syncope, hyponatremia and nausea vomiting.  Following that his hemoglobin has been around 8.  Patient was started on Aranesp  and presently gets it every 2 weeks.      Interval history- ***  ECOG PS- *** Pain scale- *** Opioid associated constipation- ***  Review of systems- ROS    Allergies[1]   Past Medical History:  Diagnosis Date   Anemia    Aortic insufficiency    a. 02/2018 s/p bioprosthetic AVR; 04/2018 Echo: EF 50-55%, Gr2 DD, Ao bioprosthesis, mean grad , Ao root/Asc Ao nl in size, mild MR, mildly dil LA, nl RV fxn. Nl PASP.   Arthropathy    Ascending aortic aneurysm    a. 12/2016 MRA: 4.3cm @ sinus of valsalva, 4.9cm above sinotubular jxn; b. 02/2018 s/p biological Bentall and AVR.   BPH (benign  prostatic hyperplasia)    CAD S/P CABG x 3 02/22/2018   a. 02/2018 Cath: LAD 20ost, 40p, 71m, LCX 60p, OM2 60, OM3 85, RCA 10p/m w/ L->R and R->R collats;  b. 02/2018 CABG x 3: LIMA to LAD, SVG to OM2, SVG to PDA, EVH via right thigh.   CKD (chronic kidney disease), stage III (HCC)    Diverticulitis    Essential hypertension    GERD (gastroesophageal reflux disease)    Hemorrhoids    History of chicken pox    History of Helicobacter pylori infection 06/2007   Hyperlipidemia    Hypertension    Left renal artery stenosis    a. 01/2017 RA u/s: moderate L RAS.   Lower extremity edema    Nonrheumatic mitral (valve) insufficiency    a. 12/2016 Echo: mild to mod MR; b. 09/2017 TEE: mild to mod MR; c. 04/2018 Echo: Mild MR.   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 s/p 21 mm Edwards Inspiris Resilia stented bovine pericardial tissue valve and 24 mm Gelweave Valsalva synthetic root conduit with reimplantation of left main coronary artery.     Past Surgical History:  Procedure Laterality Date   AORTIC VALVE REPAIR N/A 02/22/2018   Procedure: AORTIC VALVE REPLACEMENT -Biological Bentall aortic root replacement,;  Surgeon: Dusty Sudie DEL, MD;  Location: [facility] OR;  Service: Open Heart Surgery;  Laterality: N/A;   bypass surgery  CARDIAC CATHETERIZATION     01/16/2018   COLONOSCOPY  2011   by Dr. Ora with findings of diverticulosis   COLONOSCOPY N/A 11/17/2024   Procedure: COLONOSCOPY;  Surgeon: Maryruth Ole DASEN, MD;  Location: [facility] ENDOSCOPY;  Service: Endoscopy;  Laterality: N/A;   CORONARY ARTERY BYPASS GRAFT N/A 02/22/2018   Procedure: CORONARY ARTERY BYPASS GRAFTING (CABG) x three, using left internal mammary artery and right leg greater saphenous vein harvested endoscopically;  Surgeon: Dusty Sudie DEL, MD;  Location: [facility] OR;  Service: Open Heart Surgery;  Laterality: N/A;    CORONARY/GRAFT ANGIOGRAPHY N/A 01/16/2018   Procedure: CORONARY/GRAFT ANGIOGRAPHY;  Surgeon: Mady Bruckner, MD;  Location: [facility] INVASIVE CV LAB;  Service: Cardiovascular;  Laterality: N/A;   ELBOW SURGERY Left    ESOPHAGOGASTRODUODENOSCOPY N/A 11/16/2024   Procedure: EGD (ESOPHAGOGASTRODUODENOSCOPY);  Surgeon: Jinny Carmine, MD;  Location: [facility] ENDOSCOPY;  Service: Endoscopy;  Laterality: N/A;   hemorhoidectomy     IR BONE MARROW BIOPSY & ASPIRATION  01/30/2024   IR CATHETER TUBE CHANGE  07/30/2024   IR CYSTOSTOMY TUBE PLACEMENT/BLADDER ASPIRATION  05/22/2024   RIGHT HEART CATH N/A 01/16/2018   Procedure: RIGHT HEART CATH;  Surgeon: Mady Bruckner, MD;  Location: [facility] INVASIVE CV LAB;  Service: Cardiovascular;  Laterality: N/A;   SHOULDER ARTHROSCOPY WITH ROTATOR CUFF REPAIR Right    SHOULDER SURGERY Right    TEE WITHOUT CARDIOVERSION N/A 09/27/2017   Procedure: TRANSESOPHAGEAL ECHOCARDIOGRAM (TEE);  Surgeon: Maranda Leim DEL, MD;  Location: [facility] ENDOSCOPY;  Service: Cardiovascular;  Laterality: N/A;   TEE WITHOUT CARDIOVERSION N/A 02/22/2018   Procedure: TRANSESOPHAGEAL ECHOCARDIOGRAM (TEE);  Surgeon: Dusty Sudie DEL, MD;  Location: [facility] OR;  Service: Open Heart Surgery;  Laterality: N/A;   THORACIC AORTIC ANEURYSM REPAIR N/A 02/22/2018   Procedure: THORACIC ASCENDING ANEURYSM REPAIR (AAA) Resection of ascending aorta aneurysm;  Surgeon: Dusty Sudie DEL, MD;  Location: [facility] OR;  Service: Open Heart Surgery;  Laterality: N/A;    Social History   Socioeconomic History   Marital status: Widowed    Spouse name: Not on file   Number of children: Not on file   Years of education: Not on file   Highest education level: Not on file  Occupational History   Not on file  Tobacco Use   Smoking status: Never   Smokeless tobacco: Never  Vaping Use   Vaping status: Never Used  Substance and Sexual Activity   Alcohol  use: No   Drug use: No   Sexual activity: Not Currently  Other Topics Concern    Not on file  Social History Narrative   Not on file   Social Drivers of Health   Tobacco Use: Low Risk (11/23/2024)   Patient History    Smoking Tobacco Use: Never    Smokeless Tobacco Use: Never    Passive Exposure: Not on file  Financial Resource Strain: Low Risk (08/15/2024)   Received from [facility]   Overall Financial Resource Strain (CARDIA)    How hard is it for you to pay for the very basics like food, housing, medical care, and heating?: Not very hard  Food Insecurity: No Food Insecurity (11/15/2024)   Epic    Worried About Radiation Protection Practitioner of Food in the Last Year: Never true    Ran Out of Food in the Last Year: Never true  Transportation Needs: No Transportation Needs (11/15/2024)   Epic    Lack of Transportation (Medical): No    Lack of Transportation (Non-Medical): No  Physical Activity:  Not on file  Stress: Not on file  Social Connections: Moderately Isolated (11/16/2024)   Social Connection and Isolation Panel    Frequency of Communication with Friends and Family: Three times a week    Frequency of Social Gatherings with Friends and Family: Once a week    Attends Religious Services: More than 4 times per year    Active Member of Clubs or Organizations: No    Attends Banker Meetings: Never    Marital Status: Divorced  Catering Manager Violence: Not At Risk (11/15/2024)   Epic    Fear of Current or Ex-Partner: No    Emotionally Abused: No    Physically Abused: No    Sexually Abused: No  Depression (PHQ2-9): Low Risk (11/23/2024)   Depression (PHQ2-9)    PHQ-2 Score: 0  Alcohol  Screen: Not on file  Housing: Low Risk (11/15/2024)   Epic    Unable to Pay for Housing in the Last Year: No    Number of Times Moved in the Last Year: 0    Homeless in the Last Year: No  Utilities: Not At Risk (11/15/2024)   Epic    Threatened with loss of utilities: No  Health Literacy: Not on file    Family History  Problem Relation Age of Onset    Hypertension Mother    Stroke Mother    Leukemia Father    Heart attack Paternal Grandmother    Stroke Paternal Grandfather    Heart Problems Son 54   Heart Problems Son 40   Uterine cancer Sister    Prostate cancer Brother    Colon cancer Brother    Kidney failure Brother    Stroke Sister     Current Medications[2]  Physical exam:  Vitals:   11/23/24 1429 11/23/24 1442  BP: (!) 141/84 (!) 149/67  Pulse: 70 69  Resp: 18   Temp: 98.7 F (37.1 C)   TempSrc: Tympanic   SpO2: 100% 99%  Weight: 134 lb 14.4 oz (61.2 kg)   Height: 5' 7 (1.702 m)    Physical Exam   I have personally reviewed labs listed below:    Latest Ref Rng & Units 11/18/2024    4:40 AM  CMP  Glucose 70 - 99 mg/dL 83   BUN 8 - 23 mg/dL 18   Creatinine 9.38 - 1.24 mg/dL 8.48   Sodium 864 - 854 mmol/L 139   Potassium 3.5 - 5.1 mmol/L 4.6   Chloride 98 - 111 mmol/L 108   CO2 22 - 32 mmol/L 20   Calcium  8.9 - 10.3 mg/dL 8.5       Latest Ref Rng & Units 11/23/2024    1:49 PM  CBC  WBC 4.0 - 10.5 K/uL 8.6   Hemoglobin 13.0 - 17.0 g/dL 9.6   Hematocrit 60.9 - 52.0 % 30.3   Platelets 150 - 400 K/uL 173    I have personally reviewed Radiology images listed below: No images are attached to the encounter.  DG Knee Complete 4 Views Left Result Date: 11/15/2024 CLINICAL DATA:  Pain.  Recent fall. EXAM: LEFT KNEE - COMPLETE 4+ VIEW COMPARISON:  None Available. FINDINGS: No acute fracture or dislocation. Small joint effusion. Lateral and patellofemoral compartment predominant moderate osteoarthritis with joint space narrowing. Peripheral vascular calcifications are noted. IMPRESSION: 1. No acute osseous abnormality. 2. Moderate tricompartmental osteoarthritis with small joint effusion. Electronically Signed   By: Harrietta Sherry M.D.   On: 11/15/2024 13:46   DG Chest 1  View Result Date: 11/15/2024 CLINICAL DATA:  Pain.  Recent fall. EXAM: CHEST  1 VIEW COMPARISON:  09/18/2024. FINDINGS: The heart size  and mediastinal contours are within normal limits. Aortic atherosclerosis. 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 Median sternotomy. No focal consolidation, pleural effusion, or pneumothorax. No acute osseous abnormality. IMPRESSION: No acute cardiopulmonary findings. Electronically Signed   By: Harrietta Sherry M.D.   On: 11/15/2024 13:45   DG Forearm Left Result Date: 11/15/2024 CLINICAL DATA:  Pain.  Recent fall. EXAM: LEFT FOREARM - 2 VIEW COMPARISON:  None Available. FINDINGS: No evidence of acute fracture. Well corticated ossicle along the superior aspect of the radiocapitellar joint may relate to prior trauma. Soft tissues are unremarkable. IMPRESSION: No acute osseous abnormality. Electronically Signed   By: Harrietta Sherry M.D.   On: 11/15/2024 13:42   DG Shoulder Left Result Date: 11/15/2024 CLINICAL DATA:  Status post fall with left shoulder pain EXAM: LEFT SHOULDER - 3 VIEW COMPARISON:  None Available. FINDINGS: There is no evidence of fracture or dislocation. Mild superior subluxation of the humeral head relative to the glenoid. Moderate degenerative changes of the glenohumeral and acromioclavicular joints. Soft tissues are unremarkable. Radiopaque device projects over the upper lateral left chest. Median sternotomy wires are nondisplaced. IMPRESSION: 1. No acute fracture or dislocation. 2. Moderate degenerative changes of the shoulder. Mild superior subluxation of the humeral head relative to the glenoid, which can be seen in the setting of rotator cuff pathology. Electronically Signed   By: Limin  Xu M.D.   On: 11/15/2024 12:19     Assessment and plan- Patient is a 88 y.o. male ***   Visit Diagnosis 1. Erythropoietin (EPO) stimulating agent anemia management patient   2. Anemia in stage 4 chronic kidney disease (HCC)      Dr. Annah Skene, MD, MPH [facility] [phone] 11/23/2024 3:35 PM                   [1] No Known Allergies [2]   Current Outpatient Medications:    amLODipine  (NORVASC ) 5 MG tablet, Take 1 tablet (5 mg total) by mouth 2 (two) times daily., Disp: , Rfl:    amoxicillin -clavulanate (AUGMENTIN ) 875-125 MG tablet, Take 1 tablet by mouth 2 (two) times daily., Disp: 10 tablet, Rfl: 0   atorvastatin  (LIPITOR) 40 MG tablet, Take 1 tablet (40 mg total) by mouth daily., Disp: 90 tablet, Rfl: 3   cholecalciferol  (VITAMIN D3) 25 MCG (1000 UT) tablet, Take 1,000 Units by mouth daily., Disp: , Rfl:    ferrous sulfate  325 (65 FE) MG EC tablet, Take 1 tablet (325 mg total) by mouth daily with breakfast., Disp: 30 tablet, Rfl: 0   isosorbide  mononitrate (IMDUR ) 60 MG 24 hr tablet, Take 60 mg by mouth daily., Disp: , Rfl:    lisinopril  (ZESTRIL ) 5 MG tablet, Take 2 tablets (10 mg total) by mouth daily., Disp: , Rfl:    pantoprazole  (PROTONIX ) 40 MG tablet, Take 1 tablet (40 mg total) by mouth daily., Disp: 30 tablet, Rfl: 0   torsemide  (DEMADEX ) 10 MG tablet, TAKE 1 TABLET BY MOUTH EVERY DAY AS NEEDED, Disp: 90 tablet, Rfl: 0   traMADol  (ULTRAM ) 50 MG tablet, Take 50 mg by mouth every 6 (six) hours as needed., Disp: , Rfl:    [Paused] aspirin  EC 81 MG tablet, Take 81 mg by mouth daily., Disp: , Rfl:    polyethylene glycol (MIRALAX ) 17 g packet, Take 17 g by mouth daily. (Patient  not taking: Reported on 11/15/2024), Disp: , Rfl:    tamsulosin  (FLOMAX ) 0.4 MG CAPS capsule, Take 1 capsule (0.4 mg total) by mouth daily. (Patient not taking: Reported on 09/18/2024), Disp: 30 capsule, Rfl: 11  "

## 2024-11-24 NOTE — Anesthesia Postprocedure Evaluation (Signed)
"   Anesthesia Post Note  Patient: Rodney Wiley  Procedure(s) Performed: EGD (ESOPHAGOGASTRODUODENOSCOPY)  Patient location during evaluation: Endoscopy Anesthesia Type: General Level of consciousness: awake and alert Pain management: pain level controlled Vital Signs Assessment: post-procedure vital signs reviewed and stable Respiratory status: spontaneous breathing, nonlabored ventilation, respiratory function stable and patient connected to nasal cannula oxygen Cardiovascular status: blood pressure returned to baseline and stable Postop Assessment: no apparent nausea or vomiting Anesthetic complications: no   No notable events documented.   Last Vitals:  Vitals:   11/18/24 0435 11/18/24 0826  BP: (!) 142/87 (!) 178/91  Pulse: 66 82  Resp: 17 16  Temp: (!) 36.3 C   SpO2: 100% 100%    Last Pain:  Vitals:   11/18/24 0834  TempSrc:   PainSc: 3                  Prentice Murphy      "

## 2024-11-25 ENCOUNTER — Encounter: Payer: Self-pay | Admitting: Oncology

## 2024-11-27 ENCOUNTER — Ambulatory Visit

## 2024-12-03 ENCOUNTER — Inpatient Hospital Stay

## 2024-12-03 NOTE — Progress Notes (Deleted)
 " Cardiology Office Note   Date:  12/03/2024  ID:  Rodney Wiley, DOB [birth year], MRN [id number] PCP: Rodney Channing SQUIBB, FNP  [facility] Providers Cardiologist:  Lonni Hanson, MD Electrophysiologist:  OLE ONEIDA HOLTS, MD { Click to update primary MD,subspecialty MD or APP then REFRESH:1}    History of Present Illness Rodney Wiley is a 88 y.o. male with history of CAD s/p CABG in 2009, thoracic aortic aneurysm and aortic regurgitation status post Bentall procedure with bio prosthetic aortic valve replacement at the time of CABG, chronic HFpEF, SVT, frequent PVCs, resistant hypertension, CKD stage IV with possible left renal artery stenosis presents for follow-up.   The patient underwent CABG and bioprosthetic AVR in 02/2018 with LIMA to LAD, SVG to OM2 and SVG to PDA. 24mm synthetic aortic root graft and 21mm Edwards Inspiris Roselia stented bovine pericardial tissue valve was used. EF shortly after was 50-55%, G2DD. Heart monitor 2020 showed NSR, PACs, frequent PVCs and runs of atrial tachycardia, PVC burden 8.4%. The patient was started on amiodarone . BB was stopped due to 1st degree AV block. Echo 02/2023 showed normal LVEF 55-60%, mild LVH, G1DD, normal mitral valve, aortic valve mean gradient 18.55mmHg.   The patient presented to the ER on 07/26/2023 with weakness, lightheadedness, elevated blood pressures, nausea and diaphoresis for the last few weeks.  High-sensitivity troponin elevated to 26.  EKG was nonacute.  Echo showed normal LV function, grade 2 diastolic dysfunction, moderate MR. Telemetry was unremarkable. Blood  Pressure medications were adjusted.   He was admitted at [facility] in September 2024 with hyponatremia secondary to SIADH, dyspnea, dysphagia with severe GERD (nausea and vomiting), normocytic anemia. He underwent extensive work-up to r/o underlying malignancy resulting in SIADGE. Barium swallow showed severe GERD and esophageal dysmotility, PPI was increased.     Patient was last seen 08/01/2024 for labile blood pressures.  Blood pressure was 110/90.  He has been taking lisinopril  in the morning and Imdur  at night.  Lisinopril  was increased to 10 mg daily.   Patient was admitted to [facility] early September 2025 for lower GI bleed.  Hemoglobin trended 9.4>8.3.  Upper and lower endoscopy noted significant diverticular processes in the lower bowel as well as hemorrhoids.  Hemoglobin stabilized.  The patient was admitted in October 1025 with syncope in the setting of UTI, back pain, taking meds on an empty stomach. Noted to be braydcardic and hypotensive with rates in the 40s with profound 1st degree AV block. Not on AV nodal blocking agents. Heart monitor was ordered, but not completed.   ROS: ***  Studies Reviewed      *** Risk Assessment/Calculations {Does this patient have ATRIAL FIBRILLATION?:[phone]} No BP recorded.  {Refresh Note OR Click here to enter BP  :1}***       Physical Exam VS:  There were no vitals taken for this visit.       Wt Readings from Last 3 Encounters:  11/23/24 134 lb 14.4 oz (61.2 kg)  11/15/24 130 lb (59 kg)  09/18/24 138 lb 14.2 oz (63 kg)    GEN: Well nourished, well developed in no acute distress NECK: No JVD; No carotid bruits CARDIAC: ***RRR, no murmurs, rubs, gallops RESPIRATORY:  Clear to auscultation without rales, wheezing or rhonchi  ABDOMEN: Soft, non-tender, non-distended EXTREMITIES:  No edema; No deformity   ASSESSMENT AND PLAN ***    {Are you ordering a CV Procedure (e.g. stress test, cath, DCCV, TEE, etc)?   Press F2        :  [phone]}  Dispo: ***  Signed, Brookelle Pellicane VEAR Fishman, PA-C   "

## 2024-12-04 ENCOUNTER — Ambulatory Visit: Attending: Medical | Admitting: Medical

## 2024-12-07 ENCOUNTER — Inpatient Hospital Stay

## 2024-12-07 ENCOUNTER — Inpatient Hospital Stay: Attending: Oncology

## 2024-12-08 ENCOUNTER — Other Ambulatory Visit: Payer: Self-pay | Admitting: Oncology

## 2024-12-10 ENCOUNTER — Encounter: Payer: Self-pay | Admitting: Oncology

## 2024-12-17 ENCOUNTER — Inpatient Hospital Stay

## 2024-12-21 ENCOUNTER — Inpatient Hospital Stay

## 2024-12-31 ENCOUNTER — Inpatient Hospital Stay

## 2025-01-04 ENCOUNTER — Inpatient Hospital Stay

## 2025-01-18 ENCOUNTER — Inpatient Hospital Stay

## 2025-02-01 ENCOUNTER — Inpatient Hospital Stay

## 2025-02-15 ENCOUNTER — Inpatient Hospital Stay

## 2025-03-01 ENCOUNTER — Inpatient Hospital Stay

## 2025-03-01 ENCOUNTER — Inpatient Hospital Stay: Admitting: Oncology
